# Patient Record
Sex: Male | Born: 1935 | Race: White | Hispanic: No | Marital: Married | Smoking: Former smoker
Health system: Southern US, Community
[De-identification: ages and names within clinical notes are randomized; demographics above are authoritative.]

## PROBLEM LIST (undated history)

## (undated) DIAGNOSIS — N183 Chronic kidney disease, stage 3 unspecified: Secondary | ICD-10-CM

## (undated) DIAGNOSIS — I509 Heart failure, unspecified: Secondary | ICD-10-CM

## (undated) DIAGNOSIS — I219 Acute myocardial infarction, unspecified: Secondary | ICD-10-CM

## (undated) DIAGNOSIS — E785 Hyperlipidemia, unspecified: Secondary | ICD-10-CM

## (undated) DIAGNOSIS — Z9981 Dependence on supplemental oxygen: Secondary | ICD-10-CM

## (undated) DIAGNOSIS — I2510 Atherosclerotic heart disease of native coronary artery without angina pectoris: Secondary | ICD-10-CM

## (undated) DIAGNOSIS — Z86010 Personal history of colonic polyps: Secondary | ICD-10-CM

## (undated) DIAGNOSIS — F419 Anxiety disorder, unspecified: Secondary | ICD-10-CM

## (undated) DIAGNOSIS — J449 Chronic obstructive pulmonary disease, unspecified: Secondary | ICD-10-CM

## (undated) DIAGNOSIS — Z8711 Personal history of peptic ulcer disease: Secondary | ICD-10-CM

## (undated) DIAGNOSIS — N200 Calculus of kidney: Secondary | ICD-10-CM

## (undated) DIAGNOSIS — C329 Malignant neoplasm of larynx, unspecified: Secondary | ICD-10-CM

## (undated) DIAGNOSIS — Z8719 Personal history of other diseases of the digestive system: Secondary | ICD-10-CM

## (undated) DIAGNOSIS — J189 Pneumonia, unspecified organism: Secondary | ICD-10-CM

## (undated) DIAGNOSIS — J984 Other disorders of lung: Secondary | ICD-10-CM

## (undated) DIAGNOSIS — K219 Gastro-esophageal reflux disease without esophagitis: Secondary | ICD-10-CM

## (undated) DIAGNOSIS — K5730 Diverticulosis of large intestine without perforation or abscess without bleeding: Secondary | ICD-10-CM

## (undated) DIAGNOSIS — I10 Essential (primary) hypertension: Secondary | ICD-10-CM

## (undated) DIAGNOSIS — Z800 Family history of malignant neoplasm of digestive organs: Secondary | ICD-10-CM

## (undated) DIAGNOSIS — K900 Celiac disease: Secondary | ICD-10-CM

## (undated) DIAGNOSIS — Z8489 Family history of other specified conditions: Secondary | ICD-10-CM

## (undated) HISTORY — DX: Chronic obstructive pulmonary disease, unspecified: J44.9

## (undated) HISTORY — PX: EPIGLOTOPLASTY W/ MLB: SHX1519

## (undated) HISTORY — PX: VASECTOMY: SHX75

## (undated) HISTORY — DX: Diverticulosis of large intestine without perforation or abscess without bleeding: K57.30

## (undated) HISTORY — DX: Essential (primary) hypertension: I10

## (undated) HISTORY — DX: Family history of malignant neoplasm of digestive organs: Z80.0

## (undated) HISTORY — DX: Gastro-esophageal reflux disease without esophagitis: K21.9

## (undated) HISTORY — DX: Celiac disease: K90.0

## (undated) HISTORY — PX: TONSILLECTOMY AND ADENOIDECTOMY: SUR1326

## (undated) HISTORY — DX: Other disorders of lung: J98.4

## (undated) HISTORY — DX: Personal history of colonic polyps: Z86.010

## (undated) HISTORY — DX: Hyperlipidemia, unspecified: E78.5

## (undated) HISTORY — DX: Atherosclerotic heart disease of native coronary artery without angina pectoris: I25.10

## (undated) HISTORY — PX: EXCISIONAL HEMORRHOIDECTOMY: SHX1541

---
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
## 1998-10-09 ENCOUNTER — Encounter: Payer: Self-pay | Admitting: Emergency Medicine

## 1998-10-09 ENCOUNTER — Ambulatory Visit: Admission: RE | Admit: 1998-10-09 | Discharge: 1998-10-09 | Payer: Self-pay | Admitting: Emergency Medicine

## 1998-10-09 ENCOUNTER — Emergency Department (HOSPITAL_COMMUNITY): Admission: EM | Admit: 1998-10-09 | Discharge: 1998-10-09 | Payer: Self-pay | Admitting: Emergency Medicine

## 1999-06-05 ENCOUNTER — Emergency Department (HOSPITAL_COMMUNITY): Admission: EM | Admit: 1999-06-05 | Discharge: 1999-06-05 | Payer: Self-pay | Admitting: Emergency Medicine

## 1999-09-27 ENCOUNTER — Encounter: Payer: Self-pay | Admitting: Emergency Medicine

## 1999-09-27 ENCOUNTER — Inpatient Hospital Stay (HOSPITAL_COMMUNITY): Admission: EM | Admit: 1999-09-27 | Discharge: 1999-09-29 | Payer: Self-pay | Admitting: Emergency Medicine

## 1999-09-30 ENCOUNTER — Inpatient Hospital Stay (HOSPITAL_COMMUNITY): Admission: EM | Admit: 1999-09-30 | Discharge: 1999-10-01 | Payer: Self-pay | Admitting: Emergency Medicine

## 2001-05-04 ENCOUNTER — Ambulatory Visit (HOSPITAL_COMMUNITY): Admission: RE | Admit: 2001-05-04 | Discharge: 2001-05-04 | Payer: Self-pay | Admitting: Cardiology

## 2001-05-04 HISTORY — PX: CARDIAC CATHETERIZATION: SHX172

## 2004-05-06 ENCOUNTER — Ambulatory Visit (HOSPITAL_COMMUNITY): Admission: RE | Admit: 2004-05-06 | Discharge: 2004-05-06 | Payer: Self-pay | Admitting: Surgery

## 2004-05-06 ENCOUNTER — Encounter (INDEPENDENT_AMBULATORY_CARE_PROVIDER_SITE_OTHER): Payer: Self-pay | Admitting: Specialist

## 2004-07-20 ENCOUNTER — Ambulatory Visit: Payer: Self-pay | Admitting: Pulmonary Disease

## 2004-07-26 ENCOUNTER — Emergency Department (HOSPITAL_COMMUNITY): Admission: EM | Admit: 2004-07-26 | Discharge: 2004-07-26 | Payer: Self-pay | Admitting: Emergency Medicine

## 2004-07-28 ENCOUNTER — Ambulatory Visit: Payer: Self-pay | Admitting: Pulmonary Disease

## 2004-08-04 ENCOUNTER — Ambulatory Visit: Payer: Self-pay | Admitting: Pulmonary Disease

## 2004-08-19 ENCOUNTER — Ambulatory Visit: Payer: Self-pay | Admitting: Pulmonary Disease

## 2004-09-03 ENCOUNTER — Ambulatory Visit: Payer: Self-pay | Admitting: Pulmonary Disease

## 2004-11-12 ENCOUNTER — Ambulatory Visit: Payer: Self-pay | Admitting: Pulmonary Disease

## 2004-12-02 ENCOUNTER — Ambulatory Visit: Payer: Self-pay | Admitting: Internal Medicine

## 2004-12-07 ENCOUNTER — Ambulatory Visit: Payer: Self-pay | Admitting: Internal Medicine

## 2005-02-10 ENCOUNTER — Ambulatory Visit: Payer: Self-pay | Admitting: Pulmonary Disease

## 2005-04-19 ENCOUNTER — Ambulatory Visit: Payer: Self-pay | Admitting: Internal Medicine

## 2005-05-13 ENCOUNTER — Ambulatory Visit: Payer: Self-pay | Admitting: Pulmonary Disease

## 2005-05-17 ENCOUNTER — Emergency Department (HOSPITAL_COMMUNITY): Admission: EM | Admit: 2005-05-17 | Discharge: 2005-05-18 | Payer: Self-pay | Admitting: *Deleted

## 2005-06-03 ENCOUNTER — Ambulatory Visit: Payer: Self-pay | Admitting: Internal Medicine

## 2005-08-13 ENCOUNTER — Ambulatory Visit: Payer: Self-pay | Admitting: Pulmonary Disease

## 2005-11-01 ENCOUNTER — Ambulatory Visit: Payer: Self-pay | Admitting: Internal Medicine

## 2005-11-03 ENCOUNTER — Ambulatory Visit: Payer: Self-pay | Admitting: Pulmonary Disease

## 2005-11-12 ENCOUNTER — Ambulatory Visit: Payer: Self-pay | Admitting: Pulmonary Disease

## 2005-11-23 ENCOUNTER — Ambulatory Visit: Payer: Self-pay | Admitting: Internal Medicine

## 2006-01-06 ENCOUNTER — Ambulatory Visit: Payer: Self-pay | Admitting: Pulmonary Disease

## 2006-04-06 ENCOUNTER — Ambulatory Visit: Payer: Self-pay | Admitting: Pulmonary Disease

## 2006-04-21 ENCOUNTER — Ambulatory Visit: Payer: Self-pay | Admitting: Pulmonary Disease

## 2006-05-25 ENCOUNTER — Emergency Department (HOSPITAL_COMMUNITY): Admission: EM | Admit: 2006-05-25 | Discharge: 2006-05-25 | Payer: Self-pay | Admitting: Emergency Medicine

## 2006-07-07 ENCOUNTER — Ambulatory Visit: Payer: Self-pay | Admitting: Pulmonary Disease

## 2006-07-11 ENCOUNTER — Ambulatory Visit: Payer: Self-pay | Admitting: Pulmonary Disease

## 2006-09-01 ENCOUNTER — Ambulatory Visit: Payer: Self-pay | Admitting: Internal Medicine

## 2006-09-01 LAB — CONVERTED CEMR LAB
Bacteria, U Microscopic: NEGATIVE /hpf
Epithelial cells, urine: NEGATIVE /lpf
Hemoglobin, Urine: NEGATIVE
Mucus, UA: NEGATIVE
Total Protein, Urine: NEGATIVE mg/dL
pH: 6 (ref 5.0–8.0)

## 2006-09-09 ENCOUNTER — Ambulatory Visit: Payer: Self-pay | Admitting: Internal Medicine

## 2006-09-09 LAB — CONVERTED CEMR LAB
AST: 19 units/L (ref 0–37)
Bilirubin, Direct: 0.2 mg/dL (ref 0.0–0.3)
Cholesterol: 152 mg/dL (ref 0–200)
HDL: 40.2 mg/dL (ref >39.0)
Total Bilirubin: 1.2 mg/dL (ref 0.3–1.2)
Total Protein: 6.2 g/dL (ref 6.0–8.3)
Triglycerides: 105 mg/dL (ref 0–149)
VLDL: 21 mg/dL (ref 0–40)

## 2006-10-06 ENCOUNTER — Ambulatory Visit: Payer: Self-pay | Admitting: Pulmonary Disease

## 2006-10-07 ENCOUNTER — Ambulatory Visit: Payer: Self-pay | Admitting: Cardiology

## 2006-11-08 ENCOUNTER — Ambulatory Visit: Payer: Self-pay | Admitting: Pulmonary Disease

## 2006-11-17 ENCOUNTER — Ambulatory Visit: Payer: Self-pay | Admitting: Pulmonary Disease

## 2006-11-24 ENCOUNTER — Ambulatory Visit: Payer: Self-pay | Admitting: Pulmonary Disease

## 2007-01-05 ENCOUNTER — Ambulatory Visit: Payer: Self-pay | Admitting: Internal Medicine

## 2007-01-06 ENCOUNTER — Ambulatory Visit: Payer: Self-pay | Admitting: Pulmonary Disease

## 2007-03-30 ENCOUNTER — Ambulatory Visit: Payer: Self-pay | Admitting: Emergency Medicine

## 2007-04-13 ENCOUNTER — Ambulatory Visit: Payer: Self-pay | Admitting: Gastroenterology

## 2007-04-26 ENCOUNTER — Encounter: Payer: Self-pay | Admitting: Gastroenterology

## 2007-04-26 ENCOUNTER — Ambulatory Visit: Payer: Self-pay | Admitting: Gastroenterology

## 2007-04-26 DIAGNOSIS — Z860100 Personal history of colon polyps, unspecified: Secondary | ICD-10-CM

## 2007-04-26 HISTORY — DX: Personal history of colonic polyps: Z86.010

## 2007-04-26 HISTORY — DX: Personal history of colon polyps, unspecified: Z86.0100

## 2007-05-19 ENCOUNTER — Ambulatory Visit: Payer: Self-pay | Admitting: Pulmonary Disease

## 2007-07-06 DIAGNOSIS — J449 Chronic obstructive pulmonary disease, unspecified: Secondary | ICD-10-CM

## 2007-07-06 DIAGNOSIS — J45909 Unspecified asthma, uncomplicated: Secondary | ICD-10-CM | POA: Insufficient documentation

## 2007-07-06 DIAGNOSIS — K219 Gastro-esophageal reflux disease without esophagitis: Secondary | ICD-10-CM

## 2007-08-11 ENCOUNTER — Ambulatory Visit: Payer: Self-pay | Admitting: Emergency Medicine

## 2007-08-11 DIAGNOSIS — R918 Other nonspecific abnormal finding of lung field: Secondary | ICD-10-CM

## 2007-08-11 DIAGNOSIS — B370 Candidal stomatitis: Secondary | ICD-10-CM

## 2007-08-11 DIAGNOSIS — R059 Cough, unspecified: Secondary | ICD-10-CM | POA: Insufficient documentation

## 2007-08-11 DIAGNOSIS — R05 Cough: Secondary | ICD-10-CM

## 2007-09-05 ENCOUNTER — Encounter: Payer: Self-pay | Admitting: Internal Medicine

## 2007-09-18 ENCOUNTER — Ambulatory Visit: Payer: Self-pay | Admitting: Emergency Medicine

## 2007-09-18 DIAGNOSIS — J019 Acute sinusitis, unspecified: Secondary | ICD-10-CM

## 2007-09-29 ENCOUNTER — Encounter: Payer: Self-pay | Admitting: Emergency Medicine

## 2007-10-05 ENCOUNTER — Ambulatory Visit: Payer: Self-pay | Admitting: Emergency Medicine

## 2007-10-05 DIAGNOSIS — T7841XA Arthus phenomenon, initial encounter: Secondary | ICD-10-CM | POA: Insufficient documentation

## 2007-10-05 LAB — CONVERTED CEMR LAB
BUN: 16 mg/dL (ref 6–23)
Creatinine, Ser: 1.1 mg/dL (ref 0.4–1.5)
GFR calc Af Amer: 85 mL/min
GFR calc non Af Amer: 70 mL/min
Potassium: 4 meq/L (ref 3.5–5.1)
Sodium: 141 meq/L (ref 135–145)

## 2007-10-09 ENCOUNTER — Ambulatory Visit: Payer: Self-pay | Admitting: Cardiovascular Disease

## 2007-10-17 ENCOUNTER — Telehealth (INDEPENDENT_AMBULATORY_CARE_PROVIDER_SITE_OTHER): Payer: Self-pay | Admitting: *Deleted

## 2007-10-30 ENCOUNTER — Ambulatory Visit: Payer: Self-pay | Admitting: Emergency Medicine

## 2007-10-30 DIAGNOSIS — J441 Chronic obstructive pulmonary disease with (acute) exacerbation: Secondary | ICD-10-CM | POA: Insufficient documentation

## 2007-12-11 ENCOUNTER — Ambulatory Visit: Payer: Self-pay | Admitting: Emergency Medicine

## 2008-03-07 ENCOUNTER — Telehealth: Payer: Self-pay | Admitting: Emergency Medicine

## 2008-04-01 ENCOUNTER — Ambulatory Visit: Payer: Self-pay | Admitting: Emergency Medicine

## 2008-04-01 LAB — CONVERTED CEMR LAB
Calcium: 9.1 mg/dL (ref 8.4–10.5)
GFR calc Af Amer: 85 mL/min
GFR calc non Af Amer: 70 mL/min
Potassium: 4.2 meq/L (ref 3.5–5.1)
Sodium: 144 meq/L (ref 135–145)

## 2008-04-05 ENCOUNTER — Ambulatory Visit: Payer: Self-pay | Admitting: Cardiovascular Disease

## 2008-04-10 ENCOUNTER — Ambulatory Visit: Payer: Self-pay | Admitting: Emergency Medicine

## 2008-05-06 ENCOUNTER — Telehealth (INDEPENDENT_AMBULATORY_CARE_PROVIDER_SITE_OTHER): Payer: Self-pay | Admitting: *Deleted

## 2008-05-20 ENCOUNTER — Ambulatory Visit: Payer: Self-pay | Admitting: Internal Medicine

## 2008-05-22 ENCOUNTER — Telehealth: Payer: Self-pay | Admitting: Emergency Medicine

## 2008-05-23 ENCOUNTER — Telehealth: Payer: Self-pay | Admitting: Emergency Medicine

## 2008-06-07 ENCOUNTER — Ambulatory Visit: Payer: Self-pay | Admitting: Emergency Medicine

## 2008-07-30 ENCOUNTER — Ambulatory Visit: Payer: Self-pay | Admitting: Emergency Medicine

## 2008-09-30 ENCOUNTER — Ambulatory Visit: Payer: Self-pay | Admitting: Emergency Medicine

## 2008-09-30 LAB — CONVERTED CEMR LAB
Chloride: 102 meq/L (ref 96–112)
GFR calc Af Amer: 77 mL/min
GFR calc non Af Amer: 63 mL/min
Potassium: 4.4 meq/L (ref 3.5–5.1)

## 2008-10-07 ENCOUNTER — Ambulatory Visit: Payer: Self-pay | Admitting: Cardiology

## 2009-01-15 ENCOUNTER — Ambulatory Visit: Payer: Self-pay | Admitting: Emergency Medicine

## 2009-02-10 ENCOUNTER — Telehealth (INDEPENDENT_AMBULATORY_CARE_PROVIDER_SITE_OTHER): Payer: Self-pay | Admitting: *Deleted

## 2009-04-14 ENCOUNTER — Telehealth: Payer: Self-pay | Admitting: Emergency Medicine

## 2009-04-15 ENCOUNTER — Telehealth (INDEPENDENT_AMBULATORY_CARE_PROVIDER_SITE_OTHER): Payer: Self-pay | Admitting: *Deleted

## 2009-07-07 ENCOUNTER — Ambulatory Visit: Payer: Self-pay | Admitting: Emergency Medicine

## 2009-08-04 ENCOUNTER — Ambulatory Visit: Payer: Self-pay | Admitting: Internal Medicine

## 2009-09-10 ENCOUNTER — Ambulatory Visit: Payer: Self-pay | Admitting: Emergency Medicine

## 2009-09-10 ENCOUNTER — Encounter: Payer: Self-pay | Admitting: Emergency Medicine

## 2009-09-10 DIAGNOSIS — J329 Chronic sinusitis, unspecified: Secondary | ICD-10-CM | POA: Insufficient documentation

## 2009-09-17 ENCOUNTER — Telehealth (INDEPENDENT_AMBULATORY_CARE_PROVIDER_SITE_OTHER): Payer: Self-pay | Admitting: *Deleted

## 2009-09-26 ENCOUNTER — Telehealth (INDEPENDENT_AMBULATORY_CARE_PROVIDER_SITE_OTHER): Payer: Self-pay | Admitting: *Deleted

## 2009-09-30 HISTORY — PX: CARDIAC CATHETERIZATION: SHX172

## 2009-10-10 ENCOUNTER — Ambulatory Visit: Payer: Self-pay | Admitting: Emergency Medicine

## 2009-10-23 ENCOUNTER — Telehealth (INDEPENDENT_AMBULATORY_CARE_PROVIDER_SITE_OTHER): Payer: Self-pay | Admitting: *Deleted

## 2010-01-26 ENCOUNTER — Ambulatory Visit: Payer: Self-pay | Admitting: Emergency Medicine

## 2010-03-13 ENCOUNTER — Ambulatory Visit: Payer: Self-pay | Admitting: Pulmonary Disease

## 2010-04-03 ENCOUNTER — Ambulatory Visit: Payer: Self-pay | Admitting: Emergency Medicine

## 2010-07-22 ENCOUNTER — Ambulatory Visit: Payer: Self-pay | Admitting: Emergency Medicine

## 2010-08-04 ENCOUNTER — Inpatient Hospital Stay (HOSPITAL_COMMUNITY)
Admission: EM | Admit: 2010-08-04 | Discharge: 2010-08-07 | Payer: Self-pay | Source: Home / Self Care | Attending: Internal Medicine | Admitting: Internal Medicine

## 2010-08-07 ENCOUNTER — Encounter (INDEPENDENT_AMBULATORY_CARE_PROVIDER_SITE_OTHER): Payer: Self-pay | Admitting: Internal Medicine

## 2010-08-19 ENCOUNTER — Ambulatory Visit
Admission: RE | Admit: 2010-08-19 | Discharge: 2010-08-19 | Payer: Self-pay | Source: Home / Self Care | Attending: Emergency Medicine | Admitting: Emergency Medicine

## 2010-08-19 ENCOUNTER — Ambulatory Visit: Payer: Self-pay | Admitting: Emergency Medicine

## 2010-09-08 ENCOUNTER — Ambulatory Visit
Admission: RE | Admit: 2010-09-08 | Discharge: 2010-09-08 | Payer: Self-pay | Source: Home / Self Care | Attending: Emergency Medicine | Admitting: Emergency Medicine

## 2010-09-13 ENCOUNTER — Encounter: Payer: Self-pay | Admitting: Emergency Medicine

## 2010-09-24 NOTE — Progress Notes (Signed)
Summary: results  Phone Note Call from Patient Call back at [phone]   Caller: Patient Call For: byrum Summary of Call: xray results Initial call taken by: Gustavus Bryant,  September 17, 2009 8:33 AM  Follow-up for Phone Call        Dr. Lamonte Sakai, please advise on CXR for 09/10/09. thanks.Choccolocco Bing CMA  September 17, 2009 9:00 AM   Pt called back...would like results.  Said no one called him back on 09/17/2009. Follow-up by: Zigmund Gottron,  September 18, 2009 8:21 AM  Additional Follow-up for Phone Call Additional follow up Details #1::        The patient is aware RB is out of the office. The patient does state that he is feeling better and would just like to know what the cxr showed. The patient was instructed to continue medication regimen that RB layed out for him and we will call as soon as we can get cxr results from [facility]. The patient had verbal understanding of this. Additional Follow-up by: Francesca Jewett CMA,  September 18, 2009 9:19 AM    Additional Follow-up for Phone Call Additional follow up Details #2::    pt still have not gotten results from xray  CXR is stable from last time, no changes. Collene Gobble MD  September 25, 2009 5:00 PM   Spoke with pt and advised results/recs per RB.  Pt verbalized understanding. Follow-up by: Tilden Dome,  September 25, 2009 5:17 PM

## 2010-09-24 NOTE — Assessment & Plan Note (Signed)
Summary: COPD, sinusitis, cough   Visit Type:  Follow-up Primary Provider/Referring Provider:  Dr Nino Glow  CC:  Patient is here for sinusitis follow-up. He was seen by Dr. Halford Chessman on 03/13/2010.  The patient says his sinuses are better and he can breathe. No change in his cough. Patient is on Doxy for dermatitis by PCP. Marland Kitchen  History of Present Illness: [name] is a 76 year old gentleman with a history of COPD, remote laryngeal cancer with some dysphagia, allergic rhinitis, and esophageal reflux. He has also been followed for chronic nodular granulomatous disease and bronchiectasis on CT scan chest, stable on CT for 2 + yrs.    ROV 09/10/09 -- Seen a month ago for acute bronchitis, had also been treated in Nov for prod cough, treated w abx. Was taking [place] but gave up on them. Still with colored mucous, green with blood streaks. Uses SABA rarely.   ROV 10/10/09 -- Returns for f/u. Finishing Augmentin today (4 week course), doing nasal saline washes, clarinex, mucinex. Breathing well, using Advair two times a day. Nasal gtt better, cough better.   January 26, 2010--Presents for an acute office visit. Complains of prod cough with green/yellow/brown mucus, wheezing, increased SOB x10days . OTC not helping. Denies chest pain, orthopnea, hemoptysis, fever, n/v/d, edema, headache.   March 13, 2010 -- symptoms for couple weeks, cough with sputum, worse at night, hoarse, gurgle in throat/upper chest, creamy sputum, no hemoptysis, sinuses bad, no fever, occ wheeze, nasonex at bedtime, proair one per week, sinus solution once daily, clarinex once daily.  ROV 04/03/10 -- returns for f/u COPD, allergic rhinitis, GERD, hx laryngeal CA. Recent treatment for acute sinusitis with Augmentin, also added atrovent NS to his other allergy regimen. He feels better, HA improved, mucous is now clear. Breathing has been good. Recently started on doxycycline to treat a dermatitis.   Current Medications (verified): 1)  Advair  Diskus 250-50 Mcg/dose  Misc (Fluticasone-Salmeterol) .... Inhale 1 Puff Two Times A Day 2)  Singulair 10 Mg  Tabs (Montelukast Sodium) .... Take 1 Tablet By Mouth Once A Day 3)  Proair Hfa 108 (90 Base) Mcg/act Aers (Albuterol Sulfate) .... Inhale 2 Puffs Every 4 Hours As Needed 4)  Nasonex 50 Mcg/act  Susp (Mometasone Furoate) .... As Needed 5)  Adult Aspirin Low Strength 81 Mg  Tbdp (Aspirin) .... Take 1 Tablet By Mouth Once A Day 6)  Vitamin C 1000 Mg  Tabs (Ascorbic Acid) .... Take 1 Tablet By Mouth Twice A Day 7)  Prevacid 24hr 15 Mg Cpdr (Lansoprazole) .Marland Kitchen.. 1 By Mouth Two Times A Day 8)  Fish Oil 1000 Mg  Caps (Omega-3 Fatty Acids) .... Take 1 Tablet By Mouth Twice A Day 9)  Diovan 80 Mg Tabs (Valsartan) .Marland Kitchen.. 1 By Mouth Once Daily 10)  Crestor 20 Mg  Tabs (Rosuvastatin Calcium) .... Take 1 Tablet By Mouth Once A Day 11)  Mucinex Dm 30-600 Mg  Tb12 (Dextromethorphan-Guaifenesin) .Marland Kitchen.. 1-2 Tabs Every 12 Hours As Needed 12)  Clarinex 5 Mg Tabs (Desloratadine) .... Once Daily 13)  Hydromet 5-1.5 Mg/9m Syrp (Hydrocodone-Homatropine) ..Marland Kitchen. 1-2 Tsp Every 4-6 Hr As Needed Cough 14)  Atrovent 0.03 % Soln (Ipratropium Bromide) .... 2 Sprays Each Nostril Two Times A Day As Needed 15)  Doxycycline Hyclate 100 Mg Tabs (Doxycycline Hyclate) ..Marland Kitchen. 1 By Mouth Two Times A Day X 10 Days  Allergies (verified): 1)  ! Codeine  Vital Signs:  Patient profile:   76year old male Height:  67 inches (170.18 cm) Weight:      145 pounds (65.91 kg) BMI:     22.79 O2 Sat:      91 % on Room air Temp:     97.5 degrees F (36.39 degrees C) oral Pulse rate:   93 / minute BP sitting:   110 / 76  (left arm) Cuff size:   regular  Vitals Entered By: Francesca Jewett CMA (April 03, 2010 9:56 AM)  O2 Sat at Rest %:  91 O2 Flow:  Room air CC: Patient is here for sinusitis follow-up. He was seen by Dr. Halford Chessman on 03/13/2010.  The patient says his sinuses are better and he can breathe. No change in his cough. Patient is on  Doxy for dermatitis by PCP.  Comments Medications reviewed with the patient. Daytime phone verified. Francesca Jewett CMA  April 03, 2010 9:56 AM   Physical Exam  General:  well developed, well nourished, in no acute distress Head:  normocephalic and atraumatic Nose:  clear nasal discharge, no sinus tenderness Mouth:  mild erythema Neck:  no JVD.   Chest Wall:  no deformities noted Lungs:  Clear, no wheeze Heart:  regular rate and rhythm, S1, S2 without murmurs, rubs, gallops, or clicks Abdomen:  not examined Msk:  no deformity or scoliosis noted with normal posture Extremities:  no clubbing, cyanosis, edema, or deformity noted Neurologic:  nonfocal Cervical Nodes:  no significant adenopathy Psych:  alert and cooperative; normal mood and affect; normal attention span and concentration   Impression & Recommendations:  Problem # 1:  COPD UNSPECIFIED (ICD-496) - Advair two times a day - ProAir as needed   Problem # 2:  ACUTE SINUSITIS, UNSPECIFIED (ICD-461.9)  Improved - continue allergy regimen  Orders: Est. Patient Level IV (72536)  Medications Added to Medication List This Visit: 1)  Doxycycline Hyclate 100 Mg Tabs (Doxycycline hyclate) .Marland Kitchen.. 1 by mouth two times a day x 10 days  Patient Instructions: 1)  Please continue your Advair two times a day and ProAir as needed  2)  Continue your clarinex once daily, nasonex once daily as needed, nasal saline washes once daily  3)  Atrovent nasal spray as needed  4)  Follow up in 3 months or as needed

## 2010-09-24 NOTE — Assessment & Plan Note (Signed)
Summary: Bruce Travis pt/gurgling, choking at night, gets up really bad cough...   Visit Type:  Acute visit Primary Provider/Referring Provider:  Dr Nino Glow  CC:  The patient c/o increased mucus production with cough at night when lying down. Mucus is creamy. Cough and sob during the day. Dr. Agustina Caroli patient..  History of Present Illness: Bruce Travis is a 76 year old gentleman with a history of COPD, remote laryngeal cancer with some dysphagia, allergic rhinitis, and esophageal reflux. He has also been followed for chronic nodular granulomatous disease and bronchiectasis on CT scan chest, stable on CT for 2 + yrs.    August 04, 2009--Presents for an acute office viist. Complains of increased SOB, prod cough with yellow/green/creamy colored mucus, wheezing x1day - Last visit w/ COPD flare , tx w/ Doxycyline x 7 days. He got better  but not has it again. OTC not helping. Worse last night. Denies chest pain,  orthopnea, hemoptysis, fever, n/v/d, edema, headache.   ROV 09/10/09 -- Seen a month ago for acute bronchitis, had also been treated in Nov for prod cough, treated w abx. Was taking [place] but gave up on them. Still with colored mucous, green with blood streaks. Uses SABA rarely.   ROV 10/10/09 -- Returns for f/u. Finishing Augmentin today (4 week course), doing nasal saline washes, clarinex, mucinex. Breathing well, using Advair two times a day. Nasal gtt better, cough better.   January 26, 2010--Presents for an acute office visit. Complains of prod cough with green/yellow/brown mucus, wheezing, increased SOB x10days . OTC not helping. Denies chest pain, orthopnea, hemoptysis, fever, n/v/d, edema, headache.   March 13, 2010 -- symptoms for couple weeks, cough with sputum, worse at night, hoarse, gurgle in throat/upper chest, creamy sputum, no hemoptysis, sinuses bad, no fever, occ wheeze, nasonex at bedtime, proair one per week, sinus solution once daily, clarinex once daily.  Current Medications  (verified): 1)  Adult Aspirin Low Strength 81 Mg  Tbdp (Aspirin) .... Take 1 Tablet By Mouth Once A Day 2)  Advair Diskus 250-50 Mcg/dose  Misc (Fluticasone-Salmeterol) .... Inhale 1 Puff Two Times A Day 3)  Vitamin C 1000 Mg  Tabs (Ascorbic Acid) .... Take 1 Tablet By Mouth Twice A Day 4)  Prevacid 24hr 15 Mg Cpdr (Lansoprazole) .Marland Kitchen.. 1 By Mouth Two Times A Day 5)  Fish Oil 1000 Mg  Caps (Omega-3 Fatty Acids) .... Take 1 Tablet By Mouth Twice A Day 6)  Diovan 80 Mg Tabs (Valsartan) .Marland Kitchen.. 1 By Mouth Once Daily 7)  Singulair 10 Mg  Tabs (Montelukast Sodium) .... Take 1 Tablet By Mouth Once A Day 8)  Crestor 20 Mg  Tabs (Rosuvastatin Calcium) .... Take 1 Tablet By Mouth Once A Day 9)  Nasonex 50 Mcg/act  Susp (Mometasone Furoate) .... As Needed 10)  Proair Hfa 108 (90 Base) Mcg/act Aers (Albuterol Sulfate) .... Inhale 2 Puffs Every 4 Hours As Needed 11)  Mucinex Dm 30-600 Mg  Tb12 (Dextromethorphan-Guaifenesin) .Marland Kitchen.. 1-2 Tabs Every 12 Hours As Needed 12)  Clarinex 5 Mg Tabs (Desloratadine) .... Once Daily 13)  Hydromet 5-1.5 Mg/57m Syrp (Hydrocodone-Homatropine) ..Marland Kitchen. 1-2 Tsp Every 4-6 Hr As Needed Cough  Allergies (verified): 1)  ! Codeine  Past History:  Past Medical History: Reviewed history from 10/30/2007 and no changes required.  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) ACUTE SINUSITIS, UNSPECIFIED (ICD-461.9) COUGH (ICD-786.2) COPD UNSPECIFIED (ICD-496) PULMONARY NODULE (ICD-518.89) CHRONIC RHINITIS (ICD-472.0) GERD (ICD-530.81) ASTHMA (ICD-493.90) CANDIDIASIS, ORAL (ICD-112.0)    Vital Signs:  Patient profile:   75  year old male Height:      67 inches (170.18 cm) Weight:      150 pounds (68.18 kg) BMI:     23.58 O2 Sat:      90 % on Room air Temp:     97.7 degrees F (36.50 degrees C) oral Pulse rate:   84 / minute BP sitting:   120 / 74  (right arm) Cuff size:   regular  Vitals Entered By: Francesca Jewett CMA (March 13, 2010 9:35 AM)  O2 Sat at Rest %:  90 O2 Flow:  Room air CC:  The patient c/o increased mucus production with cough at night when lying down. Mucus is creamy. Cough and sob during the day. Dr. Agustina Caroli patient. Comments Medications reviewed with the patient. Daytime phone verified. Francesca Jewett CMA  March 13, 2010 9:35 AM   Physical Exam  General:  well developed, well nourished, in no acute distress Nose:  clear nasal discharge, mild maxillary sinus tenderness Mouth:  mild erythema Neck:  no JVD.   Lungs:  Clear, no wheeze Heart:  regular rate and rhythm, S1, S2 without murmurs, rubs, gallops, or clicks Extremities:  no clubbing, cyanosis, edema, or deformity noted Cervical Nodes:  no significant adenopathy   Impression & Recommendations:  Problem # 1:  ACUTE SINUSITIS, UNSPECIFIED (ICD-461.9)  He has persistent sinus symptoms.  He had initial improvement with antibiotics, but his symptoms recurred once antibiotics were stopped.  Will give him another course of antibiotics.  He is to continue with is other sinus regimen, and I will add atrovent nasal spray as needed.  Depending on his response he may need imaging studies of his sinuses.  Orders: Est. Patient Level IV (82956)  Medications Added to Medication List This Visit: 1)  Augmentin 875-125 Mg Tabs (Amoxicillin-pot clavulanate) .... One by mouth two times a day 2)  Atrovent 0.03 % Soln (Ipratropium bromide) .... 2 sprays each nostril two times a day as needed  Complete Medication List: 1)  Advair Diskus 250-50 Mcg/dose Misc (Fluticasone-salmeterol) .... Inhale 1 puff two times a day 2)  Singulair 10 Mg Tabs (Montelukast sodium) .... Take 1 tablet by mouth once a day 3)  Proair Hfa 108 (90 Base) Mcg/act Aers (Albuterol sulfate) .... Inhale 2 puffs every 4 hours as needed 4)  Nasonex 50 Mcg/act Susp (Mometasone furoate) .... As needed 5)  Adult Aspirin Low Strength 81 Mg Tbdp (Aspirin) .... Take 1 tablet by mouth once a day 6)  Vitamin C 1000 Mg Tabs (Ascorbic acid) .... Take 1 tablet by  mouth twice a day 7)  Prevacid 24hr 15 Mg Cpdr (Lansoprazole) .Marland Kitchen.. 1 by mouth two times a day 8)  Fish Oil 1000 Mg Caps (Omega-3 fatty acids) .... Take 1 tablet by mouth twice a day 9)  Diovan 80 Mg Tabs (Valsartan) .Marland Kitchen.. 1 by mouth once daily 10)  Crestor 20 Mg Tabs (Rosuvastatin calcium) .... Take 1 tablet by mouth once a day 11)  Mucinex Dm 30-600 Mg Tb12 (Dextromethorphan-guaifenesin) .Marland Kitchen.. 1-2 tabs every 12 hours as needed 12)  Clarinex 5 Mg Tabs (Desloratadine) .... Once daily 13)  Hydromet 5-1.5 Mg/22m Syrp (Hydrocodone-homatropine) ..Marland Kitchen. 1-2 tsp every 4-6 hr as needed cough 14)  Augmentin 875-125 Mg Tabs (Amoxicillin-pot clavulanate) .... One by mouth two times a day 15)  Atrovent 0.03 % Soln (Ipratropium bromide) .... 2 sprays each nostril two times a day as needed  Patient Instructions: 1)  Augmentin one pill two times a day for 7  days.  If sinus symptoms not improved, then refill script for another week of antibiotics. 2)  Atrovent nasal spray two sprays two times a day as needed for runny nose 3)  Follow up with Dr. Lamonte Sakai on August 12 Prescriptions: ATROVENT 0.03 % SOLN (IPRATROPIUM BROMIDE) 2 sprays each nostril two times a day as needed  #1 x 2   Entered and Authorized by:   Chesley Mires MD   Signed by:   Chesley Mires MD on 03/13/2010   Method used:   Electronically to        [facility] [phone]* (retail)       [address], [place]  [address]       Ph: [phone]       Fax: [phone]   RxID:   [id number] AUGMENTIN 875-125 MG TABS (AMOXICILLIN-POT CLAVULANATE) one by mouth two times a day  #14 x 1   Entered and Authorized by:   Chesley Mires MD   Signed by:   Chesley Mires MD on 03/13/2010   Method used:   Electronically to        [facility] [phone]* (retail)       [address]       [place], [place]  [address]       Ph: [phone]       Fax: [phone]   RxID:   [id number]

## 2010-09-24 NOTE — Assessment & Plan Note (Signed)
Summary: Acute NP office visit - bronchitis   Primary Provider/Referring Provider:  Dr Nino Glow  CC:  prod cough with green/yellow/brown mucus, wheezing, and increased SOB x10days - denies f/c/s.  History of Present Illness: Bruce Travis is a 76 year old gentleman with a history of COPD, remote laryngeal cancer with some dysphagia, allergic rhinitis, and esophageal reflux. He has also been followed for chronic nodular granulomatous disease and bronchiectasis on CT scan chest, stable on CT for 2 + yrs.    August 04, 2009--Presents for an acute office viist. Complains of increased SOB, prod cough with yellow/green/creamy colored mucus, wheezing x1day - Last visit w/ COPD flare , tx w/ Doxycyline x 7 days. He got better  but not has it again. OTC not helping. Worse last night. Denies chest pain,  orthopnea, hemoptysis, fever, n/v/d, edema, headache.   ROV 09/10/09 -- Seen a month ago for acute bronchitis, had also been treated in Nov for prod cough, treated w abx. Was taking [place] but gave up on them. Still with colored mucous, green with blood streaks. Uses SABA rarely.   ROV 10/10/09 -- Returns for f/u. Finishing Augmentin today (4 week course), doing nasal saline washes, clarinex, mucinex. Breathing well, using Advair two times a day. Nasal gtt better, cough better.   January 26, 2010--Presents for an acute office visit. Complains of prod cough with green/yellow/brown mucus, wheezing, increased SOB x10days . OTC not helping. Denies chest pain, orthopnea, hemoptysis, fever, n/v/d, edema, headache.   Medications Prior to Update: 1)  Adult Aspirin Low Strength 81 Mg  Tbdp (Aspirin) .... Take 1 Tablet By Mouth Once A Day 2)  Advair Diskus 250-50 Mcg/dose  Misc (Fluticasone-Salmeterol) .... Inhale 1 Puff Two Times A Day 3)  Vitamin C 1000 Mg  Tabs (Ascorbic Acid) .... Take 1 Tablet By Mouth Twice A Day 4)  Prevacid 24hr 15 Mg Cpdr (Lansoprazole) .Marland Kitchen.. 1 By Mouth Two Times A Day 5)  Fish Oil 1000 Mg  Caps  (Omega-3 Fatty Acids) .... Take 1 Tablet By Mouth Twice A Day 6)  Diovan 80 Mg Tabs (Valsartan) .Marland Kitchen.. 1 By Mouth Once Daily 7)  Singulair 10 Mg  Tabs (Montelukast Sodium) .... Take 1 Tablet By Mouth Once A Day 8)  Crestor 20 Mg  Tabs (Rosuvastatin Calcium) .... Take 1 Tablet By Mouth Once A Day 9)  Nasonex 50 Mcg/act  Susp (Mometasone Furoate) .... As Needed 10)  Proair Hfa 108 (90 Base) Mcg/act Aers (Albuterol Sulfate) .... Inhale 2 Puffs Every 4 Hours As Needed 11)  Mucinex Dm 30-600 Mg  Tb12 (Dextromethorphan-Guaifenesin) .Marland Kitchen.. 1-2 Tabs Every 12 Hours As Needed 12)  Clarinex 5 Mg Tabs (Desloratadine) .... Once Daily 13)  Hydromet 5-1.5 Mg/75m Syrp (Hydrocodone-Homatropine) ..Marland Kitchen. 1-2 Tsp Every 4-6 Hr As Needed Cough 14)  Amoxicillin-Pot Clavulanate 875-125 Mg Tabs (Amoxicillin-Pot Clavulanate) ..Marland Kitchen. 1 By Mouth Two Times A Day 15)  Aciphex 20 Mg Tbec (Rabeprazole Sodium) ..Marland Kitchen. 1 By Mouth Once Daily  Current Medications (verified): 1)  Adult Aspirin Low Strength 81 Mg  Tbdp (Aspirin) .... Take 1 Tablet By Mouth Once A Day 2)  Advair Diskus 250-50 Mcg/dose  Misc (Fluticasone-Salmeterol) .... Inhale 1 Puff Two Times A Day 3)  Vitamin C 1000 Mg  Tabs (Ascorbic Acid) .... Take 1 Tablet By Mouth Twice A Day 4)  Prevacid 24hr 15 Mg Cpdr (Lansoprazole) ..Marland Kitchen. 1 By Mouth Two Times A Day 5)  Fish Oil 1000 Mg  Caps (Omega-3 Fatty Acids) .... Take 1 Tablet By  Mouth Twice A Day 6)  Diovan 80 Mg Tabs (Valsartan) .Marland Kitchen.. 1 By Mouth Once Daily 7)  Singulair 10 Mg  Tabs (Montelukast Sodium) .... Take 1 Tablet By Mouth Once A Day 8)  Crestor 20 Mg  Tabs (Rosuvastatin Calcium) .... Take 1 Tablet By Mouth Once A Day 9)  Nasonex 50 Mcg/act  Susp (Mometasone Furoate) .... As Needed 10)  Proair Hfa 108 (90 Base) Mcg/act Aers (Albuterol Sulfate) .... Inhale 2 Puffs Every 4 Hours As Needed 11)  Mucinex Dm 30-600 Mg  Tb12 (Dextromethorphan-Guaifenesin) .Marland Kitchen.. 1-2 Tabs Every 12 Hours As Needed 12)  Clarinex 5 Mg Tabs  (Desloratadine) .... Once Daily 13)  Hydromet 5-1.5 Mg/31m Syrp (Hydrocodone-Homatropine) ..Marland Kitchen. 1-2 Tsp Every 4-6 Hr As Needed Cough 14)  Aciphex 20 Mg Tbec (Rabeprazole Sodium) ..Marland Kitchen. 1 By Mouth Once Daily  Allergies (verified): 1)  ! 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 No Comments Medications reviewed with patient Daytime contact number verified with patient. JParke PoissonCNA/MA  January 26, 2010 3:44 PM    Physical Exam  Additional Exam:  GEN: A/Ox3; pleasant , NAD HEENT:  [place]/AT, , EACs-clear, TMs-wnl, NOSE-clear, THROAT-few scattered white patches, c/w thrush.  NECK:  Supple w/ fair ROM; no JVD; normal carotid impulses w/o bruits; no thyromegaly or nodules palpated; no lymphadenopathy. RESP  Coarse BS w/ few exp wheeizng.  CARD:  RRR, no m/r/g   GI:   Soft & nt; nml bowel sounds; no organomegaly or masses detected. Musco: Warm  bil,  no calf tenderness edema, clubbing, pulses intact Neuro:intact w/ no focal deficits noted.    Impression & Recommendations:  Problem # 1:  BRONCHITIS, OBSTRUCTIVE CHRONIC, ACUTE EXACERBATION (ICD-491.21) Flare REC:  Augmentin 878mtwo times a day for 7 days Prednisone taper over next week.  Mucinex DM two times a day as needed cough/congesiton  Saline nasal rinses as needed  Hold fish oil until cough is gone.  Mycelex troches 5x/days for 7 days for thrush.  Please contact office for sooner follow up if symptoms do not improve or worsen  Orders: Nebulizer Tx ([phone]. Patient Level IV ([phone] Medications Added to Medication List This Visit: 1)  Amoxicillin-pot Clavulanate 875-125 Mg Tabs (Amoxicillin-pot clavulanate) ...Marland Kitchen 1 by mouth two times a day 2)  Mycelex 10 Mg Troc (Clotrimazole) ...Marland Kitchen 1 five times a day for 7 days  for thrush 3)  Prednisone 10 Mg Tabs (Prednisone) .... 4 tabs for 2 days, then 3 tabs for 2 days, 2 tabs for 2 days, then 1 tab for 2 days, then stop 4)  Hydromet 5-1.5 Mg/24m Syrp (Hydrocodone-homatropine) ..Marland Kitchen. 1-2 tsp every 4-6 hr as needed cough  Complete Medication List: 1)  Adult Aspirin Low Strength 81 Mg Tbdp (Aspirin) .... Take 1 tablet by mouth once a day 2)  Advair Diskus 250-50 Mcg/dose Misc (Fluticasone-salmeterol) .... Inhale 1 puff two times a day 3)  Vitamin C 1000 Mg Tabs (Ascorbic acid) .... Take 1 tablet by mouth twice a day 4)  Prevacid 24hr 15 Mg Cpdr (Lansoprazole) ..Marland Kitchen. 1 by mouth two times a day 5)  Fish Oil 1000 Mg Caps (Omega-3 fatty acids) .... Take 1 tablet by mouth twice a day 6)  Diovan 80 Mg Tabs (Valsartan) ..Marland Kitchen. 1 by mouth once daily 7)  Singulair 10 Mg Tabs (Montelukast sodium) .... Take 1 tablet by mouth once a day 8)  Crestor 20 Mg Tabs (Rosuvastatin calcium) .... Take 1 tablet by mouth once a day 9)  Nasonex 50 Mcg/act Susp (Mometasone furoate) .... As needed 10)  Proair Hfa 108 (90 Base) Mcg/act Aers (Albuterol sulfate)  .... Inhale 2 puffs every 4 hours as needed 11)  Mucinex Dm 30-600 Mg Tb12 (Dextromethorphan-guaifenesin) ..Marland Kitchen. 1-2 tabs every 12 hours as needed 12)  Clarinex 5 Mg Tabs (Desloratadine) .... Once daily 13)  Hydromet 5-1.5 Mg/538mSyrp (Hydrocodone-homatropine) ...Marland Kitchen 1-2 tsp every 4-6 hr as needed cough 14)  Aciphex 20 Mg Tbec (Rabeprazole sodium) ...Marland Kitchen 1 by mouth once daily 15)  Amoxicillin-pot Clavulanate 875-125 Mg Tabs (Amoxicillin-pot clavulanate) ...Marland Kitchen 1 by mouth two times a day 16)  Mycelex 10 Mg Troc (Clotrimazole) ...Marland Kitchen 1 five times a day for 7 days for thrush 17)  Prednisone 10 Mg Tabs (Prednisone) .... 4 tabs for 2 days, then 3 tabs for 2 days, 2 tabs for 2 days, then 1 tab for 2 days, then stop 18)  Hydromet 5-1.5 Mg/15m30myrp (Hydrocodone-homatropine) ....Marland Kitchen1-2 tsp every 4-6 hr as needed cough  Patient Instructions: 1)  Augmentin 8715m77mo times a day for 7 days 2)  Prednisone taper over next week.  3)  Mucinex DM two times a day as needed cough/congesiton  4)  Saline nasal rinses as needed  5)  Hold fish oil until cough is gone.  6)  Mycelex troches 5x/days for 7 days for thrush.  7)  Please contact office for sooner follow up if symptoms do not improve or worsen  Prescriptions: HYDROMET 5-1.5 MG/5ML SYRP (HYDROCODONE-HOMATROPINE) 1-2 tsp every 4-6 hr as needed cough  #8 oz x 0   Entered and Authorized by:   TammRexene Edison  Signed by:   Eesa Justiss NP on 01/26/2010   Method used:   Print then Give to Patient   RxID:   [id number] 10 MG TABS (PREDNISONE) 4 tabs for 2 days, then 3 tabs for 2 days, 2 tabs for 2 days, then 1 tab for 2 days, then stop  #20 x 0   Entered and Authorized by:   TammRexene Edison  Signed by:   Roe Koffman NP on 01/26/2010   Method used:   [id number]       [address] [facility]  [place]  [address]       Ph: [phone]       Fax: [phone]   RxID:    [id number] MYCELEX 10 MG TROC (CLOTRIMAZOLE) 1 five times a day for 7 days for thrush  #35 x 0   Entered and Authorized by:   Rexene Edison NP   Signed by:   Lorie Melichar NP on 01/26/2010   Method used:   [facility]        [facility] [id number]       [address]       [place], [place]  [address]       Ph: [phone]       Fax: [phone]   RxID:   [id number] AMOXICILLIN-POT CLAVULANATE 875-125 MG TABS (AMOXICILLIN-POT CLAVULANATE) 1 by mouth two times a day  #14 x 0   Entered and Authorized by:   Rexene Edison NP   Signed by:   Mikel Pyon NP on 01/26/2010   Method used:   [facility]        [facility] [id number]       [address]       [place], [place]  [address]       Ph: [phone]       Fax: [phone]   RxID:   [id number]

## 2010-09-24 NOTE — Assessment & Plan Note (Signed)
Summary: NP follow up - post hosp   Primary Provider/Referring Provider:  Dr Nino Glow  CC:  post hosp follow up - states breathing has improved since discharge.  still prod cough with yellow/green mucus and some DOE.  History of Present Illness: Bruce Travis is a 76 year old gentleman with a history of COPD, remote laryngeal cancer with some dysphagia, allergic rhinitis, and esophageal reflux. He has also been followed for chronic nodular granulomatous disease and bronchiectasis on CT scan chest, stable on CT for 2 + yrs.    ROV 10/10/09 -- Returns for f/u. Finishing Augmentin today (4 week course), doing nasal saline washes, clarinex, mucinex. Breathing well, using Advair two times a day. Nasal gtt better, cough better.   January 26, 2010--Presents for an acute office visit. Complains of prod cough with green/yellow/brown mucus, wheezing, increased SOB x10days . OTC not helping. Denies chest pain, orthopnea, hemoptysis, fever, n/v/d, edema, headache.   March 13, 2010 -- symptoms for couple weeks, cough with sputum, worse at night, hoarse, gurgle in throat/upper chest, creamy sputum, no hemoptysis, sinuses bad, no fever, occ wheeze, nasonex at bedtime, proair one per week, sinus solution once daily, clarinex once daily.  ROV 04/03/10 -- returns for f/u COPD, allergic rhinitis, GERD, hx laryngeal CA. Recent treatment for acute sinusitis with Augmentin, also added atrovent NS to his other allergy regimen. He feels better, HA improved, mucous is now clear. Breathing has been good. Recently started on doxycycline to treat a dermatitis.   ROV 07/22/10 -- COPD, sinus disease, allergies. Old granulomatous disease. Taking clarinex, nasonex, mucinex DM. Quit atrovent nose spray. Taking Advair two times a day, rare ProAir use. No exacerbations since last visit. Doesn't rinse mouth out after the Advair, has thrush.  08/19/10-Presents for post hospital visit. Admitted   08/04/2010- 08/07/2010 for COPD exacerbation  and PNA -bilateral lower lobe PNA. Tx w/ Rocephin and Zithromax. along w/ steroid taper and nebs. Discharged on [place]. Returns today w/ persistent cough with thick green mucus and dyspnea.    Medications Prior to Update: 1)  Advair Diskus 250-50 Mcg/dose  Misc (Fluticasone-Salmeterol) .... Inhale 1 Puff Two Times A Day 2)  Singulair 10 Mg  Tabs (Montelukast Sodium) .... Take 1 Tablet By Mouth Once A Day 3)  Proair Hfa 108 (90 Base) Mcg/act Aers (Albuterol Sulfate) .... Inhale 2 Puffs Every 4 Hours As Needed 4)  Nasonex 50 Mcg/act  Susp (Mometasone Furoate) .... As Needed 5)  Adult Aspirin Low Strength 81 Mg  Tbdp (Aspirin) .... Take 1 Tablet By Mouth Once A Day 6)  Vitamin C 1000 Mg  Tabs (Ascorbic Acid) .... Take 1 Tablet By Mouth Twice A Day 7)  Prevacid 24hr 15 Mg Cpdr (Lansoprazole) .Marland Kitchen.. 1 By Mouth Two Times A Day 8)  Fish Oil 1000 Mg  Caps (Omega-3 Fatty Acids) .... Take 1 Tablet By Mouth Twice A Day 9)  Diovan 80 Mg Tabs (Valsartan) .Marland Kitchen.. 1 By Mouth Once Daily 10)  Crestor 20 Mg  Tabs (Rosuvastatin Calcium) .... Take 1 Tablet By Mouth Once A Day 11)  Mucinex Dm 30-600 Mg  Tb12 (Dextromethorphan-Guaifenesin) .Marland Kitchen.. 1-2 Tabs Every 12 Hours As Needed 12)  Clarinex 5 Mg Tabs (Desloratadine) .... Once Daily 13)  Hydromet 5-1.5 Mg/30m Syrp (Hydrocodone-Homatropine) ..Marland Kitchen. 1-2 Tsp Every 4-6 Hr As Needed Cough 14)  Atrovent 0.03 % Soln (Ipratropium Bromide) .... 2 Sprays Each Nostril Two Times A Day As Needed 15)  Fluconazole 100 Mg Tabs (Fluconazole) ..Marland Kitchen. 1 By Mouth Once Daily  X 3 Days 16)  Mucinex Dm 30-600 Mg Xr12h-Tab (Dextromethorphan-Guaifenesin) .Marland Kitchen.. 1 By Mouth Two Times A Day  Current Medications (verified): 1)  Advair Diskus 250-50 Mcg/dose  Misc (Fluticasone-Salmeterol) .... Inhale 1 Puff Two Times A Day 2)  Singulair 10 Mg  Tabs (Montelukast Sodium) .... Take 1 Tablet By Mouth Once A Day 3)  Proair Hfa 108 (90 Base) Mcg/act Aers (Albuterol Sulfate) .... Inhale 2 Puffs Every 4 Hours As  Needed 4)  Nasonex 50 Mcg/act  Susp (Mometasone Furoate) .... As Needed 5)  Adult Aspirin Low Strength 81 Mg  Tbdp (Aspirin) .... Take 1 Tablet By Mouth Once A Day 6)  Vitamin C 1000 Mg  Tabs (Ascorbic Acid) .... Take 1 Tablet By Mouth Twice A Day 7)  Prevacid 24hr 15 Mg Cpdr (Lansoprazole) .Marland Kitchen.. 1 By Mouth Two Times A Day 8)  Fish Oil 1000 Mg  Caps (Omega-3 Fatty Acids) .... Take 1 Tablet By Mouth Twice A Day 9)  Diovan 80 Mg Tabs (Valsartan) .Marland Kitchen.. 1 By Mouth Once Daily 10)  Crestor 20 Mg  Tabs (Rosuvastatin Calcium) .... Take 1 Tablet By Mouth Once A Day 11)  Clarinex 5 Mg Tabs (Desloratadine) .... Once Daily 12)  Hydromet 5-1.5 Mg/65m Syrp (Hydrocodone-Homatropine) ..Marland Kitchen. 1-2 Tsp Every 4-6 Hr As Needed Cough 13)  Atrovent 0.03 % Soln (Ipratropium Bromide) .... 2 Sprays Each Nostril Two Times A Day As Needed 14)  Fluconazole 100 Mg Tabs (Fluconazole) ..Marland Kitchen. 1 By Mouth Once Daily X 3 Days 15)  Mucinex Dm 30-600 Mg Xr12h-Tab (Dextromethorphan-Guaifenesin) ..Marland Kitchen. 1 By Mouth Two Times A Day 16)  Co-Enzyme Q-10 10 Mg Caps (Coenzyme Q10) .... Take 1 Capsule By Mouth Two Times A Day  Allergies (verified): 1)  ! 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  still prod cough with yellow/green mucus, some DOE Is Patient Diabetic? No Comments Medications reviewed with patient Daytime contact number verified with patient. JParke PoissonCNA/MA  August 19, 2010 11:55 AM    Physical Exam  Additional Exam:  GEN: A/Ox3; pleasant , NAD HEENT:  [place]/AT, , EACs-clear, TMs-wnl, NOSE-clear, THROAT-clear  NECK:  Supple w/ fair ROM; no JVD; normal carotid impulses w/o bruits; no thyromegaly or nodules palpated; no lymphadenopathy. RESP  Coarse BS w/ no wheezing  CARD:  RRR, no m/r/g   GI:   Soft & nt; nml bowel sounds; no organomegaly or masses detected.  Musco: Warm bil,  no calf tenderness edema, clubbing, pulses intact Neuro:intact w/ no focal deficits noted.    Impression & Recommendations:  Problem # 1:  BRONCHITIS, OBSTRUCTIVE CHRONIC, ACUTE EXACERBATION (ICD-491.21) Slow to resolve exacerbation w/ recent hospitalziation w/ biateral PNA Plan:  Avelox 428m once daily for 7days Mucinex DM two times a day as needed cough/congestion  Increase fluids.  I will call with xray results.  Please contact office for sooner follow up if symptoms do not improve or worsen  follow up Dr. BLamonte Sakaiin 3-4 weeks and as needed  Orders: T-2 View CXR (71020TC) Est. Patient Level IV (([id number]  Medications Added to Medication List This Visit: 1)  Co-enzyme Q-10 10 Mg Caps (Coenzyme q10) .... Take 1 capsule by mouth two times a day 2)  Avelox 400 Mg Tabs (Moxifloxacin hcl) ..Marland Kitchen. 1 by mouth once daily  Complete Medication List: 1)  Advair Diskus 250-50 Mcg/dose Misc (Fluticasone-salmeterol) .... Inhale 1 puff two times a day 2)  Singulair 10 Mg Tabs (Montelukast sodium) .... Take 1 tablet by mouth  once a day 3)  Proair Hfa 108 (90 Base) Mcg/act Aers (Albuterol sulfate) .... Inhale 2 puffs every 4 hours as needed 4)  Nasonex 50 Mcg/act Susp (Mometasone furoate) .... As needed 5)  Adult Aspirin Low Strength 81 Mg Tbdp (Aspirin) .... Take 1 tablet by mouth once a day 6)  Vitamin C 1000 Mg Tabs (Ascorbic acid) .... Take 1 tablet by mouth twice a day 7)  Prevacid 24hr 15 Mg Cpdr (Lansoprazole) ..Marland Kitchen. 1 by mouth two times a day 8)  Fish Oil 1000 Mg Caps (Omega-3 fatty acids) .... Take 1 tablet by mouth twice a day 9)  Diovan 80 Mg Tabs (Valsartan) ..Marland Kitchen. 1 by mouth once daily 10)  Crestor 20 Mg Tabs (Rosuvastatin calcium) .... Take 1 tablet by mouth once a day 11)  Clarinex 5 Mg Tabs (Desloratadine) .... Once daily 12)  Hydromet 5-1.5 Mg/535mSyrp (Hydrocodone-homatropine) ...Marland Kitchen 1-2 tsp every 4-6 hr as needed cough 13)  Atrovent 0.03 % Soln (Ipratropium bromide) .... 2 sprays each nostril two times a day as needed 14)  Fluconazole 100 Mg Tabs (Fluconazole) ...Marland Kitchen 1 by mouth once daily x 3 days 15)  Mucinex Dm 30-600 Mg Xr12h-tab (Dextromethorphan-guaifenesin) ...Marland Kitchen 1 by mouth two times a day 16)  Co-enzyme Q-10 10 Mg Caps (Coenzyme q10) .... Take 1 capsule by mouth two times a day 17)  Avelox 400 Mg Tabs (Moxifloxacin hcl) ...Marland Kitchen 1 by mouth once daily  Patient Instructions: 1)  Avelox 40031mnce daily for 7days 2)  Mucinex DM two times a day as needed cough/congestion  3)  Increase fluids.  4)  I will call with xray results.  5)  Please contact office for sooner follow up if symptoms do not improve or worsen  6)  follow up Dr. ByrLamonte Sakai 3-4 weeks and as needed  Prescriptions: AVELOX 400 MG TABS (MOXIFLOXACIN HCL) 1 by mouth once daily  #7 x 0   Entered and Authorized by:   TamRexene Edison   Signed by:   Tammy Parrett NP on 08/19/2010   Method used:   [id number]       [id number]    [place]  [id number]     Ph: [phone]  Fax: [phone]   RxID:   [id number]

## 2010-09-24 NOTE — Miscellaneous (Signed)
Summary: Orders Update  Clinical Lists Changes  Orders: Added new Test order of T-2 View CXR ([id number]) - Signed 

## 2010-09-24 NOTE — Assessment & Plan Note (Signed)
Summary: sinusitis, COPD   Visit Type:  Follow-up Primary Provider/Referring Provider:  Dr Nino Glow  CC:  1 month followup.  Pt states that his breathing has been improving over the past several days.  He states that PND has almost resolved.  No complaints today.  Last dose of Augmentin today.Marland Kitchen  History of Present Illness: Mr. Ranta is a 76 year old gentleman with a history of COPD, remote laryngeal cancer with some dysphagia, allergic rhinitis, and esophageal reflux. He has also been followed for chronic nodular granulomatous disease and bronchiectasis on CT scan chest, stable on CT for 2 + yrs.    August 04, 2009--Presents for an acute office viist. Complains of increased SOB, prod cough with yellow/green/creamy colored mucus, wheezing x1day - Last visit w/ COPD flare , tx w/ Doxycyline x 7 days. He got better  but not has it again. OTC not helping. Worse last night. Denies chest pain,  orthopnea, hemoptysis, fever, n/v/d, edema, headache.   ROV 09/10/09 -- Seen a month ago for acute bronchitis, had also been treated in Nov for prod cough, treated w abx. Was taking [place] but gave up on them. Still with colored mucous, green with blood streaks. Uses SABA rarely.   ROV 10/10/09 -- Returns for f/u. Finishing Augmentin today (4 week course), doing nasal saline washes, clarinex, mucinex. Breathing well, using Advair two times a day. Nasal gtt better, cough better.   Current Medications (verified): 1)  Adult Aspirin Low Strength 81 Mg  Tbdp (Aspirin) .... Take 1 Tablet By Mouth Once A Day 2)  Advair Diskus 250-50 Mcg/dose  Misc (Fluticasone-Salmeterol) .... Inhale 1 Puff Two Times A Day 3)  Vitamin C 1000 Mg  Tabs (Ascorbic Acid) .... Take 1 Tablet By Mouth Twice A Day 4)  Prevacid 24hr 15 Mg Cpdr (Lansoprazole) .Marland Kitchen.. 1 By Mouth Two Times A Day 5)  Fish Oil 1000 Mg  Caps (Omega-3 Fatty Acids) .... Take 1 Tablet By Mouth Twice A Day 6)  Diovan 80 Mg Tabs (Valsartan) .Marland Kitchen.. 1 By Mouth Once Daily 7)   Singulair 10 Mg  Tabs (Montelukast Sodium) .... Take 1 Tablet By Mouth Once A Day 8)  Crestor 20 Mg  Tabs (Rosuvastatin Calcium) .... Take 1 Tablet By Mouth Once A Day 9)  Nasonex 50 Mcg/act  Susp (Mometasone Furoate) .... As Needed 10)  Proair Hfa 108 (90 Base) Mcg/act Aers (Albuterol Sulfate) .... Inhale 2 Puffs Every 4 Hours As Needed 11)  Mucinex Dm 30-600 Mg  Tb12 (Dextromethorphan-Guaifenesin) .Marland Kitchen.. 1-2 Tabs Every 12 Hours As Needed 12)  Clarinex 5 Mg Tabs (Desloratadine) .... Once Daily 13)  Hydromet 5-1.5 Mg/60m Syrp (Hydrocodone-Homatropine) ..Marland Kitchen. 1-2 Tsp Every 4-6 Hr As Needed Cough 14)  Amoxicillin-Pot Clavulanate 875-125 Mg Tabs (Amoxicillin-Pot Clavulanate) ..Marland Kitchen. 1 By Mouth Two Times A Day  Allergies (verified): 1)  ! Codeine  Vital Signs:  Patient profile:   [age]year old male Weight:      154 pounds O2 Sat:      90 % on Room air Temp:     97.7 degrees F oral Pulse rate:   58 / minute BP sitting:   120 / 80  (left arm)  Vitals Entered By: LTilden Dome(October 10, 2009 9:20 AM)  O2 Flow:  Room air  Physical Exam  General:  well developed, well nourished, in no acute distress Head:  normocephalic and atraumatic Nose:  no deformity, discharge, inflammation, or lesions Mouth:  Hoarse voice Neck:  no masses, thyromegaly, or abnormal  cervical nodes, hoarse voice Chest Wall:  no deformities noted Lungs:  Clear, no wheeze Heart:  regular rate and rhythm, S1, S2 without murmurs, rubs, gallops, or clicks Abdomen:  not examined Msk:  no deformity or scoliosis noted with normal posture Extremities:  no clubbing, cyanosis, edema, or deformity noted Neurologic:  nonfocal Psych:  alert and cooperative; normal mood and affect; normal attention span and concentration   Impression & Recommendations:  Problem # 1:  SINUSITIS, CHRONIC (ICD-473.9) Improved. Finish Augmentin  Problem # 2:  COPD UNSPECIFIED (ICD-496) Advair two times a day   Problem # 3:  PULMONARY NODULE  (ICD-518.89) Stable x 2+ yrs by CT  Problem # 4:  CHRONIC RHINITIS (ICD-472.0) nasal saline and allergy regimen reviewed today  Other Orders: Est. Patient Level III (44619)  Patient Instructions: 1)  Continue your nasal saline once daily, clarinex, mucinex, singulair. 2)  Continue Advair two times a day  3)  Finish your Augmentin 4)  Follow up with Dr Lamonte Sakai in 6 months or as needed.

## 2010-09-24 NOTE — Progress Notes (Signed)
Summary: prescript  Phone Note Call from Patient   Caller: Patient Call For: byrum Summary of Call: need refill for nasonex  [facility] [address] Initial call taken by: Gustavus Bryant,  September 26, 2009 8:27 AM  Follow-up for Phone Call        Rx was refilled.  LMOM to let pt know this was done. Follow-up by: Tilden Dome,  September 26, 2009 9:17 AM    Prescriptions: NASONEX 50 MCG/ACT  SUSP (MOMETASONE FUROATE) as needed  #1 x 5   Entered by:   Tilden Dome   Authorized by:   Collene Gobble MD   Signed by:   Tilden Dome on 09/26/2009   Method used:   [facility]        [facility] [place] [phone]* (retail)       [address]       [place], [place]  [address]       Ph: [phone]       Fax: [phone]   RxID:   [id number]

## 2010-09-24 NOTE — Assessment & Plan Note (Signed)
Summary: COPD, chronic rhinitis   Visit Type:  Follow-up Primary Provider/Referring Provider:  Dr Nino Glow  CC:  3 mo COPD and sinusitis follow-up...pt c/o PND and prod cough w/ tan sputum.  History of Present Illness: Bruce Travis is a 76 year old gentleman with a history of COPD, remote laryngeal cancer with some dysphagia, allergic rhinitis, and esophageal reflux. He has also been followed for chronic nodular granulomatous disease and bronchiectasis on CT scan chest, stable on CT for 2 + yrs.    ROV 10/10/09 -- Returns for f/u. Finishing Augmentin today (4 week course), doing nasal saline washes, clarinex, mucinex. Breathing well, using Advair two times a day. Nasal gtt better, cough better.   January 26, 2010--Presents for an acute office visit. Complains of prod cough with green/yellow/brown mucus, wheezing, increased SOB x10days . OTC not helping. Denies chest pain, orthopnea, hemoptysis, fever, n/v/d, edema, headache.   March 13, 2010 -- symptoms for couple weeks, cough with sputum, worse at night, hoarse, gurgle in throat/upper chest, creamy sputum, no hemoptysis, sinuses bad, no fever, occ wheeze, nasonex at bedtime, proair one per week, sinus solution once daily, clarinex once daily.  ROV 04/03/10 -- returns for f/u COPD, allergic rhinitis, GERD, hx laryngeal CA. Recent treatment for acute sinusitis with Augmentin, also added atrovent NS to his other allergy regimen. He feels better, HA improved, mucous is now clear. Breathing has been good. Recently started on doxycycline to treat a dermatitis.   ROV 07/22/10 -- COPD, sinus disease, allergies. Old granulomatous disease. Taking clarinex, nasonex, mucinex DM. Quit atrovent nose spray. Taking Advair two times a day, rare ProAir use. No exacerbations since last visit. Doesn't rinse mouth out after the Advair, has thrush.   Current Medications (verified): 1)  Advair Diskus 250-50 Mcg/dose  Misc (Fluticasone-Salmeterol) .... Inhale 1 Puff Two  Times A Day 2)  Singulair 10 Mg  Tabs (Montelukast Sodium) .... Take 1 Tablet By Mouth Once A Day 3)  Proair Hfa 108 (90 Base) Mcg/act Aers (Albuterol Sulfate) .... Inhale 2 Puffs Every 4 Hours As Needed 4)  Nasonex 50 Mcg/act  Susp (Mometasone Furoate) .... As Needed 5)  Adult Aspirin Low Strength 81 Mg  Tbdp (Aspirin) .... Take 1 Tablet By Mouth Once A Day 6)  Vitamin C 1000 Mg  Tabs (Ascorbic Acid) .... Take 1 Tablet By Mouth Twice A Day 7)  Prevacid 24hr 15 Mg Cpdr (Lansoprazole) .Marland Kitchen.. 1 By Mouth Two Times A Day 8)  Fish Oil 1000 Mg  Caps (Omega-3 Fatty Acids) .... Take 1 Tablet By Mouth Twice A Day 9)  Diovan 80 Mg Tabs (Valsartan) .Marland Kitchen.. 1 By Mouth Once Daily 10)  Crestor 20 Mg  Tabs (Rosuvastatin Calcium) .... Take 1 Tablet By Mouth Once A Day 11)  Mucinex Dm 30-600 Mg  Tb12 (Dextromethorphan-Guaifenesin) .Marland Kitchen.. 1-2 Tabs Every 12 Hours As Needed 12)  Clarinex 5 Mg Tabs (Desloratadine) .... Once Daily 13)  Hydromet 5-1.5 Mg/3m Syrp (Hydrocodone-Homatropine) ..Marland Kitchen. 1-2 Tsp Every 4-6 Hr As Needed Cough 14)  Atrovent 0.03 % Soln (Ipratropium Bromide) .... 2 Sprays Each Nostril Two Times A Day As Needed  Allergies (verified): 1)  ! Codeine  Vital Signs:  Patient profile:   76year old male Height:      67 inches (170.18 cm) Weight:      149.50 pounds (67.95 kg) BMI:     23.50 O2 Sat:      92 % on Room air Temp:     97.8 degrees F (36.56  degrees C) oral Pulse rate:   76 / minute BP sitting:   130 / 74  (left arm) Cuff size:   regular  Vitals Entered By: Francesca Jewett CMA (July 22, 2010 2:59 PM)  O2 Sat at Rest %:  92 O2 Flow:  Room air CC: 3 mo COPD and sinusitis follow-up...pt c/o PND and prod cough w/ tan sputum Comments Medications reviewed with patient Francesca Jewett CMA  July 22, 2010 3:00 PM   Physical Exam  General:  well developed, well nourished, in no acute distress Head:  normocephalic and atraumatic Nose:  clear nasal discharge, no sinus tenderness Mouth:  mild  erythema Neck:  no JVD.   Chest Wall:  no deformities noted Lungs:  Clear, no wheeze Heart:  regular rate and rhythm, S1, S2 without murmurs, rubs, gallops, or clicks Abdomen:  not examined Msk:  no deformity or scoliosis noted with normal posture Extremities:  no clubbing, cyanosis, edema, or deformity noted Neurologic:  nonfocal Cervical Nodes:  no significant adenopathy Psych:  alert and cooperative; normal mood and affect; normal attention span and concentration   Impression & Recommendations:  Problem # 1:  COPD UNSPECIFIED (ICD-496)  Problem # 2:  CANDIDIASIS, ORAL (ICD-112.0)  Orders: Est. Patient Level IV (70177)  Problem # 3:  CHRONIC RHINITIS (ICD-472.0)  Problem # 4:  PULMONARY NODULE (ICD-518.89) have followed 2 yrs for stability but he is worried about the risk for lung CA. we agreed to revisit whether we should repeat a scan in february  Medications Added to Medication List This Visit: 1)  Fluconazole 100 Mg Tabs (Fluconazole) .Marland Kitchen.. 1 by mouth once daily x 3 days 2)  Mucinex Dm 30-600 Mg Xr12h-tab (Dextromethorphan-guaifenesin) .Marland Kitchen.. 1 by mouth two times a day  Patient Instructions: 1)  Continue your Advair two times a day  2)  Continue your nasonex, clarinex, mucinex DM.  3)  We will consider repeating your CT scan of the chest in February 2012 (2 years after your last one). 4)  Fluconazole x 3 days Prescriptions: MUCINEX DM 30-600 MG XR12H-TAB (DEXTROMETHORPHAN-GUAIFENESIN) 1 by mouth two times a day  #60 x 11   Entered and Authorized by:   Collene Gobble MD   Signed by:   Collene Gobble MD on 07/22/2010   Method used:   Print then Give to Patient   RxID:   [id number] FLUCONAZOLE 100 MG TABS (FLUCONAZOLE) 1 by mouth once daily x 3 days  #3 x 0   Entered and Authorized by:   Collene Gobble MD   Signed by:   Collene Gobble MD on 07/22/2010   Method used:   Electronically to        [facility] [phone]* (retail)       [address]        [place], [place]  [address]       Ph: [phone]       Fax: [phone]   RxID:   [id number]    Immunization History:  Influenza Immunization History:    Influenza:  historical (06/02/2010)

## 2010-09-24 NOTE — Assessment & Plan Note (Signed)
Summary: CoPD   Visit Type:  Follow-up Primary Provider/Referring Provider:  Dr Nino Glow  CC:  COPD.Marland KitchenMarland KitchenRhinitis...pt still c/o sinus drainage and cough w/ discolored sputum...no changes in breathing better or worse.  History of Present Illness: Bruce Travis is a 76 year old gentleman with a history of COPD, remote laryngeal cancer with some dysphagia, allergic rhinitis, and esophageal reflux. He has also been followed for chronic nodular granulomatous disease and bronchiectasis on CT scan chest, stable on CT for 2 + yrs.    March 13, 2010 -- symptoms for couple weeks, cough with sputum, worse at night, hoarse, gurgle in throat/upper chest, creamy sputum, no hemoptysis, sinuses bad, no fever, occ wheeze, nasonex at bedtime, proair one per week, sinus solution once daily, clarinex once daily.  ROV 04/03/10 -- returns for f/u COPD, allergic rhinitis, GERD, hx laryngeal CA. Recent treatment for acute sinusitis with Augmentin, also added atrovent NS to his other allergy regimen. He feels better, HA improved, mucous is now clear. Breathing has been good. Recently started on doxycycline to treat a dermatitis.   ROV 07/22/10 -- COPD, sinus disease, allergies. Old granulomatous disease. Taking clarinex, nasonex, mucinex DM. Quit atrovent nose spray. Taking Advair two times a day, rare ProAir use. No exacerbations since last visit. Doesn't rinse mouth out after the Advair, has thrush.   08/19/10-Presents for post hospital visit. Admitted   08/04/2010- 08/07/2010 for COPD exacerbation and PNA -bilateral lower lobe PNA. Tx w/ Rocephin and Zithromax. along w/ steroid taper and nebs. Discharged on [place]. Returns today w/ persistent cough with thick green mucus and dyspnea.    ROV 09/08/10 -- follow up for COPD, chronic rhinitis, allergies, chronic sinusitis, old granulomatous disease. Still has clear nasal drainage, sometimes yellow, green, blood-tinged. Has been rx for possible exacerbation. Using [place]'s, never  tried atrovent nasal spray. He is on Advair, he isn't sure that it helps him.  08/19/10-Presents for post hospital visit. Admitted   08/04/2010- 08/07/2010 for COPD exacerbation and PNA -bilateral lower lobe PNA. Tx w/ Rocephin and Zithromax. along w/ steroid taper and nebs. Discharged on [place]. Returns today w/ persistent cough with thick green mucus and dyspnea.    ROV 09/08/10 -- follow up for COPD, chronic rhinitis, allergies, chronic sinusitis, old granulomatous disease.   Preventive Screening-Counseling & Management  Alcohol-Tobacco     Smoking Status: quit     Packs/Day: 4.0     Year Started: 1950     Year Quit: 1990  Current Medications (verified): 1)  Advair Diskus 250-50 Mcg/dose  Misc (Fluticasone-Salmeterol) .... Inhale 1 Puff Two Times A Day 2)  Singulair 10 Mg  Tabs (Montelukast Sodium) .... Take 1 Tablet By Mouth Once A Day 3)  Proair Hfa 108 (90 Base) Mcg/act Aers (Albuterol Sulfate) .... Inhale 2 Puffs Every 4 Hours As Needed 4)  Nasonex 50 Mcg/act  Susp (Mometasone Furoate) .... As Needed 5)  Adult Aspirin Low Strength 81 Mg  Tbdp (Aspirin) .... Take 1 Tablet By Mouth Once A Day 6)  Vitamin C 1000 Mg  Tabs (Ascorbic Acid) .... Take 1 Tablet By Mouth Twice A Day 7)  Prevacid 24hr 15 Mg Cpdr (Lansoprazole) .Marland Kitchen.. 1 By Mouth Two Times A Day 8)  Fish Oil 1000 Mg  Caps (Omega-3 Fatty Acids) .... Take 1 Tablet By Mouth Twice A Day 9)  Diovan 80 Mg Tabs (Valsartan) .Marland Kitchen.. 1 By Mouth Once Daily 10)  Crestor 20 Mg  Tabs (Rosuvastatin Calcium) .... Take 1 Tablet By Mouth Once A Day 11)  Clarinex 5 Mg Tabs (Desloratadine) .... Once Daily 12)  Hydromet 5-1.5 Mg/66m Syrp (Hydrocodone-Homatropine) ..Marland Kitchen. 1-2 Tsp Every 4-6 Hr As Needed Cough 13)  Mucinex Dm 30-600 Mg Xr12h-Tab (Dextromethorphan-Guaifenesin) ..Marland Kitchen. 1 By Mouth Two Times A Day 14)  Co-Enzyme Q-10 10 Mg Caps (Coenzyme Q10) .... Take 1 Capsule By Mouth Two Times A Day  Allergies (verified): 1)  ! Codeine  Social  History: Packs/Day:  4.0  Vital Signs:  Patient profile:   76year old male Height:      67 inches (170.18 cm) Weight:      150.50 pounds (68.41 kg) BMI:     23.66 O2 Sat:      91 % on Room air Temp:     97.9 degrees F (36.61 degrees C) oral Pulse rate:   69 / minute BP sitting:   122 / 80  (left arm) Cuff size:   regular  Vitals Entered By: Bruce JewettCMA (September 08, 2010 11:24 AM)  O2 Sat at Rest %:  91 O2 Flow:  Room air CC: COPD..Marland KitchenMarland Kitchenhinitis...pt still c/o sinus drainage and cough w/ discolored sputum...no changes in breathing better or worse   Physical Exam  General:  well developed, well nourished, in no acute distress Head:  normocephalic and atraumatic Nose:  clear nasal discharge, no sinus tenderness Mouth:  mild erythema Neck:  no JVD.   Chest Wall:  no deformities noted Lungs:  Clear, no wheeze Heart:  regular rate and rhythm, S1, S2 without murmurs, rubs, gallops, or clicks Abdomen:  not examined Msk:  no deformity or scoliosis noted with normal posture Extremities:  no clubbing, cyanosis, edema, or deformity noted Neurologic:  nonfocal Cervical Nodes:  no significant adenopathy Psych:  alert and cooperative; normal mood and affect; normal attention span and concentration   Medications Added to Medication List This Visit: 1)  Atrovent 0.03 % Soln (Ipratropium bromide) .... 2 sprays each nostril three times a day  Other Orders: Est. Patient Level IV ((41287  Patient Instructions: 1)  Try stopping Advair for the next month. If your breathing worsens off of it, call our office.  2)  Follow up in a month to decide whether we restart the Advair or not 3)  Start atrovent 0.03% nasal spray 2 sprays each side three times a day  4)  Continue singulair once daily  5)  Follow up in 1 month.  Prescriptions: ATROVENT 0.03 % SOLN (IPRATROPIUM BROMIDE) 2 sprays each nostril three times a day  #1 x 3   Entered and Authorized by:   RCollene GobbleMD   Signed by:    RCollene GobbleMD on 09/08/2010   Method used:   Electronically to        [facility](retail)       [address]      [place]  [address]      Ph: [phone]      Fax: [phone]  RxID:   [id number]

## 2010-09-24 NOTE — Progress Notes (Signed)
Summary: rx  Phone Note Call from Patient Call back at Work Phone [phone]   Caller: Patient Call For: byrum Reason for Call: Talk to Nurse Summary of Call: prilosec not helping pt, wakes up at night w/indegestion.  Aciphex was only thing that helped. Initial call taken by: Zigmund Gottron,  October 23, 2009 11:47 AM  Follow-up for Phone Call        Please advise if okay to call in rx for aciphex for pt. Thanks Tilden Dome  October 23, 2009 11:58 AM  Yes - this should serve as documentation that the pt tried and failed omeprazole. His insurance will probably require this. RSB Follow-up by: Collene Gobble MD,  October 23, 2009 12:37 PM  Additional Follow-up for Phone Call Additional follow up Details #1::        rx sent for aciphex Additional Follow-up by: Maryann Conners CMA,  October 23, 2009 2:44 PM    New/Updated Medications: ACIPHEX 20 MG TBEC (RABEPRAZOLE SODIUM) 1 by mouth once daily Prescriptions: ACIPHEX 20 MG TBEC (RABEPRAZOLE SODIUM) 1 by mouth once daily  #30 x 6   Entered by:   Maryann Conners CMA   Authorized by:   Collene Gobble MD   Signed by:   Maryann Conners CMA on 10/23/2009   Method used:   [facility] [id number]       [address]       [place], [place]  [address]       Ph: [phone]       Fax: [phone]   RxID:   [id number]

## 2010-09-24 NOTE — Assessment & Plan Note (Signed)
Summary: COPD, chronic sinusitis   Visit Type:  Follow-up  CC:  Bronchitis follow-up per patient.  The patient c/osinus drainage...cough with greenish bloody mucus...sob and wheezing.Marland Kitchen  History of Present Illness: [name] is a 76 year old gentleman with a history of COPD, remote laryngeal cancer with some dysphagia, allergic rhinitis, and esophageal reflux. He has also been followed for chronic nodular granulomatous disease and bronchiectasis on CT scan chest, stable on CT for 2 + yrs.   01/15/09 -- returns for followup. Coughs daily, clear mucous. Hoarse voice. Reports that he was recently treated with avelox for bronchitis vs PNA. Also recently treated for diverticulitis. Breathing is stable.     July 07, 2009--Presents for 6 mo follow up.  Pt states breathing is doing well overall.  Complains of runny nose and cough with yellow mucus x1 wk. Mucinex not helping.    August 04, 2009--Presents for an acute office viist. Complains of increased SOB, prod cough with yellow/green/creamy colored mucus, wheezing x1day - Last visit w/ COPD flare , tx w/ Doxycyline x 7 days. He got better  but not has it again. OTC not helping. Worse last night. Denies chest pain,  orthopnea, hemoptysis, fever, n/v/d, edema, headache.   ROV 09/10/09 -- Seen a month ago for acute bronchitis, had also been treated in Nov for prod cough, treated w abx. Was taking [place] but gave up on them. Still with colored mucous, green with blood streaks. Uses SABA rarely.   Current Medications (verified): 1)  Adult Aspirin Low Strength 81 Mg  Tbdp (Aspirin) .... Take 1 Tablet By Mouth Once A Day 2)  Advair Diskus 250-50 Mcg/dose  Misc (Fluticasone-Salmeterol) .... Inhale 1 Puff Two Times A Day 3)  Vitamin C 1000 Mg  Tabs (Ascorbic Acid) .... Take 1 Tablet By Mouth Twice A Day 4)  Prevacid 24hr 15 Mg Cpdr (Lansoprazole) .Marland Kitchen.. 1 By Mouth Two Times A Day 5)  Fish Oil 1000 Mg  Caps (Omega-3 Fatty Acids) .... Take 1 Tablet By Mouth Twice  A Day 6)  Diovan 80 Mg Tabs (Valsartan) .Marland Kitchen.. 1 By Mouth Once Daily 7)  Singulair 10 Mg  Tabs (Montelukast Sodium) .... Take 1 Tablet By Mouth Once A Day 8)  Crestor 20 Mg  Tabs (Rosuvastatin Calcium) .... Take 1 Tablet By Mouth Once A Day 9)  Nasonex 50 Mcg/act  Susp (Mometasone Furoate) .... As Needed 10)  Proair Hfa 108 (90 Base) Mcg/act Aers (Albuterol Sulfate) .... Inhale 2 Puffs Every 4 Hours As Needed 11)  Mucinex Dm 30-600 Mg  Tb12 (Dextromethorphan-Guaifenesin) .Marland Kitchen.. 1-2 Tabs Every 12 Hours As Needed 12)  Clarinex 5 Mg Tabs (Desloratadine) .... Once Daily 13)  Hydromet 5-1.5 Mg/63m Syrp (Hydrocodone-Homatropine) ..Marland Kitchen. 1-2 Tsp Every 4-6 Hr As Needed Cough  Allergies (verified): 1)  ! Codeine  Vital Signs:  Patient profile:   76year old male Height:      67 inches (170.18 cm) Weight:      151.50 pounds (68.86 kg) BMI:     23.81 O2 Sat:      88 % on Room air Temp:     98.0 degrees F (36.67 degrees C) oral Pulse rate:   69 / minute BP sitting:   96 / 62  (left arm) Cuff size:   regular  Vitals Entered By: LFrancesca JewettCMA (September 10, 2009 11:00 AM)  O2 Sat at Rest %:  88 O2 Flow:  Room air  O2 Sat Comments O2 sats rechecked and they were at 94%  on room air. Francesca Jewett [facility]  September 10, 2009 11:03 AM CC: Bronchitis follow-up per patient.  The patient c/osinus drainage...cough with greenish bloody mucus...sob and wheezing.   Physical Exam  General:  well developed, well nourished, in no acute distress Head:  normocephalic and atraumatic Nose:  no deformity, discharge, inflammation, or lesions Mouth:  Hoarse voice Neck:  no masses, thyromegaly, or abnormal cervical nodes, hoarse voice Chest Wall:  no deformities noted Lungs:  Clear, no wheeze Heart:  regular rate and rhythm, S1, S2 without murmurs, rubs, gallops, or clicks Abdomen:  not examined Msk:  no deformity or scoliosis noted with normal posture Extremities:  no clubbing, cyanosis, edema, or deformity  noted Neurologic:  nonfocal Psych:  alert and cooperative; normal mood and affect; normal attention span and concentration   Impression & Recommendations:  Problem # 1:  COPD UNSPECIFIED (ICD-496) Advair  Problem # 2:  CHRONIC RHINITIS (ICD-472.0)  Allergy regimen reviewed  Orders: Est. Patient Level IV (63846)  Problem # 3:  SINUSITIS, CHRONIC (ICD-473.9) Augmentin x 4 weeks If no better will refer back to Dr [name] with ENT  Medications Added to Medication List This Visit: 1)  Amoxicillin-pot Clavulanate 875-125 Mg Tabs (Amoxicillin-pot clavulanate) .Marland Kitchen.. 1 by mouth two times a day  Patient Instructions: 1)  CXR today 2)  Continue your clarinex 3)  Restart nasonex 2 sprays each side once daily  4)  Restart nasal saline washes once daily  5)  Continue your Advair two times a day 6)  Start Augmentin 875m by mouth two times a day for 4 weeks  7)  Follow up with Dr BLamonte Sakaiin 1 month Prescriptions: AMOXICILLIN-POT CLAVULANATE 875-125 MG TABS (AMOXICILLIN-POT CLAVULANATE) 1 by mouth two times a day  #60 x 0   Entered and Authorized by:   RCollene GobbleMD   Signed by:   RCollene GobbleMD on 09/10/2009   Method used:   [phone]       [address]      [place]  [address]      Ph: [phone]      Fax: [phone]  [place]   [phone]

## 2010-10-09 ENCOUNTER — Encounter (INDEPENDENT_AMBULATORY_CARE_PROVIDER_SITE_OTHER): Payer: Self-pay | Admitting: *Deleted

## 2010-10-14 ENCOUNTER — Ambulatory Visit: Payer: Self-pay | Admitting: Emergency Medicine

## 2010-10-14 NOTE — Letter (Signed)
Summary: New Patient letter  [facility]  [address] [place], [place] [address]   Phone: [phone]  Fax: [phone]       10/09/2010 MRN: [id number]  Bruce Travis [address], [place]  [address]  Dear Bruce Travis,  Welcome to the Gastroenterology Division at [facility].    You are scheduled to see Dr.  Sharlett Iles on 11-12-10 at 10:45A.M. on the 3rd floor at [facility] [place] [employer].  We ask that you try to arrive at our office 15 minutes prior to your appointment time to allow for check-in.  We would like you to complete the enclosed self-administered evaluation form prior to your visit and bring it with you on the day of your appointment.  We will review it with you.  Also, please bring a complete list of all your medications or, if you prefer, bring the medication bottles and we will list them.  Please bring your insurance card so that we may make a copy of it.  If your insurance requires a referral to see a specialist, please bring your referral form from your primary care physician.  Co-payments are due at the time of your visit and may be paid by cash, check or credit card.     Your office visit will consist of a consult with your physician (includes a physical exam), any laboratory testing he/she may order, scheduling of any necessary diagnostic testing (e.g. x-ray, ultrasound, CT-scan), and scheduling of a procedure (e.g. Endoscopy, Colonoscopy) if required.  Please allow enough time on your schedule to allow for any/all of these possibilities.    If you cannot keep your appointment, please call [phone] to cancel or reschedule prior to your appointment date.  This allows [place] the opportunity to schedule an appointment for another patient in need of care.  If you do not cancel or reschedule by 5 p.m. the business day prior to your appointment date, you will be charged a $50.00 late cancellation/no-show fee.    Thank you for choosing  [facility] for your medical needs.  We appreciate the opportunity to care for you.  Please visit [place] at our website  to learn more about our practice.                     Sincerely,                                                             The Gastroenterology Division

## 2010-10-26 ENCOUNTER — Ambulatory Visit (INDEPENDENT_AMBULATORY_CARE_PROVIDER_SITE_OTHER): Payer: Medicare Other | Admitting: Emergency Medicine

## 2010-10-26 ENCOUNTER — Encounter: Payer: Self-pay | Admitting: Emergency Medicine

## 2010-10-26 DIAGNOSIS — J449 Chronic obstructive pulmonary disease, unspecified: Secondary | ICD-10-CM

## 2010-10-27 ENCOUNTER — Telehealth (INDEPENDENT_AMBULATORY_CARE_PROVIDER_SITE_OTHER): Payer: Self-pay | Admitting: *Deleted

## 2010-11-02 LAB — BASIC METABOLIC PANEL
BUN: 18 mg/dL (ref 6–23)
Chloride: 111 mEq/L (ref 96–112)
Creatinine, Ser: 1.1 mg/dL (ref 0.4–1.5)
GFR calc Af Amer: >60 mL/min (ref >60)
GFR calc non Af Amer: >60 mL/min (ref >60)
Potassium: 3.6 mEq/L (ref 3.5–5.1)

## 2010-11-02 LAB — CBC
HCT: 44.4 % (ref 39.0–52.0)
MCV: 100 fL (ref 78.0–100.0)
Platelets: 167 10*3/uL (ref 150–400)
RBC: 4.44 MIL/uL (ref 4.22–5.81)
RDW: 12.4 % (ref 11.5–15.5)
WBC: 15.3 10*3/uL — ABNORMAL HIGH (ref 4.0–10.5)

## 2010-11-03 LAB — DIFFERENTIAL
Basophils Absolute: 0 10*3/uL (ref 0.0–0.1)
Basophils Relative: 0 % (ref 0–1)
Eosinophils Absolute: 0.1 10*3/uL (ref 0.0–0.7)
Eosinophils Relative: 1 % (ref 0–5)
Monocytes Absolute: 1.2 10*3/uL — ABNORMAL HIGH (ref 0.1–1.0)

## 2010-11-03 LAB — POCT CARDIAC MARKERS: Myoglobin, poc: 102 ng/mL (ref 12–200)

## 2010-11-03 LAB — COMPREHENSIVE METABOLIC PANEL
ALT: 27 U/L (ref 0–53)
AST: 27 U/L (ref 0–37)
Albumin: 3.6 g/dL (ref 3.5–5.2)
Alkaline Phosphatase: 58 U/L (ref 39–117)
CO2: 21 mEq/L (ref 19–32)
Chloride: 107 mEq/L (ref 96–112)
GFR calc Af Amer: >60 mL/min (ref >60)
GFR calc non Af Amer: >60 mL/min (ref >60)
Potassium: 4.1 mEq/L (ref 3.5–5.1)
Total Bilirubin: 1.1 mg/dL (ref 0.3–1.2)

## 2010-11-03 LAB — CULTURE, BLOOD (ROUTINE X 2)
Culture  Setup Time: 201112140610
Culture: NO GROWTH

## 2010-11-03 LAB — CK TOTAL AND CKMB (NOT AT ARMC)
CK, MB: 1.9 ng/mL (ref 0.3–4.0)
CK, MB: 2.3 ng/mL (ref 0.3–4.0)
Relative Index: INVALID (ref 0.0–2.5)
Relative Index: INVALID (ref 0.0–2.5)
Total CK: 63 U/L (ref 7–232)
Total CK: 82 U/L (ref 7–232)

## 2010-11-03 LAB — CBC
Hemoglobin: 18 g/dL — ABNORMAL HIGH (ref 13.0–17.0)
MCH: 34.4 pg — ABNORMAL HIGH (ref 26.0–34.0)
Platelets: 177 10*3/uL (ref 150–400)
RBC: 5.23 MIL/uL (ref 4.22–5.81)
WBC: 14.6 10*3/uL — ABNORMAL HIGH (ref 4.0–10.5)

## 2010-11-03 LAB — CULTURE, RESPIRATORY W GRAM STAIN: Culture: NORMAL

## 2010-11-03 LAB — EXPECTORATED SPUTUM ASSESSMENT W GRAM STAIN, RFLX TO RESP C

## 2010-11-03 LAB — TROPONIN I
Troponin I: 0.01 ng/mL (ref 0.00–0.06)
Troponin I: 0.02 ng/mL (ref 0.00–0.06)

## 2010-11-03 NOTE — Progress Notes (Signed)
Summary: Hydromet Refill<<<done  Phone Note Call from Patient   Caller: Patient Call For: BYRUM Summary of Call: PT WANTS REFILL OF [place]. [place] [facility] [place]. PT CELL # [phone] Initial call taken by: Cooper Render, CNA,  October 27, 2010 11:08 AM  Follow-up for Phone Call        Pt requesting rx of Hydromet.  Rx last given on 08/11/2010 #240 mL x 0.  Last OV 10/26/10.  [place] [facility]  Dr. Lamonte Sakai, is this ok?   Raymondo Band RN  October 27, 2010 3:26 PM  Yes OK to call it in. Collene Gobble MD  October 27, 2010 5:07 PM   Follow-up by: Collene Gobble MD,  October 27, 2010 5:07 PM  Additional Follow-up for Phone Call Additional follow up Details #1::        RX called to pharmacy and pt is aware. Additional Follow-up by: Francesca Jewett CMA,  October 27, 2010 5:14 PM    Prescriptions: HYDROMET 5-1.5 MG/5ML SYRP (HYDROCODONE-HOMATROPINE) 1-2 tsp every 4-6 hr as needed cough  #260m x 0   Entered by:   LFrancesca JewettCMA   Authorized by:   RCollene GobbleMD   Signed by:   LFrancesca JewettCMA on 10/27/2010   Method used:   Telephoned to ...       [facility]       [address]      [place]  [address]      Ph: [phone]      Fax: [phone]  RxID:   [id number]

## 2010-11-03 NOTE — Assessment & Plan Note (Signed)
Summary: COPD, sinusitis, rhinitis   Visit Type:  Follow-up Primary Provider/Referring Provider:  Dr Nino Glow  CC:  COPD.  Patient says his breathing is better during the day...worse at night when lying down and thinks he needs to go back on Advair...trouble sleeping...cough has improved.  ELEVATED BP.Marland Kitchen  History of Present Illness: Bruce Travis is a 76 year old gentleman with a history of COPD, remote laryngeal cancer with some dysphagia, allergic rhinitis, and esophageal reflux. He has also been followed for chronic nodular granulomatous disease and bronchiectasis on CT scan chest, stable on CT for 2 + yrs.    ROV 04/03/10 -- returns for f/u COPD, allergic rhinitis, GERD, hx laryngeal CA. Recent treatment for acute sinusitis with Augmentin, also added atrovent NS to his other allergy regimen. He feels better, HA improved, mucous is now clear. Breathing has been good. Recently started on doxycycline to treat a dermatitis.   ROV 07/22/10 -- COPD, sinus disease, allergies. Old granulomatous disease. Taking clarinex, nasonex, mucinex DM. Quit atrovent nose spray. Taking Advair two times a day, rare ProAir use. No exacerbations since last visit. Doesn't rinse mouth out after the Advair, has thrush.   08/19/10-Presents for post hospital visit. Admitted   08/04/2010- 08/07/2010 for COPD exacerbation and PNA -bilateral lower lobe PNA. Tx w/ Rocephin and Zithromax. along w/ steroid taper and nebs. Discharged on [place]. Returns today w/ persistent cough with thick green mucus and dyspnea.    ROV 09/08/10 -- follow up for COPD, chronic rhinitis, allergies, chronic sinusitis, old granulomatous disease. Still has clear nasal drainage, sometimes yellow, green, blood-tinged. Has been rx for possible exacerbation. Using NSW's, never tried atrovent nasal spray. He is on Advair, he isn't sure that it helps him.  08/19/10-Presents for post hospital visit. Admitted   08/04/2010- 08/07/2010 for COPD exacerbation and PNA  -bilateral lower lobe PNA. Tx w/ Rocephin and Zithromax. along w/ steroid taper and nebs. Discharged on [place]. Returns today w/ persistent cough with thick green mucus and dyspnea.    ROV 09/08/10 -- follow up for COPD, chronic rhinitis, allergies, chronic sinusitis, old granulomatous disease.   ROV 10/26/10 -- COPD, cough/rhinitis, sinusitis. Started atrovent nasal spray three times a day, it helped with nasal drainage. We also stopped Advair to see if it would help cough, throat. He wants to go back better.   Preventive Screening-Counseling & Management  Alcohol-Tobacco     Smoking Status: quit     Packs/Day: 4.0     Year Started: 1950     Year Quit: 1990  Current Medications (verified): 1)  Advair Diskus 250-50 Mcg/dose  Misc (Fluticasone-Salmeterol) .... Inhale 1 Puff Two Times A Day 2)  Singulair 10 Mg  Tabs (Montelukast Sodium) .... Take 1 Tablet By Mouth Once A Day 3)  Proair Hfa 108 (90 Base) Mcg/act Aers (Albuterol Sulfate) .... Inhale 2 Puffs Every 4 Hours As Needed 4)  Nasonex 50 Mcg/act  Susp (Mometasone Furoate) .... As Needed 5)  Adult Aspirin Low Strength 81 Mg  Tbdp (Aspirin) .... Take 1 Tablet By Mouth Once A Day 6)  Vitamin C 1000 Mg  Tabs (Ascorbic Acid) .... Take 1 Tablet By Mouth Twice A Day 7)  Prevacid 24hr 15 Mg Cpdr (Lansoprazole) .Marland Kitchen.. 1 By Mouth Two Times A Day 8)  Fish Oil 1000 Mg  Caps (Omega-3 Fatty Acids) .... Take 1 Tablet By Mouth Twice A Day 9)  Diovan 80 Mg Tabs (Valsartan) .Marland Kitchen.. 1 By Mouth Once Daily 10)  Atorvastatin Calcium 40 Mg Tabs (Atorvastatin  Calcium) .Marland Kitchen.. 1 By Mouth Daily 11)  Clarinex 5 Mg Tabs (Desloratadine) .... Once Daily 12)  Hydromet 5-1.5 Mg/63m Syrp (Hydrocodone-Homatropine) ..Marland Kitchen. 1-2 Tsp Every 4-6 Hr As Needed Cough 13)  Mucinex Dm 30-600 Mg Xr12h-Tab (Dextromethorphan-Guaifenesin) ..Marland Kitchen. 1 By Mouth Two Times A Day 14)  Co-Enzyme Q-10 10 Mg Caps (Coenzyme Q10) .... Take 1 Capsule By Mouth Two Times A Day 15)  Atrovent 0.03 % Soln  (Ipratropium Bromide) .... 2 Sprays Each Nostril Three Times A Day  Allergies (verified): 1)  ! Codeine  Vital Signs:  Patient profile:   76year old male Height:      67 inches (170.18 cm) Weight:      152.25 pounds (69.20 kg) BMI:     23.93 O2 Sat:      93 % on Room air Temp:     97.6 degrees F (36.44 degrees C) oral Pulse rate:   75 / minute BP sitting:   176 / 82  (right arm) Cuff size:   regular  Vitals Entered By: LFrancesca JewettCMA (October 26, 2010 4:35 PM)  O2 Sat at Rest %:  93 O2 Flow:  Room air CC: COPD.  Patient says his breathing is better during the day...worse at night when lying down and thinks he needs to go back on Advair...trouble sleeping...cough has improved.  ELEVATED BP. Comments Medications reviewed with patient LFrancesca JewettCMA  October 26, 2010 4:36 PM   Physical Exam  General:  well developed, well nourished, in no acute distress Head:  normocephalic and atraumatic Nose:  clear nasal discharge, no sinus tenderness Mouth:  mild erythema Neck:  no JVD.   Chest Wall:  no deformities noted Lungs:  Clear, no wheeze Heart:  regular rate and rhythm, S1, S2 without murmurs, rubs, gallops, or clicks Abdomen:  not examined Msk:  no deformity or scoliosis noted with normal posture Extremities:  no clubbing, cyanosis, edema, or deformity noted Neurologic:  nonfocal Cervical Nodes:  no significant adenopathy Psych:  alert and cooperative; normal mood and affect; normal attention span and concentration   Medications Added to Medication List This Visit: 1)  Atorvastatin Calcium 40 Mg Tabs (Atorvastatin calcium) ..Marland Kitchen. 1 by mouth daily  Patient Instructions: 1)  Restart Advair two times a day  2)  Continue to have ProAir available to use as needed  3)  Stop atrovent nasal spray. If your nasal drainage returns then restart at two times a day.  4)  Restart nasonex 2 sprays each nostril once daily  5)  Follow up with Dr BLamonte Sakaiin 3 months or as needed    Prescriptions: ADVAIR DISKUS 250-50 MCG/DOSE  MISC (FLUTICASONE-SALMETEROL) Inhale 1 puff two times a day  #1 x 11   Entered and Authorized by:   RCollene GobbleMD   Signed by:   RCollene GobbleMD on 10/26/2010   Method used:   [facility] to        [facility]       [id number]      [place]  [address]      Ph: [phone]      Fax: [phone]  RxID:   [id number]

## 2010-11-12 ENCOUNTER — Ambulatory Visit (INDEPENDENT_AMBULATORY_CARE_PROVIDER_SITE_OTHER): Payer: Medicare Other | Admitting: Gastroenterology

## 2010-11-12 ENCOUNTER — Encounter: Payer: Self-pay | Admitting: Gastroenterology

## 2010-11-12 ENCOUNTER — Other Ambulatory Visit (INDEPENDENT_AMBULATORY_CARE_PROVIDER_SITE_OTHER): Payer: Medicare Other

## 2010-11-12 DIAGNOSIS — K219 Gastro-esophageal reflux disease without esophagitis: Secondary | ICD-10-CM

## 2010-11-12 DIAGNOSIS — K902 Blind loop syndrome, not elsewhere classified: Secondary | ICD-10-CM

## 2010-11-12 LAB — IBC PANEL
Iron: 102 ug/dL (ref 42–165)
Saturation Ratios: 30.8 % (ref 20.0–50.0)

## 2010-11-12 LAB — FOLATE: Folate: 14.4 ng/mL (ref >5.9)

## 2010-11-12 LAB — VITAMIN B12: Vitamin B-12: 267 pg/mL (ref 211–911)

## 2010-11-12 MED ORDER — LANSOPRAZOLE 15 MG PO CPDR: 15.0000 mg | DELAYED_RELEASE_CAPSULE | Freq: Every day | ORAL | Status: DC

## 2010-11-12 MED ORDER — ALIGN PO CAPS: 1.0000 | ORAL_CAPSULE | Freq: Every day | ORAL | Status: DC

## 2010-11-12 NOTE — Progress Notes (Signed)
History of Present Illness:  This is a  76 year old Caucasian male with severe chronic obstructive lung disease referred by Dr. Eula Flax  For evaluation of abdominal gas, bloating, distention, and low-grade abdominal discomfort. [place]  As COPD and mild right-sided heart failure related to years of cigarette smoking. He is Bentyl twice a day lansoprazole per Dr. Duwayne Heck pulmonary per  A possible association of GERD with pulmonary fibrosis. Apparently the patient was hospitalized in December with pneumonia all vital multiple antibiotics, Since that time he had gas, bloating, and mild constipation  Without melena or hematochezia. He does use a large amount of sorbitol and fructose in his diet with gum and breath mint cannot see where he is at previous endoscopic exam,s. He denies anorexia or weight loss. Last colonoscopy in 2008 was unremarkable except for mild diverticulosis. I cannot see he has had previous endoscopy. In any case, he denies reflux symptoms, dysphagia, ready hepatobiliary complaints. Does have a history of hypertension, hypercholesterolemia, and kidney stones. Apparently he has also had surgery for throat cancer. ROS: The remainder of the 8 point ROS is negative. He does have allergic sinusitis, chronic cough, chronic insomnia. He has shortness of breath with exertion but can walk several blocks without difficulty. No history of ascites, pancreatitis, hepatitis, other gastrointestinal issues. His pulmonary fibrosis history of the multiple inhalers and other medications as listed in his chart. He has mild cor pulmonale. Past Medical History  Diagnosis Date  . Personal history of colonic polyps 04/26/2007    hyperplastic   . Diverticulosis of colon (without mention of hemorrhage)   . Family hx of colon cancer   . COPD (chronic obstructive pulmonary disease)   . Esophageal reflux   . Hypertension   . Hyperlipemia   . CAD (coronary artery disease)   . Throat cancer   . Other diseases of  lung, not elsewhere classified   . Unspecified asthma    Past Surgical History  Procedure Date  . Vasectomy   . Head and neck surgery   . 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 Hemorrhoid surgery     reports that he quit smoking about 21 years ago. His smoking use included Cigarettes. He smoked 2 packs per day. He does not have any smokeless tobacco history on file. He reports that he does not drink alcohol or use illicit drugs. family history includes Colon cancer in his father; Heart disease in his father; Prostate cancer in his father; and Stroke in his mother. Allergies  Allergen Reactions  . Codeine       Physical Exam: General  He is in no acute distress but as appearance of chronic emphysema. Eyes PERRLA, no icterus fundoscopic exam per opthamologist Skin no lesions noted Neck supple, no adenopathy, no thyroid enlargement, no tenderness Chest  Markedly diminished breath sounds bilaterally without wheezes or rhonchi. Heart no significant murmurs, gallops or rubs noted Abdomen no hepatosplenomegaly masses or tenderness, BS normal.  . Extremities no acute joint lesions, edema, phlebitis or evidence of cellulitis. Neurologic patient oriented x 3, cranial nerves intact, no focal neurologic deficits noted. Psychological mental status normal and normal affect.  Assessment and plan: Mr. Batson has severe COPD and mild cor pulomnae  It is a poor candidate for conscious sedation and endoscopic exams. His GI complaints C. 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 I. The limited these substances from his diet placed him on probiotic therapy with  office followup in several weeks' time.  Anemia profile with B12 level celiac serologies ordered. I see no need for followup colonoscopy at this time.

## 2010-11-12 NOTE — Patient Instructions (Signed)
Decrease your Prevacid to once a day. Please go to the basement today for your labs.  You were given a gas handout and a artificial sweeteners handout.  Take the Align for 3 weeks.

## 2010-11-13 ENCOUNTER — Other Ambulatory Visit: Payer: Self-pay | Admitting: Gastroenterology

## 2010-11-13 ENCOUNTER — Telehealth: Payer: Self-pay | Admitting: *Deleted

## 2010-11-13 ENCOUNTER — Encounter: Payer: Self-pay | Admitting: *Deleted

## 2010-11-13 DIAGNOSIS — E538 Deficiency of other specified B group vitamins: Secondary | ICD-10-CM

## 2010-11-13 LAB — GLIA (IGA/G) + TTG IGA: Gliadin IgA: 26.3 U/mL — ABNORMAL HIGH (ref <20)

## 2010-11-13 MED ORDER — CYANOCOBALAMIN 1000 MCG/ML IJ SOLN: 1000.0000 ug | INTRAMUSCULAR | Status: DC

## 2010-11-13 NOTE — Telephone Encounter (Signed)
Error

## 2010-11-13 NOTE — Telephone Encounter (Signed)
Message copied by Bernita Buffy on Fri Nov 13, 2010  2:10 PM ------      Message from: Sharlett Iles, DAVID      Created: Fri Nov 13, 2010  8:27 AM       Start B12 shots

## 2010-11-13 NOTE — Telephone Encounter (Signed)
Message copied by Bernita Buffy on Fri Nov 13, 2010  1:53 PM ------      Message from: Sharlett Iles, DAVID      Created: Fri Nov 13, 2010  8:27 AM       Start B12 shots

## 2010-11-13 NOTE — Telephone Encounter (Signed)
Notified pt and appt made on Monday .

## 2010-11-13 NOTE — Telephone Encounter (Signed)
Message copied by Barb Merino on Fri Nov 13, 2010  2:29 PM ------      Message from: Sharlett Iles, DAVID      Created: Fri Nov 13, 2010  8:27 AM       Start B12 shots

## 2010-11-16 ENCOUNTER — Ambulatory Visit (INDEPENDENT_AMBULATORY_CARE_PROVIDER_SITE_OTHER): Payer: Medicare Other | Admitting: Gastroenterology

## 2010-11-16 DIAGNOSIS — E538 Deficiency of other specified B group vitamins: Secondary | ICD-10-CM

## 2010-11-16 MED ORDER — CYANOCOBALAMIN 1000 MCG/ML IJ SOLN
1000.0000 ug | INTRAMUSCULAR | Status: AC
  Administered 2010-11-16 – 2010-11-23 (×2): 1000 ug via INTRAMUSCULAR

## 2010-11-23 ENCOUNTER — Ambulatory Visit (INDEPENDENT_AMBULATORY_CARE_PROVIDER_SITE_OTHER): Payer: Medicare Other | Admitting: Gastroenterology

## 2010-11-23 DIAGNOSIS — E538 Deficiency of other specified B group vitamins: Secondary | ICD-10-CM

## 2010-12-04 ENCOUNTER — Ambulatory Visit: Payer: Medicare Other | Admitting: Gastroenterology

## 2010-12-15 ENCOUNTER — Encounter: Payer: Self-pay | Admitting: Gastroenterology

## 2010-12-15 ENCOUNTER — Ambulatory Visit (INDEPENDENT_AMBULATORY_CARE_PROVIDER_SITE_OTHER): Payer: Medicare Other | Admitting: Gastroenterology

## 2010-12-15 VITALS — BP 112/60 | HR 60 | Ht 67.0 in | Wt 147.0 lb

## 2010-12-15 DIAGNOSIS — K900 Celiac disease: Secondary | ICD-10-CM

## 2010-12-15 NOTE — Patient Instructions (Signed)
The Dietician will contact you with an appt.  Follow the Celiac diet given to you today. Continue to get your monthly b12 injections at [facility].

## 2010-12-15 NOTE — Progress Notes (Signed)
History of Present Illness: This is a 76 year old white male with multiple GI complaints recently treated for possible bacterial overgrowth syndrome. As part of his workup for celiac antibodies were markedly positive. Past history of what sounds like dermatitis herpetiformis. He also has multiple pulmonary complaints as per my previous notes. His current complaints are abdominal gas, bloating, and bowel irregularity. He denies a history of known iron deficiency or osteoporosis. He is on B12 replacement therapy.    Current Medications, Allergies, Past Medical History, Past Surgical History, Family History and Social History were reviewed in [employer] record.   Assessment and plan: Referral to dietary therapy for celiac--gluten-free diet. We have prescribed nasal B12 gel weekly office followup in 3 months time.   Please copy her primary care physician, referring physician, and pertinent subspecialists. Also Dr. Calton Golds [facility] dermatology and Dr. Jenna Luo No diagnosis found.

## 2011-01-05 ENCOUNTER — Encounter: Payer: Medicare Other | Attending: Gastroenterology | Admitting: *Deleted

## 2011-01-05 DIAGNOSIS — K900 Celiac disease: Secondary | ICD-10-CM | POA: Insufficient documentation

## 2011-01-05 NOTE — Assessment & Plan Note (Signed)
[facility]                             PULMONARY OFFICE NOTE   LEV, CERVONE                           MRN:          [id number]  DATE:01/06/2007                            DOB:          05-27-1935    This is a very pleasant 76 year old gentleman whom I follow here for  chronic obstructive pulmonary disease with asthmatic bronchitic  component.  He also has issues with gastroesophageal reflux, post-nasal  drip, and chronic nasal congestion.  In addition, the patient has  chronic scarring on both upper lobes, left greater than right.  This has  been a chronic issue.  He also has emphysematous changes.  He is  followed carefully in this regard due to prior history of laryngeal  cancer status post epiglottis removal.  His ENT physician was  Leonides Sake. [name], M.D.   CURRENT MEDICATIONS:  As noted on the intake sheet.  These have been  reviewed and are accurate.   PHYSICAL EXAM:  VITAL SIGNS:  As noted.  Oxygen saturation is 92% on  room air.  GENERAL:  This is a well-developed, well-nourished gentleman who is in  no acute distress.  HEENT:  Examination is unremarkable for age.  The patient speaks with a  hoarse wet voice.  This is a chronic finding.  NECK:  Supple.  No adenopathy noted.  No JVD.  LUNGS:  Clear to auscultation bilaterally.  He is actually moving air  very well.  CARDIAC:  Regular rate and rhythm.  No rubs, murmurs, or gallops heard.  EXTREMITIES:  The patient has no cyanosis, clubbing, or edema noted.   IMPRESSION:  1. Chronic obstructive pulmonary disease with asthmatic bronchitic      component.  The patient is actually well-compensated on his current      regimen.  2. History of gastroesophageal reflux, also well compensated.  3. Rhinitis.  Maintain on Singulair.  The patient is intolerant of      antihistamines.   PLAN:  1. Therefore, will be for the patient to continue medications as they      currently are.  2.  Note is made that his last CT was done on May 15, that is      yesterday, and this shows changes with chronic scarring as      previously.  Reveals old granulomatous disease finding.  3. Followup will be in 2 months' time with Dr. Baltazar Apo.  He is to      contact us prior to that time should any problems arise.     [name], MD  Electronically Signed    CLG/MedQ  DD: 01/06/2007  DT: 01/06/2007  Job #: [id number]

## 2011-01-08 NOTE — Op Note (Signed)
NAME:  Bruce Travis, Bruce Travis                              ACCOUNT NO.:  [id number]   MEDICAL RECORD NO.:  [id number]                   PATIENT TYPE:  AMB   LOCATION:  DAY                                  FACILITY:  [facility]   PHYSICIAN:  Isabel Caprice. Hassell Done, M.D.             DATE OF BIRTH:  Jan 16, 1935   DATE OF PROCEDURE:  05/06/2004  DATE OF DISCHARGE:                                 OPERATIVE REPORT   PROCEDURE:  Procedure for prolapsed hemorrhoids (PPH).   SURGEON:  Isabel Caprice. Hassell Done, M.D.   ASSISTANT:  Marland Kitchen T. Hoxworth, M.D.   ANESTHESIA:  General endotracheal.   HISTORY:  [name] is a 76 year old gentleman whose had problems with  painful hemorrhoids and prolapse for some time.  He was seen in the office  on April 14, 2004 with this problem and we discussed procedures for  hemorrhoids. He wanted to undergo hemorrhoidectomy as he had been coping  with his problem for a while.  We discussed PPH procedure to try to cut down  on his hemorrhoidal prolapse. He is aware of the risk of bleeding, infection  and the residual external hemorrhoids that might be present.   Mr. Cross had an adequate rectal prep preop.  He was seen in the holding area  and the procedure was further discussed with him today.  He was then taken  back to room 11 where he was given general anesthesia and rolled in the  prone jackknife position.  The buttock cheeks were taped apart and he was  prepped widely with Betadine inside and out and draped sterilely.  First I  did digital gentle dilatation and then injected the sphincter mechanism with  about 20 mL of a mixture of Marcaine, lidocaine and Wydase.  This was in a  9:1 ratio.  An anal inspection was performed indicating the aforementioned  internal hemorrhoids with minimal external components.  I then used the long  rectal retractor with the obturator in and out as we moved around to place  the pursestring suture.  I began this anteriorly and was exactly 5 cm above  the dentate line with this.  Good overlap occurred between purchases of  mucosa, submucosa.  When this was completed, we then brought in the [facility] kit,  dilator and anal retractor with the pursestring suture coming out.  I put my  finger above the pursestring and it seemed to be a nice uniform pursestring.  The [place] device was then passed and it seemed to pop above the pursestring  and the pursestring was at that point tied down.  The device was then closed  and at some point, the patient had some problems with bucking.  We kept the  device closed at that point for two minutes for hemostasis and fired the  device and then kept it closed for another minute.  As it was closed, it  went  up into the rectal canal and was at 4 cm. The 4 cm mark was right at  the edge of the anal retractor as it should be. After firing, this was  removed and we had a nice uniform ring of tissue at least a centimeter  sleeve that contained mucosa, submucosa and no muscle.  The staple line was  just above the dentate line and the device actually had been placed below  the pursestring which was then cut out and removed.  The staple line was  inspected and it was not bleeding. It was uniform. I massaged Betadine  ointment into the area and then a surgical absorbent pad was placed in the  anorectal canal.  The patient seemed to tolerate the procedure well and was  taken to the recovery room.  He will be given Percocet 7.5/325 as needed for  pain and will be assessed for discharge or overnight observation.                                              Isabel Caprice Hassell Done, M.D.   MBM/MEDQ  D:  05/06/2004  T:  05/06/2004  Job:  [id number]

## 2011-01-08 NOTE — Assessment & Plan Note (Signed)
[facility]                               PULMONARY OFFICE NOTE   Bruce Travis, Bruce Travis                           MRN:          [id number]  DATE:04/06/2006                            DOB:          1934/12/09    This is a very pleasant 76 year old white male who I see her for chronic  obstructive pulmonary disease with asthmatic bronchitic component.  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  He has chronic scarring and nodules on the  right upper lobe.   The patient presents today with no complaints.  He really needs refills on  his medications.  He denies any cough, sputum production, or any other  symptomatology.  His weight has been stable.  Appetite is good.   CURRENT MEDICATIONS:  As noted on the intake sheet.  1. __________ inhalation twice a day.  2. Singulair 10 mg daily.   INCOMPLETE DICTATION.                                   [name], MD   CLG/MedQ  DD:  04/06/2006  DT:  04/06/2006  Job #:  [id number]

## 2011-01-08 NOTE — Assessment & Plan Note (Signed)
[facility]                             PULMONARY OFFICE NOTE   ANIRUDH, BAIZ                           MRN:          [id number]  DATE:11/24/2006                            DOB:          May 16, 1935    HISTORY OF PRESENT ILLNESS:  The patient is a 76 year old white male  patient of Dr. Patsey Berthold who has a known history of COPD with an  asthmatic bronchitic component.  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  The  patient returns today complaining that he, over the last 4 days, he has  had increased cough, congestion, and wheezing.  The patient recently had  a slow to resolve asthmatic bronchitic flare 2 weeks ago and was given  antibiotics, which he states symptoms totally resolved.  However, he  continued to have some post-nasal drip symptoms and nasal congestion.  Last visit:  The patient was seen 1 week ago by Dr. Patsey Berthold and tried  on Xyzal.  The patient reports he was unable to tolerate due to  weakness, fatigue, and depression-like symptoms.  The patient denies any  hemoptysis, orthopnea, PND, or leg swelling.  I did call in for the on-  call physician, who called the patient in a Z-Pak and prednisone  yesterday, which he has already started.   PAST MEDICAL HISTORY:  Reviewed.   CURRENT MEDICATIONS:  Reviewed.   PHYSICAL EXAM:  The patient is an elderly male, in no acute distress.  He is afebrile with stable vital signs.  O2 saturation is 98% on room  air.  HEENT:  Nasal mucosa slightly pale.  Nontender sinuses.  Posterior  oropharynx reveals some scattered white patches.  NECK:  Supple without cervical adenopathy.  No JVD.  LUNGS:  Coarse breath sounds with a few scattered wheezes.  CARDIAC:  Regular rate and rhythm.  ABDOMEN:  Soft and nontender.  EXTREMITIES:  Warm without any edema.   IMPRESSION AND PLAN:  1. Asthmatic bronchitic exacerbation.  The patient is to finish  Z-Pak      as recommended.  Taper off prednisone.  The patient will start      Mucinex DM twice daily and will use Nasonex 2 puffs in the morning      and add in Astelin nasal spray 2 puffs at bedtime.  The patient      will continue on AcipHex twice daily for aggressive reflux      prevention and return back here in 2 weeks or sooner if needed.  2. Oral candidiasis.  The patient is to begin Diflucan 100 mg over the      next week.  The patient is recommended to rinse and gargle well      after Advair use.      Rexene Edison, NP  Electronically Signed      [name], MD  Electronically Signed   TP/MedQ  DD: 11/24/2006  DT: 11/24/2006  Job #: [id number]

## 2011-01-08 NOTE — Assessment & Plan Note (Signed)
[facility]                               PULMONARY OFFICE NOTE   GERVIS, GABA                           MRN:          [id number]  DATE:07/11/2006                            DOB:          1935/04/07    HISTORY OF PRESENT ILLNESS:  The patient is a 76 year old white male,  patient of Dr. Domingo Dimes, who has history of COPD and asthmatic bronchitis  returns related to persistent cough, congestion and wheezing.  The patient  was seen 4 days ago by Dr. Patsey Berthold for an acute tracheal bronchitis.  He  was treated with Levaquin 750 x5 days.  The patient reports that his  purulent sputum has improved; however, he continues to have significant  coughing paroxysms, wheezing.  Patient is leaving on vacation to [place] in  the morning on a flight and is concerned that he  continues to have  significant coughing.  He denies any hemoptysis, chest pain, orthopnea, PND  or leg swelling.   PAST MEDICAL HISTORY:  Reviewed.   CURRENT MEDICATIONS:  Reviewed   PHYSICAL EXAMINATION:  GENERAL:  Patient is a pleasant male in no acute  distress.  VITAL SIGNS:  He is afebrile with stable vital signs.  HEENT:  Is unremarkable.  NECK:  Is supple with no adenopathy.  No JVD.  LUNGS:  His lung sounds reveal coarse breath sounds with a few expiratory  wheezes.  CARDIAC:  Regular rate.  ABDOMEN:  Soft and benign.  EXTREMITIES:  Warm without any edema.   IMPRESSION/PLAN:  Acute chronic obstructive pulmonary disease and asthmatic  bronchitic exacerbation, slow to resolve.  Patient is to finish his  antibiotic as prescribed.  He will begin a prednisone taper over the next  week.  He may use Endal HD as needed for cough #8 ounces with no refills  given.  Patient is aware of sedating effect.  Patient will return with Dr.  Patsey Berthold as scheduled or sooner if needed.     Rexene Edison, NP  Electronically Signed      [name], MD  Electronically Signed   TP/MedQ  DD: 07/11/2006  DT: 07/11/2006  Job #: [id number]   cc:   [name], MD

## 2011-01-08 NOTE — Discharge Summary (Signed)
[place]  Patient:    Bruce Travis, Bruce Travis                           MRN: [id number] Adm. Date:  [id number] Disc. Date: 09/30/99 Attending:  Lily Lovings Dictator:   Sharyl Nimrod, P.A.C. CC:         Bruce Travis. Stanford Breed, M.D. [facility]                           Discharge Summary  DATE OF BIRTH:  1935-03-16  SUMMARY OF HISTORY:  Bruce Travis is a 76 year old white male who was referred for evaluation of an abnormal exercise Cardiolite performed earlier on September 30, 1999.  He did not have prior history of heart disease; however, he as hospitalized at [facility] for new onset left-sided chest discomfort associated with dizziness and left upper extremity numbness.  Enzymes were negative.  Beta blockers were added but subsequently discontinued secondary to bradycardia and AV block.  During the treadmill, he did develop some chest tightness similar to what he had over the weekend.  He exercised a total of 6 minutes utilizing the Bruce protocol, achieving 84% predicted maximum heart rate with a hypertensive response with a baseline blood pressure 176/117 to 213/130.  Imagings were consistent with a profusion defect, thus he was admitted for a cardiac catheterization.  He has a history of remote throat cancer and COPD. Recent chest x-ray does show a 6 mm right lower lobe nodule, likely granuloma.  Question paratracheal adenopathy.  Followup CT was recommended.  He has not smoked in 10 years.  LABORATORY DATA:  H&H is 18.0 and 50.0, normal indices, platelets 237, WBC 6.7.  Sodium 138, potassium 3.4, BUN 17, creatinine 1.1, glucose 121.  EKG:  Normal sinus rhythm, nonspecific ST-T wave changes, left axis deviation, nd also sinus bradycardia.  X-rays not on the chart.  HOSPITAL COURSE:  Bruce Travis was admitted and underwent cardiac catheterization on October 01, 1999 by Dr. Olevia Perches.  This revealed a 30% proximal LAD lesion followed by some  irregularities.  His ejection fraction was 50%.  He was placed in the REVERSAL trial.  His incision was closed with Perclose and post his bed rest he was ambulating without difficulty.  Catheterization site was intact.  The patient insisted upon going home and Dr. Olevia Perches approved.  Thus, the patient was discharged with a diagnosis of noncardiac chest discomfort, history as previously described.  DISPOSITION:  He is discharged home.  DISCHARGE MEDICATIONS:  He was placed in the REVERSAL trial.  He is not supposed to take any cholesterol medications, as these will be started in the office.  He was asked to continue his aspirin 325 q.d. and Flovent as needed.  DISCHARGE INSTRUCTIONS:  He was instructed no lifting over 10 pounds for one week. No lifting, driving, heavy exertion, or sexual activity for two days.  If he had any problems with his catheterization site, he was asked to call immediately. e was also advised no tub baths or soaking.  The research nurse will call him when to schedule the baseline visit to go into the REVERSAL trial.  FOLLOW-UP:  He was asked to call our office and arrange a followup appointment ith Dr. Stanford Breed. DD:  10/01/99 TD:  10/01/99 Job: [id number]

## 2011-01-08 NOTE — Cardiovascular Report (Signed)
[place]  Patient:    Bruce Travis, Bruce Travis Visit Number: [id number] MRN: [id number]          Service Type: Attending:  Vanna Scotland. Olevia Perches, M.D. [facility] Dictated by:   Vanna Scotland. Olevia Perches, M.D. [facility] Proc. Date: 05/04/01   CC:         Clois Dupes, M.D.  Denice Bors Stanford Breed, M.D. [facility]  Cardiopulmonary Laboratory   Cardiac Catheterization  PROCEDURES PERFORMED: Cardiac catheterization and intravascular ultrasound.  CLINICAL HISTORY: The patient is a 76 year old employee at the [employer], who is an avid Scientist, research (physical sciences).  He underwent catheterization a year and a half ago for chest pain and was found to have minimal disease. He was enrolled in the REVERSAL trial and underwent IVUS of the LAD, which had about a 30% proximal lesion. He has been on study drugs since that time, and now returns for a followup REVERSAL catheterization.  DESCRIPTION OF PROCEDURE: The procedure was performed via the right femoral artery using an arterial sheath and 6 French preformed coronary catheters.  A front wall arterial puncture was performed and Omnipaque contrast was used. We used a JL6 for injection of the circumflex artery which came off of a separate ostium. We used a JL4, 7 [place], guiding catheter with side holes for injection in the LAD and for performing the IVUS.  A 3.2 Ultra-Cross was used.  After given intracoronary nitroglycerin, a luge wire was passed down the LAD without difficulty.  A 3.2 Ultra-Cross was passed down the LAD moving the transducer to the midportion of the vessel after two septal perforators but before a moderate sized diagonal branch.  Automatic pullback was then performed back to the left main.  Repeat diagnostic studies were performed through the guiding catheter.  The patient tolerated the procedure well and left the laboratory in satisfactory condition.  RESULTS: There was no left main coronary artery since there was  separate ostia.  Left anterior descending: The left anterior descending artery gave rise to a small diagonal branch, two septal perforators, and a moderate sized diagonal branch.  There was 30% narrowing before the first septal perforator.  There were irregularities in the mid vessel but there was no significant obstruction.  Circumflex artery: The circumflex artery which arose from a separate ostium gave rise to a marginal branch and AV branch, which terminated in the posterolateral branch.  These vessels were free of significant disease.  Right coronary artery: The right coronary is a small to moderate sized vessel that gave rise to a conus branch, right ventricular branch, posterior descending branch, and a posterolateral branch.  This vessel was free of significant disease.  LEFT VENTRICULOGRAPHY: The left ventriculogram was performed in the RAO projection showed fairly good wall motion.  There was marked furrowing with some prolapse of the mitral valve.  There was no significant regurgitation. The estimated ejection fraction was 50%.  The IVUS run showed the vessel size in the mid vessel was about 3.0 and the proximal vessel was about 4.0.  The vessel was entirely clean throughout much of its course with some mild plaque in the proximal area.  CONCLUSIONS: 1. Minimal nonobstructive coronary artery disease with 30% narrowing in the    proximal left anterior descending and no evidence of progression. 2. Intravascular ultrasound as part of the REVERSAL trial with minimal    luminal plaque. Dictated by:   Vanna Scotland Olevia Perches, M.D. [place] Attending:  Vanna Scotland Olevia Perches, M.D. [facility] DD:  05/04/01 TD:  05/04/01 Job: [id number]

## 2011-01-08 NOTE — Assessment & Plan Note (Signed)
[facility]                             PULMONARY OFFICE NOTE   IRAN, ROWE                           MRN:          [id number]  DATE:11/17/2006                            DOB:          [birth year]    This is a very pleasant 76 year old gentleman who follows here for  chronic obstructive pulmonary disease.  He has recently been treated for  exacerbation.  He had CT scan in the earlier part of February because of  suspected new nodule.  However, no evidence of malignancy was seen on  followup CT.  He will need followup in 3 months' time to ensure that the  CT is stable.  The patient has all granulomatous disease and scarring of  both apices.  He does have a history of head and neck cancer and for  this reason, close followup is warranted.   He continues to have some issues with cough, still producing some mucoid  yellowish sputum in the mornings, but otherwise unremarkable and  nonproductive.  He denies any gastroesophageal reflux symptoms.  He is  maintained on AcipHex.  He has noted, however, increased difficulties  with nasal congestion and he is indeed on Singulair for chronic rhinitis  but this has not been helping fully.  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  The patient denies any  other symptomatology.   CURRENT MEDICATIONS:  As noted on the intake sheet.   PHYSICAL EXAMINATION:  VITAL SIGNS:  As noted.  Oxygen saturation is 92%  on room air.  GENERAL:  This is a well-developed, thin gentleman who is in no acute  distress.  HEENT:  Unremarkable.  NECK:  Supple.  No adenopathy noted, no JVD.  LUNGS:  Clear to auscultation bilaterally.  He is moving air very well.  CARDIAC:  Regular rate and rhythm.  No rubs, murmurs or gallops heard.  EXTREMITIES:  The patient has no cyanosis, no clubbing, no edema noted.   IMPRESSION:  1. Chronic obstructive pulmonary disease with recent exacerbation.  2. Cough,  likely secondary to postnasal drip.  I cannot exclude      potential gastroesophageal reflux.  3. History of granulomatous disease with apical scarring as well.   PLAN:  1. Increase the patient's AcipHex to one tablet in the morning and one      tablet in the evening.  He will do this for 2 weeks and then      decrease to once a day.  2. The patient will be given a trial of Xyzal 5 mg at bedtime to see      if this helps his postnasal drip and also his cough.  3. For his granulomatous disease and apical scarring, will continue to      monitor that closely.      The patient will need repeat CT in or around May 2008.  4. Followup within 4-6 weeks' time.  He is to contact us prior to that      time should any new problems arise.  [name], MD  Electronically Signed    CLG/MedQ  DD: 11/17/2006  DT: 11/17/2006  Job #: [id number]

## 2011-01-08 NOTE — Assessment & Plan Note (Signed)
[facility]                             PULMONARY OFFICE NOTE   Bruce Travis, Bruce Travis                           MRN:          [id number]  DATE:11/08/2006                            DOB:          [birth year]    HISTORY OF PRESENT ILLNESS:  The patient is 76 year old, white, male  patient of Dr. Domingo Dimes who has a known history of COPD with an  asthmatic bronchitic component. 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 The  patient presents today for acute office visit complaining of a 3-day  history of nasal congestion, production cough with thick green-yellow  sputum and wheezing. The patient denies any hemoptysis, orthopnea, PND  or leg swelling.   PAST MEDICAL HISTORY:  Reviewed.   CURRENT MEDICATIONS:  Reviewed.   PHYSICAL EXAMINATION:  GENERAL:  The patient is a pleasant male in no  acute distress.  VITAL SIGNS:  He is afebrile with stable vital signs. O2 saturation  is  90% on room air  HEENT:  Unremarkable.  NECK:  Supple without cervical adenopathy.  LUNGS:  Lung sounds reveal coarse breath sounds without any wheezing or  crackles.  CARDIAC:  Regular rate.  ABDOMEN:  Soft and nontender.  EXTREMITIES:  Warm without any edema.   IMPRESSION/PLAN:  Acute chronic obstructive pulmonary disease and  asthmatic/bronchitic flare. The patient is to be on Levaquin 750 mg x5  days. The patient is to hold fish oil until cough resolves. Add Mucinex  DM twice daily. The patient may also use Endal HD #8 ounces 1-2  teaspoons every 4-6 h as needed for cough. The patient is aware of the  sedating effect. The patient will return back with Dr. Patsey Berthold as  scheduled or sooner if needed.      Rexene Edison, NP  Electronically Signed      [name], MD  Electronically Signed   TP/MedQ  DD: 11/08/2006  DT: 11/09/2006  Job #: [id number]

## 2011-01-08 NOTE — Cardiovascular Report (Signed)
[place]  Patient:    Bruce Travis, Bruce Travis                           MRN: [id number] Proc. Date: 10/01/99 Adm. Date:  [id number] Attending:  Lily Lovings CC:         Robyn Haber, M.D.             Bruce Alfonso Patten Olevia Perches, M.D. [facility]             Denice Bors. Stanford Breed, M.D. [facility]             Cardiac Catheterization Laboratory                        Cardiac Catheterization  CLINICAL HISTORY:  Mr. Chancellor is 76 years old and was recently evaluated by Dr. Stanford Breed for chest pain.  He underwent stress only Cardiolite scan which showed an inferior defect and he developed recurrent chest pain and was admitted to the hospital urgently.  He also had some symptoms of dizziness.  He was evaluated with carotid Dopplers which reportedly showed no major obstruction.  DESCRIPTION OF PROCEDURE:  The procedure was performed via the right femoral artery using an arterial sheath and 6 French preformed coronary catheter.  A front wall arterial puncture was performed and Omnipaque contrast was used.  At the completion of the diagnostic study, the patient underwent IVUS measurement as part of the REVERSAL protocol.  He was given 50 units of heparin per kilogram to increase the ACT to greater than 200 seconds.  We used a 7 [place] guiding catheter and a  short floppy wire and passed the wire down the LAD without problems.  We did an  automatic pullback with a 3.2 Ultra-Cross.  We began the passage in the proximal to mid LAD just before the second septal perforator.  There was a moderate amount f plaque just proximal to the first septal perforator encompassing maybe 20-25% of the lumen.  He tolerated the procedure well and left the lab in satisfactory condition.  RESULTS:  The aortic pressure was 138/76 with a mean of 97.  Left ventricular pressure is 138/27.  The left main coronary artery:  The left main coronary artery was absent. There was separate ostia of the  LAD and circumflex arteries.  Left anterior descending:  The LAD gave rise to four septal perforators and two  diagonal branches.  There was 30% narrowing before the first septal perforator nd some mild irregularity in the mid vessel.  There were no other major obstructions.  Circumflex artery:  The circumflex artery gave rise to a large marginal branch nd an AV branch.  These vessels were free of significant disease.  Right coronary artery:  The right coronary is a large vessel that gave rise to  conus branch, right ventricular branch, posterior descending branch and a posterolateral branch.  These vessels were free of significant disease.  LEFT VENTRICULOGRAPHY:  The left ventriculogram was performed in the RAO projection showed mild global hypokinesis with an estimated ejection fraction of 50%.  The IVUS run showed mild plaque in the proximal LAD.  CONCLUSIONS:  Nonobstructive coronary artery disease with 30% narrowing in the proximal left anterior descending, no significant obstruction in the circumflex and right coronary arteries and mild global hypokinesis.  RECOMMENDATIONS:  Reassurance.  Etiology of the dizzy spells is not clear. This may represent TIA of defibrillator  problem.  Dr. Stanford Breed will follow up on this.  ADDENDUM:  We had to use a JL6 in order to selectively inject the circumflex artery for the diagnostic study. DD:  10/01/99 TD:  10/01/99 Job: [id number]

## 2011-01-08 NOTE — Assessment & Plan Note (Signed)
[facility]                               PULMONARY OFFICE NOTE   MAURY, GRONINGER                           MRN:          [id number]  DATE:04/21/2006                            DOB:          Sep 13, 1934    HISTORY OF PRESENT ILLNESS:  The patient is a very pleasant 76 year old  white male patient of Patsey Berthold, who has a known history of COPD and  asthmatic bronchitis, who presents with a 3-day history of increased nasal  congestion, cough with thick yellow mucus.  The patient denies any  hemoptysis, chest pain, orthopnea, PND or leg swelling.  The patient is  maintained on Advair 250/50 mcg twice daily.   PHYSICAL EXAMINATION:  GENERAL:  The patient is a pleasant male in no acute  distress.  VITAL SIGNS:  Temperature is 99.3, blood pressure 122/78, O2 saturation 92%  on room air.  HEENT:  Posterior pharynx with moderate erythema.  NECK:  Supple without adenopathy.  CHEST:  Lung sounds reveal coarse breath sounds bilaterally without any  wheezing.  CARDIAC:  Regular rate and rhythm.  ABDOMEN:  Soft.  EXTREMITIES:  Warm without any edema.   IMPRESSION AND PLAN:  Acute tracheobronchitis.  The patient was given  doxycycline x7 days, Mucinex DM twice a day, Ental HC as needed for cough.  The patient was given a Xopenex nebulizer treatment in the office.  The  patient will return with Dr. Patsey Berthold as scheduled or sooner if needed.                                   Rexene Edison, NP                                [name], MD   TP/MedQ  DD:  04/21/2006  DT:  04/22/2006  Job #:  [id number]

## 2011-01-08 NOTE — Assessment & Plan Note (Signed)
[name]                               PULMONARY OFFICE NOTE   Bruce Travis, Bruce Travis                           [name]  DATE:07/07/2006                            DOB:          12/07/1934    [name], who follows  up on chronic obstructive pulmonary disease with asthmatic/bronchitic  component.  He also has issues with chronic granulomas of the lung.  The  patient presents today as an acute visit with cough, productive of greenish  sputum, for the last two to three days prior to this evaluation.  He denies  any fevers, chills or sweats.  He has had increasing dyspnea.  He denies any  chest pain.   CURRENT MEDICATIONS:  As noted on the intake sheet.  These have been  reviewed and are accurate.   PHYSICAL EXAMINATION:  VITAL SIGNS:  Noted oxygen saturation was 91% on room  air.  IN GENERAL:  This is a well-developed, spry gentleman, who is in no acute  distress.  He is fully ambulatory.  HEENT EXAMINATION:  Unremarkable.  NECK:  The neck is supple.  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  LUNGS:  Clear with occasional rhonchi.  Otherwise unremarkable.  CARDIAC EXAMINATION:  Regular rate and rhythm.  No rubs, murmurs or gallops.  EXTREMITIES:  The patient has no cyanosis, no clubbing, no edema noted.   We did obtain a chest x-ray today, which shows no acute infiltrate.   IMPRESSION:  Acute tracheobronchitis with mild chronic obstructive pulmonary  disease exacerbation.   PLAN:  1. The plan, therefore, will be to provide the patient with Levaquin 750      mg daily times five days.  Samples were provided for the patient.  2. Continue medications as they currently are.  3. Followup will be in two to four weeks' time.  He is to contact us prior      to that time, should any new problems arise.     [name], MD  Electronically Signed    CLG/MedQ  DD: 07/08/2006  DT: 07/08/2006  Job #: [id number]

## 2011-01-08 NOTE — Assessment & Plan Note (Signed)
[facility]                             PULMONARY OFFICE NOTE   Bruce Travis, Bruce Travis                           MRN:          [id number]  DATE:10/06/2006                            DOB:          [birth year]    This is a 76 year old gentleman whom I follow here for chronic  obstructive pulmonary disease with asthmatic bronchitic component.  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  The patient presents today for followup.  He has actually  been doing relatively well since December.  He did have an episode in  November where he had an exacerbation and required Levaquin.  Since  then, he has been feeling markedly better.  His only complaint today is  that he cannot afford Atacand and requires different blood pressure  medication due to his insurance not covering the Atacand.   CURRENT MEDICATIONS:  As noted on the intake sheet.  These have been  reviewed and are accurate.   PHYSICAL EXAM:  VITAL SIGNS:  Noted.  Oxygen saturation is 90% on room  air.  GENERAL:  This is a well-developed, well-nourished gentleman who is in  no acute distress.  HEENT:  Examination is unremarkable.  NECK:  Supple.  No adenopathy noted.  No JVD.  LUNGS:  Clear to auscultation bilaterally.  CARDIAC:  Regular rate and rhythm.  No rubs, murmurs, or gallops heard.  EXTREMITIES:  No cyanosis, clubbing, or edema noted.   IMPRESSION:  1. Chronic obstructive pulmonary disease with asthmatic bronchitic      component.  2. History of pulmonary granulomas and scarring.   PLAN:  1. The patient is to undergo a chest x-ray today.  We reviewed this      and it looked essentially unchanged, but given the fact that there      is a questionable new area of opacity, we will go ahead and obtain      a CT scan.  2. Because his Atacand is not covered by his insurance company, and      because we are trying to avoid ACE inhibitors due to chronic    hoarseness after head and neck surgery, we will place the patient      on Cozaar 50 mg daily.  3. Followup will be in 2 months' time.  He is to contact us prior to      that time should any problems arise.     [name], MD  Electronically Signed    CLG/MedQ  DD: 10/06/2006  DT: 10/06/2006  Job #: [id number]

## 2011-01-08 NOTE — Discharge Summary (Signed)
[place]  Patient:    Bruce Travis                            MRN: [id number] Adm. Date:  [id number] Disc. Date: [id number] Attending:  Alvie Heidelberg Dictator:   Ermalinda Barrios, P.A.C. CC:         [employer]; Attn:  Delia Chimes, P.A.                  Referring Physician Discharge [place]  ADMITTING DIAGNOSIS:  Chest pain.  DISCHARGE DIAGNOSES: 1. Chest pain; myocardial infarction ruled out with negative CK-MBs.  Plan    outpatient stress Cardiolite. 2. Bradycardia with dizziness while on Lopressor -- stopped. 3. Long history of emphysema. 4. History of throat cancer. 5. History of hyperlipidemia. 6. Recent bronchitis.  BRIEF HISTORY:  Please see H&P for details.  This is a 76 year old married white male without prior cardiac history.  Risk factors include heavy tobacco abuse, hyperlipidemia, age and gender.  On the day prior to admission, he had occasional dizzy spells until lunchtime, relieved with rest.  He felt better in the afternoon. On the morning of admission, he woke up with left upper extremity numbness and discomfort and shortly after, developing left-sided chest pressure.  Pain lasted several hours, no immediate relief with nitroglycerin, although pain subsided with aspirin, heparin and nitroglycerin in the emergency room.  HOSPITAL COURSE:  MI was ruled out with negative CK-MBs.  He was placed on a beta blocker and did have A-V block and bradycardia and Lopressor had to be stopped;  bradycardia down in the 30s.  There was no further evidence of significant bradycardia, down to 55 after the Lopressor was stopped.  Carotid Dopplers showed no ICA stenosis; vertebral artery flow antegrade.  Patient was feeling better. CKs and troponins were negative and Dr. Lattie Haw felt he could have probable transient compression of an artery with his left arm numbness and felt he was stable for discharge and follow  up with stress Cardiolite in the office in the morning.  LABORATORY AND X-RAY FINDINGS:  CKs, MBs and troponins negative.  Coagulation times kept approximately two times control while on IV heparin.  Hemoglobin 15, hematocrit 44.  Total cholesterol was 218, triglycerides 210, HDL 34, LDL 142.  Chest x-ray showed 6-mm nodule at the right lung base.  Given its density, this  likely represents a granuloma.  There is adjacent mild pleural thickening.  He lso had tapering of the peripheral pulmonary vasculature, most consistent with emphysema, and likely a bulla on the right upper lobe.  There was prominence of the right peritracheal soft tissues which may be vascular in nature, but which could represent adenopathy.  Given the constellation of findings and lack of old x-rays, CT scan should be considered.  He also had bibasilar subsegmental atelectasis. On discussing this with the patient, he says he has had a chronic nodule and has had chest x-ray and CAT scan approximately one year ago as an outpatient at this facility and he will follow up with Delia Chimes to reschedule a repeat CAT scan to follow this up.  EKG:  Normal sinus rhythm.  Left axis deviation.  No acute change.  DISPOSITION:  Patient is discharged home in stable condition on the following medications:  DISCHARGE MEDICATIONS: 1. Flovent two puffs b.i.d. 2. Coated aspirin once a day.  FOLLOWUP:  Stress Cardiolite is scheduled at  12:30 on September 30, 1999 on the second floor in our office.  He is to be n.p.o. four hours prior to the test and he has a followup appointment with Dr. Carlena Bjornstad on October 12, 1999 at 10:30. He was actually admitted by Dr. Lattie Haw but was seen in the hospital by Dr. Ron Parker and it is easier for him to go to the [facility] office. DD:  09/29/99 TD:  09/29/99 Job: [id number]

## 2011-01-08 NOTE — Assessment & Plan Note (Signed)
[name]                                 ON-CALL NOTE   Bruce Travis, Bruce Travis                           MRN:          [id number]  DATE:11/23/2006                            DOB:          1934-10-17    Mr. Sitter is a patient with a history of remote throat cancer, followed  by Dr. Derrill Kay in our office for chronic obstructive pulmonary  disease and chronic bronchitis. He is maintained on Advair 250/50, 1  inhalation b.i.d., and albuterol on an as needed basis which he rarely  uses. He tells me that he began to have upper respiratory type symptoms  about 2 days ago with a runny nose and copious clear nasal drainage, as  well as some low grade fever. For the last two days, he has begun to  notice some wheezing and dyspnea with exertion. Today, he is short of  breath with almost any activity. He has used his albuterol once  yesterday and once today. He is coughing up gray phlegm with increased  frequency and in an increased amount over the last two days. He denies  any chest pain or hemoptysis.   IMPRESSION:  [name] is having an early exacerbation of his chronic  obstructive pulmonary disease in the setting of an upper respiratory  infection. We discussed initiating therapy now with plans to see him in  the office tomorrow versus evaluating him in the emergency department  tonight. He feels that he is okay to be seen in our office tomorrow, but  understands that if he worsens in any way, that he is to call me, or to  proceed directly to the emergency department for complete evaluation  tonight. I will call in prescriptions for azithromycin and for a  prednisone taper to the [facility] on [address]. In the morning, I will  arrange outpatient follow up for him as soon as possible.   TELEPHONE NUMBER:  His cell phone is [phone].     Collene Gobble, MD  Electronically Signed    RSB/MedQ  DD: 11/23/2006  DT: 11/24/2006  Job #: [id number]   cc:   [name], MD

## 2011-03-11 ENCOUNTER — Telehealth: Payer: Self-pay | Admitting: Emergency Medicine

## 2011-03-11 NOTE — Telephone Encounter (Signed)
Spoke with pt. He states that he has had worsening cough x several wks- now prod with green/yellow sputum. I advised needs appt for eval, and overdue for followup. OV with TP for tomorrow at 10:30 am. I advised seek emergency care sooner if needed and pt verbalized understanding.

## 2011-03-12 ENCOUNTER — Ambulatory Visit (INDEPENDENT_AMBULATORY_CARE_PROVIDER_SITE_OTHER): Payer: Medicare Other | Admitting: Adult Health

## 2011-03-12 ENCOUNTER — Ambulatory Visit (INDEPENDENT_AMBULATORY_CARE_PROVIDER_SITE_OTHER)
Admission: RE | Admit: 2011-03-12 | Discharge: 2011-03-12 | Disposition: A | Discharge status: Home / Self Care | Payer: Medicare Other | Source: Ambulatory Visit | Attending: Adult Health | Admitting: Adult Health

## 2011-03-12 ENCOUNTER — Telehealth: Payer: Self-pay | Admitting: Emergency Medicine

## 2011-03-12 ENCOUNTER — Other Ambulatory Visit (INDEPENDENT_AMBULATORY_CARE_PROVIDER_SITE_OTHER): Payer: Medicare Other

## 2011-03-12 ENCOUNTER — Encounter: Payer: Self-pay | Admitting: Adult Health

## 2011-03-12 VITALS — BP 136/80 | HR 74 | Temp 97.0°F | Ht 67.0 in | Wt 140.4 lb

## 2011-03-12 DIAGNOSIS — J984 Other disorders of lung: Secondary | ICD-10-CM

## 2011-03-12 DIAGNOSIS — J441 Chronic obstructive pulmonary disease with (acute) exacerbation: Secondary | ICD-10-CM

## 2011-03-12 LAB — BASIC METABOLIC PANEL
BUN: 17 mg/dL (ref 6–23)
Calcium: 9.2 mg/dL (ref 8.4–10.5)
GFR: 72.53 mL/min (ref >60.00)
Potassium: 4.3 mEq/L (ref 3.5–5.1)

## 2011-03-12 MED ORDER — DOXYCYCLINE HYCLATE 100 MG PO TABS: 100.0000 mg | ORAL_TABLET | Freq: Two times a day (BID) | ORAL | Status: AC

## 2011-03-12 MED ORDER — CLOTRIMAZOLE 10 MG MT TROC: OROMUCOSAL | Status: AC

## 2011-03-12 MED ORDER — TIOTROPIUM BROMIDE MONOHYDRATE 18 MCG IN CAPS: 18.0000 ug | ORAL_CAPSULE | Freq: Every day | RESPIRATORY_TRACT | Status: DC

## 2011-03-12 MED ORDER — HYDROCODONE-HOMATROPINE 5-1.5 MG/5ML PO SYRP: 5.0000 mL | ORAL_SOLUTION | ORAL | Status: DC | PRN

## 2011-03-12 MED ORDER — IOHEXOL 300 MG/ML  SOLN
80.0000 mL | Freq: Once | INTRAMUSCULAR | Status: AC | PRN
  Administered 2011-03-12: 80 mL via INTRAVENOUS

## 2011-03-12 NOTE — Assessment & Plan Note (Signed)
Exacerbation with associated upper airway irritation -?Advair [place] Slot  Plan:  Doxycycline 156m Twice daily  For 7 days. Take with food, avoid sun while on this  Mucinex DM Twice daily  As needed  Cough Mycelex troche five times daily for 1 week.  Stop Advair  Begin Spiriva Handihaler 1 puff daily , rinse after use.  follow up in 2 weeks. And As needed   We are setting you up for a CT scan to follow your nodules.  Hold fish oil for 1 month then restart -but place in freezer.  Hydromet 1-2 tsp every 4-6 hr As needed  Cough  Please contact office for sooner follow up if symptoms do not improve or worsen or seek emergency care

## 2011-03-12 NOTE — Telephone Encounter (Signed)
Pt seen by TP in office today and requests to change from Dr Lamonte Sakai to either Dr Gwenette Greet or Dr Joya Gaskins.  Dr Lamonte Sakai, please advise if okay for pt to switch.  Thanks.

## 2011-03-12 NOTE — Patient Instructions (Addendum)
Doxycycline 149m Twice daily  For 7 days. Take with food, avoid sun while on this  Mucinex DM Twice daily  As needed  Cough Mycelex troche five times daily for 1 week.  Stop Advair  Begin Spiriva Handihaler 1 puff daily , rinse after use.  follow up in 2 weeks. And As needed   We are setting you up for a CT scan to follow your nodules.  Hold fish oil for 1 month then restart -but place in freezer.  Hydromet 1-2 tsp every 4-6 hr As needed  Cough  Please contact office for sooner follow up if symptoms do not improve or worsen or seek emergency care

## 2011-03-12 NOTE — Progress Notes (Signed)
Subjective:    Patient ID: Bruce Travis, male    DOB: 12-03-1934, 76 y.o.   MRN: [id number]  HPI Bruce Travis is a 76 year old gentleman with a history of COPD, remote laryngeal cancer with some dysphagia, allergic rhinitis, and esophageal reflux. He has also been followed for chronic nodular granulomatous disease and bronchiectasis on CT scan chest, stable on CT for 2 + yrs (2009) .  ROV 04/03/10 -- returns for f/u COPD, allergic rhinitis, GERD, hx laryngeal CA. Recent treatment for acute sinusitis with Augmentin, also added atrovent NS to his other allergy regimen. He feels better, HA improved, mucous is now clear. Breathing has been good. Recently started on doxycycline to treat a dermatitis.   ROV 07/22/10 -- COPD, sinus disease, allergies. Old granulomatous disease. Taking clarinex, nasonex, mucinex DM. Quit atrovent nose spray. Taking Advair two times a day, rare ProAir use. No exacerbations since last visit. Doesn't rinse mouth out after the Advair, has thrush.   08/19/10-Presents for post hospital visit. Admitted 08/04/2010- 08/07/2010 for COPD exacerbation and PNA -bilateral lower lobe PNA. Tx w/ Rocephin and Zithromax. along w/ steroid taper and nebs. Discharged on [place]. Returns today w/ persistent cough with thick green mucus and dyspnea.   ROV 09/08/10 -- follow up for COPD, chronic rhinitis, allergies, chronic sinusitis, old granulomatous disease. Still has clear nasal drainage, sometimes yellow, green, blood-tinged. Has been rx for possible exacerbation. Using [facility]'s, never tried atrovent nasal spray. He is on Advair, he isn't sure that it helps him.   08/19/10-Presents for post hospital visit. Admitted 08/04/2010- 08/07/2010 for COPD exacerbation and PNA -bilateral lower lobe PNA. Tx w/ Rocephin and Zithromax. along w/ steroid taper and nebs. Discharged on [place]. Returns today w/ persistent cough with thick green mucus and dyspnea.   ROV 09/08/10 -- follow up for COPD, chronic rhinitis,  allergies, chronic sinusitis, old granulomatous disease.   ROV 10/26/10 -- COPD, cough/rhinitis, sinusitis. Started atrovent nasal spray three times a day, it helped with nasal drainage. We also stopped Advair to see if it would help cough, throat. He wants to go back better.   03/12/2011 Acute OV  Pt presents for an acute work in visit. Complains of increased cough with clear to yellow mucus.   Says he feels "gurgling" in his chest when he lays down at night. Says cough never goes completely away. Aggravating him all the times. OTC cough products do work.  More wheezing last few days. Last CT chest 2010 showed stable granulomatous dz. Stable nodules, he is due for follow up CT scan to follow nodules.  No weight loss, no hemoptysis.    Review of Systems Constitutional:   No  weight loss, night sweats,  Fevers, chills, fatigue, or  lassitude.  HEENT:   No headaches,  Difficulty swallowing,  Tooth/dental problems, or  Sore throat,                No sneezing, itching, ear ache, nasal congestion, post nasal drip,   CV:  No chest pain,  Orthopnea, PND, swelling in lower extremities, anasarca, dizziness, palpitations, syncope.   GI  No heartburn, indigestion, abdominal pain, nausea, vomiting, diarrhea, change in bowel habits, loss of appetite, bloody stools.   Resp:   No coughing up of blood.    No chest wall deformity  Skin: no rash or lesions.  GU: no dysuria, change in color of urine, no urgency or frequency.  No flank pain, no hematuria   MS:  No joint pain or swelling.  No decreased range of motion.     Psych:  No change in mood or affect. No depression or anxiety.            Objective:   Physical Exam GEN: A/Ox3; pleasant , NAD, thin   HEENT:  [place]/AT,  EACs-clear, TMs-wnl, NOSE-clear, THROAT-erythematous, few white/candidia patches , no postnasal drip or exudate noted.   NECK:  Supple w/ fair ROM; no JVD; normal carotid impulses w/o bruits; no thyromegaly or nodules palpated; no  lymphadenopathy.  RESP  Coarse BS w/ few exp wheezes no accessory muscle use, no dullness to percussion  CARD:  RRR, no m/r/g  , no peripheral edema, pulses intact, no cyanosis or clubbing.  GI:   Soft & nt; nml bowel sounds; no organomegaly or masses detected.  Musco: Warm bil, no deformities or joint swelling noted.   Neuro: alert, no focal deficits noted.    Skin: Warm, no lesions or rashes         Assessment & Plan:

## 2011-03-15 ENCOUNTER — Telehealth: Payer: Self-pay | Admitting: Emergency Medicine

## 2011-03-15 NOTE — Telephone Encounter (Signed)
Spoke with pt and he is just wanting results of his CT Chest done 03/12/11. I advised RB out of the office this wk and TP off today, will forward to TP to address when returns tomorrow. Pt okay with this. Please advise, thanks!

## 2011-03-16 NOTE — Telephone Encounter (Signed)
Discussed with pt

## 2011-03-17 NOTE — Telephone Encounter (Signed)
Per TP, MW okay with assuming pt care.  Needs ov in 2-3 weeks.  LMOM TCB x1 to schedule appt with MW.  Brant[place] Stage available is 8.13.12 which TP says is okay.

## 2011-03-17 NOTE — Telephone Encounter (Signed)
Pt returned call - appt scheduled w/ MW 8.16.12 @ 0930.  Pt okay with this date and time.

## 2011-03-30 ENCOUNTER — Ambulatory Visit: Payer: Medicare Other | Admitting: Adult Health

## 2011-04-02 ENCOUNTER — Encounter: Payer: Self-pay | Admitting: Adult Health

## 2011-04-02 ENCOUNTER — Telehealth: Payer: Self-pay | Admitting: Internal Medicine

## 2011-04-02 ENCOUNTER — Other Ambulatory Visit (HOSPITAL_COMMUNITY): Payer: Self-pay | Admitting: Adult Health

## 2011-04-02 ENCOUNTER — Telehealth: Payer: Self-pay | Admitting: *Deleted

## 2011-04-02 ENCOUNTER — Ambulatory Visit (INDEPENDENT_AMBULATORY_CARE_PROVIDER_SITE_OTHER): Payer: Medicare Other | Admitting: Adult Health

## 2011-04-02 VITALS — BP 126/74 | HR 71 | Temp 97.1°F | Ht 67.0 in | Wt 140.8 lb

## 2011-04-02 DIAGNOSIS — J449 Chronic obstructive pulmonary disease, unspecified: Secondary | ICD-10-CM

## 2011-04-02 DIAGNOSIS — R490 Dysphonia: Secondary | ICD-10-CM

## 2011-04-02 DIAGNOSIS — R05 Cough: Secondary | ICD-10-CM

## 2011-04-02 DIAGNOSIS — J984 Other disorders of lung: Secondary | ICD-10-CM

## 2011-04-02 MED ORDER — PREDNISONE 10 MG PO TABS: ORAL_TABLET | ORAL | Status: AC

## 2011-04-02 MED ORDER — HYDROCODONE-HOMATROPINE 5-1.5 MG/5ML PO SYRP: ORAL_SOLUTION | ORAL | Status: DC

## 2011-04-02 NOTE — Assessment & Plan Note (Signed)
Once cough is improved will need to do spirometry to look at FEV1 Cont off Advair  Cont on Spiriva for now - monitor urinary symptoms closely.  Please contact office for sooner follow up if symptoms do not improve or worsen or seek emergency care

## 2011-04-02 NOTE — Patient Instructions (Addendum)
We are setting you up for a Modified Barium Swallow to evaluate swallowing.  Would like for you to see Dr. [name] for your hoarseness/cough  Prednisone taper over next week.  Add Pepcid 11m At bedtime   Mucinex DM Twice daily  As needed  Cough Begin Spiriva Handihaler 1 puff daily , rinse after use.  Hydromet 1-2 tsp every 4-6 hr As needed  Cough  follow up Dr. WMelvyn Novas In 1-2 weeks and As needed   Please contact office for sooner follow up if symptoms do not improve or worsen or seek emergency care

## 2011-04-02 NOTE — Progress Notes (Signed)
Subjective:    Patient ID: Bruce Travis, male    DOB: [birth year], 76 y.o.   MRN: [id number]  HPI  Bruce Travis is a 76 year old gentleman with a history of COPD, remote laryngeal cancer with some dysphagia, allergic rhinitis, and esophageal reflux. He has also been followed for chronic nodular granulomatous disease and bronchiectasis on CT scan chest, stable on CT for 2 + yrs (2009) .  ROV 04/03/10 -- returns for f/u COPD, allergic rhinitis, GERD, hx laryngeal CA. Recent treatment for acute sinusitis with Augmentin, also added atrovent NS to his other allergy regimen. He feels better, HA improved, mucous is now clear. Breathing has been good. Recently started on doxycycline to treat a dermatitis.   ROV 07/22/10 -- COPD, sinus disease, allergies. Old granulomatous disease. Taking clarinex, nasonex, mucinex DM. Quit atrovent nose spray. Taking Advair two times a day, rare ProAir use. No exacerbations since last visit. Doesn't rinse mouth out after the Advair, has thrush.   08/19/10-Presents for post hospital visit. Admitted 08/04/2010- 08/07/2010 for COPD exacerbation and PNA -bilateral lower lobe PNA. Tx w/ Rocephin and Zithromax. along w/ steroid taper and nebs. Discharged on [place]. Returns today w/ persistent cough with thick green mucus and dyspnea.   ROV 09/08/10 -- follow up for COPD, chronic rhinitis, allergies, chronic sinusitis, old granulomatous disease.   ROV 10/26/10 -- COPD, cough/rhinitis, sinusitis. Started atrovent nasal spray three times a day, it helped with nasal drainage. We also stopped Advair to see if it would help cough, throat. He wants to go back better.   7/20 /2012 Acute OV  Pt presents for an acute work in visit. Complains of increased cough with clear to yellow mucus.   Says he feels "gurgling" in his chest when he lays down at night. Says cough never goes completely away. Aggravating him all the times. OTC cough products do work.  More wheezing last few days. Last CT chest 2010  showed stable granulomatous dz. Stable nodules, he is due for follow up CT scan to follow nodules. No weight loss, no hemoptysis. >>Doxycycline x 7 days , rx spiriva, and advair stopped CT chest   04/02/2011 Follow up  Pt returns for follow up . Last visit with COPD flare tx w/ doxycyline x 7 days . He says he is feeling better but cough has not went away.  Still having chest congestion with beige mucus, hoarseness. We stopped Advair and started Spiriva.  He has noticed some  difficulty urinating and having to strain-says it is mild and not to bothersome. No dysuria or discharge. No back pain.  Most aggravating symptom is persistent cough, esp at night. No fever or chest pain. He does have a hx of throat cancer in past >20 years ago. Followed by ENT Dr. [name] in past. Says he has intermittent "aspiration " with certain foods, He has changed his diet which has helped. He has had recurrent PNA in past .   Last visit. CT chest was done for lung nodule surveillance, which showed Severe centrilobular emphysema with areas of new and persistent  nodularity given the multiplicity of findings, these could represent areas of progressive scarring. One more underlying primary lung neoplasm(s) versus an unusual appearance of pulmonary metastasis cannot be excluded. If the the patient is a potential surgery or treatment candidate, PET should be considered for further evaluation. If this is not performed, short-term follow-up chest CT at 3 months would be recommended. 2. Lower lobe findings which may represent infection or aspiration. He  has an ov in 1 week to discuss with Dr. Melvyn Novas  .      Review of Systems  Constitutional:   No  weight loss, night sweats,  Fevers, chills, fatigue, or  lassitude.  HEENT:   No headaches,   Tooth/dental problems, or  Sore throat,                No sneezing, itching, ear ache, nasal congestion, post nasal drip,   CV:  No chest pain,  Orthopnea, PND, swelling in lower extremities,  anasarca, dizziness, palpitations, syncope.   GI  No , abdominal pain, nausea, vomiting, diarrhea, change in bowel habits, loss of appetite, bloody stools.   Resp:   No coughing up of blood.    No chest wall deformity  Skin: no rash or lesions.  GU: no dysuria, change in color of urine, no urgency or frequency.  No flank pain, no hematuria   MS:  No joint pain or swelling.  No decreased range of motion.     Psych:  No change in mood or affect. No depression or anxiety.            Objective:   Physical Exam  GEN: A/Ox3; pleasant , NAD, thin   HEENT:  [place]/AT,  EACs-clear, TMs-wnl, NOSE-clear, THROAT-erythematous,   no postnasal drip or exudate noted.   NECK:  Supple w/ fair ROM; no JVD; normal carotid impulses w/o bruits; no thyromegaly or nodules palpated; no lymphadenopathy.  RESP  Coarse BS w/ few exp wheezes no accessory muscle use, no dullness to percussion  CARD:  RRR, no m/r/g  , no peripheral edema, pulses intact, no cyanosis or clubbing.  GI:   Soft & nt; nml bowel sounds; no organomegaly or masses detected.  Musco: Warm bil, no deformities or joint swelling noted.   Neuro: alert, no focal deficits noted.    Skin: Warm, no lesions or rashes         Assessment & Plan:

## 2011-04-02 NOTE — Telephone Encounter (Signed)
ERROR

## 2011-04-02 NOTE — Assessment & Plan Note (Signed)
Chronic hoarseness s/p throat cancer in past >20years ago s/p resection  He does follow up with Dr. [name] -will have him see ENT for follow up for recurrent cough  Also set up for MBS w/ ST to evaluate for Dysphagia ? GERD/aspiration due to dysphagia from previous throat surgery.

## 2011-04-02 NOTE — Assessment & Plan Note (Signed)
Chronic granulamatous dz in past with stable CT in 2010.  Recent CT chest last month with increased areas ? Active infection/inflammation  Will repeat cXR today , since finished abx.  Set up for MBS ? Recurrent aspiration  He does have hx of smoking and throat cancer.  May need serial CT in 3 months for surveillance w/ hx of cancer/smoking.  Will discuss at ov with Dr. Melvyn Novas  In 1-2 weeks.

## 2011-04-02 NOTE — Telephone Encounter (Signed)
Our mistake.  rx's telephoned to pharmacist at [facility].

## 2011-04-02 NOTE — Telephone Encounter (Signed)
Called and spoke with pt and he is aware that Janett Billow j is calling in his meds for him .

## 2011-04-02 NOTE — Assessment & Plan Note (Signed)
Recurrent bronchitis with slow to resolve flare and chronic cough  Pt has underlying COPD ? Stage-was not able to find pFTs Will remain off advair for now.  Ritta Slot has resolved with mycelex.  tx w/ short burst of steroids.  May use mucinex dm As needed   GERD prevention with PPI , add pepcid At bedtime

## 2011-04-05 ENCOUNTER — Telehealth: Payer: Self-pay | Admitting: Adult Health

## 2011-04-05 DIAGNOSIS — R490 Dysphonia: Secondary | ICD-10-CM

## 2011-04-05 NOTE — Telephone Encounter (Signed)
Spoke with Ivin Booty who reports that the order for pt's modified barium swallow with speech therapy was placed incorrectly.  Should be [id number].  Unable to delete incorrect order as the note has already been signed.  Will re-enter order thru this phone note with a note that the MBS has already been scheduled for 8.17.12 @ 1000 at [facility] per [place].    Will sign off.

## 2011-04-07 NOTE — Progress Notes (Signed)
Subjective:    Patient ID: Bruce Travis, male    DOB: 18-Jun-1935, 76 y.o.   MRN: [id number]  HPI  68 yowm with a history of COPD, remote laryngeal cancer with some dysphagia, allergic rhinitis, and esophageal reflux. He has also been followed for chronic nodular granulomatous disease and bronchiectasis on CT scan chest, stable on CT for 2 + yrs (2009) .  ROV 04/03/10 -- returns for f/u COPD, allergic rhinitis, GERD, hx laryngeal CA. Recent treatment for acute sinusitis with Augmentin, also added atrovent NS to his other allergy regimen. He feels better, HA improved, mucous is now clear. Breathing has been good. Recently started on doxycycline to treat a dermatitis.   ROV 07/22/10 -- COPD, sinus disease, allergies. Old granulomatous disease. Taking clarinex, nasonex, mucinex DM. Quit atrovent nose spray. Taking Advair two times a day, rare ProAir use. No exacerbations since last visit. Doesn't rinse mouth out after the Advair, has thrush.   08/19/10-Presents for post hospital visit. Admitted 08/04/2010- 08/07/2010 for COPD exacerbation and PNA -bilateral lower lobe PNA. Tx w/ Rocephin and Zithromax. along w/ steroid taper and nebs. Discharged on [place]. Returns today w/ persistent cough with thick green mucus and dyspnea.   ROV 09/08/10 -- follow up for COPD, chronic rhinitis, allergies, chronic sinusitis, old granulomatous disease.   ROV 10/26/10 -- COPD, cough/rhinitis, sinusitis. Started atrovent nasal spray three times a day, it helped with nasal drainage. We also stopped Advair to see if it would help cough, throat. He wants to go back better.   7/20 /2012 Acute OV  Pt presents for an acute work in visit. Complains of increased cough with clear to yellow mucus.   Says he feels "gurgling" in his chest when he lays down at night. Says cough never goes completely away. Aggravating him all the times. OTC cough products do work.  More wheezing last few days. Last CT chest 2010 showed stable granulomatous  dz. Stable nodules, he is due for follow up CT scan to follow nodules. No weight loss, no hemoptysis. >>Doxycycline x 7 days , rx spiriva, and advair stopped CT chest   04/02/2011 Follow up NP Pt returns for follow up . Last visit with COPD flare tx w/ doxycyline x 7 days . He says he is feeling better but cough has not went away.  Still having chest congestion with beige mucus, hoarseness. We stopped Advair and started Spiriva.  He has noticed some  difficulty urinating and having to strain-says it is mild and not to bothersome. No dysuria or discharge. No back pain.  Most aggravating symptom is persistent cough, esp at night. No fever or chest pain. He does have a hx of throat cancer in past >20 years ago. Followed by ENT Dr. [name] in past. Says he has intermittent "aspiration " with certain foods, He has changed his diet which has helped. He has had recurrent PNA in past .   Last visit. CT chest was done for lung nodule surveillance, which showed Severe centrilobular emphysema with areas of new and persistent  nodularity given the multiplicity of findings, these could represent areas of progressive scarring. One more underlying primary lung neoplasm(s) versus an unusual appearance of pulmonary metastasis cannot be excluded. If the the patient is a potential surgery or treatment candidate, PET should be considered for further evaluation. If this is not performed, short-term follow-up chest CT at 3 months would be recommended. 2. Lower lobe findings which may represent infection or aspiration. He has an ov in  1 week to discuss with Dr. Melvyn Novas  .  rec We are setting you up for a Modified Barium Swallow to evaluate swallowing.  Would like for you to see Dr. [name] for your hoarseness/cough  Prednisone taper over next week.  Add Pepcid 37m At bedtime   Mucinex DM Twice daily  As needed  Cough Begin Spiriva Handihaler 1 puff daily , rinse after use.  Hydromet 1-2 tsp every 4-6 hr As needed   Cough  04/08/2011 ov/[place] cc cough much better, sob only with heavy ex, rare need for albuterol. Finishing up prednisone.  No longer on fish oil.  W/u by NLucia Gaskinsand ST in progress.  Pt denies any significant sore throat, dysphagia, itching, sneezing,  nasal congestion or excess/ purulent secretions,  fever, chills, sweats, unintended wt loss, pleuritic or exertional cp, hempoptysis, orthopnea pnd or leg swelling.    Also denies any obvious fluctuation of symptoms with weather or environmental changes or other aggravating or alleviating factors.    Sleeping ok without nocturnal  or early am exac of resp c/o's or need for noct saba.             Objective:   Physical Exam  Hoarse amb wm nad Wt  140 04/08/2011  HEENT mild turbinate edema.  Oropharynx no thrush or excess pnd or cobblestoning.  No JVD or cervical adenopathy. Mild accessory muscle hypertrophy. Trachea midline, nl thryroid. Chest was hyperinflated by percussion with diminished breath sounds and moderate increased exp time without wheeze. Hoover sign positive at mid inspiration. Regular rate and rhythm without murmur gallop or rub or increase P2 or edema.  Abd: no hsm, nl excursion. Ext warm without cyanosis or clubbing.          Assessment & Plan:

## 2011-04-08 ENCOUNTER — Encounter: Payer: Self-pay | Admitting: Internal Medicine

## 2011-04-08 ENCOUNTER — Ambulatory Visit (INDEPENDENT_AMBULATORY_CARE_PROVIDER_SITE_OTHER): Payer: Medicare Other | Admitting: Internal Medicine

## 2011-04-08 DIAGNOSIS — J449 Chronic obstructive pulmonary disease, unspecified: Secondary | ICD-10-CM

## 2011-04-08 DIAGNOSIS — J984 Other disorders of lung: Secondary | ICD-10-CM

## 2011-04-08 DIAGNOSIS — J4489 Other specified chronic obstructive pulmonary disease: Secondary | ICD-10-CM

## 2011-04-08 DIAGNOSIS — R490 Dysphonia: Secondary | ICD-10-CM

## 2011-04-08 MED ORDER — LANSOPRAZOLE 30 MG PO CPDR: DELAYED_RELEASE_CAPSULE | ORAL | Status: DC

## 2011-04-08 NOTE — Assessment & Plan Note (Addendum)
Until we know severity of copd and whether microscopic changes on ct  will evolve these nodules remain a moot issue.   Discussed in detail all the  indications, usual  risks and alternatives  relative to the benefits with patient who agrees to proceed with conservative serial cxr f/u and see if detectable changes are developing.  Placed in tickle file for recall

## 2011-04-08 NOTE — Patient Instructions (Signed)
Try taking prevacid 30 mg x 42mbefore bfast daily  Stop spiriva for now (it's like high octane fuel for breathing - if you can do more activity with spiriva daily  and less need for albuterol rescue spray then restart spiriva- if not, leave it off  Please schedule a follow up office visit in 4 weeks, sooner if needed with cxr and pft's

## 2011-04-08 NOTE — Assessment & Plan Note (Addendum)
Better off advair and has prominent upper airway features making it difficult to know how much copd he actually has  DDX of  difficult airways managment all start with A and  include Adherence, Ace Inhibitors, Acid Reflux, Active Sinus Disease, Alpha 1 Antitripsin deficiency, Anxiety masquerading as Airways dz,  ABPA,  allergy(esp in young), Aspiration (esp in elderly), Adverse effects of DPI,  Active smokers, plus two Bs  = Bronchiectasis and Beta blocker use..and one C= CHF  Adherence is always the initial "prime suspect" and is a multilayered concern that requires a "trust but verify" approach in every patient - starting with knowing how to use medications, especially inhalers, correctly, keeping up with refills and understanding the fundamental difference between maintenance and prns vs those medications only taken for a very short course and then stopped and not refilled. The proper method of use, as well as anticipated side effects, of this metered-dose inhaler are discussed and demonstrated to the patient. Improved to 75% with coaching and ok to just use saba prn  ? Acid reflux > max rx/ diet reviewed   ? Adverse effects of dpi > try off spiriva next  See instructions for specific recommendations which were reviewed directly with the patient who was given a copy with highlighter outlining the key components.

## 2011-04-09 ENCOUNTER — Ambulatory Visit (HOSPITAL_COMMUNITY)
Admission: RE | Admit: 2011-04-09 | Discharge: 2011-04-09 | Disposition: A | Discharge status: Home / Self Care | Payer: Medicare Other | Source: Ambulatory Visit | Attending: Adult Health | Admitting: Adult Health

## 2011-04-09 ENCOUNTER — Ambulatory Visit (HOSPITAL_COMMUNITY): Admission: RE | Admit: 2011-04-09 | Payer: Medicare Other | Source: Ambulatory Visit

## 2011-04-09 DIAGNOSIS — R1310 Dysphagia, unspecified: Secondary | ICD-10-CM | POA: Insufficient documentation

## 2011-04-13 ENCOUNTER — Telehealth: Payer: Self-pay | Admitting: Adult Health

## 2011-04-13 DIAGNOSIS — R490 Dysphonia: Secondary | ICD-10-CM

## 2011-04-13 DIAGNOSIS — R1310 Dysphagia, unspecified: Secondary | ICD-10-CM | POA: Insufficient documentation

## 2011-04-13 MED ORDER — LANSOPRAZOLE 30 MG PO CPDR: DELAYED_RELEASE_CAPSULE | ORAL | Status: DC

## 2011-04-13 NOTE — Telephone Encounter (Signed)
MBS per ST said no obvious aspiration but pt at high risk According to ST note she went over results w/ him and gave him list of  Aspiration precautions w/ whole meds in applesause, sit upright,  No straws, small bites/sips, swalow 2-3 times after each bite.  If he has more questions please let me know   Also recommend OP referral to SLP (speech) for dysphagia for strengthing stratigies-can send referral  And he is to follow up with ENT .   Please contact office for sooner follow up if symptoms do not improve or worsen or seek emergency care

## 2011-04-13 NOTE — Telephone Encounter (Signed)
Talked with pt advised of results as below He will go for referral if insurance covers  Please send referral

## 2011-04-13 NOTE — Telephone Encounter (Signed)
TP had pt go for MBS and pt is calling for results. Pls advise.

## 2011-04-13 NOTE — Telephone Encounter (Signed)
I have told him his results just need to send order for referall to SLP for dysphagia training.

## 2011-04-13 NOTE — Telephone Encounter (Signed)
Order was sent to [facility] for speech therapy.

## 2011-04-21 ENCOUNTER — Other Ambulatory Visit: Payer: Self-pay | Admitting: Emergency Medicine

## 2011-04-29 ENCOUNTER — Ambulatory Visit: Payer: Medicare Other | Attending: Adult Health

## 2011-04-29 DIAGNOSIS — R1313 Dysphagia, pharyngeal phase: Secondary | ICD-10-CM | POA: Insufficient documentation

## 2011-04-29 DIAGNOSIS — IMO0001 Reserved for inherently not codable concepts without codable children: Secondary | ICD-10-CM | POA: Insufficient documentation

## 2011-05-05 ENCOUNTER — Ambulatory Visit: Payer: Medicare Other

## 2011-05-07 ENCOUNTER — Ambulatory Visit: Payer: Medicare Other

## 2011-05-10 ENCOUNTER — Ambulatory Visit: Payer: Medicare Other

## 2011-05-10 ENCOUNTER — Telehealth: Payer: Self-pay | Admitting: *Deleted

## 2011-05-10 NOTE — Telephone Encounter (Signed)
Per last OV note pt was to have a pft with rov but it was not scheduled. I have [id number] to r/s pt appt.[place] Bing, CMA

## 2011-05-10 NOTE — Telephone Encounter (Signed)
Pt called back and Juanita took the call and states will reschedule ov with PFT

## 2011-05-11 ENCOUNTER — Ambulatory Visit: Payer: Medicare Other

## 2011-05-11 ENCOUNTER — Ambulatory Visit: Payer: Medicare Other | Admitting: Internal Medicine

## 2011-05-17 ENCOUNTER — Ambulatory Visit: Payer: Medicare Other

## 2011-05-19 ENCOUNTER — Ambulatory Visit: Payer: Medicare Other

## 2011-05-19 ENCOUNTER — Telehealth: Payer: Self-pay | Admitting: *Deleted

## 2011-05-19 NOTE — Telephone Encounter (Signed)
Message copied by Rosana Berger on Wed May 19, 2011 12:13 PM ------      Message from: Rosana Berger      Created: Mon Apr 12, 2011  5:41 PM       Needs ov with cxr due by 07/07/11

## 2011-05-19 NOTE — Telephone Encounter (Signed)
Pt already sched for rov with cxr and pft's 06/08/11.

## 2011-05-20 ENCOUNTER — Ambulatory Visit: Payer: Medicare Other

## 2011-05-24 ENCOUNTER — Ambulatory Visit: Payer: Medicare Other | Attending: Adult Health

## 2011-05-24 ENCOUNTER — Telehealth: Payer: Self-pay | Admitting: Internal Medicine

## 2011-05-24 DIAGNOSIS — R1313 Dysphagia, pharyngeal phase: Secondary | ICD-10-CM | POA: Insufficient documentation

## 2011-05-24 DIAGNOSIS — IMO0001 Reserved for inherently not codable concepts without codable children: Secondary | ICD-10-CM | POA: Insufficient documentation

## 2011-05-24 MED ORDER — MONTELUKAST SODIUM 10 MG PO TABS: 10.0000 mg | ORAL_TABLET | Freq: Every day | ORAL | Status: DC

## 2011-05-24 NOTE — Telephone Encounter (Signed)
LM on named VM advising new rx was sent and to call back if they had any questions

## 2011-05-24 NOTE — Telephone Encounter (Signed)
That is fine send under my name , with 5 refills  He is under Dr. Melvyn Novas  I believe and was seen recently by him according to [facility]

## 2011-05-24 NOTE — Telephone Encounter (Signed)
TP pls clarify---the pt thinks he has switched doctors from Dr. Lamonte Sakai to Dr. Melvyn Novas and said that you arranged this for him. He is asking that a new rx be sent for montelukast under your name or Dr. Gustavus Bryant name instead of Dr. Lamonte Sakai. Pls advise.

## 2011-05-25 ENCOUNTER — Other Ambulatory Visit: Payer: Self-pay | Admitting: Emergency Medicine

## 2011-05-26 ENCOUNTER — Ambulatory Visit: Payer: Medicare Other

## 2011-05-28 ENCOUNTER — Ambulatory Visit: Payer: Medicare Other

## 2011-06-01 ENCOUNTER — Encounter: Payer: Self-pay | Admitting: Adult Health

## 2011-06-07 ENCOUNTER — Ambulatory Visit: Payer: Medicare Other

## 2011-06-08 ENCOUNTER — Ambulatory Visit (INDEPENDENT_AMBULATORY_CARE_PROVIDER_SITE_OTHER): Payer: Medicare Other | Admitting: Internal Medicine

## 2011-06-08 ENCOUNTER — Ambulatory Visit (INDEPENDENT_AMBULATORY_CARE_PROVIDER_SITE_OTHER)
Admission: RE | Admit: 2011-06-08 | Discharge: 2011-06-08 | Disposition: A | Discharge status: Home / Self Care | Payer: Medicare Other | Source: Ambulatory Visit | Attending: Internal Medicine | Admitting: Internal Medicine

## 2011-06-08 ENCOUNTER — Encounter: Payer: Self-pay | Admitting: Internal Medicine

## 2011-06-08 VITALS — BP 130/72 | HR 78 | Temp 98.3°F | Ht 67.0 in | Wt 141.0 lb

## 2011-06-08 DIAGNOSIS — J449 Chronic obstructive pulmonary disease, unspecified: Secondary | ICD-10-CM

## 2011-06-08 DIAGNOSIS — J984 Other disorders of lung: Secondary | ICD-10-CM

## 2011-06-08 LAB — PULMONARY FUNCTION TEST

## 2011-06-08 MED ORDER — BUDESONIDE-FORMOTEROL FUMARATE 160-4.5 MCG/ACT IN AERO: INHALATION_SPRAY | RESPIRATORY_TRACT | Status: DC

## 2011-06-08 NOTE — Progress Notes (Signed)
Subjective:    Patient ID: Bruce Travis, male    DOB: Aug 04, 1935, 76 y.o.   MRN: [id number]  HPI  59 yowm with a history of COPD, remote laryngeal cancer with some dysphagia, allergic rhinitis, and esophageal reflux. He has also been followed for chronic nodular granulomatous disease and bronchiectasis on CT scan chest, stable on CT for 2 + yrs (2009) .  ROV 04/03/10 -- returns for f/u COPD, allergic rhinitis, GERD, hx laryngeal CA. Recent treatment for acute sinusitis with Augmentin, also added atrovent NS to his other allergy regimen. He feels better, HA improved, mucous is now clear. Breathing has been good. Recently started on doxycycline to treat a dermatitis.   ROV 07/22/10 -- COPD, sinus disease, allergies. Old granulomatous disease. Taking clarinex, nasonex, mucinex DM. Quit atrovent nose spray. Taking Advair two times a day, rare ProAir use. No exacerbations since last visit. Doesn't rinse mouth out after the Advair, has thrush.   08/19/10-Presents for post hospital visit. Admitted 08/04/2010- 08/07/2010 for COPD exacerbation and PNA -bilateral lower lobe PNA. Tx w/ Rocephin and Zithromax. along w/ steroid taper and nebs. Discharged on [place]. Returns today w/ persistent cough with thick green mucus and dyspnea.   ROV 09/08/10 -- follow up for COPD, chronic rhinitis, allergies, chronic sinusitis, old granulomatous disease.   ROV 10/26/10 -- COPD, cough/rhinitis, sinusitis. Started atrovent nasal spray three times a day, it helped with nasal drainage. We also stopped Advair to see if it would help cough, throat. He wants to go back better.   7/20 /2012 Acute OV  Pt presents for an acute work in visit. Complains of increased cough with clear to yellow mucus.   Says he feels "gurgling" in his chest when he lays down at night. Says cough never goes completely away. Aggravating him all the times. OTC cough products do work.  More wheezing last few days. Last CT chest 2010 showed stable granulomatous  dz. Stable nodules, he is due for follow up CT scan to follow nodules. No weight loss, no hemoptysis. >>Doxycycline x 7 days , rx spiriva, and advair stopped CT chest   04/02/2011 Follow up NP Pt returns for follow up . Last visit with COPD flare tx w/ doxycyline x 7 days . He says he is feeling better but cough has not went away.  Still having chest congestion with beige mucus, hoarseness. We stopped Advair and started Spiriva.  He has noticed some  difficulty urinating and having to strain-says it is mild and not to bothersome. No dysuria or discharge. No back pain.  Most aggravating symptom is persistent cough, esp at night. No fever or chest pain. He does have a hx of throat cancer in past >20 years ago. Followed by ENT Dr. [name] in past. Says he has intermittent "aspiration " with certain foods, He has changed his diet which has helped. He has had recurrent PNA in past .   Last visit. CT chest was done for lung nodule surveillance, which showed Severe centrilobular emphysema with areas of new and persistent  nodularity given the multiplicity of findings, these could represent areas of progressive scarring. One more underlying primary lung neoplasm(s) versus an unusual appearance of pulmonary metastasis cannot be excluded. If the the patient is a potential surgery or treatment candidate, PET should be considered for further evaluation. If this is not performed, short-term follow-up chest CT at 3 months would be recommended. 2. Lower lobe findings which may represent infection or aspiration. He has an ov in  1 week to discuss with Dr. Melvyn Novas  .  rec We are setting you up for a Modified Barium Swallow to evaluate swallowing.  Would like for you to see Dr. [name] for your hoarseness/cough  Prednisone taper over next week.  Add Pepcid 44m At bedtime   Mucinex DM Twice daily  As needed  Cough Begin Spiriva Handihaler 1 puff daily , rinse after use.  Hydromet 1-2 tsp every 4-6 hr As needed   Cough  04/08/2011 ov/[name] cc cough much better, sob only with heavy ex, rare need for albuterol. Finishing up prednisone.  No longer on fish oil.  W/u by NLucia Gaskinsand ST in progress. rec Try taking prevacid 30 mg x 313mefore bfast daily  Stop spiriva for now (it's like high octane fuel for breathing - if you can do more activity with spiriva daily  and less need for albuterol rescue spray then restart spiriva- if not, leave it off  Please schedule a follow up office visit in 4 weeks, sooner if needed with cxr and pft's  06/08/2011 f/u ov/[name] cough gone but cc doe worse in cold damp weather, better p albuterol and no change off spiriva / advair. No sign cough/ excess mucus day or night. . Pt denies any significant sore throat, dysphagia, itching, sneezing,  nasal congestion or excess/ purulent secretions,  fever, chills, sweats, unintended wt loss, pleuritic or exertional cp, hempoptysis, orthopnea pnd or leg swelling.    Also denies any obvious fluctuation of symptoms with weather or environmental changes or other aggravating or alleviating factors.    Sleeping ok without nocturnal  or early am exac of resp c/o's or need for noct saba.             Objective:   Physical Exam  Hoarse amb wm nad Wt  140 04/08/2011 > 06/08/2011 141  HEENT mild turbinate edema.  Oropharynx no thrush or excess pnd or cobblestoning.  No JVD or cervical adenopathy. Mild accessory muscle hypertrophy. Trachea midline, nl thryroid. Chest was hyperinflated by percussion with diminished breath sounds and moderate increased exp time without wheeze. Hoover sign positive at mid inspiration. Regular rate and rhythm without murmur gallop or rub or increase P2 or edema.  Abd: no hsm, nl excursion. Ext warm without cyanosis or clubbing.       CXR  06/08/2011 : COPD and calcified granulomata. Stable compared to prior exam.     Assessment & Plan:

## 2011-06-08 NOTE — Progress Notes (Signed)
PFT done today. 

## 2011-06-08 NOTE — Patient Instructions (Signed)
Try symbicort 160 Take 2 puffs first thing in am and then another 2 puffs about 12 hours later - you can still use proaire as needed   We will call you with your cxr report   Please schedule a follow up visit in 3 months but call sooner if needed to see Tammy NP

## 2011-06-09 ENCOUNTER — Ambulatory Visit: Payer: Medicare Other

## 2011-06-09 NOTE — Assessment & Plan Note (Addendum)
Symptoms have improved off advair and spiriva with GOLD I COPD/ mostly emphysematous features and prominent upper airway symptoms which are better but still feels needs freq proaire so try bid symbicort to see if this addresses his symptoms better than the DPI's.  The proper method of use, as well as anticipated side effects, of this metered-dose inhaler are discussed and demonstrated to the patient. Improved to 75% with coaching

## 2011-06-09 NOTE — Assessment & Plan Note (Signed)
No def macroscopic changes on cxr and changes on ct on multicentric so no really opportunity for early intervention here but will need continued f/u (placed in tickle file for 3 months)  .

## 2011-06-09 NOTE — Progress Notes (Signed)
Quick Note:  Spoke with pt and notified of results per Dr. [name]. Pt verbalized understanding and denied any questions.  ______ 

## 2011-06-14 ENCOUNTER — Ambulatory Visit: Payer: Medicare Other

## 2011-06-16 ENCOUNTER — Ambulatory Visit: Payer: Medicare Other

## 2011-06-16 ENCOUNTER — Encounter: Payer: Self-pay | Admitting: Internal Medicine

## 2011-06-16 ENCOUNTER — Telehealth: Payer: Self-pay | Admitting: Adult Health

## 2011-06-16 ENCOUNTER — Other Ambulatory Visit (HOSPITAL_COMMUNITY): Payer: Self-pay | Admitting: Internal Medicine

## 2011-06-16 DIAGNOSIS — R490 Dysphonia: Secondary | ICD-10-CM

## 2011-06-16 NOTE — Telephone Encounter (Signed)
Called number provided above - [email or web address].  I see order was placed for this back in Aug.

## 2011-06-17 NOTE — Telephone Encounter (Signed)
Spoke with Marlowe Kays and new order placed for SLP MBS. I advised her to call back if any other problems.

## 2011-06-17 NOTE — Telephone Encounter (Signed)
Bruce Travis is returning the call from Triage & can be reached at [phone].  Again, stated they needed the order changed so they can use this order.  Satira Anis

## 2011-06-18 ENCOUNTER — Ambulatory Visit: Payer: Medicare Other

## 2011-06-22 ENCOUNTER — Ambulatory Visit (HOSPITAL_COMMUNITY)
Admission: RE | Admit: 2011-06-22 | Discharge: 2011-06-22 | Disposition: A | Discharge status: Home / Self Care | Payer: Medicare Other | Source: Ambulatory Visit | Attending: Internal Medicine | Admitting: Internal Medicine

## 2011-06-22 DIAGNOSIS — R1310 Dysphagia, unspecified: Secondary | ICD-10-CM | POA: Insufficient documentation

## 2011-06-22 DIAGNOSIS — R490 Dysphonia: Secondary | ICD-10-CM

## 2011-06-22 DIAGNOSIS — C801 Malignant (primary) neoplasm, unspecified: Secondary | ICD-10-CM | POA: Insufficient documentation

## 2011-06-23 ENCOUNTER — Other Ambulatory Visit: Payer: Self-pay | Admitting: Internal Medicine

## 2011-06-23 MED ORDER — DM-GUAIFENESIN ER 30-600 MG PO TB12: 1.0000 | ORAL_TABLET | Freq: Two times a day (BID) | ORAL | Status: DC

## 2011-07-02 ENCOUNTER — Telehealth: Payer: Self-pay | Admitting: Internal Medicine

## 2011-07-02 MED ORDER — MOMETASONE FUROATE 50 MCG/ACT NA SUSP: 2.0000 | Freq: Every day | NASAL | Status: DC | PRN

## 2011-07-02 MED ORDER — ALBUTEROL SULFATE HFA 108 (90 BASE) MCG/ACT IN AERS: 2.0000 | INHALATION_SPRAY | RESPIRATORY_TRACT | Status: DC | PRN

## 2011-07-02 NOTE — Telephone Encounter (Signed)
I spoke with pt and he states he needed his proair sent and his nasonex sent to [facility]. Pt states he needs 2 inhalers to be sent for the proair. Pt states he keeps one at home and 1 in his pocket for if needed. Nothing further was needed and rx's have been sent

## 2011-07-08 ENCOUNTER — Telehealth: Payer: Self-pay | Admitting: Internal Medicine

## 2011-07-08 NOTE — Telephone Encounter (Signed)
Called and spoke with pt. Pt wanted to know if MW would be willing to fill out a handicap placard for pt.  Pt states he has been having more difficulty breathing when walking from the parking lot to the stores/buildings.  Pt aware MW gone for the day but will return tomorrow and is ok to wait until tomorrow to have message addressed.

## 2011-07-08 NOTE — Telephone Encounter (Signed)
Spoke with pt and notified of recs per MW. Pt verbalized understanding.

## 2011-07-08 NOTE — Telephone Encounter (Signed)
Chart review does not support use of Handicap parking but I certainly would be happy to see him and troubleshoot the problem and if can't fix it (which is the goal) then certainly could give him a handicap permit at the ov  (he only has the mildest form of copd per pfts)

## 2011-08-10 ENCOUNTER — Other Ambulatory Visit: Payer: Self-pay | Admitting: Allergy

## 2011-08-10 MED ORDER — LANSOPRAZOLE 30 MG PO CPDR: DELAYED_RELEASE_CAPSULE | ORAL | Status: DC

## 2011-08-11 ENCOUNTER — Encounter (HOSPITAL_COMMUNITY): Payer: Self-pay | Admitting: Emergency Medicine

## 2011-08-11 ENCOUNTER — Other Ambulatory Visit: Payer: Self-pay

## 2011-08-11 ENCOUNTER — Emergency Department (HOSPITAL_COMMUNITY): Payer: Medicare Other

## 2011-08-11 ENCOUNTER — Inpatient Hospital Stay (HOSPITAL_COMMUNITY)
Admission: EM | Admit: 2011-08-11 | Discharge: 2011-08-12 | DRG: 194 | Disposition: A | Discharge status: Home / Self Care | Payer: Medicare Other | Source: Ambulatory Visit | Attending: Internal Medicine | Admitting: Internal Medicine

## 2011-08-11 DIAGNOSIS — R0902 Hypoxemia: Secondary | ICD-10-CM | POA: Diagnosis present

## 2011-08-11 DIAGNOSIS — K900 Celiac disease: Secondary | ICD-10-CM | POA: Diagnosis present

## 2011-08-11 DIAGNOSIS — E785 Hyperlipidemia, unspecified: Secondary | ICD-10-CM | POA: Diagnosis present

## 2011-08-11 DIAGNOSIS — I10 Essential (primary) hypertension: Secondary | ICD-10-CM | POA: Diagnosis present

## 2011-08-11 DIAGNOSIS — J449 Chronic obstructive pulmonary disease, unspecified: Secondary | ICD-10-CM

## 2011-08-11 DIAGNOSIS — Z800 Family history of malignant neoplasm of digestive organs: Secondary | ICD-10-CM

## 2011-08-11 DIAGNOSIS — J09X2 Influenza due to identified novel influenza A virus with other respiratory manifestations: Principal | ICD-10-CM | POA: Diagnosis present

## 2011-08-11 DIAGNOSIS — J441 Chronic obstructive pulmonary disease with (acute) exacerbation: Secondary | ICD-10-CM | POA: Diagnosis present

## 2011-08-11 DIAGNOSIS — Z8521 Personal history of malignant neoplasm of larynx: Secondary | ICD-10-CM

## 2011-08-11 DIAGNOSIS — Z87891 Personal history of nicotine dependence: Secondary | ICD-10-CM

## 2011-08-11 DIAGNOSIS — J101 Influenza due to other identified influenza virus with other respiratory manifestations: Secondary | ICD-10-CM | POA: Diagnosis present

## 2011-08-11 DIAGNOSIS — Z79899 Other long term (current) drug therapy: Secondary | ICD-10-CM

## 2011-08-11 DIAGNOSIS — Z823 Family history of stroke: Secondary | ICD-10-CM

## 2011-08-11 DIAGNOSIS — Z8042 Family history of malignant neoplasm of prostate: Secondary | ICD-10-CM

## 2011-08-11 DIAGNOSIS — K5730 Diverticulosis of large intestine without perforation or abscess without bleeding: Secondary | ICD-10-CM | POA: Diagnosis present

## 2011-08-11 DIAGNOSIS — I2510 Atherosclerotic heart disease of native coronary artery without angina pectoris: Secondary | ICD-10-CM | POA: Diagnosis present

## 2011-08-11 DIAGNOSIS — IMO0002 Reserved for concepts with insufficient information to code with codable children: Secondary | ICD-10-CM

## 2011-08-11 DIAGNOSIS — K219 Gastro-esophageal reflux disease without esophagitis: Secondary | ICD-10-CM | POA: Diagnosis present

## 2011-08-11 DIAGNOSIS — Z8249 Family history of ischemic heart disease and other diseases of the circulatory system: Secondary | ICD-10-CM

## 2011-08-11 HISTORY — DX: Calculus of kidney: N20.0

## 2011-08-11 LAB — COMPREHENSIVE METABOLIC PANEL
ALT: 20 U/L (ref 0–53)
Albumin: 3.3 g/dL — ABNORMAL LOW (ref 3.5–5.2)
Alkaline Phosphatase: 68 U/L (ref 39–117)
BUN: 19 mg/dL (ref 6–23)
Chloride: 100 mEq/L (ref 96–112)
GFR calc Af Amer: >90 mL/min (ref >90)
Glucose, Bld: 106 mg/dL — ABNORMAL HIGH (ref 70–99)
Potassium: 3.5 mEq/L (ref 3.5–5.1)
Sodium: 133 mEq/L — ABNORMAL LOW (ref 135–145)
Total Bilirubin: 0.5 mg/dL (ref 0.3–1.2)
Total Protein: 6.1 g/dL (ref 6.0–8.3)

## 2011-08-11 LAB — CBC
HCT: 48.4 % (ref 39.0–52.0)
HCT: 47.1 % (ref 39.0–52.0)
Hemoglobin: 16.7 g/dL (ref 13.0–17.0)
MCH: 33.8 pg (ref 26.0–34.0)
MCHC: 34 g/dL (ref 30.0–36.0)
RDW: 13.5 % (ref 11.5–15.5)
WBC: 8.3 10*3/uL (ref 4.0–10.5)

## 2011-08-11 LAB — POCT I-STAT 3, ART BLOOD GAS (G3+)
Acid-base deficit: 4 mmol/L — ABNORMAL HIGH (ref 0.0–2.0)
O2 Saturation: 82 %
TCO2: 21 mmol/L (ref 0–100)
pO2, Arterial: 47 mmHg — ABNORMAL LOW (ref 80.0–100.0)

## 2011-08-11 LAB — TROPONIN I: Troponin I: <0.3 ng/mL (ref <0.30)

## 2011-08-11 LAB — INFLUENZA PANEL BY PCR (TYPE A & B): Influenza A By PCR: POSITIVE — AB

## 2011-08-11 LAB — CREATININE, SERUM: GFR calc non Af Amer: 79 mL/min — ABNORMAL LOW (ref >90)

## 2011-08-11 MED ORDER — METHYLPREDNISOLONE SODIUM SUCC 125 MG IJ SOLR
INTRAMUSCULAR | Status: AC
  Administered 2011-08-11: 10:00:00
  Filled 2011-08-11: qty 2

## 2011-08-11 MED ORDER — ALBUTEROL SULFATE (5 MG/ML) 0.5% IN NEBU
2.5000 mg | INHALATION_SOLUTION | Freq: Four times a day (QID) | RESPIRATORY_TRACT | Status: DC
  Administered 2011-08-11 – 2011-08-12 (×4): 2.5 mg via RESPIRATORY_TRACT
  Filled 2011-08-11 (×4): qty 0.5

## 2011-08-11 MED ORDER — ROSUVASTATIN CALCIUM 20 MG PO TABS
20.0000 mg | ORAL_TABLET | Freq: Every day | ORAL | Status: DC
  Administered 2011-08-11: 20 mg via ORAL
  Filled 2011-08-11 (×2): qty 1

## 2011-08-11 MED ORDER — PREDNISONE 20 MG PO TABS
60.0000 mg | ORAL_TABLET | ORAL | Status: AC
  Administered 2011-08-11: 60 mg via ORAL
  Filled 2011-08-11: qty 3

## 2011-08-11 MED ORDER — IPRATROPIUM BROMIDE 0.02 % IN SOLN
RESPIRATORY_TRACT | Status: AC
  Administered 2011-08-11: 10:00:00
  Filled 2011-08-11: qty 2.5

## 2011-08-11 MED ORDER — SODIUM CHLORIDE 0.9 % IV SOLN: INTRAVENOUS | Status: AC

## 2011-08-11 MED ORDER — ACETAMINOPHEN 325 MG PO TABS: 650.0000 mg | ORAL_TABLET | Freq: Four times a day (QID) | ORAL | Status: DC | PRN

## 2011-08-11 MED ORDER — FLUTICASONE PROPIONATE 50 MCG/ACT NA SUSP
1.0000 | Freq: Every day | NASAL | Status: DC
  Administered 2011-08-11 – 2011-08-12 (×2): 1 via NASAL
  Filled 2011-08-11: qty 16

## 2011-08-11 MED ORDER — ASPIRIN 81 MG PO CHEW
81.0000 mg | CHEWABLE_TABLET | Freq: Every day | ORAL | Status: DC
  Administered 2011-08-11 – 2011-08-12 (×2): 81 mg via ORAL
  Filled 2011-08-11 (×2): qty 1

## 2011-08-11 MED ORDER — ALBUTEROL SULFATE (5 MG/ML) 0.5% IN NEBU: 2.5000 mg | INHALATION_SOLUTION | RESPIRATORY_TRACT | Status: DC | PRN

## 2011-08-11 MED ORDER — IPRATROPIUM BROMIDE 0.02 % IN SOLN
0.5000 mg | Freq: Four times a day (QID) | RESPIRATORY_TRACT | Status: DC
  Administered 2011-08-11 – 2011-08-12 (×4): 0.5 mg via RESPIRATORY_TRACT
  Filled 2011-08-11 (×4): qty 2.5

## 2011-08-11 MED ORDER — ACETAMINOPHEN 650 MG RE SUPP: 650.0000 mg | Freq: Four times a day (QID) | RECTAL | Status: DC | PRN

## 2011-08-11 MED ORDER — PANTOPRAZOLE SODIUM 40 MG PO TBEC
40.0000 mg | DELAYED_RELEASE_TABLET | Freq: Every day | ORAL | Status: DC
  Administered 2011-08-11: 40 mg via ORAL
  Filled 2011-08-11: qty 1

## 2011-08-11 MED ORDER — PREDNISONE 20 MG PO TABS
40.0000 mg | ORAL_TABLET | Freq: Every day | ORAL | Status: DC
  Administered 2011-08-12: 40 mg via ORAL
  Filled 2011-08-11 (×2): qty 2

## 2011-08-11 MED ORDER — OSELTAMIVIR PHOSPHATE 75 MG PO CAPS
75.0000 mg | ORAL_CAPSULE | ORAL | Status: AC
  Administered 2011-08-11: 75 mg via ORAL
  Filled 2011-08-11: qty 1

## 2011-08-11 MED ORDER — ALBUTEROL SULFATE (5 MG/ML) 0.5% IN NEBU
INHALATION_SOLUTION | RESPIRATORY_TRACT | Status: AC
  Administered 2011-08-11: 10:00:00
  Filled 2011-08-11: qty 1

## 2011-08-11 MED ORDER — ENOXAPARIN SODIUM 40 MG/0.4ML ~~LOC~~ SOLN
40.0000 mg | SUBCUTANEOUS | Status: DC
  Administered 2011-08-11: 40 mg via SUBCUTANEOUS
  Filled 2011-08-11 (×2): qty 0.4

## 2011-08-11 MED ORDER — GUAIFENESIN-DM 100-10 MG/5ML PO SYRP: 5.0000 mL | ORAL_SOLUTION | ORAL | Status: DC | PRN

## 2011-08-11 NOTE — ED Notes (Signed)
Patient given warm blankets.

## 2011-08-11 NOTE — ED Notes (Signed)
RT, Bruce Travis, at pt bedside for ABG at this time.

## 2011-08-11 NOTE — ED Provider Notes (Addendum)
History     CSN: [id number] Arrival date & time: 08/11/2011  9:13 AM   First MD Initiated Contact with Patient 08/11/11 [phone]      Chief Complaint  Patient presents with  . Shortness of Breath    (Consider location/radiation/quality/duration/timing/severity/associated sxs/prior treatment) HPI  Bruce Travis is a 76 year old man past medical history significant for COPD, GERD, esophageal cancer status post removal of his epiglottis with residual chronic aspiration presents with weakness, cough and chills for 4 days.  Bruce Travis states that he began feeling ill on Sunday. He initially had severe chills without rigors and cough. He states he began coughing so hard that he would be completely out of breath. He feels like he has congestion in his chest and is bringing up clear sputum. He saw his primary care doctor on Monday who diagnosed him with pneumonia and started moxifloxacin. No chest x-ray was performed. He states he is taking this for the past 3 days but has not felt any better. This morning he will and was severely dyspneic on exertion. He returned to his primary care doctor who noticed his oxygen sats were in the 80s and he was sent to the ED from his PCPs office. In the ED Bruce Travis states he feels short of breath, lightheaded, is coughing and has some abdominal soreness with cough but not otherwise. Pertinent negatives include chest pain, hemoptysis, headache, vision change, numbness, or focal weakness.  Bruce Travis states that he had throat cancer surgery approximately 20 years ago. He states he has had removal of his epiglottis and has chronic aspiration. He underwent speech therapy to try to improve his swallowing ability, without improvement. He denies large volume aspiration immediately prior to his current presentation.  Past Medical History  Diagnosis Date  . Personal history of colonic polyps 04/26/2007    hyperplastic   . Diverticulosis of colon (without mention of hemorrhage)   .  Family hx of colon cancer   . COPD (chronic obstructive pulmonary disease)   . Esophageal reflux   . Hypertension   . Hyperlipemia   . CAD (coronary artery disease)   . Throat cancer   . Other diseases of lung, not elsewhere classified   . Unspecified asthma   . Throat cancer   . Nephrolithiasis   . Hyperlipidemia     Past Surgical History  Procedure Date  . Vasectomy   . Head and neck surgery   . 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 Hemorrhoid surgery   . Hand surgery     d/t crush injury    Family History  Problem Relation Age of Onset  . Heart disease Father   . Colon cancer Father   . Prostate cancer Father   . Stroke Mother     History  Substance Use Topics  . Smoking status: Former Smoker -- 2.0 packs/day for 35 years    Types: Cigarettes    Quit date: 01/21/1989  . Smokeless tobacco: Former Systems developer  . Alcohol Use: No      Review of Systems  Constitutional: Positive for chills, activity change and fatigue. Negative for fever and diaphoresis.  HENT: Positive for trouble swallowing. Negative for congestion, sore throat and dental problem.   Eyes: Negative for photophobia and visual disturbance.  Respiratory: Positive for cough, shortness of breath and wheezing. Negative for choking and chest tightness.   Cardiovascular: Negative for chest pain and leg swelling.  Gastrointestinal: Negative for nausea, vomiting, abdominal pain, diarrhea,  constipation, blood in stool and abdominal distention.  Genitourinary: Negative for dysuria, hematuria and flank pain.  Musculoskeletal: Negative for back pain and arthralgias.  Skin: Negative for color change and rash.  Neurological: Positive for weakness and light-headedness. Negative for dizziness, numbness and headaches.  Psychiatric/Behavioral: Negative for confusion.    Allergies  Codeine  Home Medications   Current Outpatient Rx  Name Route Sig Dispense Refill  . VITAMIN C 1000 MG PO TABS Oral Take 1,000 mg  by mouth 2 (two) times daily.      . ASPIRIN 81 MG PO TABS Oral Take 81 mg by mouth daily.      . ATORVASTATIN CALCIUM 40 MG PO TABS Oral Take 40 mg by mouth at bedtime.     . BUDESONIDE-FORMOTEROL FUMARATE 160-4.5 MCG/ACT IN AERO  Take 2 puffs first thing in am and then another 2 puffs about 12 hours later.    1 Inhaler 12  . COQ10 100 MG PO CAPS Oral Take 1 capsule by mouth 2 (two) times daily.      Marland Kitchen DIOVAN 80 MG PO TABS Oral Take 80 mg by mouth daily. 1 by mouth daily    . HYDROCODONE-HOMATROPINE 5-1.5 MG/5ML PO SYRP  1-2 tsp every 4-6hrs as needed for cough 240 mL 0  . LANSOPRAZOLE 30 MG PO CPDR Oral Take 15 mg by mouth 2 (two) times daily. Take 30-60 min before first meal of the day     . MOMETASONE FUROATE 50 MCG/ACT NA SUSP Nasal Place 2 sprays into the nose daily as needed. 17 g 2  . MONTELUKAST SODIUM 10 MG PO TABS Oral Take 1 tablet (10 mg total) by mouth daily. 30 tablet 5  . MOXIFLOXACIN HCL 400 MG PO TABS Oral Take 400 mg by mouth daily. For 7 days     . ALBUTEROL SULFATE HFA 108 (90 BASE) MCG/ACT IN AERS Inhalation Inhale 2 puffs into the lungs every 4 (four) hours as needed. 2 Inhaler 2    BP 127/68  Pulse 89  Temp(Src) 99.9 F (37.7 C) (Rectal)  Resp 21  Ht 5' 7"  (1.702 m)  Wt 138 lb (62.596 kg)  BMI 21.61 kg/m2  SpO2 92%  Physical Exam  Nursing note and vitals reviewed. Constitutional: He is oriented to person, place, and time. He appears well-developed. He appears ill. No distress. Face mask in place.  HENT:  Head: Normocephalic and atraumatic.  Mouth/Throat: Mucous membranes are not pale, dry and not cyanotic. He has dentures. Abnormal dentition. Oropharyngeal exudate present. No posterior oropharyngeal edema or posterior oropharyngeal erythema.  Eyes: Conjunctivae and EOM are normal. Pupils are equal, round, and reactive to light.  Cardiovascular: Intact distal pulses and normal pulses.  An irregular rhythm present. Frequent extrasystoles are present. Tachycardia  present.   No murmur heard. Pulses:      Radial pulses are 2+ on the right side, and 2+ on the left side.       Dorsalis pedis pulses are 2+ on the right side, and 2+ on the left side.       Posterior tibial pulses are 2+ on the right side, and 2+ on the left side.  Pulmonary/Chest: No accessory muscle usage. Tachypnea noted. No respiratory distress. He has no decreased breath sounds. He has wheezes in the right upper field. He has no rhonchi. He has no rales.  Abdominal: Soft. Normal appearance and bowel sounds are normal. He exhibits no shifting dullness, no distension and no fluid wave. There is no  tenderness. There is no rigidity, no rebound, no guarding and no CVA tenderness.  Musculoskeletal: He exhibits no edema.  Neurological: He is alert and oriented to person, place, and time. No cranial nerve deficit. Coordination normal.  Skin: Skin is warm and dry. No rash noted. He is not diaphoretic. No erythema.  Psychiatric: He has a normal mood and affect. His behavior is normal. Judgment and thought content normal.    ED Course  Procedures (including critical care time)  Labs Reviewed  CBC - Abnormal; Notable for the following:    MCH 34.4 (*)    Platelets 135 (*)    All other components within normal limits  POCT I-STAT 3, BLOOD GAS (G3+) - Abnormal; Notable for the following:    pCO2 arterial 32.3 (*)    pO2, Arterial 47.0 (*)    Bicarbonate 19.7 (*)    Acid-base deficit 4.0 (*)    All other components within normal limits  COMPREHENSIVE METABOLIC PANEL  TROPONIN I  CULTURE, BLOOD (ROUTINE X 2)  CULTURE, BLOOD (ROUTINE X 2)  INFLUENZA PANEL BY PCR  I-STAT 3, BLOOD GAS (G3+)   Dg Chest 2 View  08/11/2011  *RADIOLOGY REPORT*  Clinical Data: Shortness of breath, cough, congestion and hypoxia.  CHEST - 2 VIEW  Comparison: Plain films of the chest 06/08/2011 and CT chest 03/12/2011.  Findings: Lungs are markedly emphysematous.  Calcified granulomata are again seen.  No new  airspace disease or effusion.  Heart size is normal.  IMPRESSION: Severe emphysema.  Stable compared to prior exam.  Original Report Authenticated By: Arvid Right. Luther Parody, M.D.     No diagnosis found.    Date: 08/11/2011  Rate: 100  Rhythm: sinus tachycardia and premature ventricular contractions (PVC)  QRS Axis: left  Intervals: normal  ST/T Wave abnormalities: normal  Conduction Disutrbances:none  Narrative Interpretation: Sinus tachycardia with frequent PVCs.  Old EKG Reviewed: none available, changes noted and Incomplete right bundle branch block no longer seen. PVCs are new.     MDM  Likely PNA vs COPD exacerbation vs bronchitis. MI or CHF unlikely as cause of DOE given lack of chest pain, sympathetic symptoms, orthopnea, lower extremity edema, crackles on exam or EKG changes.  We'll obtain chest x-ray, ABGs blood cultures, influenza panel, CBC, CMP and troponin.  Will start fluids.  11:50 AM-- Given negative CXR and positive influenza A, this is likely Influenza with COPD exacerbation. We'll give prednisone 60 mg by mouth, moxifloxacin, continue nebulizer treatments and a medicine admit to the Medicine Teaching Service for COPD exacerbation given failure of outpatient management and hypoxia with pO2 of 47.  12:30 PM troponin negative for the decreasing suspicion of cardiac pathology.   Augustin Coupe, MD 08/11/11 1158  Augustin Coupe, MD 08/11/11 2951  Augustin Coupe, MD 08/11/11 [phone]

## 2011-08-11 NOTE — ED Notes (Signed)
Per [facility], pt from [facility].  Dx with pneumonia Monday, now has productive cough.  Shortness of breath today.  Initial sats per MD's office were 80%.  Pt has had total 10 mg albuterol, 5 mcg atrovent, and 125 mg solumedrol.

## 2011-08-11 NOTE — H&P (Signed)
Hospital Admission Note Date: 08/11/2011  Patient name: Bruce Travis Medical record number: [id number] Date of birth: 1935-03-14 Age: 76 y.o. Gender: male PCP: Odette Fraction, MD, MD  Medical Service:  Internal Medicine Teaching Service - [facility]  Attending physician:  Dr. Linus Salmons    1st Contact: Dr. Nicoletta Dress    Pager: [phone] 2nd Contact: Dr. Jerelene Redden   [phone]  After 5 pm or weekends: 1st Contact:  Pager: [phone] 2nd Contact:  Pager: [phone]  Chief Complaint: cough, sob, chills  History of Present Illness: Pt is a 76 y/o M with PMH significant for COPD (on 2L O2 by  at night), laryngeal cancer s/p surgical resection who presents with cough, fever, and sob that began 4 days PTA.  He reports his cough is productive of clear sputum; denies hemoptysis.  He was evaluated by his PCP on Monday (2 days PTA) and was prescribed Avelox; he reports no improvement of his symptoms despite 3 days of therapy.  He has developed worsening SOB and DOE over the past few days and admits to feeling very tired and fatigued.  He denies any chest pain or syncope.  He reports fevers and chills with a home temps of 101-102; tmax of 102 on Sunday evening.  He denies any nausea, vomiting, diarrhea, abdominal pain, headache, sore throat, visual changes, or other associated complaint.  He denies any sick contacts or recent travel.  He has one horse, two dogs, and three cats at home; reports all animals are healthy.  He denies any other animal exposure; no birds at home.  He admits to diagnosis of COPD but states he has never required treatment with steroids and have never been intubated.  He is UTD on annual flu and pneumonia vaccinations.  He has no other concerns or complaints.    Meds: Medications Prior to Admission  Medication Sig Dispense Refill  . Ascorbic Acid (VITAMIN C) 1000 MG tablet Take 1,000 mg by mouth 2 (two) times daily.        Marland Kitchen aspirin 81 MG tablet Take 81 mg by mouth daily.        Marland Kitchen atorvastatin  (LIPITOR) 40 MG tablet Take 40 mg by mouth at bedtime.       . budesonide-formoterol (SYMBICORT) 160-4.5 MCG/ACT inhaler Take 2 puffs first thing in am and then another 2 puffs about 12 hours later.     1 Inhaler  12  . Coenzyme Q10 (COQ10) 100 MG CAPS Take 1 capsule by mouth 2 (two) times daily.        Marland Kitchen DIOVAN 80 MG tablet Take 80 mg by mouth daily. 1 by mouth daily      . HYDROcodone-homatropine (HYCODAN) 5-1.5 MG/5ML syrup 1-2 tsp every 4-6hrs as needed for cough  240 mL  0  . lansoprazole (PREVACID) 30 MG capsule Take 15 mg by mouth 2 (two) times daily. Take 30-60 min before first meal of the day       . mometasone (NASONEX) 50 MCG/ACT nasal spray Place 2 sprays into the nose daily as needed.  17 g  2  . montelukast (SINGULAIR) 10 MG tablet Take 1 tablet (10 mg total) by mouth daily.  30 tablet  5  . moxifloxacin (AVELOX) 400 MG tablet Take 400 mg by mouth daily. For 7 days       . albuterol (PROAIR HFA) 108 (90 BASE) MCG/ACT inhaler Inhale 2 puffs into the lungs every 4 (four) hours as needed.  2 Inhaler  2  Allergies: Codeine: headache Past Medical History  Diagnosis Date  . Personal history of colonic polyps 04/26/2007    hyperplastic   . Diverticulosis of colon (without mention of hemorrhage)   . Family hx of colon cancer   . COPD (chronic obstructive pulmonary disease)   . Esophageal reflux   . Hypertension   . Hyperlipemia   . CAD (coronary artery disease)   . Nephrolithiasis   . Hyperlipidemia   Celiac disease - diagnosed by "blood test" 6-8 months PTA  Past Surgical History  Procedure Date  . Vasectomy   . Head and neck surgery   . 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 Hemorrhoid surgery   . Hand surgery     d/t crush injury   Family hx:  Father: deceased, age 2.  Colon ca, prostate ca Mother: deceased, age 51.  CVA. Sister: deceased, unknown GI cancer PGM: stomach ca  Social hx: pt lives in [place] with his wife.  He currently works; drives a  Geophysicist/field seismologist.  Former smoker; smoked 2-3ppd x 41yr, quit 22 yrs ago.  Denies any EtOH or illicit drug use.  Review of Systems: Pertinent items are noted in HPI.  Physical Exam: Blood pressure 122/58, pulse 95, temperature 99.9 F (37.7 C), temperature source Rectal, resp. rate 21, height 5' 7"  (1.702 m), weight 138 lb (62.596 kg), SpO2 95.00%. VItal signs reviewed and stable. GEN: No apparent distress.  Alert and oriented x 3.  Pleasant, conversant, and cooperative to exam. HEENT: head is autraumatic and normocephalic.  Neck is supple without palpable masses or lymphadenopathy.  No JVD or carotid bruits.  Vision intact.  EOMI.  PERRLA.  Sclerae anicteric.  Conjunctivae without pallor or injection. Mucous membranes are moist.  Oropharynx is without erythema, exudates, or other abnormal lesions.   RESP:  Significantly diminished breath sounds throughout bilateral lung fields; poor air movement.  Slightly prolonged expiratory phase noted.  No ronchi, wheezes, crackles, or rubs. CARDIOVASCULAR: Very distant S1, S2.  Irregular rhythm, normal rate.  No M/G/R. ABDOMEN: soft, non-tender, non-distended.  Bowels sounds present in all quadrants and normoactive.  No palpable masses. EXT: warm and dry.  Peripheral pulses equal, intact, and +2 globally.  No clubbing or cyanosis.  No edema in bilateral lower extremities. SKIN: warm and dry with normal turgor.  No rashes or abnormal lesions observed. NEURO: No focal deficit.   Lab results: Basic Metabolic Panel:  Basename 08/11/11 1000  NA 133*  K 3.5  CL 100  CO2 20  GLUCOSE 106*  BUN 19  CREATININE 0.97  CALCIUM 8.8  MG --  PHOS --  AG: 13  Liver Function Tests:  Basename 08/11/11 1000  AST 20  ALT 20  ALKPHOS 68  BILITOT 0.5  PROT 6.1  ALBUMIN 3.3*   CBC:  Basename 08/11/11 1000  WBC 8.3  NEUTROABS --  HGB 16.7  HCT 48.4  MCV 99.6  PLT 135*   Cardiac Enzymes:  Basename 08/11/11 1000  CKTOTAL --  CKMB --  CKMBINDEX  --  TROPONINI <0.30   Arterial Blood Gas result (obtained on RA) : pH: 7.39/ pCO2: 32/ pO2: 47/HCO3: 19.2    Imaging results:  Dg Chest 2 View  08/11/2011  *RADIOLOGY REPORT*  Clinical Data: Shortness of breath, cough, congestion and hypoxia.  CHEST - 2 VIEW  Comparison: Plain films of the chest 06/08/2011 and CT chest 03/12/2011.  Findings: Lungs are markedly emphysematous.  Calcified granulomata are again seen.  No new airspace disease or effusion.  Heart size is normal.  IMPRESSION: Severe emphysema.  Stable compared to prior exam.  Original Report Authenticated By: Arvid Right. Luther Parody, M.D.    Other results: EKG: sinus tachycardia with PVCs, probable atrial enlargement (right), normal PR interval, prolonged QTc (490) but QT interval appears wnl, normal R wave progression, no ST abnormalities  AnAssessment & Plan by Problem: Acute COPD exacerbation: Mr Conover reports increased cough with sputum production as well as progressive SOB and wheezing consistent with an acute COPD exacerbation likely precipitated by infection with Influenza A.  His shortness of breath and wheezing improved after administration of prednisone and albuterol/atrovent nebs in the ER.  His ABG reveals hypoxia with no significant acid base disturbances (perhaps slight respiratory alkalosis related to hypoxia).  It is unlikely that his SOB is the result of ACS as Tn-I negative and no evidence of ischemia (or other concerning cardiac process) on EKG. - admit to telemetry - prednisone 26m daily, plan to taper - Albuterol nebs Q6h and Q2prn - Atrovent nebs Q6h  - Robitussin cough syrup prn - repeat ABG now and in am   Influenza A: patient's fever, cough, and malaise is likely the result of Influenza A infection; Influenza PCR obtained in the ER was positive.  CXR did not reveal any focal consolidation/infiltrate or evidence of acute cardiopulmonary process.   Atypical PNA is also possible as this may present with no  abnormalities on CXR, however would expect symptoms would have improved with Avelox if his symptoms were the result of atypical or community acquired PNA.   - tamiflu 75 mg po bid x 5 days - droplet isolation - check blood cultures - send sputum for GS and cx - consider initiation of empiric abx for CAP if pt decompensates  Hypoxia: this is the result of problems #1 and #2.  Will manage as outlined above.  HTN: pt normotensive with occasional soft BPs (systolics in the 1225.  Will hold Diovan and continue to monitor.  Celiac disease: pt reports this is controlled with gluten free diet.  Will order gluten free diet.  GERD: protonix  DVT prophylaxis: lovenox   Signed: Maurita Havener 08/11/2011, 4:49 PM

## 2011-08-11 NOTE — ED Notes (Signed)
Gluten free diet ordered for pt.

## 2011-08-11 NOTE — ED Notes (Signed)
Pt oxygen sat dropped to mid 80's while RT in room collecting ABG samples.  Oxygen placed on pt at 4L/ min via [place] after collection of samples.

## 2011-08-11 NOTE — ED Notes (Signed)
Dr. Venora Maples at pt bedside.

## 2011-08-11 NOTE — ED Provider Notes (Signed)
I saw and evaluated the patient, reviewed the resident's note and I agree with the findings and plan.   Date: 08/11/2011  Rate: 100  Rhythm: normal sinus rhythm  QRS Axis: normal  Intervals: normal  ST/T Wave abnormalities: nonspecific ST changes  Conduction Disutrbances:PVCs  Narrative Interpretation: PVCs  Old EKG Reviewed: No significant changes noted  Severe COPD exacerbation.  Steroids given in the emergency department.  Patient received treatments in route as well as in the emergency department.  Given his profound hypoxia of 80% on arrival the patient will be admitted overnight for observation.  No evidence of infiltrate on his chest x-ray.  Positive influenza will be started on Tamiflu  Results for orders placed during the hospital encounter of 08/11/11  CBC      Component Value Range   WBC 8.3  4.0 - 10.5 (K/uL)   RBC 4.86  4.22 - 5.81 (MIL/uL)   Hemoglobin 16.7  13.0 - 17.0 (g/dL)   HCT 48.4  39.0 - 52.0 (%)   MCV 99.6  78.0 - 100.0 (fL)   MCH 34.4 (*) 26.0 - 34.0 (pg)   MCHC 34.5  30.0 - 36.0 (g/dL)   RDW 13.7  11.5 - 15.5 (%)   Platelets 135 (*) 150 - 400 (K/uL)  COMPREHENSIVE METABOLIC PANEL      Component Value Range   Sodium 133 (*) 135 - 145 (mEq/L)   Potassium 3.5  3.5 - 5.1 (mEq/L)   Chloride 100  96 - 112 (mEq/L)   CO2 20  19 - 32 (mEq/L)   Glucose, Bld 106 (*) 70 - 99 (mg/dL)   BUN 19  6 - 23 (mg/dL)   Creatinine, Ser 0.97  0.50 - 1.35 (mg/dL)   Calcium 8.8  8.4 - 10.5 (mg/dL)   Total Protein 6.1  6.0 - 8.3 (g/dL)   Albumin 3.3 (*) 3.5 - 5.2 (g/dL)   AST 20  0 - 37 (U/L)   ALT 20  0 - 53 (U/L)   Alkaline Phosphatase 68  39 - 117 (U/L)   Total Bilirubin 0.5  0.3 - 1.2 (mg/dL)   GFR calc non Af Amer 79 (*) >90 (mL/min)   GFR calc Af Amer >90  >90 (mL/min)  TROPONIN I      Component Value Range   Troponin I <0.30  <0.30 (ng/mL)  INFLUENZA PANEL BY PCR      Component Value Range   Influenza A By PCR POSITIVE (*) NEGATIVE    Influenza B By PCR NEGATIVE   NEGATIVE    H1N1 flu by pcr NOT DETECTED  NOT DETECTED   POCT I-STAT 3, BLOOD GAS (G3+)      Component Value Range   pH, Arterial 7.392  7.350 - 7.450    pCO2 arterial 32.3 (*) 35.0 - 45.0 (mmHg)   pO2, Arterial 47.0 (*) 80.0 - 100.0 (mmHg)   Bicarbonate 19.7 (*) 20.0 - 24.0 (mEq/L)   TCO2 21  0 - 100 (mmol/L)   O2 Saturation 82.0     Acid-base deficit 4.0 (*) 0.0 - 2.0 (mmol/L)   Collection site RADIAL, ALLEN'S TEST ACCEPTABLE     Drawn by Operator     Sample type ARTERIAL     Dg Chest 2 View  08/11/2011  *RADIOLOGY REPORT*  Clinical Data: Shortness of breath, cough, congestion and hypoxia.  CHEST - 2 VIEW  Comparison: Plain films of the chest 06/08/2011 and CT chest 03/12/2011.  Findings: Lungs are markedly emphysematous.  Calcified granulomata are  again seen.  No new airspace disease or effusion.  Heart size is normal.  IMPRESSION: Severe emphysema.  Stable compared to prior exam.  Original Report Authenticated By: Arvid Right. Luther Parody, M.D.   I personally evaluated the chest x-ray      Hoy Morn, MD 08/11/11 1237

## 2011-08-12 DIAGNOSIS — R0902 Hypoxemia: Secondary | ICD-10-CM

## 2011-08-12 DIAGNOSIS — J441 Chronic obstructive pulmonary disease with (acute) exacerbation: Secondary | ICD-10-CM

## 2011-08-12 DIAGNOSIS — J111 Influenza due to unidentified influenza virus with other respiratory manifestations: Secondary | ICD-10-CM

## 2011-08-12 LAB — COMPREHENSIVE METABOLIC PANEL
ALT: 16 U/L (ref 0–53)
Alkaline Phosphatase: 58 U/L (ref 39–117)
BUN: 21 mg/dL (ref 6–23)
CO2: 24 mEq/L (ref 19–32)
Chloride: 103 mEq/L (ref 96–112)
GFR calc Af Amer: 83 mL/min — ABNORMAL LOW (ref >90)
GFR calc non Af Amer: 72 mL/min — ABNORMAL LOW (ref >90)
Glucose, Bld: 133 mg/dL — ABNORMAL HIGH (ref 70–99)
Potassium: 3.9 mEq/L (ref 3.5–5.1)
Sodium: 136 mEq/L (ref 135–145)
Total Bilirubin: 0.3 mg/dL (ref 0.3–1.2)

## 2011-08-12 LAB — BLOOD GAS, ARTERIAL
Acid-base deficit: 2.6 mmol/L — ABNORMAL HIGH (ref 0.0–2.0)
Acid-base deficit: 1.4 mmol/L (ref 0.0–2.0)
Bicarbonate: 21 mEq/L (ref 20.0–24.0)
Drawn by: 10006
O2 Content: 2 L/min
O2 Saturation: 91.7 %
Patient temperature: 98.6
TCO2: 22 mmol/L (ref 0–100)
TCO2: 23.2 mmol/L (ref 0–100)
pCO2 arterial: 32 mmHg — ABNORMAL LOW (ref 35.0–45.0)
pCO2 arterial: 33.3 mmHg — ABNORMAL LOW (ref 35.0–45.0)
pO2, Arterial: 66.8 mmHg — ABNORMAL LOW (ref 80.0–100.0)

## 2011-08-12 MED ORDER — ALBUTEROL SULFATE HFA 108 (90 BASE) MCG/ACT IN AERS
2.0000 | INHALATION_SPRAY | RESPIRATORY_TRACT | Status: DC | PRN
  Filled 2011-08-12: qty 6.7

## 2011-08-12 MED ORDER — PREDNISONE 10 MG PO TABS: ORAL_TABLET | ORAL | Status: DC

## 2011-08-12 MED ORDER — ACETAMINOPHEN 325 MG PO TABS: 650.0000 mg | ORAL_TABLET | Freq: Four times a day (QID) | ORAL | Status: AC | PRN

## 2011-08-12 MED ORDER — OSELTAMIVIR PHOSPHATE 75 MG PO CAPS: 75.0000 mg | ORAL_CAPSULE | Freq: Two times a day (BID) | ORAL | Status: AC

## 2011-08-12 MED ORDER — OSELTAMIVIR PHOSPHATE 75 MG PO CAPS
75.0000 mg | ORAL_CAPSULE | Freq: Two times a day (BID) | ORAL | Status: DC
  Administered 2011-08-12: 75 mg via ORAL
  Filled 2011-08-12 (×2): qty 1

## 2011-08-12 MED ORDER — BUDESONIDE-FORMOTEROL FUMARATE 160-4.5 MCG/ACT IN AERO
2.0000 | INHALATION_SPRAY | Freq: Two times a day (BID) | RESPIRATORY_TRACT | Status: DC
  Administered 2011-08-12: 2 via RESPIRATORY_TRACT
  Filled 2011-08-12: qty 6

## 2011-08-12 MED ORDER — MONTELUKAST SODIUM 10 MG PO TABS
10.0000 mg | ORAL_TABLET | Freq: Every day | ORAL | Status: DC
  Filled 2011-08-12: qty 1

## 2011-08-12 NOTE — H&P (Signed)
Internal Medicine Teaching Service Attending Note Date: 08/12/2011  Patient name: Bruce Travis  Medical record number: [id number]  Date of birth: 10-30-1934   I have seen and evaluated Bruce Travis and discussed their care with the Residency Team.  76 yo male with hx laryngeal CA with resection presented with COPD exacerbation.  Patient on home O2 at night at baseline.  Pt had been on Avelox from PCP but little improvement.  Has had fever to 102.  No sick contacts.  UTD on vaccinations.    Physical Exam: Blood pressure 114/66, pulse 78, temperature 98.2 F (36.8 C), temperature source Oral, resp. rate 18, height 5' 7"  (1.702 m), weight 138 lb (62.596 kg), SpO2 93.00%. BP 114/66  Pulse 78  Temp(Src) 98.2 F (36.8 C) (Oral)  Resp 18  Ht 5' 7"  (1.702 m)  Wt 138 lb (62.596 kg)  BMI 21.61 kg/m2  SpO2 93% General appearance: alert and no distress Lungs: wheezes bilaterally  Lab results: Results for orders placed during the hospital encounter of 08/11/11 (from the past 24 hour(s))  CBC     Status: Abnormal   Collection Time   08/11/11 10:00 AM      Component Value Range   WBC 8.3  4.0 - 10.5 (K/uL)   RBC 4.86  4.22 - 5.81 (MIL/uL)   Hemoglobin 16.7  13.0 - 17.0 (g/dL)   HCT 48.4  39.0 - 52.0 (%)   MCV 99.6  78.0 - 100.0 (fL)   MCH 34.4 (*) 26.0 - 34.0 (pg)   MCHC 34.5  30.0 - 36.0 (g/dL)   RDW 13.7  11.5 - 15.5 (%)   Platelets 135 (*) 150 - 400 (K/uL)  COMPREHENSIVE METABOLIC PANEL     Status: Abnormal   Collection Time   08/11/11 10:00 AM      Component Value Range   Sodium 133 (*) 135 - 145 (mEq/L)   Potassium 3.5  3.5 - 5.1 (mEq/L)   Chloride 100  96 - 112 (mEq/L)   CO2 20  19 - 32 (mEq/L)   Glucose, Bld 106 (*) 70 - 99 (mg/dL)   BUN 19  6 - 23 (mg/dL)   Creatinine, Ser 0.97  0.50 - 1.35 (mg/dL)   Calcium 8.8  8.4 - 10.5 (mg/dL)   Total Protein 6.1  6.0 - 8.3 (g/dL)   Albumin 3.3 (*) 3.5 - 5.2 (g/dL)   AST 20  0 - 37 (U/L)   ALT 20  0 - 53 (U/L)   Alkaline Phosphatase 68   39 - 117 (U/L)   Total Bilirubin 0.5  0.3 - 1.2 (mg/dL)   GFR calc non Af Amer 79 (*) >90 (mL/min)   GFR calc Af Amer >90  >90 (mL/min)  TROPONIN I     Status: Normal   Collection Time   08/11/11 10:00 AM      Component Value Range   Troponin I <0.30  <0.30 (ng/mL)  CULTURE, BLOOD (ROUTINE X 2)     Status: Normal (Preliminary result)   Collection Time   08/11/11 10:15 AM      Component Value Range   Specimen Description BLOOD RIGHT ARM     Special Requests BOTTLES DRAWN AEROBIC AND ANAEROBIC 10CC     Setup Time [id number]     Culture       Value:        BLOOD CULTURE RECEIVED NO GROWTH TO DATE CULTURE WILL BE HELD FOR 5 DAYS BEFORE ISSUING A FINAL NEGATIVE REPORT  Report Status PENDING    CULTURE, BLOOD (ROUTINE X 2)     Status: Normal (Preliminary result)   Collection Time   08/11/11 10:20 AM      Component Value Range   Specimen Description BLOOD LEFT ARM     Special Requests BOTTLES DRAWN AEROBIC AND ANAEROBIC 10CC     Setup Time [id number]     Culture       Value:        BLOOD CULTURE RECEIVED NO GROWTH TO DATE CULTURE WILL BE HELD FOR 5 DAYS BEFORE ISSUING A FINAL NEGATIVE REPORT   Report Status PENDING    INFLUENZA PANEL BY PCR     Status: Abnormal   Collection Time   08/11/11 10:28 AM      Component Value Range   Influenza A By PCR POSITIVE (*) NEGATIVE    Influenza B By PCR NEGATIVE  NEGATIVE    H1N1 flu by pcr NOT DETECTED  NOT DETECTED   POCT I-STAT 3, BLOOD GAS (G3+)     Status: Abnormal   Collection Time   08/11/11 10:46 AM      Component Value Range   pH, Arterial 7.392  7.350 - 7.450    pCO2 arterial 32.3 (*) 35.0 - 45.0 (mmHg)   pO2, Arterial 47.0 (*) 80.0 - 100.0 (mmHg)   Bicarbonate 19.7 (*) 20.0 - 24.0 (mEq/L)   TCO2 21  0 - 100 (mmol/L)   O2 Saturation 82.0     Acid-base deficit 4.0 (*) 0.0 - 2.0 (mmol/L)   Collection site RADIAL, ALLEN'S TEST ACCEPTABLE     Drawn by Operator     Sample type ARTERIAL    CBC     Status: Normal   Collection  Time   08/11/11  4:56 PM      Component Value Range   WBC 5.5  4.0 - 10.5 (K/uL)   RBC 4.73  4.22 - 5.81 (MIL/uL)   Hemoglobin 16.0  13.0 - 17.0 (g/dL)   HCT 47.1  39.0 - 52.0 (%)   MCV 99.6  78.0 - 100.0 (fL)   MCH 33.8  26.0 - 34.0 (pg)   MCHC 34.0  30.0 - 36.0 (g/dL)   RDW 13.5  11.5 - 15.5 (%)   Platelets 155  150 - 400 (K/uL)  CREATININE, SERUM     Status: Abnormal   Collection Time   08/11/11  4:56 PM      Component Value Range   Creatinine, Ser 0.98  0.50 - 1.35 (mg/dL)   GFR calc non Af Amer 79 (*) >90 (mL/min)   GFR calc Af Amer >90  >90 (mL/min)  BLOOD GAS, ARTERIAL     Status: Abnormal   Collection Time   08/11/11  5:30 PM      Component Value Range   O2 Content 4.0     pH, Arterial 7.433  7.350 - 7.450    pCO2 arterial 32.0 (*) 35.0 - 45.0 (mmHg)   pO2, Arterial 66.8 (*) 80.0 - 100.0 (mmHg)   Bicarbonate 21.0  20.0 - 24.0 (mEq/L)   TCO2 22.0  0 - 100 (mmol/L)   Acid-base deficit 2.6 (*) 0.0 - 2.0 (mmol/L)   O2 Saturation 93.9     Patient temperature 98.6     Collection site LEFT RADIAL     Drawn by [id number]     Sample type ARTERIAL DRAW     Allens test (pass/fail) PASS  PASS   BLOOD GAS, ARTERIAL  Status: Abnormal   Collection Time   08/12/11  5:35 AM      Component Value Range   O2 Content 2.0     Delivery systems NASAL CANNULA     pH, Arterial 7.439  7.350 - 7.450    pCO2 arterial 33.3 (*) 35.0 - 45.0 (mmHg)   pO2, Arterial 60.1 (*) 80.0 - 100.0 (mmHg)   Bicarbonate 22.2  20.0 - 24.0 (mEq/L)   TCO2 23.2  0 - 100 (mmol/L)   Acid-base deficit 1.4  0.0 - 2.0 (mmol/L)   O2 Saturation 91.7     Patient temperature 98.6     Collection site RIGHT RADIAL     Drawn by [id number]     Sample type ARTERIAL     Allens test (pass/fail) PASS  PASS   COMPREHENSIVE METABOLIC PANEL     Status: Abnormal   Collection Time   08/12/11  6:05 AM      Component Value Range   Sodium 136  135 - 145 (mEq/L)   Potassium 3.9  3.5 - 5.1 (mEq/L)   Chloride 103  96 - 112 (mEq/L)    CO2 24  19 - 32 (mEq/L)   Glucose, Bld 133 (*) 70 - 99 (mg/dL)   BUN 21  6 - 23 (mg/dL)   Creatinine, Ser 1.00  0.50 - 1.35 (mg/dL)   Calcium 8.9  8.4 - 10.5 (mg/dL)   Total Protein 5.5 (*) 6.0 - 8.3 (g/dL)   Albumin 2.8 (*) 3.5 - 5.2 (g/dL)   AST 14  0 - 37 (U/L)   ALT 16  0 - 53 (U/L)   Alkaline Phosphatase 58  39 - 117 (U/L)   Total Bilirubin 0.3  0.3 - 1.2 (mg/dL)   GFR calc non Af Amer 72 (*) >90 (mL/min)   GFR calc Af Amer 83 (*) >90 (mL/min)    Imaging results:  Dg Chest 2 View  08/11/2011  *RADIOLOGY REPORT*  Clinical Data: Shortness of breath, cough, congestion and hypoxia.  CHEST - 2 VIEW  Comparison: Plain films of the chest 06/08/2011 and CT chest 03/12/2011.  Findings: Lungs are markedly emphysematous.  Calcified granulomata are again seen.  No new airspace disease or effusion.  Heart size is normal.  IMPRESSION: Severe emphysema.  Stable compared to prior exam.  Original Report Authenticated By: Arvid Right. Luther Parody, M.D.    Assessment and Plan: I agree with the formulated Assessment and Plan with the following changes: Flu.  Patient positive for influenza A.  Symptomatic care, Tamiflu, inhalers.  O2 chronically low at baseline, continue prednisone.  Likely needs 24 hours a day of o2 at home.

## 2011-08-12 NOTE — Discharge Summary (Signed)
Internal [facility] Discharge Note  Name: Bruce Travis MRN: [id number] DOB: 09/28/1934 76 y.o.  Date of Admission: 08/11/2011  9:13 AM Date of Discharge: 08/12/2011 Attending Physician: Scharlene Gloss, MD  Discharge Diagnosis: Principal Problem:  *Influenza A Active Problems:  COPD exacerbation  Discharge Medications: Discharge Medication List as of 08/12/2011 10:42 AM    START taking these medications   Details  acetaminophen (TYLENOL) 325 MG tablet Take 2 tablets (650 mg total) by mouth every 6 (six) hours as needed (or Fever >/= 101)., Starting 08/12/2011, Until Sun 08/22/11, Normal    oseltamivir (TAMIFLU) 75 MG capsule Take 1 capsule (75 mg total) by mouth 2 (two) times daily., Starting 08/12/2011, Until Sun 08/22/11, Normal    predniSONE (DELTASONE) 10 MG tablet Take 4 tabs (40 mg) at once for 2 days THEN 2 tabs (20 mg) at once for 2 days THEN 1 tab (10 mg) for 2 days THEN STOP, Normal      CONTINUE these medications which have NOT CHANGED   Details  Ascorbic Acid (VITAMIN C) 1000 MG tablet Take 1,000 mg by mouth 2 (two) times daily.  , Until Discontinued, Historical Med    aspirin 81 MG tablet Take 81 mg by mouth daily.  , Until Discontinued, Historical Med    atorvastatin (LIPITOR) 40 MG tablet Take 40 mg by mouth at bedtime. , Until Discontinued, Historical Med    budesonide-formoterol (SYMBICORT) 160-4.5 MCG/ACT inhaler Take 2 puffs first thing in am and then another 2 puffs about 12 hours later.   , Print    Coenzyme Q10 (COQ10) 100 MG CAPS Take 1 capsule by mouth 2 (two) times daily.  , Until Discontinued, Historical Med    DIOVAN 80 MG tablet Take 80 mg by mouth daily. 1 by mouth daily, Starting 03/08/2011, Until Discontinued, Historical Med    HYDROcodone-homatropine (HYCODAN) 5-1.5 MG/5ML syrup 1-2 tsp every 4-6hrs as needed for cough, Phone In    lansoprazole (PREVACID) 30 MG capsule Take 15 mg by mouth 2 (two) times daily. Take 30-60 min  before first meal of the day , Starting 08/10/2011, Until Wed 08/09/12, Historical Med    mometasone (NASONEX) 50 MCG/ACT nasal spray Place 2 sprays into the nose daily as needed., Starting 07/02/2011, Until Discontinued, Normal    montelukast (SINGULAIR) 10 MG tablet Take 1 tablet (10 mg total) by mouth daily., Starting 05/24/2011, Until Tue 05/23/12, Normal    albuterol (PROAIR HFA) 108 (90 BASE) MCG/ACT inhaler Inhale 2 puffs into the lungs every 4 (four) hours as needed., Starting 07/02/2011, Until Discontinued, Normal      STOP taking these medications     moxifloxacin (AVELOX) 400 MG tablet         Disposition and follow-up:   Bruce Travis was discharged from [facility] in Stable condition.    Follow-up Appointments: Follow-up Information    Follow up with [facility] TOM, MD. (9:15am [place])    Contact information:   [address] [place] [phone]         Discharge Orders    Future Appointments: Provider: Department: Dept Phone: Center:   09/09/2011 2:15 PM Bruce Edison, NP [facility] [phone] None     Future Orders Please Complete By Expires   Diet - low sodium heart healthy      Increase activity slowly      Discharge instructions      Comments:   Pick  up your medicines from [facility] in [place] and take them as prescribed. Follow up with Dr. Dennard Schaumann.      Consultations:     Procedures Performed:  Dg Chest 2 View  08/11/2011  *RADIOLOGY REPORT*  Clinical Data: Shortness of breath, cough, congestion and hypoxia.  CHEST - 2 VIEW  Comparison: Plain films of the chest 06/08/2011 and CT chest 03/12/2011.  Findings: Lungs are markedly emphysematous.  Calcified granulomata are again seen.  No new airspace disease or effusion.  Heart size is normal.  IMPRESSION: Severe emphysema.  Stable compared to prior exam.  Original Report Authenticated By: Arvid Right. Luther Parody, M.D.      Admission HPI: Pt is a 76 y/o M with PMH significant for COPD (on 2L O2 by [place] at night), laryngeal cancer s/p surgical resection who presents with cough, fever, and sob that began 4 days PTA. He reports his cough is productive of clear sputum; denies hemoptysis. He was evaluated by his PCP on Monday (2 days PTA) and was prescribed Avelox; he reports no improvement of his symptoms despite 3 days of therapy. He has developed worsening SOB and DOE over the past few days and admits to feeling very tired and fatigued. He denies any chest pain or syncope. He reports fevers and chills with a home temps of 101-102; tmax of 102 on Sunday evening. He denies any nausea, vomiting, diarrhea, abdominal pain, headache, sore throat, visual changes, or other associated complaint. He denies any sick contacts or recent travel. He has one horse, two dogs, and three cats at home; reports all animals are healthy. He denies any other animal exposure; no birds at home. He admits to diagnosis of COPD but states he has never required treatment with steroids and have never been intubated. He is UTD on annual flu and pneumonia vaccinations. He has no other concerns or complaints.   Physical Exam:  Blood pressure 122/58, pulse 95, temperature 99.9 F (37.7 C), temperature source Rectal, resp. rate 21, height 5' 7"  (1.702 m), weight 138 lb (62.596 kg), SpO2 95.00%.  VItal signs reviewed and stable.  GEN: No apparent distress. Alert and oriented x 3. Pleasant, conversant, and cooperative to exam.  HEENT: head is autraumatic and normocephalic. Neck is supple without palpable masses or lymphadenopathy. No JVD or carotid bruits. Vision intact. EOMI. PERRLA. Sclerae anicteric. Conjunctivae without pallor or injection. Mucous membranes are moist. Oropharynx is without erythema, exudates, or other abnormal lesions.  RESP: Significantly diminished breath sounds throughout bilateral lung fields; poor air movement. Slightly prolonged  expiratory phase noted. No ronchi, wheezes, crackles, or rubs.  CARDIOVASCULAR: Very distant S1, S2. Irregular rhythm, normal rate. No M/G/R.  ABDOMEN: soft, non-tender, non-distended. Bowels sounds present in all quadrants and normoactive. No palpable masses.  EXT: warm and dry. Peripheral pulses equal, intact, and +2 globally. No clubbing or cyanosis. No edema in bilateral lower extremities.  SKIN: warm and dry with normal turgor. No rashes or abnormal lesions observed.  NEURO: No focal deficit.  Hospital Course by problem list:  1. Influenza A: Patient's presented with fever, cough, and malaise and found to be positive for Influenza A on  PCR obtained in the ED. CXR did not reveal any focal consolidation/infiltrate or evidence of acute cardiopulmonary process. Patient was started on Avelox as outpatient for presumed pneumonia.  This was discontinued after influenza testing results.  He was placed on droplet isolation and started on Tamiflu for 5 day course of therapy.  He will follow-up with his  PCP Dr. Dennard Schaumann on 09/17/2010.   2. Acute COPD exacerbation: likely secondary to influenza A infection.  ABG demonstrated hypoxia with slight respiratory alkalosis which may be chronic for Mr. Cozby. He is on home oxygen at night and was saturating oxygen at 94% on room air on day of discharge. Patient improved in shortness of breath after treatment with nebulized Albuterol and Atrovent.  He was started on oral steroids and discharged with Prednisone taper and consent to use his oxygen throughout the day, if needed.  Of note, Mr. Bojarski had EKG which showed left axis deviation and incomplete fascicular block.  He was monitored on telemetry which revealed frequent PVCs and non-sustained ventricular tachycardia but remained asymptomatic.  He had negative troponin and no evidence of ischemia. Upon follow-up with PCP Dr. Dennard Schaumann, further cardiac evaluation should be a consideration.  3. Disposition:  Mr. Benkert was  anxious to be discharged in order to return to the comfort of his home and company of his pet cat.  He was determined to be in stable and improved condition with advisement to contact physician if his symptoms worsened.Marland Kitchen   Discharge Vitals:  BP 114/66  Pulse 78  Temp(Src) 98.2 F (36.8 C) (Oral)  Resp 18  Ht 5' 7"  (1.702 m)  Wt 138 lb (62.596 kg)  BMI 21.61 kg/m2  SpO2 93%  Discharge Labs:  Results for orders placed during the hospital encounter of 08/11/11 (from the past 24 hour(s))  CBC     Status: Normal   Collection Time   08/11/11  4:56 PM      Component Value Range   WBC 5.5  4.0 - 10.5 (K/uL)   RBC 4.73  4.22 - 5.81 (MIL/uL)   Hemoglobin 16.0  13.0 - 17.0 (g/dL)   HCT 47.1  39.0 - 52.0 (%)   MCV 99.6  78.0 - 100.0 (fL)   MCH 33.8  26.0 - 34.0 (pg)   MCHC 34.0  30.0 - 36.0 (g/dL)   RDW 13.5  11.5 - 15.5 (%)   Platelets 155  150 - 400 (K/uL)  CREATININE, SERUM     Status: Abnormal   Collection Time   08/11/11  4:56 PM      Component Value Range   Creatinine, Ser 0.98  0.50 - 1.35 (mg/dL)   GFR calc non Af Amer 79 (*) >90 (mL/min)   GFR calc Af Amer >90  >90 (mL/min)  BLOOD GAS, ARTERIAL     Status: Abnormal   Collection Time   08/11/11  5:30 PM      Component Value Range   O2 Content 4.0     pH, Arterial 7.433  7.350 - 7.450    pCO2 arterial 32.0 (*) 35.0 - 45.0 (mmHg)   pO2, Arterial 66.8 (*) 80.0 - 100.0 (mmHg)   Bicarbonate 21.0  20.0 - 24.0 (mEq/L)   TCO2 22.0  0 - 100 (mmol/L)   Acid-base deficit 2.6 (*) 0.0 - 2.0 (mmol/L)   O2 Saturation 93.9     Patient temperature 98.6     Collection site LEFT RADIAL     Drawn by [id number]     Sample type ARTERIAL DRAW     Allens test (pass/fail) PASS  PASS   BLOOD GAS, ARTERIAL     Status: Abnormal   Collection Time   08/12/11  5:35 AM      Component Value Range   O2 Content 2.0     Delivery systems NASAL CANNULA     pH,  Arterial 7.439  7.350 - 7.450    pCO2 arterial 33.3 (*) 35.0 - 45.0 (mmHg)   pO2, Arterial  60.1 (*) 80.0 - 100.0 (mmHg)   Bicarbonate 22.2  20.0 - 24.0 (mEq/L)   TCO2 23.2  0 - 100 (mmol/L)   Acid-base deficit 1.4  0.0 - 2.0 (mmol/L)   O2 Saturation 91.7     Patient temperature 98.6     Collection site RIGHT RADIAL     Drawn by [id number]     Sample type ARTERIAL     Allens test (pass/fail) PASS  PASS   COMPREHENSIVE METABOLIC PANEL     Status: Abnormal   Collection Time   08/12/11  6:05 AM      Component Value Range   Sodium 136  135 - 145 (mEq/L)   Potassium 3.9  3.5 - 5.1 (mEq/L)   Chloride 103  96 - 112 (mEq/L)   CO2 24  19 - 32 (mEq/L)   Glucose, Bld 133 (*) 70 - 99 (mg/dL)   BUN 21  6 - 23 (mg/dL)   Creatinine, Ser 1.00  0.50 - 1.35 (mg/dL)   Calcium 8.9  8.4 - 10.5 (mg/dL)   Total Protein 5.5 (*) 6.0 - 8.3 (g/dL)   Albumin 2.8 (*) 3.5 - 5.2 (g/dL)   AST 14  0 - 37 (U/L)   ALT 16  0 - 53 (U/L)   Alkaline Phosphatase 58  39 - 117 (U/L)   Total Bilirubin 0.3  0.3 - 1.2 (mg/dL)   GFR calc non Af Amer 72 (*) >90 (mL/min)   GFR calc Af Amer 83 (*) >90 (mL/min)    Signed: Dorian Heckle 08/12/2011, 1:37 PM

## 2011-08-17 LAB — CULTURE, BLOOD (ROUTINE X 2)
Culture  Setup Time: 201212191404
Culture  Setup Time: 201212191404
Culture: NO GROWTH

## 2011-08-19 ENCOUNTER — Telehealth: Payer: Self-pay | Admitting: *Deleted

## 2011-08-19 NOTE — Telephone Encounter (Signed)
Message copied by Rosana Berger on Thu Aug 19, 2011  5:25 PM ------      Message from: Rosana Berger      Created: Thu Jun 10, 2011  9:01 AM       Pt needs f/u with cxr by 09/09/11

## 2011-08-19 NOTE — Telephone Encounter (Signed)
Pt already scheduled for ov with TP 09/09/11, need to just remind him of this appt and be sure he keeps this b/c he is due to have cxr LMTCB

## 2011-08-20 NOTE — Telephone Encounter (Signed)
Pt aware to keep appt 

## 2011-08-20 NOTE — Telephone Encounter (Signed)
Reminded pt of appt & the need to keep this appointment b/c of cxr.  Pt stated nothing further needed.  Bruce Travis

## 2011-09-06 ENCOUNTER — Other Ambulatory Visit: Payer: Self-pay | Admitting: Internal Medicine

## 2011-09-09 ENCOUNTER — Encounter: Payer: Self-pay | Admitting: Adult Health

## 2011-09-09 ENCOUNTER — Ambulatory Visit (INDEPENDENT_AMBULATORY_CARE_PROVIDER_SITE_OTHER): Payer: Medicare Other | Admitting: Adult Health

## 2011-09-09 DIAGNOSIS — J984 Other disorders of lung: Secondary | ICD-10-CM

## 2011-09-09 DIAGNOSIS — J449 Chronic obstructive pulmonary disease, unspecified: Secondary | ICD-10-CM

## 2011-09-09 NOTE — Patient Instructions (Addendum)
Continue on current regimen.  follow up Dr. Melvyn Novas  In 2 months with chest xray and As needed

## 2011-09-09 NOTE — Assessment & Plan Note (Signed)
Recent flare with Influenza , now resolved  Back to baseline.  Cont on current regimen.

## 2011-09-09 NOTE — Assessment & Plan Note (Signed)
Continue to follow with serial cxr and consider repeat CT chest at 1 year (02/2012)  follow up Dr. Melvyn Novas  In 2 months with cxr

## 2011-09-09 NOTE — Progress Notes (Signed)
Subjective:    Patient ID: Bruce Travis, male    DOB: 1935-03-28, 77 y.o.   MRN: [id number]  HPI 55 yowm with a history of COPD, remote laryngeal cancer with some dysphagia, allergic rhinitis, and esophageal reflux. He has also been followed for chronic nodular granulomatous disease and bronchiectasis on CT scan chest, stable on CT for 2 + yrs (2009) .  ROV 04/03/10 -- returns for f/u COPD, allergic rhinitis, GERD, hx laryngeal CA. Recent treatment for acute sinusitis with Augmentin, also added atrovent NS to his other allergy regimen. He feels better, HA improved, mucous is now clear. Breathing has been good. Recently started on doxycycline to treat a dermatitis.   ROV 07/22/10 -- COPD, sinus disease, allergies. Old granulomatous disease. Taking clarinex, nasonex, mucinex DM. Quit atrovent nose spray. Taking Advair two times a day, rare ProAir use. No exacerbations since last visit. Doesn't rinse mouth out after the Advair, has thrush.   08/19/10-Presents for post hospital visit. Admitted 08/04/2010- 08/07/2010 for COPD exacerbation and PNA -bilateral lower lobe PNA. Tx w/ Rocephin and Zithromax. along w/ steroid taper and nebs. Discharged on [place]. Returns today w/ persistent cough with thick green mucus and dyspnea.   ROV 09/08/10 -- follow up for COPD, chronic rhinitis, allergies, chronic sinusitis, old granulomatous disease.   ROV 10/26/10 -- COPD, cough/rhinitis, sinusitis. Started atrovent nasal spray three times a day, it helped with nasal drainage. We also stopped Advair to see if it would help cough, throat. He wants to go back better.   7/20 /2012 Acute OV  Pt presents for an acute work in visit. Complains of increased cough with clear to yellow mucus.   Says he feels "gurgling" in his chest when he lays down at night. Says cough never goes completely away. Aggravating him all the times. OTC cough products do work.  More wheezing last few days. Last CT chest 2010 showed stable granulomatous  dz. Stable nodules, he is due for follow up CT scan to follow nodules. No weight loss, no hemoptysis. >>Doxycycline x 7 days , rx spiriva, and advair stopped CT chest   04/02/2011 Follow up NP Pt returns for follow up . Last visit with COPD flare tx w/ doxycyline x 7 days . He says he is feeling better but cough has not went away.  Still having chest congestion with beige mucus, hoarseness. We stopped Advair and started Spiriva.  He has noticed some  difficulty urinating and having to strain-says it is mild and not to bothersome. No dysuria or discharge. No back pain.  Most aggravating symptom is persistent cough, esp at night. No fever or chest pain. He does have a hx of throat cancer in past >20 years ago. Followed by ENT Dr. [name] in past. Says he has intermittent "aspiration " with certain foods, He has changed his diet which has helped. He has had recurrent PNA in past .   Last visit. CT chest was done for lung nodule surveillance, which showed Severe centrilobular emphysema with areas of new and persistent  nodularity given the multiplicity of findings, these could represent areas of progressive scarring. One more underlying primary lung neoplasm(s) versus an unusual appearance of pulmonary metastasis cannot be excluded. If the the patient is a potential surgery or treatment candidate, PET should be considered for further evaluation. If this is not performed, short-term follow-up chest CT at 3 months would be recommended. 2. Lower lobe findings which may represent infection or aspiration. He has an ov in 1  week to discuss with Dr. Melvyn Novas  .  rec We are setting you up for a Modified Barium Swallow to evaluate swallowing.  Would like for you to see Dr. [name] for your hoarseness/cough  Prednisone taper over next week.  Add Pepcid 66m At bedtime   Mucinex DM Twice daily  As needed  Cough Begin Spiriva Handihaler 1 puff daily , rinse after use.  Hydromet 1-2 tsp every 4-6 hr As needed   Cough  04/08/2011 ov/[name] cc cough much better, sob only with heavy ex, rare need for albuterol. Finishing up prednisone.  No longer on fish oil.  W/u by NLucia Gaskinsand ST in progress. rec Try taking prevacid 30 mg x 360mefore bfast daily  Stop spiriva for now (it's like high octane fuel for breathing - if you can do more activity with spiriva daily  and less need for albuterol rescue spray then restart spiriva- if not, leave it off  Please schedule a follow up office visit in 4 weeks, sooner if needed with cxr and pft's  06/08/2011 f/u ov/[name] cough gone but cc doe worse in cold damp weather, better p albuterol and no change off spiriva / advair. No sign cough/ excess mucus day or night. >>no changes   09/09/2011 Follow up  Pt returns for follow up .  Was admitted last month for influenza A and AECOPD , tx with tamiflu and steroids and nebs.  Is improving . Cough and dyspnea are back to his baseline.  CXR on 12/17 with chronic changes only.  No hemoptysis or fever   ROS:  Constitutional:   No  weight loss, night sweats,  Fevers, chills,  +fatigue, or  lassitude.  HEENT:   No headaches,  Difficulty swallowing,  Tooth/dental problems, or  Sore throat,                No sneezing, itching, ear ache, nasal congestion, post nasal drip,   CV:  No chest pain,  Orthopnea, PND, swelling in lower extremities, anasarca, dizziness, palpitations, syncope.   GI  No heartburn, indigestion, abdominal pain, nausea, vomiting, diarrhea, change in bowel habits, loss of appetite, bloody stools.   Resp:   No coughing up of blood.     No chest wall deformity  Skin: no rash or lesions.  GU: no dysuria, change in color of urine, no urgency or frequency.  No flank pain, no hematuria   MS:  No joint pain or swelling.  No decreased range of motion.     Psych:  No change in mood or affect. No depression or anxiety.  No memory loss.          Objective:   Physical Exam  Hoarse amb wm nad Wt  140  04/08/2011 > 06/08/2011 141 >136 09/09/2011  HEENT mild turbinate edema.  Oropharynx no thrush or excess pnd or cobblestoning.  No JVD or cervical adenopathy. Mild accessory muscle hypertrophy. Trachea midline, nl thryroid. Chest was hyperinflated by percussion with diminished breath sounds  Regular rate and rhythm without murmur gallop or rub or increase P2 or edema.  Abd: no hsm, nl excursion. Ext warm without cyanosis or clubbing.       CXR  06/08/2011 : COPD and calcified granulomata. Stable compared to prior exam.     Assessment & Plan:

## 2011-11-08 ENCOUNTER — Encounter: Payer: Self-pay | Admitting: Internal Medicine

## 2011-11-08 ENCOUNTER — Ambulatory Visit (INDEPENDENT_AMBULATORY_CARE_PROVIDER_SITE_OTHER): Payer: Medicare Other | Admitting: Internal Medicine

## 2011-11-08 VITALS — BP 136/88 | HR 97 | Temp 97.8°F | Ht 67.0 in | Wt 141.4 lb

## 2011-11-08 DIAGNOSIS — J449 Chronic obstructive pulmonary disease, unspecified: Secondary | ICD-10-CM

## 2011-11-08 DIAGNOSIS — J310 Chronic rhinitis: Secondary | ICD-10-CM

## 2011-11-08 DIAGNOSIS — J984 Other disorders of lung: Secondary | ICD-10-CM

## 2011-11-08 MED ORDER — AMOXICILLIN-POT CLAVULANATE 875-125 MG PO TABS: 1.0000 | ORAL_TABLET | Freq: Two times a day (BID) | ORAL | Status: AC

## 2011-11-08 MED ORDER — PREDNISONE (PAK) 10 MG PO TABS: ORAL_TABLET | ORAL | Status: AC

## 2011-11-08 NOTE — Progress Notes (Signed)
Subjective:    Patient ID: Bruce Travis, male    DOB: Jun 18, 1935    MRN: [id number]  Brief patient profile:   67 yowm with a history of COPD, remote laryngeal cancer with some dysphagia, allergic rhinitis, and esophageal reflux. He has also been followed for chronic nodular granulomatous disease and bronchiectasis on CT scan chest, stable on CT for 2 + yrs (2009) .  ROV 04/03/10 -- returns for f/u COPD, allergic rhinitis, GERD, hx laryngeal CA. Recent treatment for acute sinusitis with Augmentin, also added atrovent NS to his other allergy regimen. He feels better, HA improved, mucous is now clear. Breathing has been good. Recently started on doxycycline to treat a dermatitis.   ROV 07/22/10 -- COPD, sinus disease, allergies. Old granulomatous disease. Taking clarinex, nasonex, mucinex DM. Quit atrovent nose spray. Taking Advair two times a day, rare ProAir use. No exacerbations since last visit. Doesn't rinse mouth out after the Advair, has thrush.   08/19/10-Presents for post hospital visit. Admitted 08/04/2010- 08/07/2010 for COPD exacerbation and PNA -bilateral lower lobe PNA. Tx w/ Rocephin and Zithromax. along w/ steroid taper and nebs. Discharged on [place]. Returns today w/ persistent cough with thick green mucus and dyspnea.   ROV 09/08/10 -- follow up for COPD, chronic rhinitis, allergies, chronic sinusitis, old granulomatous disease.   ROV 10/26/10 -- COPD, cough/rhinitis, sinusitis. Started atrovent nasal spray three times a day, it helped with nasal drainage. We also stopped Advair to see if it would help cough, throat. He wants to go back better.   7/20 /2012 Acute OV  Pt presents for an acute work in visit. Complains of increased cough with clear to yellow mucus.   Says he feels "gurgling" in his chest when he lays down at night. Says cough never goes completely away. Aggravating him all the times. OTC cough products do work.  More wheezing last few days. Last CT chest 2010 showed stable  granulomatous dz. Stable nodules, he is due for follow up CT scan to follow nodules. No weight loss, no hemoptysis. >>Doxycycline x 7 days , rx spiriva, and advair stopped CT chest   04/02/2011 Follow up NP Pt returns for follow up . Last visit with COPD flare tx w/ doxycyline x 7 days . He says he is feeling better but cough has not went away.  Still having chest congestion with beige mucus, hoarseness. We stopped Advair and started Spiriva.  He has noticed some  difficulty urinating and having to strain-says it is mild and not to bothersome. No dysuria or discharge. No back pain.  Most aggravating symptom is persistent cough, esp at night. No fever or chest pain. He does have a hx of throat cancer in past >20 years ago. Followed by ENT Dr. [name] in past. Says he has intermittent "aspiration " with certain foods, He has changed his diet which has helped. He has had recurrent PNA in past .   Last visit. CT chest was done for lung nodule surveillance, which showed Severe centrilobular emphysema with areas of new and persistent  nodularity given the multiplicity of findings, these could represent areas of progressive scarring. One more underlying primary lung neoplasm(s) versus an unusual appearance of pulmonary metastasis cannot be excluded. If the the patient is a potential surgery or treatment candidate, PET should be considered for further evaluation. If this is not performed, short-term follow-up chest CT at 3 months would be recommended. 2. Lower lobe findings which may represent infection or aspiration. He has an  ov in 1 week to discuss with Dr. Melvyn Novas  .  rec We are setting you up for a Modified Barium Swallow to evaluate swallowing.  Would like for you to see Dr. [name] for your hoarseness/cough  Prednisone taper over next week.  Add Pepcid 41m At bedtime   Mucinex DM Twice daily  As needed  Cough Begin Spiriva Handihaler 1 puff daily , rinse after use.  Hydromet 1-2 tsp every 4-6 hr As needed   Cough  04/08/2011 ov/Bruce Travis cc cough much better, sob only with heavy ex, rare need for albuterol. Finishing up prednisone.  No longer on fish oil.  W/u by NLucia Gaskinsand ST in progress. rec Try taking prevacid 30 mg x 361mefore bfast daily  Stop spiriva for now (it's like high octane fuel for breathing - if you can do more activity with spiriva daily  and less need for albuterol rescue spray then restart spiriva- if not, leave it off  Please schedule a follow up office visit in 4 weeks, sooner if needed with cxr and pft's  06/08/2011 f/u ov/Bruce Travis cough gone but cc doe worse in cold damp weather, better p albuterol and no change off spiriva / advair. No sign cough/ excess mucus day or night. >>no changes   09/09/2011 Np post hosp f/u p admitted last month for influenza A and AECOPD , tx with tamiflu and steroids and nebs.  Is improving . Cough and dyspnea are back to his baseline.  CXR on 12/17 with chronic changes only.  rec No change   11/08/2011 Bruce Travis/ f/u ov on maint rx with symbicort and spiriva c/o nasal symptoms worse but  breathing fine, no limits as long as paces himself and no cough.  Main nasal issue is congestion/itching/sneezing worse x 2 weeks with purulent bloody secretions s HA/toothache  But ahs noted  year round nasal symptoms and worse x 2 years which do not correlate with breathing status  Stopped nasonex due to insurance > no worse off it> has not f/u with NeLucia Gaskinset for ent re-eval  No daytime saba need  Sleeping ok without nocturnal  or early am exacerbation  of respiratory  c/o's or need for noct saba. Also denies any obvious fluctuation of symptoms with weather or environmental changes or other aggravating or alleviating factors except as outlined above   ROS  At present neg for  any significant sore throat, dysphagia,   fever, chills, sweats, unintended wt loss, pleuritic or exertional cp, hempoptysis, orthopnea pnd or leg swelling.  Also denies presyncope, palpitations,  heartburn, abdominal pain, nausea, vomiting, diarrhea  or change in bowel or urinary habits, dysuria,hematuria,  rash, arthralgias, visual complaints, headache, numbness weakness or ataxia.                     Objective:   Physical Exam  Hoarse amb wm nad Wt  140 04/08/2011 > 06/08/2011 141 >136 09/09/2011 > 141 11/08/2011  HEENT severe  turbinate edema with Mucopurulent discharge R > L.  Oropharynx no thrush or excess pnd or cobblestoning.  No JVD or cervical adenopathy. Mild accessory muscle hypertrophy. Trachea midline, nl thryroid. Chest was hyperinflated by percussion with diminished breath sounds  Regular rate and rhythm without murmur gallop or rub or increase P2 or edema.  Abd: no hsm, nl excursion. Ext warm without cyanosis or clubbing.       CXR  06/08/2011 : COPD and calcified granulomata. Stable compared to prior exam.     Assessment &  Plan:

## 2011-11-08 NOTE — Patient Instructions (Addendum)
augmentin 875 mg twice daily x 10days  Prednisone 10 mg take  4 each am x 2 days,   2 each am x 2 days,  1 each am x2days and stop  See Dr [name] in 2 weeks to let him direct you further on what to do about your chronic sinus symptoms   If you are satisfied with your treatment plan let your doctor know and he/she can either refill your medications or you can return here when your prescription runs out.     If in any way you are not 100% satisfied,  please tell us.  If 100% better, tell your friends!

## 2011-11-09 NOTE — Assessment & Plan Note (Signed)
-   PFT's 05/1611  FEV1  1.71 (67%) ratio 37 and no better p B2 with DLC0 54%   - HFA 75% p coaching 06/08/11  The proper method of use, as well as anticipated side effects, of a metered-dose inhaler are discussed and demonstrated to the patient. Improved effectiveness after extensive coaching during this visit to a level of approximately  90%   GOLD II and maintaining good fxn without flares despite poorly controlled rhinitis  Therefore pulmonary f/u can be prn

## 2011-11-09 NOTE — Assessment & Plan Note (Signed)
Acute flare with ? Sinusitis > rx x 10 d pred/augmentin then  F/u [employer]

## 2011-11-09 NOTE — Assessment & Plan Note (Signed)
Discussed in detail all the  indications, usual  risks and alternatives  relative to the benefits with patient who agrees to proceed with conservative f/u for CT Chest 03/11/12 placed in tickle file  Based on the multicentricity of these nodules and his resp status he is not a candidate for early intervention

## 2011-12-14 ENCOUNTER — Other Ambulatory Visit: Payer: Self-pay | Admitting: Adult Health

## 2012-01-10 ENCOUNTER — Inpatient Hospital Stay (HOSPITAL_COMMUNITY)
Admission: EM | Admit: 2012-01-10 | Discharge: 2012-01-13 | DRG: 189 | Disposition: A | Discharge status: Home / Self Care | Payer: Medicare Other | Source: Ambulatory Visit | Attending: Internal Medicine | Admitting: Internal Medicine

## 2012-01-10 ENCOUNTER — Emergency Department (HOSPITAL_COMMUNITY): Payer: Medicare Other

## 2012-01-10 ENCOUNTER — Encounter (HOSPITAL_COMMUNITY): Payer: Self-pay | Admitting: *Deleted

## 2012-01-10 DIAGNOSIS — K219 Gastro-esophageal reflux disease without esophagitis: Secondary | ICD-10-CM

## 2012-01-10 DIAGNOSIS — I2510 Atherosclerotic heart disease of native coronary artery without angina pectoris: Secondary | ICD-10-CM | POA: Diagnosis present

## 2012-01-10 DIAGNOSIS — Z66 Do not resuscitate: Secondary | ICD-10-CM | POA: Diagnosis present

## 2012-01-10 DIAGNOSIS — J9600 Acute respiratory failure, unspecified whether with hypoxia or hypercapnia: Principal | ICD-10-CM | POA: Diagnosis present

## 2012-01-10 DIAGNOSIS — J441 Chronic obstructive pulmonary disease with (acute) exacerbation: Secondary | ICD-10-CM | POA: Diagnosis present

## 2012-01-10 DIAGNOSIS — J9601 Acute respiratory failure with hypoxia: Secondary | ICD-10-CM | POA: Diagnosis present

## 2012-01-10 DIAGNOSIS — Z87891 Personal history of nicotine dependence: Secondary | ICD-10-CM

## 2012-01-10 DIAGNOSIS — J438 Other emphysema: Secondary | ICD-10-CM

## 2012-01-10 DIAGNOSIS — J309 Allergic rhinitis, unspecified: Secondary | ICD-10-CM | POA: Diagnosis present

## 2012-01-10 DIAGNOSIS — E785 Hyperlipidemia, unspecified: Secondary | ICD-10-CM | POA: Diagnosis present

## 2012-01-10 DIAGNOSIS — I10 Essential (primary) hypertension: Secondary | ICD-10-CM | POA: Diagnosis present

## 2012-01-10 DIAGNOSIS — J449 Chronic obstructive pulmonary disease, unspecified: Secondary | ICD-10-CM | POA: Diagnosis present

## 2012-01-10 LAB — URINALYSIS, ROUTINE W REFLEX MICROSCOPIC
Glucose, UA: NEGATIVE mg/dL
Ketones, ur: NEGATIVE mg/dL
Protein, ur: NEGATIVE mg/dL

## 2012-01-10 LAB — CBC
HCT: 50.8 % (ref 39.0–52.0)
MCH: 33.7 pg (ref 26.0–34.0)
MCV: 99.4 fL (ref 78.0–100.0)
RDW: 13.3 % (ref 11.5–15.5)
WBC: 6.8 10*3/uL (ref 4.0–10.5)

## 2012-01-10 LAB — BASIC METABOLIC PANEL
CO2: 23 mEq/L (ref 19–32)
Calcium: 9.5 mg/dL (ref 8.4–10.5)
Creatinine, Ser: 1.2 mg/dL (ref 0.50–1.35)
GFR calc non Af Amer: 57 mL/min — ABNORMAL LOW (ref >90)
Glucose, Bld: 118 mg/dL — ABNORMAL HIGH (ref 70–99)

## 2012-01-10 LAB — DIFFERENTIAL
Basophils Absolute: 0 10*3/uL (ref 0.0–0.1)
Eosinophils Absolute: 0 10*3/uL (ref 0.0–0.7)
Eosinophils Relative: 0 % (ref 0–5)
Lymphocytes Relative: 9 % — ABNORMAL LOW (ref 12–46)
Monocytes Absolute: 0.3 10*3/uL (ref 0.1–1.0)

## 2012-01-10 LAB — POCT I-STAT TROPONIN I: Troponin i, poc: 0 ng/mL (ref 0.00–0.08)

## 2012-01-10 LAB — URINE MICROSCOPIC-ADD ON

## 2012-01-10 MED ORDER — ACETAMINOPHEN 650 MG RE SUPP: 650.0000 mg | Freq: Four times a day (QID) | RECTAL | Status: DC | PRN

## 2012-01-10 MED ORDER — ONDANSETRON HCL 4 MG/2ML IJ SOLN: 4.0000 mg | Freq: Four times a day (QID) | INTRAMUSCULAR | Status: DC | PRN

## 2012-01-10 MED ORDER — IPRATROPIUM BROMIDE 0.02 % IN SOLN
0.5000 mg | Freq: Four times a day (QID) | RESPIRATORY_TRACT | Status: DC
  Administered 2012-01-11 – 2012-01-13 (×10): 0.5 mg via RESPIRATORY_TRACT
  Filled 2012-01-10 (×12): qty 2.5

## 2012-01-10 MED ORDER — SODIUM CHLORIDE 0.9 % IV SOLN
INTRAVENOUS | Status: AC
  Administered 2012-01-10: 22:00:00 via INTRAVENOUS

## 2012-01-10 MED ORDER — ASPIRIN 81 MG PO TABS: 81.0000 mg | ORAL_TABLET | Freq: Every day | ORAL | Status: DC

## 2012-01-10 MED ORDER — ALBUTEROL SULFATE (5 MG/ML) 0.5% IN NEBU
INHALATION_SOLUTION | RESPIRATORY_TRACT | Status: AC
  Filled 2012-01-10: qty 2

## 2012-01-10 MED ORDER — MONTELUKAST SODIUM 10 MG PO TABS
10.0000 mg | ORAL_TABLET | Freq: Every day | ORAL | Status: DC
  Administered 2012-01-10 – 2012-01-12 (×3): 10 mg via ORAL
  Filled 2012-01-10 (×4): qty 1

## 2012-01-10 MED ORDER — SODIUM CHLORIDE 0.9 % IV SOLN: 250.0000 mL | INTRAVENOUS | Status: DC | PRN

## 2012-01-10 MED ORDER — BISACODYL 5 MG PO TBEC: 5.0000 mg | DELAYED_RELEASE_TABLET | Freq: Every day | ORAL | Status: DC | PRN

## 2012-01-10 MED ORDER — LEVOFLOXACIN IN D5W 500 MG/100ML IV SOLN
500.0000 mg | INTRAVENOUS | Status: DC
  Administered 2012-01-10 – 2012-01-12 (×3): 500 mg via INTRAVENOUS
  Filled 2012-01-10 (×4): qty 100

## 2012-01-10 MED ORDER — SENNOSIDES-DOCUSATE SODIUM 8.6-50 MG PO TABS: 1.0000 | ORAL_TABLET | Freq: Every evening | ORAL | Status: DC | PRN

## 2012-01-10 MED ORDER — ALBUTEROL SULFATE (5 MG/ML) 0.5% IN NEBU: 2.5000 mg | INHALATION_SOLUTION | RESPIRATORY_TRACT | Status: DC

## 2012-01-10 MED ORDER — ONDANSETRON HCL 4 MG PO TABS: 4.0000 mg | ORAL_TABLET | Freq: Four times a day (QID) | ORAL | Status: DC | PRN

## 2012-01-10 MED ORDER — IRBESARTAN 75 MG PO TABS
75.0000 mg | ORAL_TABLET | Freq: Every day | ORAL | Status: DC
  Administered 2012-01-11 – 2012-01-13 (×3): 75 mg via ORAL
  Filled 2012-01-10 (×3): qty 1

## 2012-01-10 MED ORDER — ZOLPIDEM TARTRATE 5 MG PO TABS: 5.0000 mg | ORAL_TABLET | Freq: Every evening | ORAL | Status: DC | PRN

## 2012-01-10 MED ORDER — ASPIRIN EC 81 MG PO TBEC
81.0000 mg | DELAYED_RELEASE_TABLET | Freq: Every day | ORAL | Status: DC
  Administered 2012-01-11 – 2012-01-13 (×3): 81 mg via ORAL
  Filled 2012-01-10 (×3): qty 1

## 2012-01-10 MED ORDER — SODIUM CHLORIDE 0.9 % IJ SOLN: 3.0000 mL | INTRAMUSCULAR | Status: DC | PRN

## 2012-01-10 MED ORDER — ATORVASTATIN CALCIUM 40 MG PO TABS
40.0000 mg | ORAL_TABLET | Freq: Every day | ORAL | Status: DC
  Administered 2012-01-10 – 2012-01-12 (×3): 40 mg via ORAL
  Filled 2012-01-10 (×4): qty 1

## 2012-01-10 MED ORDER — PANTOPRAZOLE SODIUM 40 MG PO TBEC
40.0000 mg | DELAYED_RELEASE_TABLET | Freq: Every day | ORAL | Status: DC
  Administered 2012-01-11 – 2012-01-13 (×3): 40 mg via ORAL
  Filled 2012-01-10: qty 1

## 2012-01-10 MED ORDER — ENOXAPARIN SODIUM 40 MG/0.4ML ~~LOC~~ SOLN
40.0000 mg | SUBCUTANEOUS | Status: DC
  Administered 2012-01-10 – 2012-01-12 (×3): 40 mg via SUBCUTANEOUS
  Filled 2012-01-10 (×4): qty 0.4

## 2012-01-10 MED ORDER — LEVOFLOXACIN IN D5W 500 MG/100ML IV SOLN
500.0000 mg | INTRAVENOUS | Status: AC
  Filled 2012-01-10: qty 100

## 2012-01-10 MED ORDER — ALBUTEROL SULFATE (5 MG/ML) 0.5% IN NEBU: 2.5000 mg | INHALATION_SOLUTION | RESPIRATORY_TRACT | Status: DC | PRN

## 2012-01-10 MED ORDER — ACETAMINOPHEN 325 MG PO TABS: 650.0000 mg | ORAL_TABLET | Freq: Four times a day (QID) | ORAL | Status: DC | PRN

## 2012-01-10 MED ORDER — METHYLPREDNISOLONE SODIUM SUCC 125 MG IJ SOLR
60.0000 mg | Freq: Four times a day (QID) | INTRAMUSCULAR | Status: DC
  Administered 2012-01-10 – 2012-01-11 (×4): 60 mg via INTRAVENOUS
  Filled 2012-01-10 (×6): qty 0.96

## 2012-01-10 MED ORDER — ALBUTEROL SULFATE (5 MG/ML) 0.5% IN NEBU
2.5000 mg | INHALATION_SOLUTION | Freq: Four times a day (QID) | RESPIRATORY_TRACT | Status: DC
  Administered 2012-01-11 – 2012-01-13 (×10): 2.5 mg via RESPIRATORY_TRACT
  Filled 2012-01-10 (×11): qty 0.5

## 2012-01-10 MED ORDER — SODIUM CHLORIDE 0.9 % IJ SOLN
3.0000 mL | Freq: Two times a day (BID) | INTRAMUSCULAR | Status: DC
  Administered 2012-01-11 – 2012-01-13 (×5): 3 mL via INTRAVENOUS

## 2012-01-10 MED ORDER — FAMOTIDINE 20 MG PO TABS
20.0000 mg | ORAL_TABLET | Freq: Every day | ORAL | Status: DC
  Administered 2012-01-10 – 2012-01-12 (×3): 20 mg via ORAL
  Filled 2012-01-10 (×4): qty 1

## 2012-01-10 MED ORDER — ALBUTEROL (5 MG/ML) CONTINUOUS INHALATION SOLN
10.0000 mg/h | INHALATION_SOLUTION | Freq: Once | RESPIRATORY_TRACT | Status: AC
  Administered 2012-01-10: 10 mg/h via RESPIRATORY_TRACT

## 2012-01-10 MED ORDER — ALUM & MAG HYDROXIDE-SIMETH 200-200-20 MG/5ML PO SUSP: 30.0000 mL | Freq: Four times a day (QID) | ORAL | Status: DC | PRN

## 2012-01-10 NOTE — H&P (Signed)
History and Physical  Bruce Travis KCL:[id number] DOB: Jan 10, 1935 DOA: 01/10/2012  Referring physician: Trisha Mangle, MD PCP: Odette Fraction, MD, MD  Pulmonologist: Christinia Gully, M.D.  Chief Complaint: Shortness of breath  HPI:  77 year old man, former smoker with history of COPD was referred to the emergency department by his primary care physician after followup visit in the office today. Patient developed shortness of breath and cough 8 days ago which has progressively worsened. He saw his primary care physician in the office 3 days ago and was started on prednisone 60 mg daily and breathing treatments. Despite these interventions the patient failed to improve. He reports his last round of antibiotics was approximately 3 weeks ago. In March and April he was treated with 2 rounds of antibiotics for a sinus infection.  In the emergency department patient was noted to be afebrile with a respiratory rate 19-24, pulse 80 and oxygen saturation 88% on room air, improved to 93% on 2 L. Point-of-care troponin and BNP were unremarkable. Chemistry panel and CBC unremarkable. Chest x-ray negative. He was given albuterol nebulizer the emergency department. The patient was given 60 mg steroids in the outpatient setting this morning.  Chart Review:  Pulmonary office visit 11/08/2011: COPD, chronic rhinitis whom pulmonary nodule.  Review of Systems:  Negative for fever, changes to his vision, sore throat, rash, new muscle aches, chest pain, dysuria, bleeding, nausea, vomiting.  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  Past Medical History  Diagnosis Date  . Personal history of colonic polyps 04/26/2007    hyperplastic   . Diverticulosis of colon (without mention of hemorrhage)   . Family hx of colon cancer   . COPD (chronic obstructive pulmonary disease)   . Esophageal reflux   . Hypertension   . Hyperlipemia   . CAD (coronary artery disease)   . Throat cancer   . Other diseases of lung, not  elsewhere classified   . Unspecified asthma   . Throat cancer   . Nephrolithiasis   . Hyperlipidemia   . Allergic rhinitis   . Bronchiectasis    Past Surgical History  Procedure Date  . Vasectomy   . Head and neck surgery   . 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 Hemorrhoid surgery   . Hand surgery     d/t crush injury   Social History:  reports that he quit smoking about 22 years ago. His smoking use included Cigarettes. He has a 70 pack-year smoking history. He has quit using smokeless tobacco. He reports that he does not drink alcohol or use illicit drugs.  Allergies  Allergen Reactions  . Codeine Other (See Comments)    Thought head was going to blow off    Family History  Problem Relation Age of Onset  . Heart disease Father   . Colon cancer Father   . Prostate cancer Father   . Stroke Mother    Prior to Admission medications   Medication Sig Start Date End Date Taking? Authorizing Provider  albuterol (PROVENTIL HFA;VENTOLIN HFA) 108 (90 BASE) MCG/ACT inhaler Inhale 2 puffs into the lungs every 4 (four) hours as needed. For shortness of breath   Yes Historical Provider, MD  aspirin 81 MG tablet Take 81 mg by mouth daily.    Yes Historical Provider, MD  atorvastatin (LIPITOR) 40 MG tablet Take 40 mg by mouth at bedtime.    Yes Historical Provider, MD  budesonide-formoterol (SYMBICORT) 160-4.5 MCG/ACT inhaler Inhale 2 puffs into  the lungs 2 (two) times daily.   Yes Historical Provider, MD  DIOVAN 80 MG tablet Take 80 mg by mouth daily. 1 by mouth daily 03/08/11  Yes Historical Provider, MD  famotidine (PEPCID) 20 MG tablet Take 20 mg by mouth at bedtime.   Yes Historical Provider, MD  HYDROcodone-homatropine (HYCODAN) 5-1.5 MG/5ML syrup Take 5-10 mLs by mouth every 4 (four) hours as needed. For cough   Yes Historical Provider, MD  ipratropium (ATROVENT) 0.02 % nebulizer solution Take 500 mcg by nebulization 4 (four) times daily as needed. For shortness of breat   Yes  Historical Provider, MD  lansoprazole (PREVACID) 15 MG capsule Take 30 mg by mouth daily.   Yes Historical Provider, MD  montelukast (SINGULAIR) 10 MG tablet Take 10 mg by mouth at bedtime.   Yes Historical Provider, MD  tiotropium (SPIRIVA) 18 MCG inhalation capsule Place 18 mcg into inhaler and inhale daily.   Yes Historical Provider, MD   Physical Exam: Filed Vitals:   01/10/12 1440 01/10/12 1454 01/10/12 1647  BP: 128/80  95/67  Pulse:   80  Temp: 98.2 F (36.8 C)  98.2 F (36.8 C)  TempSrc: Oral  Oral  Resp: 24  19  SpO2: 88% 92% 93%    General:  Examined in the emergency department. Calm, mildly uncomfortable. Nontoxic and able to speak in full sentences.  Eyes: Pupils equal and round. Normal lids and irises.  ENT: Grossly normal hearing. Lips and tongue appear unremarkable.  Neck: No lymphadenopathy or masses. No thyromegaly.  Cardiovascular: Regular rate and rhythm. No murmur, rub, gallop. No lower extremity edema.  Respiratory: Decreased breath sounds bilaterally with poor air movement. Wheezes on the right especially posteriorly. No frank rhonchi or rales. Mild-moderate increased work of breathing.  Abdomen: Soft, nontender, nondistended.  Skin: Appears grossly unremarkable.  Musculoskeletal: Able to sit up without difficulty. Appears grossly unremarkable.  Psychiatric: Grossly normal mood and affect. Speech fluent and appropriate.  Labs on Admission:  Basic Metabolic Panel:  Lab 89/38/10 1705  NA 137  K 4.4  CL 102  CO2 23  GLUCOSE 118*  BUN 24*  CREATININE 1.20  CALCIUM 9.5  MG --  PHOS --   CBC:  Lab 01/10/12 1705  WBC 6.8  NEUTROABS 5.8  HGB 17.2*  HCT 50.8  MCV 99.4  PLT 191   Troponin (Point of Care Test)  [facility] 01/10/12 1722  TROPIPOC 0.00   BNP (last 3 results)  Basename 01/10/12 1705  PROBNP 149.1   Radiological Exams on Admission: Dg Chest 2 View  01/10/2012  *RADIOLOGY REPORT*  Clinical Data: Shortness of breath.   CHEST - 2 VIEW  Comparison: 08/11/2011.  Findings: The cardiac silhouette, mediastinal and hilar contours are stable.  The lungs demonstrate chronic scarring changes and calcified granulomas.  Underlying emphysematous changes are again noted.  No definite acute overlying pulmonary process.  IMPRESSION:  1.  Chronic emphysematous changes and pulmonary scarring. 2.  No definite acute overlying pulmonary process.  Original Report Authenticated By: P. Kalman Jewels, M.D.   EKG: Independently reviewed. Sinus rhythm with PVCs. No acute changes.  Principal Problem:  *Acute respiratory failure Active Problems:  COPD exacerbation  COPD UNSPECIFIED  GERD  Allergic rhinitis  Hypertension   Assessment/Plan 1. Acute respiratory failure: Secondary to COPD exacerbation. 2. COPD exacerbation: IV steroids, antibiotics, nebulizer treatments, supplemental oxygen. 3. GERD: PPI, Pepcid. 4. Allergic rhinitis: Continue Singulair. 5. Hypertension: Continue Diovan. 6. History of coronary artery disease: Stable. Continue aspirin, Lipitor.  Code Status: DO NOT RESUSCITATE/DO NOT INTUBATE Family Communication: None at bedside Disposition Plan: Home when improved  Murray Hodgkins, MD  [facility] Pager [phone]  If 8PM-8AM, please contact floor/night-coverage at [email or web address], password [facility] 01/10/2012, 6:12 PM

## 2012-01-10 NOTE — ED Provider Notes (Signed)
History     CSN: [id number]  Arrival date & time 01/10/12  1434   First MD Initiated Contact with Patient 01/10/12 1637      Chief Complaint  Patient presents with  . Shortness of Breath  . Chest Pain    (Consider location/radiation/quality/duration/timing/severity/associated sxs/prior treatment) HPI Comments: Patient has been seen by his primary care physician for COPD. He has failed outpatient treatment. He is failed multiple rounds of steroids and aerosolized treatments at home. He was also on Avelox.  Patient is a 77 y.o. male presenting with shortness of breath. The history is provided by the patient. No language interpreter was used.  Shortness of Breath  The current episode started 3 to 5 days ago. The onset was gradual. The problem occurs continuously. The problem has been gradually worsening. The problem is moderate. The symptoms are relieved by nothing. The symptoms are aggravated by activity. Associated symptoms include cough, shortness of breath and wheezing. Pertinent negatives include no chest pain, no chest pressure, no orthopnea, no fever, no rhinorrhea and no sore throat. The cough's precipitants include activity. The cough is productive. Nothing worsens the cough. He has not inhaled smoke recently. He is currently using steroids. He has had prior hospitalizations. He has had prior ICU admissions. He has had no prior intubations.    Past Medical History  Diagnosis Date  . Personal history of colonic polyps 04/26/2007    hyperplastic   . Diverticulosis of colon (without mention of hemorrhage)   . Family hx of colon cancer   . COPD (chronic obstructive pulmonary disease)   . Esophageal reflux   . Hypertension   . Hyperlipemia   . CAD (coronary artery disease)   . Throat cancer   . Other diseases of lung, not elsewhere classified   . Unspecified asthma   . Throat cancer   . Nephrolithiasis   . Hyperlipidemia   . Allergic rhinitis   . GERD (gastroesophageal reflux  disease)   . Bronchiectasis     Past Surgical History  Procedure Date  . Vasectomy   . Head and neck surgery   . 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 Hemorrhoid surgery   . Hand surgery     d/t crush injury    Family History  Problem Relation Age of Onset  . Heart disease Father   . Colon cancer Father   . Prostate cancer Father   . Stroke Mother     History  Substance Use Topics  . Smoking status: Former Smoker -- 2.0 packs/day for 35 years    Types: Cigarettes    Quit date: 01/21/1989  . Smokeless tobacco: Former Systems developer  . Alcohol Use: No      Review of Systems  Constitutional: Negative for fever, activity change, appetite change and fatigue.  HENT: Positive for congestion. Negative for sore throat, rhinorrhea, neck pain and neck stiffness.   Respiratory: Positive for cough, shortness of breath and wheezing.   Cardiovascular: Negative for chest pain, palpitations and orthopnea.  Gastrointestinal: Negative for nausea, vomiting, abdominal pain and diarrhea.  Genitourinary: Negative for dysuria, urgency, frequency and flank pain.  Musculoskeletal: Negative for myalgias, back pain and arthralgias.  Neurological: Negative for dizziness, weakness, light-headedness, numbness and headaches.  All other systems reviewed and are negative.    Allergies  Codeine  Home Medications   Current Outpatient Rx  Name Route Sig Dispense Refill  . ALBUTEROL SULFATE HFA 108 (90 BASE) MCG/ACT IN AERS Inhalation Inhale  2 puffs into the lungs every 4 (four) hours as needed. For shortness of breath    . ASPIRIN 81 MG PO TABS Oral Take 81 mg by mouth daily.     . ATORVASTATIN CALCIUM 40 MG PO TABS Oral Take 40 mg by mouth at bedtime.     . BUDESONIDE-FORMOTEROL FUMARATE 160-4.5 MCG/ACT IN AERO Inhalation Inhale 2 puffs into the lungs 2 (two) times daily.    Marland Kitchen DIOVAN 80 MG PO TABS Oral Take 80 mg by mouth daily. 1 by mouth daily    . FAMOTIDINE 20 MG PO TABS Oral Take 20 mg by  mouth at bedtime.    Marland Kitchen HYDROCODONE-HOMATROPINE 5-1.5 MG/5ML PO SYRP Oral Take 5-10 mLs by mouth every 4 (four) hours as needed. For cough    . IPRATROPIUM BROMIDE 0.02 % IN SOLN Nebulization Take 500 mcg by nebulization 4 (four) times daily as needed. For shortness of breat    . LANSOPRAZOLE 15 MG PO CPDR Oral Take 30 mg by mouth daily.    Marland Kitchen MONTELUKAST SODIUM 10 MG PO TABS Oral Take 10 mg by mouth at bedtime.    Marland Kitchen TIOTROPIUM BROMIDE MONOHYDRATE 18 MCG IN CAPS Inhalation Place 18 mcg into inhaler and inhale daily.      BP 95/67  Pulse 80  Temp(Src) 98.2 F (36.8 C) (Oral)  Resp 19  SpO2 93%  Physical Exam  Nursing note and vitals reviewed. Constitutional: He is oriented to person, place, and time. He appears well-developed and well-nourished.  HENT:  Head: Normocephalic and atraumatic.  Mouth/Throat: Oropharynx is clear and moist.  Eyes: Conjunctivae and EOM are normal. Pupils are equal, round, and reactive to light.  Neck: Normal range of motion. Neck supple.  Cardiovascular: Normal rate, regular rhythm, normal heart sounds and intact distal pulses.  Exam reveals no gallop and no friction rub.   No murmur heard. Pulmonary/Chest: He is in respiratory distress (tachypneic). He has wheezes. He exhibits no tenderness.       Decreased breath sounds diffusely.  Speaking in partial sentences  Abdominal: Soft. Bowel sounds are normal. There is no tenderness. There is no rebound and no guarding.  Musculoskeletal: Normal range of motion. He exhibits no edema and no tenderness.  Neurological: He is alert and oriented to person, place, and time. No cranial nerve deficit.  Skin: Skin is warm and dry. No rash noted.    ED Course  Procedures (including critical care time)   Date: 01/10/2012  Rate: 86  Rhythm: normal sinus rhythm and premature atrial contractions (PAC)  QRS Axis: left  Intervals: normal  ST/T Wave abnormalities: normal  Conduction Disutrbances:right bundle branch block   Narrative Interpretation:   Old EKG Reviewed: unchanged  Labs Reviewed  CBC - Abnormal; Notable for the following:    Hemoglobin 17.2 (*)    All other components within normal limits  DIFFERENTIAL - Abnormal; Notable for the following:    Neutrophils Relative 86 (*)    Lymphocytes Relative 9 (*)    Lymphs Abs 0.6 (*)    All other components within normal limits  BASIC METABOLIC PANEL - Abnormal; Notable for the following:    Glucose, Bld 118 (*)    BUN 24 (*)    GFR calc non Af Amer 57 (*)    GFR calc Af Amer 66 (*)    All other components within normal limits  URINALYSIS, ROUTINE W REFLEX MICROSCOPIC - Abnormal; Notable for the following:    Hgb urine dipstick TRACE (*)  All other components within normal limits  PRO B NATRIURETIC PEPTIDE  POCT I-STAT TROPONIN I  URINE MICROSCOPIC-ADD ON   Dg Chest 2 View  01/10/2012  *RADIOLOGY REPORT*  Clinical Data: Shortness of breath.  CHEST - 2 VIEW  Comparison: 08/11/2011.  Findings: The cardiac silhouette, mediastinal and hilar contours are stable.  The lungs demonstrate chronic scarring changes and calcified granulomas.  Underlying emphysematous changes are again noted.  No definite acute overlying pulmonary process.  IMPRESSION:  1.  Chronic emphysematous changes and pulmonary scarring. 2.  No definite acute overlying pulmonary process.  Original Report Authenticated By: P. Kalman Jewels, M.D.     1. COPD exacerbation       MDM  COPD exacerbation with increased oxygen requirement. Received 2 breathing treatments prior to arrival. Did not administer steroids as he received 60 mg of prednisone earlier in the day. He was placed on an hour long continuous breathing treatment with continued increased oxygen requirement. He continues to have wheezing. He will be admitted for further evaluation and treatment. Chest x-ray with no infiltrates. Laboratory studies relatively unremarkable        Trisha Mangle, MD 01/10/12 [phone]

## 2012-01-10 NOTE — ED Notes (Signed)
Pt on stretcher, nad noted abc intact, pt denies needs at this time, complains of sob on set this am.

## 2012-01-10 NOTE — ED Notes (Signed)
Rt called from neb treatment.

## 2012-01-10 NOTE — ED Notes (Signed)
To ED for eval of increased SOB and CP since Friday. Pt is under the care of his PMD for same and is taking meds at home without relief.  Pt was placed on O2 ATC last week. Sob with conversation.

## 2012-01-11 DIAGNOSIS — J9600 Acute respiratory failure, unspecified whether with hypoxia or hypercapnia: Secondary | ICD-10-CM

## 2012-01-11 DIAGNOSIS — J438 Other emphysema: Secondary | ICD-10-CM

## 2012-01-11 DIAGNOSIS — J309 Allergic rhinitis, unspecified: Secondary | ICD-10-CM

## 2012-01-11 DIAGNOSIS — K219 Gastro-esophageal reflux disease without esophagitis: Secondary | ICD-10-CM

## 2012-01-11 MED ORDER — METHYLPREDNISOLONE SODIUM SUCC 125 MG IJ SOLR
60.0000 mg | Freq: Two times a day (BID) | INTRAMUSCULAR | Status: DC
  Administered 2012-01-11 – 2012-01-12 (×2): 60 mg via INTRAVENOUS
  Filled 2012-01-11 (×2): qty 0.96

## 2012-01-11 NOTE — Progress Notes (Signed)
[facility] PROGRESS NOTE  Bruce Travis QBV:[id number] DOB: February 23, 1935 DOA: 01/10/2012 PCP: Odette Fraction, MD, MD Pulmonologist: Christinia Gully, M.D.  Assessment/Plan: 1. Acute respiratory failure: Improving. Secondary to COPD exacerbation. 2. COPD exacerbation: Improving. Decrease steroids; continue antibiotics, nebulizer treatments, supplemental oxygen. 3. GERD: Stable. Continue PPI, Pepcid. 4. Allergic rhinitis: Stable. Continue Singulair. 5. Hypertension: Stable. Continue Diovan. 6. History of coronary artery disease: Stable. Continue aspirin, Lipitor.  Code Status: DO NOT RESUSCITATE/DO NOT INTUBATE  Family Communication: None at bedside  Disposition Plan: Home when improved  Murray Hodgkins, MD  [facility] Pager [phone]. If 8PM-8AM, please contact night-coverage at [email or web address], password [facility] 01/11/2012, 9:50 AM  LOS: 1 day   Brief narrative: 77 year old man presented with progressive shortness of breath, cough and was admitted for COPD exacerbation.  Consultants:  None  Procedures:  None  Antibiotics:  Levofloxacin May 21?  HPI/Subjective: Afebrile, vital signs stable. Oxygen requirement stable. Feels much better.  Objective: Filed Vitals:   01/10/12 1839 01/10/12 2200 01/11/12 0600 01/11/12 0728  BP: 143/70 138/76 115/65   Pulse: 87 78 75   Temp: 98 F (36.7 C) 98 F (36.7 C) 97.9 F (36.6 C)   TempSrc: Oral Oral Oral   Resp: 18 18 18    Height:  5' 7"  (1.702 m)    Weight:  61.9 kg (136 lb 7.4 oz)    SpO2: 99% 92% 93% 93%    Intake/Output Summary (Last 24 hours) at 01/11/12 0950 Last data filed at 01/11/12 0800  Gross per 24 hour  Intake 765.83 ml  Output    650 ml  Net 115.83 ml    Exam:   General:  Appears calm and comfortable.  Cardiovascular: Regular rate and rhythm. No murmur, rub, gallop.  Respiratory: Clear to auscultation today. No frank wheezes, rales, rhonchi. Decreased respiratory effort. Fair air  movement.  Data Reviewed: Basic Metabolic Panel:  Lab 17/91/50 1705  NA 137  K 4.4  CL 102  CO2 23  GLUCOSE 118*  BUN 24*  CREATININE 1.20  CALCIUM 9.5  MG --  PHOS --   CBC:  Lab 01/10/12 1705  WBC 6.8  NEUTROABS 5.8  HGB 17.2*  HCT 50.8  MCV 99.4  PLT 191    Basename 01/10/12 1705  PROBNP 149.1   Studies: Dg Chest 2 View  01/10/2012  *RADIOLOGY REPORT*  Clinical Data: Shortness of breath.  CHEST - 2 VIEW  Comparison: 08/11/2011.  Findings: The cardiac silhouette, mediastinal and hilar contours are stable.  The lungs demonstrate chronic scarring changes and calcified granulomas.  Underlying emphysematous changes are again noted.  No definite acute overlying pulmonary process.  IMPRESSION:  1.  Chronic emphysematous changes and pulmonary scarring. 2.  No definite acute overlying pulmonary process.  Original Report Authenticated By: P. Kalman Jewels, M.D.   Scheduled Meds:   . sodium chloride   Intravenous STAT  . ipratropium  0.5 mg Nebulization Q6H   And  . albuterol  2.5 mg Nebulization Q6H  . albuterol  10 mg/hr Nebulization Once  . aspirin EC  81 mg Oral Daily  . atorvastatin  40 mg Oral QHS  . enoxaparin  40 mg Subcutaneous Q24H  . famotidine  20 mg Oral QHS  . irbesartan  75 mg Oral Daily  . levofloxacin (LEVAQUIN) IV  500 mg Intravenous Q24H  . levofloxacin (LEVAQUIN) IV  500 mg Intravenous To Major  . methylPREDNISolone (SOLU-MEDROL) injection  60 mg Intravenous Q6H  . montelukast  10 mg  Oral QHS  . pantoprazole  40 mg Oral Q breakfast  . sodium chloride  3 mL Intravenous Q12H  . DISCONTD: albuterol  2.5 mg Nebulization Q4H  . DISCONTD: aspirin  81 mg Oral Daily   Continuous Infusions:   Principal Problem:  *Acute respiratory failure Active Problems:  COPD exacerbation  COPD UNSPECIFIED  GERD  Allergic rhinitis  Hypertension

## 2012-01-11 NOTE — Care Management Note (Signed)
    Page 1 of 1   01/14/2012     8:36:48 AM   CARE MANAGEMENT NOTE 01/14/2012  Patient:  Bruce Travis, Bruce Travis   Account Number:  [id number]  Date Initiated:  01/11/2012  Documentation initiated by:  [facility]  Subjective/Objective Assessment:   Admitted with COPD exacerbation. Lives with wife, has home O2 with [facility]. Independent with mobility and ADLs.     Action/Plan:   Anticipated DC Date:  01/14/2012   Anticipated DC Plan:  HOME/SELF CARE      DC Planning Services  CM consult      Choice offered to / List presented to:             Status of service:  Completed, signed off Medicare Important Message given?   (If response is "NO", the following Medicare IM given date fields will be blank) Date Medicare IM given:   Date Additional Medicare IM given:    Discharge Disposition:  HOME/SELF CARE  Per UR Regulation:  Reviewed for med. necessity/level of care/duration of stay  If discussed at [place] of Stay Meetings, dates discussed:    Comments:  PCP Jenna Luo  01/11/12 Spoke with patient about d/c needs, he is set up with home O2 through [facility] and has a nebulizer. He ambulated independentlly prior to admission and lives with his wife.Has never needed HHC. No d/c needs identified, will continue to follow. Fuller Plan RN, BSN, CCM

## 2012-01-12 DIAGNOSIS — K219 Gastro-esophageal reflux disease without esophagitis: Secondary | ICD-10-CM

## 2012-01-12 DIAGNOSIS — J309 Allergic rhinitis, unspecified: Secondary | ICD-10-CM

## 2012-01-12 DIAGNOSIS — J438 Other emphysema: Secondary | ICD-10-CM

## 2012-01-12 DIAGNOSIS — J9600 Acute respiratory failure, unspecified whether with hypoxia or hypercapnia: Secondary | ICD-10-CM

## 2012-01-12 MED ORDER — METHYLPREDNISOLONE SODIUM SUCC 40 MG IJ SOLR
40.0000 mg | Freq: Two times a day (BID) | INTRAMUSCULAR | Status: DC
  Administered 2012-01-12: 40 mg via INTRAVENOUS
  Filled 2012-01-12 (×3): qty 1

## 2012-01-12 NOTE — Progress Notes (Signed)
Patient ID: Bruce Travis  male  ESP:[id number]    DOB: 1934/11/02    DOA: 01/10/2012  PCP: Odette Fraction, MD, MD  Subjective: Feels improving, afebrile, no acute events overnight.  Objective: Weight change:   Intake/Output Summary (Last 24 hours) at 01/12/12 1402 Last data filed at 01/12/12 1219  Gross per 24 hour  Intake    723 ml  Output      0 ml  Net    723 ml   Blood pressure 122/74, pulse 61, temperature 97.9 F (36.6 C), temperature source Oral, resp. rate 18, height 5' 7"  (1.702 m), weight 61.9 kg (136 lb 7.4 oz), SpO2 92.00%.  Physical Exam: General: Alert and awake, oriented x3, not in any acute distress. HEENT: anicteric sclera, pupils reactive to light and accommodation, EOMI CVS: S1-S2 clear, no murmur rubs or gallops Chest: Poor inspiration effort, no wheezing, rales or rhonchi Abdomen: soft nontender, nondistended, normal bowel sounds, no organomegaly Extremities: no cyanosis, clubbing or edema noted bilaterally Neuro: Cranial nerves II-XII intact, no focal neurological deficits  Lab Results: Basic Metabolic Panel:  Lab 63/33/54 1705  NA 137  K 4.4  CL 102  CO2 23  GLUCOSE 118*  BUN 24*  CREATININE 1.20  CALCIUM 9.5  MG --  PHOS --  CBC:  Lab 01/10/12 1705  WBC 6.8  NEUTROABS 5.8  HGB 17.2*  HCT 50.8  MCV 99.4  PLT 191     Micro Results: No results found for this or any previous visit (from the past 240 hour(s)).  Studies/Results:  Dg Chest 2 View 01/10/2012   IMPRESSION:  1.  Chronic emphysematous changes and pulmonary scarring. 2.  No definite acute overlying pulmonary process.   Medications: Scheduled Meds:   . ipratropium  0.5 mg Nebulization Q6H   And  . albuterol  2.5 mg Nebulization Q6H  . aspirin EC  81 mg Oral Daily  . atorvastatin  40 mg Oral QHS  . enoxaparin  40 mg Subcutaneous Q24H  . famotidine  20 mg Oral QHS  . irbesartan  75 mg Oral Daily  . levofloxacin (LEVAQUIN) IV  500 mg Intravenous Q24H  .  methylPREDNISolone (SOLU-MEDROL) injection  60 mg Intravenous Q12H  . montelukast  10 mg Oral QHS  . pantoprazole  40 mg Oral Q breakfast  . sodium chloride  3 mL Intravenous Q12H     Assessment/Plan: Principal Problem:  *Acute respiratory failure: Resolved, likely secondary to COPD exacerbation  Active Problems:  COPD EXACERBATION: - Improving, taper IV steroids, will transition to PO prednisone tomorrow - Continue antibiotics, nebulizer treatments, supplemental oxygen   GERD: Continue PPI   Allergic rhinitis: Stable   Hypertension: Stable  DVT Prophylaxis: Lovenox  Code Status: DO NOT RESUSCITATE  Disposition: Hopefully DC tomorrow   LOS: 2 days   Ogechi Kuehnel M.D. [facility] 01/12/2012, 2:02 PM Pager: [phone]

## 2012-01-13 DIAGNOSIS — K219 Gastro-esophageal reflux disease without esophagitis: Secondary | ICD-10-CM

## 2012-01-13 DIAGNOSIS — J309 Allergic rhinitis, unspecified: Secondary | ICD-10-CM

## 2012-01-13 DIAGNOSIS — J9600 Acute respiratory failure, unspecified whether with hypoxia or hypercapnia: Secondary | ICD-10-CM

## 2012-01-13 DIAGNOSIS — J438 Other emphysema: Secondary | ICD-10-CM

## 2012-01-13 MED ORDER — PREDNISONE 10 MG PO TABS
60.0000 mg | ORAL_TABLET | Freq: Once | ORAL | Status: AC
  Administered 2012-01-13: 60 mg via ORAL
  Filled 2012-01-13: qty 1

## 2012-01-13 MED ORDER — LEVOFLOXACIN 500 MG PO TABS
500.0000 mg | ORAL_TABLET | Freq: Every day | ORAL | Status: DC
  Filled 2012-01-13: qty 1

## 2012-01-13 MED ORDER — IPRATROPIUM-ALBUTEROL 18-103 MCG/ACT IN AERO
2.0000 | INHALATION_SPRAY | Freq: Four times a day (QID) | RESPIRATORY_TRACT | Status: DC
  Administered 2012-01-13: 2 via RESPIRATORY_TRACT
  Filled 2012-01-13: qty 14.7

## 2012-01-13 MED ORDER — PREDNISONE (PAK) 10 MG PO TABS: ORAL_TABLET | ORAL | Status: DC

## 2012-01-13 MED ORDER — LEVOFLOXACIN 500 MG PO TABS: 500.0000 mg | ORAL_TABLET | Freq: Every day | ORAL | Status: AC

## 2012-01-13 MED ORDER — ALBUTEROL SULFATE (5 MG/ML) 0.5% IN NEBU: 2.5000 mg | INHALATION_SOLUTION | Freq: Three times a day (TID) | RESPIRATORY_TRACT | Status: DC

## 2012-01-13 NOTE — Discharge Summary (Signed)
Physician Discharge Summary  Patient ID: Bruce Travis MRN: [id number] DOB/AGE: 01/11/1935 77 y.o.  Admit date: 01/10/2012 Discharge date: 01/13/2012  Primary Care Physician:  Odette Fraction, MD, MD  Discharge Diagnoses:    .COPD exacerbation .GERD . Acute on chronic respiratory failure . Allergic rhinitis . Hypertension  Consults: None  Discharge Medications: Medication List  As of 01/13/2012  9:03 AM   TAKE these medications         albuterol 108 (90 BASE) MCG/ACT inhaler   Commonly known as: PROVENTIL HFA;VENTOLIN HFA   Inhale 2 puffs into the lungs every 4 (four) hours as needed. For shortness of breath      albuterol (5 MG/ML) 0.5% nebulizer solution   Commonly known as: PROVENTIL   Take 0.5 mLs (2.5 mg total) by nebulization 3 (three) times daily.      aspirin 81 MG tablet   Take 81 mg by mouth daily.      atorvastatin 40 MG tablet   Commonly known as: LIPITOR   Take 40 mg by mouth at bedtime.      budesonide-formoterol 160-4.5 MCG/ACT inhaler   Commonly known as: SYMBICORT   Inhale 2 puffs into the lungs 2 (two) times daily.      DIOVAN 80 MG tablet   Generic drug: valsartan   Take 80 mg by mouth daily. 1 by mouth daily      famotidine 20 MG tablet   Commonly known as: PEPCID   Take 20 mg by mouth at bedtime.      HYDROcodone-homatropine 5-1.5 MG/5ML syrup   Commonly known as: HYCODAN   Take 5-10 mLs by mouth every 4 (four) hours as needed. For cough      ipratropium 0.02 % nebulizer solution   Commonly known as: ATROVENT   Take 500 mcg by nebulization 4 (four) times daily as needed. For shortness of breat      lansoprazole 15 MG capsule   Commonly known as: PREVACID   Take 30 mg by mouth daily.      levofloxacin 500 MG tablet   Commonly known as: LEVAQUIN   Take 1 tablet (500 mg total) by mouth daily.      montelukast 10 MG tablet   Commonly known as: SINGULAIR   Take 10 mg by mouth at bedtime.      predniSONE 10 MG tablet   Commonly known  as: STERAPRED UNI-PAK   Prednisone dosing: Take  Prednisone 2m (4 tabs) x 3 days, then taper to 319m(3 tabs) x 3 days, then 2023m2 tabs) x 3days, then 64m53m tab) x 3days, then OFF.    Dispense:  30 tabs, refills: None      tiotropium 18 MCG inhalation capsule   Commonly known as: SPIRIVA   Place 18 mcg into inhaler and inhale daily.             Brief H and P: For complete details please refer to admission H and P, but in brief, 77 y27r old man, former smoker with history of COPD was referred to the emergency department by his primary care physician after followup visit in the office today. Patient developed shortness of breath and cough 8 days ago which has progressively worsened. He saw his primary care physician in the office 3 days ago and was started on prednisone 60 mg daily and breathing treatments. Despite these interventions the patient failed to improve. He reports his last round of antibiotics was approximately 3 weeks ago. In March and April  he was treated with 2 rounds of antibiotics for a sinus infection.  In the emergency department patient was noted to be afebrile with a respiratory rate 19-24, pulse 80 and oxygen saturation 88% on room air, improved to 93% on 2 L. Point-of-care troponin and BNP were unremarkable. Chemistry panel and CBC unremarkable. Chest x-ray negative. He was given albuterol nebulizer the emergency department.    Hospital Course:   *Acute respiratory failure, COPD EXACERBATION: Resolved, likely secondary to COPD exacerbation. Patient was placed on IV steroids which were transitioned to PO prednisone at the time of DC. Patient was placed on IV antibiotics and were transitioned to by mouth Levaquin. Patient was recommended to continue nebulizer treatments and supplemental oxygen at home. He will follow up with his PCP within next 7-10 days for hospital follow. The patient [facility] with [facility]bulizers and is independent  with his ADLs.  GERD: Patient was continued on PPI   Allergic rhinitis: Stable   Hypertension: Remained stable   Day of Discharge BP 108/60  Pulse 66  Temp(Src) 97.3 F (36.3 C) (Oral)  Resp 19  Ht 5' 7"  (1.702 m)  Wt 61.9 kg (136 lb 7.4 oz)  BMI 21.37 kg/m2  SpO2 91%  Physical Exam: General: Alert and awake oriented x3 not in any acute distress. HEENT: anicteric sclera, pupils reactive to light and accommodation CVS: S1-S2 clear no murmur rubs or gallops Chest: clear to auscultation bilaterally, no wheezing rales or rhonchi Abdomen: soft nontender, nondistended, normal bowel sounds, no organomegaly Extremities: no cyanosis, clubbing or edema noted bilaterally Neuro: Cranial nerves II-XII intact, no focal neurological deficits   The results of significant diagnostics from this hospitalization (including imaging, microbiology, ancillary and laboratory) are listed below for reference.    LAB RESULTS: Basic Metabolic Panel:  Lab 32/99/24 1705  NA 137  K 4.4  CL 102  CO2 23  GLUCOSE 118*  BUN 24*  CREATININE 1.20  CALCIUM 9.5  MG --  PHOS --   CBC:  Lab 01/10/12 1705  WBC 6.8  NEUTROABS 5.8  HGB 17.2*  HCT 50.8  MCV 99.4  PLT 191    Significant Diagnostic Studies:  Dg Chest 2 View  01/10/2012  *RADIOLOGY REPORT*  Clinical Data: Shortness of breath.  CHEST - 2 VIEW  Comparison: 08/11/2011.  Findings: The cardiac silhouette, mediastinal and hilar contours are stable.  The lungs demonstrate chronic scarring changes and calcified granulomas.  Underlying emphysematous changes are again noted.  No definite acute overlying pulmonary process.  IMPRESSION:  1.  Chronic emphysematous changes and pulmonary scarring. 2.  No definite acute overlying pulmonary process.  Original Report Authenticated By: P. Kalman Jewels, M.D.     Disposition and Follow-up: Discharge Orders    Future Orders Please Complete By Expires   Diet - low sodium heart healthy      Increase  activity slowly          DISPOSITION: Home  DIET: Heart healthy diet  ACTIVITY: As tolerated   DISCHARGE FOLLOW-UP Follow-up Information    Follow up with [facility], MD. Schedule an appointment as soon as possible for a visit in 10 days. (for hospital follow-up)    Contact information:   [address] [place] [phone]          Time spent on Discharge: 45 minutes  Signed:  Halynn Reitano M.D. [facility] 01/13/2012, 9:03 AM

## 2012-03-02 ENCOUNTER — Telehealth: Payer: Self-pay | Admitting: Internal Medicine

## 2012-03-02 ENCOUNTER — Telehealth: Payer: Self-pay | Admitting: *Deleted

## 2012-03-02 DIAGNOSIS — R918 Other nonspecific abnormal finding of lung field: Secondary | ICD-10-CM

## 2012-03-02 NOTE — Telephone Encounter (Signed)
Pt was calling to clarify why chest ct was being done and verbalized understanding that this is being done to follow-up on his lung nodules. Pt knows that the PCc's will call him with the appt date and time and then he can schedule for follow-up with MW.

## 2012-03-02 NOTE — Telephone Encounter (Signed)
Message copied by Rosana Berger on Thu Mar 02, 2012  4:34 PM ------      Message from: Christinia Gully B      Created: Tue Nov 09, 2011 10:07 AM       Set up non contrasted chest ct then ov      Fu nodules

## 2012-03-02 NOTE — Telephone Encounter (Signed)
Spoke with pt and notified that he is due to have CT Chest and her verbalized understanding. Order was sent to [facility].

## 2012-03-03 ENCOUNTER — Other Ambulatory Visit: Payer: Self-pay | Admitting: Internal Medicine

## 2012-03-07 ENCOUNTER — Ambulatory Visit (INDEPENDENT_AMBULATORY_CARE_PROVIDER_SITE_OTHER): Payer: Medicare Other | Admitting: Internal Medicine

## 2012-03-07 ENCOUNTER — Ambulatory Visit (INDEPENDENT_AMBULATORY_CARE_PROVIDER_SITE_OTHER)
Admission: RE | Admit: 2012-03-07 | Discharge: 2012-03-07 | Disposition: A | Discharge status: Home / Self Care | Payer: Medicare Other | Source: Ambulatory Visit | Attending: Internal Medicine | Admitting: Internal Medicine

## 2012-03-07 ENCOUNTER — Encounter: Payer: Self-pay | Admitting: Internal Medicine

## 2012-03-07 VITALS — BP 128/82 | HR 67 | Temp 97.5°F | Ht 67.0 in | Wt 139.6 lb

## 2012-03-07 DIAGNOSIS — R918 Other nonspecific abnormal finding of lung field: Secondary | ICD-10-CM

## 2012-03-07 DIAGNOSIS — J984 Other disorders of lung: Secondary | ICD-10-CM

## 2012-03-07 DIAGNOSIS — J449 Chronic obstructive pulmonary disease, unspecified: Secondary | ICD-10-CM

## 2012-03-07 DIAGNOSIS — J4489 Other specified chronic obstructive pulmonary disease: Secondary | ICD-10-CM

## 2012-03-07 NOTE — Progress Notes (Signed)
Subjective:    Patient ID: Bruce Travis, male    DOB: 1935/06/05    MRN: [id number]  Brief patient profile:   17 yowm with a history of COPD, remote laryngeal cancer with some dysphagia, allergic rhinitis, and esophageal reflux. He has also been followed for chronic nodular granulomatous disease and bronchiectasis on CT scan chest, stable on CT for 2 + yrs (2009) .  ROV 04/03/10 -- returns for f/u COPD, allergic rhinitis, GERD, hx laryngeal CA. Recent treatment for acute sinusitis with Augmentin, also added atrovent NS to his other allergy regimen. He feels better, HA improved, mucous is now clear. Breathing has been good. Recently started on doxycycline to treat a dermatitis.   ROV 07/22/10 -- COPD, sinus disease, allergies. Old granulomatous disease. Taking clarinex, nasonex, mucinex DM. Quit atrovent nose spray. Taking Advair two times a day, rare ProAir use. No exacerbations since last visit. Doesn't rinse mouth out after the Advair, has thrush.   08/19/10-Presents for post hospital visit. Admitted 08/04/2010- 08/07/2010 for COPD exacerbation and PNA -bilateral lower lobe PNA. Tx w/ Rocephin and Zithromax. along w/ steroid taper and nebs. Discharged on [place]. Returns today w/ persistent cough with thick green mucus and dyspnea.   ROV 09/08/10 -- follow up for COPD, chronic rhinitis, allergies, chronic sinusitis, old granulomatous disease.   ROV 10/26/10 -- COPD, cough/rhinitis, sinusitis. Started atrovent nasal spray three times a day, it helped with nasal drainage. We also stopped Advair to see if it would help cough, throat. He wants to go back better.   7/20 /2012 Acute OV  Pt presents for an acute work in visit. Complains of increased cough with clear to yellow mucus.   Says he feels "gurgling" in his chest when he lays down at night. Says cough never goes completely away. Aggravating him all the times. OTC cough products do work.  More wheezing last few days. Last CT chest 2010 showed stable  granulomatous dz. Stable nodules, he is due for follow up CT scan to follow nodules. No weight loss, no hemoptysis. >>Doxycycline x 7 days , rx spiriva, and advair stopped CT chest   04/02/2011 Follow up NP Pt returns for follow up . Last visit with COPD flare tx w/ doxycyline x 7 days . He says he is feeling better but cough has not went away.  Still having chest congestion with beige mucus, hoarseness. We stopped Advair and started Spiriva.  He has noticed some  difficulty urinating and having to strain-says it is mild and not to bothersome. No dysuria or discharge. No back pain.  Most aggravating symptom is persistent cough, esp at night. No fever or chest pain. He does have a hx of throat cancer in past >20 years ago. Followed by ENT Dr. [name] in past. Says he has intermittent "aspiration " with certain foods, He has changed his diet which has helped. He has had recurrent PNA in past .   Last visit. CT chest was done for lung nodule surveillance, which showed Severe centrilobular emphysema with areas of new and persistent  nodularity given the multiplicity of findings, these could represent areas of progressive scarring. One more underlying primary lung neoplasm(s) versus an unusual appearance of pulmonary metastasis cannot be excluded. If the the patient is a potential surgery or treatment candidate, PET should be considered for further evaluation. If this is not performed, short-term follow-up chest CT at 3 months would be recommended. 2. Lower lobe findings which may represent infection or aspiration. He has an  ov in 1 week to discuss with Dr. Melvyn Novas  .  rec We are setting you up for a Modified Barium Swallow to evaluate swallowing.  Would like for you to see Dr. [name] for your hoarseness/cough  Prednisone taper over next week.  Add Pepcid 48m At bedtime   Mucinex DM Twice daily  As needed  Cough Begin Spiriva Handihaler 1 puff daily , rinse after use.  Hydromet 1-2 tsp every 4-6 hr As needed   Cough  04/08/2011 ov/Bruce Travis cc cough much better, sob only with heavy ex, rare need for albuterol. Finishing up prednisone.  No longer on fish oil.  W/u by NLucia Gaskinsand ST in progress. rec Try taking prevacid 30 mg x 378mefore bfast daily  Stop spiriva for now (it's like high octane fuel for breathing - if you can do more activity with spiriva daily  and less need for albuterol rescue spray then restart spiriva- if not, leave it off  Please schedule a follow up office visit in 4 weeks, sooner if needed with cxr and pft's  06/08/2011 f/u ov/Bruce Travis cough gone but cc doe worse in cold damp weather, better p albuterol and no change off spiriva / advair. No sign cough/ excess mucus day or night. >>no changes   09/09/2011 Np post hosp f/u p admitted last month for influenza A and AECOPD , tx with tamiflu and steroids and nebs.  Is improving . Cough and dyspnea are back to his baseline.  CXR on 12/17 with chronic changes only.  rec No change   11/08/2011 Bruce Travis/ f/u ov on maint rx with symbicort and spiriva c/o nasal symptoms worse but  breathing fine, no limits as long as paces himself and no cough.  Main nasal issue is congestion/itching/sneezing worse x 2 weeks with purulent bloody secretions s HA/toothache  But ahs noted  year round nasal symptoms and worse x 2 years which do not correlate with breathing status Stopped nasonex due to insurance > no worse off it> has not f/u with NeLucia Gaskinset for ent re-eval No daytime saba need rec Augmentin 875 mg twice daily x 10days Prednisone 10 mg take  4 each am x 2 days,   2 each am x 2 days,  1 each am x2days and stop See Dr NeLucia Gaskinsn 2 weeks to let him direct you further on what to do about your chronic sinus symptoms F/u prn  03/07/2012 f/u ov/Bruce Travis cc f/u mpn with doe x heavy yardwork but rarely the proaire and never the neb.  No unusual cough, purulent sputum or sinus/hb symptoms on present rx.    Sleeping ok without nocturnal  or early am exacerbation  of  respiratory  c/o's or need for noct saba. Also denies any obvious fluctuation of symptoms with weather or environmental changes or other aggravating or alleviating factors except as outlined above   ROS  The following are not active complaints unless bolded sore throat, dysphagia, dental problems, itching, sneezing,  nasal congestion or excess/ purulent secretions, ear ache,   fever, chills, sweats, unintended wt loss, pleuritic or exertional cp, hemoptysis,  orthopnea pnd or leg swelling, presyncope, palpitations, heartburn, abdominal pain, anorexia, nausea, vomiting, diarrhea  or change in bowel or urinary habits, change in stools or urine, dysuria,hematuria,  rash, arthralgias, visual complaints, headache, numbness weakness or ataxia or problems with walking or coordination,  change in mood/affect or memory.  Objective:   Physical Exam  Hoarse amb wm nad Wt  140 04/08/2011 > 06/08/2011 141 >136 09/09/2011 > 141 11/08/2011 > 03/07/2012  139 HEENT  Mod turbinate edema.  Edentulous/ Oropharynx no thrush or excess pnd or cobblestoning.  No JVD or cervical adenopathy. Mild accessory muscle hypertrophy. Trachea midline, nl thryroid. Chest was hyperinflated by percussion with diminished breath sounds  Regular rate and rhythm without murmur gallop or rub or increase P2 or edema.  Abd: no hsm, nl excursion - pos Hoover at mid insp. Ext warm without cyanosis or clubbing.       CXR  06/08/2011 : COPD and calcified granulomata. Stable compared to prior exam.  CT  03/07/2012 : 1. The vast majority of the previously noted pulmonary nodules are unchanged in size, number and distribution (and the overall appearance of the lungs could suggest sequelae of a pneumoconiosis or simply sequelae of old granulomatous disease), with exception of the right lower lobe pulmonary nodules as mentioned above. The largest of these new nodules is 7 mm and has spiculated margins and some  surrounding architectural distortion.         Assessment & Plan:

## 2012-03-07 NOTE — Assessment & Plan Note (Signed)
-   PFT's 05/1611  FEV1  1.71 (67%) ratio 37 and no better p B2 with DLC0 54%   - HFA 75% p coaching 06/08/11  GOLD II but clinically severe and asso with low dlco   Adequate control on present rx, reviewed

## 2012-03-07 NOTE — Assessment & Plan Note (Addendum)
>  CT 2010 no changes  >CT chest 03/12/2011 >>Severe centrilobular emphysema with areas of new and persistent  nodularity  A new somewhat spiculated opacity within the left upper lobe  measures 1.6 x 2.5 cm on transverse image 13 of series 6 > CT chest 03/07/2012 1. The vast majority of the previously noted pulmonary nodules are unchanged in size, number and distribution (and the overall appearance of the lungs could suggest sequelae of a pneumoconiosis or simply sequelae of old granulomatous disease), with exception of the right lower lobe pulmonary nodules as mentioned above. The largest of these new nodules is 7 mm and has spiculated margins and some surrounding architectural distortion.  I had an extended discussion with the patient today lasting 15 to 20 minutes of a 25 minute visit on the following issues:   Although there are clearly abnormalities on CT scan, they should probably be considered "microscopic" since not obvious on plain cxr .     In the setting of obvious "macroscopic" health issues,  I am very reluctatnt to embark on an invasive w/u at this point especially given the multiple nodules and assoc with bronchiectasis, which is much more suggestive of low grade MAI than met ca, though this would need tissue dx to sort this out and this would be prohibitively risky given the amount of bullous dz present > so follow with plain cxr q 6 months is adequate in the absence of new sympoms

## 2012-03-07 NOTE — Patient Instructions (Addendum)
No change in your medications - if start needing the nebulizer we need to see you in pulmonary clinic as soon as possible.  Please schedule a follow up visit in 6 months but call sooner if needed with cxr on return

## 2012-03-09 ENCOUNTER — Telehealth: Payer: Self-pay | Admitting: Internal Medicine

## 2012-03-09 NOTE — Telephone Encounter (Signed)
Attempted to call patients home and cell numbers, no answer.  [id number]   Note: According to result note per Dr Melvyn Novas, results were discussed at [place].

## 2012-03-09 NOTE — Telephone Encounter (Signed)
Pt returned call. Call his Cell # Mariann Laster

## 2012-03-10 NOTE — Telephone Encounter (Signed)
Called, spoke with pt who states Dr. Melvyn Novas advised during [place] that as far as he could see there have been no changes to CT Chest but it hadn't been read by the radiologist yet.  Pt would like to know if the radiologist has read scan yet -- if so, would like to know if there are still know changes per their report.  He is aware MW is out of office until Monday.  Dr. Melvyn Novas, pls advise.  Thank you.

## 2012-03-13 NOTE — Telephone Encounter (Signed)
Discussed with pt, MPN and very poor surgical candidate so f/u at 6 months is appropriate

## 2012-03-14 ENCOUNTER — Telehealth: Payer: Self-pay | Admitting: Internal Medicine

## 2012-03-14 NOTE — Telephone Encounter (Signed)
I spoke with Dr. Gena Fray and he is requesting to speak with Dr. Melvyn Novas personally regarding pt's CT results. States pt was confused by the results. The best # to reach Dr. Gena Fray is [phone]. He would like a call when MW finished clinic today. Please advise Dr. Melvyn Novas, thanks

## 2012-04-19 ENCOUNTER — Encounter: Payer: Self-pay | Admitting: Gastroenterology

## 2012-05-02 ENCOUNTER — Other Ambulatory Visit: Payer: Self-pay | Admitting: Internal Medicine

## 2012-05-02 MED ORDER — MUCINEX DM 30-600 MG PO TB12: 1.0000 | ORAL_TABLET | Freq: Two times a day (BID) | ORAL | Status: DC | PRN

## 2012-05-02 NOTE — Telephone Encounter (Signed)
Received refill request from [facility] for mucinex dm BID.  Pt last seen by MW 7.16.13, follow up in 6 months.  Medication removed from med list with comment that "pt has not used in the past 30 days."  Med re-added to med list.  Refill sent, MAR updated.

## 2012-05-08 ENCOUNTER — Encounter: Payer: Self-pay | Admitting: Gastroenterology

## 2012-05-31 ENCOUNTER — Encounter: Payer: Self-pay | Admitting: *Deleted

## 2012-05-31 NOTE — Progress Notes (Signed)
Patient ID: Bruce Travis, male   DOB: 1935/04/04, 77 y.o.   MRN: [id number] Pt in PV today for recall colonoscopy.  Pt has COPD and uses oxygen at night.  Per anesthesia guidelines: pt on 02 cannot have procedure at [facility]. Pt also wants to discuss with Dr. Sharlett Iles whether he needs another colonoscopy because of his age.  Office visit scheduled with Dr. Sharlett Iles and colonoscopy scheduled for 10/23 cancelled.

## 2012-06-02 ENCOUNTER — Other Ambulatory Visit: Payer: Self-pay | Admitting: *Deleted

## 2012-06-02 MED ORDER — MUCINEX DM 30-600 MG PO TB12: 1.0000 | ORAL_TABLET | Freq: Two times a day (BID) | ORAL | Status: DC | PRN

## 2012-06-05 ENCOUNTER — Other Ambulatory Visit: Payer: Self-pay | Admitting: Adult Health

## 2012-06-14 ENCOUNTER — Encounter: Payer: Medicare Other | Admitting: Gastroenterology

## 2012-06-22 ENCOUNTER — Encounter: Payer: Self-pay | Admitting: Gastroenterology

## 2012-06-22 ENCOUNTER — Ambulatory Visit (INDEPENDENT_AMBULATORY_CARE_PROVIDER_SITE_OTHER): Payer: Medicare Other | Admitting: Gastroenterology

## 2012-06-22 ENCOUNTER — Other Ambulatory Visit (INDEPENDENT_AMBULATORY_CARE_PROVIDER_SITE_OTHER): Payer: Medicare Other

## 2012-06-22 VITALS — BP 144/60 | HR 68 | Ht 67.0 in | Wt 139.4 lb

## 2012-06-22 DIAGNOSIS — K219 Gastro-esophageal reflux disease without esophagitis: Secondary | ICD-10-CM

## 2012-06-22 DIAGNOSIS — R7989 Other specified abnormal findings of blood chemistry: Secondary | ICD-10-CM

## 2012-06-22 DIAGNOSIS — R6889 Other general symptoms and signs: Secondary | ICD-10-CM

## 2012-06-22 DIAGNOSIS — R2689 Other abnormalities of gait and mobility: Secondary | ICD-10-CM

## 2012-06-22 DIAGNOSIS — K900 Celiac disease: Secondary | ICD-10-CM

## 2012-06-22 DIAGNOSIS — J441 Chronic obstructive pulmonary disease with (acute) exacerbation: Secondary | ICD-10-CM | POA: Insufficient documentation

## 2012-06-22 DIAGNOSIS — K9041 Non-celiac gluten sensitivity: Secondary | ICD-10-CM

## 2012-06-22 DIAGNOSIS — K589 Irritable bowel syndrome without diarrhea: Secondary | ICD-10-CM

## 2012-06-22 DIAGNOSIS — R799 Abnormal finding of blood chemistry, unspecified: Secondary | ICD-10-CM

## 2012-06-22 DIAGNOSIS — R29818 Other symptoms and signs involving the nervous system: Secondary | ICD-10-CM

## 2012-06-22 DIAGNOSIS — R52 Pain, unspecified: Secondary | ICD-10-CM

## 2012-06-22 LAB — BASIC METABOLIC PANEL
CO2: 28 mEq/L (ref 19–32)
Chloride: 110 mEq/L (ref 96–112)
Sodium: 143 mEq/L (ref 135–145)

## 2012-06-22 LAB — HEPATIC FUNCTION PANEL
ALT: 28 U/L (ref 0–53)
Alkaline Phosphatase: 63 U/L (ref 39–117)
Bilirubin, Direct: 0.2 mg/dL (ref 0.0–0.3)
Total Bilirubin: 1 mg/dL (ref 0.3–1.2)
Total Protein: 6.3 g/dL (ref 6.0–8.3)

## 2012-06-22 LAB — CBC WITH DIFFERENTIAL/PLATELET
Basophils Absolute: 0 10*3/uL (ref 0.0–0.1)
Basophils Relative: 0.5 % (ref 0.0–3.0)
Eosinophils Absolute: 0.2 10*3/uL (ref 0.0–0.7)
Hemoglobin: 17.4 g/dL — ABNORMAL HIGH (ref 13.0–17.0)
Lymphocytes Relative: 24.7 % (ref 12.0–46.0)
MCHC: 33.2 g/dL (ref 30.0–36.0)
MCV: 99.7 fl (ref 78.0–100.0)
Monocytes Absolute: 0.5 10*3/uL (ref 0.1–1.0)
Neutro Abs: 3.4 10*3/uL (ref 1.4–7.7)
RBC: 5.24 Mil/uL (ref 4.22–5.81)
RDW: 12.8 % (ref 11.5–14.6)

## 2012-06-22 LAB — FOLATE: Folate: 7.6 ng/mL (ref >5.9)

## 2012-06-22 LAB — IBC PANEL: Transferrin: 208.1 mg/dL — ABNORMAL LOW (ref 212.0–360.0)

## 2012-06-22 LAB — VITAMIN B12: Vitamin B-12: 301 pg/mL (ref 211–911)

## 2012-06-22 MED ORDER — LANSOPRAZOLE 15 MG PO CPDR: DELAYED_RELEASE_CAPSULE | ORAL | Status: DC

## 2012-06-22 NOTE — Patient Instructions (Addendum)
Your physician has requested that you go to the basement for lab work before leaving today.  We have sent the following medications to your pharmacy for you to pick up at your convenience: Prevacid.

## 2012-06-22 NOTE — Progress Notes (Signed)
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 He has home oxygen dependency and shortness of breath with exertion. He's had previous extensive GI evaluations which have confirmed acid reflux, and he is doing well on daily PPI therapy. He also had bowel irregularity, gas and bloating, this has improved tremendously on gluten-free diet. Currently is having regular bowel movements without melena or hematochezia. Review of his labs from year ago appear unremarkable with normal hemoglobin. He denies any hepatobiliary complaints, dyspepsia, dysphagia, nausea vomiting, anorexia or weight loss. Family history is noncontributory terms of colon cancer.  Current Medications, Allergies, Past Medical History, Past Surgical History, Family History and Social History were reviewed in [employer] record.  Pertinent Review of Systems Negative   Physical Exam: Healthy patient without shortness of breath at rest. Blood pressure 144 was 60, pulse 60 and regular and weight 139 the BMI of 21.83. I cannot appreciate stigmata of chronic liver disease thyromegaly or lymphadenopathy. Chest shows diminished breath sounds in both lung fields with scattered rhonchi. She appear to be irregular rhythm without murmurs gallops rubs. Abdominal exam shows no organomegaly, masses or tenderness. Bowel sounds are normal. Peripheral tremor showed no edema, phlebitis, or swollen joints. Mental status is normal    Assessment and Plan: This patient is doing extremely well from a GI standpoint on gluten-free diet with daily PPI therapy for acid reflux. He is up-to-date on his endoscopy and colonoscopy exams, and in any case is an extremely poor candidate for conscious sedation with his severe COPD which is managed by Dr.[name] in our pulmonary division. I have renewed this patient's medications, reviewed a reflux regime with him, and we'll have him send in IFOB stool  cards for occult blood.   she's been advised to continue his other medications as listed. I did order CBC, metabolic profile, and anemia profile, and celiac serologies for review.  Encounter Diagnoses  Name Primary?  . Esophageal reflux Yes  . IBS (irritable bowel syndrome)

## 2012-06-23 LAB — CELIAC PANEL 10
Endomysial Screen: NEGATIVE
Gliadin IgG: 7.4 U/mL (ref <20)
IgA: 215 mg/dL (ref 68–379)
Tissue Transglutaminase Ab, IgA: 4.8 U/mL (ref <20)

## 2012-06-26 ENCOUNTER — Telehealth: Payer: Self-pay | Admitting: Gastroenterology

## 2012-06-26 NOTE — Telephone Encounter (Signed)
Pt called again about lab results and he just wanted clarification about what I told him. We discussed the results again and he stated understanding; mailed him copies of the results.

## 2012-06-27 ENCOUNTER — Other Ambulatory Visit (INDEPENDENT_AMBULATORY_CARE_PROVIDER_SITE_OTHER): Payer: Medicare Other

## 2012-06-27 DIAGNOSIS — K219 Gastro-esophageal reflux disease without esophagitis: Secondary | ICD-10-CM

## 2012-06-27 DIAGNOSIS — K589 Irritable bowel syndrome without diarrhea: Secondary | ICD-10-CM

## 2012-06-27 DIAGNOSIS — R6889 Other general symptoms and signs: Secondary | ICD-10-CM

## 2012-06-27 DIAGNOSIS — K9041 Non-celiac gluten sensitivity: Secondary | ICD-10-CM

## 2012-06-27 DIAGNOSIS — R29818 Other symptoms and signs involving the nervous system: Secondary | ICD-10-CM

## 2012-06-27 DIAGNOSIS — R7989 Other specified abnormal findings of blood chemistry: Secondary | ICD-10-CM

## 2012-06-27 DIAGNOSIS — R52 Pain, unspecified: Secondary | ICD-10-CM

## 2012-06-27 DIAGNOSIS — K900 Celiac disease: Secondary | ICD-10-CM

## 2012-06-27 DIAGNOSIS — R799 Abnormal finding of blood chemistry, unspecified: Secondary | ICD-10-CM

## 2012-06-27 DIAGNOSIS — R2689 Other abnormalities of gait and mobility: Secondary | ICD-10-CM

## 2012-06-30 ENCOUNTER — Telehealth: Payer: Self-pay | Admitting: Gastroenterology

## 2012-06-30 NOTE — Telephone Encounter (Signed)
Informed pt his IFOB results were negative for blood, so if he is having no GI issues, he is good for 1 year. Pt stated understanding.

## 2012-08-09 ENCOUNTER — Other Ambulatory Visit: Payer: Self-pay | Admitting: Dermatology

## 2012-08-22 ENCOUNTER — Telehealth: Payer: Self-pay | Admitting: Internal Medicine

## 2012-08-22 MED ORDER — AMOXICILLIN-POT CLAVULANATE 875-125 MG PO TABS: 1.0000 | ORAL_TABLET | Freq: Two times a day (BID) | ORAL | Status: DC

## 2012-08-22 NOTE — Telephone Encounter (Signed)
Per SN---call in augmentin 875 MG  #14  1 po bid., mucinex 600 mg  2 po bid and increase fluids.  thanks

## 2012-08-22 NOTE — Telephone Encounter (Signed)
Spoke with pt He states feels like has developed bronchitis He has prod cough with large amounts of thick, clear sputum, increased SOB and chest feels heavy No appts open today Will forward to doc of the day for recs SN, please advise, thanks! Allergies  Allergen Reactions  . Codeine Other (See Comments)    Thought head was going to blow off

## 2012-08-22 NOTE — Telephone Encounter (Signed)
Spoke with pt and notified of recs per SN He verbalized understanding and states nothing further needed Rx was sent to pharm

## 2012-09-07 ENCOUNTER — Encounter: Payer: Self-pay | Admitting: Internal Medicine

## 2012-09-07 ENCOUNTER — Ambulatory Visit (INDEPENDENT_AMBULATORY_CARE_PROVIDER_SITE_OTHER)
Admission: RE | Admit: 2012-09-07 | Discharge: 2012-09-07 | Disposition: A | Discharge status: Home / Self Care | Payer: Medicare Other | Source: Ambulatory Visit | Attending: Internal Medicine | Admitting: Internal Medicine

## 2012-09-07 ENCOUNTER — Ambulatory Visit (INDEPENDENT_AMBULATORY_CARE_PROVIDER_SITE_OTHER): Payer: Medicare Other | Admitting: Internal Medicine

## 2012-09-07 VITALS — BP 138/96 | HR 94 | Temp 97.9°F | Ht 67.0 in | Wt 141.2 lb

## 2012-09-07 DIAGNOSIS — J984 Other disorders of lung: Secondary | ICD-10-CM

## 2012-09-07 DIAGNOSIS — J449 Chronic obstructive pulmonary disease, unspecified: Secondary | ICD-10-CM

## 2012-09-07 NOTE — Assessment & Plan Note (Addendum)
-   PFT's 06/08/11  FEV1  1.71 (67%) ratio 37 and no better p B2 with DLC0 54%   - HFA 75% p coaching 06/08/11  GOLD II - good control of symptoms on  Present rx.    Each maintenance medication was reviewed in detail including most importantly the difference between maintenance and as needed and under what circumstances the prns are to be used.  Please see instructions for details which were reviewed in writing and the patient given a copy.

## 2012-09-07 NOTE — Progress Notes (Signed)
Subjective:    Patient ID: Bruce Travis, male    DOB: 1935-02-18    MRN: [id number]  Brief patient profile:   37 yowm   quit smoking 1990 with a history of COPD, remote laryngeal cancer with some dysphagia, allergic rhinitis, and esophageal reflux  HPI 7/20 /2012 Acute OV  Pt presents for an acute work in visit. Complains of increased cough with clear to yellow mucus.   Says he feels "gurgling" in his chest when he lays down at night. Says cough never goes completely away. Aggravating him all the times. OTC cough products do work.  More wheezing last few days. Last CT chest 2010 showed stable granulomatous dz. Stable nodules, he is due for follow up CT scan to follow nodules. No weight loss, no hemoptysis. >>Doxycycline x 7 days , rx spiriva, and advair stopped CT chest   04/02/2011 Follow up NP Pt returns for follow up . Last visit with COPD flare tx w/ doxycyline x 7 days . He says he is feeling better but cough has not went away.  Still having chest congestion with beige mucus, hoarseness. We stopped Advair and started Spiriva.  He has noticed some  difficulty urinating and having to strain-says it is mild and not to bothersome. No dysuria or discharge. No back pain.  Most aggravating symptom is persistent cough, esp at night. No fever or chest pain. He does have a hx of throat cancer in past >20 years ago. Followed by ENT Dr. [name] in past. Says he has intermittent "aspiration " with certain foods, He has changed his diet which has helped. He has had recurrent PNA in past .   Last visit. CT chest was done for lung nodule surveillance, which showed Severe centrilobular emphysema with areas of new and persistent  nodularity given the multiplicity of findings, these could represent areas of progressive scarring. One more underlying primary lung neoplasm(s) versus an unusual appearance of pulmonary metastasis cannot be excluded. If the the patient is a potential surgery or treatment candidate, PET  should be considered for further evaluation. If this is not performed, short-term follow-up chest CT at 3 months would be recommended. 2. Lower lobe findings which may represent infection or aspiration. He has an ov in 1 week to discuss with Dr. Melvyn Novas  .  rec We are setting you up for a Modified Barium Swallow to evaluate swallowing.  Would like for you to see Dr. [name] for your hoarseness/cough  Prednisone taper over next week.  Add Pepcid 74m At bedtime   Mucinex DM Twice daily  As needed  Cough Begin Spiriva Handihaler 1 puff daily , rinse after use.  Hydromet 1-2 tsp every 4-6 hr As needed  Cough  04/08/2011 ov/Bruce Travis cc cough much better, sob only with heavy ex, rare need for albuterol. Finishing up prednisone.  No longer on fish oil.  W/u by NLucia Gaskinsand ST in progress. rec Try taking prevacid 30 mg x 344mefore bfast daily Stop spiriva for now (it's like high octane fuel for breathing - if you can do more activity with spiriva daily  and less need for albuterol rescue spray then restart spiriva- if not, leave it off Please schedule a follow up office visit in 4 weeks, sooner if needed with cxr and pft's  06/08/2011 f/u ov/Bruce Travis cough gone but cc doe worse in cold damp weather, better p albuterol and no change off spiriva / advair. No sign cough/ excess mucus day or night. >>no changes  09/09/2011 Np post hosp f/u p admitted last month for influenza A and AECOPD , tx with tamiflu and steroids and nebs.  Is improving . Cough and dyspnea are back to his baseline.  CXR on 12/17 with chronic changes only.  rec No change   11/08/2011 Bruce Travis/ f/u ov on maint rx with symbicort and spiriva c/o nasal symptoms worse but  breathing fine, no limits as long as paces himself and no cough.  Main nasal issue is congestion/itching/sneezing worse x 2 weeks with purulent bloody secretions s HA/toothache  But ahs noted  year round nasal symptoms and worse x 2 years which do not correlate with breathing  status Stopped nasonex due to insurance > no worse off it> has not f/u with [name] yet for ent re-eval No daytime saba need rec Augmentin 875 mg twice daily x 10days Prednisone 10 mg take  4 each am x 2 days,   2 each am x 2 days,  1 each am x2days and stop See Dr [name] in 2 weeks to let him direct you further on what to do about your chronic sinus symptoms F/u prn  03/07/2012 f/u ov/Bruce Travis cc f/u mpn with doe x heavy yardwork but rarely the proaire and never the neb.  rec No change in your medications - if start needing the nebulizer we need to see you in pulmonary clinic as soon as possible  09/07/2012 f/u ov/Bruce Travis cc doe once a week saba, got choked NYEve but coughed it up and all better without abx. Not needing any saba   No obvious daytime variabilty or assoc chronic cough or cp or chest tightness, subjective wheeze overt sinus or hb symptoms. No unusual exp hx     Sleeping ok without nocturnal  or early am exacerbation  of respiratory  c/o's or need for noct saba. Also denies any obvious fluctuation of symptoms with weather or environmental changes or other aggravating or alleviating factors except as outlined above   ROS  The following are not active complaints unless bolded sore throat, dysphagia, dental problems, itching, sneezing,  nasal congestion or excess/ purulent secretions, ear ache,   fever, chills, sweats, unintended wt loss, pleuritic or exertional cp, hemoptysis,  orthopnea pnd or leg swelling, presyncope, palpitations, heartburn, abdominal pain, anorexia, nausea, vomiting, diarrhea  or change in bowel or urinary habits, change in stools or urine, dysuria,hematuria,  rash, arthralgias, visual complaints, headache, numbness weakness or ataxia or problems with walking or coordination,  change in mood/affect or memory.                         Objective:   Physical Exam  Hoarse amb wm nad Wt  140 04/08/2011 > 06/08/2011 141 >136 09/09/2011 > 141 11/08/2011 > 03/07/2012   139 > 09/07/2012  141  HEENT  Mod turbinate edema.  Edentulous/ Oropharynx no thrush or excess pnd or cobblestoning.  No JVD or cervical adenopathy. Mild accessory muscle hypertrophy. Trachea midline, nl thryroid. Chest was hyperinflated by percussion with diminished breath sounds  Regular rate and rhythm without murmur gallop or rub or increase P2 or edema.  Abd: no hsm, nl excursion - pos Hoover at mid insp. Ext warm without cyanosis or clubbing.       CXR  09/07/2012 :  1. Advanced changes of COPD redemonstrated, as above. 2. Calcified granuloma in the right lower lobe and chronic bilateral apical pleuroparenchymal scarring redemonstrated. 3. Atherosclerosis.  Assessment & Plan:

## 2012-09-07 NOTE — Progress Notes (Signed)
Quick Note:  Spoke with pt and notified of results per Dr. [name]. Pt verbalized understanding and denied any questions.  ______ 

## 2012-09-07 NOTE — Assessment & Plan Note (Signed)
No evidence of new macrocopic changes  Discussed in detail all the  indications, usual  risks and alternatives  relative to the benefits with patient who agrees to proceed with conservative f/u q 6 months with cxr's rather than ct's looking for any evidence of serial changes.

## 2012-09-07 NOTE — Patient Instructions (Addendum)
Please remember to go to the   x-ray department downstairs for your tests - we will call you with the results when they are available.  Please schedule a follow up visit in 6 months but call sooner if needed

## 2012-11-10 ENCOUNTER — Other Ambulatory Visit: Payer: Self-pay | Admitting: Gastroenterology

## 2012-12-12 ENCOUNTER — Ambulatory Visit (INDEPENDENT_AMBULATORY_CARE_PROVIDER_SITE_OTHER): Payer: Medicare Other | Admitting: Family Medicine

## 2012-12-12 ENCOUNTER — Encounter: Payer: Self-pay | Admitting: Family Medicine

## 2012-12-12 VITALS — BP 126/70 | HR 68 | Temp 98.1°F | Resp 20 | Ht 66.5 in | Wt 141.0 lb

## 2012-12-12 DIAGNOSIS — J441 Chronic obstructive pulmonary disease with (acute) exacerbation: Secondary | ICD-10-CM

## 2012-12-12 MED ORDER — LEVOFLOXACIN 500 MG PO TABS: 500.0000 mg | ORAL_TABLET | Freq: Every day | ORAL | Status: DC

## 2012-12-12 MED ORDER — HYDROCODONE-HOMATROPINE 5-1.5 MG/5ML PO SYRP: 5.0000 mL | ORAL_SOLUTION | ORAL | Status: DC | PRN

## 2012-12-12 MED ORDER — PREDNISONE 20 MG PO TABS: ORAL_TABLET | ORAL | Status: DC

## 2012-12-12 NOTE — Progress Notes (Signed)
Subjective:    Patient ID: Bruce Travis, male    DOB: 03-06-1935, 78 y.o.   MRN: [id number]  HPI  Patient reports 5 days of worsening cough, increasing shortness of breath, cough productive of green sputum, fever to 101. He also reports pleurisy.  He denies rhinorrhea, sore throat, or allergy symptoms. He denies any sinus pain or pressure. He denies any nausea vomiting or diarrhea.  He has a history of severe COPD, pneumonias requiring hospitalization, and rapid deterioration with COPD exacerbations.  He is using albuterol 6 times a day. He is also using his Symbicort and Spiriva. Past Medical History  Diagnosis Date  . Personal history of colonic polyps 04/26/2007    hyperplastic   . Diverticulosis of colon (without mention of hemorrhage)   . Family hx of colon cancer   . COPD (chronic obstructive pulmonary disease)   . Esophageal reflux   . Hypertension   . Hyperlipemia   . CAD (coronary artery disease)   . Throat cancer   . Other diseases of lung, not elsewhere classified   . Unspecified asthma   . Nephrolithiasis   . Allergic rhinitis   . Bronchiectasis    Current Outpatient Prescriptions on File Prior to Visit  Medication Sig Dispense Refill  . albuterol (PROVENTIL HFA;VENTOLIN HFA) 108 (90 BASE) MCG/ACT inhaler Inhale 2 puffs into the lungs every 4 (four) hours as needed. For shortness of breath      . AMBULATORY NON FORMULARY MEDICATION O2 2 Lpm at night      . aspirin 81 MG tablet Take 81 mg by mouth daily.       Marland Kitchen atorvastatin (LIPITOR) 40 MG tablet Take 40 mg by mouth at bedtime.       . budesonide-formoterol (SYMBICORT) 160-4.5 MCG/ACT inhaler Inhale 2 puffs into the lungs 2 (two) times daily.      Marland Kitchen Dextromethorphan-Guaifenesin (MUCINEX DM) 30-600 MG TB12 Take 1 tablet by mouth 2 (two) times daily as needed.  60 each  5  . DIOVAN 80 MG tablet Take 80 mg by mouth daily. 1 by mouth daily      . famotidine (PEPCID) 20 MG tablet Take 20 mg by mouth at bedtime.      .  lansoprazole (PREVACID) 30 MG capsule TAKE ONE CAPSULE BY MOUTH DAILY BEFORE BREAKFAST.  30 capsule  2  . SINGULAIR 10 MG tablet TAKE 1 TABLET BY MOUTH DAILY.  30 tablet  1  . tiotropium (SPIRIVA) 18 MCG inhalation capsule Place 18 mcg into inhaler and inhale daily.      . lansoprazole (PREVACID 24HR) 15 MG capsule TAKE 2 CAPSULES DAILY BEFORE BREAKFAST  60 capsule  5   No current facility-administered medications on file prior to visit.   History   Social History  . Marital Status: Married    Spouse Name: N/A    Number of Children: 3  . Years of Education: N/A   Occupational History  . retired     Psychologist, sport and exercise and Administrator   Social History Main Topics  . Smoking status: Former Smoker -- 2.00 packs/day for 35 years    Types: Cigarettes    Quit date: 01/21/1989  . Smokeless tobacco: Former Systems developer    Types: Chew    Quit date: 08/23/2001  . Alcohol Use: No  . Drug Use: No  . Sexually Active: Not on file   Other Topics Concern  . Not on file   Social History Narrative  . No narrative on file  Review of Systems    review of systems is otherwise negative Objective:   Physical Exam  Constitutional: He appears well-developed and well-nourished.  HENT:  Head: Normocephalic.  Mouth/Throat: Oropharynx is clear and moist.  Eyes: Conjunctivae are normal. Pupils are equal, round, and reactive to light.  Neck: Normal range of motion. Neck supple. No thyromegaly present.  Cardiovascular: Normal rate and normal heart sounds.   Pulmonary/Chest: Effort normal. No accessory muscle usage. No respiratory distress. He has decreased breath sounds in the right upper field, the right middle field, the right lower field, the left upper field, the left middle field and the left lower field. He has wheezes.  Abdominal: Soft. Bowel sounds are normal.          Assessment & Plan:  1. COPD exacerbation Prednisone 20 mg tablets, he is to take 3 tablets on days one through day 3, 2 tablets on  days 4 through day 6, and 1 tablet on day 7 through day 9.  Prescribed Levaquin 500 mg by mouth daily for 7 days. He is to use albuterol 2 puffs inhaled every 6 hours as needed. He is to continue his maintenance medicines. He is to followup in 48 hours if no better sooner if worse.

## 2012-12-19 ENCOUNTER — Encounter: Payer: Self-pay | Admitting: Family Medicine

## 2012-12-19 ENCOUNTER — Telehealth: Payer: Self-pay | Admitting: Family Medicine

## 2012-12-19 ENCOUNTER — Ambulatory Visit (INDEPENDENT_AMBULATORY_CARE_PROVIDER_SITE_OTHER): Payer: Medicare Other | Admitting: Family Medicine

## 2012-12-19 VITALS — BP 100/62 | HR 48 | Temp 98.0°F | Resp 22 | Wt 139.0 lb

## 2012-12-19 DIAGNOSIS — J441 Chronic obstructive pulmonary disease with (acute) exacerbation: Secondary | ICD-10-CM

## 2012-12-19 MED ORDER — PREDNISONE 20 MG PO TABS: ORAL_TABLET | ORAL | Status: DC

## 2012-12-19 MED ORDER — ALBUTEROL SULFATE HFA 108 (90 BASE) MCG/ACT IN AERS: 2.0000 | INHALATION_SPRAY | RESPIRATORY_TRACT | Status: DC | PRN

## 2012-12-19 MED ORDER — FLUTICASONE PROPIONATE 50 MCG/ACT NA SUSP: 2.0000 | Freq: Every day | NASAL | Status: DC

## 2012-12-19 NOTE — Progress Notes (Signed)
Subjective:    Patient ID: Bruce Travis, male    DOB: [birth year], 78 y.o.   MRN: [id number]  HPI  1 week ago Patient reports 5 days of worsening cough, increasing shortness of breath, cough productive of green sputum, fever to 101. He also reports pleurisy.  He denies rhinorrhea, sore throat, or allergy symptoms. He denies any sinus pain or pressure. He denies any nausea vomiting or diarrhea.  He has a history of severe COPD, pneumonias requiring hospitalization, and rapid deterioration with COPD exacerbations.  He is using albuterol 6 times a day. He is also using his Symbicort and Spiriva. Therefore, I diagnosed him with: 1. COPD exacerbation Prednisone 20 mg tablets, he is to take 3 tablets on days one through day 3, 2 tablets on days 4 through day 6, and 1 tablet on day 7 through day 9.  Prescribed Levaquin 500 mg by mouth daily for 7 days. He is to use albuterol 2 puffs inhaled every 6 hours as needed. He is to continue his maintenance medicines. He is to followup in 48 hours if no better sooner if worse.  12/19/12 Patient's fever has stopped. He has no rhinorrhea. He continues to have cough productive of white sputum. He continued to have shortness of breath. He continues to have wheezing. He is feeling some better. He is not back to his baseline. Past Medical History  Diagnosis Date  . Personal history of colonic polyps 04/26/2007    hyperplastic   . Diverticulosis of colon (without mention of hemorrhage)   . Family hx of colon cancer   . COPD (chronic obstructive pulmonary disease)   . Esophageal reflux   . Hypertension   . Hyperlipemia   . CAD (coronary artery disease)   . Throat cancer   . Other diseases of lung, not elsewhere classified   . Unspecified asthma   . Nephrolithiasis   . Allergic rhinitis   . Bronchiectasis    Current Outpatient Prescriptions on File Prior to Visit  Medication Sig Dispense Refill  . AMBULATORY NON FORMULARY MEDICATION O2 2 Lpm at night      .  aspirin 81 MG tablet Take 81 mg by mouth daily.       Marland Kitchen atorvastatin (LIPITOR) 40 MG tablet Take 40 mg by mouth at bedtime.       . budesonide-formoterol (SYMBICORT) 160-4.5 MCG/ACT inhaler Inhale 2 puffs into the lungs 2 (two) times daily.      Marland Kitchen Dextromethorphan-Guaifenesin (MUCINEX DM) 30-600 MG TB12 Take 1 tablet by mouth 2 (two) times daily as needed.  60 each  5  . DIOVAN 80 MG tablet Take 80 mg by mouth daily. 1 by mouth daily      . famotidine (PEPCID) 20 MG tablet Take 20 mg by mouth at bedtime.      Marland Kitchen HYDROcodone-homatropine (HYCODAN) 5-1.5 MG/5ML syrup Take 5-10 mLs by mouth every 4 (four) hours as needed. For cough  120 mL  0  . lansoprazole (PREVACID 24HR) 15 MG capsule TAKE 2 CAPSULES DAILY BEFORE BREAKFAST  60 capsule  5  . lansoprazole (PREVACID) 30 MG capsule TAKE ONE CAPSULE BY MOUTH DAILY BEFORE BREAKFAST.  30 capsule  2  . SINGULAIR 10 MG tablet TAKE 1 TABLET BY MOUTH DAILY.  30 tablet  1  . tiotropium (SPIRIVA) 18 MCG inhalation capsule Place 18 mcg into inhaler and inhale daily.       No current facility-administered medications on file prior to visit.   History  Social History  . Marital Status: Married    Spouse Name: N/A    Number of Children: 3  . Years of Education: N/A   Occupational History  . retired     Psychologist, sport and exercise and Administrator   Social History Main Topics  . Smoking status: Former Smoker -- 2.00 packs/day for 35 years    Types: Cigarettes    Quit date: 01/21/1989  . Smokeless tobacco: Former Systems developer    Types: Chew    Quit date: 08/23/2001  . Alcohol Use: No  . Drug Use: No  . Sexually Active: Not on file   Other Topics Concern  . Not on file   Social History Narrative  . No narrative on file     Review of Systems     review of systems is otherwise negative Objective:   Physical Exam  Constitutional: He appears well-developed and well-nourished.  HENT:  Head: Normocephalic.  Mouth/Throat: Oropharynx is clear and moist.  Eyes:  Conjunctivae are normal. Pupils are equal, round, and reactive to light.  Neck: Normal range of motion. Neck supple. No thyromegaly present.  Cardiovascular: Normal rate and normal heart sounds.   Pulmonary/Chest: Effort normal. No accessory muscle usage. No respiratory distress. He has decreased breath sounds in the right upper field, the right middle field, the right lower field, the left upper field, the left middle field and the left lower field. He has wheezes.  Abdominal: Soft. Bowel sounds are normal.          Assessment & Plan:  1. COPD exacerbation He continues to wheeze and have shortness of breath. I feel we need to lengthen his steroid taper as I do not feel he is ready to discontinue steroids at this point. Therefore I have represcribed Prednisone 20 mg tablets.  He is to take 3 tablets on days one through day 3, 2 tablets on days 4 through day 6, and 1 tablet on day 7 through day 9. He is to use albuterol 2 puffs inhaled every 6 hours as needed. He is to continue his maintenance medicines.   I also recommended he continue Mucinex 1200 mg by mouth twice a day as a mucolytic.

## 2012-12-19 NOTE — Telephone Encounter (Signed)
Still sick on last day of prednisone has finished antibiotic.  A lot of coughing, thick greenish secretions.  Short of breath but states no more then usual.  Diff sleeping w/o sitting up. Pt given appt today to come see provider.

## 2013-02-02 ENCOUNTER — Other Ambulatory Visit: Payer: Self-pay | Admitting: Family Medicine

## 2013-02-13 ENCOUNTER — Other Ambulatory Visit: Payer: Self-pay | Admitting: *Deleted

## 2013-02-13 MED ORDER — LANSOPRAZOLE 30 MG PO CPDR: 30.0000 mg | DELAYED_RELEASE_CAPSULE | Freq: Every day | ORAL | Status: DC

## 2013-03-06 ENCOUNTER — Encounter: Payer: Self-pay | Admitting: Internal Medicine

## 2013-03-06 ENCOUNTER — Ambulatory Visit (INDEPENDENT_AMBULATORY_CARE_PROVIDER_SITE_OTHER): Payer: Medicare Other | Admitting: Internal Medicine

## 2013-03-06 ENCOUNTER — Ambulatory Visit (INDEPENDENT_AMBULATORY_CARE_PROVIDER_SITE_OTHER)
Admission: RE | Admit: 2013-03-06 | Discharge: 2013-03-06 | Disposition: A | Discharge status: Home / Self Care | Payer: Medicare Other | Source: Ambulatory Visit | Attending: Internal Medicine | Admitting: Internal Medicine

## 2013-03-06 VITALS — BP 124/80 | HR 74 | Temp 97.0°F | Ht 67.0 in | Wt 135.8 lb

## 2013-03-06 DIAGNOSIS — J984 Other disorders of lung: Secondary | ICD-10-CM

## 2013-03-06 DIAGNOSIS — G4734 Idiopathic sleep related nonobstructive alveolar hypoventilation: Secondary | ICD-10-CM | POA: Insufficient documentation

## 2013-03-06 DIAGNOSIS — J4489 Other specified chronic obstructive pulmonary disease: Secondary | ICD-10-CM

## 2013-03-06 DIAGNOSIS — R0902 Hypoxemia: Secondary | ICD-10-CM

## 2013-03-06 DIAGNOSIS — J449 Chronic obstructive pulmonary disease, unspecified: Secondary | ICD-10-CM

## 2013-03-06 NOTE — Assessment & Plan Note (Signed)
rx per Dr Dennard Schaumann 2lpm at hs

## 2013-03-06 NOTE — Assessment & Plan Note (Signed)
>  CT 2010 no changes  >CT chest 03/12/2011 >>Severe centrilobular emphysema with areas of new and persistent  nodularity  A new somewhat spiculated opacity within the left upper lobe  measures 1.6 x 2.5 cm on transverse image 13 of series 6 > CT chest 03/07/2012 1. The vast majority of the previously noted pulmonary nodules are unchanged in size, number and distribution (and the overall appearance of the lungs could suggest sequelae of a pneumoconiosis or simply sequelae of old granulomatous disease), with exception of the right lower lobe pulmonary nodules as mentioned above. The largest of these new nodules is 7 mm and has spiculated margins and some surrounding architectural distortion.  - Repeat ct rec 03/06/2013 >>>  The only lesion of concern is the 7 mm one > Discussed in detail all the  indications, usual  risks and alternatives  relative to the benefits with patient who agrees to proceed with repeat CT this year.

## 2013-03-06 NOTE — Assessment & Plan Note (Signed)
-   PFT's 06/08/11  FEV1  1.71 (67%) ratio 37 and no better p B2 with DLC0 54%       The proper method of use, as well as anticipated side effects, of a metered-dose inhaler are discussed and demonstrated to the patient. Improved effectiveness after extensive coaching during this visit to a level of approximately  90% so continue symbicort 160 and spiriva and just use saba prn   He is actually doing quite well on present rx and pulmonary f/u can be prn

## 2013-03-06 NOTE — Patient Instructions (Addendum)
Only use your albuterol (proaire) as a rescue medication to be used if you can't catch your breath by resting or doing a relaxed purse lip breathing pattern. The less you use it, the better it will work when you need it.   Ok to use proaire up to 2  Puffs every 4 hours but if you notice a significant increase in the need we need to see you back right away  Please see patient coordinator before you leave today  to schedule CT chest to compare to previous studies for your lung nodules   If you are satisfied with your treatment plan let your doctor know and he/she can either refill your medications or you can return here when your prescription runs out.     If in any way you are not 100% satisfied,  please tell us.  If 100% better, tell your friends!

## 2013-03-06 NOTE — Progress Notes (Signed)
Subjective:    Patient ID: Bruce Travis, male    DOB: [birth year]    MRN: [id number]  Brief patient profile:   30 yowm   quit smoking 1990 with GOLD II  COPD,  MPNs,  remote laryngeal cancer with some dysphagia, allergic rhinitis, and esophageal reflux    HPI 11/08/2011 Nisa Decaire/ f/u ov on maint rx with symbicort and spiriva c/o nasal symptoms worse but  breathing fine, no limits as long as paces himself and no cough.  Main nasal issue is congestion/itching/sneezing worse x 2 weeks with purulent bloody secretions s HA/toothache  But ahs noted  year round nasal symptoms and worse x 2 years which do not correlate with breathing status Stopped nasonex due to insurance > no worse off it> has not f/u with [name] yet for ent re-eval No daytime saba need rec Augmentin 875 mg twice daily x 10days Prednisone 10 mg take  4 each am x 2 days,   2 each am x 2 days,  1 each am x2days and stop See Dr [name] in 2 weeks to let him direct you further on what to do about your chronic sinus symptoms       09/07/2012 f/u ov/Perrie Ragin cc doe once a week saba, got choked NYEve but coughed it up and all better without abx. Not needing any saba  rec No change   03/06/2013 f/u ov/Lashawn Bromwell re GOLD II COPD/ chronic hoarse p surg ([facility]) Chief Complaint  Patient presents with  . Follow-up    Pt states breathing is the same since last visit, no better or worse. Denies any new co's today.    main issue is yardwork in heat, bettter ex tol p uses saba, hfa good techniqe, never uses neb   No obvious daytime variabilty or assoc chronic cough or cp or chest tightness, subjective wheeze overt sinus or hb symptoms. No unusual exp hx     Sleeping ok  On 02 without nocturnal  or early am exacerbation  of respiratory  c/o's or need for noct saba. Also denies any obvious fluctuation of symptoms with weather or environmental changes or other aggravating or alleviating factors except as outlined above   Current Medications, Allergies, Past  Medical History, Past Surgical History, Family History, and Social History were reviewed in [employer] record.  ROS  The following are not active complaints unless bolded sore throat, dysphagia, dental problems, itching, sneezing,  nasal congestion or excess/ purulent secretions, ear ache,   fever, chills, sweats, unintended wt loss, pleuritic or exertional cp, hemoptysis,  orthopnea pnd or leg swelling, presyncope, palpitations, heartburn, abdominal pain, anorexia, nausea, vomiting, diarrhea  or change in bowel or urinary habits, change in stools or urine, dysuria,hematuria,  rash, arthralgias, visual complaints, headache, numbness weakness or ataxia or problems with walking or coordination,  change in mood/affect or memory.    .           Objective:   Physical Exam  Hoarse amb wm nad Wt  140 04/08/2011 > 06/08/2011 141 >136 09/09/2011 > 141 11/08/2011 > 03/07/2012  139 > 09/07/2012  141 > 136 03/06/2013  HEENT  Mod turbinate edema.  Edentulous/ Oropharynx no thrush or excess pnd or cobblestoning.  No JVD or cervical adenopathy. Mild accessory muscle hypertrophy. Trachea midline, nl thryroid. Chest was hyperinflated by percussion with diminished breath sounds  Regular rate and rhythm without murmur gallop or rub or increase P2 or edema.  Abd: no hsm, nl excursion - pos [employer]  at mid insp. Ext warm without cyanosis or clubbing.       CXR  09/07/2012 :  1. Advanced changes of COPD redemonstrated, as above. 2. Calcified granuloma in the right lower lobe and chronic bilateral apical pleuroparenchymal scarring redemonstrated. 3. Atherosclerosis.         Assessment & Plan:

## 2013-03-07 ENCOUNTER — Telehealth: Payer: Self-pay | Admitting: Internal Medicine

## 2013-03-07 NOTE — Telephone Encounter (Signed)
Notes Recorded by Tanda Rockers, MD on 03/06/2013 at 8:56 PM Call pt: No change nodules x 1 year, no further f/u needed. ------  Notes Recorded by Tanda Rockers, MD on 03/06/2013 at 5:13 PM Call patient : Study is unremarkable, no change in recs ---  I spoke with patient about results and he verbalized understanding and had no questions

## 2013-03-21 ENCOUNTER — Encounter: Payer: Self-pay | Admitting: Family Medicine

## 2013-03-21 ENCOUNTER — Ambulatory Visit (INDEPENDENT_AMBULATORY_CARE_PROVIDER_SITE_OTHER): Payer: Medicare Other | Admitting: Family Medicine

## 2013-03-21 VITALS — BP 140/84 | HR 62 | Temp 97.9°F | Resp 20 | Wt 135.0 lb

## 2013-03-21 DIAGNOSIS — IMO0002 Reserved for concepts with insufficient information to code with codable children: Secondary | ICD-10-CM

## 2013-03-21 DIAGNOSIS — T148XXA Other injury of unspecified body region, initial encounter: Secondary | ICD-10-CM

## 2013-03-21 NOTE — Patient Instructions (Addendum)
Come back Monday to have it checked    Skin Tear Care A skin tear is when the top layer of skin peels off. To repair the skin, your doctor may use:   Tape.  Skin adhesive strips. HOME CARE  Change bandages (dressings) once a day or as told by your doctor.  Gently clean the area with salt (saline) solution or with a mild soap and water.  Do not rub the injured skin dry. Let the area air dry.  Put petroleum jelly or antibiotic cream on the tear. Do not allow a scab to form.  If the bandage sticks, moisten it with warm soapy water and remove it.  Protect the injured skin until it has healed.  Only take medicine as told by your doctor.  Take showers or baths using warm soapy water. Apply a new bandage after the shower or bath.  Keep all doctor visits as told. GET HELP RIGHT AWAY IF:   You have redness, puffiness (swelling), or more pain in the tear.  You have ayellowish-white fluid (pus) coming from the tear.  You have chills.  You have a red streak that goes away from the tear.  You have a bad smell coming from the tear or bandage.  You have a fever or lasting symptoms for more than 2 3 days.  You have a fever and your symptoms suddenly get worse. MAKE SURE YOU:   Understand these instructions.  Will watch this condition.  Will get help right away if you are not doing well or get worse. Document Released: 05/18/2008 Document Revised: 05/03/2012 Document Reviewed: 02/21/2012 [facility] Patient Information 2014 [place].

## 2013-03-21 NOTE — Progress Notes (Signed)
  Subjective:    Patient ID: Bruce Travis, male    DOB: Nov 08, 1934, 78 y.o.   MRN: [id number]  HPI  Pt walked in, this AM caught his arm on his suspenders which resulted in tearing of his skin. Came in to have it looked at. Denies pain, minimal bleeding.  TDAP UTD 2013. Denies fall, he was working on Conservation officer, nature at the time.   Review of Systems - per above  GEN- denies fatigue, fever, weight loss,weakness, recent illness MSK- denies joint pain, muscle aches, injury       Objective:   Physical Exam  GEN-NAD,alert and oriented x3 SKIN-Skin tear to Right forearm- 2.5 iches long, no pus, minimal bleeding , no erythema surrounding Pulse- radial pulse 2+       Assessment & Plan:

## 2013-03-21 NOTE — Assessment & Plan Note (Signed)
tegaderm applied after irrigation with saline Will recheck on Monday  Pt given signs of infection

## 2013-03-26 ENCOUNTER — Ambulatory Visit (INDEPENDENT_AMBULATORY_CARE_PROVIDER_SITE_OTHER): Payer: Medicare Other | Admitting: Family Medicine

## 2013-03-26 ENCOUNTER — Encounter: Payer: Self-pay | Admitting: Family Medicine

## 2013-03-26 VITALS — BP 130/72 | HR 60 | Resp 20 | Wt 135.0 lb

## 2013-03-26 DIAGNOSIS — IMO0002 Reserved for concepts with insufficient information to code with codable children: Secondary | ICD-10-CM

## 2013-03-26 DIAGNOSIS — T148XXA Other injury of unspecified body region, initial encounter: Secondary | ICD-10-CM

## 2013-03-26 NOTE — Progress Notes (Signed)
  Subjective:    Patient ID: Bruce Travis, male    DOB: 27-Oct-1934, 78 y.o.   MRN: [id number]  HPI Patient here to followup laceration to arm. Diagnosis skin tear last week for her was applied. He had some mild oozing beneath the bandage but no foul odor or pus noted. He's here for recheck.   Review of Systems  - per above  GEN- no fever, no fatigue  Skin- +skin tear,     Objective:   Physical Exam  GEN-NAD,alert and oriented x3 SKIN-Skin tear to Right forearm- 2 iches long, no pus, no bleeding, no cellulitic changes, healing well along tear      Assessment & Plan:

## 2013-03-26 NOTE — Assessment & Plan Note (Addendum)
Skin tears healing very well. At this point he has no bleeding in the tear looks good with a  small linear opening. This was closed at the bedside. Triple antibiotic ointment applied to keep it moist and a bandage was applied. He can remove this after 24 hours ,shower clean and reapply bandage until the skin is completely healed over. Given red flags to return as needed

## 2013-03-26 NOTE — Patient Instructions (Addendum)
Take bandage off in 24 hours, clean and reapply the bandage  Come in if you notice pus or oozing F/U as needed

## 2013-04-03 ENCOUNTER — Telehealth: Payer: Self-pay | Admitting: Family Medicine

## 2013-04-03 NOTE — Telephone Encounter (Signed)
Pt left a message during lunch saying that when he went to go get his Diovan refilled they told him they were going to give him the generic (Valsartan). He wants to speak with someone about this before he picks it up.

## 2013-04-03 NOTE — Telephone Encounter (Signed)
Called patient. He was upset that insurance company changing his medication.  Told patient all the companies want you  to use the generic meds when they become available.  Is OK to get generic and use.  If he feels he is having a problem with it to let us know

## 2013-04-12 ENCOUNTER — Other Ambulatory Visit: Payer: Self-pay | Admitting: Family Medicine

## 2013-05-10 ENCOUNTER — Other Ambulatory Visit: Payer: Self-pay | Admitting: Family Medicine

## 2013-05-11 ENCOUNTER — Ambulatory Visit (INDEPENDENT_AMBULATORY_CARE_PROVIDER_SITE_OTHER): Payer: Medicare Other | Admitting: *Deleted

## 2013-05-11 VITALS — Temp 97.8°F

## 2013-05-11 DIAGNOSIS — Z23 Encounter for immunization: Secondary | ICD-10-CM

## 2013-05-24 ENCOUNTER — Telehealth: Payer: Self-pay | Admitting: Family Medicine

## 2013-05-24 NOTE — Telephone Encounter (Signed)
He needs a written Rx for oxygen to take with him out of town-Going to visit son Call [phone]

## 2013-05-25 NOTE — Telephone Encounter (Signed)
Rx left up front for pt

## 2013-06-05 ENCOUNTER — Telehealth: Payer: Self-pay | Admitting: Gastroenterology

## 2013-06-05 NOTE — Telephone Encounter (Signed)
Labs and Hemoccults not needed

## 2013-06-05 NOTE — Telephone Encounter (Signed)
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 He has home oxygen dependency and shortness of breath with exertion. He's had previous extensive GI evaluations which have confirmed acid reflux, and he is doing well on daily PPI therapy. He also had bowel irregularity, gas and bloating, this has improved tremendously on gluten-free diet. Currently is having regular bowel movements without melena or hematochezia. Review of his labs from year ago appear unremarkable with normal hemoglobin. He denies any hepatobiliary complaints, dyspepsia, dysphagia, nausea vomiting, anorexia or weight loss. Family history is noncontributory terms of colon cancer.      Pt was given IFOB test and CBC, CMET, Anemia Profile Celiac serologies done. IFOB was negative, labs were normal and celiac studies had greatly improved on Gluten free diet. Pt is a poor candidate for a procedure, Dr Sharlett Iles, would you like those same labs repeated and can we do Hemoccult cards intead of IFOB because I need to mail it to him? Thanks

## 2013-06-06 NOTE — Telephone Encounter (Signed)
Informed pt he does not need labs or need to check his stools for blood this year.. Informed him he must call if he has problems or change in bowel habits; pt stated understanding.

## 2013-07-10 ENCOUNTER — Other Ambulatory Visit: Payer: Self-pay | Admitting: Family Medicine

## 2013-07-10 ENCOUNTER — Encounter: Payer: Self-pay | Admitting: Family Medicine

## 2013-07-10 ENCOUNTER — Ambulatory Visit (INDEPENDENT_AMBULATORY_CARE_PROVIDER_SITE_OTHER): Payer: Medicare Other | Admitting: Family Medicine

## 2013-07-10 VITALS — BP 110/78 | HR 72 | Temp 97.4°F | Resp 20 | Wt 139.0 lb

## 2013-07-10 DIAGNOSIS — T148XXA Other injury of unspecified body region, initial encounter: Secondary | ICD-10-CM

## 2013-07-10 DIAGNOSIS — IMO0002 Reserved for concepts with insufficient information to code with codable children: Secondary | ICD-10-CM

## 2013-07-10 MED ORDER — PREDNISONE 20 MG PO TABS: ORAL_TABLET | ORAL | Status: DC

## 2013-07-10 MED ORDER — MOXIFLOXACIN HCL 400 MG PO TABS: 400.0000 mg | ORAL_TABLET | Freq: Every day | ORAL | Status: DC

## 2013-07-10 NOTE — Progress Notes (Signed)
Subjective:    Patient ID: Bruce Travis, male    DOB: December 02, 1934, 78 y.o.   MRN: [id number]  HPI  10 days ago, the patient suffered a skin tear to the dorsum of his left forearm. He was seen at the emergency room where they removed the avulsed skin. He is concerned because it is not healing. It is also very tender and sore. He has a 1.7 x 4 cm superficial skin tear on the dorsum of his left forearm. He also has a 1 cm x 1 cm skin tear distal to the larger lesion. There is no evidence of saline as her abscess. There is a peripheral rim of healthy granulation tissue that appears to show healing. The base of the wound is not infected and appears healthy. The patient also has a head cold characterized by sinus congestion and postnasal drip. He denies any worsening shortness of breath. He has a history of severe COPD. He also has been discharged by his pulmonologist to our care. He has a history of a calcified granuloma in the left upper lobe is stable. He has a 7 mm nodule in the right lower lobe that has not changed in one year based on CT in July of 2014. Past Medical History  Diagnosis Date  . Personal history of colonic polyps 04/26/2007    hyperplastic   . Diverticulosis of colon (without mention of hemorrhage)   . Family hx of colon cancer   . COPD (chronic obstructive pulmonary disease)   . Esophageal reflux   . Hypertension   . Hyperlipemia   . CAD (coronary artery disease)   . Throat cancer   . Other diseases of lung, not elsewhere classified   . Unspecified asthma(493.90)   . Nephrolithiasis   . Allergic rhinitis   . Bronchiectasis    Current Outpatient Prescriptions on File Prior to Visit  Medication Sig Dispense Refill  . albuterol (PROVENTIL HFA;VENTOLIN HFA) 108 (90 BASE) MCG/ACT inhaler Inhale 2 puffs into the lungs every 4 (four) hours as needed. For shortness of breath  1 Inhaler  3  . AMBULATORY NON FORMULARY MEDICATION O2 2 Lpm at night      . aspirin 81 MG tablet Take 81 mg  by mouth daily.       Marland Kitchen atorvastatin (LIPITOR) 40 MG tablet TAKE ONE TABLET BY MOUTH AT BEDTIME.  30 tablet  5  . famotidine (PEPCID) 20 MG tablet Take 20 mg by mouth at bedtime.      . fluticasone (FLONASE) 50 MCG/ACT nasal spray Place 2 sprays into the nose daily.  16 g  6  . lansoprazole (PREVACID) 30 MG capsule Take 1 capsule (30 mg total) by mouth daily.  30 capsule  6  . SINGULAIR 10 MG tablet TAKE 1 TABLET BY MOUTH DAILY.  30 tablet  1  . SPIRIVA HANDIHALER 18 MCG inhalation capsule INHALE 1 CAPSULE ONCE DAILY AS DIRECTED  30 capsule  PRN  . SYMBICORT 160-4.5 MCG/ACT inhaler INHALE 2 PUFFS FIRST THING IN MORNING AND THEN ANOTHER 2 PUFFS ABOUT 12 HOURS LATER.  10.2 g  PRN  . valsartan (DIOVAN) 80 MG tablet TAKE ONE TABLET BY MOUTH DAILY.  30 tablet  5   No current facility-administered medications on file prior to visit.   Allergies  Allergen Reactions  . Codeine Other (See Comments)    Thought head was going to blow off   History   Social History  . Marital Status: Married  Spouse Name: N/A    Number of Children: 3  . Years of Education: N/A   Occupational History  . retired     Psychologist, sport and exercise and Administrator   Social History Main Topics  . Smoking status: Former Smoker -- 2.00 packs/day for 35 years    Types: Cigarettes    Quit date: 01/21/1989  . Smokeless tobacco: Former Systems developer    Types: Chew    Quit date: 08/23/2001  . Alcohol Use: No  . Drug Use: No  . Sexual Activity: Not on file   Other Topics Concern  . Not on file   Social History Narrative  . No narrative on file     Review of Systems  All other systems reviewed and are negative.       Objective:   Physical Exam  Vitals reviewed. HENT:  Right Ear: External ear normal.  Left Ear: External ear normal.  Nose: Nose normal.  Mouth/Throat: Oropharynx is clear and moist.  Eyes: Conjunctivae are normal. No scleral icterus.  Neck: Neck supple.  Cardiovascular: Normal rate and regular rhythm.    Pulmonary/Chest: Effort normal. No respiratory distress. He has no wheezes. He has no rales.  Abdominal: Soft. Bowel sounds are normal.  Lymphadenopathy:    He has no cervical adenopathy.   1.7 cm x 4 cm skin tear on the dorsum of left forearm. 1 cm x 1 cm skin tear on the dorsum of the left forearm       Assessment & Plan:  1. Skin tear There is no evidence of infection. Wound care was discussed. I covered the wound bed with Silvadene. The wound was then covered with a nonadherent gauze and wrapped with Coban. Again the patient supplies to perform this daily at home. Recheck in one week or sooner if worse.  I believe the patient also has a simple head cold. I did give him a prescription for Avelox as well as prednisone with instructions not to fill unless he develops wheezing or shortness of breath consistent with COPD exacerbation. He gets these quite frequently when he gets a head cold. Regarding the pulmonary nodule in the right lower lobe, the patient and I agreed to repeat a CT scan in the summer of 2015 the lesion is stable, we will not pursue any further screening.

## 2013-08-14 ENCOUNTER — Ambulatory Visit (INDEPENDENT_AMBULATORY_CARE_PROVIDER_SITE_OTHER): Payer: Medicare Other | Admitting: Family Medicine

## 2013-08-14 DIAGNOSIS — Z23 Encounter for immunization: Secondary | ICD-10-CM

## 2013-08-28 ENCOUNTER — Ambulatory Visit (INDEPENDENT_AMBULATORY_CARE_PROVIDER_SITE_OTHER): Payer: Medicare Other | Admitting: Gastroenterology

## 2013-08-28 ENCOUNTER — Other Ambulatory Visit (INDEPENDENT_AMBULATORY_CARE_PROVIDER_SITE_OTHER): Payer: Medicare Other

## 2013-08-28 ENCOUNTER — Encounter: Payer: Self-pay | Admitting: Gastroenterology

## 2013-08-28 VITALS — BP 140/62 | HR 73 | Ht 66.5 in | Wt 137.0 lb

## 2013-08-28 DIAGNOSIS — K900 Celiac disease: Secondary | ICD-10-CM

## 2013-08-28 DIAGNOSIS — Z800 Family history of malignant neoplasm of digestive organs: Secondary | ICD-10-CM

## 2013-08-28 LAB — CBC WITH DIFFERENTIAL/PLATELET
Basophils Absolute: 0 10*3/uL (ref 0.0–0.1)
Basophils Relative: 0.3 % (ref 0.0–3.0)
EOS PCT: 3.7 % (ref 0.0–5.0)
Eosinophils Absolute: 0.3 10*3/uL (ref 0.0–0.7)
HEMATOCRIT: 51.3 % (ref 39.0–52.0)
Hemoglobin: 17.3 g/dL — ABNORMAL HIGH (ref 13.0–17.0)
LYMPHS ABS: 1.9 10*3/uL (ref 0.7–4.0)
LYMPHS PCT: 27.8 % (ref 12.0–46.0)
MCHC: 33.7 g/dL (ref 30.0–36.0)
MCV: 98.7 fl (ref 78.0–100.0)
MONOS PCT: 9.1 % (ref 3.0–12.0)
Monocytes Absolute: 0.6 10*3/uL (ref 0.1–1.0)
Neutro Abs: 4.1 10*3/uL (ref 1.4–7.7)
Neutrophils Relative %: 59.1 % (ref 43.0–77.0)
PLATELETS: 181 10*3/uL (ref 150.0–400.0)
RBC: 5.19 Mil/uL (ref 4.22–5.81)
RDW: 14.2 % (ref 11.5–14.6)
WBC: 6.9 10*3/uL (ref 4.5–10.5)

## 2013-08-28 LAB — IBC PANEL
IRON: 101 ug/dL (ref 42–165)
SATURATION RATIOS: 31.3 % (ref 20.0–50.0)
TRANSFERRIN: 230.2 mg/dL (ref 212.0–360.0)

## 2013-08-28 LAB — FERRITIN: FERRITIN: 119.6 ng/mL (ref 22.0–322.0)

## 2013-08-28 LAB — FOLATE: Folate: 9.8 ng/mL (ref >5.9)

## 2013-08-28 LAB — VITAMIN B12: VITAMIN B 12: 321 pg/mL (ref 211–911)

## 2013-08-28 NOTE — Patient Instructions (Signed)
You will receive a a call from [facility] in regards to your Cologuard test   Your physician has requested that you go to the basement for the following lab work before leaving today: CBC Anemia Panel Celiac Panel

## 2013-08-28 NOTE — Progress Notes (Signed)
This is a 79 year old Caucasian male with acid reflux and apparently celiac disease who is doing extremely well on a gluten free diet and daily lansoprazole 30 mg.  He has severe COPD and is on home oxygen and multiple inhalers.  He is strong family history of both prostate and colon cancer in his father.  He had a negative colonoscopy in 2008.  Is not felt to be a repeat candidate for colonoscopy because of his COPD.  He denies a current GI complaints is having regular bowel movements without melena or hematochezia, abdominal pain, acid reflux symptoms or dysphagia.  He is currently retired.  Recent neurologic examination was unremarkable.  Current Medications, Allergies, Past Medical History, Past Surgical History, Family History and Social History were reviewed in [facility] record.  ROS: All systems were reviewed and are negative unless otherwise stated in the HPI.          Physical Exam: Healthy-appearing patient in no acute distress.  Blood pressure 140/62, pulse 73 and weight 137 with a BMI of 21.78.  I cannot appreciate stigmata of chronic liver disease.  His abdomen shows no organomegaly, masses or tenderness.  Bowel sounds are normal.  Rectal exam is deferred.    Assessment and Plan: I have decided to perform COLOGUARD stool DNA testing as a screening for colon cancer in this patient who is a poor colonoscopy candidate.  Also to assess the status of his celiac disease we will check gluten serology, CBC and anemia profile.  Previous celiac serologies and 2013 were negative, and I cannot see where he has had previous small bowel biopsy.  In any case, he seems to be doing extremely well on his current medications and dietary regime.  If he has problems in the future have asked him to contact Dr. Scarlette Shorts for followup.  Continue his daily PPI for acid reflux as tolerated.

## 2013-08-29 LAB — CELIAC PANEL 10
Endomysial Screen: NEGATIVE
Gliadin IgA: 7.4 U/mL (ref <20)
Gliadin IgG: 3.6 U/mL (ref <20)
IgA: 215 mg/dL (ref 68–379)
TISSUE TRANSGLUT AB: 7.2 U/mL (ref <20)
TISSUE TRANSGLUTAMINASE AB, IGA: 6.2 U/mL (ref <20)

## 2013-09-03 ENCOUNTER — Telehealth: Payer: Self-pay | Admitting: Gastroenterology

## 2013-09-03 NOTE — Telephone Encounter (Signed)
Notes Recorded by Sable Feil, MD on 08/29/2013 at 3:03 PM Tell him his labs are all better and to stay on his gluten restricted diet Notes Recorded by Sable Feil, MD on 08/29/2013 at 8:25 AM All labs are ok        Informed pt of lab results and he requested a copy. Mailed.

## 2013-09-17 ENCOUNTER — Other Ambulatory Visit: Payer: Medicare Other

## 2013-09-17 ENCOUNTER — Telehealth: Payer: Self-pay | Admitting: Gastroenterology

## 2013-09-17 DIAGNOSIS — R7989 Other specified abnormal findings of blood chemistry: Secondary | ICD-10-CM

## 2013-09-17 DIAGNOSIS — Z125 Encounter for screening for malignant neoplasm of prostate: Secondary | ICD-10-CM

## 2013-09-17 DIAGNOSIS — E785 Hyperlipidemia, unspecified: Secondary | ICD-10-CM

## 2013-09-17 DIAGNOSIS — I10 Essential (primary) hypertension: Secondary | ICD-10-CM

## 2013-09-17 LAB — LIPID PANEL
CHOL/HDL RATIO: 3.6 ratio
Cholesterol: 124 mg/dL (ref 0–200)
HDL: 34 mg/dL — ABNORMAL LOW (ref >39)
LDL CALC: 61 mg/dL (ref 0–99)
Triglycerides: 145 mg/dL (ref <150)
VLDL: 29 mg/dL (ref 0–40)

## 2013-09-17 LAB — COMPREHENSIVE METABOLIC PANEL
ALK PHOS: 75 U/L (ref 39–117)
ALT: 28 U/L (ref 0–53)
AST: 19 U/L (ref 0–37)
Albumin: 4.1 g/dL (ref 3.5–5.2)
BILIRUBIN TOTAL: 0.7 mg/dL (ref 0.3–1.2)
BUN: 19 mg/dL (ref 6–23)
CO2: 27 mEq/L (ref 19–32)
CREATININE: 1.23 mg/dL (ref 0.50–1.35)
Calcium: 9.6 mg/dL (ref 8.4–10.5)
Chloride: 107 mEq/L (ref 96–112)
Glucose, Bld: 92 mg/dL (ref 70–99)
Potassium: 4.7 mEq/L (ref 3.5–5.3)
SODIUM: 142 meq/L (ref 135–145)
TOTAL PROTEIN: 6 g/dL (ref 6.0–8.3)

## 2013-09-17 LAB — CBC WITH DIFFERENTIAL/PLATELET
BASOS ABS: 0 10*3/uL (ref 0.0–0.1)
BASOS PCT: 0 % (ref 0–1)
EOS ABS: 0.3 10*3/uL (ref 0.0–0.7)
EOS PCT: 4 % (ref 0–5)
HCT: 50.2 % (ref 39.0–52.0)
Hemoglobin: 17.5 g/dL — ABNORMAL HIGH (ref 13.0–17.0)
Lymphocytes Relative: 31 % (ref 12–46)
Lymphs Abs: 2.2 10*3/uL (ref 0.7–4.0)
MCH: 34.3 pg — AB (ref 26.0–34.0)
MCHC: 34.9 g/dL (ref 30.0–36.0)
MCV: 98.4 fL (ref 78.0–100.0)
Monocytes Absolute: 0.5 10*3/uL (ref 0.1–1.0)
Monocytes Relative: 7 % (ref 3–12)
NEUTROS PCT: 58 % (ref 43–77)
Neutro Abs: 4.1 10*3/uL (ref 1.7–7.7)
PLATELETS: 185 10*3/uL (ref 150–400)
RBC: 5.1 MIL/uL (ref 4.22–5.81)
RDW: 13.7 % (ref 11.5–15.5)
WBC: 7.1 10*3/uL (ref 4.0–10.5)

## 2013-09-17 LAB — PSA, MEDICARE: PSA: 0.84 ng/mL (ref <=4.00)

## 2013-09-17 NOTE — Telephone Encounter (Signed)
Pt wants results of Cologuard test. Explained I will have Hope Pigeon, CMA call him tomorrow; pt stated understanding. Claiborne Billings, can you call pt? Thanks.

## 2013-09-17 NOTE — Telephone Encounter (Signed)
I called [facility] to get lab result  Per Waunita Schooner at [facility] the results were negative   I called patient back and advised that test result was negative  Patient verbalized understanding

## 2013-09-20 ENCOUNTER — Other Ambulatory Visit: Payer: Self-pay | Admitting: *Deleted

## 2013-09-20 MED ORDER — LANSOPRAZOLE 30 MG PO CPDR: 30.0000 mg | DELAYED_RELEASE_CAPSULE | Freq: Every day | ORAL | Status: DC

## 2013-09-21 ENCOUNTER — Encounter: Payer: Self-pay | Admitting: Family Medicine

## 2013-09-21 ENCOUNTER — Ambulatory Visit (INDEPENDENT_AMBULATORY_CARE_PROVIDER_SITE_OTHER): Payer: Medicare Other | Admitting: Family Medicine

## 2013-09-21 VITALS — BP 100/64 | HR 62 | Temp 97.4°F | Resp 20 | Ht 66.5 in | Wt 138.0 lb

## 2013-09-21 DIAGNOSIS — Z0000 Encounter for general adult medical examination without abnormal findings: Secondary | ICD-10-CM

## 2013-09-21 NOTE — Progress Notes (Signed)
Subjective:    Patient ID: Bruce Travis, male    DOB: Jul 25, 1935, 79 y.o.   MRN: [id number]  HPI. Patient is a very pleasant 79 year old white male who comes in today for complete physical exam. He has a significant past medical history of COPD, throat cancer, and pulmonary nodules. Primary nodules were last checked on CT scan of the chest performed in July of 2014. This showed stable appearance from 1 in 1 year. Our current plan is repeat annual chest x-rays as long as the patient is asymptomatic. He is next due for chest x-ray in July of 2015.  The patient's tetanus shot was in 2010. His last colonoscopy was in 2008.  He is followed by Dr. Sharlett Iles for his celiac disease.  He is doing well since consuming a gluten-free diet.  The patient has had Pneumovax 23. He has had Prevnar 13. He is also has zoster back. He has had his annual flu shot. Past Medical History  Diagnosis Date  . Personal history of colonic polyps 04/26/2007    hyperplastic   . Diverticulosis of colon (without mention of hemorrhage)   . Family hx of colon cancer   . COPD (chronic obstructive pulmonary disease)   . Esophageal reflux   . Hypertension   . Hyperlipemia   . CAD (coronary artery disease)   . Throat cancer   . Other diseases of lung, not elsewhere classified   . Unspecified asthma(493.90)   . Nephrolithiasis   . Allergic rhinitis   . Bronchiectasis   . Celiac disease    Past Surgical History  Procedure Laterality Date  . Vasectomy    . Head and neck surgery    . 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 Hemorrhoid surgery    . Hand surgery      d/t crush injury, right  . Tonsillectomy and adenoidectomy     Current Outpatient Prescriptions on File Prior to Visit  Medication Sig Dispense Refill  . albuterol (PROVENTIL HFA;VENTOLIN HFA) 108 (90 BASE) MCG/ACT inhaler Inhale 2 puffs into the lungs every 4 (four) hours as needed. For shortness of breath  1 Inhaler  3  . AMBULATORY NON FORMULARY MEDICATION  O2 2 Lpm at night      . aspirin 81 MG tablet Take 81 mg by mouth daily.       Marland Kitchen atorvastatin (LIPITOR) 40 MG tablet TAKE ONE TABLET BY MOUTH AT BEDTIME.  30 tablet  5  . famotidine (PEPCID) 20 MG tablet Take 20 mg by mouth at bedtime.      . fluticasone (FLONASE) 50 MCG/ACT nasal spray Place 2 sprays into the nose daily.  16 g  6  . lansoprazole (PREVACID) 30 MG capsule Take 1 capsule (30 mg total) by mouth daily.  30 capsule  6  . montelukast (SINGULAIR) 10 MG tablet TAKE 1 TABLET BY MOUTH DAILY.  30 tablet  PRN  . SPIRIVA HANDIHALER 18 MCG inhalation capsule INHALE 1 CAPSULE ONCE DAILY AS DIRECTED  30 capsule  PRN  . SYMBICORT 160-4.5 MCG/ACT inhaler INHALE 2 PUFFS FIRST THING IN MORNING AND THEN ANOTHER 2 PUFFS ABOUT 12 HOURS LATER.  10.2 g  PRN  . valsartan (DIOVAN) 80 MG tablet TAKE ONE TABLET BY MOUTH DAILY.  30 tablet  5   No current facility-administered medications on file prior to visit.   Allergies  Allergen Reactions  . Codeine Other (See Comments)    Thought head was going to  blow off   History   Social History  . Marital Status: Married    Spouse Name: N/A    Number of Children: 3  . Years of Education: N/A   Occupational History  . retired     Psychologist, sport and exercise and Administrator   Social History Main Topics  . Smoking status: Former Smoker -- 2.00 packs/day for 35 years    Types: Cigarettes    Quit date: 01/21/1989  . Smokeless tobacco: Former Systems developer    Types: Chew    Quit date: 08/23/2001  . Alcohol Use: No  . Drug Use: No  . Sexual Activity: Not on file   Other Topics Concern  . Not on file   Social History Narrative  . No narrative on file   Family History  Problem Relation Age of Onset  . Heart disease Father   . Colon cancer Father   . Prostate cancer Father   . Stroke Mother   . Endometrial cancer Sister       Review of Systems  All other systems reviewed and are negative.       Objective:   Physical Exam  Vitals reviewed. Constitutional: He  is oriented to person, place, and time. He appears well-developed and well-nourished. No distress.  HENT:  Head: Normocephalic and atraumatic.  Right Ear: External ear normal.  Left Ear: External ear normal.  Nose: Nose normal.  Mouth/Throat: Oropharynx is clear and moist. No oropharyngeal exudate.  Eyes: Conjunctivae and EOM are normal. Pupils are equal, round, and reactive to light. Right eye exhibits no discharge. Left eye exhibits no discharge. No scleral icterus.  Neck: Normal range of motion. Neck supple. No JVD present. No tracheal deviation present. No thyromegaly present.  Cardiovascular: Normal rate, regular rhythm, normal heart sounds and intact distal pulses.  Exam reveals no gallop and no friction rub.   No murmur heard. Pulmonary/Chest: Effort normal and breath sounds normal. No stridor. No respiratory distress. He has no wheezes. He has no rales. He exhibits no tenderness.  Abdominal: Soft. Bowel sounds are normal. He exhibits no distension and no mass. There is no tenderness. There is no rebound and no guarding.  Genitourinary: Rectum normal, prostate normal and penis normal.  Musculoskeletal: Normal range of motion. He exhibits no edema and no tenderness.  Lymphadenopathy:    He has no cervical adenopathy.  Neurological: He is alert and oriented to person, place, and time. He has normal reflexes. He displays normal reflexes. No cranial nerve deficit. He exhibits normal muscle tone. Coordination normal.  Skin: Skin is warm. No rash noted. He is not diaphoretic. No erythema. No pallor.  Psychiatric: He has a normal mood and affect. His behavior is normal. Judgment and thought content normal.          Assessment & Plan:  1. Routine general medical examination at a health care facility Patient's most recent lab work is listed below: Lab on 09/17/2013  Component Date Value Range Status  . WBC 09/17/2013 7.1  4.0 - 10.5 K/uL Final  . RBC 09/17/2013 5.10  4.22 - 5.81 MIL/uL  Final  . Hemoglobin 09/17/2013 17.5* 13.0 - 17.0 g/dL Final  . HCT 09/17/2013 50.2  39.0 - 52.0 % Final  . MCV 09/17/2013 98.4  78.0 - 100.0 fL Final  . MCH 09/17/2013 34.3* 26.0 - 34.0 pg Final  . MCHC 09/17/2013 34.9  30.0 - 36.0 g/dL Final  . RDW 09/17/2013 13.7  11.5 - 15.5 % Final  . Platelets  09/17/2013 185  150 - 400 K/uL Final  . Neutrophils Relative % 09/17/2013 58  43 - 77 % Final  . Neutro Abs 09/17/2013 4.1  1.7 - 7.7 K/uL Final  . Lymphocytes Relative 09/17/2013 31  12 - 46 % Final  . Lymphs Abs 09/17/2013 2.2  0.7 - 4.0 K/uL Final  . Monocytes Relative 09/17/2013 7  3 - 12 % Final  . Monocytes Absolute 09/17/2013 0.5  0.1 - 1.0 K/uL Final  . Eosinophils Relative 09/17/2013 4  0 - 5 % Final  . Eosinophils Absolute 09/17/2013 0.3  0.0 - 0.7 K/uL Final  . Basophils Relative 09/17/2013 0  0 - 1 % Final  . Basophils Absolute 09/17/2013 0.0  0.0 - 0.1 K/uL Final  . Smear Review 09/17/2013 Criteria for review not met   Final  . Sodium 09/17/2013 142  135 - 145 mEq/L Final  . Potassium 09/17/2013 4.7  3.5 - 5.3 mEq/L Final  . Chloride 09/17/2013 107  96 - 112 mEq/L Final  . CO2 09/17/2013 27  19 - 32 mEq/L Final  . Glucose, Bld 09/17/2013 92  70 - 99 mg/dL Final  . BUN 09/17/2013 19  6 - 23 mg/dL Final  . Creat 09/17/2013 1.23  0.50 - 1.35 mg/dL Final  . Total Bilirubin 09/17/2013 0.7  0.3 - 1.2 mg/dL Final  . Alkaline Phosphatase 09/17/2013 75  39 - 117 U/L Final  . AST 09/17/2013 19  0 - 37 U/L Final  . ALT 09/17/2013 28  0 - 53 U/L Final  . Total Protein 09/17/2013 6.0  6.0 - 8.3 g/dL Final  . Albumin 09/17/2013 4.1  3.5 - 5.2 g/dL Final  . Calcium 09/17/2013 9.6  8.4 - 10.5 mg/dL Final  . Cholesterol 09/17/2013 124  0 - 200 mg/dL Final   Comment: ATP III Classification:                                < 200        mg/dL        Desirable                               200 - 239     mg/dL        Borderline High                               >= 240        mg/dL        High                               . Triglycerides 09/17/2013 145  <150 mg/dL Final  . HDL 09/17/2013 34* >39 mg/dL Final  . Total CHOL/HDL Ratio 09/17/2013 3.6   Final  . VLDL 09/17/2013 29  0 - 40 mg/dL Final  . LDL Cholesterol 09/17/2013 61  0 - 99 mg/dL Final   Comment:                            Total Cholesterol/HDL Ratio:CHD Risk  Coronary Heart Disease Risk Table                                                                 Men       Women                                   1/2 Average Risk              3.4        3.3                                       Average Risk              5.0        4.4                                    2X Average Risk              9.6        7.1                                    3X Average Risk             23.4       11.0                          Use the calculated Patient Ratio above and the CHD Risk table                           to determine the patient's CHD Risk.                          ATP III Classification (LDL):                                < 100        mg/dL         Optimal                               100 - 129     mg/dL         Near or Above Optimal                               130 - 159     mg/dL         Borderline High                               160 - 189     mg/dL           High                                > 190        mg/dL         Very High                             . PSA 09/17/2013 0.84  <=4.00 ng/mL Final   Comment: Test Methodology: ECLIA PSA (Electrochemiluminescence Immunoassay)                                                     For PSA values from 2.5-4.0, particularly in younger men <60 years                          old, the AUA and NCCN suggest testing for % Free PSA (3515) and                          evaluation of the rate of increase in PSA (PSA velocity).  Appointment on 08/28/2013  Component Date Value Range Status  . IgA 08/28/2013 215  68 - 379 mg/dL Final  .  Endomysial Screen 08/28/2013 NEGATIVE  NEGATIVE Final  . Tissue Transglutaminase Ab, IgA 08/28/2013 6.2  <20 U/mL Final   Comment:    Reference Range:                                  <20     Negative                                  20-25   Equivocal                                  >25     Positive                                                     Between 2-3% of Celiac patients have selective IgA deficiency. If the                          tTG IgA result is negative but Celiac disease is still suspected,                          total IgA should be measured to identify possible selective IgA                          deficiency and to rule out a false negative. In cases of IgA                          deficiency, measurement of tTG IgG  should be considered.                             . Gliadin IgG 08/28/2013 3.6  <20 U/mL Final   Comment:    Reference Range:                                  <20     Negative                                  20-25   Equivocal                                  >25     Positive                             . Gliadin IgA 08/28/2013 7.4  <20 U/mL Final   Comment:    Reference Range:                                  <20     Negative                                  20-25   Equivocal                                  >25     Positive                             . Tissue Transglut Ab 08/28/2013 7.2  <20 U/mL Final   Comment:    Reference Range:                                  <20     Negative                                  20-25   Equivocal                                  >25     Positive                             . WBC 08/28/2013 6.9  4.5 - 10.5 K/uL Final  . RBC 08/28/2013 5.19  4.22 - 5.81 Mil/uL Final  . Hemoglobin 08/28/2013 17.3* 13.0 - 17.0 g/dL Final  . HCT 08/28/2013 51.3  39.0 - 52.0 % Final  . MCV 08/28/2013 98.7  78.0 - 100.0 fl Final  . MCHC 08/28/2013 33.7  30.0 - 36.0 g/dL Final  . RDW 08/28/2013 14.2  11.5 - 14.6 % Final  .  Platelets 08/28/2013 181.0  150.0 - 400.0 K/uL Final  .  Neutrophils Relative % 08/28/2013 59.1  43.0 - 77.0 % Final  . Lymphocytes Relative 08/28/2013 27.8  12.0 - 46.0 % Final  . Monocytes Relative 08/28/2013 9.1  3.0 - 12.0 % Final  . Eosinophils Relative 08/28/2013 3.7  0.0 - 5.0 % Final  . Basophils Relative 08/28/2013 0.3  0.0 - 3.0 % Final  . Neutro Abs 08/28/2013 4.1  1.4 - 7.7 K/uL Final  . Lymphs Abs 08/28/2013 1.9  0.7 - 4.0 K/uL Final  . Monocytes Absolute 08/28/2013 0.6  0.1 - 1.0 K/uL Final  . Eosinophils Absolute 08/28/2013 0.3  0.0 - 0.7 K/uL Final  . Basophils Absolute 08/28/2013 0.0  0.0 - 0.1 K/uL Final  . Vitamin B-12 08/28/2013 321  211 - 911 pg/mL Final  . Ferritin 08/28/2013 119.6  22.0 - 322.0 ng/mL Final  . Iron 08/28/2013 101  42 - 165 ug/dL Final  . Transferrin 08/28/2013 230.2  212.0 - 360.0 mg/dL Final  . Saturation Ratios 08/28/2013 31.3  20.0 - 50.0 % Final  . Folate 08/28/2013 9.8  >5.9 ng/mL Final   Her labs are significant for an LDL of 29 and HDL of 34. I question whether he needs a statin. His goal LDL is less than 100. I passed the patient to decrease his Lipitor to 20 mg a day. We'll recheck a fasting lipid panel and chest x-ray in July. If his LDL is still less than 100 discontinued Lipitor.  Otherwise his cancer screening is up to date. Immunizations are up-to-date. Follow up in July or as needed.

## 2013-10-22 ENCOUNTER — Encounter: Payer: Self-pay | Admitting: Family Medicine

## 2013-10-22 ENCOUNTER — Ambulatory Visit (INDEPENDENT_AMBULATORY_CARE_PROVIDER_SITE_OTHER): Payer: Medicare Other | Admitting: Family Medicine

## 2013-10-22 VITALS — BP 134/60 | HR 60 | Temp 97.5°F | Resp 22 | Ht 66.0 in | Wt 141.0 lb

## 2013-10-22 DIAGNOSIS — J441 Chronic obstructive pulmonary disease with (acute) exacerbation: Secondary | ICD-10-CM

## 2013-10-22 MED ORDER — ALBUTEROL SULFATE (2.5 MG/3ML) 0.083% IN NEBU: 2.5000 mg | INHALATION_SOLUTION | Freq: Four times a day (QID) | RESPIRATORY_TRACT | Status: DC | PRN

## 2013-10-22 MED ORDER — HYDROCODONE-HOMATROPINE 5-1.5 MG/5ML PO SYRP: 5.0000 mL | ORAL_SOLUTION | Freq: Four times a day (QID) | ORAL | Status: DC | PRN

## 2013-10-22 MED ORDER — METHYLPREDNISOLONE ACETATE 40 MG/ML IJ SUSP
40.0000 mg | Freq: Once | INTRAMUSCULAR | Status: AC
  Administered 2013-10-22: 40 mg via INTRAMUSCULAR

## 2013-10-22 MED ORDER — DOXYCYCLINE HYCLATE 100 MG PO TABS: 100.0000 mg | ORAL_TABLET | Freq: Two times a day (BID) | ORAL | Status: DC

## 2013-10-22 MED ORDER — PREDNISONE 10 MG PO TABS: ORAL_TABLET | ORAL | Status: DC

## 2013-10-22 NOTE — Progress Notes (Signed)
Patient ID: Bruce Travis, male   DOB: 09-05-1934, 79 y.o.   MRN: [id number]     Subjective:    Patient ID: Bruce Travis, male    DOB: 1935/03/31, 79 y.o.   MRN: [id number]  Patient presents for illness  patient here with cough with production and wheezing for the past 4 days. He has a history of COPD is currently on home oxygen. He states that his oxygen dropped to the mid 80s therefore he's been wearing it during the day although he does not have it with him right now. His cough is productive of yellow sputum and he's been using some Mucinex over-the-counter. He does have not be wrong inhaler but does not have any more nebulizer solution. He denies any chest pain.    Review Of Systems:  GEN- denies fatigue, fever, weight loss,weakness, recent illness HEENT- denies eye drainage, change in vision, nasal discharge, CVS- denies chest pain, palpitations RESP- + SOB, +cough, +wheeze Neuro- denies headache, dizziness, syncope, seizure activity       Objective:    BP 134/60  Pulse 60  Temp(Src) 97.5 F (36.4 C)  Resp 22  Ht 5' 6"  (1.676 m)  Wt 141 lb (63.957 kg)  BMI 22.77 kg/m2  SpO2 86% GEN- NAD, alert and oriented x3 HEENT- PERRL, EOMI, non injected sclera, pink conjunctiva, MMM, oropharynx clear Neck- Supple, No LAD, no retractions CVS- RRR, no murmur RESP- Scattered wheeze, fair air movement, mild rhonchi, normal WOB at rest, oxygen sat RA 86% EXT- No edema Pulses- Radial 2+        Assessment & Plan:      Problem List Items Addressed This Visit   COPD exacerbation - Primary     COPD exacerbation. He is quick to deteriorate based on his history. I've given him I am Solu-Medrol in the office he was started prednisone taper tomorrow he will also start doxycycline as well as Mucinex and use his nebulizer as needed. Return for any worsening symptoms or go to emergency room    Relevant Medications      HYDROcodone-homatropine (HYCODAN) 5-1.5 MG/5ML syrup      predniSONE  (DELTASONE) tablet      albuterol (PROVENTIL,VENTOLIN) nebulizer solution 2.5 mg/3 mL      methylPREDNISolone acetate (DEPO-MEDROL) injection 40 mg (Completed)      Note: This dictation was prepared with Dragon dictation along with smaller phrase technology. Any transcriptional errors that result from this process are unintentional.

## 2013-10-22 NOTE — Assessment & Plan Note (Signed)
COPD exacerbation. He is quick to deteriorate based on his history. I've given him I am Solu-Medrol in the office he was started prednisone taper tomorrow he will also start doxycycline as well as Mucinex and use his nebulizer as needed. Return for any worsening symptoms or go to emergency room

## 2013-10-22 NOTE — Patient Instructions (Signed)
Take prednisone starting tomorrow Wear your oxygen during the day Start antibotics Cough medicine given ALbuterol nebulizer as needed F/u as needed

## 2013-10-30 ENCOUNTER — Encounter: Payer: Self-pay | Admitting: Family Medicine

## 2013-10-30 ENCOUNTER — Ambulatory Visit (INDEPENDENT_AMBULATORY_CARE_PROVIDER_SITE_OTHER): Payer: Medicare Other | Admitting: Family Medicine

## 2013-10-30 ENCOUNTER — Ambulatory Visit
Admission: RE | Admit: 2013-10-30 | Discharge: 2013-10-30 | Disposition: A | Discharge status: Home / Self Care | Payer: 59 | Source: Ambulatory Visit | Attending: Family Medicine | Admitting: Family Medicine

## 2013-10-30 VITALS — BP 142/80 | HR 70 | Temp 97.9°F | Resp 22 | Ht 66.0 in | Wt 136.0 lb

## 2013-10-30 DIAGNOSIS — R05 Cough: Secondary | ICD-10-CM

## 2013-10-30 DIAGNOSIS — J441 Chronic obstructive pulmonary disease with (acute) exacerbation: Secondary | ICD-10-CM

## 2013-10-30 DIAGNOSIS — R059 Cough, unspecified: Secondary | ICD-10-CM

## 2013-10-30 MED ORDER — GUAIFENESIN ER 600 MG PO TB12: 600.0000 mg | ORAL_TABLET | Freq: Two times a day (BID) | ORAL | Status: DC

## 2013-10-30 MED ORDER — HYDROCODONE-ACETAMINOPHEN 5-325 MG PO TABS
1.0000 | ORAL_TABLET | Freq: Four times a day (QID) | ORAL | Status: DC | PRN
Start: 2013-10-30 — End: 2014-07-08

## 2013-10-30 NOTE — Progress Notes (Signed)
Patient ID: Bruce Travis, male   DOB: 03/03/1935, 79 y.o.   MRN: [id number]   Subjective:    Patient ID: Bruce Travis, male    DOB: Mar 15, 1935, 79 y.o.   MRN: [id number]  Patient presents for illness  patient here to followup COPD exacerbation. He was treated about a week ago for a COPD. He states that his wheezing has improved and his oxygen levels have been better but he still has cough with a lot of thick sputum. He's currently taking his antibiotic and also completed prednisone this morning. He has been using his nebulizer as prescribed. He denies any chest pain or fever    Review Of Systems:  GEN- denies fatigue, fever, weight loss,weakness, recent illness HEENT- denies eye drainage, change in vision, nasal discharge, CVS- denies chest pain, palpitations RESP- denies SOB, +cough,+ wheeze Neuro- denies headache, dizziness, syncope, seizure activity       Objective:    BP 142/80  Pulse 70  Temp(Src) 97.9 F (36.6 C)  Resp 22  Ht 5' 6"  (1.676 m)  Wt 136 lb (61.689 kg)  BMI 21.96 kg/m2  SpO2 93% GEN- NAD, alert and oriented x3 HEENT- PERRL, EOMI, non injected sclera, pink conjunctiva, MMM, oropharynx clear, no maxillary sinus tenderness Neck- Supple, No LAD, no retractions CVS- RRR, no murmur RESP- no wheeze, fairly good air movement, + rhonchi right base, normal WOB at rest, oxygen sat RA 93% EXT- No edema Pulses- Radial 2+         Assessment & Plan:      Problem List Items Addressed This Visit   None    Visit Diagnoses   Cough    -  Primary    Relevant Orders       DG Chest 2 View (Completed)       Note: This dictation was prepared with Dragon dictation along with smaller phrase technology. Any transcriptional errors that result from this process are unintentional.

## 2013-10-30 NOTE — Assessment & Plan Note (Signed)
He has improved some and his oxygen levels looked better today. I will have him add Mucinex he is getting a lot of soreness in his chest from coughing therefore will switch her to a hydrocodone tablet to help with cough. We'll also get a chest x-ray to rule out pneumonia  No chest x-ray showed COPD changes and congestion no pneumonia

## 2013-10-30 NOTE — Patient Instructions (Addendum)
Start mucinex twice a day Take new pain medication Get the xray done I will call with results

## 2013-11-08 ENCOUNTER — Other Ambulatory Visit: Payer: Self-pay | Admitting: Family Medicine

## 2013-11-08 NOTE — Telephone Encounter (Signed)
Refill appropriate and filled per protocol. 

## 2013-11-13 ENCOUNTER — Other Ambulatory Visit: Payer: Self-pay | Admitting: Family Medicine

## 2013-11-13 DIAGNOSIS — I10 Essential (primary) hypertension: Secondary | ICD-10-CM

## 2013-11-13 NOTE — Telephone Encounter (Signed)
Medication refilled per protocol. 

## 2013-11-16 ENCOUNTER — Encounter: Payer: Self-pay | Admitting: Family Medicine

## 2013-11-16 ENCOUNTER — Ambulatory Visit (INDEPENDENT_AMBULATORY_CARE_PROVIDER_SITE_OTHER): Payer: Medicare Other | Admitting: Family Medicine

## 2013-11-16 VITALS — BP 104/62 | HR 60 | Temp 96.9°F | Resp 24 | Ht 68.5 in | Wt 138.0 lb

## 2013-11-16 DIAGNOSIS — K2990 Gastroduodenitis, unspecified, without bleeding: Secondary | ICD-10-CM

## 2013-11-16 DIAGNOSIS — K2970 Gastritis, unspecified, without bleeding: Secondary | ICD-10-CM

## 2013-11-16 MED ORDER — SUCRALFATE 1 G PO TABS: 1.0000 g | ORAL_TABLET | Freq: Three times a day (TID) | ORAL | Status: DC

## 2013-11-16 NOTE — Progress Notes (Signed)
Subjective:    Patient ID: Bruce Travis, male    DOB: 1934/09/26, 79 y.o.   MRN: [id number]  HPI Patient is currently on Pepcid and Prevacid for refractory GERD. Over the last 3 weeks ever since he took an antibiotic he is had persistent unrelenting burning sensation in his epigastric area. It is worse with food. It is worse when he is supine. He denies any melena or hematochezia. He denies any fevers or weight loss. He denies any chest pain or shortness of breath he on his baseline with his severe COPD. He denies any diarrhea nausea or vomiting or fevers.  I have a low suspicion for C. Difficile. Past Medical History  Diagnosis Date  . Personal history of colonic polyps 04/26/2007    hyperplastic   . Diverticulosis of colon (without mention of hemorrhage)   . Family hx of colon cancer   . COPD (chronic obstructive pulmonary disease)   . Esophageal reflux   . Hypertension   . Hyperlipemia   . CAD (coronary artery disease)   . Throat cancer   . Other diseases of lung, not elsewhere classified   . Unspecified asthma(493.90)   . Nephrolithiasis   . Allergic rhinitis   . Bronchiectasis   . Celiac disease    Current Outpatient Prescriptions on File Prior to Visit  Medication Sig Dispense Refill  . albuterol (PROVENTIL HFA;VENTOLIN HFA) 108 (90 BASE) MCG/ACT inhaler Inhale 2 puffs into the lungs every 4 (four) hours as needed. For shortness of breath  1 Inhaler  3  . albuterol (PROVENTIL) (2.5 MG/3ML) 0.083% nebulizer solution Take 3 mLs (2.5 mg total) by nebulization every 6 (six) hours as needed for wheezing or shortness of breath.  150 mL  1  . AMBULATORY NON FORMULARY MEDICATION O2 2 Lpm at night      . aspirin 81 MG tablet Take 81 mg by mouth daily.       Marland Kitchen atorvastatin (LIPITOR) 40 MG tablet TAKE ONE TABLET BY MOUTH AT BEDTIME.  30 tablet  6  . famotidine (PEPCID) 20 MG tablet Take 20 mg by mouth at bedtime.      . fluticasone (FLONASE) 50 MCG/ACT nasal spray Place 2 sprays into the  nose daily.  16 g  6  . guaiFENesin (MUCINEX) 600 MG 12 hr tablet Take 1 tablet (600 mg total) by mouth 2 (two) times daily.  60 tablet  6  . HYDROcodone-acetaminophen (NORCO) 5-325 MG per tablet Take 1 tablet by mouth every 6 (six) hours as needed for moderate pain.  30 tablet  0  . lansoprazole (PREVACID) 30 MG capsule Take 1 capsule (30 mg total) by mouth daily.  30 capsule  6  . montelukast (SINGULAIR) 10 MG tablet TAKE 1 TABLET BY MOUTH DAILY.  30 tablet  PRN  . SPIRIVA HANDIHALER 18 MCG inhalation capsule INHALE 1 CAPSULE ONCE DAILY AS DIRECTED  30 capsule  PRN  . SYMBICORT 160-4.5 MCG/ACT inhaler INHALE 2 PUFFS FIRST THING IN MORNING AND THEN ANOTHER 2 PUFFS ABOUT 12 HOURS LATER.  10.2 g  PRN  . valsartan (DIOVAN) 80 MG tablet TAKE ONE TABLET BY MOUTH DAILY.  30 tablet  5   No current facility-administered medications on file prior to visit.   Allergies  Allergen Reactions  . Codeine Other (See Comments)    Thought head was going to blow off   History   Social History  . Marital Status: Married    Spouse Name: N/A  Number of Children: 3  . Years of Education: N/A   Occupational History  . retired     Psychologist, sport and exercise and Administrator   Social History Main Topics  . Smoking status: Former Smoker -- 2.00 packs/day for 35 years    Types: Cigarettes    Quit date: 01/21/1989  . Smokeless tobacco: Former Systems developer    Types: Chew    Quit date: 08/23/2001  . Alcohol Use: No  . Drug Use: No  . Sexual Activity: Not on file   Other Topics Concern  . Not on file   Social History Narrative  . No narrative on file      Review of Systems  All other systems reviewed and are negative.       Objective:   Physical Exam  Vitals reviewed. Constitutional: He appears well-developed and well-nourished. No distress.  Neck: Neck supple.  Cardiovascular: Normal rate, regular rhythm and normal heart sounds.  Exam reveals no gallop and no friction rub.   No murmur heard. Pulmonary/Chest:  Effort normal and breath sounds normal. No respiratory distress. He has no wheezes. He has no rales. He exhibits no tenderness.  Abdominal: Soft. Bowel sounds are normal. He exhibits no distension and no mass. There is no tenderness. There is no rebound and no guarding.  Lymphadenopathy:    He has no cervical adenopathy.  Skin: He is not diaphoretic.          Assessment & Plan:  1. Gastritis I am concerned that patient may be developing gastritis or possibly a small ulcer. I will have him discontinue Prevacid and replace it with dexilant  60 mg by mouth daily.  Also add sucralfate 1 g by mouth 4 times a day with meals recheck in 3 weeks or sooner if symptoms are worsening. If symptoms are worsening I recommend an EGD. - sucralfate (CARAFATE) 1 G tablet; Take 1 tablet (1 g total) by mouth 4 (four) times daily -  with meals and at bedtime.  Dispense: 120 tablet; Refill: 1

## 2013-12-07 ENCOUNTER — Encounter: Payer: Self-pay | Admitting: Family Medicine

## 2013-12-07 ENCOUNTER — Ambulatory Visit (INDEPENDENT_AMBULATORY_CARE_PROVIDER_SITE_OTHER): Payer: Medicare Other | Admitting: Family Medicine

## 2013-12-07 VITALS — BP 118/64 | HR 80 | Temp 97.4°F | Resp 20 | Ht 66.5 in | Wt 138.0 lb

## 2013-12-07 DIAGNOSIS — K2970 Gastritis, unspecified, without bleeding: Secondary | ICD-10-CM

## 2013-12-07 DIAGNOSIS — K2990 Gastroduodenitis, unspecified, without bleeding: Secondary | ICD-10-CM

## 2013-12-07 MED ORDER — DEXLANSOPRAZOLE 60 MG PO CPDR: 60.0000 mg | DELAYED_RELEASE_CAPSULE | Freq: Every day | ORAL | Status: DC

## 2013-12-07 NOTE — Progress Notes (Signed)
Subjective:    Patient ID: Bruce Travis, male    DOB: [birth year], 79 y.o.   MRN: [id number]  HPI 11/16/13 Patient is currently on Pepcid and Prevacid for refractory GERD. Over the last 3 weeks ever since he took an antibiotic he is had persistent unrelenting burning sensation in his epigastric area. It is worse with food. It is worse when he is supine. He denies any melena or hematochezia. He denies any fevers or weight loss. He denies any chest pain or shortness of breath he on his baseline with his severe COPD. He denies any diarrhea nausea or vomiting or fevers.  I have a low suspicion for C. Difficile.  At that time, my plan was: 1. Gastritis I am concerned that patient may be developing gastritis or possibly a small ulcer. I will have him discontinue Prevacid and replace it with dexilant  60 mg by mouth daily.  Also add sucralfate 1 g by mouth 4 times a day with meals recheck in 3 weeks or sooner if symptoms are worsening. If symptoms are worsening I recommend an EGD. - sucralfate (CARAFATE) 1 G tablet; Take 1 tablet (1 g total) by mouth 4 (four) times daily -  with meals and at bedtime.  Dispense: 120 tablet; Refill: 1 12/07/13 Patient is here today for a recheck .  His abdominal discomfort has finally stopped. He no longer has the burning epigastric pain. He denies any melena or hematochezia. He denies any weight loss or fevers. He denies any nausea or vomiting.   Past Medical History  Diagnosis Date  . Personal history of colonic polyps 04/26/2007    hyperplastic   . Diverticulosis of colon (without mention of hemorrhage)   . Family hx of colon cancer   . COPD (chronic obstructive pulmonary disease)   . Esophageal reflux   . Hypertension   . Hyperlipemia   . CAD (coronary artery disease)   . Throat cancer   . Other diseases of lung, not elsewhere classified   . Unspecified asthma(493.90)   . Nephrolithiasis   . Allergic rhinitis   . Bronchiectasis   . Celiac disease    Current  Outpatient Prescriptions on File Prior to Visit  Medication Sig Dispense Refill  . albuterol (PROVENTIL HFA;VENTOLIN HFA) 108 (90 BASE) MCG/ACT inhaler Inhale 2 puffs into the lungs every 4 (four) hours as needed. For shortness of breath  1 Inhaler  3  . albuterol (PROVENTIL) (2.5 MG/3ML) 0.083% nebulizer solution Take 3 mLs (2.5 mg total) by nebulization every 6 (six) hours as needed for wheezing or shortness of breath.  150 mL  1  . AMBULATORY NON FORMULARY MEDICATION O2 2 Lpm at night      . aspirin 81 MG tablet Take 81 mg by mouth daily.       Marland Kitchen atorvastatin (LIPITOR) 40 MG tablet TAKE ONE TABLET BY MOUTH AT BEDTIME.  30 tablet  6  . famotidine (PEPCID) 20 MG tablet Take 20 mg by mouth at bedtime.      . fluticasone (FLONASE) 50 MCG/ACT nasal spray Place 2 sprays into the nose daily.  16 g  6  . guaiFENesin (MUCINEX) 600 MG 12 hr tablet Take 1 tablet (600 mg total) by mouth 2 (two) times daily.  60 tablet  6  . HYDROcodone-acetaminophen (NORCO) 5-325 MG per tablet Take 1 tablet by mouth every 6 (six) hours as needed for moderate pain.  30 tablet  0  . montelukast (SINGULAIR) 10 MG tablet TAKE  1 TABLET BY MOUTH DAILY.  30 tablet  PRN  . SPIRIVA HANDIHALER 18 MCG inhalation capsule INHALE 1 CAPSULE ONCE DAILY AS DIRECTED  30 capsule  PRN  . sucralfate (CARAFATE) 1 G tablet Take 1 tablet (1 g total) by mouth 4 (four) times daily -  with meals and at bedtime.  120 tablet  1  . SYMBICORT 160-4.5 MCG/ACT inhaler INHALE 2 PUFFS FIRST THING IN MORNING AND THEN ANOTHER 2 PUFFS ABOUT 12 HOURS LATER.  10.2 g  PRN  . valsartan (DIOVAN) 80 MG tablet TAKE ONE TABLET BY MOUTH DAILY.  30 tablet  5   No current facility-administered medications on file prior to visit.   Allergies  Allergen Reactions  . Codeine Other (See Comments)    Thought head was going to blow off   History   Social History  . Marital Status: Married    Spouse Name: N/A    Number of Children: 3  . Years of Education: N/A    Occupational History  . retired     Psychologist, sport and exercise and Administrator   Social History Main Topics  . Smoking status: Former Smoker -- 2.00 packs/day for 35 years    Types: Cigarettes    Quit date: 01/21/1989  . Smokeless tobacco: Former Systems developer    Types: Chew    Quit date: 08/23/2001  . Alcohol Use: No  . Drug Use: No  . Sexual Activity: Not on file   Other Topics Concern  . Not on file   Social History Narrative  . No narrative on file      Review of Systems  All other systems reviewed and are negative.      Objective:   Physical Exam  Vitals reviewed. Constitutional: He appears well-developed and well-nourished. No distress.  Neck: Neck supple.  Cardiovascular: Normal rate, regular rhythm and normal heart sounds.  Exam reveals no gallop and no friction rub.   No murmur heard. Pulmonary/Chest: Effort normal and breath sounds normal. No respiratory distress. He has no wheezes. He has no rales. He exhibits no tenderness.  Abdominal: Soft. Bowel sounds are normal. He exhibits no distension and no mass. There is no tenderness. There is no rebound and no guarding.  Lymphadenopathy:    He has no cervical adenopathy.  Skin: He is not diaphoretic.          Assessment & Plan:   1. Gastritis Discontinue sucralfate.  Continue dexilant 60 mg poqday.  Patient has tried and failed a Metrazol, AcipHex, Prevacid, Pepcid, and combinations of Prevacid and Pepcid. Hopefully his insurance coming to see the necessity of him continuing dexilant.

## 2013-12-12 ENCOUNTER — Other Ambulatory Visit: Payer: Self-pay | Admitting: Dermatology

## 2014-01-07 ENCOUNTER — Other Ambulatory Visit: Payer: Self-pay | Admitting: Family Medicine

## 2014-01-07 DIAGNOSIS — Z79899 Other long term (current) drug therapy: Secondary | ICD-10-CM

## 2014-01-07 DIAGNOSIS — E785 Hyperlipidemia, unspecified: Secondary | ICD-10-CM

## 2014-01-07 DIAGNOSIS — I10 Essential (primary) hypertension: Secondary | ICD-10-CM

## 2014-01-15 ENCOUNTER — Other Ambulatory Visit: Payer: Medicare Other

## 2014-01-15 DIAGNOSIS — E785 Hyperlipidemia, unspecified: Secondary | ICD-10-CM

## 2014-01-15 DIAGNOSIS — I10 Essential (primary) hypertension: Secondary | ICD-10-CM

## 2014-01-15 DIAGNOSIS — Z79899 Other long term (current) drug therapy: Secondary | ICD-10-CM

## 2014-01-15 LAB — COMPREHENSIVE METABOLIC PANEL
ALBUMIN: 4 g/dL (ref 3.5–5.2)
ALK PHOS: 70 U/L (ref 39–117)
ALT: 22 U/L (ref 0–53)
AST: 18 U/L (ref 0–37)
BILIRUBIN TOTAL: 0.6 mg/dL (ref 0.2–1.2)
BUN: 23 mg/dL (ref 6–23)
CO2: 27 mEq/L (ref 19–32)
Calcium: 9 mg/dL (ref 8.4–10.5)
Chloride: 105 mEq/L (ref 96–112)
Creat: 1.14 mg/dL (ref 0.50–1.35)
GLUCOSE: 81 mg/dL (ref 70–99)
POTASSIUM: 4.5 meq/L (ref 3.5–5.3)
SODIUM: 141 meq/L (ref 135–145)
TOTAL PROTEIN: 5.7 g/dL — AB (ref 6.0–8.3)

## 2014-01-15 LAB — LIPID PANEL
Cholesterol: 135 mg/dL (ref 0–200)
HDL: 33 mg/dL — AB (ref >39)
LDL CALC: 55 mg/dL (ref 0–99)
TRIGLYCERIDES: 237 mg/dL — AB (ref <150)
Total CHOL/HDL Ratio: 4.1 Ratio
VLDL: 47 mg/dL — AB (ref 0–40)

## 2014-01-17 ENCOUNTER — Encounter: Payer: Self-pay | Admitting: *Deleted

## 2014-01-17 ENCOUNTER — Other Ambulatory Visit: Payer: Self-pay | Admitting: *Deleted

## 2014-01-17 MED ORDER — ATORVASTATIN CALCIUM 10 MG PO TABS: 10.0000 mg | ORAL_TABLET | Freq: Every day | ORAL | Status: DC

## 2014-02-04 ENCOUNTER — Other Ambulatory Visit: Payer: Self-pay | Admitting: Family Medicine

## 2014-02-15 ENCOUNTER — Other Ambulatory Visit: Payer: Self-pay | Admitting: Family Medicine

## 2014-02-15 NOTE — Telephone Encounter (Signed)
Medication refilled per protocol. 

## 2014-02-25 ENCOUNTER — Telehealth: Payer: Self-pay | Admitting: *Deleted

## 2014-02-25 NOTE — Telephone Encounter (Signed)
Pt called stating he is flying out to [facility] for 5 days and will not return until Sunday sometime, pt needs a RX for O2 concentrator  2L for sleep only sent to [place], if you cant reach him by home number please call cell.

## 2014-02-27 NOTE — Telephone Encounter (Signed)
[place]

## 2014-02-27 NOTE — Telephone Encounter (Signed)
Bruce Travis and he just need an order for a portable o2 concentrator with 2 L via nasal cannula - order faxed

## 2014-04-29 ENCOUNTER — Other Ambulatory Visit: Payer: Self-pay | Admitting: Family Medicine

## 2014-05-09 ENCOUNTER — Other Ambulatory Visit: Payer: Self-pay | Admitting: Family Medicine

## 2014-05-13 ENCOUNTER — Other Ambulatory Visit: Payer: Self-pay | Admitting: Family Medicine

## 2014-05-13 DIAGNOSIS — Z1211 Encounter for screening for malignant neoplasm of colon: Secondary | ICD-10-CM

## 2014-05-21 ENCOUNTER — Ambulatory Visit (INDEPENDENT_AMBULATORY_CARE_PROVIDER_SITE_OTHER): Payer: Medicare Other | Admitting: Family Medicine

## 2014-05-21 DIAGNOSIS — Z23 Encounter for immunization: Secondary | ICD-10-CM

## 2014-05-22 ENCOUNTER — Other Ambulatory Visit: Payer: Self-pay | Admitting: Family Medicine

## 2014-05-22 ENCOUNTER — Ambulatory Visit
Admission: RE | Admit: 2014-05-22 | Discharge: 2014-05-22 | Disposition: A | Discharge status: Home / Self Care | Payer: No Typology Code available for payment source | Source: Ambulatory Visit | Attending: Family Medicine | Admitting: Family Medicine

## 2014-05-22 DIAGNOSIS — J438 Other emphysema: Secondary | ICD-10-CM

## 2014-06-12 ENCOUNTER — Encounter: Payer: Self-pay | Admitting: Family Medicine

## 2014-07-01 ENCOUNTER — Encounter: Payer: Self-pay | Admitting: Family Medicine

## 2014-07-01 ENCOUNTER — Ambulatory Visit (INDEPENDENT_AMBULATORY_CARE_PROVIDER_SITE_OTHER): Payer: Medicare Other | Admitting: Family Medicine

## 2014-07-01 VITALS — BP 100/64 | HR 84 | Temp 98.0°F | Resp 22 | Ht 66.5 in | Wt 136.0 lb

## 2014-07-01 DIAGNOSIS — J441 Chronic obstructive pulmonary disease with (acute) exacerbation: Secondary | ICD-10-CM

## 2014-07-01 MED ORDER — ALBUTEROL SULFATE HFA 108 (90 BASE) MCG/ACT IN AERS: 2.0000 | INHALATION_SPRAY | RESPIRATORY_TRACT | Status: DC | PRN

## 2014-07-01 MED ORDER — LEVOFLOXACIN 500 MG PO TABS: 500.0000 mg | ORAL_TABLET | Freq: Every day | ORAL | Status: DC

## 2014-07-01 MED ORDER — CEFTRIAXONE SODIUM 1 G IJ SOLR
1.0000 g | Freq: Once | INTRAMUSCULAR | Status: AC
  Administered 2014-07-01: 1 g via INTRAMUSCULAR

## 2014-07-01 MED ORDER — METHYLPREDNISOLONE ACETATE 80 MG/ML IJ SUSP
60.0000 mg | Freq: Once | INTRAMUSCULAR | Status: AC
  Administered 2014-07-01: 60 mg via INTRAMUSCULAR

## 2014-07-01 MED ORDER — PREDNISONE 20 MG PO TABS: 40.0000 mg | ORAL_TABLET | Freq: Every day | ORAL | Status: DC

## 2014-07-01 MED ORDER — ALBUTEROL SULFATE (2.5 MG/3ML) 0.083% IN NEBU
2.5000 mg | INHALATION_SOLUTION | Freq: Four times a day (QID) | RESPIRATORY_TRACT | Status: DC | PRN
Start: 2014-07-01 — End: 2014-08-29

## 2014-07-01 NOTE — Addendum Note (Signed)
Addended by: Shary Decamp B on: 07/01/2014 12:53 PM   Modules accepted: Orders

## 2014-07-01 NOTE — Progress Notes (Signed)
Subjective:    Patient ID: Bruce Travis, male    DOB: 1935-01-06, 79 y.o.   MRN: [id number]  HPI Patient is a 79 year old white male with a history of emphysema. Beginning Saturday he developed a cough productive of thick yellow brown sputum. He reports increasing shortness of breath. His oxygen saturation is 85% off oxygen. He denies any hemoptysis. He denies any chest pain. Past Medical History  Diagnosis Date  . Personal history of colonic polyps 04/26/2007    hyperplastic   . Diverticulosis of colon (without mention of hemorrhage)   . Family hx of colon cancer   . COPD (chronic obstructive pulmonary disease)   . Esophageal reflux   . Hypertension   . Hyperlipemia   . CAD (coronary artery disease)   . Throat cancer   . Other diseases of lung, not elsewhere classified   . Unspecified asthma(493.90)   . Nephrolithiasis   . Allergic rhinitis   . Bronchiectasis   . Celiac disease    Past Surgical History  Procedure Laterality Date  . Vasectomy    . Head and neck surgery    . 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 Hemorrhoid surgery    . Hand surgery      d/t crush injury, right  . Tonsillectomy and adenoidectomy     Current Outpatient Prescriptions on File Prior to Visit  Medication Sig Dispense Refill  . AMBULATORY NON FORMULARY MEDICATION O2 2 Lpm at night    . aspirin 81 MG tablet Take 81 mg by mouth daily.     Marland Kitchen atorvastatin (LIPITOR) 10 MG tablet Take 1 tablet (10 mg total) by mouth daily. 90 tablet 3  . Coenzyme Q10 100 MG capsule Take 200 mg by mouth daily.    Marland Kitchen dexlansoprazole (DEXILANT) 60 MG capsule Take 60 mg by mouth daily.    Marland Kitchen dexlansoprazole (DEXILANT) 60 MG capsule Take 1 capsule (60 mg total) by mouth daily. 30 capsule 5  . famotidine (PEPCID) 20 MG tablet Take 20 mg by mouth at bedtime.    . fluticasone (FLONASE) 50 MCG/ACT nasal spray INSTILL 2 SPRAYS INTO EACH NOSTRIL DAILY. 16 g 11  . guaiFENesin (MUCINEX) 600 MG 12 hr tablet Take 1 tablet (600 mg  total) by mouth 2 (two) times daily. 60 tablet 6  . HYDROcodone-acetaminophen (NORCO) 5-325 MG per tablet Take 1 tablet by mouth every 6 (six) hours as needed for moderate pain. 30 tablet 0  . montelukast (SINGULAIR) 10 MG tablet TAKE 1 TABLET BY MOUTH DAILY. 30 tablet PRN  . SPIRIVA HANDIHALER 18 MCG inhalation capsule INHALE 1 CAPSULE ONCE DAILY AS DIRECTED 30 capsule 11  . sucralfate (CARAFATE) 1 G tablet Take 1 tablet (1 g total) by mouth 4 (four) times daily -  with meals and at bedtime. 120 tablet 1  . SYMBICORT 160-4.5 MCG/ACT inhaler INHALE 2 PUFFS FIRST THING IN MORNING AND THEN ANOTHER 2 PUFFS ABOUT 12 HOURS LATER. 10.2 g 11  . valsartan (DIOVAN) 80 MG tablet TAKE ONE TABLET BY MOUTH DAILY. 90 tablet 3   No current facility-administered medications on file prior to visit.   Allergies  Allergen Reactions  . Codeine Other (See Comments)    Thought head was going to blow off   History   Social History  . Marital Status: Married    Spouse Name: N/A    Number of Children: 3  . Years of Education: N/A   Occupational History  .  retired     Psychologist, sport and exercise and Administrator   Social History Main Topics  . Smoking status: Former Smoker -- 2.00 packs/day for 35 years    Types: Cigarettes    Quit date: 01/21/1989  . Smokeless tobacco: Former Systems developer    Types: Chew    Quit date: 08/23/2001  . Alcohol Use: No  . Drug Use: No  . Sexual Activity: Not on file   Other Topics Concern  . Not on file   Social History Narrative      Review of Systems  All other systems reviewed and are negative.      Objective:   Physical Exam  Constitutional: He appears well-developed and well-nourished.  Cardiovascular: Normal rate, regular rhythm and normal heart sounds.   Pulmonary/Chest: He has decreased breath sounds. He has wheezes.  Vitals reviewed.         Assessment & Plan:  COPD exacerbation - Plan: predniSONE (DELTASONE) 20 MG tablet, levofloxacin (LEVAQUIN) 500 MG tablet,  albuterol (PROVENTIL HFA;VENTOLIN HFA) 108 (90 BASE) MCG/ACT inhaler  Patient is experiencing a COPD exacerbation. I gave him 1 g of Rocephin IM 1 and 60 mg of Depo-Medrol 1 in the office. I then want him to begin prednisone 40 mg a day for 7 days and Levaquin 500 mg a day for 7 days. He can use albuterol nebs every 4 hours as needed for wheezing. Recheck in 24 hours or immediately if worse.

## 2014-07-03 ENCOUNTER — Other Ambulatory Visit: Payer: Self-pay | Admitting: Family Medicine

## 2014-07-05 ENCOUNTER — Encounter (HOSPITAL_COMMUNITY): Payer: Self-pay | Admitting: Family Medicine

## 2014-07-05 ENCOUNTER — Ambulatory Visit (INDEPENDENT_AMBULATORY_CARE_PROVIDER_SITE_OTHER): Payer: Medicare Other | Admitting: Family Medicine

## 2014-07-05 ENCOUNTER — Inpatient Hospital Stay (HOSPITAL_COMMUNITY)
Admission: EM | Admit: 2014-07-05 | Discharge: 2014-07-08 | DRG: 190 | Disposition: A | Discharge status: Home / Self Care | Payer: Medicare Other | Attending: Internal Medicine | Admitting: Internal Medicine

## 2014-07-05 ENCOUNTER — Inpatient Hospital Stay (HOSPITAL_COMMUNITY): Payer: Medicare Other

## 2014-07-05 ENCOUNTER — Encounter: Payer: Self-pay | Admitting: Family Medicine

## 2014-07-05 ENCOUNTER — Emergency Department (HOSPITAL_COMMUNITY): Payer: Medicare Other

## 2014-07-05 VITALS — BP 110/70 | HR 86 | Temp 97.7°F | Resp 22

## 2014-07-05 DIAGNOSIS — Z87891 Personal history of nicotine dependence: Secondary | ICD-10-CM

## 2014-07-05 DIAGNOSIS — I2510 Atherosclerotic heart disease of native coronary artery without angina pectoris: Secondary | ICD-10-CM | POA: Diagnosis present

## 2014-07-05 DIAGNOSIS — Z7951 Long term (current) use of inhaled steroids: Secondary | ICD-10-CM

## 2014-07-05 DIAGNOSIS — Z7952 Long term (current) use of systemic steroids: Secondary | ICD-10-CM | POA: Diagnosis not present

## 2014-07-05 DIAGNOSIS — I10 Essential (primary) hypertension: Secondary | ICD-10-CM | POA: Diagnosis present

## 2014-07-05 DIAGNOSIS — J441 Chronic obstructive pulmonary disease with (acute) exacerbation: Secondary | ICD-10-CM

## 2014-07-05 DIAGNOSIS — R0600 Dyspnea, unspecified: Secondary | ICD-10-CM | POA: Diagnosis present

## 2014-07-05 DIAGNOSIS — E785 Hyperlipidemia, unspecified: Secondary | ICD-10-CM | POA: Diagnosis present

## 2014-07-05 DIAGNOSIS — Z85819 Personal history of malignant neoplasm of unspecified site of lip, oral cavity, and pharynx: Secondary | ICD-10-CM | POA: Diagnosis not present

## 2014-07-05 DIAGNOSIS — J9601 Acute respiratory failure with hypoxia: Secondary | ICD-10-CM | POA: Diagnosis present

## 2014-07-05 DIAGNOSIS — J440 Chronic obstructive pulmonary disease with acute lower respiratory infection: Secondary | ICD-10-CM | POA: Diagnosis present

## 2014-07-05 DIAGNOSIS — Z7982 Long term (current) use of aspirin: Secondary | ICD-10-CM

## 2014-07-05 DIAGNOSIS — Z79899 Other long term (current) drug therapy: Secondary | ICD-10-CM

## 2014-07-05 DIAGNOSIS — K219 Gastro-esophageal reflux disease without esophagitis: Secondary | ICD-10-CM | POA: Diagnosis present

## 2014-07-05 DIAGNOSIS — J45901 Unspecified asthma with (acute) exacerbation: Secondary | ICD-10-CM | POA: Diagnosis present

## 2014-07-05 DIAGNOSIS — R0602 Shortness of breath: Secondary | ICD-10-CM

## 2014-07-05 LAB — CBC WITH DIFFERENTIAL/PLATELET
Basophils Absolute: 0 10*3/uL (ref 0.0–0.1)
Basophils Relative: 0 % (ref 0–1)
Eosinophils Absolute: 0 10*3/uL (ref 0.0–0.7)
Eosinophils Relative: 0 % (ref 0–5)
HCT: 48.9 % (ref 39.0–52.0)
Hemoglobin: 16.7 g/dL (ref 13.0–17.0)
Lymphocytes Relative: 9 % — ABNORMAL LOW (ref 12–46)
Lymphs Abs: 0.7 10*3/uL (ref 0.7–4.0)
MCH: 33.5 pg (ref 26.0–34.0)
MCHC: 34.2 g/dL (ref 30.0–36.0)
MCV: 98.2 fL (ref 78.0–100.0)
Monocytes Absolute: 0.7 10*3/uL (ref 0.1–1.0)
Monocytes Relative: 9 % (ref 3–12)
Neutro Abs: 6.4 10*3/uL (ref 1.7–7.7)
Neutrophils Relative %: 82 % — ABNORMAL HIGH (ref 43–77)
Platelets: 184 10*3/uL (ref 150–400)
RBC: 4.98 MIL/uL (ref 4.22–5.81)
RDW: 13.5 % (ref 11.5–15.5)
WBC: 7.8 10*3/uL (ref 4.0–10.5)

## 2014-07-05 LAB — TROPONIN I
Troponin I: <0.3 ng/mL (ref <0.30)
Troponin I: <0.3 ng/mL (ref <0.30)

## 2014-07-05 LAB — CBC
HCT: 45 % (ref 39.0–52.0)
Hemoglobin: 15.6 g/dL (ref 13.0–17.0)
MCH: 34.1 pg — ABNORMAL HIGH (ref 26.0–34.0)
MCHC: 34.7 g/dL (ref 30.0–36.0)
MCV: 98.5 fL (ref 78.0–100.0)
PLATELETS: 168 10*3/uL (ref 150–400)
RBC: 4.57 MIL/uL (ref 4.22–5.81)
RDW: 13.4 % (ref 11.5–15.5)
WBC: 5.1 10*3/uL (ref 4.0–10.5)

## 2014-07-05 LAB — COMPREHENSIVE METABOLIC PANEL
ALK PHOS: 64 U/L (ref 39–117)
ALT: 31 U/L (ref 0–53)
ANION GAP: 16 — AB (ref 5–15)
AST: 21 U/L (ref 0–37)
Albumin: 3.4 g/dL — ABNORMAL LOW (ref 3.5–5.2)
BILIRUBIN TOTAL: 0.5 mg/dL (ref 0.3–1.2)
BUN: 20 mg/dL (ref 6–23)
CO2: 21 mEq/L (ref 19–32)
Calcium: 9.5 mg/dL (ref 8.4–10.5)
Chloride: 105 mEq/L (ref 96–112)
Creatinine, Ser: 1.02 mg/dL (ref 0.50–1.35)
GFR calc non Af Amer: 69 mL/min — ABNORMAL LOW (ref >90)
GFR, EST AFRICAN AMERICAN: 80 mL/min — AB (ref >90)
Glucose, Bld: 95 mg/dL (ref 70–99)
POTASSIUM: 4.4 meq/L (ref 3.7–5.3)
Sodium: 142 mEq/L (ref 137–147)
TOTAL PROTEIN: 6.4 g/dL (ref 6.0–8.3)

## 2014-07-05 LAB — MAGNESIUM: MAGNESIUM: 2 mg/dL (ref 1.5–2.5)

## 2014-07-05 LAB — I-STAT TROPONIN, ED: TROPONIN I, POC: 0 ng/mL (ref 0.00–0.08)

## 2014-07-05 LAB — HEPATIC FUNCTION PANEL
ALT: 28 U/L (ref 0–53)
AST: 17 U/L (ref 0–37)
Albumin: 3.1 g/dL — ABNORMAL LOW (ref 3.5–5.2)
Alkaline Phosphatase: 56 U/L (ref 39–117)
BILIRUBIN TOTAL: 0.3 mg/dL (ref 0.3–1.2)
Bilirubin, Direct: <0.2 mg/dL (ref 0.0–0.3)
Total Protein: 5.7 g/dL — ABNORMAL LOW (ref 6.0–8.3)

## 2014-07-05 LAB — D-DIMER, QUANTITATIVE: D-Dimer, Quant: 0.99 ug/mL-FEU — ABNORMAL HIGH (ref 0.00–0.48)

## 2014-07-05 LAB — CREATININE, SERUM
CREATININE: 0.99 mg/dL (ref 0.50–1.35)
GFR calc Af Amer: 89 mL/min — ABNORMAL LOW (ref >90)
GFR calc non Af Amer: 77 mL/min — ABNORMAL LOW (ref >90)

## 2014-07-05 LAB — TSH: TSH: 0.371 u[IU]/mL (ref 0.350–4.500)

## 2014-07-05 LAB — I-STAT CG4 LACTIC ACID, ED: Lactic Acid, Venous: 2.47 mmol/L — ABNORMAL HIGH (ref 0.5–2.2)

## 2014-07-05 LAB — PRO B NATRIURETIC PEPTIDE: Pro B Natriuretic peptide (BNP): 210 pg/mL (ref 0–450)

## 2014-07-05 MED ORDER — ACETAMINOPHEN 650 MG RE SUPP: 650.0000 mg | Freq: Four times a day (QID) | RECTAL | Status: DC | PRN

## 2014-07-05 MED ORDER — IOHEXOL 350 MG/ML SOLN
75.0000 mL | Freq: Once | INTRAVENOUS | Status: AC | PRN
  Administered 2014-07-05: 75 mL via INTRAVENOUS

## 2014-07-05 MED ORDER — ASPIRIN 81 MG PO CHEW
81.0000 mg | CHEWABLE_TABLET | Freq: Every day | ORAL | Status: DC
  Administered 2014-07-06 – 2014-07-08 (×3): 81 mg via ORAL
  Filled 2014-07-05 (×6): qty 1

## 2014-07-05 MED ORDER — FAMOTIDINE 20 MG PO TABS
20.0000 mg | ORAL_TABLET | Freq: Every day | ORAL | Status: DC
  Administered 2014-07-05 – 2014-07-07 (×3): 20 mg via ORAL
  Filled 2014-07-05 (×4): qty 1

## 2014-07-05 MED ORDER — ACETAMINOPHEN 325 MG PO TABS: 650.0000 mg | ORAL_TABLET | Freq: Four times a day (QID) | ORAL | Status: DC | PRN

## 2014-07-05 MED ORDER — ATORVASTATIN CALCIUM 10 MG PO TABS
10.0000 mg | ORAL_TABLET | Freq: Every day | ORAL | Status: DC
  Administered 2014-07-05 – 2014-07-07 (×3): 10 mg via ORAL
  Filled 2014-07-05 (×3): qty 1

## 2014-07-05 MED ORDER — DOXYCYCLINE HYCLATE 100 MG PO TABS
100.0000 mg | ORAL_TABLET | Freq: Once | ORAL | Status: AC
  Administered 2014-07-05: 100 mg via ORAL
  Filled 2014-07-05: qty 1

## 2014-07-05 MED ORDER — ONDANSETRON HCL 4 MG PO TABS: 4.0000 mg | ORAL_TABLET | Freq: Four times a day (QID) | ORAL | Status: DC | PRN

## 2014-07-05 MED ORDER — LEVOFLOXACIN IN D5W 750 MG/150ML IV SOLN: 750.0000 mg | Freq: Once | INTRAVENOUS | Status: DC

## 2014-07-05 MED ORDER — SODIUM CHLORIDE 0.9 % IV SOLN
INTRAVENOUS | Status: DC
  Administered 2014-07-05 – 2014-07-06 (×3): via INTRAVENOUS

## 2014-07-05 MED ORDER — MONTELUKAST SODIUM 10 MG PO TABS
5.0000 mg | ORAL_TABLET | Freq: Every day | ORAL | Status: DC
  Administered 2014-07-05 – 2014-07-07 (×3): 5 mg via ORAL
  Filled 2014-07-05 (×3): qty 1

## 2014-07-05 MED ORDER — DEXTROSE 5 % IV SOLN: 1.0000 g | Freq: Once | INTRAVENOUS | Status: DC

## 2014-07-05 MED ORDER — HYDRALAZINE HCL 20 MG/ML IJ SOLN: 5.0000 mg | INTRAMUSCULAR | Status: DC | PRN

## 2014-07-05 MED ORDER — DEXTROSE 5 % IV SOLN
1.0000 g | Freq: Two times a day (BID) | INTRAVENOUS | Status: DC
  Administered 2014-07-05 – 2014-07-08 (×6): 1 g via INTRAVENOUS
  Filled 2014-07-05 (×8): qty 1

## 2014-07-05 MED ORDER — IPRATROPIUM-ALBUTEROL 0.5-2.5 (3) MG/3ML IN SOLN: 3.0000 mL | Freq: Once | RESPIRATORY_TRACT | Status: DC

## 2014-07-05 MED ORDER — LEVALBUTEROL HCL 0.63 MG/3ML IN NEBU
0.6300 mg | INHALATION_SOLUTION | Freq: Four times a day (QID) | RESPIRATORY_TRACT | Status: DC | PRN
  Filled 2014-07-05: qty 3

## 2014-07-05 MED ORDER — ALBUTEROL (5 MG/ML) CONTINUOUS INHALATION SOLN
10.0000 mg/h | INHALATION_SOLUTION | Freq: Once | RESPIRATORY_TRACT | Status: AC
  Administered 2014-07-05: 10 mg/h via RESPIRATORY_TRACT
  Filled 2014-07-05: qty 20

## 2014-07-05 MED ORDER — COENZYME Q10 100 MG PO CAPS: 200.0000 mg | ORAL_CAPSULE | Freq: Every day | ORAL | Status: DC

## 2014-07-05 MED ORDER — FLUTICASONE PROPIONATE 50 MCG/ACT NA SUSP
1.0000 | Freq: Every day | NASAL | Status: DC
  Administered 2014-07-06 – 2014-07-07 (×2): 1 via NASAL
  Filled 2014-07-05: qty 16

## 2014-07-05 MED ORDER — DEXTROSE 5 % IV SOLN
500.0000 mg | INTRAVENOUS | Status: DC
  Administered 2014-07-05: 500 mg via INTRAVENOUS
  Filled 2014-07-05 (×3): qty 500

## 2014-07-05 MED ORDER — SODIUM CHLORIDE 0.9 % IJ SOLN
3.0000 mL | Freq: Two times a day (BID) | INTRAMUSCULAR | Status: DC
  Administered 2014-07-05 – 2014-07-08 (×4): 3 mL via INTRAVENOUS

## 2014-07-05 MED ORDER — SODIUM CHLORIDE 0.9 % IV BOLUS (SEPSIS)
500.0000 mL | Freq: Once | INTRAVENOUS | Status: AC
  Administered 2014-07-05: 500 mL via INTRAVENOUS

## 2014-07-05 MED ORDER — HYDROCODONE-HOMATROPINE 5-1.5 MG/5ML PO SYRP
5.0000 mL | ORAL_SOLUTION | Freq: Four times a day (QID) | ORAL | Status: DC | PRN
  Administered 2014-07-06 – 2014-07-07 (×3): 5 mL via ORAL
  Filled 2014-07-05 (×3): qty 5

## 2014-07-05 MED ORDER — HYDROMORPHONE HCL 1 MG/ML IJ SOLN: 0.5000 mg | INTRAMUSCULAR | Status: DC | PRN

## 2014-07-05 MED ORDER — ONDANSETRON HCL 4 MG/2ML IJ SOLN: 4.0000 mg | Freq: Four times a day (QID) | INTRAMUSCULAR | Status: DC | PRN

## 2014-07-05 MED ORDER — METHYLPREDNISOLONE SODIUM SUCC 125 MG IJ SOLR
40.0000 mg | Freq: Two times a day (BID) | INTRAMUSCULAR | Status: DC
  Administered 2014-07-05 – 2014-07-08 (×6): 40 mg via INTRAVENOUS
  Filled 2014-07-05 (×6): qty 2

## 2014-07-05 MED ORDER — BUDESONIDE-FORMOTEROL FUMARATE 160-4.5 MCG/ACT IN AERO
2.0000 | INHALATION_SPRAY | Freq: Two times a day (BID) | RESPIRATORY_TRACT | Status: DC
  Administered 2014-07-05 – 2014-07-08 (×6): 2 via RESPIRATORY_TRACT
  Filled 2014-07-05: qty 6

## 2014-07-05 MED ORDER — METHYLPREDNISOLONE ACETATE 80 MG/ML IJ SUSP
80.0000 mg | Freq: Once | INTRAMUSCULAR | Status: AC
Start: 2014-07-05 — End: 2014-07-05
  Administered 2014-07-05: 80 mg via INTRAMUSCULAR

## 2014-07-05 MED ORDER — TIOTROPIUM BROMIDE MONOHYDRATE 18 MCG IN CAPS
18.0000 ug | ORAL_CAPSULE | Freq: Every day | RESPIRATORY_TRACT | Status: DC
  Administered 2014-07-06 – 2014-07-08 (×3): 18 ug via RESPIRATORY_TRACT
  Filled 2014-07-05: qty 5

## 2014-07-05 MED ORDER — ENOXAPARIN SODIUM 40 MG/0.4ML ~~LOC~~ SOLN
40.0000 mg | SUBCUTANEOUS | Status: DC
  Administered 2014-07-05 – 2014-07-07 (×3): 40 mg via SUBCUTANEOUS
  Filled 2014-07-05 (×4): qty 0.4

## 2014-07-05 MED ORDER — PANTOPRAZOLE SODIUM 40 MG PO TBEC
40.0000 mg | DELAYED_RELEASE_TABLET | Freq: Every day | ORAL | Status: DC
  Administered 2014-07-05 – 2014-07-08 (×4): 40 mg via ORAL
  Filled 2014-07-05 (×4): qty 1

## 2014-07-05 NOTE — H&P (Addendum)
[facility] History and Physical  Bruce Travis ULA:[id number] DOB: 1935-08-16 DOA: 07/05/2014  Referring physician: Shortness of breath  PCP: Odette Fraction, MD  . Chief Complaint:shortness of breath.  HPI:  79 year old white male with a history of emphysema. Beginning Saturday he developed a cough productive of thick yellow brown sputum and sore thorat on Monday morning . He reports increasing shortness of breath. His oxygen saturation is 85% off oxygen. He denies any hemoptysis. He denies any chest pain. Primary care provider on 11/9. Received 1 g Rocephin IM and started on  Steroids. Currently on prednisone 40 mg of levofloxacin 500 mg. Also using albuterol nebulizer every 4 hours. Saw his primary care provider again on 11/13 with worsening symptoms oxygen saturation 81% with labored breathing.He has markedly diminished breath sounds in all 4 lung quadrants and diffuse expiratory wheezing. Patient was given a DuoNeb immediately in the office at approximately 8:30 and started on 2 L via nasal cannula of oxygen. He has been compliant with his Levaquin and his prednisone. Patient was given 80 mg of Depo-Medrol at approximately 8:40. sent to the ED for further evaluation.  In the ER the patient's chest x-ray suspicious for by basilar interstitial pneumonitis. afebrile   Review of Systems: negative for the following  Constitutional: Denies fever, chills, diaphoresis, appetite change and fatigue.  HEENT: Denies photophobia, eye pain, redness, hearing loss, ear pain, congestion, sore throat, rhinorrhea, sneezing, mouth sores, trouble swallowing, neck pain, neck stiffness and tinnitus.  Respiratory: positive for SOB, DOE, cough, chest tightness, and wheezing.  Cardiovascular: Denies chest pain, palpitations and leg swelling.  Gastrointestinal: Denies nausea, vomiting, abdominal pain, diarrhea, constipation, blood in stool and abdominal distention.  Genitourinary: Denies dysuria, urgency,  frequency, hematuria, flank pain and difficulty urinating.  Musculoskeletal: Denies myalgias, back pain, joint swelling, arthralgias and gait problem.  Skin: Denies pallor, rash and wound.  Neurological: Denies dizziness, seizures, syncope, weakness, light-headedness, numbness and headaches.  Hematological: Denies adenopathy. Easy bruising, personal or family bleeding history  Psychiatric/Behavioral: Denies suicidal ideation, mood changes, confusion, nervousness, sleep disturbance and agitation       Past Medical History  Diagnosis Date  . Personal history of colonic polyps 04/26/2007    hyperplastic   . Diverticulosis of colon (without mention of hemorrhage)   . Family hx of colon cancer   . COPD (chronic obstructive pulmonary disease)   . Esophageal reflux   . Hypertension   . Hyperlipemia   . CAD (coronary artery disease)   . Throat cancer   . Other diseases of lung, not elsewhere classified   . Unspecified asthma(493.90)   . Nephrolithiasis   . Allergic rhinitis   . Bronchiectasis   . Celiac disease      Past Surgical History  Procedure Laterality Date  . Vasectomy    . Head and neck surgery    . 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 Hemorrhoid surgery    . Hand surgery      d/t crush injury, right  . Tonsillectomy and adenoidectomy        Social History:  reports that he quit smoking about 25 years ago. His smoking use included Cigarettes. He has a 70 pack-year smoking history. He quit smokeless tobacco use about 12 years ago. His smokeless tobacco use included Chew. He reports that he does not drink alcohol or use illicit drugs.    Allergies  Allergen Reactions  . Codeine Other (See Comments)    Thought  head was going to blow off  . Gluten Meal Other (See Comments)    Celiac disease    Family History  Problem Relation Age of Onset  . Heart disease Father   . Colon cancer Father   . Prostate cancer Father   . Stroke Mother   . Endometrial cancer  Sister      Prior to Admission medications   Medication Sig Start Date End Date Taking? Authorizing Provider  albuterol (PROVENTIL HFA;VENTOLIN HFA) 108 (90 BASE) MCG/ACT inhaler Inhale 2 puffs into the lungs every 4 (four) hours as needed. For shortness of breath 07/01/14  Yes Susy Frizzle, MD  albuterol (PROVENTIL) (2.5 MG/3ML) 0.083% nebulizer solution Take 3 mLs (2.5 mg total) by nebulization every 6 (six) hours as needed for wheezing or shortness of breath. 07/01/14  Yes Susy Frizzle, MD  AMBULATORY NON FORMULARY MEDICATION O2 2 Lpm at night   Yes Historical Provider, MD  aspirin 81 MG tablet Take 81 mg by mouth daily.    Yes Historical Provider, MD  atorvastatin (LIPITOR) 10 MG tablet Take 1 tablet (10 mg total) by mouth daily. 01/17/14  Yes Susy Frizzle, MD  Coenzyme Q10 100 MG capsule Take 200 mg by mouth daily.   Yes Historical Provider, MD  dexlansoprazole (DEXILANT) 60 MG capsule Take 1 capsule (60 mg total) by mouth daily. 12/07/13  Yes Susy Frizzle, MD  famotidine (PEPCID) 20 MG tablet Take 20 mg by mouth at bedtime.   Yes Historical Provider, MD  fluticasone (FLONASE) 50 MCG/ACT nasal spray INSTILL 2 SPRAYS INTO EACH NOSTRIL DAILY. 05/09/14  Yes Susy Frizzle, MD  HYDROcodone-homatropine [facility]) 5-1.5 MG/5ML syrup Take 5 mLs by mouth every 6 (six) hours as needed for cough.   Yes Historical Provider, MD  levofloxacin (LEVAQUIN) 500 MG tablet Take 1 tablet (500 mg total) by mouth daily. 07/01/14  Yes Susy Frizzle, MD  montelukast (SINGULAIR) 10 MG tablet TAKE 1 TABLET BY MOUTH DAILY. 07/10/13  Yes Susy Frizzle, MD  predniSONE (DELTASONE) 20 MG tablet Take 2 tablets (40 mg total) by mouth daily with breakfast. 07/01/14  Yes Susy Frizzle, MD  SPIRIVA HANDIHALER 18 MCG inhalation capsule INHALE 1 CAPSULE ONCE DAILY AS DIRECTED 02/15/14  Yes Susy Frizzle, MD  SYMBICORT 160-4.5 MCG/ACT inhaler INHALE 2 PUFFS FIRST THING IN MORNING AND THEN ANOTHER 2 PUFFS  ABOUT 12 HOURS LATER. 02/04/14  Yes Susy Frizzle, MD  valsartan (DIOVAN) 80 MG tablet TAKE ONE TABLET BY MOUTH DAILY. 04/30/14  Yes Susy Frizzle, MD  DEXILANT 60 MG capsule TAKE 1 CAPSULE BY MOUTH ONCE DAILY. Patient not taking: Reported on 07/05/2014 07/03/14   Susy Frizzle, MD  guaiFENesin (MUCINEX) 600 MG 12 hr tablet Take 1 tablet (600 mg total) by mouth 2 (two) times daily. Patient not taking: Reported on 07/05/2014 10/30/13   Alycia Rossetti, MD  HYDROcodone-acetaminophen [facility]) 5-325 MG per tablet Take 1 tablet by mouth every 6 (six) hours as needed for moderate pain. Patient not taking: Reported on 07/05/2014 10/30/13   Alycia Rossetti, MD  sucralfate (CARAFATE) 1 G tablet Take 1 tablet (1 g total) by mouth 4 (four) times daily -  with meals and at bedtime. Patient not taking: Reported on 07/05/2014 11/16/13   Susy Frizzle, MD     Physical Exam: Filed Vitals:   07/05/14 1200 07/05/14 1215 07/05/14 1230 07/05/14 1245  BP: 148/63 133/58 134/64 135/61  Pulse: 74 81 85  91  Temp:    97.9 F (36.6 C)  TempSrc:    Oral  Resp: 18 21 21 18   SpO2: 93% 92% 92% 91%     Constitutional: Vital signs reviewed. Patient is a well-developed and well-nourished in no acute distress and cooperative with exam. Alert and oriented x3.  Head: Normocephalic and atraumatic  Ear: TM normal bilaterally  Mouth: no erythema or exudates, MMM  Eyes: PERRL, EOMI, conjunctivae normal, No scleral icterus.  Neck: Supple, Trachea midline normal ROM, No JVD, mass, thyromegaly, or carotid bruit present.  Cardiovascular: RRR, S1 normal, S2 normal, no MRG, pulses symmetric and intact bilaterally  Pulmonary/Chest: He has decreased breath sounds. He has wheezes Abdominal: Soft. Non-tender, non-distended, bowel sounds are normal, no masses, organomegaly, or guarding present.  GU: no CVA tenderness Musculoskeletal: No joint deformities, erythema, or stiffness, ROM full and no nontender Ext: no edema and no  cyanosis, pulses palpable bilaterally (DP and PT)  Hematology: no cervical, inginal, or axillary adenopathy.  Neurological: A&O x3, Strenght is normal and symmetric bilaterally, cranial nerve II-XII are grossly intact, no focal motor deficit, sensory intact to light touch bilaterally.  Skin: Warm, dry and intact. No rash, cyanosis, or clubbing.  Psychiatric: Normal mood and affect. speech and behavior is normal. Judgment and thought content normal. Cognition and memory are normal.       Labs on Admission:    Basic Metabolic Panel:  Recent Labs Lab 07/05/14 1104  NA 142  K 4.4  CL 105  CO2 21  GLUCOSE 95  BUN 20  CREATININE 1.02  CALCIUM 9.5   Liver Function Tests:  Recent Labs Lab 07/05/14 1104  AST 21  ALT 31  ALKPHOS 64  BILITOT 0.5  PROT 6.4  ALBUMIN 3.4*   No results for input(s): LIPASE, AMYLASE in the last 168 hours. No results for input(s): AMMONIA in the last 168 hours. CBC:  Recent Labs Lab 07/05/14 1104  WBC 7.8  NEUTROABS 6.4  HGB 16.7  HCT 48.9  MCV 98.2  PLT 184   Cardiac Enzymes: No results for input(s): CKTOTAL, CKMB, CKMBINDEX, TROPONINI in the last 168 hours.  BNP (last 3 results)  Recent Labs  07/05/14 1104  PROBNP 210.0      CBG: No results for input(s): GLUCAP in the last 168 hours.  Radiological Exams on Admission: Dg Chest Portable 1 View  07/05/2014   CLINICAL DATA:  79 year old with dyspnea  EXAM: PORTABLE CHEST - 1 VIEW  COMPARISON:  05/22/2014  FINDINGS: The cardiac silhouette and mediastinal contours are within normal limits. Atherosclerotic aortic calcifications are present.  Upper lobe predominant emphysematous changes are present with associated mild architectural distortion. A calcified pulmonary nodule projects over the right lower lobe.  Interstitial opacities are noted in the lung bases. There is no pleural effusion. There is no pneumothorax.  There is no acute osseous abnormality.  IMPRESSION: 1. Upper lobe  predominant emphysematous changes. 2. Suspect acute bibasilar interstitial pneumonitis.   Electronically Signed   By: Rosemarie Ax   On: 07/05/2014 11:30    EKG: Independently reviewed.   Assessment/Plan Active Problems:   COPD exacerbation   Acute exacerbation of COPD with asthma  1. Acute hypoxemic respiratory failure: Secondary to COPD exacerbation, requiring 3 L of oxygen in the ED.obtain a d-dimer, if elevated will order a CT angiogram of the chest to rule out pulmonary embolism. Otherwise it will be attributed to his COPD exacerbation 2. COPD exacerbation: start patient on IV steroids as  he has failed outpatient therapy, continue oral levofloxacin, continue nebulizer treatments, supplemental oxygen. 3. GERD: PPI, Pepcid. 4. Allergic rhinitis: Continue Singulair. 5. Hypertension: hold Diovan. And start the patient on when necessary hydralazine 6. History of coronary artery disease: Stable. Continue aspirin, Lipitor.     Code Status:   full Family Communication: bedside Disposition Plan: admit   Time spent: 70 mins   Bruce Travis Pager [phone]  If 7PM-7AM, please contact night-coverage [email or web address] Password [email or web address] 07/05/2014, 1:25 PM

## 2014-07-05 NOTE — Progress Notes (Signed)
ANTIBIOTIC CONSULT NOTE - INITIAL  Pharmacy Consult for Cefepime Indication: COPD exacerbation  Allergies  Allergen Reactions  . Codeine Other (See Comments)    Thought head was going to blow off  . Gluten Meal Other (See Comments)    Celiac disease    Patient Measurements:   Wt: 61.7 kg Ht: 66.6 inches  Vital Signs: Temp: 97.9 F (36.6 C) (11/13 1245) Temp Source: Oral (11/13 1245) BP: 142/69 mmHg (11/13 1331) Pulse Rate: 97 (11/13 1331) Intake/Output from previous day:   Intake/Output from this shift: Total I/O In: 500 [I.V.:500] Out: 500 [Urine:500]  Labs:  Recent Labs  07/05/14 1104  WBC 7.8  HGB 16.7  PLT 184  CREATININE 1.02   Estimated Creatinine Clearance: 52.9 mL/min (by C-G formula based on Cr of 1.02). No results for input(s): VANCOTROUGH, VANCOPEAK, VANCORANDOM, GENTTROUGH, GENTPEAK, GENTRANDOM, TOBRATROUGH, TOBRAPEAK, TOBRARND, AMIKACINPEAK, AMIKACINTROU, AMIKACIN in the last 72 hours.   Microbiology: No results found for this or any previous visit (from the past 720 hour(s)).  Medical History: Past Medical History  Diagnosis Date  . Personal history of colonic polyps 04/26/2007    hyperplastic   . Diverticulosis of colon (without mention of hemorrhage)   . Family hx of colon cancer   . COPD (chronic obstructive pulmonary disease)   . Esophageal reflux   . Hypertension   . Hyperlipemia   . CAD (coronary artery disease)   . Throat cancer   . Other diseases of lung, not elsewhere classified   . Unspecified asthma(493.90)   . Nephrolithiasis   . Allergic rhinitis   . Bronchiectasis   . Celiac disease     Assessment: 19 YOM who presented to the [facility] on 11/13 with increasing SOB and yellow sputum after being seen by PCP on 11/9 and starting outpatient Levaquin + prednisone. Pharmacy has been consulted to start Cefepime along with Azithromycin per MD for COPD exacerbation coverage.   Goal of Therapy:  Proper antibiotics for  infection/cultures adjusted for renal/hepatic function   Plan:  1. Cefepime 1g IV every 12 hours 2. Will continue to follow renal function, culture results, LOT, and antibiotic de-escalation plans   Alycia Rossetti, PharmD, BCPS Clinical Pharmacist Pager: [phone] 07/05/2014 2:19 PM

## 2014-07-05 NOTE — ED Notes (Signed)
MD at bedside. 

## 2014-07-05 NOTE — Progress Notes (Signed)
Pt has Celiac disease. Order placed for Gluten Free Diet Alethea Terhaar V, RN

## 2014-07-05 NOTE — Progress Notes (Signed)
Subjective:    Patient ID: Bruce Travis, male    DOB: 06/19/1935, 79 y.o.   MRN: [id number]  Shortness of Breath  07/01/14 Patient is a 79 year old white male with a history of emphysema. Beginning Saturday he developed a cough productive of thick yellow brown sputum. He reports increasing shortness of breath. His oxygen saturation is 85% off oxygen. He denies any hemoptysis. He denies any chest pain.  At that time, my plan was: Patient is experiencing a COPD exacerbation. I gave him 1 g of Rocephin IM 1 and 60 mg of Depo-Medrol 1 in the office. I then want him to begin prednisone 40 mg a day for 7 days and Levaquin 500 mg a day for 7 days. He can use albuterol nebs every 4 hours as needed for wheezing. Recheck in 24 hours or immediately if worse.  07/05/14 Patient is worse. His oxygen saturation is 81%. He is labored breathing. He took an albuterol neb this morning at 7:00. He has markedly diminished breath sounds in all 4 lung quadrants and diffuse expiratory wheezing. Patient was given a DuoNeb immediately in the office at approximately 8:30 and started on 2 L via nasal cannula of oxygen. He has been compliant with his Levaquin and his prednisone.  Patient was given 80 mg of Depo-Medrol at approximately 8:40.   Past Medical History  Diagnosis Date  . Personal history of colonic polyps 04/26/2007    hyperplastic   . Diverticulosis of colon (without mention of hemorrhage)   . Family hx of colon cancer   . COPD (chronic obstructive pulmonary disease)   . Esophageal reflux   . Hypertension   . Hyperlipemia   . CAD (coronary artery disease)   . Throat cancer   . Other diseases of lung, not elsewhere classified   . Unspecified asthma(493.90)   . Nephrolithiasis   . Allergic rhinitis   . Bronchiectasis   . Celiac disease    Past Surgical History  Procedure Laterality Date  . Vasectomy    . Head and neck surgery    . 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 Hemorrhoid surgery    .  Hand surgery      d/t crush injury, right  . Tonsillectomy and adenoidectomy     Current Outpatient Prescriptions on File Prior to Visit  Medication Sig Dispense Refill  . albuterol (PROVENTIL HFA;VENTOLIN HFA) 108 (90 BASE) MCG/ACT inhaler Inhale 2 puffs into the lungs every 4 (four) hours as needed. For shortness of breath 1 Inhaler 3  . albuterol (PROVENTIL) (2.5 MG/3ML) 0.083% nebulizer solution Take 3 mLs (2.5 mg total) by nebulization every 6 (six) hours as needed for wheezing or shortness of breath. 150 mL 1  . AMBULATORY NON FORMULARY MEDICATION O2 2 Lpm at night    . aspirin 81 MG tablet Take 81 mg by mouth daily.     Marland Kitchen atorvastatin (LIPITOR) 10 MG tablet Take 1 tablet (10 mg total) by mouth daily. 90 tablet 3  . Coenzyme Q10 100 MG capsule Take 200 mg by mouth daily.    Marland Kitchen DEXILANT 60 MG capsule TAKE 1 CAPSULE BY MOUTH ONCE DAILY. 30 capsule 11  . dexlansoprazole (DEXILANT) 60 MG capsule Take 60 mg by mouth daily.    Marland Kitchen dexlansoprazole (DEXILANT) 60 MG capsule Take 1 capsule (60 mg total) by mouth daily. 30 capsule 5  . famotidine (PEPCID) 20 MG tablet Take 20 mg by mouth at bedtime.    Marland Kitchen  fluticasone (FLONASE) 50 MCG/ACT nasal spray INSTILL 2 SPRAYS INTO EACH NOSTRIL DAILY. 16 g 11  . guaiFENesin (MUCINEX) 600 MG 12 hr tablet Take 1 tablet (600 mg total) by mouth 2 (two) times daily. 60 tablet 6  . HYDROcodone-acetaminophen (NORCO) 5-325 MG per tablet Take 1 tablet by mouth every 6 (six) hours as needed for moderate pain. 30 tablet 0  . levofloxacin (LEVAQUIN) 500 MG tablet Take 1 tablet (500 mg total) by mouth daily. 7 tablet 0  . montelukast (SINGULAIR) 10 MG tablet TAKE 1 TABLET BY MOUTH DAILY. 30 tablet PRN  . predniSONE (DELTASONE) 20 MG tablet Take 2 tablets (40 mg total) by mouth daily with breakfast. 14 tablet 0  . SPIRIVA HANDIHALER 18 MCG inhalation capsule INHALE 1 CAPSULE ONCE DAILY AS DIRECTED 30 capsule 11  . sucralfate (CARAFATE) 1 G tablet Take 1 tablet (1 g total) by  mouth 4 (four) times daily -  with meals and at bedtime. 120 tablet 1  . SYMBICORT 160-4.5 MCG/ACT inhaler INHALE 2 PUFFS FIRST THING IN MORNING AND THEN ANOTHER 2 PUFFS ABOUT 12 HOURS LATER. 10.2 g 11  . valsartan (DIOVAN) 80 MG tablet TAKE ONE TABLET BY MOUTH DAILY. 90 tablet 3   No current facility-administered medications on file prior to visit.   Allergies  Allergen Reactions  . Codeine Other (See Comments)    Thought head was going to blow off   History   Social History  . Marital Status: Married    Spouse Name: N/A    Number of Children: 3  . Years of Education: N/A   Occupational History  . retired     Psychologist, sport and exercise and Administrator   Social History Main Topics  . Smoking status: Former Smoker -- 2.00 packs/day for 35 years    Types: Cigarettes    Quit date: 01/21/1989  . Smokeless tobacco: Former Systems developer    Types: Chew    Quit date: 08/23/2001  . Alcohol Use: No  . Drug Use: No  . Sexual Activity: Not on file   Other Topics Concern  . Not on file   Social History Narrative      Review of Systems  Respiratory: Positive for shortness of breath.   All other systems reviewed and are negative.      Objective:   Physical Exam  Constitutional: He appears well-developed and well-nourished.  Cardiovascular: Normal rate, regular rhythm and normal heart sounds.   Pulmonary/Chest: He has decreased breath sounds. He has wheezes.  Vitals reviewed.         Assessment & Plan:  COPD exacerbation  Patient was given a DuoNeb at 8:30. He was given 80 mg of Depo-Medrol at 8:40. He was started on oxygen 2 L via nasal cannula immediately upon entering the office.  On 2 L via nasal cannula his oxygen level came up to 93%. However the patient still has labored breathing and is having a difficult time talking.   Patient was reassessed at 9:15. His breathing is better but his oxygen saturation is still 92% on 2 L via nasal cannula. He is still somewhat labored in breathing. At  the present time I feel he needs high-dose steroids and frequent nebulizer treatments. Unfortunately the patient lives at home align with no one there to help care for him. He is having occasional PVCs auscultated on exam after the steroids and nebulizers. I am concerned about patient being home alone on such high-dose steroids and nebulizer treatment without someone there to  monitor him.  He has failed outpatient treatment with a fluoroquinolone and prednisone. I believe the patient would benefit from inpatient nebulizer therapies and IV steroids. Patient would also benefit from a chest x-ray to exclude pneumonia although I do not appreciate any crackles or rales on exam today. I recommended that the patient go to the ER.  He will have his daughter drive him to the emergency room for further evaluation. The emergency room was notified.

## 2014-07-05 NOTE — Addendum Note (Signed)
Addended by: Shary Decamp B on: 07/05/2014 09:30 AM   Modules accepted: Orders

## 2014-07-05 NOTE — ED Notes (Signed)
Per pt sts increased SOB. sts recently started on steroids and not getting better. sts COPD.

## 2014-07-05 NOTE — ED Notes (Signed)
Patient placed on the albuterol treatment. Waiting to give antibiotic at this moment. Remains on the cardiac monitor. Family at the bedside.

## 2014-07-05 NOTE — ED Notes (Signed)
Bruce Travis, Res MD aware of abnormal lab test results

## 2014-07-05 NOTE — ED Provider Notes (Signed)
CSN: [id number]     Arrival date & time 07/05/14  1028 History   First MD Initiated Contact with Patient 07/05/14 1043     Chief Complaint  Patient presents with  . Shortness of Breath    HPI Comments: 79 y.o. Male with a history of COPD presents with increased sputum production, shortness of breath, and new oxygen requirement. He has been on Levaquin and Prednisone at home for the last 4 days. Saw his PCP today for follow up, was doing worse, was sent to the ED for further evaluation and management. Received IM DepoMedrol at his PCP's office this morning.   Patient is a 79 y.o. male presenting with shortness of breath. The history is provided by the patient.  Shortness of Breath Severity:  Severe Onset quality:  Gradual Duration:  7 days Timing:  Constant Progression:  Unchanged Chronicity:  New Context: URI   Relieved by:  Oxygen and rest Worsened by:  Exertion Ineffective treatments:  Oxygen and rest (prednisone, Levaquin) Associated symptoms: cough and sputum production (increased)   Associated symptoms: no abdominal pain, no chest pain, no fever and no vomiting   Cough:    Cough characteristics:  Productive   Sputum characteristics:  Yellow   Severity:  Severe   Onset quality:  Gradual   Timing:  Constant   Progression:  Worsening   Chronicity:  New Risk factors: hx of cancer   Risk factors: no hx of PE/DVT and no recent surgery     Past Medical History  Diagnosis Date  . Personal history of colonic polyps 04/26/2007    hyperplastic   . Diverticulosis of colon (without mention of hemorrhage)   . Family hx of colon cancer   . COPD (chronic obstructive pulmonary disease)   . Esophageal reflux   . Hypertension   . Hyperlipemia   . CAD (coronary artery disease)   . Throat cancer   . Other diseases of lung, not elsewhere classified   . Unspecified asthma(493.90)   . Nephrolithiasis   . Allergic rhinitis   . Bronchiectasis   . Celiac disease    Past Surgical  History  Procedure Laterality Date  . Vasectomy    . Head and neck surgery    . 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 Hemorrhoid surgery    . Hand surgery      d/t crush injury, right  . Tonsillectomy and adenoidectomy     Family History  Problem Relation Age of Onset  . Heart disease Father   . Colon cancer Father   . Prostate cancer Father   . Stroke Mother   . Endometrial cancer Sister    History  Substance Use Topics  . Smoking status: Former Smoker -- 2.00 packs/day for 35 years    Types: Cigarettes    Quit date: 01/21/1989  . Smokeless tobacco: Former Systems developer    Types: Chew    Quit date: 08/23/2001  . Alcohol Use: No    Review of Systems  Constitutional: Negative for fever.  Respiratory: Positive for cough, sputum production (increased) and shortness of breath.   Cardiovascular: Negative for chest pain.  Gastrointestinal: Negative for vomiting and abdominal pain.  All other systems reviewed and are negative.   Allergies  Codeine  Home Medications   Prior to Admission medications   Medication Sig Start Date End Date Taking? Authorizing Provider  albuterol (PROVENTIL HFA;VENTOLIN HFA) 108 (90 BASE) MCG/ACT inhaler Inhale 2 puffs into the lungs  every 4 (four) hours as needed. For shortness of breath 07/01/14   Susy Frizzle, MD  albuterol (PROVENTIL) (2.5 MG/3ML) 0.083% nebulizer solution Take 3 mLs (2.5 mg total) by nebulization every 6 (six) hours as needed for wheezing or shortness of breath. 07/01/14   Susy Frizzle, MD  AMBULATORY NON FORMULARY MEDICATION O2 2 Lpm at night    Historical Provider, MD  aspirin 81 MG tablet Take 81 mg by mouth daily.     Historical Provider, MD  atorvastatin (LIPITOR) 10 MG tablet Take 1 tablet (10 mg total) by mouth daily. 01/17/14   Susy Frizzle, MD  Coenzyme Q10 100 MG capsule Take 200 mg by mouth daily.    Historical Provider, MD  DEXILANT 60 MG capsule TAKE 1 CAPSULE BY MOUTH ONCE DAILY. 07/03/14   Susy Frizzle, MD  dexlansoprazole (DEXILANT) 60 MG capsule Take 60 mg by mouth daily.    Historical Provider, MD  dexlansoprazole (DEXILANT) 60 MG capsule Take 1 capsule (60 mg total) by mouth daily. 12/07/13   Susy Frizzle, MD  famotidine (PEPCID) 20 MG tablet Take 20 mg by mouth at bedtime.    Historical Provider, MD  fluticasone (FLONASE) 50 MCG/ACT nasal spray INSTILL 2 SPRAYS INTO EACH NOSTRIL DAILY. 05/09/14   Susy Frizzle, MD  guaiFENesin (MUCINEX) 600 MG 12 hr tablet Take 1 tablet (600 mg total) by mouth 2 (two) times daily. 10/30/13   Alycia Rossetti, MD  HYDROcodone-acetaminophen (NORCO) 5-325 MG per tablet Take 1 tablet by mouth every 6 (six) hours as needed for moderate pain. 10/30/13   Alycia Rossetti, MD  levofloxacin (LEVAQUIN) 500 MG tablet Take 1 tablet (500 mg total) by mouth daily. 07/01/14   Susy Frizzle, MD  montelukast (SINGULAIR) 10 MG tablet TAKE 1 TABLET BY MOUTH DAILY. 07/10/13   Susy Frizzle, MD  predniSONE (DELTASONE) 20 MG tablet Take 2 tablets (40 mg total) by mouth daily with breakfast. 07/01/14   Susy Frizzle, MD  SPIRIVA HANDIHALER 18 MCG inhalation capsule INHALE 1 CAPSULE ONCE DAILY AS DIRECTED 02/15/14   Susy Frizzle, MD  sucralfate (CARAFATE) 1 G tablet Take 1 tablet (1 g total) by mouth 4 (four) times daily -  with meals and at bedtime. 11/16/13   Susy Frizzle, MD  SYMBICORT 160-4.5 MCG/ACT inhaler INHALE 2 PUFFS FIRST THING IN MORNING AND THEN ANOTHER 2 PUFFS ABOUT 12 HOURS LATER. 02/04/14   Susy Frizzle, MD  valsartan (DIOVAN) 80 MG tablet TAKE ONE TABLET BY MOUTH DAILY. 04/30/14   Susy Frizzle, MD   BP 151/84 mmHg  Temp(Src) 97.9 F (36.6 C)  Resp 20  SpO2 92%   Physical Exam  Constitutional: He is oriented to person, place, and time. He appears well-developed and well-nourished. No distress.  HENT:  Head: Normocephalic and atraumatic.  Right Ear: External ear normal.  Left Ear: External ear normal.  Mouth/Throat: Oropharynx is  clear and moist.  Eyes: EOM are normal. Pupils are equal, round, and reactive to light.  Neck: Normal range of motion.  Cardiovascular: Normal rate and regular rhythm.   Pulses:      Radial pulses are 2+ on the right side, and 2+ on the left side.       Dorsalis pedis pulses are 2+ on the right side, and 2+ on the left side.  No peripheral edema  Pulmonary/Chest: Effort normal. No respiratory distress. He has decreased breath sounds (in all fields). He  has no wheezes. He has no rales.  Abdominal: Soft. He exhibits no distension. There is no tenderness. There is no rebound and no guarding.  Neurological: He is alert and oriented to person, place, and time.  Skin: Skin is warm and dry. No rash noted. He is not diaphoretic.  Psychiatric: He has a normal mood and affect.  Vitals reviewed.   ED Course  Procedures   Labs Review  Results for orders placed or performed during the hospital encounter of 07/05/14  CBC with Differential  Result Value Ref Range   WBC 7.8 4.0 - 10.5 K/uL   RBC 4.98 4.22 - 5.81 MIL/uL   Hemoglobin 16.7 13.0 - 17.0 g/dL   HCT 48.9 39.0 - 52.0 %   MCV 98.2 78.0 - 100.0 fL   MCH 33.5 26.0 - 34.0 pg   MCHC 34.2 30.0 - 36.0 g/dL   RDW 13.5 11.5 - 15.5 %   Platelets 184 150 - 400 K/uL   Neutrophils Relative % 82 (H) 43 - 77 %   Neutro Abs 6.4 1.7 - 7.7 K/uL   Lymphocytes Relative 9 (L) 12 - 46 %   Lymphs Abs 0.7 0.7 - 4.0 K/uL   Monocytes Relative 9 3 - 12 %   Monocytes Absolute 0.7 0.1 - 1.0 K/uL   Eosinophils Relative 0 0 - 5 %   Eosinophils Absolute 0.0 0.0 - 0.7 K/uL   Basophils Relative 0 0 - 1 %   Basophils Absolute 0.0 0.0 - 0.1 K/uL  Comprehensive metabolic panel  Result Value Ref Range   Sodium 142 137 - 147 mEq/L   Potassium 4.4 3.7 - 5.3 mEq/L   Chloride 105 96 - 112 mEq/L   CO2 21 19 - 32 mEq/L   Glucose, Bld 95 70 - 99 mg/dL   BUN 20 6 - 23 mg/dL   Creatinine, Ser 1.02 0.50 - 1.35 mg/dL   Calcium 9.5 8.4 - 10.5 mg/dL   Total Protein 6.4  6.0 - 8.3 g/dL   Albumin 3.4 (L) 3.5 - 5.2 g/dL   AST 21 0 - 37 U/L   ALT 31 0 - 53 U/L   Alkaline Phosphatase 64 39 - 117 U/L   Total Bilirubin 0.5 0.3 - 1.2 mg/dL   GFR calc non Af Amer 69 (L) >90 mL/min   GFR calc Af Amer 80 (L) >90 mL/min   Anion gap 16 (H) 5 - 15  Pro b natriuretic peptide  Result Value Ref Range   Pro B Natriuretic peptide (BNP) 210.0 0 - 450 pg/mL  I-Stat CG4 Lactic Acid, ED  Result Value Ref Range   Lactic Acid, Venous 2.47 (H) 0.5 - 2.2 mmol/L  I-stat troponin, ED  Result Value Ref Range   Troponin i, poc 0.00 0.00 - 0.08 ng/mL   Comment 3             Imaging Review Dg Chest Portable 1 View  07/05/2014   CLINICAL DATA:  79 year old with dyspnea  EXAM: PORTABLE CHEST - 1 VIEW  COMPARISON:  05/22/2014  FINDINGS: The cardiac silhouette and mediastinal contours are within normal limits. Atherosclerotic aortic calcifications are present.  Upper lobe predominant emphysematous changes are present with associated mild architectural distortion. A calcified pulmonary nodule projects over the right lower lobe.  Interstitial opacities are noted in the lung bases. There is no pleural effusion. There is no pneumothorax.  There is no acute osseous abnormality.  IMPRESSION: 1. Upper lobe predominant emphysematous changes. 2. Suspect  acute bibasilar interstitial pneumonitis.   Electronically Signed   By: Rosemarie Ax   On: 07/05/2014 11:30     EKG Interpretation   Date/Time:  Friday July 05 2014 10:32:52 EST Ventricular Rate:  80 PR Interval:  130 QRS Duration: 100 QT Interval:  368 QTC Calculation: 424 R Axis:   -42 Text Interpretation:  Sinus rhythm with frequent Premature ventricular  complexes and Premature atrial complexes Left axis deviation Abnormal ECG  Similar to prior Confirmed by Mingo Amber  MD, [place] ([id number]) on 07/05/2014  10:44:48 AM      MDM   Final diagnoses:  Dyspnea  COPD exacerbation   79 y.o. Male with a history of COPD presents with  increased sputum production, shortness of breath, and new oxygen requirement. He has been on Levaquin and Prednisone at home for the last 4 days. Saw his PCP today for follow up, was doing worse, was sent to the ED for further evaluation and management. Received IM DepoMedrol at his PCP's office this morning.   Concern for COPD exacerbation, underlying pneumonia. Doubt ACS, PE given no chest pain or tachycardia and very typical presentation of COPD exacerbation with increased dyspnea and sputum production, however if no improvement with treatment, will consider CTA of the chest. Will treat with continuous nebulizer. Low threshold for beginning BiPAP. Will reassess.   CXR showed emphysematous changes and bibasilar pneumonitis. Gave him doxycycline. Already received steroids.    12:25 On re-evaluation, the patient is almost finished with his continuous neb. Has increased air movement with mild end expiratory wheezes now. Moving much more air. He is feeling much better and appears comfortable. Advised him that he will be admitted for continued treatment. No need for BiPAP at this time. Given his marked improvement, do not feel this is related to a PE.  Filed Vitals:   07/05/14 1200 07/05/14 1215 07/05/14 1230 07/05/14 1245  BP: 148/63 133/58 134/64 135/61  Pulse: 74 81 85 91  Temp:    97.9 F (36.6 C)  TempSrc:    Oral  Resp: 18 21 21 18   SpO2: 93% 92% 92% 91%   Discussed his case with the hospitalist. He will be admitted to the hospitalist. Temporary orders placed for a telemetry bed.   This case managed in conjunction with my attending, Dr. Mingo Amber.   Berenice Primas, MD 07/05/14 [place], MD 07/05/14 [phone]

## 2014-07-06 DIAGNOSIS — J9601 Acute respiratory failure with hypoxia: Secondary | ICD-10-CM | POA: Diagnosis present

## 2014-07-06 DIAGNOSIS — M79609 Pain in unspecified limb: Secondary | ICD-10-CM

## 2014-07-06 DIAGNOSIS — I10 Essential (primary) hypertension: Secondary | ICD-10-CM

## 2014-07-06 LAB — COMPREHENSIVE METABOLIC PANEL
ALT: 26 U/L (ref 0–53)
ANION GAP: 14 (ref 5–15)
AST: 19 U/L (ref 0–37)
Albumin: 3 g/dL — ABNORMAL LOW (ref 3.5–5.2)
Alkaline Phosphatase: 50 U/L (ref 39–117)
BUN: 16 mg/dL (ref 6–23)
CALCIUM: 8.8 mg/dL (ref 8.4–10.5)
CO2: 19 mEq/L (ref 19–32)
Chloride: 106 mEq/L (ref 96–112)
Creatinine, Ser: 0.96 mg/dL (ref 0.50–1.35)
GFR, EST NON AFRICAN AMERICAN: 78 mL/min — AB (ref >90)
GLUCOSE: 128 mg/dL — AB (ref 70–99)
Potassium: 4.5 mEq/L (ref 3.7–5.3)
Sodium: 139 mEq/L (ref 137–147)
TOTAL PROTEIN: 5.5 g/dL — AB (ref 6.0–8.3)
Total Bilirubin: 0.4 mg/dL (ref 0.3–1.2)

## 2014-07-06 LAB — CBC
HCT: 46.5 % (ref 39.0–52.0)
HEMOGLOBIN: 15.3 g/dL (ref 13.0–17.0)
MCH: 33.3 pg (ref 26.0–34.0)
MCHC: 32.9 g/dL (ref 30.0–36.0)
MCV: 101.3 fL — ABNORMAL HIGH (ref 78.0–100.0)
PLATELETS: 167 10*3/uL (ref 150–400)
RBC: 4.59 MIL/uL (ref 4.22–5.81)
RDW: 13.5 % (ref 11.5–15.5)
WBC: 5.2 10*3/uL (ref 4.0–10.5)

## 2014-07-06 LAB — TROPONIN I: Troponin I: <0.3 ng/mL (ref <0.30)

## 2014-07-06 MED ORDER — VANCOMYCIN HCL 500 MG IV SOLR
500.0000 mg | Freq: Two times a day (BID) | INTRAVENOUS | Status: DC
  Administered 2014-07-06 – 2014-07-08 (×4): 500 mg via INTRAVENOUS
  Filled 2014-07-06 (×5): qty 500

## 2014-07-06 NOTE — Progress Notes (Signed)
ANTIBIOTIC CONSULT NOTE - INITIAL  Pharmacy Consult for Vancomycin Indication: COPD exacerbation, possible HCAP  Allergies  Allergen Reactions  . Codeine Other (See Comments)    Thought head was going to blow off  . Gluten Meal Other (See Comments)    Celiac disease    Patient Measurements: Weight: 132 lb 3.2 oz (59.966 kg)   Vital Signs: Temp: 97.6 F (36.4 C) (11/14 1317) Temp Source: Oral (11/14 1317) BP: 128/78 mmHg (11/14 1317) Pulse Rate: 80 (11/14 1317) Intake/Output from previous day: 11/13 0701 - 11/14 0700 In: 1848.8 [P.O.:240; I.V.:1608.8] Out: 1850 [Urine:1850] Intake/Output from this shift: Total I/O In: 483 [P.O.:480; I.V.:3] Out: 300 [Urine:300]  Labs:  Recent Labs  07/05/14 1104 07/05/14 1440 07/06/14 0120  WBC 7.8 5.1 5.2  HGB 16.7 15.6 15.3  PLT 184 168 167  CREATININE 1.02 0.99 0.96   Estimated Creatinine Clearance: 54.7 mL/min (by C-G formula based on Cr of 0.96). No results for input(s): VANCOTROUGH, VANCOPEAK, VANCORANDOM, GENTTROUGH, GENTPEAK, GENTRANDOM, TOBRATROUGH, TOBRAPEAK, TOBRARND, AMIKACINPEAK, AMIKACINTROU, AMIKACIN in the last 72 hours.   Microbiology: No results found for this or any previous visit (from the past 720 hour(s)).  Medical History: Past Medical History  Diagnosis Date  . Personal history of colonic polyps 04/26/2007    hyperplastic   . Diverticulosis of colon (without mention of hemorrhage)   . Family hx of colon cancer   . COPD (chronic obstructive pulmonary disease)   . Esophageal reflux   . Hypertension   . Hyperlipemia   . CAD (coronary artery disease)   . Throat cancer   . Other diseases of lung, not elsewhere classified   . Unspecified asthma(493.90)   . Nephrolithiasis   . Allergic rhinitis   . Bronchiectasis   . Celiac disease     Medications:  Scheduled:  . aspirin  81 mg Oral Daily  . atorvastatin  10 mg Oral q1800  . azithromycin  500 mg Intravenous Q24H  . budesonide-formoterol  2 puff  Inhalation BID  . ceFEPime (MAXIPIME) IV  1 g Intravenous Q12H  . enoxaparin (LOVENOX) injection  40 mg Subcutaneous Q24H  . famotidine  20 mg Oral QHS  . fluticasone  1 spray Each Nare Daily  . methylPREDNISolone (SOLU-MEDROL) injection  40 mg Intravenous Q12H  . montelukast  5 mg Oral QHS  . pantoprazole  40 mg Oral Daily  . sodium chloride  3 mL Intravenous Q12H  . tiotropium  18 mcg Inhalation Daily   Assessment: 48 yoM who presents to the ED with increased SOB and yellow sputum after being treated with levaquin and steroids as an outpatient for 5 days. He was initially started on Cefepime and Azithromycin, but Dr. Hartford Poli wished to escalate therapy today and discontinued azithromycin and started vancomycin. WBC have trended down and patient is afebrile.  He is on 3L of O2 of which he was not on at baseline. SCr is stable at 0.96 with urine output. RSV panel is pending.   Goal of Therapy:  Vancomycin trough level 15-20 mcg/ml  Plan:  1. Start Vancomycin 500 mg q12h 2. Discontinue Azithromycin 3. Continue Cefepime 1g IV every 12 hours 4. Will continue to follow renal function, culture results, LOT, and antibiotic de-escalation plans and check VT when at [place], PharmD Clinical Pharmacist - Resident Pager: [phone] 11/14/20153:18 PM

## 2014-07-06 NOTE — Progress Notes (Signed)
[facility] PROGRESS NOTE   RAHKIM RABALAIS NWG:[id number] DOB: Jan 21, 1935 DOA: 07/05/2014 PCP: Jenna Luo TOM, MD  HPI/Subjective: Very pleasant, still has shortness of breath, cough and sputum production No audible wheezing  Assessment/Plan: Principal Problem:   Acute respiratory failure with hypoxemia Active Problems:   GERD   COPD exacerbation   Hypertension    Acute hypoxic respiratory failure Patient not usually on oxygen at home, he uses 2 L at night.  Now requires 3 L of oxygen to keep oxygen saturation above 90%. CT angiography was done and showed no evidence of PE.  Acute COPD exacerbation Patient treated as outpatient with levofloxacin without improvement. Check influenza PCR. Started on cefepime and vancomycin, IV steroids. Other supportive management including bronchodilators, mucolytics, antitussives and oxygen as needed.  GERD Stable, continue home regimen of PPI and Pepcid.  Hypertension Diovan held, patient started on as needed hydralazine.  Code Status: full code Family Communication: Plan discussed with the patient. Disposition Plan: Remains inpatient   Consultants:  none  Procedures:  none  Antibiotics:  Cefepime and vancomycin.   Objective: Filed Vitals:   07/06/14 0547  BP: 116/66  Pulse: 58  Temp:   Resp:     Intake/Output Summary (Last 24 hours) at 07/06/14 1312 Last data filed at 07/06/14 1245  Gross per 24 hour  Intake 1831.75 ml  Output   1650 ml  Net 181.75 ml   Filed Weights   07/06/14 0447  Weight: 59.966 kg (132 lb 3.2 oz)    Exam: General: Alert and awake, oriented x3, not in any acute distress. HEENT: anicteric sclera, pupils reactive to light and accommodation, EOMI CVS: S1-S2 clear, no murmur rubs or gallops Chest: clear to auscultation bilaterally, no wheezing, rales or rhonchi Abdomen: soft nontender, nondistended, normal bowel sounds, no organomegaly Extremities: no cyanosis, clubbing or edema  noted bilaterally Neuro: Cranial nerves II-XII intact, no focal neurological deficits  Data Reviewed: Basic Metabolic Panel:  Recent Labs Lab 07/05/14 1104 07/05/14 1440 07/06/14 0120  NA 142  --  139  K 4.4  --  4.5  CL 105  --  106  CO2 21  --  19  GLUCOSE 95  --  128*  BUN 20  --  16  CREATININE 1.02 0.99 0.96  CALCIUM 9.5  --  8.8  MG  --  2.0  --    Liver Function Tests:  Recent Labs Lab 07/05/14 1104 07/05/14 1440 07/06/14 0120  AST 21 17 19   ALT 31 28 26   ALKPHOS 64 56 50  BILITOT 0.5 0.3 0.4  PROT 6.4 5.7* 5.5*  ALBUMIN 3.4* 3.1* 3.0*   No results for input(s): LIPASE, AMYLASE in the last 168 hours. No results for input(s): AMMONIA in the last 168 hours. CBC:  Recent Labs Lab 07/05/14 1104 07/05/14 1440 07/06/14 0120  WBC 7.8 5.1 5.2  NEUTROABS 6.4  --   --   HGB 16.7 15.6 15.3  HCT 48.9 45.0 46.5  MCV 98.2 98.5 101.3*  PLT 184 168 167   Cardiac Enzymes:  Recent Labs Lab 07/05/14 1440 07/05/14 2016 07/06/14 0120  TROPONINI <0.30 <0.30 <0.30   BNP (last 3 results)  Recent Labs  07/05/14 1104  PROBNP 210.0   CBG: No results for input(s): GLUCAP in the last 168 hours.  Micro No results found for this or any previous visit (from the past 240 hour(s)).   Studies: Ct Angio Chest Pe W/cm &/or Wo Cm  07/05/2014   CLINICAL DATA:  Shortness of breath, no chest pain, COPD  EXAM: CT ANGIOGRAPHY CHEST WITH CONTRAST  TECHNIQUE: Multidetector CT imaging of the chest was performed using the standard protocol during bolus administration of intravenous contrast. Multiplanar CT image reconstructions and MIPs were obtained to evaluate the vascular anatomy.  CONTRAST:  54m OMNIPAQUE IOHEXOL 350 MG/ML SOLN  COMPARISON:  03/06/2013, 03/12/2011  FINDINGS: There is adequate opacification of the pulmonary arteries. There is no pulmonary embolus, but evaluation of the lung bases is limited secondary to respiratory motion. There is mild dilatation of the  central pulmonary arteries as can be seen with pulmonary arterial hypertension. The heart size is normal. There is no pericardial effusion. There is mild thoracic aortic atherosclerosis.  Bilateral chronic lung disease with emphysema and bilateral upper lobe scarring with associated calcifications. There is a stable 9 mm pulmonary nodule in superior segment of the left lower lobe. There is a calcified pulmonary nodule in the right lower lobe. There is a stable area of nodular scarring along the medial aspect of the right major fissure in the superior segment of the left lower lobe. There is bibasilar atelectasis versus developing infiltrates.  There is no axillary, hilar, or mediastinal adenopathy.  There is no lytic or blastic osseous lesion.  There is a stable right upper pole renal cyst. The visualized portions of the upper abdomen are otherwise unremarkable.  Review of the MIP images confirms the above findings.  IMPRESSION: 1. There is no pulmonary embolus, but evaluation of the lung bases is limited secondary to respiratory motion. 2. Bibasilar airspace disease, left greater than right which may reflect atelectasis versus developing pneumonia. 3. Chronic bilateral upper lobe scarring and bilateral emphysema. Stable 9 mm pulmonary nodule in the superior segment of the left lower lobe.   Electronically Signed   By: HKathreen Devoid  On: 07/05/2014 21:28   Dg Chest Portable 1 View  07/05/2014   CLINICAL DATA:  79year old with dyspnea  EXAM: PORTABLE CHEST - 1 VIEW  COMPARISON:  05/22/2014  FINDINGS: The cardiac silhouette and mediastinal contours are within normal limits. Atherosclerotic aortic calcifications are present.  Upper lobe predominant emphysematous changes are present with associated mild architectural distortion. A calcified pulmonary nodule projects over the right lower lobe.  Interstitial opacities are noted in the lung bases. There is no pleural effusion. There is no pneumothorax.  There is no  acute osseous abnormality.  IMPRESSION: 1. Upper lobe predominant emphysematous changes. 2. Suspect acute bibasilar interstitial pneumonitis.   Electronically Signed   By: KRosemarie Ax  On: 07/05/2014 11:30    Scheduled Meds: . aspirin  81 mg Oral Daily  . atorvastatin  10 mg Oral q1800  . azithromycin  500 mg Intravenous Q24H  . budesonide-formoterol  2 puff Inhalation BID  . ceFEPime (MAXIPIME) IV  1 g Intravenous Q12H  . enoxaparin (LOVENOX) injection  40 mg Subcutaneous Q24H  . famotidine  20 mg Oral QHS  . fluticasone  1 spray Each Nare Daily  . methylPREDNISolone (SOLU-MEDROL) injection  40 mg Intravenous Q12H  . montelukast  5 mg Oral QHS  . pantoprazole  40 mg Oral Daily  . sodium chloride  3 mL Intravenous Q12H  . tiotropium  18 mcg Inhalation Daily   Continuous Infusions: . sodium chloride 75 mL/hr at 07/06/14 0425       Time spent: 35 minutes    [facility]  [facility] Pager [phone] 7PM-7AM, please contact night-coverage at [email or web address], password [facility]11/14/2015, 1:12  PM  LOS: 1 day

## 2014-07-06 NOTE — Plan of Care (Signed)
Problem: Phase I Progression Outcomes Goal: Dyspnea controlled at rest Outcome: Completed/Met Date Met:  07/06/14 Goal: Pain controlled with appropriate interventions Outcome: Completed/Met Date Met:  07/06/14 Goal: OOB as tolerated unless otherwise ordered Outcome: Completed/Met Date Met:  07/06/14 Goal: First antibiotic given within 6hrs of admit Outcome: Completed/Met Date Met:  07/06/14 Goal: Confirm chest x-ray completed Outcome: Completed/Met Date Met:  07/06/14 Goal: Consider Infectious Disease Consult Outcome: Not Applicable Date Met:  76/14/70 Goal: Consider pulmonary consult Outcome: Completed/Met Date Met:  07/06/14 Goal: Code status addressed with pt/family Outcome: Completed/Met Date Met:  07/06/14 Goal: Initial discharge plan identified Outcome: Completed/Met Date Met:  07/06/14 Goal: Voiding-avoid urinary catheter unless indicated Outcome: Completed/Met Date Met:  07/06/14 Goal: Hemodynamically stable Outcome: Completed/Met Date Met:  07/06/14 Goal: Other Phase I Outcomes/Goals Outcome: Not Applicable Date Met:  92/95/74  Problem: Phase II Progression Outcomes Goal: Encourage coughing & deep breathing Outcome: Completed/Met Date Met:  07/06/14 Goal: Wean O2 if indicated Outcome: Progressing Goal: Pain controlled Outcome: Completed/Met Date Met:  07/06/14 Goal: Review culture results with MD if needed Outcome: Not Applicable Date Met:  73/40/37 Goal: Progress activity as tolerated unless otherwise ordered Outcome: Completed/Met Date Met:  07/06/14 Goal: Tolerating diet Outcome: Completed/Met Date Met:  07/06/14 Goal: If HF, initiate HF Core Reminder Form Outcome: Not Applicable Date Met:  09/64/38 Goal: Other Phase II Outcomes/Goals Outcome: Not Applicable Date Met:  38/18/40  Problem: Phase III Progression Outcomes Goal: Pain controlled on oral analgesia Outcome: Completed/Met Date Met:  07/06/14 Goal: Tolerating diet Outcome: Completed/Met Date Met:   07/06/14

## 2014-07-06 NOTE — Plan of Care (Signed)
Problem: Phase I Progression Outcomes Goal: O2 sats > or equal 90% or at baseline Outcome: Completed/Met Date Met:  07/06/14 Goal: Dyspnea controlled at rest Outcome: Completed/Met Date Met:  07/06/14 Goal: Hemodynamically stable Outcome: Completed/Met Date Met:  07/06/14 Goal: Pain controlled Outcome: Completed/Met Date Met:  07/06/14 Goal: Tolerating diet Outcome: Completed/Met Date Met:  07/06/14  Problem: Phase II Progression Outcomes Goal: O2 sats > equal to 90% on RA or at baseline Outcome: Completed/Met Date Met:  07/06/14 Goal: Pain controlled on oral analgesia Outcome: Completed/Met Date Met:  07/06/14

## 2014-07-06 NOTE — Progress Notes (Signed)
VASCULAR LAB PRELIMINARY  PRELIMINARY  PRELIMINARY  PRELIMINARY  Bilateral lower extremity completed.    Preliminary report:  There is no DVT or SVT noted in the bilateral lower extremities.   Jiya Kissinger, RVT 07/06/2014, 2:21 PM

## 2014-07-07 NOTE — Progress Notes (Signed)
[facility] PROGRESS NOTE   Bruce Travis IRC:[id number] DOB: [birth year] DOA: 07/05/2014 PCP: Jenna Luo TOM, MD  HPI/Subjective: Very pleasant, SOB, cough and sputum production better than yesterday. Wheezing improved but continue IV steroids for today.  Assessment/Plan: Principal Problem:   Acute respiratory failure with hypoxemia Active Problems:   GERD   COPD exacerbation   Hypertension    Acute hypoxic respiratory failure Patient not usually on oxygen at home, he uses 2 L at night.  Now requires 3 L of oxygen to keep oxygen saturation above 90%. CT angiography was done and showed no evidence of PE. Less oxygen needs, on 2.5 L keep sats about 93%. Check BMP in a.m.  Acute COPD exacerbation Patient treated as outpatient with levofloxacin without improvement. Respiratory virus panel is pending. Started on cefepime and vancomycin, IV steroids. Other supportive management including bronchodilators, mucolytics, antitussives and oxygen as needed.  GERD Stable, continue home regimen of PPI and Pepcid.  Hypertension Diovan held, patient started on as needed hydralazine.  Code Status: full code Family Communication: Plan discussed with the patient. Disposition Plan: Remains inpatient   Consultants:  none  Procedures:  none  Antibiotics:  Cefepime and vancomycin.   Objective: Filed Vitals:   07/07/14 0617  BP: 126/74  Pulse: 65  Temp: 97.7 F (36.5 C)  Resp: 20    Intake/Output Summary (Last 24 hours) at 07/07/14 1015 Last data filed at 07/07/14 0600  Gross per 24 hour  Intake   2163 ml  Output    500 ml  Net   1663 ml   Filed Weights   07/06/14 0447  Weight: 59.966 kg (132 lb 3.2 oz)    Exam: General: Alert and awake, oriented x3, not in any acute distress. HEENT: anicteric sclera, pupils reactive to light and accommodation, EOMI CVS: S1-S2 clear, no murmur rubs or gallops Chest: clear to auscultation bilaterally, no wheezing, rales  or rhonchi Abdomen: soft nontender, nondistended, normal bowel sounds, no organomegaly Extremities: no cyanosis, clubbing or edema noted bilaterally Neuro: Cranial nerves II-XII intact, no focal neurological deficits  Data Reviewed: Basic Metabolic Panel:  Recent Labs Lab 07/05/14 1104 07/05/14 1440 07/06/14 0120  NA 142  --  139  K 4.4  --  4.5  CL 105  --  106  CO2 21  --  19  GLUCOSE 95  --  128*  BUN 20  --  16  CREATININE 1.02 0.99 0.96  CALCIUM 9.5  --  8.8  MG  --  2.0  --    Liver Function Tests:  Recent Labs Lab 07/05/14 1104 07/05/14 1440 07/06/14 0120  AST 21 17 19   ALT 31 28 26   ALKPHOS 64 56 50  BILITOT 0.5 0.3 0.4  PROT 6.4 5.7* 5.5*  ALBUMIN 3.4* 3.1* 3.0*   No results for input(s): LIPASE, AMYLASE in the last 168 hours. No results for input(s): AMMONIA in the last 168 hours. CBC:  Recent Labs Lab 07/05/14 1104 07/05/14 1440 07/06/14 0120  WBC 7.8 5.1 5.2  NEUTROABS 6.4  --   --   HGB 16.7 15.6 15.3  HCT 48.9 45.0 46.5  MCV 98.2 98.5 101.3*  PLT 184 168 167   Cardiac Enzymes:  Recent Labs Lab 07/05/14 1440 07/05/14 2016 07/06/14 0120  TROPONINI <0.30 <0.30 <0.30   BNP (last 3 results)  Recent Labs  07/05/14 1104  PROBNP 210.0   CBG: No results for input(s): GLUCAP in the last 168 hours.  Micro No results found for  this or any previous visit (from the past 240 hour(s)).   Studies: Ct Angio Chest Pe W/cm &/or Wo Cm  07/05/2014   CLINICAL DATA:  Shortness of breath, no chest pain, COPD  EXAM: CT ANGIOGRAPHY CHEST WITH CONTRAST  TECHNIQUE: Multidetector CT imaging of the chest was performed using the standard protocol during bolus administration of intravenous contrast. Multiplanar CT image reconstructions and MIPs were obtained to evaluate the vascular anatomy.  CONTRAST:  55m OMNIPAQUE IOHEXOL 350 MG/ML SOLN  COMPARISON:  03/06/2013, 03/12/2011  FINDINGS: There is adequate opacification of the pulmonary arteries. There is no  pulmonary embolus, but evaluation of the lung bases is limited secondary to respiratory motion. There is mild dilatation of the central pulmonary arteries as can be seen with pulmonary arterial hypertension. The heart size is normal. There is no pericardial effusion. There is mild thoracic aortic atherosclerosis.  Bilateral chronic lung disease with emphysema and bilateral upper lobe scarring with associated calcifications. There is a stable 9 mm pulmonary nodule in superior segment of the left lower lobe. There is a calcified pulmonary nodule in the right lower lobe. There is a stable area of nodular scarring along the medial aspect of the right major fissure in the superior segment of the left lower lobe. There is bibasilar atelectasis versus developing infiltrates.  There is no axillary, hilar, or mediastinal adenopathy.  There is no lytic or blastic osseous lesion.  There is a stable right upper pole renal cyst. The visualized portions of the upper abdomen are otherwise unremarkable.  Review of the MIP images confirms the above findings.  IMPRESSION: 1. There is no pulmonary embolus, but evaluation of the lung bases is limited secondary to respiratory motion. 2. Bibasilar airspace disease, left greater than right which may reflect atelectasis versus developing pneumonia. 3. Chronic bilateral upper lobe scarring and bilateral emphysema. Stable 9 mm pulmonary nodule in the superior segment of the left lower lobe.   Electronically Signed   By: HKathreen Devoid  On: 07/05/2014 21:28   Dg Chest Portable 1 View  07/05/2014   CLINICAL DATA:  79year old with dyspnea  EXAM: PORTABLE CHEST - 1 VIEW  COMPARISON:  05/22/2014  FINDINGS: The cardiac silhouette and mediastinal contours are within normal limits. Atherosclerotic aortic calcifications are present.  Upper lobe predominant emphysematous changes are present with associated mild architectural distortion. A calcified pulmonary nodule projects over the right lower  lobe.  Interstitial opacities are noted in the lung bases. There is no pleural effusion. There is no pneumothorax.  There is no acute osseous abnormality.  IMPRESSION: 1. Upper lobe predominant emphysematous changes. 2. Suspect acute bibasilar interstitial pneumonitis.   Electronically Signed   By: KRosemarie Ax  On: 07/05/2014 11:30    Scheduled Meds: . aspirin  81 mg Oral Daily  . atorvastatin  10 mg Oral q1800  . budesonide-formoterol  2 puff Inhalation BID  . ceFEPime (MAXIPIME) IV  1 g Intravenous Q12H  . enoxaparin (LOVENOX) injection  40 mg Subcutaneous Q24H  . famotidine  20 mg Oral QHS  . fluticasone  1 spray Each Nare Daily  . methylPREDNISolone (SOLU-MEDROL) injection  40 mg Intravenous Q12H  . montelukast  5 mg Oral QHS  . pantoprazole  40 mg Oral Daily  . sodium chloride  3 mL Intravenous Q12H  . tiotropium  18 mcg Inhalation Daily  . vancomycin  500 mg Intravenous Q12H   Continuous Infusions: . sodium chloride 75 mL/hr at 07/06/14 10388  Time spent: 35 minutes    [facility] A  [facility] Pager [phone] If 7PM-7AM, please contact night-coverage at [email or web address], password [facility] 07/07/2014, 10:15 AM  LOS: 2 days

## 2014-07-08 DIAGNOSIS — R0602 Shortness of breath: Secondary | ICD-10-CM

## 2014-07-08 DIAGNOSIS — R0600 Dyspnea, unspecified: Secondary | ICD-10-CM

## 2014-07-08 LAB — RESPIRATORY VIRUS PANEL
ADENOVIRUS: NOT DETECTED
INFLUENZA A: NOT DETECTED
Influenza A H1: NOT DETECTED
Influenza A H3: NOT DETECTED
Influenza B: NOT DETECTED
Metapneumovirus: NOT DETECTED
PARAINFLUENZA 2 A: NOT DETECTED
Parainfluenza 1: NOT DETECTED
Parainfluenza 3: NOT DETECTED
RESPIRATORY SYNCYTIAL VIRUS B: NOT DETECTED
Respiratory Syncytial Virus A: NOT DETECTED
Rhinovirus: DETECTED — AB

## 2014-07-08 MED ORDER — CEFUROXIME AXETIL 500 MG PO TABS: 500.0000 mg | ORAL_TABLET | Freq: Two times a day (BID) | ORAL | Status: DC

## 2014-07-08 MED ORDER — AZITHROMYCIN 500 MG PO TABS: 500.0000 mg | ORAL_TABLET | Freq: Every day | ORAL | Status: DC

## 2014-07-08 MED ORDER — PREDNISONE (PAK) 10 MG PO TABS: ORAL_TABLET | ORAL | Status: DC

## 2014-07-08 MED ORDER — GUAIFENESIN ER 600 MG PO TB12: 1200.0000 mg | ORAL_TABLET | Freq: Two times a day (BID) | ORAL | Status: DC

## 2014-07-08 NOTE — Plan of Care (Signed)
Problem: Phase II Progression Outcomes Goal: Activity at appropriate level-compared to baseline (UP IN CHAIR FOR HEMODIALYSIS)  Outcome: Completed/Met Date Met:  07/08/14

## 2014-07-08 NOTE — Discharge Summary (Signed)
Physician Discharge Summary  Bruce Travis SJG:[id number] DOB: 1935-01-12 DOA: 07/05/2014  PCP: Odette Fraction, MD  Admit date: 07/05/2014 Discharge date: 07/08/2014  Time spent: 40 minutes  Recommendations for Outpatient Follow-up:  1. Follow-up with primary care physician within one week.  Discharge Diagnoses:  Principal Problem:   Acute respiratory failure with hypoxemia Active Problems:   GERD   COPD exacerbation   Hypertension   Discharge Condition: stable  Diet recommendation: heart healthy diet  Filed Weights   07/06/14 0447 07/08/14 0511  Weight: 59.966 kg (132 lb 3.2 oz) 60.056 kg (132 lb 6.4 oz)    History of present illness:  A very pleasant 79 year old white male with a history of emphysema. Beginning Saturday he developed a cough productive of thick yellow brown sputum and sore thorat on Monday morning . He reports increasing shortness of breath. His oxygen saturation is 85% off oxygen. He denies any hemoptysis. He denies any chest pain. Primary care provider on 11/9. Received 1 g Rocephin IM and started on Steroids. Currently on prednisone 40 mg of levofloxacin 500 mg. Also using albuterol nebulizer every 4 hours. Saw his primary care provider again on 11/13 with worsening symptoms oxygen saturation 81% with labored breathing.He has markedly diminished breath sounds in all 4 lung quadrants and diffuse expiratory wheezing. Patient was given a DuoNeb immediately in the office at approximately 8:30 and started on 2 L via nasal cannula of oxygen. He has been compliant with his Levaquin and his prednisone. Patient was given 80 mg of Depo-Medrol at approximately 8:40. sent to the ED for further evaluation.  In the ER the patient's chest x-ray suspicious for by basilar interstitial pneumonitis. afebrile  Hospital Course:    Acute hypoxic respiratory failure Patient not usually on oxygen at home, he uses 2 L at night. Oxygen saturation was 81% on admission. Required  3 L of oxygen to keep oxygen saturation above 90%. CT angiography was done and showed no evidence of PE or pneumonia. Less oxygen needs, on 2.5 L keep sats about 93%. Check BMP in a.m. Prior to discharge patient walk in the hallways without desaturation episodes.  Acute COPD exacerbation Patient treated as outpatient with levofloxacin without improvement. Respiratory virus panel is pending. Started on cefepime and vancomycin, IV steroids. Other supportive management including bronchodilators, mucolytics, antitussives and oxygen as needed. Patient discharged on Ceftin and azithromycin plus steroid taper, instructed to continue his Mucinex.  GERD Stable, continue home regimen of PPI and Pepcid.  Hypertension Diovan held, patient started on as needed hydralazine.  Procedures:  None  Consultations:  None  Discharge Exam: Filed Vitals:   07/08/14 0511  BP: 122/81  Pulse: 62  Temp: 98.2 F (36.8 C)  Resp: 18   General: Alert and awake, oriented x3, not in any acute distress. HEENT: anicteric sclera, pupils reactive to light and accommodation, EOMI CVS: S1-S2 clear, no murmur rubs or gallops Chest: clear to auscultation bilaterally, no wheezing, rales or rhonchi Abdomen: soft nontender, nondistended, normal bowel sounds, no organomegaly Extremities: no cyanosis, clubbing or edema noted bilaterally Neuro: Cranial nerves II-XII intact, no focal neurological deficits  Discharge Instructions You were cared for by a hospitalist during your hospital stay. If you have any questions about your discharge medications or the care you received while you were in the hospital after you are discharged, you can call the unit and asked to speak with the hospitalist on call if the hospitalist that took care of you is not available. Once you are  discharged, your primary care physician will handle any further medical issues. Please note that NO REFILLS for any discharge medications will be authorized  once you are discharged, as it is imperative that you return to your primary care physician (or establish a relationship with a primary care physician if you do not have one) for your aftercare needs so that they can reassess your need for medications and monitor your lab values.  Discharge Instructions    Diet - low sodium heart healthy    Complete by:  As directed      Increase activity slowly    Complete by:  As directed           Current Discharge Medication List    START taking these medications   Details  azithromycin (ZITHROMAX) 500 MG tablet Take 1 tablet (500 mg total) by mouth daily. Qty: 5 tablet, Refills: 0    cefUROXime (CEFTIN) 500 MG tablet Take 1 tablet (500 mg total) by mouth 2 (two) times daily with a meal. Qty: 10 tablet, Refills: 0    predniSONE (STERAPRED UNI-PAK) 10 MG tablet Take 6-5-4-3-2-1 PO daily till gone. Qty: 21 tablet, Refills: 0      CONTINUE these medications which have CHANGED   Details  guaiFENesin (MUCINEX) 600 MG 12 hr tablet Take 2 tablets (1,200 mg total) by mouth 2 (two) times daily. Qty: 30 tablet, Refills: 0      CONTINUE these medications which have NOT CHANGED   Details  albuterol (PROVENTIL HFA;VENTOLIN HFA) 108 (90 BASE) MCG/ACT inhaler Inhale 2 puffs into the lungs every 4 (four) hours as needed. For shortness of breath Qty: 1 Inhaler, Refills: 3   Associated Diagnoses: COPD exacerbation    albuterol (PROVENTIL) (2.5 MG/3ML) 0.083% nebulizer solution Take 3 mLs (2.5 mg total) by nebulization every 6 (six) hours as needed for wheezing or shortness of breath. Qty: 150 mL, Refills: 1    AMBULATORY NON FORMULARY MEDICATION O2 2 Lpm at night    aspirin 81 MG tablet Take 81 mg by mouth daily.     atorvastatin (LIPITOR) 10 MG tablet Take 1 tablet (10 mg total) by mouth daily. Qty: 90 tablet, Refills: 3    Coenzyme Q10 100 MG capsule Take 200 mg by mouth daily.    dexlansoprazole (DEXILANT) 60 MG capsule Take 1 capsule (60 mg  total) by mouth daily. Qty: 30 capsule, Refills: 5    famotidine (PEPCID) 20 MG tablet Take 20 mg by mouth at bedtime.    fluticasone (FLONASE) 50 MCG/ACT nasal spray INSTILL 2 SPRAYS INTO EACH NOSTRIL DAILY. Qty: 16 g, Refills: 11    HYDROcodone-homatropine (HYCODAN) 5-1.5 MG/5ML syrup Take 5 mLs by mouth every 6 (six) hours as needed for cough.    montelukast (SINGULAIR) 10 MG tablet TAKE 1 TABLET BY MOUTH DAILY. Qty: 30 tablet, Refills: PRN    SPIRIVA HANDIHALER 18 MCG inhalation capsule INHALE 1 CAPSULE ONCE DAILY AS DIRECTED Qty: 30 capsule, Refills: 11    SYMBICORT 160-4.5 MCG/ACT inhaler INHALE 2 PUFFS FIRST THING IN MORNING AND THEN ANOTHER 2 PUFFS ABOUT 12 HOURS LATER. Qty: 10.2 g, Refills: 11    valsartan (DIOVAN) 80 MG tablet TAKE ONE TABLET BY MOUTH DAILY. Qty: 90 tablet, Refills: 3      STOP taking these medications     levofloxacin (LEVAQUIN) 500 MG tablet      predniSONE (DELTASONE) 20 MG tablet      HYDROcodone-acetaminophen (NORCO) 5-325 MG per tablet  sucralfate (CARAFATE) 1 G tablet        Allergies  Allergen Reactions  . Codeine Other (See Comments)    Thought head was going to blow off  . Gluten Meal Other (See Comments)    Celiac disease   Follow-up Information    Follow up with [facility] TOM, MD In 1 week.   Specialty:  Family Medicine   Contact information:   [address] [place] [address] [phone]        The results of significant diagnostics from this hospitalization (including imaging, microbiology, ancillary and laboratory) are listed below for reference.    Significant Diagnostic Studies: Ct Angio Chest Pe W/cm &/or Wo Cm  07/05/2014   CLINICAL DATA:  Shortness of breath, no chest pain, COPD  EXAM: CT ANGIOGRAPHY CHEST WITH CONTRAST  TECHNIQUE: Multidetector CT imaging of the chest was performed using the standard protocol during bolus administration of intravenous contrast. Multiplanar CT image  reconstructions and MIPs were obtained to evaluate the vascular anatomy.  CONTRAST:  46m OMNIPAQUE IOHEXOL 350 MG/ML SOLN  COMPARISON:  03/06/2013, 03/12/2011  FINDINGS: There is adequate opacification of the pulmonary arteries. There is no pulmonary embolus, but evaluation of the lung bases is limited secondary to respiratory motion. There is mild dilatation of the central pulmonary arteries as can be seen with pulmonary arterial hypertension. The heart size is normal. There is no pericardial effusion. There is mild thoracic aortic atherosclerosis.  Bilateral chronic lung disease with emphysema and bilateral upper lobe scarring with associated calcifications. There is a stable 9 mm pulmonary nodule in superior segment of the left lower lobe. There is a calcified pulmonary nodule in the right lower lobe. There is a stable area of nodular scarring along the medial aspect of the right major fissure in the superior segment of the left lower lobe. There is bibasilar atelectasis versus developing infiltrates.  There is no axillary, hilar, or mediastinal adenopathy.  There is no lytic or blastic osseous lesion.  There is a stable right upper pole renal cyst. The visualized portions of the upper abdomen are otherwise unremarkable.  Review of the MIP images confirms the above findings.  IMPRESSION: 1. There is no pulmonary embolus, but evaluation of the lung bases is limited secondary to respiratory motion. 2. Bibasilar airspace disease, left greater than right which may reflect atelectasis versus developing pneumonia. 3. Chronic bilateral upper lobe scarring and bilateral emphysema. Stable 9 mm pulmonary nodule in the superior segment of the left lower lobe.   Electronically Signed   By: HKathreen Devoid  On: 07/05/2014 21:28   Dg Chest Portable 1 View  07/05/2014   CLINICAL DATA:  79year old with dyspnea  EXAM: PORTABLE CHEST - 1 VIEW  COMPARISON:  05/22/2014  FINDINGS: The cardiac silhouette and mediastinal contours  are within normal limits. Atherosclerotic aortic calcifications are present.  Upper lobe predominant emphysematous changes are present with associated mild architectural distortion. A calcified pulmonary nodule projects over the right lower lobe.  Interstitial opacities are noted in the lung bases. There is no pleural effusion. There is no pneumothorax.  There is no acute osseous abnormality.  IMPRESSION: 1. Upper lobe predominant emphysematous changes. 2. Suspect acute bibasilar interstitial pneumonitis.   Electronically Signed   By: KRosemarie Ax  On: 07/05/2014 11:30    Microbiology: No results found for this or any previous visit (from the past 240 hour(s)).   Labs: Basic Metabolic Panel:  Recent Labs Lab 07/05/14 1104  07/05/14 1440 07/06/14 0120  NA 142  --  139  K 4.4  --  4.5  CL 105  --  106  CO2 21  --  19  GLUCOSE 95  --  128*  BUN 20  --  16  CREATININE 1.02 0.99 0.96  CALCIUM 9.5  --  8.8  MG  --  2.0  --    Liver Function Tests:  Recent Labs Lab 07/05/14 1104 07/05/14 1440 07/06/14 0120  AST 21 17 19   ALT 31 28 26   ALKPHOS 64 56 50  BILITOT 0.5 0.3 0.4  PROT 6.4 5.7* 5.5*  ALBUMIN 3.4* 3.1* 3.0*   No results for input(s): LIPASE, AMYLASE in the last 168 hours. No results for input(s): AMMONIA in the last 168 hours. CBC:  Recent Labs Lab 07/05/14 1104 07/05/14 1440 07/06/14 0120  WBC 7.8 5.1 5.2  NEUTROABS 6.4  --   --   HGB 16.7 15.6 15.3  HCT 48.9 45.0 46.5  MCV 98.2 98.5 101.3*  PLT 184 168 167   Cardiac Enzymes:  Recent Labs Lab 07/05/14 1440 07/05/14 2016 07/06/14 0120  TROPONINI <0.30 <0.30 <0.30   BNP: BNP (last 3 results)  Recent Labs  07/05/14 1104  PROBNP 210.0   CBG: No results for input(s): GLUCAP in the last 168 hours.     Signed:  Tywana Robotham A  [facility] 07/08/2014, 11:41 AM

## 2014-07-08 NOTE — Care Management Note (Signed)
    Page 1 of 1   07/08/2014     1:36:41 PM CARE MANAGEMENT NOTE 07/08/2014  Patient:  Bruce Travis, Bruce Travis   Account Number:  [id number]  Date Initiated:  07/08/2014  Documentation initiated by:  GRAVES-BIGELOW,Teesha Ohm  Subjective/Objective Assessment:   Pt admitted for COPD exacerbation. Pt has home 02 from home with wife.     Action/Plan:   Pt d/c today, no needs.   Anticipated DC Date:  07/08/2014   Anticipated DC Plan:  [place]  CM consult      Choice offered to / List presented to:             Status of service:  Completed, signed off Medicare Important Message given?  YES (If response is "NO", the following Medicare IM given date fields will be blank) Date Medicare IM given:  07/08/2014 Medicare IM given by:  GRAVES-BIGELOW,Mackayla Mullins Date Additional Medicare IM given:   Additional Medicare IM given by:    Discharge Disposition:  HOME/SELF CARE  Per UR Regulation:  Reviewed for med. necessity/level of care/duration of stay  If discussed at [place] of Stay Meetings, dates discussed:    Comments:

## 2014-07-15 ENCOUNTER — Ambulatory Visit (INDEPENDENT_AMBULATORY_CARE_PROVIDER_SITE_OTHER): Payer: Medicare Other | Admitting: Family Medicine

## 2014-07-15 ENCOUNTER — Other Ambulatory Visit: Payer: Self-pay | Admitting: Family Medicine

## 2014-07-15 ENCOUNTER — Encounter: Payer: Self-pay | Admitting: Family Medicine

## 2014-07-15 VITALS — BP 130/70 | HR 72 | Temp 97.4°F | Resp 22 | Ht 66.5 in | Wt 134.0 lb

## 2014-07-15 DIAGNOSIS — Z09 Encounter for follow-up examination after completed treatment for conditions other than malignant neoplasm: Secondary | ICD-10-CM

## 2014-07-15 MED ORDER — PREDNISONE 20 MG PO TABS: 40.0000 mg | ORAL_TABLET | Freq: Every day | ORAL | Status: DC

## 2014-07-15 NOTE — Progress Notes (Signed)
Subjective:    Patient ID: Bruce Travis, male    DOB: 11/25/1934, 79 y.o.   MRN: [id number]  HPI  Please see my previous office visit. Patient was admitted to the hospital for 4 days. He was treated with IV steroids, vancomycin, and cefepime. CT angiogram revealed no pulmonary embolism but it did show bibasilar atelectasis versus developing pneumonia. He was discharged home on ceftin, Zithromax, and prednisone. He completed a prednisone taper yesterday. He is doing much better now. He has only required his albuterol one time in the last 24 hours. He still has a cough productive of thick mucus which is brown and foul tasting. Overall that he feels much better. His CT angiogram also revealed a 9 mm left pulmonary nodule which is chronic. I reviewed his hospital discharge summary as well as his CT angiogram and his lab work. Past Medical History  Diagnosis Date  . Personal history of colonic polyps 04/26/2007    hyperplastic   . Diverticulosis of colon (without mention of hemorrhage)   . Family hx of colon cancer   . COPD (chronic obstructive pulmonary disease)   . Esophageal reflux   . Hypertension   . Hyperlipemia   . CAD (coronary artery disease)   . Throat cancer   . Other diseases of lung, not elsewhere classified   . Unspecified asthma(493.90)   . Nephrolithiasis   . Allergic rhinitis   . Bronchiectasis   . Celiac disease    Past Surgical History  Procedure Laterality Date  . Vasectomy    . Head and neck surgery    . 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 Hemorrhoid surgery    . Hand surgery      d/t crush injury, right  . Tonsillectomy and adenoidectomy     Current Outpatient Prescriptions on File Prior to Visit  Medication Sig Dispense Refill  . albuterol (PROVENTIL HFA;VENTOLIN HFA) 108 (90 BASE) MCG/ACT inhaler Inhale 2 puffs into the lungs every 4 (four) hours as needed. For shortness of breath 1 Inhaler 3  . albuterol (PROVENTIL) (2.5 MG/3ML) 0.083% nebulizer  solution Take 3 mLs (2.5 mg total) by nebulization every 6 (six) hours as needed for wheezing or shortness of breath. 150 mL 1  . AMBULATORY NON FORMULARY MEDICATION O2 2 Lpm at night    . aspirin 81 MG tablet Take 81 mg by mouth daily.     Marland Kitchen atorvastatin (LIPITOR) 10 MG tablet Take 1 tablet (10 mg total) by mouth daily. 90 tablet 3  . Coenzyme Q10 100 MG capsule Take 200 mg by mouth daily.    Marland Kitchen dexlansoprazole (DEXILANT) 60 MG capsule Take 1 capsule (60 mg total) by mouth daily. 30 capsule 5  . famotidine (PEPCID) 20 MG tablet Take 20 mg by mouth at bedtime.    . fluticasone (FLONASE) 50 MCG/ACT nasal spray INSTILL 2 SPRAYS INTO EACH NOSTRIL DAILY. 16 g 11  . guaiFENesin (MUCINEX) 600 MG 12 hr tablet Take 2 tablets (1,200 mg total) by mouth 2 (two) times daily. 30 tablet 0  . HYDROcodone-homatropine (HYCODAN) 5-1.5 MG/5ML syrup Take 5 mLs by mouth every 6 (six) hours as needed for cough.    . montelukast (SINGULAIR) 10 MG tablet TAKE 1 TABLET BY MOUTH DAILY. 30 tablet PRN  . SPIRIVA HANDIHALER 18 MCG inhalation capsule INHALE 1 CAPSULE ONCE DAILY AS DIRECTED 30 capsule 11  . SYMBICORT 160-4.5 MCG/ACT inhaler INHALE 2 PUFFS FIRST THING IN MORNING AND THEN  ANOTHER 2 PUFFS ABOUT 12 HOURS LATER. 10.2 g 11  . valsartan (DIOVAN) 80 MG tablet TAKE ONE TABLET BY MOUTH DAILY. 90 tablet 3   No current facility-administered medications on file prior to visit.   Allergies  Allergen Reactions  . Codeine Other (See Comments)    Thought head was going to blow off  . Gluten Meal Other (See Comments)    Celiac disease   History   Social History  . Marital Status: Married    Spouse Name: N/A    Number of Children: 3  . Years of Education: N/A   Occupational History  . retired     Psychologist, sport and exercise and Administrator   Social History Main Topics  . Smoking status: Former Smoker -- 2.00 packs/day for 35 years    Types: Cigarettes    Quit date: 01/21/1989  . Smokeless tobacco: Former Systems developer    Types: Chew     Quit date: 08/23/2001  . Alcohol Use: No  . Drug Use: No  . Sexual Activity: Not on file   Other Topics Concern  . Not on file   Social History Narrative     Review of Systems  All other systems reviewed and are negative.      Objective:   Physical Exam  Constitutional: He appears well-developed and well-nourished. No distress.  HENT:  Right Ear: External ear normal.  Left Ear: External ear normal.  Nose: Nose normal.  Mouth/Throat: Oropharynx is clear and moist.  Neck: Neck supple. No JVD present.  Cardiovascular: Normal rate, regular rhythm and normal heart sounds.   Pulmonary/Chest: Effort normal. No respiratory distress. He has decreased breath sounds. He has no wheezes. He has no rhonchi. He has no rales.  Skin: He is not diaphoretic.  Vitals reviewed.         Assessment & Plan:  Hospital discharge follow-up  Patient COPD exacerbation seems to be improving/resolving. I do not feel that he needs further antibiotics. At the present time he is also steroids and requiring very little albuterol. I would have a low threshold to resume prednisone and institute a more prolonged taper given his past history. I gave the patient prescription for prednisone 40 mg by mouth daily but instructed him not to fill this unless his symptoms worsen. He is to call me immediately if his symptoms begin to worsen. I gave the patient my home telephone number. If his symptoms continue to improve he does not need to start taking the prednisone.

## 2014-07-22 ENCOUNTER — Ambulatory Visit: Payer: Medicare Other | Admitting: Family Medicine

## 2014-07-23 ENCOUNTER — Inpatient Hospital Stay: Payer: Medicare Other | Admitting: Family Medicine

## 2014-07-26 ENCOUNTER — Emergency Department (HOSPITAL_COMMUNITY): Payer: Medicare Other

## 2014-07-26 ENCOUNTER — Emergency Department (HOSPITAL_COMMUNITY)
Admission: EM | Admit: 2014-07-26 | Discharge: 2014-07-26 | Disposition: A | Discharge status: Home / Self Care | Payer: Medicare Other | Attending: Emergency Medicine | Admitting: Emergency Medicine

## 2014-07-26 ENCOUNTER — Encounter (HOSPITAL_COMMUNITY): Payer: Self-pay | Admitting: *Deleted

## 2014-07-26 DIAGNOSIS — Z87891 Personal history of nicotine dependence: Secondary | ICD-10-CM | POA: Diagnosis not present

## 2014-07-26 DIAGNOSIS — I2510 Atherosclerotic heart disease of native coronary artery without angina pectoris: Secondary | ICD-10-CM | POA: Insufficient documentation

## 2014-07-26 DIAGNOSIS — Z7952 Long term (current) use of systemic steroids: Secondary | ICD-10-CM | POA: Insufficient documentation

## 2014-07-26 DIAGNOSIS — Y9289 Other specified places as the place of occurrence of the external cause: Secondary | ICD-10-CM | POA: Diagnosis not present

## 2014-07-26 DIAGNOSIS — J45909 Unspecified asthma, uncomplicated: Secondary | ICD-10-CM | POA: Diagnosis not present

## 2014-07-26 DIAGNOSIS — S20211A Contusion of right front wall of thorax, initial encounter: Secondary | ICD-10-CM | POA: Diagnosis not present

## 2014-07-26 DIAGNOSIS — Z8639 Personal history of other endocrine, nutritional and metabolic disease: Secondary | ICD-10-CM | POA: Insufficient documentation

## 2014-07-26 DIAGNOSIS — Y9389 Activity, other specified: Secondary | ICD-10-CM | POA: Insufficient documentation

## 2014-07-26 DIAGNOSIS — Z86010 Personal history of colonic polyps: Secondary | ICD-10-CM | POA: Insufficient documentation

## 2014-07-26 DIAGNOSIS — J449 Chronic obstructive pulmonary disease, unspecified: Secondary | ICD-10-CM | POA: Insufficient documentation

## 2014-07-26 DIAGNOSIS — Z7982 Long term (current) use of aspirin: Secondary | ICD-10-CM | POA: Insufficient documentation

## 2014-07-26 DIAGNOSIS — Z87442 Personal history of urinary calculi: Secondary | ICD-10-CM | POA: Diagnosis not present

## 2014-07-26 DIAGNOSIS — Z8719 Personal history of other diseases of the digestive system: Secondary | ICD-10-CM | POA: Insufficient documentation

## 2014-07-26 DIAGNOSIS — Z7951 Long term (current) use of inhaled steroids: Secondary | ICD-10-CM | POA: Insufficient documentation

## 2014-07-26 DIAGNOSIS — Y998 Other external cause status: Secondary | ICD-10-CM | POA: Insufficient documentation

## 2014-07-26 DIAGNOSIS — E785 Hyperlipidemia, unspecified: Secondary | ICD-10-CM | POA: Insufficient documentation

## 2014-07-26 DIAGNOSIS — Z87828 Personal history of other (healed) physical injury and trauma: Secondary | ICD-10-CM | POA: Insufficient documentation

## 2014-07-26 DIAGNOSIS — S20219A Contusion of unspecified front wall of thorax, initial encounter: Secondary | ICD-10-CM

## 2014-07-26 DIAGNOSIS — Z79899 Other long term (current) drug therapy: Secondary | ICD-10-CM | POA: Diagnosis not present

## 2014-07-26 DIAGNOSIS — W19XXXA Unspecified fall, initial encounter: Secondary | ICD-10-CM

## 2014-07-26 DIAGNOSIS — W01198A Fall on same level from slipping, tripping and stumbling with subsequent striking against other object, initial encounter: Secondary | ICD-10-CM | POA: Insufficient documentation

## 2014-07-26 DIAGNOSIS — Z8589 Personal history of malignant neoplasm of other organs and systems: Secondary | ICD-10-CM | POA: Diagnosis not present

## 2014-07-26 DIAGNOSIS — S29001A Unspecified injury of muscle and tendon of front wall of thorax, initial encounter: Secondary | ICD-10-CM | POA: Diagnosis present

## 2014-07-26 MED ORDER — OXYCODONE-ACETAMINOPHEN 5-325 MG PO TABS: 1.0000 | ORAL_TABLET | ORAL | Status: DC | PRN

## 2014-07-26 MED ORDER — NAPROXEN 500 MG PO TABS: 500.0000 mg | ORAL_TABLET | Freq: Two times a day (BID) | ORAL | Status: DC

## 2014-07-26 MED ORDER — OXYCODONE-ACETAMINOPHEN 5-325 MG PO TABS
1.0000 | ORAL_TABLET | Freq: Once | ORAL | Status: AC
  Administered 2014-07-26: 1 via ORAL

## 2014-07-26 MED ORDER — OXYCODONE-ACETAMINOPHEN 5-325 MG PO TABS
2.0000 | ORAL_TABLET | Freq: Once | ORAL | Status: AC
  Administered 2014-07-26: 2 via ORAL

## 2014-07-26 NOTE — ED Notes (Signed)
Pt reports having a fall this am and landed on his chest and upper abd, now having right rib pain, increases when he moves his right arm. Denies loc. Took advil pta with no relief.

## 2014-07-26 NOTE — Discharge Instructions (Signed)
Maximum of one tablet of oxycodone / acetaminophen at night before bed.  Please call your doctor for a followup appointment within 24-48 hours. When you talk to your doctor please let them know that you were seen in the emergency department and have them acquire all of your records so that they can discuss the findings with you and formulate a treatment plan to fully care for your new and ongoing problems.  Xray normal - no signs of new fractures.

## 2014-07-26 NOTE — ED Provider Notes (Signed)
CSN: [id number]     Arrival date & time 07/26/14  1309 History   First MD Initiated Contact with Patient 07/26/14 1738     Chief Complaint  Patient presents with  . Fall  . Pain     (Consider location/radiation/quality/duration/timing/severity/associated sxs/prior Treatment) HPI Comments: The patient is a 79 year old male, he has a history of COPD, coronary disease, prior rib injury and throat cancer. He states that he was taking out some trash this morning when he slipped on a frosty piece of trash and slipped to the ground striking his sternum on the ground. He had acute onset of pain which has been persistent throughout the day, worse with palpation, worse with deep breathing, not associated with coughing or shortness of breath at baseline. He denies any head injury, neck pain, arms or leg pain and has had no bruising or bleeding.  Patient is a 79 y.o. male presenting with fall. The history is provided by the patient.  Fall    Past Medical History  Diagnosis Date  . Personal history of colonic polyps 04/26/2007    hyperplastic   . Diverticulosis of colon (without mention of hemorrhage)   . Family hx of colon cancer   . COPD (chronic obstructive pulmonary disease)   . Esophageal reflux   . Hypertension   . Hyperlipemia   . CAD (coronary artery disease)   . Throat cancer   . Other diseases of lung, not elsewhere classified   . Unspecified asthma(493.90)   . Nephrolithiasis   . Allergic rhinitis   . Bronchiectasis   . Celiac disease    Past Surgical History  Procedure Laterality Date  . Vasectomy    . Head and neck surgery    . 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 Hemorrhoid surgery    . Hand surgery      d/t crush injury, right  . Tonsillectomy and adenoidectomy     Family History  Problem Relation Age of Onset  . Heart disease Father   . Colon cancer Father   . Prostate cancer Father   . Stroke Mother   . Endometrial cancer Sister    History  Substance  Use Topics  . Smoking status: Former Smoker -- 2.00 packs/day for 35 years    Types: Cigarettes    Quit date: 01/21/1989  . Smokeless tobacco: Former Systems developer    Types: Chew    Quit date: 08/23/2001  . Alcohol Use: No    Review of Systems  All other systems reviewed and are negative.     Allergies  Codeine and Gluten meal  Home Medications   Prior to Admission medications   Medication Sig Start Date End Date Taking? Authorizing Provider  albuterol (PROVENTIL HFA;VENTOLIN HFA) 108 (90 BASE) MCG/ACT inhaler Inhale 2 puffs into the lungs every 4 (four) hours as needed. For shortness of breath 07/01/14   Susy Frizzle, MD  albuterol (PROVENTIL) (2.5 MG/3ML) 0.083% nebulizer solution Take 3 mLs (2.5 mg total) by nebulization every 6 (six) hours as needed for wheezing or shortness of breath. 07/01/14   Susy Frizzle, MD  AMBULATORY NON FORMULARY MEDICATION O2 2 Lpm at night    Historical Provider, MD  aspirin 81 MG tablet Take 81 mg by mouth daily.     Historical Provider, MD  atorvastatin (LIPITOR) 10 MG tablet Take 1 tablet (10 mg total) by mouth daily. 01/17/14   Susy Frizzle, MD  Coenzyme Q10 100 MG capsule  Take 200 mg by mouth daily.    Historical Provider, MD  dexlansoprazole (DEXILANT) 60 MG capsule Take 1 capsule (60 mg total) by mouth daily. 12/07/13   Susy Frizzle, MD  famotidine (PEPCID) 20 MG tablet Take 20 mg by mouth at bedtime.    Historical Provider, MD  fluticasone (FLONASE) 50 MCG/ACT nasal spray INSTILL 2 SPRAYS INTO EACH NOSTRIL DAILY. 05/09/14   Susy Frizzle, MD  guaiFENesin (MUCINEX) 600 MG 12 hr tablet Take 2 tablets (1,200 mg total) by mouth 2 (two) times daily. 07/08/14   Verlee Monte, MD  HYDROcodone-homatropine (HYCODAN) 5-1.5 MG/5ML syrup Take 5 mLs by mouth every 6 (six) hours as needed for cough.    Historical Provider, MD  montelukast (SINGULAIR) 10 MG tablet TAKE 1 TABLET BY MOUTH DAILY. 07/15/14   Susy Frizzle, MD  naproxen (NAPROSYN) 500 MG  tablet Take 1 tablet (500 mg total) by mouth 2 (two) times daily with a meal. 07/26/14   Johnna Acosta, MD  oxyCODONE-acetaminophen (PERCOCET) 5-325 MG per tablet Take 1 tablet by mouth every 4 (four) hours as needed. 07/26/14   Johnna Acosta, MD  predniSONE (DELTASONE) 20 MG tablet Take 2 tablets (40 mg total) by mouth daily with breakfast. 07/15/14   Susy Frizzle, MD  SPIRIVA HANDIHALER 18 MCG inhalation capsule INHALE 1 CAPSULE ONCE DAILY AS DIRECTED 02/15/14   Susy Frizzle, MD  SYMBICORT 160-4.5 MCG/ACT inhaler INHALE 2 PUFFS FIRST THING IN MORNING AND THEN ANOTHER 2 PUFFS ABOUT 12 HOURS LATER. 02/04/14   Susy Frizzle, MD  valsartan (DIOVAN) 80 MG tablet TAKE ONE TABLET BY MOUTH DAILY. 04/30/14   Susy Frizzle, MD   BP 157/86 mmHg  Pulse 73  Temp(Src) 97.5 F (36.4 C) (Oral)  Resp 20  SpO2 94% Physical Exam  Constitutional: He appears well-developed and well-nourished. No distress.  HENT:  Head: Normocephalic and atraumatic.  Mouth/Throat: Oropharynx is clear and moist. No oropharyngeal exudate.  Eyes: Conjunctivae and EOM are normal. Pupils are equal, round, and reactive to light. Right eye exhibits no discharge. Left eye exhibits no discharge. No scleral icterus.  Neck: Normal range of motion. Neck supple. No JVD present. No thyromegaly present.  Cardiovascular: Normal rate, regular rhythm, normal heart sounds and intact distal pulses.  Exam reveals no gallop and no friction rub.   No murmur heard. Pulmonary/Chest: Effort normal. No respiratory distress. He has wheezes ( Mild and expiratory wheezing). He has no rales. He exhibits tenderness ( Reproducible tenderness over the chest wall in the lower sternal area and the right anterior ribs. No crepitance, subcutaneous emphysema or bruising seen. Chest appears symmetrical, normal chest rise).  Abdominal: Soft. Bowel sounds are normal. He exhibits no distension and no mass. There is no tenderness.  Musculoskeletal: Normal range  of motion. He exhibits no edema or tenderness.  Lymphadenopathy:    He has no cervical adenopathy.  Neurological: He is alert. Coordination normal.  Skin: Skin is warm and dry. No rash noted. No erythema.  Psychiatric: He has a normal mood and affect. His behavior is normal.  Nursing note and vitals reviewed.   ED Course  Procedures (including critical care time) Labs Review Labs Reviewed - No data to display  Imaging Review Dg Ribs Unilateral W/chest Right  07/26/2014   CLINICAL DATA:  [place] this morning. Slipped on frost. Landed with full weight on the chest. Worsening chest pain.  EXAM: RIGHT RIBS AND CHEST - 3+ VIEW  COMPARISON:  07/05/2014  FINDINGS: Heart size is normal. Mediastinal shadows are unremarkable. There is chronic pulmonary scarring with an old calcified granuloma in the right lung, all unchanged. No pneumothorax or hemothorax.  Right rib details show old healed fractures of the anterior seventh and eighth ribs, but no visible acute fracture.  IMPRESSION: Chronic pulmonary scarring and emphysema. Old fractures of the right seventh and eighth ribs. No acute fracture.   Electronically Signed   By: Nelson Chimes M.D.   On: 07/26/2014 15:57      MDM   Final diagnoses:  Contusion of ribs, unspecified laterality, initial encounter    The patient has a contusion to his chest wall. There is no signs of fractures that are new, there are old fractures, he states that he was kicked by a horse in the past but this is totally healed and has no tenderness at the site of the old fractures. He has tried ibuprofen this morning prior to arrival without improvement, he will need a stronger pain medication and close follow-up. I have counseled the patient regarding his use of strong pain medication at night as he is on oxygen and this gives increased risk for hypoxia, he expresses his understanding and will follow-up.    Meds given in ED:  Medications  oxyCODONE-acetaminophen  (PERCOCET/ROXICET) 5-325 MG per tablet 1 tablet (1 tablet Oral Given During Downtime 07/26/14 1335)  oxyCODONE-acetaminophen (PERCOCET/ROXICET) 5-325 MG per tablet 2 tablet (2 tablets Oral Given 07/26/14 1750)    New Prescriptions   NAPROXEN (NAPROSYN) 500 MG TABLET    Take 1 tablet (500 mg total) by mouth 2 (two) times daily with a meal.   OXYCODONE-ACETAMINOPHEN (PERCOCET) 5-325 MG PER TABLET    Take 1 tablet by mouth every 4 (four) hours as needed.        Johnna Acosta, MD 07/26/14 [phone]

## 2014-07-29 ENCOUNTER — Ambulatory Visit (INDEPENDENT_AMBULATORY_CARE_PROVIDER_SITE_OTHER): Payer: Medicare Other | Admitting: Family Medicine

## 2014-07-29 ENCOUNTER — Encounter: Payer: Self-pay | Admitting: Family Medicine

## 2014-07-29 VITALS — BP 126/76 | HR 72 | Temp 97.5°F | Resp 16 | Ht 66.5 in | Wt 136.0 lb

## 2014-07-29 DIAGNOSIS — S20211S Contusion of right front wall of thorax, sequela: Secondary | ICD-10-CM

## 2014-07-29 NOTE — Progress Notes (Signed)
Subjective:    Patient ID: Bruce Travis, male    DOB: 1935/08/01, 79 y.o.   MRN: [id number]  HPI Patient is seen in the hospital after falling last week. He suffered a contusion to his right chest wall just to the right side of his sternum. He is still having tenderness to palpation over the lower rib margin adjacent to the sternum. He denies any hemoptysis. He denies any fever. He denies any shortness of breath. He does have some mild pain with deep inspiration. He also has intense pain when he coughs. Otherwise he is doing well. I reviewed his x-rays from the hospital. X-ray showed no evidence of a rib fracture or pneumothorax. Past Medical History  Diagnosis Date  . Personal history of colonic polyps 04/26/2007    hyperplastic   . Diverticulosis of colon (without mention of hemorrhage)   . Family hx of colon cancer   . COPD (chronic obstructive pulmonary disease)   . Esophageal reflux   . Hypertension   . Hyperlipemia   . CAD (coronary artery disease)   . Throat cancer   . Other diseases of lung, not elsewhere classified   . Unspecified asthma(493.90)   . Nephrolithiasis   . Allergic rhinitis   . Bronchiectasis   . Celiac disease    Past Surgical History  Procedure Laterality Date  . Vasectomy    . Head and neck surgery    . 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 Hemorrhoid surgery    . Hand surgery      d/t crush injury, right  . Tonsillectomy and adenoidectomy     Current Outpatient Prescriptions on File Prior to Visit  Medication Sig Dispense Refill  . albuterol (PROVENTIL HFA;VENTOLIN HFA) 108 (90 BASE) MCG/ACT inhaler Inhale 2 puffs into the lungs every 4 (four) hours as needed. For shortness of breath 1 Inhaler 3  . albuterol (PROVENTIL) (2.5 MG/3ML) 0.083% nebulizer solution Take 3 mLs (2.5 mg total) by nebulization every 6 (six) hours as needed for wheezing or shortness of breath. 150 mL 1  . AMBULATORY NON FORMULARY MEDICATION O2 2 Lpm at night    . aspirin 81  MG tablet Take 81 mg by mouth daily.     Marland Kitchen atorvastatin (LIPITOR) 10 MG tablet Take 1 tablet (10 mg total) by mouth daily. 90 tablet 3  . Coenzyme Q10 100 MG capsule Take 200 mg by mouth daily.    Marland Kitchen dexlansoprazole (DEXILANT) 60 MG capsule Take 1 capsule (60 mg total) by mouth daily. 30 capsule 5  . famotidine (PEPCID) 20 MG tablet Take 20 mg by mouth at bedtime.    . fluticasone (FLONASE) 50 MCG/ACT nasal spray INSTILL 2 SPRAYS INTO EACH NOSTRIL DAILY. 16 g 11  . guaiFENesin (MUCINEX) 600 MG 12 hr tablet Take 2 tablets (1,200 mg total) by mouth 2 (two) times daily. 30 tablet 0  . HYDROcodone-homatropine (HYCODAN) 5-1.5 MG/5ML syrup Take 5 mLs by mouth every 6 (six) hours as needed for cough.    . montelukast (SINGULAIR) 10 MG tablet TAKE 1 TABLET BY MOUTH DAILY. 30 tablet 11  . naproxen (NAPROSYN) 500 MG tablet Take 1 tablet (500 mg total) by mouth 2 (two) times daily with a meal. 30 tablet 0  . oxyCODONE-acetaminophen (PERCOCET) 5-325 MG per tablet Take 1 tablet by mouth every 4 (four) hours as needed. 20 tablet 0  . predniSONE (DELTASONE) 20 MG tablet Take 2 tablets (40 mg total) by mouth  daily with breakfast. 14 tablet 0  . SPIRIVA HANDIHALER 18 MCG inhalation capsule INHALE 1 CAPSULE ONCE DAILY AS DIRECTED 30 capsule 11  . SYMBICORT 160-4.5 MCG/ACT inhaler INHALE 2 PUFFS FIRST THING IN MORNING AND THEN ANOTHER 2 PUFFS ABOUT 12 HOURS LATER. 10.2 g 11  . valsartan (DIOVAN) 80 MG tablet TAKE ONE TABLET BY MOUTH DAILY. 90 tablet 3   No current facility-administered medications on file prior to visit.   Allergies  Allergen Reactions  . Codeine Other (See Comments)    Thought head was going to blow off  . Gluten Meal Other (See Comments)    Celiac disease   History   Social History  . Marital Status: Married    Spouse Name: N/A    Number of Children: 3  . Years of Education: N/A   Occupational History  . retired     Psychologist, sport and exercise and Administrator   Social History Main Topics  . Smoking  status: Former Smoker -- 2.00 packs/day for 35 years    Types: Cigarettes    Quit date: 01/21/1989  . Smokeless tobacco: Former Systems developer    Types: Chew    Quit date: 08/23/2001  . Alcohol Use: No  . Drug Use: No  . Sexual Activity: Not on file   Other Topics Concern  . Not on file   Social History Narrative      Review of Systems  All other systems reviewed and are negative.      Objective:   Physical Exam  Constitutional: He appears well-developed and well-nourished. No distress.  Neck: Neck supple. No JVD present.  Cardiovascular: Normal rate, regular rhythm, normal heart sounds and intact distal pulses.  Exam reveals no gallop and no friction rub.   No murmur heard. Pulmonary/Chest: Effort normal. He has decreased breath sounds. He has no wheezes. He has no rhonchi. He has no rales. He exhibits tenderness.  Abdominal: Soft. Bowel sounds are normal.  Musculoskeletal: He exhibits no edema.  Lymphadenopathy:    He has no cervical adenopathy.  Skin: He is not diaphoretic.  Vitals reviewed.         Assessment & Plan:  Rib contusion, right, sequela  Patient has suffered a contusion to his right ribs. There may even be a hairline fracture that is not visible on x-ray. However he is doing very well. His pain is well controlled with Naprosyn. He is using narcotics sparingly. We discussed the need for good pulmonary toilet given his underlying COPD. I asked the patient that every hour he takes several deep breaths and try to cough hard one or 2 times to help clear the congestion out of his lungs. I recommended that he splint his chest against a pillow when he does this. As long as he is able to do this without severe pain he does not need to take the pain medication. But I asked the patient to do what is ever necessary to control his pain so that he can perform good pulmonary toilet to help prevent pneumonia.  Follow-up as necessary

## 2014-08-29 ENCOUNTER — Encounter: Payer: Self-pay | Admitting: Family Medicine

## 2014-08-29 ENCOUNTER — Ambulatory Visit (INDEPENDENT_AMBULATORY_CARE_PROVIDER_SITE_OTHER): Payer: 59 | Admitting: Family Medicine

## 2014-08-29 VITALS — BP 100/64 | HR 80 | Temp 97.7°F | Resp 22 | Ht 66.5 in | Wt 135.0 lb

## 2014-08-29 DIAGNOSIS — J441 Chronic obstructive pulmonary disease with (acute) exacerbation: Secondary | ICD-10-CM

## 2014-08-29 MED ORDER — HYDROCODONE-HOMATROPINE 5-1.5 MG/5ML PO SYRP: 5.0000 mL | ORAL_SOLUTION | Freq: Three times a day (TID) | ORAL | Status: DC | PRN

## 2014-08-29 MED ORDER — LEVOFLOXACIN 500 MG PO TABS: 500.0000 mg | ORAL_TABLET | Freq: Every day | ORAL | Status: DC

## 2014-08-29 MED ORDER — PREDNISONE 20 MG PO TABS: ORAL_TABLET | ORAL | Status: DC

## 2014-08-29 MED ORDER — ALBUTEROL SULFATE (2.5 MG/3ML) 0.083% IN NEBU: 2.5000 mg | INHALATION_SOLUTION | Freq: Four times a day (QID) | RESPIRATORY_TRACT | Status: DC | PRN

## 2014-08-29 NOTE — Progress Notes (Signed)
Subjective:    Patient ID: Bruce Travis, male    DOB: 1935/02/10, 80 y.o.   MRN: [id number]  HPI Patient has severe emphysema. Earlier this week he developed a cough productive of green sputum and worsening shortness of breath. The cough has steadily worsened and his breathing is steadily worsened. He is here today with a pulse oximetry of 91% on room air. He denies any chest pain. He denies any pleurisy. He denies any fever. Over the sputum production of his cough has changed and is now more copious and purulent. Past Medical History  Diagnosis Date  . Personal history of colonic polyps 04/26/2007    hyperplastic   . Diverticulosis of colon (without mention of hemorrhage)   . Family hx of colon cancer   . COPD (chronic obstructive pulmonary disease)   . Esophageal reflux   . Hypertension   . Hyperlipemia   . CAD (coronary artery disease)   . Throat cancer   . Other diseases of lung, not elsewhere classified   . Unspecified asthma(493.90)   . Nephrolithiasis   . Allergic rhinitis   . Bronchiectasis   . Celiac disease    Past Surgical History  Procedure Laterality Date  . Vasectomy    . Head and neck surgery    . 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 Hemorrhoid surgery    . Hand surgery      d/t crush injury, right  . Tonsillectomy and adenoidectomy     Current Outpatient Prescriptions on File Prior to Visit  Medication Sig Dispense Refill  . albuterol (PROVENTIL HFA;VENTOLIN HFA) 108 (90 BASE) MCG/ACT inhaler Inhale 2 puffs into the lungs every 4 (four) hours as needed. For shortness of breath 1 Inhaler 3  . AMBULATORY NON FORMULARY MEDICATION O2 2 Lpm at night    . aspirin 81 MG tablet Take 81 mg by mouth daily.     Marland Kitchen atorvastatin (LIPITOR) 10 MG tablet Take 1 tablet (10 mg total) by mouth daily. 90 tablet 3  . Coenzyme Q10 100 MG capsule Take 200 mg by mouth daily.    Marland Kitchen dexlansoprazole (DEXILANT) 60 MG capsule Take 1 capsule (60 mg total) by mouth daily. 30 capsule 5    . famotidine (PEPCID) 20 MG tablet Take 20 mg by mouth at bedtime.    . fluticasone (FLONASE) 50 MCG/ACT nasal spray INSTILL 2 SPRAYS INTO EACH NOSTRIL DAILY. 16 g 11  . guaiFENesin (MUCINEX) 600 MG 12 hr tablet Take 2 tablets (1,200 mg total) by mouth 2 (two) times daily. 30 tablet 0  . HYDROcodone-homatropine (HYCODAN) 5-1.5 MG/5ML syrup Take 5 mLs by mouth every 6 (six) hours as needed for cough.    . montelukast (SINGULAIR) 10 MG tablet TAKE 1 TABLET BY MOUTH DAILY. 30 tablet 11  . naproxen (NAPROSYN) 500 MG tablet Take 1 tablet (500 mg total) by mouth 2 (two) times daily with a meal. 30 tablet 0  . oxyCODONE-acetaminophen (PERCOCET) 5-325 MG per tablet Take 1 tablet by mouth every 4 (four) hours as needed. 20 tablet 0  . SPIRIVA HANDIHALER 18 MCG inhalation capsule INHALE 1 CAPSULE ONCE DAILY AS DIRECTED 30 capsule 11  . SYMBICORT 160-4.5 MCG/ACT inhaler INHALE 2 PUFFS FIRST THING IN MORNING AND THEN ANOTHER 2 PUFFS ABOUT 12 HOURS LATER. 10.2 g 11  . valsartan (DIOVAN) 80 MG tablet TAKE ONE TABLET BY MOUTH DAILY. 90 tablet 3   No current facility-administered medications on file prior to  visit.   Allergies  Allergen Reactions  . Codeine Other (See Comments)    Thought head was going to blow off  . Gluten Meal Other (See Comments)    Celiac disease   History   Social History  . Marital Status: Married    Spouse Name: N/A    Number of Children: 3  . Years of Education: N/A   Occupational History  . retired     Psychologist, sport and exercise and Administrator   Social History Main Topics  . Smoking status: Former Smoker -- 2.00 packs/day for 35 years    Types: Cigarettes    Quit date: 01/21/1989  . Smokeless tobacco: Former Systems developer    Types: Chew    Quit date: 08/23/2001  . Alcohol Use: No  . Drug Use: No  . Sexual Activity: Not on file   Other Topics Concern  . Not on file   Social History Narrative      Review of Systems  All other systems reviewed and are negative.      Objective:    Physical Exam  Cardiovascular: Normal rate, regular rhythm and normal heart sounds.   Pulmonary/Chest: No respiratory distress. He has decreased breath sounds. He has wheezes. He has no rhonchi. He has no rales.          Assessment & Plan:  COPD exacerbation - Plan: predniSONE (DELTASONE) 20 MG tablet, levofloxacin (LEVAQUIN) 500 MG tablet, albuterol (PROVENTIL) (2.5 MG/3ML) 0.083% nebulizer solution, HYDROcodone-homatropine (HYCODAN) 5-1.5 MG/5ML syrup  Start the patient on prednisone 40 mg by mouth daily. I will recheck the patient in 7 days and then try to wean him down gradually. He may need to stay on a low-dose of steroids given the frequency of his exacerbations. I also want him to use albuterol nebulizers every 6 hours. I will also start the patient on Levaquin 500 mg by mouth daily for the next 7 days

## 2014-09-05 ENCOUNTER — Ambulatory Visit (INDEPENDENT_AMBULATORY_CARE_PROVIDER_SITE_OTHER): Payer: 59 | Admitting: Family Medicine

## 2014-09-05 ENCOUNTER — Encounter: Payer: Self-pay | Admitting: Family Medicine

## 2014-09-05 VITALS — BP 100/62 | HR 78 | Temp 98.2°F | Resp 20 | Ht 66.5 in | Wt 138.0 lb

## 2014-09-05 DIAGNOSIS — J441 Chronic obstructive pulmonary disease with (acute) exacerbation: Secondary | ICD-10-CM

## 2014-09-05 MED ORDER — PREDNISONE 20 MG PO TABS: ORAL_TABLET | ORAL | Status: DC

## 2014-09-05 NOTE — Progress Notes (Signed)
Subjective:    Patient ID: Bruce Travis, male    DOB: May 01, 1935, 80 y.o.   MRN: [id number]  HPI Patient was seen last week for a COPD exacerbation. He was started on 7 days of Levaquin and 7 days of prednisone 40 mg a day. He was also instructed to use albuterol every 6 hours. His breathing is possibly 50% better. His productive cough is dramatically improved. He denies any hemoptysis. He denies any fevers or chills. He denies any chest pain or pleurisy. He denies any palpitations or irregular heartbeats. Past Medical History  Diagnosis Date  . Personal history of colonic polyps 04/26/2007    hyperplastic   . Diverticulosis of colon (without mention of hemorrhage)   . Family hx of colon cancer   . COPD (chronic obstructive pulmonary disease)   . Esophageal reflux   . Hypertension   . Hyperlipemia   . CAD (coronary artery disease)   . Throat cancer   . Other diseases of lung, not elsewhere classified   . Unspecified asthma(493.90)   . Nephrolithiasis   . Allergic rhinitis   . Bronchiectasis   . Celiac disease    Past Surgical History  Procedure Laterality Date  . Vasectomy    . Head and neck surgery    . 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 Hemorrhoid surgery    . Hand surgery      d/t crush injury, right  . Tonsillectomy and adenoidectomy     Current Outpatient Prescriptions on File Prior to Visit  Medication Sig Dispense Refill  . albuterol (PROVENTIL HFA;VENTOLIN HFA) 108 (90 BASE) MCG/ACT inhaler Inhale 2 puffs into the lungs every 4 (four) hours as needed. For shortness of breath 1 Inhaler 3  . albuterol (PROVENTIL) (2.5 MG/3ML) 0.083% nebulizer solution Take 3 mLs (2.5 mg total) by nebulization every 6 (six) hours as needed for wheezing or shortness of breath. 150 mL 1  . AMBULATORY NON FORMULARY MEDICATION O2 2 Lpm at night    . aspirin 81 MG tablet Take 81 mg by mouth daily.     Marland Kitchen atorvastatin (LIPITOR) 10 MG tablet Take 1 tablet (10 mg total) by mouth daily. 90  tablet 3  . Coenzyme Q10 100 MG capsule Take 200 mg by mouth daily.    Marland Kitchen dexlansoprazole (DEXILANT) 60 MG capsule Take 1 capsule (60 mg total) by mouth daily. 30 capsule 5  . famotidine (PEPCID) 20 MG tablet Take 20 mg by mouth at bedtime.    . fluticasone (FLONASE) 50 MCG/ACT nasal spray INSTILL 2 SPRAYS INTO EACH NOSTRIL DAILY. 16 g 11  . guaiFENesin (MUCINEX) 600 MG 12 hr tablet Take 2 tablets (1,200 mg total) by mouth 2 (two) times daily. 30 tablet 0  . HYDROcodone-homatropine (HYCODAN) 5-1.5 MG/5ML syrup Take 5 mLs by mouth every 6 (six) hours as needed for cough.    Marland Kitchen HYDROcodone-homatropine (HYCODAN) 5-1.5 MG/5ML syrup Take 5 mLs by mouth every 8 (eight) hours as needed for cough. 120 mL 0  . levofloxacin (LEVAQUIN) 500 MG tablet Take 1 tablet (500 mg total) by mouth daily. 7 tablet 0  . montelukast (SINGULAIR) 10 MG tablet TAKE 1 TABLET BY MOUTH DAILY. 30 tablet 11  . naproxen (NAPROSYN) 500 MG tablet Take 1 tablet (500 mg total) by mouth 2 (two) times daily with a meal. 30 tablet 0  . oxyCODONE-acetaminophen (PERCOCET) 5-325 MG per tablet Take 1 tablet by mouth every 4 (four) hours as needed.  20 tablet 0  . predniSONE (DELTASONE) 20 MG tablet 40 mg poqday 14 tablet 0  . SPIRIVA HANDIHALER 18 MCG inhalation capsule INHALE 1 CAPSULE ONCE DAILY AS DIRECTED 30 capsule 11  . SYMBICORT 160-4.5 MCG/ACT inhaler INHALE 2 PUFFS FIRST THING IN MORNING AND THEN ANOTHER 2 PUFFS ABOUT 12 HOURS LATER. 10.2 g 11  . valsartan (DIOVAN) 80 MG tablet TAKE ONE TABLET BY MOUTH DAILY. 90 tablet 3   No current facility-administered medications on file prior to visit.   Allergies  Allergen Reactions  . Codeine Other (See Comments)    Thought head was going to blow off  . Gluten Meal Other (See Comments)    Celiac disease   History   Social History  . Marital Status: Married    Spouse Name: N/A    Number of Children: 3  . Years of Education: N/A   Occupational History  . retired     Psychologist, sport and exercise and  Administrator   Social History Main Topics  . Smoking status: Former Smoker -- 2.00 packs/day for 35 years    Types: Cigarettes    Quit date: 01/21/1989  . Smokeless tobacco: Former Systems developer    Types: Chew    Quit date: 08/23/2001  . Alcohol Use: No  . Drug Use: No  . Sexual Activity: Not on file   Other Topics Concern  . Not on file   Social History Narrative      Review of Systems  All other systems reviewed and are negative.      Objective:   Physical Exam  Cardiovascular: Normal rate, regular rhythm and normal heart sounds.   Pulmonary/Chest: Effort normal. He has decreased breath sounds. He has no wheezes. He has no rhonchi.  Vitals reviewed.         Assessment & Plan:  COPD exacerbation - Plan: predniSONE (DELTASONE) 20 MG tablet  In the past, when I have abruptly stop prednisone, the patient has had a rebound of his exacerbation. Therefore I will wean the patient off prednisone gradually over the next 7 days. Finished antibiotics as prescribed. Decrease albuterol use to only as needed. If the patient has another exacerbation of his COPD and rapid succession I would leave the patient on low-dose prednisone long-term such as 5 mg a day.

## 2014-09-10 ENCOUNTER — Other Ambulatory Visit: Payer: Self-pay | Admitting: Family Medicine

## 2014-09-10 ENCOUNTER — Telehealth: Payer: Self-pay | Admitting: Family Medicine

## 2014-09-10 DIAGNOSIS — J441 Chronic obstructive pulmonary disease with (acute) exacerbation: Secondary | ICD-10-CM

## 2014-09-10 DIAGNOSIS — Z79899 Other long term (current) drug therapy: Secondary | ICD-10-CM

## 2014-09-10 DIAGNOSIS — I10 Essential (primary) hypertension: Secondary | ICD-10-CM

## 2014-09-10 DIAGNOSIS — Z0000 Encounter for general adult medical examination without abnormal findings: Secondary | ICD-10-CM

## 2014-09-10 DIAGNOSIS — Z125 Encounter for screening for malignant neoplasm of prostate: Secondary | ICD-10-CM

## 2014-09-10 DIAGNOSIS — K219 Gastro-esophageal reflux disease without esophagitis: Secondary | ICD-10-CM

## 2014-09-10 DIAGNOSIS — J45901 Unspecified asthma with (acute) exacerbation: Secondary | ICD-10-CM

## 2014-09-10 NOTE — Telephone Encounter (Signed)
[phone]  Pt is wanting to speak to you about when he should schedule his CPE since he has been sick

## 2014-09-10 NOTE — Telephone Encounter (Signed)
Pt called and CPE scheduled for 09/26/14

## 2014-09-20 ENCOUNTER — Other Ambulatory Visit: Payer: 59

## 2014-09-20 DIAGNOSIS — J45901 Unspecified asthma with (acute) exacerbation: Secondary | ICD-10-CM

## 2014-09-20 DIAGNOSIS — I10 Essential (primary) hypertension: Secondary | ICD-10-CM

## 2014-09-20 DIAGNOSIS — Z125 Encounter for screening for malignant neoplasm of prostate: Secondary | ICD-10-CM

## 2014-09-20 DIAGNOSIS — J441 Chronic obstructive pulmonary disease with (acute) exacerbation: Secondary | ICD-10-CM

## 2014-09-20 DIAGNOSIS — Z79899 Other long term (current) drug therapy: Secondary | ICD-10-CM

## 2014-09-20 DIAGNOSIS — K219 Gastro-esophageal reflux disease without esophagitis: Secondary | ICD-10-CM

## 2014-09-20 DIAGNOSIS — Z0000 Encounter for general adult medical examination without abnormal findings: Secondary | ICD-10-CM

## 2014-09-20 LAB — TSH: TSH: 2.514 u[IU]/mL (ref 0.350–4.500)

## 2014-09-20 LAB — COMPLETE METABOLIC PANEL WITH GFR
ALBUMIN: 3.6 g/dL (ref 3.5–5.2)
ALT: 30 U/L (ref 0–53)
AST: 16 U/L (ref 0–37)
Alkaline Phosphatase: 58 U/L (ref 39–117)
BUN: 19 mg/dL (ref 6–23)
CALCIUM: 9 mg/dL (ref 8.4–10.5)
CO2: 23 meq/L (ref 19–32)
CREATININE: 1.02 mg/dL (ref 0.50–1.35)
Chloride: 108 mEq/L (ref 96–112)
GFR, Est African American: 82 mL/min
GFR, Est Non African American: 71 mL/min
GLUCOSE: 75 mg/dL (ref 70–99)
POTASSIUM: 4.2 meq/L (ref 3.5–5.3)
Sodium: 143 mEq/L (ref 135–145)
Total Bilirubin: 1 mg/dL (ref 0.2–1.2)
Total Protein: 5.3 g/dL — ABNORMAL LOW (ref 6.0–8.3)

## 2014-09-20 LAB — CBC WITH DIFFERENTIAL/PLATELET
BASOS PCT: 0 % (ref 0–1)
Basophils Absolute: 0 10*3/uL (ref 0.0–0.1)
EOS ABS: 0.2 10*3/uL (ref 0.0–0.7)
Eosinophils Relative: 4 % (ref 0–5)
HCT: 52.1 % — ABNORMAL HIGH (ref 39.0–52.0)
Hemoglobin: 17.4 g/dL — ABNORMAL HIGH (ref 13.0–17.0)
Lymphocytes Relative: 26 % (ref 12–46)
Lymphs Abs: 1.5 10*3/uL (ref 0.7–4.0)
MCH: 33.6 pg (ref 26.0–34.0)
MCHC: 33.4 g/dL (ref 30.0–36.0)
MCV: 100.6 fL — ABNORMAL HIGH (ref 78.0–100.0)
MONO ABS: 0.6 10*3/uL (ref 0.1–1.0)
MONOS PCT: 10 % (ref 3–12)
MPV: 11.3 fL (ref 8.6–12.4)
NEUTROS PCT: 60 % (ref 43–77)
Neutro Abs: 3.4 10*3/uL (ref 1.7–7.7)
Platelets: 132 10*3/uL — ABNORMAL LOW (ref 150–400)
RBC: 5.18 MIL/uL (ref 4.22–5.81)
RDW: 14.4 % (ref 11.5–15.5)
WBC: 5.6 10*3/uL (ref 4.0–10.5)

## 2014-09-20 LAB — LIPID PANEL
Cholesterol: 158 mg/dL (ref 0–200)
HDL: 36 mg/dL — ABNORMAL LOW (ref >39)
LDL CALC: 88 mg/dL (ref 0–99)
TRIGLYCERIDES: 169 mg/dL — AB (ref <150)
Total CHOL/HDL Ratio: 4.4 Ratio
VLDL: 34 mg/dL (ref 0–40)

## 2014-09-21 LAB — PSA, MEDICARE: PSA: 0.97 ng/mL (ref <=4.00)

## 2014-09-26 ENCOUNTER — Ambulatory Visit (INDEPENDENT_AMBULATORY_CARE_PROVIDER_SITE_OTHER): Payer: 59 | Admitting: Family Medicine

## 2014-09-26 ENCOUNTER — Encounter: Payer: Self-pay | Admitting: Family Medicine

## 2014-09-26 VITALS — BP 140/60 | HR 84 | Temp 97.4°F | Resp 24 | Ht 66.5 in | Wt 136.0 lb

## 2014-09-26 DIAGNOSIS — Z0000 Encounter for general adult medical examination without abnormal findings: Secondary | ICD-10-CM

## 2014-09-26 MED ORDER — AZELASTINE HCL 0.1 % NA SOLN: 2.0000 | Freq: Two times a day (BID) | NASAL | Status: DC

## 2014-09-26 NOTE — Progress Notes (Signed)
Subjective:    Patient ID: Bruce Travis, male    DOB: 05/20/1935, 80 y.o.   MRN: [id number]  HPI Patient is here today for complete physical exam. Overall he is doing well however he reports severe dyspnea on exertion. Today on room air resting the patient is 93%. With ambulation greater than 100 feet his oxygen saturation drops to 84%. On 2 L via nasal cannula his oxygen saturation rises to 93% with ambulation. He is interested in portable oxygen. He denies any chest pain, angina, nausea, left arm pain. He has a history of severe COPD. Overall his immunizations are up-to-date. His PSA is normal. Due to his age and his severe pulmonary problems I do not think it is prudent for another screening colonoscopy. His most recent lab work as listed below. His cholesterol is outstanding and he is interested in discontinuing Lipitor: Appointment on 09/20/2014  Component Date Value Ref Range Status  . PSA 09/20/2014 0.97  <=4.00 ng/mL Final   Comment: Test Methodology: ECLIA PSA (Electrochemiluminescence Immunoassay)   For PSA values from 2.5-4.0, particularly in younger men <27 years old, the AUA and NCCN suggest testing for % Free PSA (3515) and evaluation of the rate of increase in PSA (PSA velocity).   . Sodium 09/20/2014 143  135 - 145 mEq/L Final  . Potassium 09/20/2014 4.2  3.5 - 5.3 mEq/L Final  . Chloride 09/20/2014 108  96 - 112 mEq/L Final  . CO2 09/20/2014 23  19 - 32 mEq/L Final  . Glucose, Bld 09/20/2014 75  70 - 99 mg/dL Final  . BUN 09/20/2014 19  6 - 23 mg/dL Final  . Creat 09/20/2014 1.02  0.50 - 1.35 mg/dL Final  . Total Bilirubin 09/20/2014 1.0  0.2 - 1.2 mg/dL Final  . Alkaline Phosphatase 09/20/2014 58  39 - 117 U/L Final  . AST 09/20/2014 16  0 - 37 U/L Final  . ALT 09/20/2014 30  0 - 53 U/L Final  . Total Protein 09/20/2014 5.3* 6.0 - 8.3 g/dL Final  . Albumin 09/20/2014 3.6  3.5 - 5.2 g/dL Final  . Calcium 09/20/2014 9.0  8.4 - 10.5 mg/dL Final  . GFR, Est African American  09/20/2014 82   Final  . GFR, Est Non African American 09/20/2014 71   Final   Comment:   The estimated GFR is a calculation valid for adults (>=62 years old) that uses the CKD-EPI algorithm to adjust for age and sex. It is   not to be used for children, pregnant women, hospitalized patients,    patients on dialysis, or with rapidly changing kidney function. According to the NKDEP, eGFR >89 is normal, 60-89 shows mild impairment, 30-59 shows moderate impairment, 15-29 shows severe impairment and <15 is ESRD.     . TSH 09/20/2014 2.514  0.350 - 4.500 uIU/mL Final  . Cholesterol 09/20/2014 158  0 - 200 mg/dL Final   Comment: ATP III Classification:       < 200        mg/dL        Desirable      200 - 239     mg/dL        Borderline High      >= 240        mg/dL        High     . Triglycerides 09/20/2014 169* <150 mg/dL Final  . HDL 09/20/2014 36* >39 mg/dL Final  . Total CHOL/HDL Ratio 09/20/2014 4.4  Final  . VLDL 09/20/2014 34  0 - 40 mg/dL Final  . LDL Cholesterol 09/20/2014 88  0 - 99 mg/dL Final   Comment:   Total Cholesterol/HDL Ratio:CHD Risk                        Coronary Heart Disease Risk Table                                        Men       Women          1/2 Average Risk              3.4        3.3              Average Risk              5.0        4.4           2X Average Risk              9.6        7.1           3X Average Risk             23.4       11.0 Use the calculated Patient Ratio above and the CHD Risk table  to determine the patient's CHD Risk. ATP III Classification (LDL):       < 100        mg/dL         Optimal      100 - 129     mg/dL         Near or Above Optimal      130 - 159     mg/dL         Borderline High      160 - 189     mg/dL         High       > 190        mg/dL         Very High     . WBC 09/20/2014 5.6  4.0 - 10.5 K/uL Final  . RBC 09/20/2014 5.18  4.22 - 5.81 MIL/uL Final  . Hemoglobin 09/20/2014 17.4* 13.0 - 17.0 g/dL Final  .  HCT 09/20/2014 52.1* 39.0 - 52.0 % Final  . MCV 09/20/2014 100.6* 78.0 - 100.0 fL Final  . MCH 09/20/2014 33.6  26.0 - 34.0 pg Final  . MCHC 09/20/2014 33.4  30.0 - 36.0 g/dL Final  . RDW 09/20/2014 14.4  11.5 - 15.5 % Final  . Platelets 09/20/2014 132* 150 - 400 K/uL Final  . MPV 09/20/2014 11.3  8.6 - 12.4 fL Final   ** Please note change in reference range(s). **  . Neutrophils Relative % 09/20/2014 60  43 - 77 % Final  . Neutro Abs 09/20/2014 3.4  1.7 - 7.7 K/uL Final  . Lymphocytes Relative 09/20/2014 26  12 - 46 % Final  . Lymphs Abs 09/20/2014 1.5  0.7 - 4.0 K/uL Final  . Monocytes Relative 09/20/2014 10  3 - 12 % Final  . Monocytes Absolute 09/20/2014 0.6  0.1 - 1.0 K/uL Final  . Eosinophils Relative 09/20/2014 4  0 - 5 % Final  . Eosinophils Absolute 09/20/2014  0.2  0.0 - 0.7 K/uL Final  . Basophils Relative 09/20/2014 0  0 - 1 % Final  . Basophils Absolute 09/20/2014 0.0  0.0 - 0.1 K/uL Final  . Smear Review 09/20/2014 Criteria for review not met   Final    Past Medical History  Diagnosis Date  . Personal history of colonic polyps 04/26/2007    hyperplastic   . Diverticulosis of colon (without mention of hemorrhage)   . Family hx of colon cancer   . COPD (chronic obstructive pulmonary disease)   . Esophageal reflux   . Hypertension   . Hyperlipemia   . CAD (coronary artery disease)   . Throat cancer   . Other diseases of lung, not elsewhere classified   . Unspecified asthma(493.90)   . Nephrolithiasis   . Allergic rhinitis   . Bronchiectasis   . Celiac disease    Past Surgical History  Procedure Laterality Date  . Vasectomy    . Head and neck surgery    . 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 Hemorrhoid surgery    . Hand surgery      d/t crush injury, right  . Tonsillectomy and adenoidectomy     Current Outpatient Prescriptions on File Prior to Visit  Medication Sig Dispense Refill  . albuterol (PROVENTIL HFA;VENTOLIN HFA) 108 (90 BASE) MCG/ACT  inhaler Inhale 2 puffs into the lungs every 4 (four) hours as needed. For shortness of breath 1 Inhaler 3  . albuterol (PROVENTIL) (2.5 MG/3ML) 0.083% nebulizer solution Take 3 mLs (2.5 mg total) by nebulization every 6 (six) hours as needed for wheezing or shortness of breath. 150 mL 1  . AMBULATORY NON FORMULARY MEDICATION O2 2 Lpm at night    . aspirin 81 MG tablet Take 81 mg by mouth daily.     Marland Kitchen atorvastatin (LIPITOR) 10 MG tablet Take 1 tablet (10 mg total) by mouth daily. 90 tablet 3  . Coenzyme Q10 100 MG capsule Take 200 mg by mouth daily.    Marland Kitchen dexlansoprazole (DEXILANT) 60 MG capsule Take 1 capsule (60 mg total) by mouth daily. 30 capsule 5  . famotidine (PEPCID) 20 MG tablet Take 20 mg by mouth at bedtime.    . fluticasone (FLONASE) 50 MCG/ACT nasal spray INSTILL 2 SPRAYS INTO EACH NOSTRIL DAILY. 16 g 11  . guaiFENesin (MUCINEX) 600 MG 12 hr tablet Take 2 tablets (1,200 mg total) by mouth 2 (two) times daily. 30 tablet 0  . HYDROcodone-homatropine (HYCODAN) 5-1.5 MG/5ML syrup Take 5 mLs by mouth every 6 (six) hours as needed for cough.    . montelukast (SINGULAIR) 10 MG tablet TAKE 1 TABLET BY MOUTH DAILY. 30 tablet 11  . naproxen (NAPROSYN) 500 MG tablet Take 1 tablet (500 mg total) by mouth 2 (two) times daily with a meal. 30 tablet 0  . oxyCODONE-acetaminophen (PERCOCET) 5-325 MG per tablet Take 1 tablet by mouth every 4 (four) hours as needed. 20 tablet 0  . SPIRIVA HANDIHALER 18 MCG inhalation capsule INHALE 1 CAPSULE ONCE DAILY AS DIRECTED 30 capsule 11  . SYMBICORT 160-4.5 MCG/ACT inhaler INHALE 2 PUFFS FIRST THING IN MORNING AND THEN ANOTHER 2 PUFFS ABOUT 12 HOURS LATER. 10.2 g 11  . valsartan (DIOVAN) 80 MG tablet TAKE ONE TABLET BY MOUTH DAILY. 90 tablet 3   No current facility-administered medications on file prior to visit.   Allergies  Allergen Reactions  . Codeine Other (See Comments)    Thought head was  going to blow off  . Gluten Meal Other (See Comments)     Celiac disease   History   Social History  . Marital Status: Married    Spouse Name: N/A    Number of Children: 3  . Years of Education: N/A   Occupational History  . retired     Psychologist, sport and exercise and Administrator   Social History Main Topics  . Smoking status: Former Smoker -- 2.00 packs/day for 35 years    Types: Cigarettes    Quit date: 01/21/1989  . Smokeless tobacco: Former Systems developer    Types: Chew    Quit date: 08/23/2001  . Alcohol Use: No  . Drug Use: No  . Sexual Activity: Not on file   Other Topics Concern  . Not on file   Social History Narrative   Family History  Problem Relation Age of Onset  . Heart disease Father   . Colon cancer Father   . Prostate cancer Father   . Stroke Mother   . Endometrial cancer Sister       Review of Systems  All other systems reviewed and are negative.      Objective:   Physical Exam  Constitutional: He is oriented to person, place, and time. He appears well-developed and well-nourished. No distress.  HENT:  Head: Normocephalic and atraumatic.  Right Ear: External ear normal.  Left Ear: External ear normal.  Nose: Nose normal.  Mouth/Throat: Oropharynx is clear and moist. No oropharyngeal exudate.  Eyes: Conjunctivae and EOM are normal. Pupils are equal, round, and reactive to light. Right eye exhibits no discharge. Left eye exhibits no discharge. No scleral icterus.  Neck: Normal range of motion. Neck supple. No JVD present. No tracheal deviation present. No thyromegaly present.  Cardiovascular: Normal rate, regular rhythm, normal heart sounds and intact distal pulses.  Exam reveals no gallop and no friction rub.   No murmur heard. Pulmonary/Chest: Effort normal. No stridor. He has decreased breath sounds. He has no wheezes. He has no rhonchi. He has no rales.  Abdominal: Soft. Bowel sounds are normal. He exhibits no distension and no mass. There is no tenderness. There is no rebound and no guarding.  Musculoskeletal: Normal  range of motion. He exhibits no edema or tenderness.  Lymphadenopathy:    He has no cervical adenopathy.  Neurological: He is alert and oriented to person, place, and time. He has normal reflexes. He displays normal reflexes. No cranial nerve deficit. He exhibits normal muscle tone. Coordination normal.  Skin: Skin is warm. No rash noted. He is not diaphoretic. No erythema. No pallor.  Psychiatric: He has a normal mood and affect. His behavior is normal. Judgment and thought content normal.  Vitals reviewed.         Assessment & Plan:  Routine general medical examination at a health care facility - Plan: azelastine (ASTELIN) 0.1 % nasal spray  Patient's physical exam is normal. I will give the patient Astelin for postnasal drip. I will also try to have the patient scheduled for portable oxygen. His immunizations are up-to-date. His cancer screening is up-to-date. His lab work is excellent. I believe the patient can discontinue Lipitor. I have recommended 2 L of oxygen via nasal cannula with ambulation.

## 2014-09-27 ENCOUNTER — Telehealth: Payer: Self-pay | Admitting: Family Medicine

## 2014-09-27 NOTE — Telephone Encounter (Signed)
Patient is calling to speak with you regarding your conversation this morning  [phone]

## 2014-09-27 NOTE — Telephone Encounter (Signed)
Pt upset that [facility] can not provide him with what he needs.  Has name of smaller equipment he wants for his portable oxygen.  Is going to call me back with name of it.

## 2014-09-30 NOTE — Telephone Encounter (Signed)
Pt has contacted [facility].  They have the smaller portable oxygen equipment that [facility] will not provide him.  Tells me they will be faxing [place] papers to get him transferred to them.

## 2014-10-10 ENCOUNTER — Encounter: Payer: Self-pay | Admitting: Family Medicine

## 2014-10-10 ENCOUNTER — Ambulatory Visit (INDEPENDENT_AMBULATORY_CARE_PROVIDER_SITE_OTHER): Payer: 59 | Admitting: Family Medicine

## 2014-10-10 VITALS — BP 128/74 | HR 62 | Temp 97.5°F | Resp 22 | Ht 66.5 in | Wt 136.0 lb

## 2014-10-10 DIAGNOSIS — J329 Chronic sinusitis, unspecified: Secondary | ICD-10-CM

## 2014-10-10 MED ORDER — AMOXICILLIN-POT CLAVULANATE 875-125 MG PO TABS: 1.0000 | ORAL_TABLET | Freq: Two times a day (BID) | ORAL | Status: DC

## 2014-10-10 NOTE — Telephone Encounter (Signed)
Pt switching to [facility].  Forms are here and being handled by Dr Samella Parr nurse.

## 2014-10-10 NOTE — Progress Notes (Signed)
Subjective:    Patient ID: Bruce Travis, male    DOB: 14-Nov-1934, 80 y.o.   MRN: [id number]  HPI Patient has had left maxillary and left frontal sinus congestion for the last 24 hours. He also reports postnasal drip and brown and green nasal drainage. He denies any sinus pain. He denies any fevers or chills. He denies any headache. He does have a cough productive of clear and yellow sputum that he attributed to postnasal drip. This is not his usual COPD mucus. This is mainly coming from his upper airways and in his throat. Past Medical History  Diagnosis Date  . Personal history of colonic polyps 04/26/2007    hyperplastic   . Diverticulosis of colon (without mention of hemorrhage)   . Family hx of colon cancer   . COPD (chronic obstructive pulmonary disease)   . Esophageal reflux   . Hypertension   . Hyperlipemia   . CAD (coronary artery disease)   . Throat cancer   . Other diseases of lung, not elsewhere classified   . Unspecified asthma(493.90)   . Nephrolithiasis   . Allergic rhinitis   . Bronchiectasis   . Celiac disease    Past Surgical History  Procedure Laterality Date  . Vasectomy    . Head and neck surgery    . 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 Hemorrhoid surgery    . Hand surgery      d/t crush injury, right  . Tonsillectomy and adenoidectomy     Current Outpatient Prescriptions on File Prior to Visit  Medication Sig Dispense Refill  . albuterol (PROVENTIL HFA;VENTOLIN HFA) 108 (90 BASE) MCG/ACT inhaler Inhale 2 puffs into the lungs every 4 (four) hours as needed. For shortness of breath 1 Inhaler 3  . albuterol (PROVENTIL) (2.5 MG/3ML) 0.083% nebulizer solution Take 3 mLs (2.5 mg total) by nebulization every 6 (six) hours as needed for wheezing or shortness of breath. 150 mL 1  . AMBULATORY NON FORMULARY MEDICATION O2 2 Lpm continuous    . aspirin 81 MG tablet Take 81 mg by mouth daily.     Marland Kitchen atorvastatin (LIPITOR) 10 MG tablet Take 1 tablet (10 mg total)  by mouth daily. 90 tablet 3  . azelastine (ASTELIN) 0.1 % nasal spray Place 2 sprays into both nostrils 2 (two) times daily. Use in each nostril as directed 30 mL 12  . Coenzyme Q10 100 MG capsule Take 200 mg by mouth daily.    Marland Kitchen dexlansoprazole (DEXILANT) 60 MG capsule Take 1 capsule (60 mg total) by mouth daily. 30 capsule 5  . fluticasone (FLONASE) 50 MCG/ACT nasal spray INSTILL 2 SPRAYS INTO EACH NOSTRIL DAILY. 16 g 11  . HYDROcodone-homatropine (HYCODAN) 5-1.5 MG/5ML syrup Take 5 mLs by mouth every 6 (six) hours as needed for cough.    . montelukast (SINGULAIR) 10 MG tablet TAKE 1 TABLET BY MOUTH DAILY. 30 tablet 11  . SPIRIVA HANDIHALER 18 MCG inhalation capsule INHALE 1 CAPSULE ONCE DAILY AS DIRECTED 30 capsule 11  . SYMBICORT 160-4.5 MCG/ACT inhaler INHALE 2 PUFFS FIRST THING IN MORNING AND THEN ANOTHER 2 PUFFS ABOUT 12 HOURS LATER. 10.2 g 11  . valsartan (DIOVAN) 80 MG tablet TAKE ONE TABLET BY MOUTH DAILY. 90 tablet 3  . guaiFENesin (MUCINEX) 600 MG 12 hr tablet Take 2 tablets (1,200 mg total) by mouth 2 (two) times daily. (Patient not taking: Reported on 10/10/2014) 30 tablet 0   No current facility-administered medications  on file prior to visit.   Allergies  Allergen Reactions  . Codeine Other (See Comments)    Thought head was going to blow off  . Gluten Meal Other (See Comments)    Celiac disease   History   Social History  . Marital Status: Married    Spouse Name: N/A  . Number of Children: 3  . Years of Education: N/A   Occupational History  . retired     Psychologist, sport and exercise and Administrator   Social History Main Topics  . Smoking status: Former Smoker -- 2.00 packs/day for 35 years    Types: Cigarettes    Quit date: 01/21/1989  . Smokeless tobacco: Former Systems developer    Types: Chew    Quit date: 08/23/2001  . Alcohol Use: No  . Drug Use: No  . Sexual Activity: Not on file   Other Topics Concern  . Not on file   Social History Narrative      Review of Systems  All  other systems reviewed and are negative.      Objective:   Physical Exam  Constitutional: He appears well-developed and well-nourished.  HENT:  Right Ear: External ear normal.  Left Ear: External ear normal.  Nose: Mucosal edema and rhinorrhea present.  Mouth/Throat: Oropharynx is clear and moist. No oropharyngeal exudate.  Neck: Neck supple.  Cardiovascular: Normal rate, regular rhythm and normal heart sounds.   Pulmonary/Chest: He has decreased breath sounds. He has no wheezes. He has no rhonchi. He has no rales.  Lymphadenopathy:    He has no cervical adenopathy.  Vitals reviewed.         Assessment & Plan:  Rhinosinusitis  I believe the patient has viral rhinosinusitis. I recommended tincture of time. I anticipate spontaneous resolution over the next 7 days. I recommended nasal saline 4 times a day. Patient can continue to use Mucinex to help thin the drainage. I would like him to stay away from [facility] given the medicine he takes for his COPD. I would like him to stay away from proceeding to avoid excessive dryness in his nasal passages. I prefer to allow this to drain. I did give the patient a prescription for Augmentin with explicit instructions on when to feel. If he develops fever, severe headache, severe sinus pain, I will treat the patient immediately given his propensity to decompensate quickly.

## 2014-10-16 ENCOUNTER — Other Ambulatory Visit: Payer: Self-pay | Admitting: Dermatology

## 2014-10-28 ENCOUNTER — Ambulatory Visit (INDEPENDENT_AMBULATORY_CARE_PROVIDER_SITE_OTHER): Payer: 59 | Admitting: Family Medicine

## 2014-10-28 DIAGNOSIS — Z23 Encounter for immunization: Secondary | ICD-10-CM | POA: Diagnosis not present

## 2014-11-25 ENCOUNTER — Ambulatory Visit (INDEPENDENT_AMBULATORY_CARE_PROVIDER_SITE_OTHER): Payer: Medicare Other | Admitting: Family Medicine

## 2014-11-25 ENCOUNTER — Encounter: Payer: Self-pay | Admitting: Family Medicine

## 2014-11-25 VITALS — BP 126/64 | HR 82 | Temp 98.2°F | Resp 20 | Ht 66.5 in | Wt 136.0 lb

## 2014-11-25 DIAGNOSIS — H6123 Impacted cerumen, bilateral: Secondary | ICD-10-CM

## 2014-11-25 NOTE — Progress Notes (Signed)
Subjective:    Patient ID: Bruce Travis, male    DOB: [birth year], 80 y.o.   MRN: [id number]  HPI Patient is a 80 year old white male who presents today with over a week of hearing loss due to "both the ears being stopped up". On examination he has bilateral cerumen impactions.  He denies any fever or otalgia. He is tried over-the-counter eardrops without success Past Medical History  Diagnosis Date  . Personal history of colonic polyps 04/26/2007    hyperplastic   . Diverticulosis of colon (without mention of hemorrhage)   . Family hx of colon cancer   . COPD (chronic obstructive pulmonary disease)   . Esophageal reflux   . Hypertension   . Hyperlipemia   . CAD (coronary artery disease)   . Throat cancer   . Other diseases of lung, not elsewhere classified   . Unspecified asthma(493.90)   . Nephrolithiasis   . Allergic rhinitis   . Bronchiectasis   . Celiac disease    Past Surgical History  Procedure Laterality Date  . Vasectomy    . Head and neck surgery    . 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 Hemorrhoid surgery    . Hand surgery      d/t crush injury, right  . Tonsillectomy and adenoidectomy     Current Outpatient Prescriptions on File Prior to Visit  Medication Sig Dispense Refill  . albuterol (PROVENTIL HFA;VENTOLIN HFA) 108 (90 BASE) MCG/ACT inhaler Inhale 2 puffs into the lungs every 4 (four) hours as needed. For shortness of breath 1 Inhaler 3  . albuterol (PROVENTIL) (2.5 MG/3ML) 0.083% nebulizer solution Take 3 mLs (2.5 mg total) by nebulization every 6 (six) hours as needed for wheezing or shortness of breath. 150 mL 1  . AMBULATORY NON FORMULARY MEDICATION O2 2 Lpm continuous    . amoxicillin-clavulanate (AUGMENTIN) 875-125 MG per tablet Take 1 tablet by mouth 2 (two) times daily. 20 tablet 0  . aspirin 81 MG tablet Take 81 mg by mouth daily.     Marland Kitchen atorvastatin (LIPITOR) 10 MG tablet Take 1 tablet (10 mg total) by mouth daily. 90 tablet 3  . azelastine  (ASTELIN) 0.1 % nasal spray Place 2 sprays into both nostrils 2 (two) times daily. Use in each nostril as directed 30 mL 12  . Coenzyme Q10 100 MG capsule Take 200 mg by mouth daily.    Marland Kitchen dexlansoprazole (DEXILANT) 60 MG capsule Take 1 capsule (60 mg total) by mouth daily. 30 capsule 5  . fluticasone (FLONASE) 50 MCG/ACT nasal spray INSTILL 2 SPRAYS INTO EACH NOSTRIL DAILY. 16 g 11  . guaiFENesin (MUCINEX) 600 MG 12 hr tablet Take 2 tablets (1,200 mg total) by mouth 2 (two) times daily. 30 tablet 0  . HYDROcodone-homatropine (HYCODAN) 5-1.5 MG/5ML syrup Take 5 mLs by mouth every 6 (six) hours as needed for cough.    . montelukast (SINGULAIR) 10 MG tablet TAKE 1 TABLET BY MOUTH DAILY. 30 tablet 11  . SPIRIVA HANDIHALER 18 MCG inhalation capsule INHALE 1 CAPSULE ONCE DAILY AS DIRECTED 30 capsule 11  . SYMBICORT 160-4.5 MCG/ACT inhaler INHALE 2 PUFFS FIRST THING IN MORNING AND THEN ANOTHER 2 PUFFS ABOUT 12 HOURS LATER. 10.2 g 11  . valsartan (DIOVAN) 80 MG tablet TAKE ONE TABLET BY MOUTH DAILY. 90 tablet 3   No current facility-administered medications on file prior to visit.   Allergies  Allergen Reactions  . Codeine Other (See Comments)  Thought head was going to blow off  . Gluten Meal Other (See Comments)    Celiac disease   History   Social History  . Marital Status: Married    Spouse Name: N/A  . Number of Children: 3  . Years of Education: N/A   Occupational History  . retired     Psychologist, sport and exercise and Administrator   Social History Main Topics  . Smoking status: Former Smoker -- 2.00 packs/day for 35 years    Types: Cigarettes    Quit date: 01/21/1989  . Smokeless tobacco: Former Systems developer    Types: Chew    Quit date: 08/23/2001  . Alcohol Use: No  . Drug Use: No  . Sexual Activity: Not on file   Other Topics Concern  . Not on file   Social History Narrative      Review of Systems  All other systems reviewed and are negative.      Objective:   Physical Exam    Cardiovascular: Normal rate, regular rhythm and normal heart sounds.   Pulmonary/Chest: Effort normal. He has decreased breath sounds. He has no wheezes.  Vitals reviewed.  bilateral cerumen impaction        Assessment & Plan:  Cerumen impaction, bilateral  Cerumen impaction was removed from each ear without difficulty using irrigation/UL lavage. The patient tolerated the procedure well without complication.

## 2014-12-30 ENCOUNTER — Telehealth: Payer: Self-pay | Admitting: Family Medicine

## 2014-12-30 NOTE — Telephone Encounter (Signed)
Patient is saying that he needs an oxygenator in order to fly please call him with questions at [phone] he says it needs to go to [facility]

## 2015-01-01 NOTE — Telephone Encounter (Signed)
Order faxed to [facility] with confirmation

## 2015-01-06 ENCOUNTER — Ambulatory Visit: Payer: Medicare Other | Admitting: Family Medicine

## 2015-01-10 ENCOUNTER — Telehealth: Payer: Self-pay | Admitting: Family Medicine

## 2015-01-10 MED ORDER — PANTOPRAZOLE SODIUM 40 MG PO TBEC: 40.0000 mg | DELAYED_RELEASE_TABLET | Freq: Every day | ORAL | Status: DC

## 2015-01-10 NOTE — Telephone Encounter (Signed)
Medication called/sent to requested pharmacy and pt aware 

## 2015-01-10 NOTE — Telephone Encounter (Signed)
Try protonix 40 poqday

## 2015-01-10 NOTE — Telephone Encounter (Signed)
Pt called and states that his Dexilant has gone up to $2.00 a pill and he can not afford that - he was on omeprazole in the past but with no help to his symptoms - and years ago took aciphex - can you change him to something else?

## 2015-01-29 ENCOUNTER — Telehealth: Payer: Self-pay | Admitting: Family Medicine

## 2015-01-29 NOTE — Telephone Encounter (Signed)
[place], patient left message on vm requesting speak with you - didn't state reason [phone]

## 2015-01-29 NOTE — Telephone Encounter (Signed)
Pt called and wanted to tell us that his plane ride went without a hitch and he really appreciated the letter for his O2 to get on plane.

## 2015-02-12 ENCOUNTER — Other Ambulatory Visit: Payer: Self-pay | Admitting: Family Medicine

## 2015-02-20 ENCOUNTER — Other Ambulatory Visit: Payer: Self-pay | Admitting: Family Medicine

## 2015-02-20 NOTE — Telephone Encounter (Signed)
Refill appropriate and filled per protocol. 

## 2015-02-27 ENCOUNTER — Other Ambulatory Visit: Payer: Self-pay | Admitting: Family Medicine

## 2015-03-07 ENCOUNTER — Other Ambulatory Visit: Payer: Self-pay | Admitting: Family Medicine

## 2015-03-07 NOTE — Telephone Encounter (Signed)
Medication refilled per protocol. 

## 2015-04-16 ENCOUNTER — Encounter: Payer: Self-pay | Admitting: Family Medicine

## 2015-04-16 ENCOUNTER — Ambulatory Visit (INDEPENDENT_AMBULATORY_CARE_PROVIDER_SITE_OTHER): Payer: Medicare Other | Admitting: Family Medicine

## 2015-04-16 VITALS — BP 138/72 | HR 72 | Temp 97.9°F | Resp 22 | Ht 66.5 in | Wt 132.0 lb

## 2015-04-16 DIAGNOSIS — G4700 Insomnia, unspecified: Secondary | ICD-10-CM

## 2015-04-16 DIAGNOSIS — J0100 Acute maxillary sinusitis, unspecified: Secondary | ICD-10-CM

## 2015-04-16 MED ORDER — GUAIFENESIN ER 600 MG PO TB12: 600.0000 mg | ORAL_TABLET | Freq: Two times a day (BID) | ORAL | Status: DC

## 2015-04-16 MED ORDER — AMOXICILLIN-POT CLAVULANATE 875-125 MG PO TABS: 1.0000 | ORAL_TABLET | Freq: Two times a day (BID) | ORAL | Status: DC

## 2015-04-16 MED ORDER — TRAZODONE HCL 50 MG PO TABS: 25.0000 mg | ORAL_TABLET | Freq: Every evening | ORAL | Status: DC | PRN

## 2015-04-16 NOTE — Patient Instructions (Signed)
Try the trazodone for sleep- start with 1/2 tablet Use mucinex, flonase, saline and antibiotics  F/U 4 weeks with Dr. Dennard Schaumann

## 2015-04-16 NOTE — Assessment & Plan Note (Signed)
Trial of trazodone start with 75m at bedtime,discussed medication with pt

## 2015-04-16 NOTE — Progress Notes (Signed)
Patient ID: Bruce Travis, male   DOB: [birth year], 80 y.o.   MRN: [id number]   Subjective:    Patient ID: Bruce Travis, male    DOB: May 31, 1935, 80 y.o.   MRN: [id number]  Patient presents for insomnia and Illness  patient here with a head cold for the past 10 days. He has some sinus pressure and drainage she has not had any fever his breathing appears to be at baseline but he has had some mild cough. This is how his typical COPD exacerbation start. He has been using a little Flonase recently.  His other concern is insomnia he's had problems sleeping for the past month or so he states he has taken Tylenol along with Benadryl this helped for about a week now he wakes up in the middle the morning in the makes him very tired through the day. He does not take any naps during the daytime. He denies drinking any caffeine prior to bedtime. He has not had any other medication changes recently.    Review Of Systems:  GEN- denies fatigue, fever, weight loss,weakness, recent illness HEENT- denies eye drainage, change in vision,+nasal discharge, CVS- denies chest pain, palpitations RESP- + SOB, +cough, wheeze ABD- denies N/V, change in stools, abd pain GU- denies dysuria, hematuria, dribbling, incontinence MSK- denies joint pain, muscle aches, injury Neuro- denies headache, dizziness, syncope, seizure activity       Objective:    BP 138/72 mmHg  Pulse 72  Temp(Src) 97.9 F (36.6 C) (Oral)  Resp 22  Ht 5' 6.5" (1.689 m)  Wt 132 lb (59.875 kg)  BMI 20.99 kg/m2  SpO2 90% GEN- NAD, alert and oriented x3 HEENT- PERRL, EOMI, non injected sclera, pink conjunctiva, MMM, oropharynx clear, +rhinorrhea, inflammed turbinates, mild maxillary sinus tenderness, wearing oxygen Neck- Supple, no LAD CVS- RRR, no murmur RESP-no wheeze, decreased BS at bases, normal WOB 2 L oxygen, speaking in full sentences EXT- No edema Pulses- Radial 2+        Assessment & Plan:      Problem List Items Addressed This  Visit    Insomnia - Primary    Other Visit Diagnoses    Acute maxillary sinusitis, recurrence not specified        Based on history and durationof symptoms, add augmentin, flonase, mucinex    Relevant Medications    guaiFENesin (MUCINEX) 600 MG 12 hr tablet    amoxicillin-clavulanate (AUGMENTIN) 875-125 MG per tablet       Note: This dictation was prepared with Dragon dictation along with smaller phrase technology. Any transcriptional errors that result from this process are unintentional.

## 2015-05-01 ENCOUNTER — Other Ambulatory Visit: Payer: Self-pay | Admitting: Family Medicine

## 2015-05-01 MED ORDER — FLUTICASONE PROPIONATE 50 MCG/ACT NA SUSP: NASAL | Status: DC

## 2015-05-12 ENCOUNTER — Ambulatory Visit (INDEPENDENT_AMBULATORY_CARE_PROVIDER_SITE_OTHER): Payer: Medicare Other | Admitting: *Deleted

## 2015-05-12 DIAGNOSIS — Z23 Encounter for immunization: Secondary | ICD-10-CM | POA: Diagnosis not present

## 2015-05-12 NOTE — Progress Notes (Signed)
Patient ID: Bruce Travis, male   DOB: May 02, 1935, 80 y.o.   MRN: [id number]  Patient seen in office for Influenza Vaccination.   Tolerated IM administration well.   Immunization history updated.

## 2015-05-15 ENCOUNTER — Ambulatory Visit: Payer: Medicare Other | Admitting: Family Medicine

## 2015-06-16 ENCOUNTER — Other Ambulatory Visit: Payer: Self-pay | Admitting: Family Medicine

## 2015-06-16 NOTE — Telephone Encounter (Signed)
Medication refilled per protocol. 

## 2015-06-24 ENCOUNTER — Ambulatory Visit (INDEPENDENT_AMBULATORY_CARE_PROVIDER_SITE_OTHER): Payer: Medicare Other | Admitting: Family Medicine

## 2015-06-24 ENCOUNTER — Encounter: Payer: Self-pay | Admitting: Family Medicine

## 2015-06-24 VITALS — BP 150/80 | HR 70 | Temp 98.4°F | Resp 20 | Ht 66.5 in | Wt 132.0 lb

## 2015-06-24 DIAGNOSIS — J690 Pneumonitis due to inhalation of food and vomit: Secondary | ICD-10-CM | POA: Diagnosis not present

## 2015-06-24 MED ORDER — PREDNISONE 20 MG PO TABS: ORAL_TABLET | ORAL | Status: DC

## 2015-06-24 MED ORDER — AMOXICILLIN-POT CLAVULANATE 875-125 MG PO TABS: 1.0000 | ORAL_TABLET | Freq: Two times a day (BID) | ORAL | Status: DC

## 2015-06-24 MED ORDER — CEFTRIAXONE SODIUM 1 G IJ SOLR
1.0000 g | Freq: Once | INTRAMUSCULAR | Status: AC
  Administered 2015-06-24: 1 g via INTRAMUSCULAR

## 2015-06-24 MED ORDER — METHYLPREDNISOLONE ACETATE 80 MG/ML IJ SUSP
80.0000 mg | Freq: Once | INTRAMUSCULAR | Status: AC
  Administered 2015-06-24: 80 mg via INTRAMUSCULAR

## 2015-06-24 NOTE — Progress Notes (Signed)
Subjective:    Patient ID: Bruce Travis, male    DOB: [birth year], 80 y.o.   MRN: [id number]  HPI Patient states that last night during supper, he aspirated. Ever since he has had increasing shortness of breath. This morning it was worse. He is audibly wheezing. He has diminished breath sounds bilaterally. He reports discomfort in the left side of his chest posteriorly and anteriorly. He reports increasing cough which is nonproductive. He denies any fevers or chills. He denies pleurisy or hemoptysis. Past Medical History  Diagnosis Date  . Personal history of colonic polyps 04/26/2007    hyperplastic   . Diverticulosis of colon (without mention of hemorrhage)   . Family hx of colon cancer   . COPD (chronic obstructive pulmonary disease) ([place])   . Esophageal reflux   . Hypertension   . Hyperlipemia   . CAD (coronary artery disease)   . Throat cancer ([place])   . Other diseases of lung, not elsewhere classified   . Unspecified asthma(493.90)   . Nephrolithiasis   . Allergic rhinitis   . Bronchiectasis   . Celiac disease    Past Surgical History  Procedure Laterality Date  . Vasectomy    . Head and neck surgery    . 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 Hemorrhoid surgery    . Hand surgery      d/t crush injury, right  . Tonsillectomy and adenoidectomy     Current Outpatient Prescriptions on File Prior to Visit  Medication Sig Dispense Refill  . albuterol (PROVENTIL) (2.5 MG/3ML) 0.083% nebulizer solution Take 3 mLs (2.5 mg total) by nebulization every 6 (six) hours as needed for wheezing or shortness of breath. 150 mL 1  . AMBULATORY NON FORMULARY MEDICATION O2 2 Lpm continuous    . aspirin 81 MG tablet Take 81 mg by mouth daily.     Marland Kitchen azelastine (ASTELIN) 0.1 % nasal spray Place 2 sprays into both nostrils 2 (two) times daily. Use in each nostril as directed 30 mL 12  . Coenzyme Q10 100 MG capsule Take 200 mg by mouth daily.    . fluticasone (FLONASE) 50 MCG/ACT nasal spray  INSTILL 2 SPRAYS INTO EACH NOSTRIL DAILY. 16 g 11  . guaiFENesin (MUCINEX) 600 MG 12 hr tablet Take 1 tablet (600 mg total) by mouth 2 (two) times daily. 60 tablet 6  . montelukast (SINGULAIR) 10 MG tablet TAKE 1 TABLET BY MOUTH DAILY. 90 tablet 3  . pantoprazole (PROTONIX) 40 MG tablet Take 1 tablet (40 mg total) by mouth daily. 30 tablet 11  . PROAIR HFA 108 (90 BASE) MCG/ACT inhaler INHALE 2 PUFFS EVERY 4 HOURS AS NEEDED FOR SHORTNESS OF BREATH. 8.5 g 5  . SPIRIVA HANDIHALER 18 MCG inhalation capsule INHALE 1 CAPSULE ONCE DAILY AS DIRECTED 30 capsule 11  . SYMBICORT 160-4.5 MCG/ACT inhaler INHALE 2 PUFFS FIRST THING IN MORNING AND THEN ANOTHER 2 PUFFS ABOUT 12 HOURS LATER. 10.2 g 5  . traZODone (DESYREL) 50 MG tablet Take 0.5-1 tablets (25-50 mg total) by mouth at bedtime as needed for sleep. 30 tablet 3  . valsartan (DIOVAN) 80 MG tablet TAKE ONE TABLET BY MOUTH DAILY. 90 tablet 3  . HYDROcodone-homatropine (HYCODAN) 5-1.5 MG/5ML syrup Take 5 mLs by mouth every 6 (six) hours as needed for cough.     No current facility-administered medications on file prior to visit.   Allergies  Allergen Reactions  . Codeine Other (See Comments)  Thought head was going to blow off  . Gluten Meal Other (See Comments)    Celiac disease   Social History   Social History  . Marital Status: Married    Spouse Name: N/A  . Number of Children: 3  . Years of Education: N/A   Occupational History  . retired     Psychologist, sport and exercise and Administrator   Social History Main Topics  . Smoking status: Former Smoker -- 2.00 packs/day for 35 years    Types: Cigarettes    Quit date: 01/21/1989  . Smokeless tobacco: Former Systems developer    Types: Chew    Quit date: 08/23/2001  . Alcohol Use: No  . Drug Use: No  . Sexual Activity: Not on file   Other Topics Concern  . Not on file   Social History Narrative      Review of Systems  All other systems reviewed and are negative.      Objective:   Physical Exam    Constitutional: He appears well-developed and well-nourished.  Cardiovascular: Normal rate and normal heart sounds.   Pulmonary/Chest: He has decreased breath sounds. He has wheezes. He has rhonchi in the right lower field and the left lower field.  Vitals reviewed.         Assessment & Plan:  Aspiration pneumonia of left lower lobe due to gastric secretions (HCC) - Plan: amoxicillin-clavulanate (AUGMENTIN) 875-125 MG tablet, predniSONE (DELTASONE) 20 MG tablet  I'm concerned the patient may have aspirated and developed aspiration pneumonia. Begin Augmentin 875 mg by mouth twice a day to cover gram positives, gram negatives, and anaerobes. Begin a prednisone taper pack as this is triggered a COPD exacerbation. He received 1 g of Rocephin IM 1 now and also is received 80 mg of Depo-Medrol IM 1 now. Begin the prednisone taper pack tomorrow. Start Augmentin tonight. Use albuterol 2 puffs inhaled every 4-6 hours as needed. Recheck on Thursday or immediately if worse

## 2015-06-24 NOTE — Addendum Note (Signed)
Addended by: Shary Decamp B on: 06/24/2015 04:13 PM   Modules accepted: Orders

## 2015-06-26 ENCOUNTER — Ambulatory Visit: Payer: Medicare Other | Admitting: Family Medicine

## 2015-06-26 ENCOUNTER — Ambulatory Visit (INDEPENDENT_AMBULATORY_CARE_PROVIDER_SITE_OTHER): Payer: Medicare Other | Admitting: Family Medicine

## 2015-06-26 ENCOUNTER — Encounter: Payer: Self-pay | Admitting: Family Medicine

## 2015-06-26 VITALS — BP 132/78 | HR 70 | Temp 98.0°F | Resp 20 | Ht 66.5 in | Wt 133.0 lb

## 2015-06-26 DIAGNOSIS — J441 Chronic obstructive pulmonary disease with (acute) exacerbation: Secondary | ICD-10-CM | POA: Diagnosis not present

## 2015-06-26 MED ORDER — PREDNISONE 10 MG PO TABS: 10.0000 mg | ORAL_TABLET | Freq: Every day | ORAL | Status: DC

## 2015-06-26 NOTE — Progress Notes (Signed)
Subjective:    Patient ID: Bruce Travis, male    DOB: 07-Jul-1935, 80 y.o.   MRN: [id number]  HPI 06/24/15 Patient states that last night during supper, he aspirated. Ever since he has had increasing shortness of breath. This morning it was worse. He is audibly wheezing. He has diminished breath sounds bilaterally. He reports discomfort in the left side of his chest posteriorly and anteriorly. He reports increasing cough which is nonproductive. He denies any fevers or chills. He denies pleurisy or hemoptysis.  At that time, my plan was: I'm concerned the patient may have aspirated and developed aspiration pneumonia. Begin Augmentin 875 mg by mouth twice a day to cover gram positives, gram negatives, and anaerobes. Begin a prednisone taper pack as this is triggered a COPD exacerbation. He received 1 g of Rocephin IM 1 now and also is received 80 mg of Depo-Medrol IM 1 now. Begin the prednisone taper pack tomorrow. Start Augmentin tonight. Use albuterol 2 puffs inhaled every 4-6 hours as needed. Recheck on Thursday or immediately if worse. 06/26/15 Patient is doing much better. Air movement on exam today has improved dramatically. His hypoxia has improved dramatically. He feels much better. He is afebrile. He continues to have a cough productive of mucus but this is improving. Past Medical History  Diagnosis Date  . Personal history of colonic polyps 04/26/2007    hyperplastic   . Diverticulosis of colon (without mention of hemorrhage)   . Family hx of colon cancer   . COPD (chronic obstructive pulmonary disease) ([place])   . Esophageal reflux   . Hypertension   . Hyperlipemia   . CAD (coronary artery disease)   . Throat cancer ([place])   . Other diseases of lung, not elsewhere classified   . Unspecified asthma(493.90)   . Nephrolithiasis   . Allergic rhinitis   . Bronchiectasis   . Celiac disease    Past Surgical History  Procedure Laterality Date  . Vasectomy    . Head and neck surgery    .  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 Hemorrhoid surgery    . Hand surgery      d/t crush injury, right  . Tonsillectomy and adenoidectomy     Current Outpatient Prescriptions on File Prior to Visit  Medication Sig Dispense Refill  . albuterol (PROVENTIL) (2.5 MG/3ML) 0.083% nebulizer solution Take 3 mLs (2.5 mg total) by nebulization every 6 (six) hours as needed for wheezing or shortness of breath. 150 mL 1  . AMBULATORY NON FORMULARY MEDICATION O2 2 Lpm continuous    . amoxicillin-clavulanate (AUGMENTIN) 875-125 MG tablet Take 1 tablet by mouth 2 (two) times daily. 20 tablet 0  . aspirin 81 MG tablet Take 81 mg by mouth daily.     Marland Kitchen azelastine (ASTELIN) 0.1 % nasal spray Place 2 sprays into both nostrils 2 (two) times daily. Use in each nostril as directed 30 mL 12  . Coenzyme Q10 100 MG capsule Take 200 mg by mouth daily.    . fluticasone (FLONASE) 50 MCG/ACT nasal spray INSTILL 2 SPRAYS INTO EACH NOSTRIL DAILY. 16 g 11  . guaiFENesin (MUCINEX) 600 MG 12 hr tablet Take 1 tablet (600 mg total) by mouth 2 (two) times daily. 60 tablet 6  . HYDROcodone-homatropine (HYCODAN) 5-1.5 MG/5ML syrup Take 5 mLs by mouth every 6 (six) hours as needed for cough.    . montelukast (SINGULAIR) 10 MG tablet TAKE 1 TABLET BY MOUTH DAILY. [place]  tablet 3  . pantoprazole (PROTONIX) 40 MG tablet Take 1 tablet (40 mg total) by mouth daily. 30 tablet 11  . predniSONE (DELTASONE) 20 MG tablet 3 tabs poqday 1-2, 2 tabs poqday 3-4, 1 tab poqday 5-6 12 tablet 0  . PROAIR HFA 108 (90 BASE) MCG/ACT inhaler INHALE 2 PUFFS EVERY 4 HOURS AS NEEDED FOR SHORTNESS OF BREATH. 8.5 g 5  . SPIRIVA HANDIHALER 18 MCG inhalation capsule INHALE 1 CAPSULE ONCE DAILY AS DIRECTED 30 capsule 11  . SYMBICORT 160-4.5 MCG/ACT inhaler INHALE 2 PUFFS FIRST THING IN MORNING AND THEN ANOTHER 2 PUFFS ABOUT 12 HOURS LATER. 10.2 g 5  . traZODone (DESYREL) 50 MG tablet Take 0.5-1 tablets (25-50 mg total) by mouth at bedtime as needed for  sleep. 30 tablet 3  . valsartan (DIOVAN) 80 MG tablet TAKE ONE TABLET BY MOUTH DAILY. 90 tablet 3   No current facility-administered medications on file prior to visit.   Allergies  Allergen Reactions  . Codeine Other (See Comments)    Thought head was going to blow off  . Gluten Meal Other (See Comments)    Celiac disease   Social History   Social History  . Marital Status: Married    Spouse Name: N/A  . Number of Children: 3  . Years of Education: N/A   Occupational History  . retired     Psychologist, sport and exercise and Administrator   Social History Main Topics  . Smoking status: Former Smoker -- 2.00 packs/day for 35 years    Types: Cigarettes    Quit date: 01/21/1989  . Smokeless tobacco: Former Systems developer    Types: Chew    Quit date: 08/23/2001  . Alcohol Use: No  . Drug Use: No  . Sexual Activity: Not on file   Other Topics Concern  . Not on file   Social History Narrative      Review of Systems  All other systems reviewed and are negative.      Objective:   Physical Exam  Constitutional: He appears well-developed and well-nourished.  Cardiovascular: Normal rate and normal heart sounds.   Pulmonary/Chest: No accessory muscle usage. No tachypnea. No respiratory distress. He has wheezes. He has no rhonchi.  Vitals reviewed.         Assessment & Plan:  COPD exacerbation ([place]) - Plan: predniSONE (DELTASONE) 10 MG tablet  Patient is doing much better. Finish off Augmentin to treat aspiration pneumonia. Complete prednisone taper pack. In the past we have abruptly discontinued steroids, the patient has deteriorated quickly and therefore I will keep him on 10 mg of prednisone for 7 days additional. He can then stop the prednisone altogether. Follow up immediately if things worsen.

## 2015-06-27 ENCOUNTER — Other Ambulatory Visit: Payer: Self-pay | Admitting: Family Medicine

## 2015-06-27 ENCOUNTER — Telehealth: Payer: Self-pay | Admitting: Family Medicine

## 2015-06-27 DIAGNOSIS — R911 Solitary pulmonary nodule: Secondary | ICD-10-CM

## 2015-06-27 NOTE — Telephone Encounter (Signed)
Patient does not want to talk to you only, not a nurse he would like to talk to you regarding an xray  [phone]

## 2015-06-30 ENCOUNTER — Ambulatory Visit
Admission: RE | Admit: 2015-06-30 | Discharge: 2015-06-30 | Disposition: A | Discharge status: Home / Self Care | Payer: Medicare Other | Source: Ambulatory Visit | Attending: Family Medicine | Admitting: Family Medicine

## 2015-06-30 DIAGNOSIS — R911 Solitary pulmonary nodule: Secondary | ICD-10-CM

## 2015-07-30 ENCOUNTER — Other Ambulatory Visit: Payer: Self-pay | Admitting: Family Medicine

## 2015-07-30 NOTE — Telephone Encounter (Signed)
?   OK to Refill  

## 2015-07-31 ENCOUNTER — Telehealth: Payer: Self-pay | Admitting: Family Medicine

## 2015-07-31 MED ORDER — HYDROCODONE-HOMATROPINE 5-1.5 MG/5ML PO SYRP
5.0000 mL | ORAL_SOLUTION | Freq: Four times a day (QID) | ORAL | Status: DC | PRN
Start: 2015-07-31 — End: 2015-11-20

## 2015-07-31 NOTE — Telephone Encounter (Signed)
ok 

## 2015-07-31 NOTE — Telephone Encounter (Signed)
Ok He is on hydromet

## 2015-07-31 NOTE — Telephone Encounter (Signed)
Patient needs a refill on his cough medication if possible  [phone]

## 2015-07-31 NOTE — Telephone Encounter (Signed)
?   OK to Refill  

## 2015-07-31 NOTE — Telephone Encounter (Signed)
RX printed, left up front and patient aware to pick up  

## 2015-08-14 ENCOUNTER — Other Ambulatory Visit: Payer: Self-pay | Admitting: Family Medicine

## 2015-10-31 ENCOUNTER — Ambulatory Visit: Payer: Medicare Other | Admitting: Family Medicine

## 2015-11-20 ENCOUNTER — Encounter: Payer: Self-pay | Admitting: Family Medicine

## 2015-11-20 ENCOUNTER — Ambulatory Visit (INDEPENDENT_AMBULATORY_CARE_PROVIDER_SITE_OTHER): Payer: Medicare Other | Admitting: Family Medicine

## 2015-11-20 VITALS — BP 150/90 | HR 51 | Temp 98.6°F | Resp 18 | Ht 66.5 in | Wt 138.0 lb

## 2015-11-20 DIAGNOSIS — J441 Chronic obstructive pulmonary disease with (acute) exacerbation: Secondary | ICD-10-CM

## 2015-11-20 DIAGNOSIS — I499 Cardiac arrhythmia, unspecified: Secondary | ICD-10-CM

## 2015-11-20 MED ORDER — PREDNISONE 20 MG PO TABS: 60.0000 mg | ORAL_TABLET | Freq: Every day | ORAL | Status: DC

## 2015-11-20 MED ORDER — IPRATROPIUM-ALBUTEROL 0.5-2.5 (3) MG/3ML IN SOLN: 3.0000 mL | Freq: Four times a day (QID) | RESPIRATORY_TRACT | Status: DC | PRN

## 2015-11-20 MED ORDER — LEVOFLOXACIN 500 MG PO TABS: 500.0000 mg | ORAL_TABLET | Freq: Every day | ORAL | Status: DC

## 2015-11-20 MED ORDER — METHYLPREDNISOLONE ACETATE 80 MG/ML IJ SUSP
80.0000 mg | Freq: Once | INTRAMUSCULAR | Status: AC
  Administered 2015-11-20: 80 mg via INTRAMUSCULAR

## 2015-11-20 NOTE — Progress Notes (Signed)
Subjective:    Patient ID: Bruce Travis, male    DOB: [birth year], 81 y.o.   MRN: [id number]  HPI Patient developed his typical symptoms of shortness of breath approximately one week ago. He has experienced increased coughing, increased chest congestion, cough productive of yellow sputum, increased wheezing. He is developed severe dyspnea on exertion with easy fatigability. Pulse oximetry is 91% on 2 L at rest. However on examination he has markedly diminished breath sounds with expiratory wheezing. He also has a very irregular and slow heart rate. This is new for this patient. Past Medical History  Diagnosis Date  . Personal history of colonic polyps 04/26/2007    hyperplastic   . Diverticulosis of colon (without mention of hemorrhage)   . Family hx of colon cancer   . COPD (chronic obstructive pulmonary disease) ([place])   . Esophageal reflux   . Hypertension   . Hyperlipemia   . CAD (coronary artery disease)   . Throat cancer ([place])   . Other diseases of lung, not elsewhere classified   . Unspecified asthma(493.90)   . Nephrolithiasis   . Allergic rhinitis   . Bronchiectasis   . Celiac disease    Past Surgical History  Procedure Laterality Date  . Vasectomy    . Head and neck surgery    . 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 Hemorrhoid surgery    . Hand surgery      d/t crush injury, right  . Tonsillectomy and adenoidectomy     Current Outpatient Prescriptions on File Prior to Visit  Medication Sig Dispense Refill  . AMBULATORY NON FORMULARY MEDICATION O2 2 Lpm continuous    . amoxicillin-clavulanate (AUGMENTIN) 875-125 MG tablet Take 1 tablet by mouth 2 (two) times daily. 20 tablet 0  . aspirin 81 MG tablet Take 81 mg by mouth daily.     Marland Kitchen guaiFENesin (MUCINEX) 600 MG 12 hr tablet Take by mouth 2 (two) times daily.    Marland Kitchen HYDROcodone-homatropine (HYCODAN) 5-1.5 MG/5ML syrup Take 5 mLs by mouth every 6 (six) hours as needed for cough. 120 mL 0  . montelukast (SINGULAIR) 10  MG tablet TAKE 1 TABLET BY MOUTH DAILY. 90 tablet 3  . pantoprazole (PROTONIX) 40 MG tablet Take 1 tablet (40 mg total) by mouth daily. 30 tablet 11  . predniSONE (DELTASONE) 10 MG tablet Take 1 tablet (10 mg total) by mouth daily with breakfast. 7 tablet 0  . predniSONE (DELTASONE) 20 MG tablet 3 tabs poqday 1-2, 2 tabs poqday 3-4, 1 tab poqday 5-6 12 tablet 0  . PROAIR HFA 108 (90 BASE) MCG/ACT inhaler INHALE 2 PUFFS EVERY 4 HOURS AS NEEDED FOR SHORTNESS OF BREATH. 8.5 g 5  . SPIRIVA HANDIHALER 18 MCG inhalation capsule INHALE 1 CAPSULE ONCE DAILY AS DIRECTED 30 capsule 11  . SYMBICORT 160-4.5 MCG/ACT inhaler INHALE 2 PUFFS FIRST THING IN MORNING AND THEN ANOTHER 2 PUFFS ABOUT 12 HOURS LATER. 10.2 g 11  . valsartan (DIOVAN) 80 MG tablet TAKE ONE TABLET BY MOUTH DAILY. 90 tablet 3   No current facility-administered medications on file prior to visit.   Allergies  Allergen Reactions  . Codeine Other (See Comments)    Thought head was going to blow off  . Gluten Meal Other (See Comments)    Celiac disease   Social History   Social History  . Marital Status: Married    Spouse Name: N/A  . Number of Children: 3  .  Years of Education: N/A   Occupational History  . retired     Psychologist, sport and exercise and Administrator   Social History Main Topics  . Smoking status: Former Smoker -- 2.00 packs/day for 35 years    Types: Cigarettes    Quit date: 01/21/1989  . Smokeless tobacco: Former Systems developer    Types: Chew    Quit date: 08/23/2001  . Alcohol Use: No  . Drug Use: No  . Sexual Activity: Not on file   Other Topics Concern  . Not on file   Social History Narrative      Review of Systems  All other systems reviewed and are negative.      Objective:   Physical Exam  Constitutional: He appears well-developed and well-nourished.  Cardiovascular: Normal rate and normal heart sounds.   Pulmonary/Chest: No accessory muscle usage. No tachypnea. No respiratory distress. He has wheezes. He has no  rhonchi.  Vitals reviewed.  He has markedly diminished breath sounds bilaterally and also expiratory wheezes bilaterally. He has bradycardia within a regularly irregular heartbeat.  EKG shows sinus rhythm with frequent PVCs. He was monitored on a rhythm strip for several seconds and at times he is having bigeminy. However this is not a new phenomenon for this patient. What is a new finding or Q waves in leads 2,3 and aVF. This is new compared to an EKG from 2015 and suggests inferior infarction       Assessment & Plan:  COPD exacerbation ([place]) - Plan: levofloxacin (LEVAQUIN) 500 MG tablet, predniSONE (DELTASONE) 20 MG tablet, ipratropium-albuterol (DUONEB) 0.5-2.5 (3) MG/3ML SOLN  Irregular heart rate - Plan: EKG 12-Lead  Thankfully the patient is not in atrial fibrillation. His irregular heartbeat is due to PVCs and occasional runs of bigeminy. This is been chronic and is even present on his EKG from 2015. I will treat the patient is a COPD exacerbation with prednisone, Levaquin, and duo nebs. He is to use a DuoNeb every 6 hours. He was given 80 mg of Depo-Medrol IM 1 today. He will begin 60 mg of prednisone daily starting tomorrow. I will recheck the patient on Monday. He is to go to the hospital immediately if symptoms worsen. Once his breathing is better, I will recommend an outpatient cardiology evaluation to determine if he has suffered structural heart damage at some point in the last 2 years. However I believe this is a coincidental finding on today's exam

## 2015-11-20 NOTE — Addendum Note (Signed)
Addended by: Shary Decamp B on: 11/20/2015 01:45 PM   Modules accepted: Orders

## 2015-11-24 ENCOUNTER — Encounter: Payer: Self-pay | Admitting: Family Medicine

## 2015-11-24 ENCOUNTER — Telehealth: Payer: Self-pay | Admitting: *Deleted

## 2015-11-24 ENCOUNTER — Ambulatory Visit (INDEPENDENT_AMBULATORY_CARE_PROVIDER_SITE_OTHER): Payer: Medicare Other | Admitting: Family Medicine

## 2015-11-24 VITALS — BP 150/80 | HR 62 | Temp 98.7°F | Resp 24

## 2015-11-24 DIAGNOSIS — R9431 Abnormal electrocardiogram [ECG] [EKG]: Secondary | ICD-10-CM | POA: Diagnosis not present

## 2015-11-24 DIAGNOSIS — J441 Chronic obstructive pulmonary disease with (acute) exacerbation: Secondary | ICD-10-CM | POA: Diagnosis not present

## 2015-11-24 MED ORDER — PREDNISONE 20 MG PO TABS: 40.0000 mg | ORAL_TABLET | Freq: Every day | ORAL | Status: DC

## 2015-11-24 NOTE — Progress Notes (Signed)
Subjective:    Patient ID: Bruce Travis, male    DOB: [birth year], 81 y.o.   MRN: [id number]  HPI 11/20/15 Patient developed his typical symptoms of shortness of breath approximately one week ago. He has experienced increased coughing, increased chest congestion, cough productive of yellow sputum, increased wheezing. He is developed severe dyspnea on exertion with easy fatigability. Pulse oximetry is 91% on 2 L at rest. However on examination he has markedly diminished breath sounds with expiratory wheezing. He also has a very irregular and slow heart rate. This is new for this patient.  At that time, my plan was: Thankfully the patient is not in atrial fibrillation. His irregular heartbeat is due to PVCs and occasional runs of bigeminy. This is been chronic and is even present on his EKG from 2015. I will treat the patient is a COPD exacerbation with prednisone, Levaquin, and duo nebs. He is to use a DuoNeb every 6 hours. He was given 80 mg of Depo-Medrol IM 1 today. He will begin 60 mg of prednisone daily starting tomorrow. I will recheck the patient on Monday. He is to go to the hospital immediately if symptoms worsen. Once his breathing is better, I will recommend an outpatient cardiology evaluation to determine if he has suffered structural heart damage at some point in the last 2 years. However I believe this is a coincidental finding on today's exam  11/24/15 Overall breathing is better. Pulse oximetry is 95-96% on 2 L. This is much improved from 91% last week. He is still using duo nebs every 6 hours but he feels much better. Past Medical History  Diagnosis Date  . Personal history of colonic polyps 04/26/2007    hyperplastic   . Diverticulosis of colon (without mention of hemorrhage)   . Family hx of colon cancer   . COPD (chronic obstructive pulmonary disease) ([place])   . Esophageal reflux   . Hypertension   . Hyperlipemia   . CAD (coronary artery disease)   . Throat cancer ([place])   . Other  diseases of lung, not elsewhere classified   . Unspecified asthma(493.90)   . Nephrolithiasis   . Allergic rhinitis   . Bronchiectasis   . Celiac disease    Past Surgical History  Procedure Laterality Date  . Vasectomy    . Head and neck surgery    . 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 Hemorrhoid surgery    . Hand surgery      d/t crush injury, right  . Tonsillectomy and adenoidectomy     Current Outpatient Prescriptions on File Prior to Visit  Medication Sig Dispense Refill  . AMBULATORY NON FORMULARY MEDICATION O2 2 Lpm continuous    . aspirin 81 MG tablet Take 81 mg by mouth daily.     Marland Kitchen azelastine (ASTELIN) 0.1 % nasal spray Place 2 sprays into both nostrils 2 (two) times daily. Use in each nostril as directed    . ipratropium-albuterol (DUONEB) 0.5-2.5 (3) MG/3ML SOLN Take 3 mLs by nebulization every 6 (six) hours as needed. 360 mL 3  . levofloxacin (LEVAQUIN) 500 MG tablet Take 1 tablet (500 mg total) by mouth daily. 7 tablet 0  . montelukast (SINGULAIR) 10 MG tablet TAKE 1 TABLET BY MOUTH DAILY. 90 tablet 3  . pantoprazole (PROTONIX) 40 MG tablet Take 1 tablet (40 mg total) by mouth daily. 30 tablet 11  . predniSONE (DELTASONE) 20 MG tablet Take 3 tablets (60 mg total)  by mouth daily with breakfast. 21 tablet 0  . PROAIR HFA 108 (90 BASE) MCG/ACT inhaler INHALE 2 PUFFS EVERY 4 HOURS AS NEEDED FOR SHORTNESS OF BREATH. 8.5 g 5  . SPIRIVA HANDIHALER 18 MCG inhalation capsule INHALE 1 CAPSULE ONCE DAILY AS DIRECTED 30 capsule 11  . SYMBICORT 160-4.5 MCG/ACT inhaler INHALE 2 PUFFS FIRST THING IN MORNING AND THEN ANOTHER 2 PUFFS ABOUT 12 HOURS LATER. 10.2 g 11  . valsartan (DIOVAN) 80 MG tablet TAKE ONE TABLET BY MOUTH DAILY. 90 tablet 3   No current facility-administered medications on file prior to visit.   Allergies  Allergen Reactions  . Codeine Other (See Comments)    Thought head was going to blow off  . Gluten Meal Other (See Comments)    Celiac disease    Social History   Social History  . Marital Status: Married    Spouse Name: N/A  . Number of Children: 3  . Years of Education: N/A   Occupational History  . retired     Psychologist, sport and exercise and Administrator   Social History Main Topics  . Smoking status: Former Smoker -- 2.00 packs/day for 35 years    Types: Cigarettes    Quit date: 01/21/1989  . Smokeless tobacco: Former Systems developer    Types: Chew    Quit date: 08/23/2001  . Alcohol Use: No  . Drug Use: No  . Sexual Activity: Not on file   Other Topics Concern  . Not on file   Social History Narrative      Review of Systems  All other systems reviewed and are negative.      Objective:   Physical Exam  Constitutional: He appears well-developed and well-nourished.  Cardiovascular: Normal rate and normal heart sounds.   Pulmonary/Chest: No accessory muscle usage. No tachypnea. No respiratory distress. He has wheezes. He has no rhonchi.  Vitals reviewed.  He has markedly diminished breath sounds bilaterally.  Wheezes have improved. He has bradycardia within a regularly irregular heartbeat.         Assessment & Plan:  COPD exacerbation ([place]) - Plan: predniSONE (DELTASONE) 20 MG tablet  Nonspecific abnormal electrocardiogram (ECG) ([place]) - Plan: Ambulatory referral to Cardiology  Subjectively and objectively the patient is improving. In the past I had made a mistake in weaning him to quickly off his prednisone. Therefore we will wean the patient will gradually. Begin prednisone 40 mg by mouth daily for the next week and then decrease to 20 mg a day for the next week and then stop the medication. Start to decrease DuoNeb nebs to every 8 hours as needed. I will also consult cardiology given the possible inferior infarction seen on his EKG which I believe is a coincidental finding

## 2015-11-24 NOTE — Telephone Encounter (Signed)
Pt called stating is needing Nebulizer hose and mask because of leaking RX sent to [place] would like to have delivered. Please advise!  [employer] back number [phone]

## 2015-11-26 NOTE — Telephone Encounter (Signed)
Orders faxed to [place] [facility] per sabrina

## 2015-11-27 ENCOUNTER — Ambulatory Visit: Payer: Medicare Other | Admitting: *Deleted

## 2015-11-27 ENCOUNTER — Telehealth: Payer: Self-pay | Admitting: *Deleted

## 2015-11-27 VITALS — BP 122/80 | HR 93

## 2015-11-27 DIAGNOSIS — I499 Cardiac arrhythmia, unspecified: Secondary | ICD-10-CM

## 2015-11-27 NOTE — Telephone Encounter (Signed)
Tell him ok and to have fun at beach.

## 2015-11-27 NOTE — Telephone Encounter (Signed)
Pt came in today for check BP which was 122 80 left arm,pulse 62, O2 93 %, pt states he is wanting to go to beach on Monday- Friday,states he is feeling pretty good but wanted to ask you if you thought was ok.. See cardilogist on April 21 at [facility] and vascular.  Would like for Dr. Dennard Schaumann or Lovey Newcomer to give call.

## 2015-11-28 NOTE — Telephone Encounter (Signed)
Pt aware.

## 2015-12-09 ENCOUNTER — Encounter: Payer: Self-pay | Admitting: Family Medicine

## 2015-12-09 ENCOUNTER — Ambulatory Visit (INDEPENDENT_AMBULATORY_CARE_PROVIDER_SITE_OTHER): Payer: Medicare Other | Admitting: Family Medicine

## 2015-12-09 VITALS — BP 130/72 | HR 56 | Temp 97.9°F | Resp 20 | Ht 66.5 in | Wt 138.0 lb

## 2015-12-09 DIAGNOSIS — S41111A Laceration without foreign body of right upper arm, initial encounter: Secondary | ICD-10-CM | POA: Diagnosis not present

## 2015-12-09 NOTE — Progress Notes (Signed)
Subjective:    Patient ID: Bruce Travis, male    DOB: [birth year], 81 y.o.   MRN: [id number]  HPI The patient has a skin tear to his posterior right bicep. It happened approximately 2 weeks ago. It will not heal. It bleeds frequently. It is very superficial. It is jagged in shape and appearance. It is approximately 6 cm x 2 cm. There is no evidence of infection. Past Medical History  Diagnosis Date  . Personal history of colonic polyps 04/26/2007    hyperplastic   . Diverticulosis of colon (without mention of hemorrhage)   . Family hx of colon cancer   . COPD (chronic obstructive pulmonary disease) ([place])   . Esophageal reflux   . Hypertension   . Hyperlipemia   . CAD (coronary artery disease)   . Throat cancer ([place])   . Other diseases of lung, not elsewhere classified   . Unspecified asthma(493.90)   . Nephrolithiasis   . Allergic rhinitis   . Bronchiectasis   . Celiac disease    Past Surgical History  Procedure Laterality Date  . Vasectomy    . Head and neck surgery    . 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 Hemorrhoid surgery    . Hand surgery      d/t crush injury, right  . Tonsillectomy and adenoidectomy     Current Outpatient Prescriptions on File Prior to Visit  Medication Sig Dispense Refill  . AMBULATORY NON FORMULARY MEDICATION O2 2 Lpm continuous    . aspirin 81 MG tablet Take 81 mg by mouth daily.     Marland Kitchen azelastine (ASTELIN) 0.1 % nasal spray Place 2 sprays into both nostrils 2 (two) times daily. Use in each nostril as directed    . ipratropium-albuterol (DUONEB) 0.5-2.5 (3) MG/3ML SOLN Take 3 mLs by nebulization every 6 (six) hours as needed. 360 mL 3  . montelukast (SINGULAIR) 10 MG tablet TAKE 1 TABLET BY MOUTH DAILY. 90 tablet 3  . pantoprazole (PROTONIX) 40 MG tablet Take 1 tablet (40 mg total) by mouth daily. 30 tablet 11  . predniSONE (DELTASONE) 20 MG tablet Take 2 tablets (40 mg total) by mouth daily with breakfast. 40 tablet 0  . PROAIR HFA 108 (90  BASE) MCG/ACT inhaler INHALE 2 PUFFS EVERY 4 HOURS AS NEEDED FOR SHORTNESS OF BREATH. 8.5 g 5  . SPIRIVA HANDIHALER 18 MCG inhalation capsule INHALE 1 CAPSULE ONCE DAILY AS DIRECTED 30 capsule 11  . SYMBICORT 160-4.5 MCG/ACT inhaler INHALE 2 PUFFS FIRST THING IN MORNING AND THEN ANOTHER 2 PUFFS ABOUT 12 HOURS LATER. 10.2 g 11  . valsartan (DIOVAN) 80 MG tablet TAKE ONE TABLET BY MOUTH DAILY. 90 tablet 3   No current facility-administered medications on file prior to visit.   Allergies  Allergen Reactions  . Codeine Other (See Comments)    Thought head was going to blow off  . Gluten Meal Other (See Comments)    Celiac disease   Social History   Social History  . Marital Status: Married    Spouse Name: N/A  . Number of Children: 3  . Years of Education: N/A   Occupational History  . retired     Psychologist, sport and exercise and Administrator   Social History Main Topics  . Smoking status: Former Smoker -- 2.00 packs/day for 35 years    Types: Cigarettes    Quit date: 01/21/1989  . Smokeless tobacco: Former Systems developer    Types: Loss adjuster, chartered  Quit date: 08/23/2001  . Alcohol Use: No  . Drug Use: No  . Sexual Activity: Not on file   Other Topics Concern  . Not on file   Social History Narrative      Review of Systems  All other systems reviewed and are negative.      Objective:   Physical Exam  Cardiovascular: Normal rate, regular rhythm and normal heart sounds.   Pulmonary/Chest: Effort normal. He has wheezes.  Vitals reviewed.         Assessment & Plan:  Skin tear of upper arm without complication, right, initial encounter  Applied Tegaderm to the skin tear on his posterior right bicep. Change Tegaderm every 3 days. Wrapped bicep with Coban and to reinforce the Tegaderm. Recheck on Friday

## 2015-12-12 ENCOUNTER — Encounter: Payer: Self-pay | Admitting: Internal Medicine

## 2015-12-12 ENCOUNTER — Ambulatory Visit (INDEPENDENT_AMBULATORY_CARE_PROVIDER_SITE_OTHER): Payer: Medicare Other | Admitting: Internal Medicine

## 2015-12-12 ENCOUNTER — Ambulatory Visit: Payer: Medicare Other | Admitting: Family Medicine

## 2015-12-12 VITALS — BP 156/86 | HR 79 | Ht 66.0 in | Wt 133.0 lb

## 2015-12-12 DIAGNOSIS — I2510 Atherosclerotic heart disease of native coronary artery without angina pectoris: Secondary | ICD-10-CM

## 2015-12-12 DIAGNOSIS — R0602 Shortness of breath: Secondary | ICD-10-CM | POA: Diagnosis not present

## 2015-12-12 DIAGNOSIS — I491 Atrial premature depolarization: Secondary | ICD-10-CM | POA: Diagnosis not present

## 2015-12-12 DIAGNOSIS — I10 Essential (primary) hypertension: Secondary | ICD-10-CM

## 2015-12-12 DIAGNOSIS — R079 Chest pain, unspecified: Secondary | ICD-10-CM

## 2015-12-12 DIAGNOSIS — I493 Ventricular premature depolarization: Secondary | ICD-10-CM | POA: Diagnosis not present

## 2015-12-12 DIAGNOSIS — J449 Chronic obstructive pulmonary disease, unspecified: Secondary | ICD-10-CM

## 2015-12-12 NOTE — Patient Instructions (Signed)
Your physician has requested that you have an echocardiogram @ [address] - [address]. Echocardiography is a painless test that uses sound waves to create images of your heart. It provides your doctor with information about the size and shape of your heart and how well your heart's chambers and valves are working. This procedure takes approximately one hour. There are no restrictions for this procedure.  Your physician has requested that you have a lexiscan myoview. For further information please visit [email or web address]. Please follow instruction sheet, as given.  Your physician recommends that you schedule a follow-up appointment after your testing.

## 2015-12-12 NOTE — Progress Notes (Signed)
OFFICE NOTE  Chief Complaint:  PAC's and PVC's, abnormal ekg, DOE  Primary Care Physician: Odette Fraction, MD  HPI:  Bruce Travis is a 81 y.o. male his past medical history significant for hypertension, dyslipidemia (he was on an 69 month study with Lipitor in the 2000's), coronary artery disease which was not significant by report of cardiac catheterization in the early 2000's with Dr. Eustace Quail. He also has significant COPD which is oxygen dependent. Recently he was noted to have EKG changes including PACs and PVCs which were demonstrated on his EKG today. The rhythm appears to be sinus. He is not aware of these as far as being symptomatic. He does report shortness of breath which she says is been fairly stable and is improved on oxygen. He has infrequent chest pains, which are not always associated with exertion, but has numerous cardiac risk factors including a significant smoking history in the past although quit more than 20 years ago.  PMHx:  Past Medical History  Diagnosis Date  . Personal history of colonic polyps 04/26/2007    hyperplastic   . Diverticulosis of colon (without mention of hemorrhage)   . Family hx of colon cancer   . COPD (chronic obstructive pulmonary disease) ([place])   . Esophageal reflux   . Hypertension   . Hyperlipemia   . CAD (coronary artery disease)   . Throat cancer ([place])   . Other diseases of lung, not elsewhere classified   . Unspecified asthma(493.90)   . Nephrolithiasis   . Allergic rhinitis   . Bronchiectasis   . Celiac disease     Past Surgical History  Procedure Laterality Date  . Vasectomy    . Head and neck surgery    . 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 Hemorrhoid surgery    . Hand surgery      d/t crush injury, right  . Tonsillectomy and adenoidectomy      FAMHx:  Family History  Problem Relation Age of Onset  . Heart disease Father   . Colon cancer Father   . Prostate cancer Father   . Stroke Mother   .  Endometrial cancer Sister     SOCHx:   reports that he quit smoking about 26 years ago. His smoking use included Cigarettes. He has a 70 pack-year smoking history. He quit smokeless tobacco use about 14 years ago. His smokeless tobacco use included Chew. He reports that he does not drink alcohol or use illicit drugs.  ALLERGIES:  Allergies  Allergen Reactions  . Codeine Other (See Comments)    Thought head was going to blow off  . Gluten Meal Other (See Comments)    Celiac disease    ROS: Pertinent items noted in HPI and remainder of comprehensive ROS otherwise negative.  HOME MEDS: Current Outpatient Prescriptions  Medication Sig Dispense Refill  . AMBULATORY NON FORMULARY MEDICATION O2 2 Lpm continuous    . aspirin 81 MG tablet Take 81 mg by mouth daily.     Marland Kitchen azelastine (ASTELIN) 0.1 % nasal spray Place 2 sprays into both nostrils 2 (two) times daily. Use in each nostril as directed    . montelukast (SINGULAIR) 10 MG tablet TAKE 1 TABLET BY MOUTH DAILY. 90 tablet 3  . pantoprazole (PROTONIX) 40 MG tablet Take 1 tablet (40 mg total) by mouth daily. 30 tablet 11  . PROAIR HFA 108 (90 BASE) MCG/ACT inhaler INHALE 2 PUFFS EVERY 4 HOURS AS  NEEDED FOR SHORTNESS OF BREATH. 8.5 g 5  . SPIRIVA HANDIHALER 18 MCG inhalation capsule INHALE 1 CAPSULE ONCE DAILY AS DIRECTED 30 capsule 11  . SYMBICORT 160-4.5 MCG/ACT inhaler INHALE 2 PUFFS FIRST THING IN MORNING AND THEN ANOTHER 2 PUFFS ABOUT 12 HOURS LATER. 10.2 g 11  . valsartan (DIOVAN) 80 MG tablet TAKE ONE TABLET BY MOUTH DAILY. 90 tablet 3   No current facility-administered medications for this visit.    LABS/IMAGING: No results found for this or any previous visit (from the past 48 hour(s)). No results found.  WEIGHTS: Wt Readings from Last 3 Encounters:  12/12/15 133 lb (60.328 kg)  12/09/15 138 lb (62.596 kg)  11/20/15 138 lb (62.596 kg)    VITALS: BP 156/86 mmHg  Pulse 79  Ht 5' 6"  (1.676 m)  Wt 133 lb (60.328 kg)   BMI 21.48 kg/m2  EXAM: General appearance: alert and no distress Neck: no carotid bruit, no JVD and thyroid not enlarged, symmetric, no tenderness/mass/nodules Lungs: diminished breath sounds bilaterally and rhonchi bilaterally Heart: regularly irregular rhythm Abdomen: soft, non-tender; bowel sounds normal; no masses,  no organomegaly Extremities: edema trace Pulses: 2+ and symmetric Skin: Skin color, texture, turgor normal. No rashes or lesions Neurologic: Grossly normal Psych: Pleasant  EKG: Sinus rhythm at 79 with PVCs and PACs  ASSESSMENT: 1. Abnormal EKG with new PACs and PVCs 2. Dyspnea on exertion 3. Oxygen dependent COPD 4. Hypertension 5. History of dyslipidemia 6. History of mild coronary disease by cath in the early 2000's  PLAN: 1.   Mr. Axtman has numerous cardiovascular risk factors. He was noted to have mild coronary disease by cath nearly 2000's. We will try to obtain that report is is not in the Epic medical record. He was treated for dyslipidemia in the past on Lipitor but says he recently came off of cholesterol medicine within the past several years due to dietary changes for celiac disease. I would recommend nuclear stress testing as well as an echocardiogram to look for structural or ischemic changes of the heart that could be a cause of his new arrhythmias. He may need the addition of beta blocker to help control some of his extrasystoles. Most likely he would need a selective beta blocker that hopefully would not worsen his COPD.  Plan follow-up in a few weeks to review the results of the studies. Thanks again for the kind referral.  Pixie Casino, MD, [facility] Attending Cardiologist [place] 12/12/2015, 1:15 PM

## 2015-12-16 ENCOUNTER — Encounter: Payer: Self-pay | Admitting: *Deleted

## 2015-12-19 ENCOUNTER — Telehealth (HOSPITAL_COMMUNITY): Payer: Self-pay

## 2015-12-19 NOTE — Telephone Encounter (Signed)
Encounter complete. 

## 2015-12-22 ENCOUNTER — Telehealth: Payer: Self-pay | Admitting: Internal Medicine

## 2015-12-22 NOTE — Telephone Encounter (Signed)
Procedure and pre visit instructions clarified w/ patient, who verbalized understanding.

## 2015-12-22 NOTE — Telephone Encounter (Signed)
Pt calling re stress test on Wednesday-was told to wear comfortable clothing and walking shoes-thought he was having a chemical stress test-pls advise

## 2015-12-24 ENCOUNTER — Ambulatory Visit (HOSPITAL_COMMUNITY)
Admission: RE | Admit: 2015-12-24 | Discharge: 2015-12-24 | Disposition: A | Discharge status: Home / Self Care | Payer: Medicare Other | Source: Ambulatory Visit | Attending: Cardiovascular Disease | Admitting: Cardiovascular Disease

## 2015-12-24 DIAGNOSIS — R5383 Other fatigue: Secondary | ICD-10-CM | POA: Insufficient documentation

## 2015-12-24 DIAGNOSIS — I491 Atrial premature depolarization: Secondary | ICD-10-CM | POA: Insufficient documentation

## 2015-12-24 DIAGNOSIS — R0602 Shortness of breath: Secondary | ICD-10-CM | POA: Insufficient documentation

## 2015-12-24 DIAGNOSIS — I493 Ventricular premature depolarization: Secondary | ICD-10-CM | POA: Diagnosis not present

## 2015-12-24 DIAGNOSIS — Z87891 Personal history of nicotine dependence: Secondary | ICD-10-CM | POA: Diagnosis not present

## 2015-12-24 DIAGNOSIS — R0609 Other forms of dyspnea: Secondary | ICD-10-CM | POA: Diagnosis not present

## 2015-12-24 DIAGNOSIS — R079 Chest pain, unspecified: Secondary | ICD-10-CM | POA: Diagnosis not present

## 2015-12-24 DIAGNOSIS — I10 Essential (primary) hypertension: Secondary | ICD-10-CM | POA: Insufficient documentation

## 2015-12-24 DIAGNOSIS — I2510 Atherosclerotic heart disease of native coronary artery without angina pectoris: Secondary | ICD-10-CM | POA: Diagnosis not present

## 2015-12-24 DIAGNOSIS — Z8249 Family history of ischemic heart disease and other diseases of the circulatory system: Secondary | ICD-10-CM | POA: Diagnosis not present

## 2015-12-24 DIAGNOSIS — R9439 Abnormal result of other cardiovascular function study: Secondary | ICD-10-CM | POA: Insufficient documentation

## 2015-12-24 DIAGNOSIS — R42 Dizziness and giddiness: Secondary | ICD-10-CM | POA: Diagnosis not present

## 2015-12-24 LAB — MYOCARDIAL PERFUSION IMAGING
CSEPPHR: 93 {beats}/min
Rest HR: 73 {beats}/min
SDS: 0
SRS: 12
SSS: 12
TID: 0.97

## 2015-12-24 MED ORDER — TECHNETIUM TC 99M SESTAMIBI GENERIC - CARDIOLITE
11.0000 | Freq: Once | INTRAVENOUS | Status: AC | PRN
  Administered 2015-12-24: 11 via INTRAVENOUS

## 2015-12-24 MED ORDER — TECHNETIUM TC 99M SESTAMIBI GENERIC - CARDIOLITE
31.6000 | Freq: Once | INTRAVENOUS | Status: AC | PRN
  Administered 2015-12-24: 31.6 via INTRAVENOUS

## 2015-12-24 MED ORDER — REGADENOSON 0.4 MG/5ML IV SOLN
0.4000 mg | Freq: Once | INTRAVENOUS | Status: AC
  Administered 2015-12-24: 0.4 mg via INTRAVENOUS

## 2015-12-29 ENCOUNTER — Ambulatory Visit (HOSPITAL_COMMUNITY): Payer: Medicare Other | Attending: Internal Medicine

## 2015-12-29 DIAGNOSIS — E785 Hyperlipidemia, unspecified: Secondary | ICD-10-CM | POA: Insufficient documentation

## 2015-12-29 DIAGNOSIS — R079 Chest pain, unspecified: Secondary | ICD-10-CM | POA: Diagnosis not present

## 2015-12-29 DIAGNOSIS — R0600 Dyspnea, unspecified: Secondary | ICD-10-CM | POA: Diagnosis present

## 2015-12-29 DIAGNOSIS — I2510 Atherosclerotic heart disease of native coronary artery without angina pectoris: Secondary | ICD-10-CM | POA: Diagnosis not present

## 2015-12-29 DIAGNOSIS — I491 Atrial premature depolarization: Secondary | ICD-10-CM | POA: Insufficient documentation

## 2015-12-29 DIAGNOSIS — I119 Hypertensive heart disease without heart failure: Secondary | ICD-10-CM | POA: Diagnosis not present

## 2015-12-29 DIAGNOSIS — Z87891 Personal history of nicotine dependence: Secondary | ICD-10-CM | POA: Insufficient documentation

## 2015-12-29 DIAGNOSIS — I493 Ventricular premature depolarization: Secondary | ICD-10-CM | POA: Diagnosis not present

## 2015-12-29 DIAGNOSIS — R29898 Other symptoms and signs involving the musculoskeletal system: Secondary | ICD-10-CM | POA: Diagnosis not present

## 2015-12-29 DIAGNOSIS — R0602 Shortness of breath: Secondary | ICD-10-CM | POA: Diagnosis not present

## 2015-12-30 ENCOUNTER — Other Ambulatory Visit: Payer: Self-pay | Admitting: Internal Medicine

## 2015-12-30 DIAGNOSIS — Z79899 Other long term (current) drug therapy: Secondary | ICD-10-CM

## 2015-12-30 DIAGNOSIS — R0602 Shortness of breath: Secondary | ICD-10-CM

## 2015-12-30 MED ORDER — FUROSEMIDE 40 MG PO TABS: 40.0000 mg | ORAL_TABLET | Freq: Every day | ORAL | Status: DC

## 2015-12-30 MED ORDER — METOPROLOL SUCCINATE ER 25 MG PO TB24: 12.5000 mg | ORAL_TABLET | Freq: Every day | ORAL | Status: DC

## 2016-01-06 ENCOUNTER — Encounter: Payer: Self-pay | Admitting: Internal Medicine

## 2016-01-06 ENCOUNTER — Ambulatory Visit (INDEPENDENT_AMBULATORY_CARE_PROVIDER_SITE_OTHER): Payer: Medicare Other | Admitting: Internal Medicine

## 2016-01-06 VITALS — BP 144/68 | HR 62 | Ht 67.0 in | Wt 132.6 lb

## 2016-01-06 DIAGNOSIS — R5383 Other fatigue: Secondary | ICD-10-CM

## 2016-01-06 DIAGNOSIS — I519 Heart disease, unspecified: Secondary | ICD-10-CM

## 2016-01-06 DIAGNOSIS — I255 Ischemic cardiomyopathy: Secondary | ICD-10-CM | POA: Insufficient documentation

## 2016-01-06 DIAGNOSIS — I252 Old myocardial infarction: Secondary | ICD-10-CM

## 2016-01-06 DIAGNOSIS — Z01818 Encounter for other preprocedural examination: Secondary | ICD-10-CM

## 2016-01-06 DIAGNOSIS — D689 Coagulation defect, unspecified: Secondary | ICD-10-CM

## 2016-01-06 DIAGNOSIS — R0602 Shortness of breath: Secondary | ICD-10-CM

## 2016-01-06 DIAGNOSIS — I5021 Acute systolic (congestive) heart failure: Secondary | ICD-10-CM

## 2016-01-06 DIAGNOSIS — I493 Ventricular premature depolarization: Secondary | ICD-10-CM

## 2016-01-06 LAB — BASIC METABOLIC PANEL
BUN: 20 mg/dL (ref 7–25)
CALCIUM: 9.5 mg/dL (ref 8.6–10.3)
CO2: 28 mmol/L (ref 20–31)
CREATININE: 1.17 mg/dL (ref 0.70–1.18)
Chloride: 104 mmol/L (ref 98–110)
GLUCOSE: 73 mg/dL (ref 65–99)
Potassium: 4 mmol/L (ref 3.5–5.3)
SODIUM: 142 mmol/L (ref 135–146)

## 2016-01-06 LAB — BRAIN NATRIURETIC PEPTIDE: BRAIN NATRIURETIC PEPTIDE: 26.5 pg/mL (ref <100)

## 2016-01-06 NOTE — Progress Notes (Signed)
OFFICE NOTE  Chief Complaint:  Follow-up studies  Primary Care Physician: Odette Fraction, MD  HPI:  Bruce Travis is a 81 y.o. male his past medical history significant for hypertension, dyslipidemia (he was on an 54 month study with Lipitor in the 2000's), coronary artery disease which was not significant by report of cardiac catheterization in the early 2000's with Dr. Eustace Quail. He also has significant COPD which is oxygen dependent. Recently he was noted to have EKG changes including PACs and PVCs which were demonstrated on his EKG today. The rhythm appears to be sinus. He is not aware of these as far as being symptomatic. He does report shortness of breath which she says is been fairly stable and is improved on oxygen. He has infrequent chest pains, which are not always associated with exertion, but has numerous cardiac risk factors including a significant smoking history in the past although quit more than 20 years ago.  01/06/2016  Bruce Travis returns today for follow-up. He underwent an echocardiogram and nuclear stress test. The nuclear stress test was not gated due to frequent PACs and PVCs. This did demonstrate a fixed inferior defect concerning for scar. He also had an echocardiogram which shows a cardiomyopathy which is likely ischemic. EF is 20-25% with inferior akinesis and global hypokinesis. There is moderate RV dysfunction as well. I discussed the findings with Bruce Travis today as well as his wife and his son who is a physician with the [employer] in [place]. This was done by phone conference. My suggestion was that Bruce Travis undergo left and right heart catheterization to determine the etiology of his depressed LV function and inferior infarct. He may have multivessel coronary disease. It would also be beneficial to determine if he has low cardiac output. Blood work indicated a fairly low BNP and he's had minimal improvement with the addition of Lasix. He does have  significant COPD on oxygen. We discussed the risks, benefits and alternatives to cardiac catheterization late today in the office and he is agreeable to the procedure.  PMHx:  Past Medical History  Diagnosis Date  . Personal history of colonic polyps 04/26/2007    hyperplastic   . Diverticulosis of colon (without mention of hemorrhage)   . Family hx of colon cancer   . COPD (chronic obstructive pulmonary disease) ([place])   . Esophageal reflux   . Hypertension   . Hyperlipemia   . CAD (coronary artery disease)   . Throat cancer ([place])   . Other diseases of lung, not elsewhere classified   . Unspecified asthma(493.90)   . Nephrolithiasis   . Allergic rhinitis   . Bronchiectasis   . Celiac disease     Past Surgical History  Procedure Laterality Date  . Vasectomy    . Head and neck surgery    . 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 Hemorrhoid surgery    . Hand surgery      d/t crush injury, right  . Tonsillectomy and adenoidectomy      FAMHx:  Family History  Problem Relation Age of Onset  . Heart disease Father   . Colon cancer Father   . Prostate cancer Father   . Stroke Mother   . Endometrial cancer Sister   . Stroke Maternal Grandmother   . Cancer Paternal Grandmother     SOCHx:   reports that he quit smoking about 26 years ago. His smoking use included Cigarettes. He  has a 70 pack-year smoking history. He quit smokeless tobacco use about 14 years ago. His smokeless tobacco use included Chew. He reports that he does not drink alcohol or use illicit drugs.  ALLERGIES:  Allergies  Allergen Reactions  . Codeine Other (See Comments)    Thought head was going to blow off  . Gluten Meal Other (See Comments)    Celiac disease    ROS: Pertinent items noted in HPI and remainder of comprehensive ROS otherwise negative.  HOME MEDS: Current Outpatient Prescriptions  Medication Sig Dispense Refill  . AMBULATORY NON FORMULARY MEDICATION O2 2 Lpm continuous    .  aspirin 81 MG tablet Take 81 mg by mouth daily.     Marland Kitchen azelastine (ASTELIN) 0.1 % nasal spray Place 2 sprays into both nostrils 2 (two) times daily. Use in each nostril as directed    . furosemide (LASIX) 40 MG tablet Take 1 tablet (40 mg total) by mouth daily. 30 tablet 3  . metoprolol succinate (TOPROL XL) 25 MG 24 hr tablet Take 0.5 tablets (12.5 mg total) by mouth daily. 15 tablet 3  . montelukast (SINGULAIR) 10 MG tablet TAKE 1 TABLET BY MOUTH DAILY. 90 tablet 3  . pantoprazole (PROTONIX) 40 MG tablet Take 1 tablet (40 mg total) by mouth daily. 30 tablet 11  . PROAIR HFA 108 (90 BASE) MCG/ACT inhaler INHALE 2 PUFFS EVERY 4 HOURS AS NEEDED FOR SHORTNESS OF BREATH. 8.5 g 5  . SPIRIVA HANDIHALER 18 MCG inhalation capsule INHALE 1 CAPSULE ONCE DAILY AS DIRECTED 30 capsule 11  . SYMBICORT 160-4.5 MCG/ACT inhaler INHALE 2 PUFFS FIRST THING IN MORNING AND THEN ANOTHER 2 PUFFS ABOUT 12 HOURS LATER. 10.2 g 11  . valsartan (DIOVAN) 80 MG tablet TAKE ONE TABLET BY MOUTH DAILY. 90 tablet 3   No current facility-administered medications for this visit.    LABS/IMAGING: Results for orders placed or performed in visit on 12/30/15 (from the past 48 hour(s))  Basic metabolic panel     Status: None   Collection Time: 01/05/16 10:12 AM  Result Value Ref Range   Sodium 142 135 - 146 mmol/L   Potassium 4.0 3.5 - 5.3 mmol/L   Chloride 104 98 - 110 mmol/L   CO2 28 20 - 31 mmol/L   Glucose, Bld 73 65 - 99 mg/dL   BUN 20 7 - 25 mg/dL   Creat 1.17 0.70 - 1.18 mg/dL   Calcium 9.5 8.6 - 10.3 mg/dL  B Nat Peptide     Status: None   Collection Time: 01/05/16 10:14 AM  Result Value Ref Range   Brain Natriuretic Peptide 26.5 <100 pg/mL    Comment:   BNP levels increase with age in the general population with the highest values seen in individuals greater than 7 years of age. Reference: Joellyn Rued Cardiol 2002; 95:284-13.      No results found.  WEIGHTS: Wt Readings from Last 3 Encounters:  01/06/16 132  lb 9.6 oz (60.147 kg)  12/24/15 133 lb (60.328 kg)  12/12/15 133 lb (60.328 kg)    VITALS: BP 144/68 mmHg  Pulse 62  Ht 5' 7"  (1.702 m)  Wt 132 lb 9.6 oz (60.147 kg)  BMI 20.76 kg/m2  EXAM: Deferred  EKG: Deferred  ASSESSMENT: 1. Abnormal EKG with new PACs and PVCs 2. Dyspnea on exertion - severe global hypokinesis and akinesis inferiorly with EF 20-25% 3. Oxygen dependent COPD 4. Hypertension 5. History of dyslipidemia 6. History of mild coronary disease  by cath in the early 2000's - low risk Myoview (non-gated) with fixed inferior scar  PLAN: 1.   Bruce Travis has a significant new cardiomyopathy with inferior akinesis and global hypokinesis and EF of 20-25%. I'm recommending right and left heart catheterization. I discussed the risks and benefits as well as alternatives to this procedure today with him, his wife and son who is a physician via teleconference. They are all in agreement with proceeding. We will plan to schedule heart catheterization within the next couple of weeks. He should continue current medications including his current dose of Lasix. BNP was less than 50. Renal function appears stable. He is on aspirin, metoprolol and valsartan. He may benefit from the addition of spironolactone and/or change in valsartan over to [place], once we've had a chance to evaluate his coronary arteries.  Time spent directly with the patient and family: 45 minutes  Pixie Casino, MD, [facility] Attending Cardiologist [place] 01/06/2016, 5:56 PM

## 2016-01-06 NOTE — Patient Instructions (Signed)
Your physician has requested that you have a cardiac catheterization @ [facility]. Cardiac catheterization is used to diagnose and/or treat various heart conditions. Doctors may recommend this procedure for a number of different reasons. The most common reason is to evaluate chest pain. Chest pain can be a symptom of coronary artery disease (CAD), and cardiac catheterization can show whether plaque is narrowing or blocking your heart's arteries. This procedure is also used to evaluate the valves, as well as measure the blood flow and oxygen levels in different parts of your heart. For further information please visit [email or web address]. Please follow instruction sheet, as given.  Following your catheterization, you will not be allowed to drive for 3 days.  No lifting, pushing, or pulling greater that 10 pounds is allowed for 1 week.  You will be required to have the following tests prior to the procedure:  1. Blood work - the blood work can be done no more than 14 days prior to the procedure.  It can be done at any [facility] lab. There is a lab downstairs on the first floor of this building in [address] and one at [address].  2. Chest X-ray - this can be done at [facility] in the [place] @ [address]

## 2016-01-07 ENCOUNTER — Telehealth: Payer: Self-pay | Admitting: Internal Medicine

## 2016-01-07 ENCOUNTER — Encounter: Payer: Self-pay | Admitting: *Deleted

## 2016-01-07 NOTE — Telephone Encounter (Signed)
Returned call to patient. He is doing better than he was this AM. He voiced understanding of need to go to ED for evaluation if chest pain persists, recurs, worsens. He had previously planned a drive to [place] with wife to see his granddaughter graduate this weekend, but has decided against that due to his health.

## 2016-01-07 NOTE — Telephone Encounter (Signed)
Pt called in stating he had chest pressure that started at 5 AM this morning that the pain radiated down his left arm to his elbow. Pt stated that his SOB remained at baseline and he was more "uncomfortable than in pain". Pt stated that most times he has this pressure with exertion not at rest. Pt stated that the "discomfort" lasted until about 9:30am and has gone away at this time. Pt stated that after he got up and moving around is when the pressure went away. Pt stated he has increased his home oxygen to 3 liters (okay per PCP) and will decrease to 2 liters once he feels little better. Pt stated he just wanted Dr. Debara Pickett to know. Pt told that if pain/pressure returns and does not go away after resting awhile to go to the ED to be evaluated and that I will forward message to Dr. Debara Pickett to review and we will call him back if he has additional suggestions. Pt verbalized understanding, no additional questions at this time.

## 2016-01-07 NOTE — Telephone Encounter (Signed)
I agree .Bruce Travis His labs look good. If he has any more recurrent chest pain, should go to the ER and will likely get a heart cath sooner than later.  Dr. Lemmie Evens

## 2016-01-07 NOTE — Telephone Encounter (Signed)
Pt is calling in wanting to speak with [place] about last night. He says that he had more chest pressure and some pain in his left arm. Please f/u with pt.Bruce Travis

## 2016-01-15 ENCOUNTER — Other Ambulatory Visit: Payer: Self-pay | Admitting: Family Medicine

## 2016-01-15 NOTE — Telephone Encounter (Signed)
Refill appropriate and filled per protocol. 

## 2016-01-28 ENCOUNTER — Ambulatory Visit
Admission: RE | Admit: 2016-01-28 | Discharge: 2016-01-28 | Disposition: A | Discharge status: Home / Self Care | Payer: Medicare Other | Source: Ambulatory Visit | Attending: Internal Medicine | Admitting: Internal Medicine

## 2016-01-28 DIAGNOSIS — Z01818 Encounter for other preprocedural examination: Secondary | ICD-10-CM

## 2016-01-28 LAB — CBC
HEMATOCRIT: 49 % (ref 38.5–50.0)
Hemoglobin: 16.8 g/dL (ref 13.2–17.1)
MCH: 33.7 pg — AB (ref 27.0–33.0)
MCHC: 34.3 g/dL (ref 32.0–36.0)
MCV: 98.2 fL (ref 80.0–100.0)
MPV: 10.6 fL (ref 7.5–12.5)
PLATELETS: 235 10*3/uL (ref 140–400)
RBC: 4.99 MIL/uL (ref 4.20–5.80)
RDW: 14 % (ref 11.0–15.0)
WBC: 10.7 10*3/uL (ref 3.8–10.8)

## 2016-01-29 LAB — TSH: TSH: 2.16 mIU/L (ref 0.40–4.50)

## 2016-01-29 LAB — BASIC METABOLIC PANEL
BUN: 22 mg/dL (ref 7–25)
CALCIUM: 9.3 mg/dL (ref 8.6–10.3)
CHLORIDE: 105 mmol/L (ref 98–110)
CO2: 27 mmol/L (ref 20–31)
CREATININE: 1.31 mg/dL — AB (ref 0.70–1.18)
Glucose, Bld: 69 mg/dL (ref 65–99)
Potassium: 4.5 mmol/L (ref 3.5–5.3)
Sodium: 141 mmol/L (ref 135–146)

## 2016-01-29 LAB — APTT: aPTT: 28 seconds (ref 24–37)

## 2016-01-29 LAB — PROTIME-INR
INR: 1.05 (ref <1.50)
PROTHROMBIN TIME: 13.8 s (ref 11.6–15.2)

## 2016-02-03 ENCOUNTER — Inpatient Hospital Stay (HOSPITAL_COMMUNITY)
Admission: AD | Admit: 2016-02-03 | Discharge: 2016-02-06 | DRG: 286 | Disposition: A | Discharge status: Home / Self Care | Payer: Medicare Other | Source: Ambulatory Visit | Attending: Internal Medicine | Admitting: Internal Medicine

## 2016-02-03 ENCOUNTER — Encounter (HOSPITAL_COMMUNITY)
Admission: AD | Disposition: A | Discharge status: Home / Self Care | Payer: Self-pay | Source: Ambulatory Visit | Attending: Internal Medicine

## 2016-02-03 ENCOUNTER — Telehealth: Payer: Self-pay | Admitting: Internal Medicine

## 2016-02-03 ENCOUNTER — Encounter (HOSPITAL_COMMUNITY): Payer: Self-pay | Admitting: General Practice

## 2016-02-03 DIAGNOSIS — Z9981 Dependence on supplemental oxygen: Secondary | ICD-10-CM | POA: Diagnosis not present

## 2016-02-03 DIAGNOSIS — I110 Hypertensive heart disease with heart failure: Principal | ICD-10-CM | POA: Diagnosis present

## 2016-02-03 DIAGNOSIS — I5023 Acute on chronic systolic (congestive) heart failure: Secondary | ICD-10-CM | POA: Diagnosis present

## 2016-02-03 DIAGNOSIS — R570 Cardiogenic shock: Secondary | ICD-10-CM | POA: Diagnosis present

## 2016-02-03 DIAGNOSIS — I10 Essential (primary) hypertension: Secondary | ICD-10-CM | POA: Diagnosis present

## 2016-02-03 DIAGNOSIS — R0602 Shortness of breath: Secondary | ICD-10-CM

## 2016-02-03 DIAGNOSIS — I509 Heart failure, unspecified: Secondary | ICD-10-CM

## 2016-02-03 DIAGNOSIS — I252 Old myocardial infarction: Secondary | ICD-10-CM

## 2016-02-03 DIAGNOSIS — Z85819 Personal history of malignant neoplasm of unspecified site of lip, oral cavity, and pharynx: Secondary | ICD-10-CM | POA: Diagnosis not present

## 2016-02-03 DIAGNOSIS — I519 Heart disease, unspecified: Secondary | ICD-10-CM

## 2016-02-03 DIAGNOSIS — Z7982 Long term (current) use of aspirin: Secondary | ICD-10-CM

## 2016-02-03 DIAGNOSIS — K900 Celiac disease: Secondary | ICD-10-CM | POA: Diagnosis present

## 2016-02-03 DIAGNOSIS — I429 Cardiomyopathy, unspecified: Secondary | ICD-10-CM | POA: Diagnosis not present

## 2016-02-03 DIAGNOSIS — I493 Ventricular premature depolarization: Secondary | ICD-10-CM | POA: Diagnosis not present

## 2016-02-03 DIAGNOSIS — Z87891 Personal history of nicotine dependence: Secondary | ICD-10-CM

## 2016-02-03 DIAGNOSIS — K219 Gastro-esophageal reflux disease without esophagitis: Secondary | ICD-10-CM | POA: Diagnosis present

## 2016-02-03 DIAGNOSIS — E785 Hyperlipidemia, unspecified: Secondary | ICD-10-CM | POA: Diagnosis present

## 2016-02-03 DIAGNOSIS — I2510 Atherosclerotic heart disease of native coronary artery without angina pectoris: Secondary | ICD-10-CM | POA: Diagnosis present

## 2016-02-03 DIAGNOSIS — I5021 Acute systolic (congestive) heart failure: Secondary | ICD-10-CM | POA: Diagnosis present

## 2016-02-03 DIAGNOSIS — J449 Chronic obstructive pulmonary disease, unspecified: Secondary | ICD-10-CM | POA: Diagnosis present

## 2016-02-03 DIAGNOSIS — I255 Ischemic cardiomyopathy: Secondary | ICD-10-CM | POA: Diagnosis present

## 2016-02-03 HISTORY — PX: CARDIAC CATHETERIZATION: SHX172

## 2016-02-03 HISTORY — DX: Chronic kidney disease, stage 3 (moderate): N18.3

## 2016-02-03 HISTORY — DX: Family history of other specified conditions: Z84.89

## 2016-02-03 HISTORY — DX: Pneumonia, unspecified organism: J18.9

## 2016-02-03 HISTORY — DX: Dependence on supplemental oxygen: Z99.81

## 2016-02-03 HISTORY — PX: ULTRASOUND GUIDANCE FOR VASCULAR ACCESS: SHX6516

## 2016-02-03 HISTORY — DX: Malignant neoplasm of larynx, unspecified: C32.9

## 2016-02-03 HISTORY — DX: Heart failure, unspecified: I50.9

## 2016-02-03 HISTORY — DX: Personal history of other diseases of the digestive system: Z87.19

## 2016-02-03 HISTORY — DX: Personal history of peptic ulcer disease: Z87.11

## 2016-02-03 HISTORY — DX: Acute myocardial infarction, unspecified: I21.9

## 2016-02-03 HISTORY — DX: Chronic kidney disease, stage 3 unspecified: N18.30

## 2016-02-03 LAB — CBC
HEMATOCRIT: 46.8 % (ref 39.0–52.0)
HEMOGLOBIN: 15.6 g/dL (ref 13.0–17.0)
MCH: 32.5 pg (ref 26.0–34.0)
MCHC: 33.3 g/dL (ref 30.0–36.0)
MCV: 97.5 fL (ref 78.0–100.0)
Platelets: 174 10*3/uL (ref 150–400)
RBC: 4.8 MIL/uL (ref 4.22–5.81)
RDW: 13.7 % (ref 11.5–15.5)
WBC: 7.7 10*3/uL (ref 4.0–10.5)

## 2016-02-03 LAB — POCT I-STAT 3, ART BLOOD GAS (G3+)
Acid-base deficit: 3 mmol/L — ABNORMAL HIGH (ref 0.0–2.0)
Bicarbonate: 22.1 mEq/L (ref 20.0–24.0)
O2 Saturation: 91 %
PCO2 ART: 37.8 mmHg (ref 35.0–45.0)
PH ART: 7.375 (ref 7.350–7.450)
TCO2: 23 mmol/L (ref 0–100)
pO2, Arterial: 62 mmHg — ABNORMAL LOW (ref 80.0–100.0)

## 2016-02-03 LAB — CREATININE, SERUM
Creatinine, Ser: 1.09 mg/dL (ref 0.61–1.24)
GFR calc Af Amer: >60 mL/min (ref >60)
GFR calc non Af Amer: >60 mL/min (ref >60)

## 2016-02-03 LAB — POCT I-STAT 3, VENOUS BLOOD GAS (G3P V)
ACID-BASE DEFICIT: 1 mmol/L (ref 0.0–2.0)
Bicarbonate: 24.3 mEq/L — ABNORMAL HIGH (ref 20.0–24.0)
O2 SAT: 52 %
PH VEN: 7.382 — AB (ref 7.250–7.300)
TCO2: 26 mmol/L (ref 0–100)
pCO2, Ven: 40.9 mmHg — ABNORMAL LOW (ref 45.0–50.0)
pO2, Ven: 28 mmHg — ABNORMAL LOW (ref 31.0–45.0)

## 2016-02-03 SURGERY — ULTRASOUND GUIDANCE, FOR VASCULAR ACCESS

## 2016-02-03 MED ORDER — HEPARIN (PORCINE) IN NACL 2-0.9 UNIT/ML-% IJ SOLN
INTRAMUSCULAR | Status: DC | PRN
  Administered 2016-02-03: 1000 mL

## 2016-02-03 MED ORDER — ASPIRIN 81 MG PO CHEW
CHEWABLE_TABLET | ORAL | Status: AC
  Filled 2016-02-03: qty 1

## 2016-02-03 MED ORDER — SODIUM CHLORIDE 0.9 % IV SOLN: 250.0000 mL | INTRAVENOUS | Status: DC | PRN

## 2016-02-03 MED ORDER — FENTANYL CITRATE (PF) 100 MCG/2ML IJ SOLN
INTRAMUSCULAR | Status: AC
  Filled 2016-02-03: qty 2

## 2016-02-03 MED ORDER — FUROSEMIDE 40 MG PO TABS
40.0000 mg | ORAL_TABLET | Freq: Every day | ORAL | Status: DC
  Filled 2016-02-03: qty 1

## 2016-02-03 MED ORDER — SODIUM CHLORIDE 0.9% FLUSH: 3.0000 mL | INTRAVENOUS | Status: DC | PRN

## 2016-02-03 MED ORDER — SODIUM CHLORIDE 0.9% FLUSH: 10.0000 mL | INTRAVENOUS | Status: DC | PRN

## 2016-02-03 MED ORDER — IRBESARTAN 75 MG PO TABS
75.0000 mg | ORAL_TABLET | Freq: Every day | ORAL | Status: DC
  Filled 2016-02-03: qty 1

## 2016-02-03 MED ORDER — ADULT MULTIVITAMIN W/MINERALS CH
1.0000 | ORAL_TABLET | Freq: Every day | ORAL | Status: DC
  Administered 2016-02-04 – 2016-02-05 (×2): 1 via ORAL
  Filled 2016-02-03 (×2): qty 1

## 2016-02-03 MED ORDER — SODIUM CHLORIDE 0.9 % IV SOLN
INTRAVENOUS | Status: DC
  Administered 2016-02-03: 09:00:00 via INTRAVENOUS

## 2016-02-03 MED ORDER — MIDAZOLAM HCL 2 MG/2ML IJ SOLN
INTRAMUSCULAR | Status: AC
  Filled 2016-02-03: qty 2

## 2016-02-03 MED ORDER — ONDANSETRON HCL 4 MG/2ML IJ SOLN: 4.0000 mg | Freq: Four times a day (QID) | INTRAMUSCULAR | Status: DC | PRN

## 2016-02-03 MED ORDER — HEPARIN SODIUM (PORCINE) 5000 UNIT/ML IJ SOLN
5000.0000 [IU] | Freq: Three times a day (TID) | INTRAMUSCULAR | Status: DC
Start: 2016-02-03 — End: 2016-02-06

## 2016-02-03 MED ORDER — MILRINONE LACTATE IN DEXTROSE 20-5 MG/100ML-% IV SOLN
0.2500 ug/kg/min | INTRAVENOUS | Status: DC
  Administered 2016-02-03: 0.25 ug/kg/min via INTRAVENOUS
  Filled 2016-02-03 (×2): qty 100

## 2016-02-03 MED ORDER — HEPARIN SODIUM (PORCINE) 1000 UNIT/ML IJ SOLN
INTRAMUSCULAR | Status: AC
  Filled 2016-02-03: qty 1

## 2016-02-03 MED ORDER — ACETAMINOPHEN 325 MG PO TABS
650.0000 mg | ORAL_TABLET | ORAL | Status: DC | PRN
  Administered 2016-02-03 – 2016-02-04 (×2): 650 mg via ORAL
  Filled 2016-02-03 (×2): qty 2

## 2016-02-03 MED ORDER — SODIUM CHLORIDE 0.9% FLUSH
3.0000 mL | Freq: Two times a day (BID) | INTRAVENOUS | Status: DC
  Administered 2016-02-04 – 2016-02-05 (×2): 3 mL via INTRAVENOUS

## 2016-02-03 MED ORDER — IPRATROPIUM-ALBUTEROL 0.5-2.5 (3) MG/3ML IN SOLN: 3.0000 mL | RESPIRATORY_TRACT | Status: DC | PRN

## 2016-02-03 MED ORDER — ASPIRIN 81 MG PO CHEW
81.0000 mg | CHEWABLE_TABLET | ORAL | Status: AC
  Administered 2016-02-03: 81 mg via ORAL

## 2016-02-03 MED ORDER — MONTELUKAST SODIUM 10 MG PO TABS
10.0000 mg | ORAL_TABLET | Freq: Every day | ORAL | Status: DC
  Administered 2016-02-03 – 2016-02-05 (×3): 10 mg via ORAL
  Filled 2016-02-03 (×3): qty 1

## 2016-02-03 MED ORDER — MIDAZOLAM HCL 2 MG/2ML IJ SOLN
INTRAMUSCULAR | Status: DC | PRN
  Administered 2016-02-03: 1 mg via INTRAVENOUS

## 2016-02-03 MED ORDER — IOPAMIDOL (ISOVUE-370) INJECTION 76%
INTRAVENOUS | Status: DC | PRN
  Administered 2016-02-03: 53 mL via INTRA_ARTERIAL

## 2016-02-03 MED ORDER — AMIODARONE LOAD VIA INFUSION
150.0000 mg | Freq: Once | INTRAVENOUS | Status: AC
  Administered 2016-02-03: 150 mg via INTRAVENOUS
  Filled 2016-02-03: qty 83.34

## 2016-02-03 MED ORDER — PANTOPRAZOLE SODIUM 40 MG PO TBEC
40.0000 mg | DELAYED_RELEASE_TABLET | Freq: Every day | ORAL | Status: DC
  Administered 2016-02-03 – 2016-02-05 (×3): 40 mg via ORAL
  Filled 2016-02-03 (×3): qty 1

## 2016-02-03 MED ORDER — TIOTROPIUM BROMIDE MONOHYDRATE 18 MCG IN CAPS
18.0000 ug | ORAL_CAPSULE | Freq: Every day | RESPIRATORY_TRACT | Status: DC
  Administered 2016-02-04 – 2016-02-06 (×3): 18 ug via RESPIRATORY_TRACT
  Filled 2016-02-03: qty 5

## 2016-02-03 MED ORDER — VERAPAMIL HCL 2.5 MG/ML IV SOLN
INTRAVENOUS | Status: AC
  Filled 2016-02-03: qty 2

## 2016-02-03 MED ORDER — LIDOCAINE HCL (PF) 1 % IJ SOLN
INTRAMUSCULAR | Status: AC
  Filled 2016-02-03: qty 30

## 2016-02-03 MED ORDER — SODIUM CHLORIDE 0.9% FLUSH
3.0000 mL | Freq: Two times a day (BID) | INTRAVENOUS | Status: DC
  Administered 2016-02-04 – 2016-02-05 (×4): 3 mL via INTRAVENOUS

## 2016-02-03 MED ORDER — ASPIRIN EC 81 MG PO TBEC
81.0000 mg | DELAYED_RELEASE_TABLET | Freq: Every day | ORAL | Status: DC
  Administered 2016-02-04 – 2016-02-05 (×2): 81 mg via ORAL
  Filled 2016-02-03 (×3): qty 1

## 2016-02-03 MED ORDER — HEPARIN (PORCINE) IN NACL 2-0.9 UNIT/ML-% IJ SOLN
INTRAMUSCULAR | Status: AC
  Filled 2016-02-03: qty 1000

## 2016-02-03 MED ORDER — IOPAMIDOL (ISOVUE-370) INJECTION 76%
INTRAVENOUS | Status: AC
  Filled 2016-02-03: qty 100

## 2016-02-03 MED ORDER — LIDOCAINE HCL (PF) 1 % IJ SOLN
INTRAMUSCULAR | Status: DC | PRN
Start: 2016-02-03 — End: 2016-02-03
  Administered 2016-02-03: 2 mL via INTRADERMAL
  Administered 2016-02-03: 13 mL via INTRADERMAL
  Administered 2016-02-03: 2 mL via INTRADERMAL

## 2016-02-03 MED ORDER — AZELASTINE HCL 0.1 % NA SOLN: 2.0000 | Freq: Two times a day (BID) | NASAL | Status: DC | PRN

## 2016-02-03 MED ORDER — SODIUM CHLORIDE 0.9% FLUSH
3.0000 mL | Freq: Two times a day (BID) | INTRAVENOUS | Status: DC
  Administered 2016-02-05: 3 mL via INTRAVENOUS

## 2016-02-03 MED ORDER — FENTANYL CITRATE (PF) 100 MCG/2ML IJ SOLN
INTRAMUSCULAR | Status: DC | PRN
  Administered 2016-02-03 (×2): 25 ug via INTRAVENOUS

## 2016-02-03 MED ORDER — MOMETASONE FURO-FORMOTEROL FUM 200-5 MCG/ACT IN AERO
2.0000 | INHALATION_SPRAY | Freq: Two times a day (BID) | RESPIRATORY_TRACT | Status: DC
  Administered 2016-02-04 – 2016-02-06 (×5): 2 via RESPIRATORY_TRACT
  Filled 2016-02-03: qty 8.8

## 2016-02-03 MED ORDER — AMIODARONE HCL IN DEXTROSE 360-4.14 MG/200ML-% IV SOLN
60.0000 mg/h | INTRAVENOUS | Status: AC
  Administered 2016-02-03: 60 mg/h via INTRAVENOUS
  Filled 2016-02-03: qty 200

## 2016-02-03 MED ORDER — AMIODARONE HCL IN DEXTROSE 360-4.14 MG/200ML-% IV SOLN
30.0000 mg/h | INTRAVENOUS | Status: DC
  Administered 2016-02-04 – 2016-02-06 (×4): 30 mg/h via INTRAVENOUS
  Filled 2016-02-03 (×7): qty 200

## 2016-02-03 SURGICAL SUPPLY — 14 items
CATH BALLN WEDGE 5F 110CM (CATHETERS) ×2 IMPLANT
CATH INFINITI 5FR MULTPACK ANG (CATHETERS) ×2 IMPLANT
COVER PRB 48X5XTLSCP FOLD TPE (BAG) IMPLANT
COVER PROBE 5X48 (BAG) ×4
GLIDESHEATH SLEND SS 6F .021 (SHEATH) ×2 IMPLANT
GUIDEWIRE .025 260CM (WIRE) ×2 IMPLANT
KIT HEART LEFT (KITS) ×4 IMPLANT
PACK CARDIAC CATHETERIZATION (CUSTOM PROCEDURE TRAY) ×4 IMPLANT
SHEATH FAST CATH BRACH 5F 5CM (SHEATH) ×2 IMPLANT
SHEATH PINNACLE 5F 10CM (SHEATH) ×2 IMPLANT
SYR MEDRAD MARK V 150ML (SYRINGE) ×4 IMPLANT
TRANSDUCER W/STOPCOCK (MISCELLANEOUS) ×4 IMPLANT
TUBING CIL FLEX 10 FLL-RA (TUBING) ×4 IMPLANT
WIRE EMERALD 3MM-J .035X150CM (WIRE) ×2 IMPLANT

## 2016-02-03 NOTE — Progress Notes (Signed)
Brachial venous sheath removed and 10 minutes of manual pressure held. Dressing applied and patient education given. Pt verbalized understanding. VSS.   Will continue to monitor.   Earlie Lou

## 2016-02-03 NOTE — Progress Notes (Signed)
Report called to RN for 3-w-20

## 2016-02-03 NOTE — Progress Notes (Signed)
Transferred to 3-w-20 via bed with O2

## 2016-02-03 NOTE — Progress Notes (Signed)
Pharmacy Consult for Milrinone (Primacor) Initiation  Indication:   Acute Decompensated Heart Failure with volume overload and low cardiac output  Allergies  Allergen Reactions  . Adhesive [Tape] Other (See Comments)    Band-aids - tears skin off  . Codeine Other (See Comments)    Thought head was going to blow off  . Gluten Meal Other (See Comments)    Celiac disease    Temp:  [97.5 F (36.4 C)-97.8 F (36.6 C)] 97.8 F (36.6 C) (06/13 1400) Pulse Rate:  [0-97] 68 (06/13 1600) Cardiac Rhythm:  [-]  Resp:  [0-34] 15 (06/13 1600) BP: (105-156)/(58-95) 152/85 mmHg (06/13 1600) SpO2:  [0 %-97 %] 95 % (06/13 1600) Weight:  [130 lb (58.968 kg)] 130 lb (58.968 kg) (06/13 0845)  LABS    Component Value Date/Time   NA 141 01/28/2016 1435   K 4.5 01/28/2016 1435   CL 105 01/28/2016 1435   CO2 27 01/28/2016 1435   GLUCOSE 69 01/28/2016 1435   BUN 22 01/28/2016 1435   CREATININE 1.31* 01/28/2016 1435   CREATININE 0.96 07/06/2014 0120   CALCIUM 9.3 01/28/2016 1435   GFRNONAA 71 09/20/2014 0803   GFRNONAA 78* 07/06/2014 0120   GFRAA 82 09/20/2014 0803   GFRAA >90 07/06/2014 0120   Last magnesium:  Lab Results  Component Value Date   MG 2.0 07/05/2014   Estimated Creatinine Clearance: 38.2 mL/min (by C-G formula based on Cr of 1.31). CREATININE: 1.31 mg/dL ABNORMAL (01/28/16 1435) Estimated creatinine clearance - 38.2 mL/min estimated creatinine clearance is 38.2 mL/min (by C-G formula based on Cr of 1.31).  No intake or output data in the 24 hours ending 02/03/16 1612  Filed Weights   02/03/16 0845  Weight: 130 lb (58.968 kg)    Assessment:  81 y.o. male admitted 02/03/2016 with acute decompensated congestive heart failure (EF= < 25%) to be initiated on milrinone.  Milrinone can cause arrhythmias.  Monitor patient for ECG changes.  Plan is to initiate milrinone for inotropic support. -s/p R heart cath: CO 2.49,CI 1.49 -K= 4.5 -SCr= 1.31 and CrCl ~ 40  Plan:  1.  Initiate milrinone based on renal function: (Consider starting dose of 0.125 - 0.25 for patients with SBP <176mHg) [employer] [facility] Calculated CrCl Dose  []  > 50 ml/min 0.375 mcg/kg/min  [x]  20-49 ml/min 0.250 mcg/kg/min  []  < 20 ml/min 0.125 mcg/kg/min   2. Nursing to monitor vital signs per milrinone protocol and physician parameters. 3. Pharmacy to follow peripherally, please reconsult if needed or there is further questions. 4.  Please contact MD for further dosing instructions.  Thank you for allowing [facility] be a part of this patient's care.  AHildred Laser Pharm D 02/03/2016 4:15 PM

## 2016-02-03 NOTE — Progress Notes (Signed)
Arterial femoral sheath pulled. Small hematoma developed and was fully resolved with manual pressure. Bruising around site. 20 minutes manual pressure applied. Dressing applied and patient education provided. Pt verbalized understanding. VSS.   Bruce Travis

## 2016-02-03 NOTE — H&P (Signed)
     INTERVAL PROCEDURE H&P  History and Physical Interval Note:  02/03/2016 9:13 AM  Bruce Travis has presented today for their planned procedure. The various methods of treatment have been discussed with the patient and family. After consideration of risks, benefits and other options for treatment, the patient has consented to the procedure.  The patients' outpatient history has been reviewed, patient examined, and no change in status from most recent office note within the past 30 days. I have reviewed the patients' chart and labs and will proceed as planned. Questions were answered to the patient's satisfaction.   Cath Lab Visit (complete for each Cath Lab visit)  Clinical Evaluation Leading to the Procedure:   ACS: No.  Non-ACS:    Anginal Classification: No Symptoms  Anti-ischemic medical therapy: Maximal Therapy (2 or more classes of medications)  Non-Invasive Test Results: No non-invasive testing performed  Prior CABG: No previous CABG  Bruce Casino, MD, [facility] Attending Cardiologist [place] 02/03/2016, 9:13 AM

## 2016-02-03 NOTE — H&P (Signed)
ADMISSION HISTORY & PHYSICAL   Chief Complaint:  Short of breath  Cardiologist: Dr. Debara Pickett  Primary Care Physician: Odette Fraction, MD  HPI:  This is a 81 y.o. male with a past medical history significant for hypertension, dyslipidemia (he was on an 62 month study with Lipitor in the 2000's), coronary artery disease which was not significant by report of cardiac catheterization in 2002 with Dr. Eustace Quail (30% proximal LAD disease). He also has significant COPD which is oxygen dependent on 2L [place]. Recently he was noted to have EKG changes including PACs and PVCs which were demonstrated on his EKG today. The rhythm appears to be sinus. He is not aware of these as far as being symptomatic. He does report shortness of breath which has worsened recently. He underwent an echocardiogram and nuclear stress test. The nuclear stress test was not gated due to frequent PACs and PVCs. This did demonstrate a fixed inferior defect concerning for scar (unchanged from nuclear report in 2002). He also had an echocardiogram which shows a cardiomyopathy. EF is 20-25% with inferior akinesis and global hypokinesis. There is moderate RV dysfunction as well. I discussed the findings with Bruce Travis today as well as his wife and his son who is a physician with the [employer] in [place]. This was done by phone conference. My suggestion was that Bruce Travis undergo left and right heart catheterization to determine the etiology of his depressed LV function and inferior infarct. He did undergo left and right heart catheterization today and the results demonstrated:   Mid LAD lesion, 25% stenosed.  There is severe left ventricular systolic dysfunction.  LVEF <25% with global hypokinesis  CO 2.49 / CI 1.49  Mild non-obstructive CAD. Non-ischemic cardiomyopathy with EF <25%, global hypokinesis. Compensated right and left heart pressures with severely reduced cardiac output and index.   Based on low  cardiac output and compensated left and right heart pressures, I discussed the case with Dr. Haroldine Laws. He recommended admission for inotrope therapy - hold B-blockers and he will evaluate for further advanced heart failure options.  PMHx:  Past Medical History  Diagnosis Date  . Personal history of colonic polyps 04/26/2007    hyperplastic   . Diverticulosis of colon (without mention of hemorrhage)   . Family hx of colon cancer   . COPD (chronic obstructive pulmonary disease) ([place])   . Esophageal reflux   . Hypertension   . Hyperlipemia   . CAD (coronary artery disease)   . Throat cancer ([place])   . Other diseases of lung, not elsewhere classified   . Unspecified asthma(493.90)   . Nephrolithiasis   . Allergic rhinitis   . Bronchiectasis   . Celiac disease     Past Surgical History  Procedure Laterality Date  . Vasectomy    . Head and neck surgery    . 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 Hemorrhoid surgery    . Hand surgery      d/t crush injury, right  . Tonsillectomy and adenoidectomy    . Cardiac catheterization  09/30/2009    non-obstructive CAD w/30% narrowing in prox LAD (Dr. Waunita Schooner)  . Cardiac catheterization  05/04/2001    same at 2011 cath (Dr. Waunita Schooner)    FAMHx:  Family History  Problem Relation Age of Onset  . Heart disease Father   . Colon cancer Father   . Prostate cancer Father   . Stroke Mother   .  Endometrial cancer Sister   . Stroke Maternal Grandmother   . Cancer Paternal Grandmother     SOCHx:   reports that he quit smoking about 27 years ago. His smoking use included Cigarettes. He has a 70 pack-year smoking history. He quit smokeless tobacco use about 14 years ago. His smokeless tobacco use included Chew. He reports that he does not drink alcohol or use illicit drugs.  ALLERGIES:  Allergies  Allergen Reactions  . Adhesive [Tape] Other (See Comments)    Band-aids - tears skin off  . Codeine Other (See Comments)    Thought head  was going to blow off  . Gluten Meal Other (See Comments)    Celiac disease    ROS: Pertinent items noted in HPI and remainder of comprehensive ROS otherwise negative.  HOME MEDS:   Medication List    ASK your doctor about these medications        AMBULATORY NON FORMULARY MEDICATION  O2 2 Lpm continuous     aspirin EC 81 MG tablet  Take 81 mg by mouth daily.     azelastine 0.1 % nasal spray  Commonly known as:  ASTELIN  Place 2 sprays into both nostrils 2 (two) times daily as needed for rhinitis or allergies.     furosemide 40 MG tablet  Commonly known as:  LASIX  Take 1 tablet (40 mg total) by mouth daily.     ipratropium-albuterol 0.5-2.5 (3) MG/3ML Soln  Commonly known as:  DUONEB  Take 3 mLs by nebulization every 4 (four) hours as needed (For shortness of breath or wheezing.).     metoprolol succinate 25 MG 24 hr tablet  Commonly known as:  TOPROL XL  Take 0.5 tablets (12.5 mg total) by mouth daily.     montelukast 10 MG tablet  Commonly known as:  SINGULAIR  TAKE 1 TABLET BY MOUTH DAILY.     multivitamin with minerals Tabs tablet  Take 1 tablet by mouth daily.     pantoprazole 40 MG tablet  Commonly known as:  PROTONIX  TAKE ONE TABLET BY MOUTH DAILY.     PROAIR HFA 108 (90 Base) MCG/ACT inhaler  Generic drug:  albuterol  INHALE 2 PUFFS EVERY 4 HOURS AS NEEDED FOR SHORTNESS OF BREATH.     SPIRIVA HANDIHALER 18 MCG inhalation capsule  Generic drug:  tiotropium  INHALE 1 CAPSULE ONCE DAILY AS DIRECTED     SYMBICORT 160-4.5 MCG/ACT inhaler  Generic drug:  budesonide-formoterol  INHALE 2 PUFFS FIRST THING IN MORNING AND THEN ANOTHER 2 PUFFS ABOUT 12 HOURS LATER.     valsartan 80 MG tablet  Commonly known as:  DIOVAN  TAKE ONE TABLET BY MOUTH DAILY.        LABS/IMAGING: No results found for this or any previous visit (from the past 48 hour(s)). No results found.  VITALS: Filed Vitals:   02/03/16 1400 02/03/16 1416  BP: 130/93 148/80  Pulse: 61  68  Temp: 97.8 F (36.6 C)   Resp:      EXAM: General appearance: alert and no distress Neck: no carotid bruit and no JVD Lungs: clear to auscultation bilaterally Heart: regular rate and rhythm, S1, S2 normal and systolic murmur: early systolic 2/6, blowing at apex Abdomen: soft, non-tender; bowel sounds normal; no masses,  no organomegaly Extremities: extremities normal, atraumatic, no cyanosis or edema Pulses: 2+ and symmetric Skin: Pale, cool, dry Neurologic: Grossly normal Psych: Pleasant  IMPRESSION: 1. Principal Problem: 2.   Acute systolic (congestive)  heart failure ([place]) 3. Active Problems: 4.   Hypertension 5.   PVC's (premature ventricular contractions) 6.   Cardiomyopathy, ischemic 7.   LV dysfunction 8.   Heart failure ([place]) 9.   PLAN: 1. Admit to stepdown. Place PICC line and will start IV milrinone therapy. Hold b-blocker. Dr. Haroldine Laws will assume care with the heart failure service - I appreciate their assistance. Discussed with the patient and family and they are in agreement with this.  Pixie Casino, MD, [facility] Attending Cardiologist Bruce Travis 02/03/2016, 2:48 PM

## 2016-02-03 NOTE — Progress Notes (Signed)
Peripherally Inserted Central Catheter/Midline Placement  The IV Nurse has discussed with the patient and/or persons authorized to consent for the patient, the purpose of this procedure and the potential benefits and risks involved with this procedure.  The benefits include less needle sticks, lab draws from the catheter and patient may be discharged home with the catheter.  Risks include, but not limited to, infection, bleeding, blood clot (thrombus formation), and puncture of an artery; nerve damage and irregular heat beat.  Alternatives to this procedure were also discussed.  PICC/Midline Placement Documentation        Bruce Travis 02/03/2016, 6:29 PM Consent obtained by Claretha Cooper, RN

## 2016-02-03 NOTE — Progress Notes (Signed)
   ADVANCED HF TEAM NOTE  Asked to see patient by Dr. Debara Pickett for severe NICM and cardiogenic shock.  Patient denies h/o known heart problems. Had cath in 2002 with normal coronaries and LV function. Echo 12/11 also normal EF.  Began to feel weak a few months ago and PCP noted frequent PAC & PVCs. Echo with EF 25%. Had + myoview (not gated due to PVCs and PACs). Recently with NYHA IIIB symptoms.   Cath today with essentially normal coronaries. EF 20-25%. Low filling pressures but CO 2.5/1.5. Admitted for inotropic support.  Now on milrinone. Feels some better. 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 Will continue milrinone for now. Start amiodarone. Keep K > 4.0 Mg 2.0.   HF team will assume care.   Myda Detwiler,MD 10:47 PM

## 2016-02-04 ENCOUNTER — Encounter (HOSPITAL_COMMUNITY): Payer: Self-pay | Admitting: Internal Medicine

## 2016-02-04 DIAGNOSIS — I5021 Acute systolic (congestive) heart failure: Secondary | ICD-10-CM

## 2016-02-04 DIAGNOSIS — I493 Ventricular premature depolarization: Secondary | ICD-10-CM

## 2016-02-04 LAB — BASIC METABOLIC PANEL
Anion gap: 9 (ref 5–15)
BUN: 18 mg/dL (ref 6–20)
CO2: 23 mmol/L (ref 22–32)
Calcium: 8.6 mg/dL — ABNORMAL LOW (ref 8.9–10.3)
Chloride: 103 mmol/L (ref 101–111)
Creatinine, Ser: 1.07 mg/dL (ref 0.61–1.24)
GFR calc Af Amer: >60 mL/min (ref >60)
GFR calc non Af Amer: >60 mL/min (ref >60)
Glucose, Bld: 247 mg/dL — ABNORMAL HIGH (ref 65–99)
Potassium: 3.6 mmol/L (ref 3.5–5.1)
Sodium: 135 mmol/L (ref 135–145)

## 2016-02-04 LAB — CARBOXYHEMOGLOBIN
Carboxyhemoglobin: 1.6 % — ABNORMAL HIGH (ref 0.5–1.5)
Methemoglobin: 0.6 % (ref 0.0–1.5)
O2 Saturation: 67 %
Total hemoglobin: 15.4 g/dL (ref 13.5–18.0)

## 2016-02-04 LAB — BRAIN NATRIURETIC PEPTIDE: B Natriuretic Peptide: 39.7 pg/mL (ref 0.0–100.0)

## 2016-02-04 LAB — MAGNESIUM: Magnesium: 1.8 mg/dL (ref 1.7–2.4)

## 2016-02-04 MED ORDER — SACUBITRIL-VALSARTAN 24-26 MG PO TABS
1.0000 | ORAL_TABLET | Freq: Two times a day (BID) | ORAL | Status: DC
  Administered 2016-02-04 – 2016-02-05 (×4): 1 via ORAL
  Filled 2016-02-04 (×5): qty 1

## 2016-02-04 MED ORDER — MILRINONE LACTATE IN DEXTROSE 20-5 MG/100ML-% IV SOLN
0.1250 ug/kg/min | INTRAVENOUS | Status: DC
  Administered 2016-02-04: 0.125 ug/kg/min via INTRAVENOUS

## 2016-02-04 MED ORDER — SPIRONOLACTONE 25 MG PO TABS
12.5000 mg | ORAL_TABLET | Freq: Every day | ORAL | Status: DC
  Administered 2016-02-04 – 2016-02-05 (×2): 12.5 mg via ORAL
  Filled 2016-02-04 (×2): qty 1

## 2016-02-04 MED ORDER — DIGOXIN 125 MCG PO TABS
0.1250 mg | ORAL_TABLET | Freq: Every day | ORAL | Status: DC
  Administered 2016-02-04 – 2016-02-05 (×2): 0.125 mg via ORAL
  Filled 2016-02-04 (×2): qty 1

## 2016-02-04 MED FILL — Verapamil HCl IV Soln 2.5 MG/ML: INTRAVENOUS | Qty: 2 | Status: AC

## 2016-02-04 NOTE — Progress Notes (Signed)
Advanced Heart Failure Rounding Note   Subjective:   Admitted post cath with low output HF and started on milrinone 0.25 mcg.  On amio drip 37m per hour for PVCs.   Denies SOB/orthopnea.   LHC/RHC - normal coronaries. EF 20-25%. Low filling pressures but CO 2.5/1.5   ECHO 12/2015 EF 20-25%  Grade 1DD RV normal.   Objective:   Weight Range:  Vital Signs:   Temp:  [97.4 F (36.3 C)-98.7 F (37.1 C)] 97.4 F (36.3 C) (06/14 0800) Pulse Rate:  [0-134] 65 (06/14 0839) Resp:  [0-34] 16 (06/14 0839) BP: (105-153)/(58-95) 135/83 mmHg (06/14 0800) SpO2:  [0 %-97 %] 93 % (06/14 0839) Weight:  [129 lb 4.8 oz (58.65 kg)] 129 lb 4.8 oz (58.65 kg) (06/14 0453) Last BM Date: 02/03/16  Weight change: Filed Weights   02/03/16 0845 02/04/16 0453  Weight: 130 lb (58.968 kg) 129 lb 4.8 oz (58.65 kg)    Intake/Output:   Intake/Output Summary (Last 24 hours) at 02/04/16 0945 Last data filed at 02/04/16 0600  Gross per 24 hour  Intake 853.61 ml  Output    900 ml  Net -46.39 ml     Physical Exam: CPV 5  General:  Well appearing. No resp difficulty. In bed  HEENT: normal Neck: supple. JVP 5-6 . Carotids 2+ bilat; no bruits. No lymphadenopathy or thryomegaly appreciated. Cor: PMI nondisplaced. Regular rate & rhythm. No rubs, gallops or murmurs. Lungs: clear Abdomen: soft, nontender, nondistended. No hepatosplenomegaly. No bruits or masses. Good bowel sounds. Extremities: no cyanosis, clubbing, rash, edema. RUE PICC  Neuro: alert & orientedx3, cranial nerves grossly intact. moves all 4 extremities w/o difficulty. Affect pleasant  Telemetry: NSR with PVCs 80s   Labs: Basic Metabolic Panel:  Recent Labs Lab 01/28/16 1435 02/03/16 1612 02/04/16 0451  NA 141  --  135  K 4.5  --  3.6  CL 105  --  103  CO2 27  --  23  GLUCOSE 69  --  247*  BUN 22  --  18  CREATININE 1.31* 1.09 1.07  CALCIUM 9.3  --  8.6*  MG  --   --  1.8    Liver Function Tests: No results for  input(s): AST, ALT, ALKPHOS, BILITOT, PROT, ALBUMIN in the last 168 hours. No results for input(s): LIPASE, AMYLASE in the last 168 hours. No results for input(s): AMMONIA in the last 168 hours.  CBC:  Recent Labs Lab 01/28/16 1435 02/03/16 1612  WBC 10.7 7.7  HGB 16.8 15.6  HCT 49.0 46.8  MCV 98.2 97.5  PLT 235 174    Cardiac Enzymes: No results for input(s): CKTOTAL, CKMB, CKMBINDEX, TROPONINI in the last 168 hours.  BNP: BNP (last 3 results)  Recent Labs  01/05/16 1014 02/04/16 0451  BNP 26.5 39.7    ProBNP (last 3 results) No results for input(s): PROBNP in the last 8760 hours.    Other results:  Imaging:  No results found.   Medications:     Scheduled Medications: . aspirin EC  81 mg Oral QHS  . furosemide  40 mg Oral Daily  . heparin  5,000 Units Subcutaneous Q8H  . irbesartan  75 mg Oral Daily  . mometasone-formoterol  2 puff Inhalation BID  . montelukast  10 mg Oral QHS  . multivitamin with minerals  1 tablet Oral Daily  . pantoprazole  40 mg Oral Daily  . sodium chloride flush  3 mL Intravenous Q12H  . sodium chloride  flush  3 mL Intravenous Q12H  . sodium chloride flush  3 mL Intravenous Q12H  . tiotropium  18 mcg Inhalation Daily     Infusions: . amiodarone 30 mg/hr (02/04/16 0500)  . milrinone 0.25 mcg/kg/min (02/03/16 1649)     PRN Medications:  sodium chloride, sodium chloride, sodium chloride, acetaminophen, azelastine, ipratropium-albuterol, ondansetron (ZOFRAN) IV, sodium chloride flush, sodium chloride flush, sodium chloride flush, sodium chloride flush   Assessment:  1. A/C Systolic Heart Failure 2. HTN 3. PVCs 4. COPD on oxygen at home     Plan/Discussion:    Admitted post cath with low output heart failure. Normal cors. Also had low filling pressures.  Prior to admit had had PVCs. ? PVC mediated cardiomyopathy  NYHA IIIb. Started on milrinone 0.25 mcg. Todays CO-OX is 67%.  Cut back 0.125 mcg. Add dig 0.125 mcg +  12.5 mg spiro daily.  No bb for now. Stop lasix. Stop Irbesartan. Add 24-26 mg entresto twice a day.  Renal function stable.   Continue amio drip to suppress PVCs.   Length of Stay: 1  Amy Clegg NP-C  02/04/2016, 9:45 AM  Advanced Heart Failure Team Pager [phone] (M-F; 7a - 4p)  Please contact [facility] for night-coverage after hours (4p -7a ) and weekends on [email or web address]  Patient seen and examined with Darrick Grinder, NP. We discussed all aspects of the encounter. I agree with the assessment and plan as stated above.   Started milrinone and amio last night. PVC count much improved 18-20/hr -> 5-6/hr. Feeling much better. Co-ox much improved. Will try to wean milrinone slowly. Long discussion with patient and family about natural history of PVC-cardiomyopathy.   Bensimhon, Daniel,MD 3:53 PM

## 2016-02-04 NOTE — Care Management Note (Addendum)
Case Management Note  Patient Details  Name: Bruce Travis MRN: [id number] Date of Birth: Jan 06, 1935  Subjective/Objective:  Pt admitted for Acute CHF- Pt initiated on IV Amio gtt/ Milrinone gtt. Referral for [facility] for home completed. Pt may benefit from [facility] RN once stable for d/c, unsure at this point if pt will need Home Milrinone.              Action/Plan:  CM will make pt aware of cost and provide pt with 30 day free card: [facility] @[facility]  RX # [phone]   ENTRESTO 24/26 MG BID (30 )   COVER- YES  CO-PAY- $38.07  TIER- 2 DRUG  PRIOR APPROVAL- NO  PHARMACY : [facility] [place]   Expected Discharge Date:                  Expected Discharge Plan:  [place]  In-House Referral:  NA  Discharge planning Services  CM Consult, Medication Assistance  Post Acute Care Choice:    Choice offered to:     DME Arranged:    DME Agency:     HH Arranged:    HH Agency:     Status of Service:  In process, will continue to follow  Medicare Important Message Given:    Date Medicare IM Given:    Medicare IM give by:    Date Additional Medicare IM Given:    Additional Medicare Important Message give by:     If discussed at [place] of Stay Meetings, dates discussed:    Additional Comments: 1114 02-06-16 Jacqlyn Krauss, RN,BSN [phone] Pt was d/c No HH Services needed. Milrinone was d/c and pt will not need [facility] Services. Pt with family Support. Medications were sent to [facility] did call to see if the medication is now in stock. Per yesterday they would have to order the medications. Pharmacists stated they can get the medication today.   [facility] has medication in stock. Pt usually uses [facility] for medications- they deliver to the patient.  CM did call and the medication is not in stock, however can be ordered. CM will make pt aware of cost and provide pt with an  agency list for possible [place]. CM will continue to monitor.   Bethena Roys, RN 02/04/2016, 4:11 PM

## 2016-02-04 NOTE — Progress Notes (Signed)
Noted that lab glucose was 247 mg/dl. Recommend checking HgbA1C for home blood glucose control.  No history of DM noted. Harvel Ricks RN BSN CDE

## 2016-02-05 LAB — BASIC METABOLIC PANEL
ANION GAP: 8 (ref 5–15)
BUN: 12 mg/dL (ref 6–20)
CHLORIDE: 107 mmol/L (ref 101–111)
CO2: 25 mmol/L (ref 22–32)
Calcium: 9 mg/dL (ref 8.9–10.3)
Creatinine, Ser: 1.06 mg/dL (ref 0.61–1.24)
GFR calc Af Amer: >60 mL/min (ref >60)
GLUCOSE: 104 mg/dL — AB (ref 65–99)
POTASSIUM: 3.9 mmol/L (ref 3.5–5.1)
Sodium: 140 mmol/L (ref 135–145)

## 2016-02-05 LAB — CARBOXYHEMOGLOBIN
Carboxyhemoglobin: 1.6 % — ABNORMAL HIGH (ref 0.5–1.5)
METHEMOGLOBIN: 0.7 % (ref 0.0–1.5)
O2 Saturation: 80.1 %
TOTAL HEMOGLOBIN: 15.3 g/dL (ref 13.5–18.0)

## 2016-02-05 LAB — CBC
HEMATOCRIT: 46 % (ref 39.0–52.0)
HEMOGLOBIN: 15.2 g/dL (ref 13.0–17.0)
MCH: 32.1 pg (ref 26.0–34.0)
MCHC: 33 g/dL (ref 30.0–36.0)
MCV: 97 fL (ref 78.0–100.0)
Platelets: 155 10*3/uL (ref 150–400)
RBC: 4.74 MIL/uL (ref 4.22–5.81)
RDW: 13.8 % (ref 11.5–15.5)
WBC: 6.3 10*3/uL (ref 4.0–10.5)

## 2016-02-05 NOTE — Telephone Encounter (Signed)
Closed encounter °

## 2016-02-05 NOTE — Progress Notes (Signed)
Advanced Heart Failure Rounding Note   Subjective:   Admitted post cath with low output HF and started on milrinone 0.25 mcg.  On amio drip 73m per hour for PVCs.   Coox 80% this am on Milrinone 0.125. No signs of infection. CVP 3-4  Feeling great.  Says he has occasional "chest discomfort", not related to activity.  Has improved.  Denies SOB. No lightheadedness or dizziness, but hasn't really been up at all.   Tele was off (for hospital) for majority of the night, but PVCs seem to remain in 3-6 per hour range. Much improved on amio.   LHC/RHC - normal coronaries. EF 20-25%. Low filling pressures but CO 2.5/1.5  ECHO 12/2015 EF 20-25%  Grade 1DD RV normal.   Objective:   Weight Range:  Vital Signs:   Temp:  [97.4 F (36.3 C)-98 F (36.7 C)] 97.8 F (36.6 C) (06/15 0500) Pulse Rate:  [63-70] 69 (06/15 0500) Resp:  [16-20] 18 (06/15 0500) BP: (123-136)/(56-83) 123/63 mmHg (06/15 0500) SpO2:  [91 %-93 %] 93 % (06/15 0500) Weight:  [127 lb 12.8 oz (57.97 kg)] 127 lb 12.8 oz (57.97 kg) (06/15 0500) Last BM Date: 02/04/16  Weight change: Filed Weights   02/03/16 0845 02/04/16 0453 02/05/16 0500  Weight: 130 lb (58.968 kg) 129 lb 4.8 oz (58.65 kg) 127 lb 12.8 oz (57.97 kg)    Intake/Output:   Intake/Output Summary (Last 24 hours) at 02/05/16 0704 Last data filed at 02/05/16 0451  Gross per 24 hour  Intake    963 ml  Output    725 ml  Net    238 ml     Physical Exam: CPV 3-4 General:  Well appearing. No resp difficulty. In bed  HEENT: normal Neck: supple. JVP flat . Carotids 2+ bilat; no bruits. No thyromegaly or nodule noted.  Cor: PMI nondisplaced. Regular rate & rhythm. No M/G/R noted. Lungs: CTAB, normal effort Abdomen: soft, NT, ND, no HSM. No bruits or masses. +BS  Extremities: no cyanosis, clubbing, rash, edema. RUE PICC  Neuro: alert & orientedx3, cranial nerves grossly intact. moves all 4 extremities w/o difficulty. Affect pleasant  Telemetry: NSR  70s  with PVCs   Labs: Basic Metabolic Panel:  Recent Labs Lab 02/03/16 1612 02/04/16 0451  NA  --  135  K  --  3.6  CL  --  103  CO2  --  23  GLUCOSE  --  247*  BUN  --  18  CREATININE 1.09 1.07  CALCIUM  --  8.6*  MG  --  1.8    Liver Function Tests: No results for input(s): AST, ALT, ALKPHOS, BILITOT, PROT, ALBUMIN in the last 168 hours. No results for input(s): LIPASE, AMYLASE in the last 168 hours. No results for input(s): AMMONIA in the last 168 hours.  CBC:  Recent Labs Lab 02/03/16 1612  WBC 7.7  HGB 15.6  HCT 46.8  MCV 97.5  PLT 174    Cardiac Enzymes: No results for input(s): CKTOTAL, CKMB, CKMBINDEX, TROPONINI in the last 168 hours.  BNP: BNP (last 3 results)  Recent Labs  01/05/16 1014 02/04/16 0451  BNP 26.5 39.7    ProBNP (last 3 results) No results for input(s): PROBNP in the last 8760 hours.    Other results:  Imaging: No results found.   Medications:     Scheduled Medications: . aspirin EC  81 mg Oral QHS  . digoxin  0.125 mg Oral Daily  . heparin  5,000 Units Subcutaneous Q8H  . mometasone-formoterol  2 puff Inhalation BID  . montelukast  10 mg Oral QHS  . multivitamin with minerals  1 tablet Oral Daily  . pantoprazole  40 mg Oral Daily  . sacubitril-valsartan  1 tablet Oral BID  . sodium chloride flush  3 mL Intravenous Q12H  . sodium chloride flush  3 mL Intravenous Q12H  . sodium chloride flush  3 mL Intravenous Q12H  . spironolactone  12.5 mg Oral Daily  . tiotropium  18 mcg Inhalation Daily    Infusions: . amiodarone 30 mg/hr (02/05/16 0347)  . milrinone 0.125 mcg/kg/min (02/04/16 2000)    PRN Medications: sodium chloride, sodium chloride, sodium chloride, acetaminophen, azelastine, ipratropium-albuterol, ondansetron (ZOFRAN) IV, sodium chloride flush, sodium chloride flush, sodium chloride flush, sodium chloride flush   Assessment:  1. A/C Systolic Heart Failure 2. HTN 3. PVCs 4. COPD on oxygen at home    Plan/Discussion:    Admitted post cath with low output heart failure. Normal cors. Also had low filling pressures.  Prior to admit had had PVCs. ? PVC mediated cardiomyopathy  NYHA IIIb. Todays CO-OX is 80% on milrinone 0.125. Can likely try and wean further today.   Continue dig 0.125 mcg + 12.5 mg spiro daily.  No bb for now. Continue to hold lasix with CVP 4-5.   Continue 24-26 mg entresto twice a day.  BMET pending.    Continue amio drip to suppress PVCs.   Ambulate as tolerated. Will consult PT.   Length of Stay: 2  Shirley Friar PA-C  02/05/2016, 7:04 AM  Advanced Heart Failure Team Pager [phone] (M-F; 7a - 4p)  Please contact [facility] for night-coverage after hours (4p -7a ) and weekends on [email or web address]   Patient seen and examined with Oda Kilts, PA-C. We discussed all aspects of the encounter. I agree with the assessment and plan as stated above.   Much improved. Tele reviewed and PVCs down to 1-2/minute. Co-ox better. Stop milrinone. Continue loading IV amio. Possible home in am on amio 200 bid.   Christpher Stogsdill,MD 5:12 PM

## 2016-02-06 ENCOUNTER — Other Ambulatory Visit (HOSPITAL_COMMUNITY): Payer: Self-pay | Admitting: Cardiology

## 2016-02-06 DIAGNOSIS — I255 Ischemic cardiomyopathy: Secondary | ICD-10-CM

## 2016-02-06 LAB — BASIC METABOLIC PANEL
Anion gap: 8 (ref 5–15)
BUN: 14 mg/dL (ref 6–20)
CALCIUM: 9.1 mg/dL (ref 8.9–10.3)
CO2: 24 mmol/L (ref 22–32)
CREATININE: 1.13 mg/dL (ref 0.61–1.24)
Chloride: 109 mmol/L (ref 101–111)
GFR calc Af Amer: >60 mL/min (ref >60)
GFR calc non Af Amer: 60 mL/min — ABNORMAL LOW (ref >60)
GLUCOSE: 94 mg/dL (ref 65–99)
Potassium: 3.8 mmol/L (ref 3.5–5.1)
SODIUM: 141 mmol/L (ref 135–145)

## 2016-02-06 LAB — CARBOXYHEMOGLOBIN
CARBOXYHEMOGLOBIN: 1.5 % (ref 0.5–1.5)
METHEMOGLOBIN: 0.7 % (ref 0.0–1.5)
O2 SAT: 72 %
TOTAL HEMOGLOBIN: 16.3 g/dL (ref 13.5–18.0)

## 2016-02-06 LAB — HEMOGLOBIN A1C
Hgb A1c MFr Bld: 5.6 % (ref 4.8–5.6)
MEAN PLASMA GLUCOSE: 114 mg/dL

## 2016-02-06 MED ORDER — DIGOXIN 125 MCG PO TABS: 0.1250 mg | ORAL_TABLET | Freq: Every day | ORAL | Status: DC

## 2016-02-06 MED ORDER — AMIODARONE HCL 200 MG PO TABS: 200.0000 mg | ORAL_TABLET | Freq: Two times a day (BID) | ORAL | Status: DC

## 2016-02-06 MED ORDER — SPIRONOLACTONE 25 MG PO TABS: 12.5000 mg | ORAL_TABLET | Freq: Every day | ORAL | Status: DC

## 2016-02-06 MED ORDER — SACUBITRIL-VALSARTAN 24-26 MG PO TABS: 1.0000 | ORAL_TABLET | Freq: Two times a day (BID) | ORAL | Status: DC

## 2016-02-06 NOTE — Discharge Instructions (Signed)
Physical Activity With Heart Disease Exercising has many benefits, even when you have heart disease. It can help control cholesterol and high blood pressure and improve sleep. It can make your bones and heart muscle stronger. If you exercise about 20-30 minutes per day, 5 times per week, you will be able to do more and feel healthier. Before starting an exercise program, talk with your health care provider about exercising. Your health care provider may recommend a physical exam before starting an exercise program. Your health care provider can match your fitness level with the right exercise program.  HOW CAN [place]? When exercise is done on a regular basis, there are many benefits. Exercise can:  Lower your blood pressure.  Lower your cholesterol.  Control your weight.  Improve your sleep.  Help control your blood sugars.  Improve your heart and lung function.  Strengthen your bones and reduce bone loss.  Reduce your risk of blood clots (thrombophlebitis).  Lower your risk of certain cancers.  Improve your energy level.  Reduce stress.  Make you feel better about yourself. HOW DO I BEGIN A HEART-HEALTHY EXERCISE PROGRAM?  Talk with your health care provider about how often to exercise and what types of exercise would be good for you. Ask your health care provider if:  You should engage in moderate or vigorous cardiovascular exercise.  There are any exercises you should avoid.  Your medicines should be taken at a certain time.  You should check your pulse or take other precautions during exercise.  Get a calendar. Write down a schedule and plan for your exercise routine.  Consider joining a community exercise program, such as a biking group, yoga class, or swimming pool membership. You can also join a local gym. You can find free workout applications on some phones or devices, or purchase workout DVDs and exercise on your own. Find out what  works for you.  After checking with your health care provider about approved activities, try to incorporate a variety of activities into your plan. For instance, you may do a yoga class Tuesdays and Thursdays, walk or climb stairs Mondays and Wednesdays, and then lift small weights while you are watching your favorite show on Sunday night.  If you have not been exercising, begin with sessions that last 10 to 15 minutes. Gradually work up to sessions that last 20 to 30 minutes. Try to exercise 5 times per week. Follow all of your health care provider's recommendations.  Be patient with yourself. It takes time to build up strength and lung capacity. WHAT ARE SOME TYPES OF EXERCISES I COULD TRY?  Cardiovascular exercise gets your heart and lungs working. Depending on your speed and intensity, a cardiovascular activity can be moderate or can become vigorous. Watch your pace and follow your health care provider's recommendations about the intensity of your workouts. Moderate cardiovascular exercise includes:  Walking.  Slow bicycling.  Water aerobics.  Doubles tennis.  Ballroom dancing.  Light gardening or yard work. Vigorous cardiovascular exercise includes:  Jogging or running.  Jumping rope.  Singles tennis.  Stair climbing.  Swimming laps.  Cross-country skiing.  Hiking uphill.  Heavy gardening, such as digging trenches. Strengthening exercises tense your muscles to build strength. Some examples include:  Doing push-ups and abdominal work.  Lifting small weights.  Using resistance bands.  Doing exercises with a medicine ball.  Doing pull-ups on a pull-up bar. Flexibility exercises lengthen your muscles to keep them flexible and less  tight and improve balance. Some examples include:  Stretching.  Yoga.  Tai chi.  [place] barre. WHAT OTHER THINGS WILL HELP ME BE SUCCESSFUL?  If you do not enjoy an activity, try a new one. Keep doing new things until you find  exercises that you like.  Drink plenty of water before, during, and after exercise.  Eat meals about 2 hours before you exercise. If you need to snack right before, eat fruit such as a banana or apple.  Wear clothing that is comfortable, fits well, and is appropriate for the weather. Also be sure to wear supportive shoes.  Warm up or stretch for 5 minutes before exercise. Slowly decrease your activity or cool down for 5 minutes after exercise. This can help prevent injury. Ask your health care provider for more information.   Always exercise within your abilities and be aware of what your body is able to handle.  If you belong to a gym, ask the staff to help you learn how to use equipment and exercise machines. Ask a physical therapist or trainer about proper form and techniques to prevent injury. ARE THERE ANY PRECAUTIONS I MIGHT NEED TO TAKE?  Depending on your condition, you may need to work closely with your health care provider or specialist to come up with an approved exercise program. Follow your health care provider's instructions for recovery, cardiac rehabilitation, and a long-term exercise plan.  Because of your heart condition, you are more sensitive to weather and environmental changes and need to be cautious. If there are extreme outdoor conditions, such as heat, humidity, or cold, you may need to exercise indoors. If chemicals or other pollutants in the air reach an unsafe level and there is an air pollution advisory, you may need to exercise indoors. Your local news, board of health, or hospital can provide information on air quality. Exercise in an indoor, climate-controlled facility if these conditions exist.  Monitor your pulse if directed by your health care provider.  If you feel tired, or have any heart symptoms, such as shortness of breath, dizziness, irregular heartbeat, or chest pains, stop exercising and call your health care provider or 911.  If you have certain  types of heart disease, you may need to take extra precautions. These may include:  Avoiding heavy lifting.  Avoiding strenuous activities.  Making sure you include a cool-down period to give your body time to adjust.  Understanding how your medicines can affect you during exercise. Certain medicines may cause heat intolerance and changes in blood sugar.  Knowing your regular symptoms. Slow down and rest when you need to. Call your health care provider if they happen more, last longer, or feel stronger.  Keeping nitroglycerin spray and tabletswith you at all times if you have angina. Use them as directed to prevent and treat symptoms. WHEN SHOULD I SEE [place] PROVIDER?   You have chest pain, shortness of breath, or feel very tired.  You have pain in the arm, shoulder, neck, or jaw.  You feel weak, dizzy, or light-headed.   You have an irregular heart rate or your heart rate is greater than 100 beats per minute (bpm) before exercise.   This information is not intended to replace advice given to you by your health care provider. Make sure you discuss any questions you have with your health care provider.   Document Released: 03/06/2014 Document Revised: 12/24/2014 Document Reviewed: 03/06/2014 Elsevier Interactive Patient Education [employer].

## 2016-02-06 NOTE — Care Management Important Message (Signed)
Important Message  Patient Details  Name: Bruce Travis MRN: [id number] Date of Birth: 1934-10-27   Medicare Important Message Given:  Yes    Nathen May 02/06/2016, 12:14 PM

## 2016-02-06 NOTE — Discharge Summary (Signed)
Advanced Heart Failure Discharge Note   Discharge Summary   Patient ID: Bruce Travis MRN: [id number], DOB/AGE: [birth year] 81 y.o. Admit date: 02/03/2016 D/C date:     02/06/2016   Primary Discharge Diagnoses:  1. 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  2. HTN 3. PVCs 4. COPD on oxygen at home   Hospital Course:  Bruce Travis is a 81 y.o. male with PMH of HTN, HLD, COPD, CAD, and Systolic HF with EF 47-42%.  Admitted post cath 5/95/63 with A/C systolic HF.     R/LHC demonstrated LVEF <25% with global hypokinesis and low output HF with CO 2.49/CI 1.49.   He was started on milrinone 0.25 mcg/kg/min for inotropic support.  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   On 02/04/16 Coox 67% so milrinone cut back.  Switched to [employer] and spiro and dig added.  Lasix held with CVP of 5.   Was noted to good PVC suppression on amio gtt.  18-20/hr -> 5-6/hr -> 0-1/hr on 02/06/16.  With adequate suppression was thought to be stable for discharge and transition from amio gtt to po amiodarone at 200 mg BID.   Milrinone stopped evening of 02/05/16 with stable mixed venous sat of 70% on 02/06/16. Will not need home milrinone.   Overall pt was actually positive 1.7 L with low CVP and lasix held.  Weight still shows down 3 lbs from admission.   CVP 3-4 on day of discharge so will continue to hold Lasix for now.   He will be followed up closely in the HF clinic as below, with labs next week due to starting digoxin, spiro, and Entresto.   Physical Exam CVP 3-4 General: Well appearing. In bed. NAD HEENT: normal Neck: supple. JVP flat . Carotids 2+ bilat; no bruits. No thyromegaly or nodule noted.  Cor: PMI nondisplaced. RRR. No M/G/R noted. Lungs: Clear, normal effort Abdomen: soft, non-tender, non-distended, no HSM. No bruits or masses. +BS  Extremities: no cyanosis,  clubbing, rash. No edema. RUE PICC in place ( to be pulled prior to D/C.  Neuro: alert & orientedx3, cranial nerves grossly intact. moves all 4 extremities w/o difficulty. Affect pleasant  Telemetry: Reviewed personally, NSR 70s.  0-1 PVC per hr.   Discharge Weight Range: 127 lbs Discharge Vitals: Blood pressure 138/85, pulse 58, temperature 97.8 F (36.6 C), temperature source Oral, resp. rate 18, height 5' 6"  (1.676 m), weight 127 lb 14.4 oz (58.015 kg), SpO2 96 %.  Labs: Lab Results  Component Value Date   WBC 6.3 02/05/2016   HGB 15.2 02/05/2016   HCT 46.0 02/05/2016   MCV 97.0 02/05/2016   PLT 155 02/05/2016     Recent Labs Lab 02/06/16 0510  NA 141  K 3.8  CL 109  CO2 24  BUN 14  CREATININE 1.13  CALCIUM 9.1  GLUCOSE 94   Lab Results  Component Value Date   CHOL 158 09/20/2014   HDL 36* 09/20/2014   LDLCALC 88 09/20/2014   TRIG 169* 09/20/2014   BNP (last 3 results)  Recent Labs  01/05/16 1014 02/04/16 0451  BNP 26.5 39.7    ProBNP (last 3 results) No results for input(s): PROBNP in the last 8760 hours.   Diagnostic Studies/Procedures   LHC findings 02/03/16 Left main: No left main - separate ostia  Left Anterior Descending  . Vessel is  small. The vessel exhibits minimal luminal irregularities.   . Mid LAD lesion, 25% stenosed. Discrete.    Left Circumflex  . Vessel is small. The vessel exhibits minimal luminal irregularities.    Right Coronary Artery  . Vessel is small. Vessel is angiographically normal.       Right Heart Hemodynamics 02/03/16 Fick Cardiac Output 2.49 L/min  Fick Cardiac Output Index 1.49 (L/min)/BSA  RA A Wave 3 mmHg  RA V Wave 2 mmHg  RA Mean 2 mmHg  RV Systolic Pressure 29 mmHg  RV Diastolic Pressure 1 mmHg  RV EDP 2 mmHg  PA Systolic Pressure 25 mmHg  PA Diastolic Pressure 15 mmHg  PA Mean 19 mmHg  PW A Wave 5 mmHg  PW V Wave 4 mmHg  PW Mean 3 mmHg  AO Systolic Pressure 016 mmHg  AO Diastolic Pressure 59  mmHg  AO Mean 80 mmHg  LV Systolic Pressure 010 mmHg  LV Diastolic Pressure 1 mmHg  LV EDP 6 mmHg  Arterial Occlusion Pressure Extended Systolic Pressure 932 mmHg  Arterial Occlusion Pressure Extended Diastolic Pressure 62 mmHg  Arterial Occlusion Pressure Extended Mean Pressure 84 mmHg  Left Ventricular Apex Extended Systolic Pressure 355 mmHg  Left Ventricular Apex Extended Diastolic Pressure 0 mmHg  Left Ventricular Apex Extended EDP Pressure 11 mmHg  QP/QS 1  TPVR Index 12.73 HRUI  TSVR Index 53.6 HRUI  PVR SVR Ratio 0.21  TPVR/TSVR Ratio 0.24     Discharge Medications     Medication List    STOP taking these medications        furosemide 40 MG tablet  Commonly known as:  LASIX     metoprolol succinate 25 MG 24 hr tablet  Commonly known as:  TOPROL XL     valsartan 80 MG tablet  Commonly known as:  DIOVAN      TAKE these medications        AMBULATORY NON FORMULARY MEDICATION  O2 2 Lpm continuous     amiodarone 200 MG tablet  Commonly known as:  PACERONE  Take 1 tablet (200 mg total) by mouth 2 (two) times daily.     aspirin EC 81 MG tablet  Take 81 mg by mouth daily.     azelastine 0.1 % nasal spray  Commonly known as:  ASTELIN  Place 2 sprays into both nostrils 2 (two) times daily as needed for rhinitis or allergies.     digoxin 0.125 MG tablet  Commonly known as:  LANOXIN  Take 1 tablet (0.125 mg total) by mouth daily.     ipratropium-albuterol 0.5-2.5 (3) MG/3ML Soln  Commonly known as:  DUONEB  Take 3 mLs by nebulization every 4 (four) hours as needed (For shortness of breath or wheezing.).     montelukast 10 MG tablet  Commonly known as:  SINGULAIR  TAKE 1 TABLET BY MOUTH DAILY.     multivitamin with minerals Tabs tablet  Take 1 tablet by mouth daily.     pantoprazole 40 MG tablet  Commonly known as:  PROTONIX  TAKE ONE TABLET BY MOUTH DAILY.     PROAIR HFA 108 (90 Base) MCG/ACT inhaler  Generic drug:  albuterol  INHALE 2 PUFFS EVERY 4  HOURS AS NEEDED FOR SHORTNESS OF BREATH.     sacubitril-valsartan 24-26 MG  Commonly known as:  ENTRESTO  Take 1 tablet by mouth 2 (two) times daily.     SPIRIVA HANDIHALER 18 MCG inhalation capsule  Generic drug:  tiotropium  INHALE  1 CAPSULE ONCE DAILY AS DIRECTED     spironolactone 25 MG tablet  Commonly known as:  ALDACTONE  Take 0.5 tablets (12.5 mg total) by mouth daily.     SYMBICORT 160-4.5 MCG/ACT inhaler  Generic drug:  budesonide-formoterol  INHALE 2 PUFFS FIRST THING IN MORNING AND THEN ANOTHER 2 PUFFS ABOUT 12 HOURS LATER.        Disposition   The patient will be discharged in stable condition to home. Discharge Instructions    Diet - low sodium heart healthy    Complete by:  As directed      Heart Failure patients record your daily weight using the same scale at the same time of day    Complete by:  As directed      Increase activity slowly    Complete by:  As directed           Follow-up Information    Follow up with Glori Bickers, MD On 02/13/2016.   Specialty:  Cardiology   Why:  at 1015 for lab draw.  Needs CMET, TSH, and Dig level   Contact information:   [place] [address] [phone]       Follow up with Glori Bickers, MD On 02/19/2016.   Specialty:  Cardiology   Why:  at 0940 for post hospital follow up. Please bring all of your medications to your appointment.  The code for parking is 0020.   Contact information:   [address] [place] [address] [phone]         Duration of Discharge Encounter: Greater than 35 minutes   Signed, Annamaria Helling 02/06/2016, 8:40 AM   Patient seen and examined with Oda Kilts, PA-C. We discussed all aspects of the encounter. I agree with the assessment and plan as stated above.   Doing well.  PVCs down to 1/min. Co-ox stable off milrinone. Can go home today on amio 200 bid. Entresto and spiro. F/u with me 2 weeks.    Bram Hottel,MD 8:52 AM

## 2016-02-13 ENCOUNTER — Other Ambulatory Visit (HOSPITAL_COMMUNITY)
Admission: RE | Admit: 2016-02-13 | Discharge: 2016-02-13 | Disposition: A | Discharge status: Home / Self Care | Payer: Medicare Other | Source: Ambulatory Visit | Attending: Cardiovascular Disease | Admitting: Cardiovascular Disease

## 2016-02-13 ENCOUNTER — Inpatient Hospital Stay (HOSPITAL_COMMUNITY): Admit: 2016-02-13 | Payer: Medicare Other

## 2016-02-13 DIAGNOSIS — I255 Ischemic cardiomyopathy: Secondary | ICD-10-CM | POA: Insufficient documentation

## 2016-02-13 LAB — COMPREHENSIVE METABOLIC PANEL
ALK PHOS: 59 U/L (ref 38–126)
ALT: 24 U/L (ref 17–63)
AST: 23 U/L (ref 15–41)
Albumin: 3.9 g/dL (ref 3.5–5.0)
Anion gap: 7 (ref 5–15)
BILIRUBIN TOTAL: 1.3 mg/dL — AB (ref 0.3–1.2)
BUN: 16 mg/dL (ref 6–20)
CALCIUM: 9.8 mg/dL (ref 8.9–10.3)
CO2: 23 mmol/L (ref 22–32)
CREATININE: 1.36 mg/dL — AB (ref 0.61–1.24)
Chloride: 109 mmol/L (ref 101–111)
GFR calc non Af Amer: 48 mL/min — ABNORMAL LOW (ref >60)
GFR, EST AFRICAN AMERICAN: 55 mL/min — AB (ref >60)
Glucose, Bld: 92 mg/dL (ref 65–99)
Potassium: 4.8 mmol/L (ref 3.5–5.1)
SODIUM: 139 mmol/L (ref 135–145)
Total Protein: 6.2 g/dL — ABNORMAL LOW (ref 6.5–8.1)

## 2016-02-13 LAB — TSH: TSH: 2.547 u[IU]/mL (ref 0.350–4.500)

## 2016-02-13 LAB — DIGOXIN LEVEL: Digoxin Level: 1.6 ng/mL (ref 0.8–2.0)

## 2016-02-19 ENCOUNTER — Ambulatory Visit (HOSPITAL_COMMUNITY)
Admit: 2016-02-19 | Discharge: 2016-02-19 | Disposition: A | Discharge status: Home / Self Care | Payer: Medicare Other | Attending: Internal Medicine | Admitting: Internal Medicine

## 2016-02-19 ENCOUNTER — Encounter (HOSPITAL_COMMUNITY): Payer: Self-pay | Admitting: Internal Medicine

## 2016-02-19 VITALS — BP 125/68 | HR 65 | Ht 66.0 in | Wt 131.0 lb

## 2016-02-19 DIAGNOSIS — Z8521 Personal history of malignant neoplasm of larynx: Secondary | ICD-10-CM | POA: Insufficient documentation

## 2016-02-19 DIAGNOSIS — I4510 Unspecified right bundle-branch block: Secondary | ICD-10-CM | POA: Insufficient documentation

## 2016-02-19 DIAGNOSIS — Z7982 Long term (current) use of aspirin: Secondary | ICD-10-CM | POA: Insufficient documentation

## 2016-02-19 DIAGNOSIS — Z8719 Personal history of other diseases of the digestive system: Secondary | ICD-10-CM | POA: Insufficient documentation

## 2016-02-19 DIAGNOSIS — Z87891 Personal history of nicotine dependence: Secondary | ICD-10-CM | POA: Diagnosis not present

## 2016-02-19 DIAGNOSIS — K900 Celiac disease: Secondary | ICD-10-CM | POA: Insufficient documentation

## 2016-02-19 DIAGNOSIS — K219 Gastro-esophageal reflux disease without esophagitis: Secondary | ICD-10-CM | POA: Insufficient documentation

## 2016-02-19 DIAGNOSIS — I2510 Atherosclerotic heart disease of native coronary artery without angina pectoris: Secondary | ICD-10-CM | POA: Insufficient documentation

## 2016-02-19 DIAGNOSIS — Z800 Family history of malignant neoplasm of digestive organs: Secondary | ICD-10-CM | POA: Diagnosis not present

## 2016-02-19 DIAGNOSIS — Z720 Tobacco use: Secondary | ICD-10-CM | POA: Diagnosis not present

## 2016-02-19 DIAGNOSIS — Z86010 Personal history of colonic polyps: Secondary | ICD-10-CM | POA: Insufficient documentation

## 2016-02-19 DIAGNOSIS — Z9981 Dependence on supplemental oxygen: Secondary | ICD-10-CM | POA: Diagnosis not present

## 2016-02-19 DIAGNOSIS — I130 Hypertensive heart and chronic kidney disease with heart failure and stage 1 through stage 4 chronic kidney disease, or unspecified chronic kidney disease: Secondary | ICD-10-CM | POA: Insufficient documentation

## 2016-02-19 DIAGNOSIS — J449 Chronic obstructive pulmonary disease, unspecified: Secondary | ICD-10-CM | POA: Insufficient documentation

## 2016-02-19 DIAGNOSIS — J9611 Chronic respiratory failure with hypoxia: Secondary | ICD-10-CM

## 2016-02-19 DIAGNOSIS — I252 Old myocardial infarction: Secondary | ICD-10-CM | POA: Diagnosis not present

## 2016-02-19 DIAGNOSIS — I5021 Acute systolic (congestive) heart failure: Secondary | ICD-10-CM | POA: Diagnosis not present

## 2016-02-19 DIAGNOSIS — E785 Hyperlipidemia, unspecified: Secondary | ICD-10-CM | POA: Diagnosis not present

## 2016-02-19 DIAGNOSIS — J9610 Chronic respiratory failure, unspecified whether with hypoxia or hypercapnia: Secondary | ICD-10-CM | POA: Insufficient documentation

## 2016-02-19 DIAGNOSIS — I493 Ventricular premature depolarization: Secondary | ICD-10-CM

## 2016-02-19 LAB — BASIC METABOLIC PANEL
ANION GAP: 7 (ref 5–15)
BUN: 15 mg/dL (ref 6–20)
CALCIUM: 9.6 mg/dL (ref 8.9–10.3)
CO2: 21 mmol/L — ABNORMAL LOW (ref 22–32)
Chloride: 110 mmol/L (ref 101–111)
Creatinine, Ser: 1.19 mg/dL (ref 0.61–1.24)
GFR calc Af Amer: >60 mL/min (ref >60)
GFR, EST NON AFRICAN AMERICAN: 56 mL/min — AB (ref >60)
Glucose, Bld: 83 mg/dL (ref 65–99)
POTASSIUM: 4.7 mmol/L (ref 3.5–5.1)
SODIUM: 138 mmol/L (ref 135–145)

## 2016-02-19 LAB — DIGOXIN LEVEL: DIGOXIN LVL: 1.1 ng/mL (ref 0.8–2.0)

## 2016-02-19 MED ORDER — AMIODARONE HCL 200 MG PO TABS: 200.0000 mg | ORAL_TABLET | Freq: Every day | ORAL | Status: DC

## 2016-02-19 NOTE — Addendum Note (Signed)
Encounter addended by: Effie Berkshire, RN on: 02/19/2016 11:17 AM<BR>     Documentation filed: [employer], Patient Instructions Section, Orders

## 2016-02-19 NOTE — Patient Instructions (Signed)
DECREASE Amiodarone to 200 mg once daily.  Routine lab work today. Will notify you of abnormal results, otherwise no news is good news!  Will refer you to pulmonary rehab at [facility]. They will call you to set up initial appointment.  Follow up 6 weeks with Dr. Haroldine Laws.  Do the following things EVERYDAY: 1) Weigh yourself in the morning before breakfast. Write it down and keep it in a log. 2) Take your medicines as prescribed 3) Eat low salt foods-Limit salt (sodium) to 2000 mg per day.  4) Stay as active as you can everyday 5) Limit all fluids for the day to less than 2 liters

## 2016-02-19 NOTE — Progress Notes (Signed)
Advanced Heart Failure Medication Review by a Pharmacist  Does the patient  feel that his/her medications are working for him/her?  yes  Has the patient been experiencing any side effects to the medications prescribed?  no  Does the patient measure his/her own blood pressure or blood glucose at home?  no   Does the patient have any problems obtaining medications due to transportation or finances?   no  Understanding of regimen: good Understanding of indications: good Potential of compliance: good Patient understands to avoid NSAIDs. Patient understands to avoid decongestants.  Issues to address at subsequent visits: None   Pharmacist comments:  [name] is a pleasant 81 yo M presenting with his wife and a recent hospital discharge medication list. Patient was recently discharged from hospital and all medications have been reviewed. He reports good compliance with his regimen and did not have any specific medication-related questions or concerns for me at this time.   [name]. [name], PharmD, BCPS, CPP Clinical Pharmacist Pager: [phone] Phone: [phone] 02/19/2016 9:57 AM      Time with patient: 10 minutes Preparation and documentation time: 2 minutes Total time: 12 minutes

## 2016-02-19 NOTE — Progress Notes (Signed)
Patient ID: Bruce Travis, male   DOB: 19-Mar-1935, 81 y.o.   MRN: [id number]    OFFICE NOTE  Chief Complaint:  PAC's and PVC's, abnormal ekg, DOE  Primary Care Physician: Bruce Fraction, MD  HPI:  Bruce Travis is a 81 y.o. male with COPD (O2 dependent). HTN, HL and systolic HF fleto to be related to frequent PVCs.   Admitted in June 2017 with acute HF. Echo with EF 25%. Cath twith essentially normal coronaries. EF 20-25%. Low filling pressures but CO 2.5/1.5. Started on milrinone. 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 Started on amiodarone. PVCs went from ~20/minute to 1-2/minute by time of discharge. Milrinone weaned off.   Returns for post-hospital f/u. Feeling better but not back to baseline. Still very tired with activity. Wife says some of this is chronic. Denies edema, orthopnea or PND. Weight stable 127-129. Tolerating new meds well. Says he walks a lot every day. But needed wheelchair when walking a long way to the Clinic today.    PMHx:  Past Medical History  Diagnosis Date  . Personal history of colonic polyps 04/26/2007    hyperplastic   . Diverticulosis of colon (without mention of hemorrhage)   . Family hx of colon cancer   . COPD (chronic obstructive pulmonary disease) ([place])   . Esophageal reflux   . Hypertension   . Hyperlipemia   . CAD (coronary artery disease)   . Other diseases of lung, not elsewhere classified   . Allergic rhinitis   . Bronchiectasis   . Celiac disease   . Family history of adverse reaction to anesthesia     daughter gets PONV  . Chronic heart failure ([facility])   . Myocardial infarction [facility])     "previous MI/echo in 12/2015"  . On home oxygen therapy     "2L; 24/7" (02/03/2016)  . Pneumonia "several times"  . History of stomach ulcers 1980s  . Nephrolithiasis     "got them now; never had OR/scopes" (02/03/2016)  . Chronic kidney disease (CKD), stage III (moderate)   . 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 Vasectomy    . Cardiac catheterization  02/03/2016  . Epiglotoplasty w/ mlb      removal due to carcinoma  . Excisional hemorrhoidectomy  2000s  . Hand surgery Right 1958    d/t crush injury  . Tonsillectomy and adenoidectomy    . Cardiac catheterization  09/30/2009    non-obstructive CAD w/30% narrowing in prox LAD (Dr. Waunita Schooner)  . Cardiac catheterization  05/04/2001    same at 2011 cath (Dr. Waunita Schooner)  . Ultrasound guidance for vascular access  02/03/2016    Procedure: Ultrasound Guidance For Vascular Access;  Surgeon: Pixie Casino, MD;  Location: [place] [facility];  Service: Cardiovascular;;  . Cardiac catheterization N/A 02/03/2016    Procedure: Right/Left Heart Cath and Coronary Angiography;  Surgeon: Pixie Casino, MD;  Location: [place] [facility];  Service: Cardiovascular;  Laterality: N/A;    FAMHx:  Family History  Problem Relation Age of Onset  . Heart disease Father   . Colon cancer Father   . Prostate cancer Father   . Stroke Mother   . Endometrial cancer Sister   . Stroke Maternal Grandmother   . Cancer Paternal Grandmother     SOCHx:   reports that he quit smoking about 27 years ago. His smoking use  included Cigarettes. He has a 70 pack-year smoking history. He quit smokeless tobacco use about 14 years ago. His smokeless tobacco use included Chew. He reports that he does not drink alcohol or use illicit drugs.  ALLERGIES:  Allergies  Allergen Reactions  . Adhesive [Tape] Other (See Comments)    Band-aids - tears skin off  . Codeine Other (See Comments)    Thought head was going to blow off  . Gluten Meal Other (See Comments)    Celiac disease    ROS: Pertinent items noted in HPI and remainder of comprehensive ROS otherwise negative.  HOME MEDS: Current Outpatient Prescriptions  Medication Sig Dispense Refill  . AMBULATORY NON FORMULARY MEDICATION O2 2 Lpm continuous    . amiodarone (PACERONE) 200  MG tablet Take 1 tablet (200 mg total) by mouth 2 (two) times daily. 60 tablet 6  . aspirin EC 81 MG tablet Take 81 mg by mouth daily.    Marland Kitchen azelastine (ASTELIN) 0.1 % nasal spray Place 2 sprays into both nostrils 2 (two) times daily as needed for rhinitis or allergies.     Marland Kitchen digoxin (LANOXIN) 0.125 MG tablet Take 1 tablet (0.125 mg total) by mouth daily. 32 tablet 6  . montelukast (SINGULAIR) 10 MG tablet TAKE 1 TABLET BY MOUTH DAILY. 90 tablet 3  . Multiple Vitamin (MULTIVITAMIN WITH MINERALS) TABS tablet Take 1 tablet by mouth daily.    . pantoprazole (PROTONIX) 40 MG tablet TAKE ONE TABLET BY MOUTH DAILY. 30 tablet 3  . polyethylene glycol (MIRALAX / GLYCOLAX) packet Take 17 g by mouth daily.    Marland Kitchen PROAIR HFA 108 (90 BASE) MCG/ACT inhaler INHALE 2 PUFFS EVERY 4 HOURS AS NEEDED FOR SHORTNESS OF BREATH. 8.5 g 5  . sacubitril-valsartan (ENTRESTO) 24-26 MG Take 1 tablet by mouth 2 (two) times daily. 60 tablet 6  . SPIRIVA HANDIHALER 18 MCG inhalation capsule INHALE 1 CAPSULE ONCE DAILY AS DIRECTED 30 capsule 11  . spironolactone (ALDACTONE) 25 MG tablet Take 0.5 tablets (12.5 mg total) by mouth daily. 16 tablet 6  . SYMBICORT 160-4.5 MCG/ACT inhaler INHALE 2 PUFFS FIRST THING IN MORNING AND THEN ANOTHER 2 PUFFS ABOUT 12 HOURS LATER. 10.2 g 11   No current facility-administered medications for this encounter.    LABS/IMAGING: No results found for this or any previous visit (from the past 48 hour(s)). No results found.  WEIGHTS: Wt Readings from Last 3 Encounters:  02/19/16 131 lb (59.421 kg)  02/06/16 127 lb 14.4 oz (58.015 kg)  01/06/16 132 lb 9.6 oz (60.147 kg)    VITALS: BP 125/68 mmHg  Pulse 65  Ht 5' 6"  (1.676 m)  Wt 131 lb (59.421 kg)  BMI 21.15 kg/m2  SpO2 92%  EXAM: General:  Elderly NAD. Wearing O2. No resp difficulty HEENT: normal Neck: supple. no JVD. Carotids 2+ bilat; no bruits. No lymphadenopathy or thryomegaly appreciated. Cor: PMI nonpalpable. Distant heart  sounds. Regular  Lungs: clear with decreased BS Abdomen: soft, nontender, nondistended. No hepatosplenomegaly. No bruits or masses. Good bowel sounds. Extremities: no cyanosis, clubbing, rash, edema Neuro: alert & orientedx3, cranial nerves grossly intact. moves all 4 extremities w/o difficulty. Affect pleasant  EKG: Sinus rhythm at 66  No PACs or PVCs    ASSESSMENT: 1. 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 Chronic respiratory failure due to COPD on home O2 4. HTN  Much improved. Still NYHA III but mainly due to lung disease. PVCs now  suppressed. Volume status looks good. Decreased amio to 200 daily. Increase Entresto to 49/51. No b-blocker yet. Check BMET, digoxin level.   RTC in 6 weeks. Will need repeat echo in several months.   Glori Bickers MD 02/19/2016, 10:25 AM  Addendum: Bedside echo today. With EF 55%. Complete LV recovery. Refer to Pulmonary Rehab  Total time spent 45 minutes. Over half that time spent discussing above.    Bensimhon, Daniel,MD 10:58 AM

## 2016-02-25 ENCOUNTER — Telehealth (HOSPITAL_COMMUNITY): Payer: Self-pay | Admitting: *Deleted

## 2016-02-25 NOTE — Telephone Encounter (Signed)
-----   Message from Jolaine Artist, MD sent at 02/24/2016  9:50 PM EDT ----- Stop digoxin

## 2016-02-25 NOTE — Telephone Encounter (Signed)
Notes Recorded by Scarlette Calico, RN on 02/25/2016 at 10:30 AM Pt aware and agreeable

## 2016-02-27 ENCOUNTER — Ambulatory Visit: Payer: Medicare Other | Admitting: Internal Medicine

## 2016-03-05 ENCOUNTER — Telehealth (HOSPITAL_COMMUNITY): Payer: Self-pay | Admitting: *Deleted

## 2016-03-05 NOTE — Telephone Encounter (Signed)
Pt called and asked for [place]. He stated his co pay for Delene Loll is $38.70 he was instructed to call Doroteo Bradford to let her know the copay cost. Pt said for Doroteo Bradford to give him a call Monday.

## 2016-03-09 ENCOUNTER — Telehealth (HOSPITAL_COMMUNITY): Payer: Self-pay | Admitting: Pharmacist

## 2016-03-09 ENCOUNTER — Other Ambulatory Visit: Payer: Medicare Other

## 2016-03-09 DIAGNOSIS — E782 Mixed hyperlipidemia: Secondary | ICD-10-CM

## 2016-03-09 DIAGNOSIS — I10 Essential (primary) hypertension: Secondary | ICD-10-CM

## 2016-03-09 DIAGNOSIS — Z0000 Encounter for general adult medical examination without abnormal findings: Secondary | ICD-10-CM

## 2016-03-09 DIAGNOSIS — Z125 Encounter for screening for malignant neoplasm of prostate: Secondary | ICD-10-CM

## 2016-03-09 DIAGNOSIS — Z79899 Other long term (current) drug therapy: Secondary | ICD-10-CM

## 2016-03-09 LAB — LIPID PANEL
CHOLESTEROL: 208 mg/dL — AB (ref 125–200)
HDL: 33 mg/dL — AB (ref >=40)
LDL Cholesterol: 132 mg/dL — ABNORMAL HIGH (ref <130)
TRIGLYCERIDES: 217 mg/dL — AB (ref <150)
Total CHOL/HDL Ratio: 6.3 Ratio — ABNORMAL HIGH (ref <=5.0)
VLDL: 43 mg/dL — ABNORMAL HIGH (ref <30)

## 2016-03-09 LAB — COMPREHENSIVE METABOLIC PANEL
ALBUMIN: 4.1 g/dL (ref 3.6–5.1)
ALK PHOS: 58 U/L (ref 40–115)
ALT: 26 U/L (ref 9–46)
AST: 22 U/L (ref 10–35)
BILIRUBIN TOTAL: 0.8 mg/dL (ref 0.2–1.2)
BUN: 16 mg/dL (ref 7–25)
CALCIUM: 9.4 mg/dL (ref 8.6–10.3)
CO2: 26 mmol/L (ref 20–31)
Chloride: 104 mmol/L (ref 98–110)
Creat: 1.34 mg/dL — ABNORMAL HIGH (ref 0.70–1.18)
Glucose, Bld: 89 mg/dL (ref 70–99)
POTASSIUM: 4.6 mmol/L (ref 3.5–5.3)
Sodium: 138 mmol/L (ref 135–146)
Total Protein: 5.9 g/dL — ABNORMAL LOW (ref 6.1–8.1)

## 2016-03-09 LAB — CBC WITH DIFFERENTIAL/PLATELET
BASOS ABS: 66 {cells}/uL (ref 0–200)
Basophils Relative: 1 %
EOS ABS: 330 {cells}/uL (ref 15–500)
Eosinophils Relative: 5 %
HEMATOCRIT: 51.5 % — AB (ref 38.5–50.0)
HEMOGLOBIN: 17.4 g/dL — AB (ref 13.0–17.0)
LYMPHS PCT: 23 %
Lymphs Abs: 1518 cells/uL (ref 850–3900)
MCH: 34.2 pg — ABNORMAL HIGH (ref 27.0–33.0)
MCHC: 33.8 g/dL (ref 32.0–36.0)
MCV: 101.2 fL — AB (ref 80.0–100.0)
MPV: 10.7 fL (ref 7.5–12.5)
Monocytes Absolute: 660 cells/uL (ref 200–950)
Monocytes Relative: 10 %
NEUTROS ABS: 4026 {cells}/uL (ref 1500–7800)
NEUTROS PCT: 61 %
Platelets: 161 10*3/uL (ref 140–400)
RBC: 5.09 MIL/uL (ref 4.20–5.80)
RDW: 14.1 % (ref 11.0–15.0)
WBC: 6.6 10*3/uL (ref 3.8–10.8)

## 2016-03-09 NOTE — Telephone Encounter (Signed)
Patient called stating that $38/mo copay for Delene Loll is too costly for him. I have enrolled him in the PAN foundation so that he will have $800 toward his copay costs through 03/08/16. Relayed info to [employer] who verified $0 copay.  Billing ID: [id number] Person Code: [place] Group: [id number] RX BIN: [id number] PCN for Part D: [id number]    [name]. [name], PharmD, BCPS, CPP Clinical Pharmacist Pager: [phone] Phone: [phone] 03/09/2016 9:04 AM

## 2016-03-10 LAB — PSA, MEDICARE: PSA: 1.57 ng/mL (ref <=4.00)

## 2016-03-11 ENCOUNTER — Ambulatory Visit (INDEPENDENT_AMBULATORY_CARE_PROVIDER_SITE_OTHER): Payer: Medicare Other | Admitting: Family Medicine

## 2016-03-11 ENCOUNTER — Encounter: Payer: Self-pay | Admitting: Family Medicine

## 2016-03-11 VITALS — BP 140/80 | HR 84 | Temp 97.5°F | Resp 18 | Ht 66.5 in | Wt 132.0 lb

## 2016-03-11 DIAGNOSIS — Z0000 Encounter for general adult medical examination without abnormal findings: Secondary | ICD-10-CM | POA: Diagnosis not present

## 2016-03-11 NOTE — Progress Notes (Signed)
Subjective:    Patient ID: Bruce Travis, male    DOB: Nov 09, 1934, 81 y.o.   MRN: [id number]  HPI  Since last time I saw this patient, he saw the cardiologist due to his dyspnea on exertion and frequent PVCs. He was found to have an ejection fraction of 20-25%. Cardiac catheterization revealed minimal coronary artery disease that was noncontributory. It appears that it was attributed to his frequent PVCs. He was started on amiodarone. He was also started on spironolactone as well as entresto under the care of his cardiologist.  Recently saw his cardiologist and his injection fraction on a bedside echocardiogram was found to be 55%. The patient feels much better. He continues to raise about the cardiologist. He is very appreciative of all the above and to help him. He is here today for a physical exam. Immunizations are up-to-date as evidenced below: Immunization History  Administered Date(s) Administered  . H1N1 07/30/2008  . Influenza Split 05/24/2011  . Influenza Whole 06/23/2009, 06/02/2010, 05/23/2012  . Influenza,inj,Quad PF,36+ Mos 05/11/2013, 05/21/2014, 05/12/2015  . Pneumococcal Conjugate-13 08/14/2013  . Pneumococcal Polysaccharide-23 10/28/2014  . Td 03/20/2009  . Tdap 04/17/2012   Due to the patient's age and poor health, I have recommended against any type of colonoscopy or prostate cancer screening. Coincidentally a PSA was drawn in his lab work which was normal. The remainder of his preventative care is up-to-date. His most recent lab work as listed below: Lab on 03/09/2016  Component Date Value Ref Range Status  . WBC 03/09/2016 6.6  3.8 - 10.8 K/uL Final  . RBC 03/09/2016 5.09  4.20 - 5.80 MIL/uL Final  . Hemoglobin 03/09/2016 17.4* 13.0 - 17.0 g/dL Final  . HCT 03/09/2016 51.5* 38.5 - 50.0 % Final  . MCV 03/09/2016 101.2* 80.0 - 100.0 fL Final  . MCH 03/09/2016 34.2* 27.0 - 33.0 pg Final  . MCHC 03/09/2016 33.8  32.0 - 36.0 g/dL Final  . RDW 03/09/2016 14.1  11.0 - 15.0 %  Final  . Platelets 03/09/2016 161  140 - 400 K/uL Final  . MPV 03/09/2016 10.7  7.5 - 12.5 fL Final  . Neutro Abs 03/09/2016 4026  1500 - 7800 cells/uL Final  . Lymphs Abs 03/09/2016 1518  850 - 3900 cells/uL Final  . Monocytes Absolute 03/09/2016 660  200 - 950 cells/uL Final  . Eosinophils Absolute 03/09/2016 330  15 - 500 cells/uL Final  . Basophils Absolute 03/09/2016 66  0 - 200 cells/uL Final  . Neutrophils Relative % 03/09/2016 61   Final  . Lymphocytes Relative 03/09/2016 23   Final  . Monocytes Relative 03/09/2016 10   Final  . Eosinophils Relative 03/09/2016 5   Final  . Basophils Relative 03/09/2016 1   Final  . Smear Review 03/09/2016 Criteria for review not met   Final   ** Please note change in unit of measure and reference range(s). **  . Sodium 03/09/2016 138  135 - 146 mmol/L Final  . Potassium 03/09/2016 4.6  3.5 - 5.3 mmol/L Final  . Chloride 03/09/2016 104  98 - 110 mmol/L Final  . CO2 03/09/2016 26  20 - 31 mmol/L Final  . Glucose, Bld 03/09/2016 89  70 - 99 mg/dL Final  . BUN 03/09/2016 16  7 - 25 mg/dL Final  . Creat 03/09/2016 1.34* 0.70 - 1.18 mg/dL Final   Comment:   For patients > or = 79 years of age: The upper reference limit for Creatinine is approximately 13%  higher for people identified as African-American.     . Total Bilirubin 03/09/2016 0.8  0.2 - 1.2 mg/dL Final  . Alkaline Phosphatase 03/09/2016 58  40 - 115 U/L Final  . AST 03/09/2016 22  10 - 35 U/L Final  . ALT 03/09/2016 26  9 - 46 U/L Final  . Total Protein 03/09/2016 5.9* 6.1 - 8.1 g/dL Final  . Albumin 03/09/2016 4.1  3.6 - 5.1 g/dL Final  . Calcium 03/09/2016 9.4  8.6 - 10.3 mg/dL Final  . PSA 03/09/2016 1.57  <=4.00 ng/mL Final   Comment: Test Methodology: ECLIA PSA (Electrochemiluminescence Immunoassay)   For PSA values from 2.5-4.0, particularly in younger men <56 years old, the AUA and NCCN suggest testing for % Free PSA (3515) and evaluation of the rate of increase in PSA (PSA  velocity).   . Cholesterol 03/09/2016 208* 125 - 200 mg/dL Final  . Triglycerides 03/09/2016 217* <150 mg/dL Final  . HDL 03/09/2016 33* >=40 mg/dL Final  . Total CHOL/HDL Ratio 03/09/2016 6.3* <=5.0 Ratio Final  . VLDL 03/09/2016 43* <30 mg/dL Final  . LDL Cholesterol 03/09/2016 132* <130 mg/dL Final   Comment:   Total Cholesterol/HDL Ratio:CHD Risk                        Coronary Heart Disease Risk Table                                        Men       Women          1/2 Average Risk              3.4        3.3              Average Risk              5.0        4.4           2X Average Risk              9.6        7.1           3X Average Risk             23.4       11.0 Use the calculated Patient Ratio above and the CHD Risk table  to determine the patient's CHD Risk.   Hospital Outpatient Visit on 02/19/2016  Component Date Value Ref Range Status  . Sodium 02/19/2016 138  135 - 145 mmol/L Final  . Potassium 02/19/2016 4.7  3.5 - 5.1 mmol/L Final  . Chloride 02/19/2016 110  101 - 111 mmol/L Final  . CO2 02/19/2016 21* 22 - 32 mmol/L Final  . Glucose, Bld 02/19/2016 83  65 - 99 mg/dL Final  . BUN 02/19/2016 15  6 - 20 mg/dL Final  . Creatinine, Ser 02/19/2016 1.19  0.61 - 1.24 mg/dL Final  . Calcium 02/19/2016 9.6  8.9 - 10.3 mg/dL Final  . GFR calc non Af Amer 02/19/2016 56* >60 mL/min Final  . GFR calc Af Amer 02/19/2016 >60  >60 mL/min Final   Comment: (NOTE) The eGFR has been calculated using the CKD EPI equation. This calculation has not been validated in all clinical situations. eGFR's persistently <60 mL/min signify possible Chronic Kidney Disease.   Marland Kitchen  Anion gap 02/19/2016 7  5 - 15 Final  . Digoxin Level 02/19/2016 1.1  0.8 - 2.0 ng/mL Final   Past Medical History  Diagnosis Date  . Personal history of colonic polyps 04/26/2007    hyperplastic   . Diverticulosis of colon (without mention of hemorrhage)   . Family hx of colon cancer   . COPD (chronic obstructive  pulmonary disease) ([place])   . Esophageal reflux   . Hypertension   . Hyperlipemia   . CAD (coronary artery disease)   . Other diseases of lung, not elsewhere classified   . Allergic rhinitis   . Bronchiectasis   . Celiac disease   . Family history of adverse reaction to anesthesia     daughter gets PONV  . Chronic heart failure ([place])   . Myocardial infarction [facility])     "previous MI/echo in 12/2015"  . On home oxygen therapy     "2L; 24/7" (02/03/2016)  . Pneumonia "several times"  . History of stomach ulcers 1980s  . Nephrolithiasis     "got them now; never had OR/scopes" (02/03/2016)  . Chronic kidney disease (CKD), stage III (moderate)   . 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 Vasectomy    . Cardiac catheterization  02/03/2016  . Epiglotoplasty w/ mlb      removal due to carcinoma  . Excisional hemorrhoidectomy  2000s  . Hand surgery Right 1958    d/t crush injury  . Tonsillectomy and adenoidectomy    . Cardiac catheterization  09/30/2009    non-obstructive CAD w/30% narrowing in prox LAD (Dr. Waunita Schooner)  . Cardiac catheterization  05/04/2001    same at 2011 cath (Dr. Waunita Schooner)  . Ultrasound guidance for vascular access  02/03/2016    Procedure: Ultrasound Guidance For Vascular Access;  Surgeon: Pixie Casino, MD;  Location:  CV LAB;  Service: Cardiovascular;;  . Cardiac catheterization N/A 02/03/2016    Procedure: Right/Left Heart Cath and Coronary Angiography;  Surgeon: Pixie Casino, MD;  Location: [place] [facility];  Service: Cardiovascular;  Laterality: N/A;   Current Outpatient Prescriptions on File Prior to Visit  Medication Sig Dispense Refill  . AMBULATORY NON FORMULARY MEDICATION O2 2 Lpm continuous    . amiodarone (PACERONE) 200 MG tablet Take 1 tablet (200 mg total) by mouth daily. 30 tablet 6  . aspirin EC 81 MG tablet Take 81 mg by mouth daily.    Marland Kitchen azelastine  (ASTELIN) 0.1 % nasal spray Place 2 sprays into both nostrils 2 (two) times daily as needed for rhinitis or allergies.     . montelukast (SINGULAIR) 10 MG tablet TAKE 1 TABLET BY MOUTH DAILY. 90 tablet 3  . Multiple Vitamin (MULTIVITAMIN WITH MINERALS) TABS tablet Take 1 tablet by mouth daily.    . pantoprazole (PROTONIX) 40 MG tablet TAKE ONE TABLET BY MOUTH DAILY. 30 tablet 3  . polyethylene glycol (MIRALAX / GLYCOLAX) packet Take 17 g by mouth daily.    Marland Kitchen PROAIR HFA 108 (90 BASE) MCG/ACT inhaler INHALE 2 PUFFS EVERY 4 HOURS AS NEEDED FOR SHORTNESS OF BREATH. 8.5 g 5  . sacubitril-valsartan (ENTRESTO) 24-26 MG Take 1 tablet by mouth 2 (two) times daily. 60 tablet 6  . SPIRIVA HANDIHALER 18 MCG inhalation capsule INHALE 1 CAPSULE ONCE DAILY AS DIRECTED 30 capsule 11  . spironolactone (ALDACTONE) 25 MG tablet Take 0.5 tablets (12.5 mg  total) by mouth daily. 16 tablet 6  . SYMBICORT 160-4.5 MCG/ACT inhaler INHALE 2 PUFFS FIRST THING IN MORNING AND THEN ANOTHER 2 PUFFS ABOUT 12 HOURS LATER. 10.2 g 11   No current facility-administered medications on file prior to visit.   Allergies  Allergen Reactions  . Adhesive [Tape] Other (See Comments)    Band-aids - tears skin off  . Codeine Other (See Comments)    Thought head was going to blow off  . Gluten Meal Other (See Comments)    Celiac disease   Social History   Social History  . Marital Status: Married    Spouse Name: N/A  . Number of Children: 3  . Years of Education: N/A   Occupational History  . retired     Psychologist, sport and exercise and Administrator   Social History Main Topics  . Smoking status: Former Smoker -- 2.00 packs/day for 35 years    Types: Cigarettes    Quit date: 01/21/1989  . Smokeless tobacco: Former Systems developer    Types: Chew    Quit date: 08/23/2001  . Alcohol Use: No  . Drug Use: No  . Sexual Activity: Not on file   Other Topics Concern  . Not on file   Social History Narrative   Family History  Problem Relation Age of Onset   . Heart disease Father   . Colon cancer Father   . Prostate cancer Father   . Stroke Mother   . Endometrial cancer Sister   . Stroke Maternal Grandmother   . Cancer Paternal Grandmother      Review of Systems  All other systems reviewed and are negative.      Objective:   Physical Exam  Constitutional: He is oriented to person, place, and time. He appears well-developed and well-nourished. No distress.  HENT:  Head: Normocephalic and atraumatic.  Right Ear: External ear normal.  Left Ear: External ear normal.  Nose: Nose normal.  Mouth/Throat: Oropharynx is clear and moist. No oropharyngeal exudate.  Eyes: Conjunctivae and EOM are normal. Pupils are equal, round, and reactive to light. Right eye exhibits no discharge. Left eye exhibits no discharge. No scleral icterus.  Neck: Normal range of motion. Neck supple. No JVD present. No tracheal deviation present. No thyromegaly present.  Cardiovascular: Normal rate, regular rhythm, normal heart sounds and intact distal pulses.  Exam reveals no gallop and no friction rub.   No murmur heard. Pulmonary/Chest: Effort normal. No stridor. No respiratory distress. He has decreased breath sounds. He has no wheezes. He has no rales. He exhibits no tenderness.  Abdominal: Soft. Bowel sounds are normal. He exhibits no distension and no mass. There is no tenderness. There is no rebound and no guarding.  Musculoskeletal: Normal range of motion. He exhibits no edema or tenderness.  Lymphadenopathy:    He has no cervical adenopathy.  Neurological: He is alert and oriented to person, place, and time. He has normal reflexes. He displays normal reflexes. No cranial nerve deficit. He exhibits normal muscle tone. Coordination normal.  Skin: Skin is warm. No rash noted. He is not diaphoretic. No erythema. No pallor.  Psychiatric: He has a normal mood and affect. His behavior is normal. Judgment and thought content normal.  Vitals reviewed.           Assessment & Plan:  Routine general medical examination at a health care facility  Patient looks and feels much better. I am very appreciative of our cardiology has done to help him. His immunizations  are up-to-date. His lab work is excellent. His LDL cholesterol is slightly elevated. However given his advanced age and the mild coronary disease found on his catheterization, patient elects not to start statin therapy at the present time. He believes he is taking a lot of medication as he has a taste take another. I recommended against a colonoscopy as well as a digital rectal exam for prostate cancer given his advanced age. I would like to see the patient back every 3 months to monitor his electrolytes on the medications he is taking for heart failure typically his potassium and his renal function. At the present time they're stable

## 2016-03-16 ENCOUNTER — Other Ambulatory Visit: Payer: Self-pay | Admitting: Family Medicine

## 2016-03-22 ENCOUNTER — Encounter (HOSPITAL_COMMUNITY): Payer: Self-pay

## 2016-03-22 ENCOUNTER — Encounter (HOSPITAL_COMMUNITY)
Admission: RE | Admit: 2016-03-22 | Discharge: 2016-03-22 | Disposition: A | Discharge status: Home / Self Care | Payer: Medicare Other | Source: Ambulatory Visit | Attending: Internal Medicine | Admitting: Internal Medicine

## 2016-03-22 VITALS — BP 144/75 | HR 64 | Ht 67.0 in | Wt 128.7 lb

## 2016-03-22 DIAGNOSIS — I5021 Acute systolic (congestive) heart failure: Secondary | ICD-10-CM

## 2016-03-22 NOTE — Progress Notes (Addendum)
Bruce Travis 81 y.o. male Pulmonary Rehab Orientation Note Patient arrived today in Cardiac and Pulmonary Rehab for orientation to Pulmonary Rehab. He was transported from [employer] via wheel chair. He does carry portable oxygen. Per pt, he uses oxygen continuously. Color good, skin warm and dry. Patient is oriented to time and place. Patient's medical history, psychosocial health, and medications reviewed. Psychosocial assessment reveals pt lives with their spouse. Pt is currently retired. 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 Pt hobbies include reading. He dearly loved horse back riding, but gave up this activity years ago.  Pt reports his stress level is low. Areas of stress/anxiety include Health.  Pt does not exhibit signs of depression. PHQ2/9 score 0/0. Pt shows good  coping skills with positive outlook . Will continue to monitor and evaluate psychosocial status and if any concerns present themselves.  Physical assessment reveals heart rate is normal, breath sounds clear to auscultation, no wheezes, rales, or rhonchi. Grip strength equal, strong. Distal pulses 3+ bilateral posterior tibial pulses present. Patient reports he  does take medications as prescribed. Patient states he follows a gluten free diet. The patient reports no specific efforts to gain or lose weight.. Patient's weight will be monitored closely. Demonstration and practice of PLB using pulse oximeter. Patient able to return demonstration satisfactorily. Safety and hand hygiene in the exercise area reviewed with patient. Patient voices understanding of the information reviewed. Department expectations discussed with patient and achievable goals were set. The patient shows enthusiasm about attending the program and we look forward to working with this nice gentleman. The patient is scheduled for a 6 min walk test on Thursday, March 25, 2016 @ 3:345 pm and to begin exercise on Thursday, April 01, 2016 in the 1030 class.    [id number]

## 2016-03-24 ENCOUNTER — Encounter (HOSPITAL_COMMUNITY): Payer: Self-pay | Admitting: *Deleted

## 2016-03-25 ENCOUNTER — Encounter (HOSPITAL_COMMUNITY)
Admission: RE | Admit: 2016-03-25 | Discharge: 2016-03-25 | Disposition: A | Discharge status: Home / Self Care | Payer: Medicare Other | Source: Ambulatory Visit | Attending: Internal Medicine | Admitting: Internal Medicine

## 2016-03-25 DIAGNOSIS — I5021 Acute systolic (congestive) heart failure: Secondary | ICD-10-CM | POA: Diagnosis not present

## 2016-03-25 NOTE — Progress Notes (Signed)
Pulmonary Individual Treatment Plan  Patient Details  Name: Bruce Travis MRN: [id number] Date of Birth: Sep 17, 1934 Referring Provider:   April Manson Pulmonary Rehab Walk Test from 03/25/2016 in [place]  Referring Provider  Dr. Haroldine Laws      Initial Encounter Date:  Flowsheet Row Pulmonary Rehab Walk Test from 03/25/2016 in [place]  Date  03/25/16  Referring Provider  Dr. Haroldine Laws      Visit Diagnosis: Acute systolic heart failure ([place])  Patient's Home Medications on Admission:   Current Outpatient Prescriptions:  .  AMBULATORY NON FORMULARY MEDICATION, O2 2 Lpm continuous, Disp: , Rfl:  .  amiodarone (PACERONE) 200 MG tablet, Take 1 tablet (200 mg total) by mouth daily., Disp: 30 tablet, Rfl: 6 .  aspirin EC 81 MG tablet, Take 81 mg by mouth daily., Disp: , Rfl:  .  azelastine (ASTELIN) 0.1 % nasal spray, Place 2 sprays into both nostrils 2 (two) times daily as needed for rhinitis or allergies. , Disp: , Rfl:  .  montelukast (SINGULAIR) 10 MG tablet, TAKE 1 TABLET BY MOUTH DAILY., Disp: 90 tablet, Rfl: 3 .  Multiple Vitamin (MULTIVITAMIN WITH MINERALS) TABS tablet, Take 1 tablet by mouth daily., Disp: , Rfl:  .  pantoprazole (PROTONIX) 40 MG tablet, TAKE ONE TABLET BY MOUTH DAILY., Disp: 30 tablet, Rfl: 3 .  polyethylene glycol (MIRALAX / GLYCOLAX) packet, Take 17 g by mouth daily., Disp: , Rfl:  .  PROAIR HFA 108 (90 BASE) MCG/ACT inhaler, INHALE 2 PUFFS EVERY 4 HOURS AS NEEDED FOR SHORTNESS OF BREATH., Disp: 8.5 g, Rfl: 5 .  sacubitril-valsartan (ENTRESTO) 24-26 MG, Take 1 tablet by mouth 2 (two) times daily., Disp: 60 tablet, Rfl: 6 .  SPIRIVA HANDIHALER 18 MCG inhalation capsule, INHALE 1 CAPSULE ONCE DAILY AS DIRECTED, Disp: 30 capsule, Rfl: 11 .  spironolactone (ALDACTONE) 25 MG tablet, Take 0.5 tablets (12.5 mg total) by mouth daily., Disp: 16 tablet, Rfl: 6 .  SYMBICORT 160-4.5 MCG/ACT inhaler, INHALE 2 PUFFS  FIRST THING IN MORNING AND THEN ANOTHER 2 PUFFS ABOUT 12 HOURS LATER., Disp: 10.2 g, Rfl: 11  Past Medical History: Past Medical History:  Diagnosis Date  . Allergic rhinitis   . Bronchiectasis   . CAD (coronary artery disease)   . Celiac disease   . Chronic heart failure ([place])   . Chronic kidney disease (CKD), stage III (moderate)   . COPD (chronic obstructive pulmonary disease) ([place])   . Diverticulosis of colon (without mention of hemorrhage)   . Esophageal reflux   . Family history of adverse reaction to anesthesia    daughter gets PONV  . Family hx of colon cancer   . History of stomach ulcers 1980s  . Hyperlipemia   . Hypertension   . 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 Myocardial infarction [facility])    "previous MI/echo in 12/2015"  . Nephrolithiasis    "got them now; never had OR/scopes" (02/03/2016)  . On home oxygen therapy    "2L; 24/7" (02/03/2016)  . Other diseases of lung, not elsewhere classified   . Personal history of colonic polyps 04/26/2007   hyperplastic   . Pneumonia "several times"    Tobacco Use: History  Smoking Status  . Former Smoker  . Packs/day: 2.00  . Years: 35.00  . Types: Cigarettes  . Quit date: 01/21/1989  Smokeless Tobacco  . Former Systems developer  . Types: Chew  .  Quit date: 08/23/2001    Labs: Recent Review Flowsheet Data    Labs for ITP Cardiac and Pulmonary Rehab Latest Ref Rng & Units 02/03/2016 02/04/2016 02/05/2016 02/06/2016 03/09/2016   Cholestrol 125 - 200 mg/dL - - - - 208(H)   LDLCALC <130 mg/dL - - - - 132(H)   HDL >=40 mg/dL - - - - 33(L)   Trlycerides <150 mg/dL - - - - 217(H)   Hemoglobin A1c 4.8 - 5.6 % - - 5.6 - -   PHART 7.350 - 7.450 7.375 - - - -   PCO2ART 35.0 - 45.0 mmHg 37.8 - - - -   HCO3 20.0 - 24.0 mEq/L 22.1 - - - -   TCO2 0 - 100 mmol/L 23 - - - -   ACIDBASEDEF 0.0 - 2.0 mmol/L 3.0(H) - - - -   O2SAT % 91.0 67.0 80.1 72.0 -      Capillary Blood Glucose: No results found for:  GLUCAP   ADL UCSD:     Pulmonary Assessment Scores    Row Name 03/24/16 1519         ADL UCSD   ADL Phase Entry     SOB Score total 43        Pulmonary Function Assessment:     Pulmonary Function Assessment - 03/22/16 1036      Breath   Bilateral Breath Sounds Clear   Shortness of Breath Yes;Limiting activity      Exercise Target Goals: Date: 03/25/16  Exercise Program Goal: Individual exercise prescription set with THRR, safety & activity barriers. Participant demonstrates ability to understand and report RPE using BORG scale, to self-measure pulse accurately, and to acknowledge the importance of the exercise prescription.  Exercise Prescription Goal: Starting with aerobic activity 30 plus minutes a day, 3 days per week for initial exercise prescription. Provide home exercise prescription and guidelines that participant acknowledges understanding prior to discharge.  Activity Barriers & Risk Stratification:     Activity Barriers & Cardiac Risk Stratification - 03/22/16 1034      Activity Barriers & Cardiac Risk Stratification   Activity Barriers None      6 Minute Walk:     6 Minute Walk    Row Name 03/25/16 1633         6 Minute Walk   Phase Initial     Distance 1175 feet     Walk Time 6 minutes     # of Rest Breaks 0     MPH 2.22     METS 2.68     RPE 13     Perceived Dyspnea  3     Symptoms No     Resting HR 65 bpm     Resting BP 162/80     Max Ex. HR 78 bpm     Max Ex. BP 158/82       Interval Oxygen   Interval Oxygen? Yes     Baseline Oxygen Saturation % 65 %     Baseline Liters of Oxygen 2 L     1 Minute Oxygen Saturation % 93 %     1 Minute Liters of Oxygen 2 L     2 Minute Oxygen Saturation % 92 %     2 Minute Liters of Oxygen 2 L     3 Minute Oxygen Saturation % 88 %     3 Minute Liters of Oxygen 2 L     4 Minute Oxygen Saturation % 88 %  4 Minute Liters of Oxygen 2 L     5 Minute Oxygen Saturation % 87 %     5 Minute  Liters of Oxygen 3 L     6 Minute Oxygen Saturation % 86 %     6 Minute Liters of Oxygen 3 L     2 Minute Post Oxygen Saturation % 91 %     2 Minute Post Liters of Oxygen 3 L        Initial Exercise Prescription:     Initial Exercise Prescription - 03/25/16 1600      Date of Initial Exercise RX and Referring Provider   Date 03/25/16   Referring Provider Dr. Haroldine Laws     Oxygen   Oxygen Continuous   Liters 3     Bike   Level 0.5   Minutes 17     NuStep   Level 1   Minutes 17   METs 1.7     Track   Laps 6   Minutes 17     Prescription Details   Frequency (times per week) 2   Duration Progress to 45 minutes of aerobic exercise without signs/symptoms of physical distress     Intensity   THRR 40-80% of Max Heartrate 56-113   Ratings of Perceived Exertion 11-13   Perceived Dyspnea 0-4     Progression   Progression Continue progressive overload as per policy without signs/symptoms or physical distress.     Resistance Training   Training Prescription Yes   Weight orange    Reps 10-12      Perform Capillary Blood Glucose checks as needed.  Exercise Prescription Changes:   Exercise Comments:   Discharge Exercise Prescription (Final Exercise Prescription Changes):    Nutrition:  Target Goals: Understanding of nutrition guidelines, daily intake of sodium <1576m, cholesterol <2026m calories 30% from fat and 7% or less from saturated fats, daily to have 5 or more servings of fruits and vegetables.  Biometrics:     Pre Biometrics - 03/22/16 1039      Pre Biometrics   Grip Strength 33 kg       Nutrition Therapy Plan and Nutrition Goals:   Nutrition Discharge: Rate Your Plate Scores:   Psychosocial: Target Goals: Acknowledge presence or absence of depression, maximize coping skills, provide positive support system. Participant is able to verbalize types and ability to use techniques and skills needed for reducing stress and  depression.  Initial Review & Psychosocial Screening:     Initial Psych Review & Screening - 03/22/16 1042      Initial Review   Current issues with --  none at this point     [place]   Concerns --  none at this point     Barriers   Psychosocial barriers to participate in program There are no identifiable barriers or psychosocial needs.     Screening Interventions   Interventions Encouraged to exercise      Quality of Life Scores:     Quality of Life - 03/24/16 1519      Quality of Life Scores   Health/Function Pre 20.03 %   Socioeconomic Pre 22.31 %   Psych/Spiritual Pre 20.36 %   Family Pre 22.25 %   GLOBAL Pre 20.9 %      PHQ-9: Recent Review Flowsheet Data    [facility] 03/22[facility]2016 04/16/2015 10/22/2013   Decreased Interest 0 0 0   Down, Depressed, Hopeless 0 0  0   PHQ - 2 Score 0 0 0      Psychosocial Evaluation and Intervention:     Psychosocial Evaluation - 03/22/16 1043      Psychosocial Evaluation & Interventions   Interventions Encouraged to exercise with the program and follow exercise prescription   Continued Psychosocial Services Needed No      Psychosocial Re-Evaluation:  Education: Education Goals: Education classes will be provided on a weekly basis, covering required topics. Participant will state understanding/return demonstration of topics presented.  Learning Barriers/Preferences:     Learning Barriers/Preferences - 03/22/16 1035      Learning Barriers/Preferences   Learning Barriers None   Learning Preferences Group Instruction;Skilled Demonstration      Education Topics: Risk Factor Reduction:  -Group instruction that is supported by a PowerPoint presentation. Instructor discusses the definition of a risk factor, different risk factors for pulmonary disease, and how the heart and lungs work together.     Nutrition for Pulmonary Patient:  -Group instruction provided by  PowerPoint slides, verbal discussion, and written materials to support subject matter. The instructor gives an explanation and review of healthy diet recommendations, which includes a discussion on weight management, recommendations for fruit and vegetable consumption, as well as protein, fluid, caffeine, fiber, sodium, sugar, and alcohol. Tips for eating when patients are short of breath are discussed.   Pursed Lip Breathing:  -Group instruction that is supported by demonstration and informational handouts. Instructor discusses the benefits of pursed lip and diaphragmatic breathing and detailed demonstration on how to preform both.     Oxygen Safety:  -Group instruction provided by PowerPoint, verbal discussion, and written material to support subject matter. There is an overview of "What is Oxygen" and "Why do we need it".  Instructor also reviews how to create a safe environment for oxygen use, the importance of using oxygen as prescribed, and the risks of noncompliance. There is a brief discussion on traveling with oxygen and resources the patient may utilize.   Oxygen Equipment:  -Group instruction provided by [facility] Staff utilizing handouts, written materials, and equipment demonstrations.   Signs and Symptoms:  -Group instruction provided by written material and verbal discussion to support subject matter. Warning signs and symptoms of infection, stroke, and heart attack are reviewed and when to call the physician/911 reinforced. Tips for preventing the spread of infection discussed.   Advanced Directives:  -Group instruction provided by verbal instruction and written material to support subject matter. Instructor reviews Advanced Directive laws and proper instruction for filling out document.   Pulmonary Video:  -Group video education that reviews the importance of medication and oxygen compliance, exercise, good nutrition, pulmonary hygiene, and pursed lip and diaphragmatic  breathing for the pulmonary patient.   Exercise for the Pulmonary Patient:  -Group instruction that is supported by a PowerPoint presentation. Instructor discusses benefits of exercise, core components of exercise, frequency, duration, and intensity of an exercise routine, importance of utilizing pulse oximetry during exercise, safety while exercising, and options of places to exercise outside of rehab.     Pulmonary Medications:  -Verbally interactive group education provided by instructor with focus on inhaled medications and proper administration.   Anatomy and Physiology of the Respiratory System and Intimacy:  -Group instruction provided by PowerPoint, verbal discussion, and written material to support subject matter. Instructor reviews respiratory cycle and anatomical components of the respiratory system and their functions. Instructor also reviews differences in obstructive and restrictive respiratory diseases with examples of each. Intimacy, Sex,  and Sexuality differences are reviewed with a discussion on how relationships can change when diagnosed with pulmonary disease. Common sexual concerns are reviewed.   Knowledge Questionnaire Score:     Knowledge Questionnaire Score - 03/24/16 1518      Knowledge Questionnaire Score   Pre Score 9/13      Core Components/Risk Factors/Patient Goals at Admission:     Personal Goals and Risk Factors at Admission - 03/22/16 1039      Core Components/Risk Factors/Patient Goals on Admission   Increase Strength and Stamina Yes   Intervention Provide advice, education, support and counseling about physical activity/exercise needs.;Develop an individualized exercise prescription for aerobic and resistive training based on initial evaluation findings, risk stratification, comorbidities and participant's personal goals.   Expected Outcomes Achievement of increased cardiorespiratory fitness and enhanced flexibility, muscular endurance and strength  shown through measurements of functional capacity and personal statement of participant.   Improve shortness of breath with ADL's Yes   Intervention Provide education, individualized exercise plan and daily activity instruction to help decrease symptoms of SOB with activities of daily living.   Expected Outcomes Short Term: Achieves a reduction of symptoms when performing activities of daily living.      Core Components/Risk Factors/Patient Goals Review:      Goals and Risk Factor Review    Row Name 03/22/16 1041             Core Components/Risk Factors/Patient Goals Review   Personal Goals Review Increase Strength and Stamina;Improve shortness of breath with ADL's       Review Exercise will increase strength, stamina, and improve SOB       Expected Outcomes Exercise will increase strength, stamina, and improve SOB          Core Components/Risk Factors/Patient Goals at Discharge (Final Review):      Goals and Risk Factor Review - 03/22/16 1041      Core Components/Risk Factors/Patient Goals Review   Personal Goals Review Increase Strength and Stamina;Improve shortness of breath with ADL's   Review Exercise will increase strength, stamina, and improve SOB   Expected Outcomes Exercise will increase strength, stamina, and improve SOB      ITP Comments:   Comments:

## 2016-04-01 ENCOUNTER — Encounter (HOSPITAL_COMMUNITY)
Admission: RE | Admit: 2016-04-01 | Discharge: 2016-04-01 | Disposition: A | Discharge status: Home / Self Care | Payer: Medicare Other | Source: Ambulatory Visit | Attending: Internal Medicine | Admitting: Internal Medicine

## 2016-04-01 VITALS — Wt 128.7 lb

## 2016-04-01 DIAGNOSIS — I5021 Acute systolic (congestive) heart failure: Secondary | ICD-10-CM

## 2016-04-01 NOTE — Progress Notes (Signed)
Daily Session Note  Patient Details  Name: Bruce Travis MRN: [id number] Date of Birth: [birth year] Referring Provider:   April Manson Pulmonary Rehab Walk Test from 03/25/2016 in [place]  Referring Provider  Dr. Haroldine Laws      Encounter Date: 04/01/2016  Check In:     Session Check In - 04/01/16 1023      Check-In   Location [facility]   Staff Present Su Hilt, MS, ACSM RCEP, Exercise Physiologist;Joan Leonia Reeves, RN, Roque Cash, RN;Other   Supervising physician immediately available to respond to emergencies [facility] immediately available   Physician(s) Dr. Marthenia Rolling   Medication changes reported     No   Fall or balance concerns reported    No   Warm-up and Cool-down Performed as group-led instruction   Resistance Training Performed Yes   VAD Patient? No     Pain Assessment   Currently in Pain? No/denies   Multiple Pain Sites No      Capillary Blood Glucose: No results found for this or any previous visit (from the past 24 hour(s)).      Exercise Prescription Changes - 04/01/16 1200      Response to Exercise   Blood Pressure (Admit) 146/84   Blood Pressure (Exercise) 150/70   Blood Pressure (Exit) 140/66   Heart Rate (Admit) 66 bpm   Heart Rate (Exercise) 89 bpm   Heart Rate (Exit) 67 bpm   Oxygen Saturation (Admit) 94 %   Oxygen Saturation (Exercise) 89 %   Oxygen Saturation (Exit) 95 %   Rating of Perceived Exertion (Exercise) 13   Perceived Dyspnea (Exercise) 1   Duration Progress to 45 minutes of aerobic exercise without signs/symptoms of physical distress   Intensity THRR unchanged     Progression   Progression Continue progressive overload as per policy without signs/symptoms or physical distress.     Resistance Training   Training Prescription Yes   Weight orange bands   Reps 10-12     Interval Training   Interval Training No     Oxygen   Oxygen Continuous   Liters 3     Bike    Level 0.5   Minutes 17     Track   Laps 4   Minutes 17     Goals Met:  Exercise tolerated well No report of cardiac concerns or symptoms Strength training completed today  Goals Unmet:  Not Applicable  Comments: Service time is from 10:30am to 12:40pm    Dr. Rush Farmer is Medical Director for Pulmonary Rehab at [facility].

## 2016-04-02 ENCOUNTER — Encounter (HOSPITAL_COMMUNITY): Payer: Medicare Other | Admitting: Internal Medicine

## 2016-04-02 ENCOUNTER — Ambulatory Visit (HOSPITAL_COMMUNITY)
Admission: RE | Admit: 2016-04-02 | Discharge: 2016-04-02 | Disposition: A | Discharge status: Home / Self Care | Payer: Medicare Other | Source: Ambulatory Visit | Attending: Internal Medicine | Admitting: Internal Medicine

## 2016-04-02 VITALS — BP 140/76 | HR 68 | Wt 129.0 lb

## 2016-04-02 DIAGNOSIS — J9611 Chronic respiratory failure with hypoxia: Secondary | ICD-10-CM

## 2016-04-02 DIAGNOSIS — I5042 Chronic combined systolic (congestive) and diastolic (congestive) heart failure: Secondary | ICD-10-CM | POA: Diagnosis not present

## 2016-04-02 DIAGNOSIS — I493 Ventricular premature depolarization: Secondary | ICD-10-CM | POA: Diagnosis not present

## 2016-04-02 DIAGNOSIS — I509 Heart failure, unspecified: Secondary | ICD-10-CM | POA: Insufficient documentation

## 2016-04-02 DIAGNOSIS — Z79899 Other long term (current) drug therapy: Secondary | ICD-10-CM | POA: Diagnosis not present

## 2016-04-02 DIAGNOSIS — I10 Essential (primary) hypertension: Secondary | ICD-10-CM

## 2016-04-02 LAB — BASIC METABOLIC PANEL
Anion gap: 8 (ref 5–15)
BUN: 22 mg/dL — ABNORMAL HIGH (ref 6–20)
CALCIUM: 9.6 mg/dL (ref 8.9–10.3)
CO2: 24 mmol/L (ref 22–32)
CREATININE: 1.3 mg/dL — AB (ref 0.61–1.24)
Chloride: 106 mmol/L (ref 101–111)
GFR calc non Af Amer: 51 mL/min — ABNORMAL LOW (ref >60)
GFR, EST AFRICAN AMERICAN: 59 mL/min — AB (ref >60)
Glucose, Bld: 84 mg/dL (ref 65–99)
Potassium: 4.7 mmol/L (ref 3.5–5.1)
Sodium: 138 mmol/L (ref 135–145)

## 2016-04-02 MED ORDER — SACUBITRIL-VALSARTAN 49-51 MG PO TABS: 1.0000 | ORAL_TABLET | Freq: Two times a day (BID) | ORAL | 6 refills | Status: DC

## 2016-04-02 NOTE — Progress Notes (Signed)
Patient ID: Bruce Travis, male   DOB: April 10, 1935, 81 y.o.   MRN: [id number]    OFFICE NOTE  Chief Complaint:  PAC's and PVC's, abnormal ekg, DOE  Primary Care Physician: Odette Fraction, MD  HPI:  Bruce Travis is a 81 y.o. male with COPD (O2 dependent). HTN, HL and systolic HF fleto to be related to frequent PVCs.   Admitted in June 2017 with acute HF. Echo with EF 25%. Cath twith essentially normal coronaries. EF 20-25%. Low filling pressures but CO 2.5/1.5. Started on milrinone. 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 Started on amiodarone. PVCs went from ~20/minute to 1-2/minute by time of discharge. Milrinone weaned off.   He presents today for regular follow up. Feeling good overall. Thinks entresto is a large part of it.   Started pulmonary rehab here at [facility]. Little sore today from that. Weight at home 125-127. Denies lightheadedness or dizziness. Denies edema or PND. Sleeps on 2 pillows chronically for comfort.  Wife and patient both think his energy is getting better. Planted a turnip patch last week. Walked into clinic today without difficulty.   PMHx:  Past Medical History:  Diagnosis Date  . Allergic rhinitis   . Bronchiectasis   . CAD (coronary artery disease)   . Celiac disease   . Chronic heart failure ([place])   . Chronic kidney disease (CKD), stage III (moderate)   . COPD (chronic obstructive pulmonary disease) ([place])   . Diverticulosis of colon (without mention of hemorrhage)   . Esophageal reflux   . Family history of adverse reaction to anesthesia    daughter gets PONV  . Family hx of colon cancer   . History of stomach ulcers 1980s  . Hyperlipemia   . Hypertension   . 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 Myocardial infarction [facility])    "previous MI/echo in 12/2015"  . Nephrolithiasis    "got them now; never had OR/scopes" (02/03/2016)  . On home oxygen therapy    "2L; 24/7" (02/03/2016)  . Other diseases of lung, not elsewhere classified     . Personal history of colonic polyps 04/26/2007   hyperplastic   . Pneumonia "several times"    Past Surgical History:  Procedure Laterality Date  . CARDIAC CATHETERIZATION  02/03/2016  . CARDIAC CATHETERIZATION  09/30/2009   non-obstructive CAD w/30% narrowing in prox LAD (Dr. Waunita Schooner)  . CARDIAC CATHETERIZATION  05/04/2001   same at 2011 cath (Dr. Waunita Schooner)  . CARDIAC CATHETERIZATION N/A 02/03/2016   Procedure: Right/Left Heart Cath and Coronary Angiography;  Surgeon: Pixie Casino, MD;  Location: [place] [facility];  Service: Cardiovascular;  Laterality: N/A;  . EPIGLOTOPLASTY W/ MLB     removal due to carcinoma  . EXCISIONAL HEMORRHOIDECTOMY  2000s  . HAND SURGERY Right 1958   d/t crush injury  . TONSILLECTOMY AND ADENOIDECTOMY    . ULTRASOUND GUIDANCE FOR VASCULAR ACCESS  02/03/2016   Procedure: Ultrasound Guidance For Vascular Access;  Surgeon: Pixie Casino, MD;  Location: [place] [facility];  Service: Cardiovascular;;  . VASECTOMY      FAMHx:  Family History  Problem Relation Age of Onset  . Heart disease Father   . Colon cancer Father   . Prostate cancer Father   . Stroke Mother   . Endometrial cancer Sister   . Stroke Maternal Grandmother   . Cancer Paternal Grandmother     SOCHx:   reports that he quit smoking  about 27 years ago. His smoking use included Cigarettes. He has a 70.00 pack-year smoking history. He quit smokeless tobacco use about 14 years ago. His smokeless tobacco use included Chew. He reports that he does not drink alcohol or use drugs.  ALLERGIES:  Allergies  Allergen Reactions  . Adhesive [Tape] Other (See Comments)    Band-aids - tears skin off  . Codeine Other (See Comments)    Thought head was going to blow off  . Gluten Meal Other (See Comments)    Celiac disease    ROS: Pertinent items noted in HPI and remainder of comprehensive ROS otherwise negative.  HOME MEDS: Current Outpatient Prescriptions  Medication Sig Dispense  Refill  . AMBULATORY NON FORMULARY MEDICATION O2 2 Lpm continuous    . amiodarone (PACERONE) 200 MG tablet Take 1 tablet (200 mg total) by mouth daily. 30 tablet 6  . aspirin EC 81 MG tablet Take 81 mg by mouth daily.    Marland Kitchen azelastine (ASTELIN) 0.1 % nasal spray Place 2 sprays into both nostrils 2 (two) times daily as needed for rhinitis or allergies.     . montelukast (SINGULAIR) 10 MG tablet TAKE 1 TABLET BY MOUTH DAILY. 90 tablet 3  . Multiple Vitamin (MULTIVITAMIN WITH MINERALS) TABS tablet Take 1 tablet by mouth daily.    . pantoprazole (PROTONIX) 40 MG tablet TAKE ONE TABLET BY MOUTH DAILY. 30 tablet 3  . polyethylene glycol (MIRALAX / GLYCOLAX) packet Take 17 g by mouth daily as needed for mild constipation.     Marland Kitchen PROAIR HFA 108 (90 BASE) MCG/ACT inhaler INHALE 2 PUFFS EVERY 4 HOURS AS NEEDED FOR SHORTNESS OF BREATH. 8.5 g 5  . sacubitril-valsartan (ENTRESTO) 24-26 MG Take 1 tablet by mouth 2 (two) times daily. 60 tablet 6  . SPIRIVA HANDIHALER 18 MCG inhalation capsule INHALE 1 CAPSULE ONCE DAILY AS DIRECTED 30 capsule 11  . spironolactone (ALDACTONE) 25 MG tablet Take 0.5 tablets (12.5 mg total) by mouth daily. 16 tablet 6  . SYMBICORT 160-4.5 MCG/ACT inhaler INHALE 2 PUFFS FIRST THING IN MORNING AND THEN ANOTHER 2 PUFFS ABOUT 12 HOURS LATER. 10.2 g 11   No current facility-administered medications for this encounter.     LABS/IMAGING: No results found for this or any previous visit (from the past 48 hour(s)). No results found.  WEIGHTS: Wt Readings from Last 3 Encounters:  04/02/16 129 lb (58.5 kg)  04/01/16 128 lb 12 oz (58.4 kg)  03/22/16 128 lb 12 oz (58.4 kg)    VITALS: BP 140/76 (BP Location: Left Arm, Patient Position: Sitting, Cuff Size: Normal)   Pulse 68   Wt 129 lb (58.5 kg)   SpO2 (!) 87% Comment: ON 2L  BMI 20.20 kg/m   Wt Readings from Last 3 Encounters:  04/02/16 129 lb (58.5 kg)  04/01/16 128 lb 12 oz (58.4 kg)  03/22/16 128 lb 12 oz (58.4 kg)      EXAM: General:  Elderly NAD. Wearing O2. No resp difficulty HEENT: normal Neck: supple. no JVD. Carotids 2+ bilat; no bruits. No thyromegaly or nodule noted.  Cor: PMI nonpalpable. Distant heart sounds. Regular, no obvious ectopic beats. Lungs: Mildly diminished basilar sounds, Clear.  Abdomen: soft, NT, ND, no HSM. No bruits or masses. +BS  Extremities: no cyanosis, clubbing, rash. No peripheral edema Neuro: alert & orientedx3, cranial nerves grossly intact. moves all 4 extremities w/o difficulty. Affect pleasant  EKG: NSR 60 RBBB  No PVCs  ASSESSMENT: 1. 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 PVC related cardiomyopathy 3. Chronic respiratory failure due to COPD on home O2 4. HTN  NYHA II-III now but mainly due to lung disease. PVCs suppressed. Volume status stable on exam.    Continue amiodarone to 200 mg daily for now.   Increase entresto to 49/51 No b-blocker yet. BMET today.   Repeat Echo in next few weeks. Follow up 2 months.   Satira Mccallum Tillery PA-C 04/02/2016, 12:11 PM   Patient seen and examined with Oda Kilts, PA-C. We discussed all aspects of the encounter. I agree with the assessment and plan as stated above.   He is doing very well. LV function now back to normal with medical therapy and suppression foPVCs. Will continue amio. Titrate Entresto as BP up. Repeat echo. Labs today.  Danyetta Gillham,MD 12:03 AM

## 2016-04-02 NOTE — Progress Notes (Signed)
Advanced Heart Failure Medication Review by a Pharmacist  Does the patient  feel that his/her medications are working for him/her?  yes  Has the patient been experiencing any side effects to the medications prescribed?  no  Does the patient measure his/her own blood pressure or blood glucose at home?  no   Does the patient have any problems obtaining medications due to transportation or finances?   no  Understanding of regimen: good Understanding of indications: good Potential of compliance: excellent Patient understands to avoid NSAIDs. Patient understands to avoid decongestants.  Issues to address at subsequent visits: None   Pharmacist comments:  Bruce Travis is a pleasant 81 yo M presenting with his wife and a medication list on his phone. He reports good compliance with his regimen. He did not have any specific medication-related questions or concerns for me at this time.   [name]. [name], PharmD, BCPS, CPP Clinical Pharmacist Pager: [phone] Phone: [phone] 04/02/2016 12:11 PM      Time with patient: 10 minutes Preparation and documentation time: 2 minutes Total time: 12 minutes

## 2016-04-02 NOTE — Patient Instructions (Signed)
INCREASE Entresto to 49/51 mg tablet twice daily (10-12 hrs apart).  Routine lab work today. Will notify you of abnormal results, otherwise no news is good news!  Return in 2 weeks for echo and lab appointment.  Follow up 3 months with Dr. Haroldine Laws.  Do the following things EVERYDAY: 1) Weigh yourself in the morning before breakfast. Write it down and keep it in a log. 2) Take your medicines as prescribed 3) Eat low salt foods-Limit salt (sodium) to 2000 mg per day.  4) Stay as active as you can everyday 5) Limit all fluids for the day to less than 2 liters

## 2016-04-06 ENCOUNTER — Encounter (HOSPITAL_COMMUNITY)
Admission: RE | Admit: 2016-04-06 | Discharge: 2016-04-06 | Disposition: A | Discharge status: Home / Self Care | Payer: Medicare Other | Source: Ambulatory Visit | Attending: Internal Medicine | Admitting: Internal Medicine

## 2016-04-06 VITALS — Wt 129.4 lb

## 2016-04-06 DIAGNOSIS — I5021 Acute systolic (congestive) heart failure: Secondary | ICD-10-CM

## 2016-04-06 NOTE — Progress Notes (Signed)
Daily Session Note  Patient Details  Name: Bruce Travis MRN: [id number] Date of Birth: 05/07/1935 Referring Provider:   April Manson Pulmonary Rehab Walk Test from 03/25/2016 in [place]  Referring Provider  Dr. Haroldine Laws      Encounter Date: 04/06/2016  Check In:     Session Check In - 04/06/16 1055      Check-In   Medication changes reported     Yes   Comments Entresto increased to 49/51 bid      Capillary Blood Glucose: No results found for this or any previous visit (from the past 24 hour(s)).      Exercise Prescription Changes - 04/06/16 1200      Response to Exercise   Blood Pressure (Admit) 122/60   Blood Pressure (Exercise) 142/90   Blood Pressure (Exit) 118/60   Heart Rate (Admit) 65 bpm   Heart Rate (Exercise) 69 bpm   Heart Rate (Exit) 71 bpm   Oxygen Saturation (Admit) 93 %   Oxygen Saturation (Exercise) 88 %   Oxygen Saturation (Exit) 94 %   Rating of Perceived Exertion (Exercise) 13   Perceived Dyspnea (Exercise) 2   Duration Progress to 45 minutes of aerobic exercise without signs/symptoms of physical distress   Intensity THRR unchanged     Progression   Progression Continue progressive overload as per policy without signs/symptoms or physical distress.     Resistance Training   Training Prescription Yes   Weight orange bands   Reps 10-12     Interval Training   Interval Training No     Oxygen   Oxygen Continuous   Liters 3     Bike   Level 2   Minutes 17     NuStep   Level 1   Minutes 17   METs 2.8     Track   Laps 12   Minutes 17     Goals Met:  Exercise tolerated well No report of cardiac concerns or symptoms Strength training completed today  Goals Unmet:  Not Applicable  Comments: Service time is from 10:30am to 12:10pm    Dr. Rush Farmer is Medical Director for Pulmonary Rehab at [facility].

## 2016-04-06 NOTE — Progress Notes (Signed)
Pulmonary Individual Treatment Plan  Patient Details  Name: Bruce Travis MRN: [id number] Date of Birth: Jan 06, 1935 Referring Provider:   April Manson Pulmonary Rehab Walk Test from 03/25/2016 in [place]  Referring Provider  Dr. Haroldine Laws      Initial Encounter Date:  Flowsheet Row Pulmonary Rehab Walk Test from 03/25/2016 in [place]  Date  03/25/16  Referring Provider  Dr. Haroldine Laws      Visit Diagnosis: Acute systolic heart failure ([place])  Patient's Home Medications on Admission:   Current Outpatient Prescriptions:  .  AMBULATORY NON FORMULARY MEDICATION, O2 2 Lpm continuous, Disp: , Rfl:  .  amiodarone (PACERONE) 200 MG tablet, Take 1 tablet (200 mg total) by mouth daily., Disp: 30 tablet, Rfl: 6 .  aspirin EC 81 MG tablet, Take 81 mg by mouth daily., Disp: , Rfl:  .  azelastine (ASTELIN) 0.1 % nasal spray, Place 2 sprays into both nostrils 2 (two) times daily as needed for rhinitis or allergies. , Disp: , Rfl:  .  montelukast (SINGULAIR) 10 MG tablet, TAKE 1 TABLET BY MOUTH DAILY., Disp: 90 tablet, Rfl: 3 .  Multiple Vitamin (MULTIVITAMIN WITH MINERALS) TABS tablet, Take 1 tablet by mouth daily., Disp: , Rfl:  .  pantoprazole (PROTONIX) 40 MG tablet, TAKE ONE TABLET BY MOUTH DAILY., Disp: 30 tablet, Rfl: 3 .  polyethylene glycol (MIRALAX / GLYCOLAX) packet, Take 17 g by mouth daily as needed for mild constipation. , Disp: , Rfl:  .  PROAIR HFA 108 (90 BASE) MCG/ACT inhaler, INHALE 2 PUFFS EVERY 4 HOURS AS NEEDED FOR SHORTNESS OF BREATH., Disp: 8.5 g, Rfl: 5 .  sacubitril-valsartan (ENTRESTO) 49-51 MG, Take 1 tablet by mouth 2 (two) times daily., Disp: 60 tablet, Rfl: 6 .  SPIRIVA HANDIHALER 18 MCG inhalation capsule, INHALE 1 CAPSULE ONCE DAILY AS DIRECTED, Disp: 30 capsule, Rfl: 11 .  spironolactone (ALDACTONE) 25 MG tablet, Take 0.5 tablets (12.5 mg total) by mouth daily., Disp: 16 tablet, Rfl: 6 .  SYMBICORT  160-4.5 MCG/ACT inhaler, INHALE 2 PUFFS FIRST THING IN MORNING AND THEN ANOTHER 2 PUFFS ABOUT 12 HOURS LATER., Disp: 10.2 g, Rfl: 11  Past Medical History: Past Medical History:  Diagnosis Date  . Allergic rhinitis   . Bronchiectasis   . CAD (coronary artery disease)   . Celiac disease   . Chronic heart failure ([place])   . Chronic kidney disease (CKD), stage III (moderate)   . COPD (chronic obstructive pulmonary disease) ([place])   . Diverticulosis of colon (without mention of hemorrhage)   . Esophageal reflux   . Family history of adverse reaction to anesthesia    daughter gets PONV  . Family hx of colon cancer   . History of stomach ulcers 1980s  . Hyperlipemia   . Hypertension   . 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 Myocardial infarction [facility])    "previous MI/echo in 12/2015"  . Nephrolithiasis    "got them now; never had OR/scopes" (02/03/2016)  . On home oxygen therapy    "2L; 24/7" (02/03/2016)  . Other diseases of lung, not elsewhere classified   . Personal history of colonic polyps 04/26/2007   hyperplastic   . Pneumonia "several times"    Tobacco Use: History  Smoking Status  . Former Smoker  . Packs/day: 2.00  . Years: 35.00  . Types: Cigarettes  . Quit date: 01/21/1989  Smokeless Tobacco  . Former Systems developer  .  Types: Chew  . Quit date: 08/23/2001    Labs: Recent Review Flowsheet Data    Labs for ITP Cardiac and Pulmonary Rehab Latest Ref Rng & Units 02/03/2016 02/04/2016 02/05/2016 02/06/2016 03/09/2016   Cholestrol 125 - 200 mg/dL - - - - 208(H)   LDLCALC <130 mg/dL - - - - 132(H)   HDL >=40 mg/dL - - - - 33(L)   Trlycerides <150 mg/dL - - - - 217(H)   Hemoglobin A1c 4.8 - 5.6 % - - 5.6 - -   PHART 7.350 - 7.450 7.375 - - - -   PCO2ART 35.0 - 45.0 mmHg 37.8 - - - -   HCO3 20.0 - 24.0 mEq/L 22.1 - - - -   TCO2 0 - 100 mmol/L 23 - - - -   ACIDBASEDEF 0.0 - 2.0 mmol/L 3.0(H) - - - -   O2SAT % 91.0 67.0 80.1 72.0 -      Capillary Blood  Glucose: No results found for: GLUCAP   ADL UCSD:     Pulmonary Assessment Scores    Row Name 03/24/16 1519         ADL UCSD   ADL Phase Entry     SOB Score total 43        Pulmonary Function Assessment:     Pulmonary Function Assessment - 03/22/16 1036      Breath   Bilateral Breath Sounds Clear   Shortness of Breath Yes;Limiting activity      Exercise Target Goals:    Exercise Program Goal: Individual exercise prescription set with THRR, safety & activity barriers. Participant demonstrates ability to understand and report RPE using BORG scale, to self-measure Travis accurately, and to acknowledge the importance of the exercise prescription.  Exercise Prescription Goal: Starting with aerobic activity 30 plus minutes a day, 3 days per week for initial exercise prescription. Provide home exercise prescription and guidelines that participant acknowledges understanding prior to discharge.  Activity Barriers & Risk Stratification:     Activity Barriers & Cardiac Risk Stratification - 03/22/16 1034      Activity Barriers & Cardiac Risk Stratification   Activity Barriers None      6 Minute Walk:     6 Minute Walk    Row Name 03/25/16 1633         6 Minute Walk   Phase Initial     Distance 1175 feet     Walk Time 6 minutes     # of Rest Breaks 0     MPH 2.22     METS 2.68     RPE 13     Perceived Dyspnea  3     Symptoms No     Resting HR 65 bpm     Resting BP 162/80     Max Ex. HR 78 bpm     Max Ex. BP 158/82       Interval Oxygen   Interval Oxygen? Yes     Baseline Oxygen Saturation % 65 %     Baseline Liters of Oxygen 2 L     1 Minute Oxygen Saturation % 93 %     1 Minute Liters of Oxygen 2 L     2 Minute Oxygen Saturation % 92 %     2 Minute Liters of Oxygen 2 L     3 Minute Oxygen Saturation % 88 %     3 Minute Liters of Oxygen 2 L     4 Minute Oxygen  Saturation % 88 %     4 Minute Liters of Oxygen 2 L     5 Minute Oxygen Saturation % 87  %     5 Minute Liters of Oxygen 3 L     6 Minute Oxygen Saturation % 86 %     6 Minute Liters of Oxygen 3 L     2 Minute Post Oxygen Saturation % 91 %     2 Minute Post Liters of Oxygen 3 L        Initial Exercise Prescription:     Initial Exercise Prescription - 03/25/16 1600      Date of Initial Exercise RX and Referring Provider   Date 03/25/16   Referring Provider Dr. Haroldine Laws     Oxygen   Oxygen Continuous   Liters 3     Bike   Level 0.5   Minutes 17     NuStep   Level 1   Minutes 17   METs 1.7     Track   Laps 6   Minutes 17     Prescription Details   Frequency (times per week) 2   Duration Progress to 45 minutes of aerobic exercise without signs/symptoms of physical distress     Intensity   THRR 40-80% of Max Heartrate 56-113   Ratings of Perceived Exertion 11-13   Perceived Dyspnea 0-4     Progression   Progression Continue progressive overload as per policy without signs/symptoms or physical distress.     Resistance Training   Training Prescription Yes   Weight orange    Reps 10-12      Perform Capillary Blood Glucose checks as needed.  Exercise Prescription Changes:     Exercise Prescription Changes    Row Name 04/01/16 1200             Response to Exercise   Blood Pressure (Admit) 146/84       Blood Pressure (Exercise) 150/70       Blood Pressure (Exit) 140/66       Heart Rate (Admit) 66 bpm       Heart Rate (Exercise) 89 bpm       Heart Rate (Exit) 67 bpm       Oxygen Saturation (Admit) 94 %       Oxygen Saturation (Exercise) 89 %       Oxygen Saturation (Exit) 95 %       Rating of Perceived Exertion (Exercise) 13       Perceived Dyspnea (Exercise) 1       Duration Progress to 45 minutes of aerobic exercise without signs/symptoms of physical distress       Intensity THRR unchanged         Progression   Progression Continue progressive overload as per policy without signs/symptoms or physical distress.         Resistance  Training   Training Prescription Yes       Weight orange bands       Reps 10-12         Interval Training   Interval Training No         Oxygen   Oxygen Continuous       Liters 3         Bike   Level 0.5       Minutes [place] 4       Minutes 17  Exercise Comments:     Exercise Comments    Row Name 04/06/16 [phone]           Exercise Comments Patient has only attended one exercise session. Will cont. to monitor.           Discharge Exercise Prescription (Final Exercise Prescription Changes):     Exercise Prescription Changes - 04/01/16 1200      Response to Exercise   Blood Pressure (Admit) 146/84   Blood Pressure (Exercise) 150/70   Blood Pressure (Exit) 140/66   Heart Rate (Admit) 66 bpm   Heart Rate (Exercise) 89 bpm   Heart Rate (Exit) 67 bpm   Oxygen Saturation (Admit) 94 %   Oxygen Saturation (Exercise) 89 %   Oxygen Saturation (Exit) 95 %   Rating of Perceived Exertion (Exercise) 13   Perceived Dyspnea (Exercise) 1   Duration Progress to 45 minutes of aerobic exercise without signs/symptoms of physical distress   Intensity THRR unchanged     Progression   Progression Continue progressive overload as per policy without signs/symptoms or physical distress.     Resistance Training   Training Prescription Yes   Weight orange bands   Reps 10-12     Interval Training   Interval Training No     Oxygen   Oxygen Continuous   Liters 3     Bike   Level 0.5   Minutes 17     Track   Laps 4   Minutes 17       Nutrition:  Target Goals: Understanding of nutrition guidelines, daily intake of sodium <1535m, cholesterol <2038m calories 30% from fat and 7% or less from saturated fats, daily to have 5 or more servings of fruits and vegetables.  Biometrics:     Pre Biometrics - 03/22/16 1039      Pre Biometrics   Grip Strength 33 kg       Nutrition Therapy Plan and Nutrition Goals:   Nutrition Discharge: Rate  Your Plate Scores:   Psychosocial: Target Goals: Acknowledge presence or absence of depression, maximize coping skills, provide positive support system. Participant is able to verbalize types and ability to use techniques and skills needed for reducing stress and depression.  Initial Review & Psychosocial Screening:     Initial Psych Review & Screening - 03/22/16 1042      Initial Review   Current issues with --  none at this point     [place]   Concerns --  none at this point     Barriers   Psychosocial barriers to participate in program There are no identifiable barriers or psychosocial needs.     Screening Interventions   Interventions Encouraged to exercise      Quality of Life Scores:     Quality of Life - 03/24/16 1519      Quality of Life Scores   Health/Function Pre 20.03 %   Socioeconomic Pre 22.31 %   Psych/Spiritual Pre 20.36 %   Family Pre 22.25 %   GLOBAL Pre 20.9 %      PHQ-9: Recent Review Flowsheet Data    [facility] 03/22/2016 04/16/2015 10/22/2013   Decreased Interest 0 0 0   Down, Depressed, Hopeless 0 0 0   PHQ - 2 Score 0 0 0      Psychosocial Evaluation and Intervention:     Psychosocial Evaluation - 03/22/16 1043      Psychosocial Evaluation & Interventions  Interventions Encouraged to exercise with the program and follow exercise prescription   Continued Psychosocial Services Needed No      Psychosocial Re-Evaluation:     Psychosocial Re-Evaluation    [place] Name 04/05/16 0910             Psychosocial Re-Evaluation   Interventions Encouraged to attend Pulmonary Rehabilitation for the exercise       Comments no psycosocial barriers identified at this time         Education: Education Goals: Education classes will be provided on a weekly basis, covering required topics. Participant will state understanding/return demonstration of topics presented.  Learning  Barriers/Preferences:     Learning Barriers/Preferences - 03/22/16 1035      Learning Barriers/Preferences   Learning Barriers None   Learning Preferences Group Instruction;Skilled Demonstration      Education Topics: Risk Factor Reduction:  -Group instruction that is supported by a PowerPoint presentation. Instructor discusses the definition of a risk factor, different risk factors for pulmonary disease, and how the heart and lungs work together.     Nutrition for Pulmonary Patient:  -Group instruction provided by PowerPoint slides, verbal discussion, and written materials to support subject matter. The instructor gives an explanation and review of healthy diet recommendations, which includes a discussion on weight management, recommendations for fruit and vegetable consumption, as well as protein, fluid, caffeine, fiber, sodium, sugar, and alcohol. Tips for eating when patients are short of breath are discussed. Flowsheet Row PULMONARY REHAB OTHER RESPIRATORY from 04/01/2016 in [place]  Date  04/01/16  Educator  edna  Instruction Review Code  2- meets goals/outcomes      Pursed Lip Breathing:  -Group instruction that is supported by demonstration and informational handouts. Instructor discusses the benefits of pursed lip and diaphragmatic breathing and detailed demonstration on how to preform both.     Oxygen Safety:  -Group instruction provided by PowerPoint, verbal discussion, and written material to support subject matter. There is an overview of "What is Oxygen" and "Why do we need it".  Instructor also reviews how to create a safe environment for oxygen use, the importance of using oxygen as prescribed, and the risks of noncompliance. There is a brief discussion on traveling with oxygen and resources the patient may utilize.   Oxygen Equipment:  -Group instruction provided by [facility] Staff utilizing handouts, written materials, and  equipment demonstrations.   Signs and Symptoms:  -Group instruction provided by written material and verbal discussion to support subject matter. Warning signs and symptoms of infection, stroke, and heart attack are reviewed and when to call the physician/911 reinforced. Tips for preventing the spread of infection discussed.   Advanced Directives:  -Group instruction provided by verbal instruction and written material to support subject matter. Instructor reviews Advanced Directive laws and proper instruction for filling out document.   Pulmonary Video:  -Group video education that reviews the importance of medication and oxygen compliance, exercise, good nutrition, pulmonary hygiene, and pursed lip and diaphragmatic breathing for the pulmonary patient.   Exercise for the Pulmonary Patient:  -Group instruction that is supported by a PowerPoint presentation. Instructor discusses benefits of exercise, core components of exercise, frequency, duration, and intensity of an exercise routine, importance of utilizing Travis oximetry during exercise, safety while exercising, and options of places to exercise outside of rehab.     Pulmonary Medications:  -Verbally interactive group education provided by instructor with focus on inhaled medications and proper administration.  Anatomy and Physiology of the Respiratory System and Intimacy:  -Group instruction provided by PowerPoint, verbal discussion, and written material to support subject matter. Instructor reviews respiratory cycle and anatomical components of the respiratory system and their functions. Instructor also reviews differences in obstructive and restrictive respiratory diseases with examples of each. Intimacy, Sex, and Sexuality differences are reviewed with a discussion on how relationships can change when diagnosed with pulmonary disease. Common sexual concerns are reviewed.   Knowledge Questionnaire Score:     Knowledge  Questionnaire Score - 03/24/16 1518      Knowledge Questionnaire Score   Pre Score 9/13      Core Components/Risk Factors/Patient Goals at Admission:     Personal Goals and Risk Factors at Admission - 03/22/16 1039      Core Components/Risk Factors/Patient Goals on Admission   Increase Strength and Stamina Yes   Intervention Provide advice, education, support and counseling about physical activity/exercise needs.;Develop an individualized exercise prescription for aerobic and resistive training based on initial evaluation findings, risk stratification, comorbidities and participant's personal goals.   Expected Outcomes Achievement of increased cardiorespiratory fitness and enhanced flexibility, muscular endurance and strength shown through measurements of functional capacity and personal statement of participant.   Improve shortness of breath with ADL's Yes   Intervention Provide education, individualized exercise plan and daily activity instruction to help decrease symptoms of SOB with activities of daily living.   Expected Outcomes Short Term: Achieves a reduction of symptoms when performing activities of daily living.      Core Components/Risk Factors/Patient Goals Review:      Goals and Risk Factor Review    Row Name 03/22/16 1041 04/05/16 0910           Core Components/Risk Factors/Patient Goals Review   Personal Goals Review Increase Strength and Stamina;Improve shortness of breath with ADL's Increase Strength and Stamina;Improve shortness of breath with ADL's      Review Exercise will increase strength, stamina, and improve SOB Exercise will increase strength, stamina, and improve SOB      Expected Outcomes Exercise will increase strength, stamina, and improve SOB Exercise will increase strength, stamina, and improve SOB         Core Components/Risk Factors/Patient Goals at Discharge (Final Review):      Goals and Risk Factor Review - 04/05/16 0910      Core  Components/Risk Factors/Patient Goals Review   Personal Goals Review Increase Strength and Stamina;Improve shortness of breath with ADL's   Review Exercise will increase strength, stamina, and improve SOB   Expected Outcomes Exercise will increase strength, stamina, and improve SOB      ITP Comments:   Comments: ITP REVIEW Pt has only attended 1 exercise/education session since admission. Anticipate progression in program.  Recommend continued exercise, life style modification, education, and utilization of breathing techniques to increase stamina and strength and decrease shortness of breath with exertion.

## 2016-04-08 ENCOUNTER — Encounter (HOSPITAL_COMMUNITY): Admission: RE | Admit: 2016-04-08 | Payer: Medicare Other | Source: Ambulatory Visit

## 2016-04-12 ENCOUNTER — Ambulatory Visit (HOSPITAL_BASED_OUTPATIENT_CLINIC_OR_DEPARTMENT_OTHER)
Admission: RE | Admit: 2016-04-12 | Discharge: 2016-04-12 | Disposition: A | Discharge status: Home / Self Care | Payer: Medicare Other | Source: Ambulatory Visit | Attending: Internal Medicine | Admitting: Internal Medicine

## 2016-04-12 ENCOUNTER — Ambulatory Visit (HOSPITAL_COMMUNITY)
Admission: RE | Admit: 2016-04-12 | Discharge: 2016-04-12 | Disposition: A | Discharge status: Home / Self Care | Payer: Medicare Other | Source: Ambulatory Visit | Attending: Internal Medicine | Admitting: Internal Medicine

## 2016-04-12 DIAGNOSIS — I340 Nonrheumatic mitral (valve) insufficiency: Secondary | ICD-10-CM | POA: Insufficient documentation

## 2016-04-12 DIAGNOSIS — I5042 Chronic combined systolic (congestive) and diastolic (congestive) heart failure: Secondary | ICD-10-CM | POA: Diagnosis not present

## 2016-04-12 DIAGNOSIS — J449 Chronic obstructive pulmonary disease, unspecified: Secondary | ICD-10-CM | POA: Diagnosis not present

## 2016-04-12 DIAGNOSIS — I351 Nonrheumatic aortic (valve) insufficiency: Secondary | ICD-10-CM | POA: Diagnosis not present

## 2016-04-12 DIAGNOSIS — I110 Hypertensive heart disease with heart failure: Secondary | ICD-10-CM | POA: Diagnosis not present

## 2016-04-12 DIAGNOSIS — I509 Heart failure, unspecified: Secondary | ICD-10-CM | POA: Diagnosis present

## 2016-04-12 DIAGNOSIS — I252 Old myocardial infarction: Secondary | ICD-10-CM | POA: Insufficient documentation

## 2016-04-12 DIAGNOSIS — I255 Ischemic cardiomyopathy: Secondary | ICD-10-CM | POA: Insufficient documentation

## 2016-04-12 LAB — BASIC METABOLIC PANEL
ANION GAP: 7 (ref 5–15)
BUN: 17 mg/dL (ref 6–20)
CHLORIDE: 108 mmol/L (ref 101–111)
CO2: 27 mmol/L (ref 22–32)
CREATININE: 1.32 mg/dL — AB (ref 0.61–1.24)
Calcium: 9.5 mg/dL (ref 8.9–10.3)
GFR calc non Af Amer: 50 mL/min — ABNORMAL LOW (ref >60)
GFR, EST AFRICAN AMERICAN: 58 mL/min — AB (ref >60)
GLUCOSE: 97 mg/dL (ref 65–99)
Potassium: 4.7 mmol/L (ref 3.5–5.1)
Sodium: 142 mmol/L (ref 135–145)

## 2016-04-12 NOTE — Progress Notes (Signed)
  Echocardiogram 2D Echocardiogram has been performed.  Bobbye Charleston 04/12/2016, 10:58 AM

## 2016-04-13 ENCOUNTER — Encounter (HOSPITAL_COMMUNITY)
Admission: RE | Admit: 2016-04-13 | Discharge: 2016-04-13 | Disposition: A | Discharge status: Home / Self Care | Payer: Medicare Other | Source: Ambulatory Visit | Attending: Internal Medicine | Admitting: Internal Medicine

## 2016-04-13 VITALS — Wt 128.7 lb

## 2016-04-13 DIAGNOSIS — I5021 Acute systolic (congestive) heart failure: Secondary | ICD-10-CM

## 2016-04-13 NOTE — Progress Notes (Signed)
Daily Session Note  Patient Details  Name: Bruce Travis MRN: [id number] Date of Birth: 1935-04-30 Referring Provider:   April Manson Pulmonary Rehab Walk Test from 03/25/2016 in [place]  Referring Provider  Dr. Haroldine Laws      Encounter Date: 04/13/2016  Check In:     Session Check In - 04/13/16 1020      Check-In   Location [facility]   Staff Present Su Hilt, MS, ACSM RCEP, Exercise Physiologist;Portia Rollene Rotunda, Therapist, sports, BSN;Ramon Dredge, RN, MHA;Lisa Ysidro Evert, RN   Supervising physician immediately available to respond to emergencies [facility] immediately available   Physician(s) Dr. Roel Cluck   Medication changes reported     No   Fall or balance concerns reported    No   Warm-up and Cool-down Performed as group-led instruction   Resistance Training Performed Yes   VAD Patient? No     Pain Assessment   Currently in Pain? No/denies   Multiple Pain Sites No      Capillary Blood Glucose: No results found for this or any previous visit (from the past 24 hour(s)).      Exercise Prescription Changes - 04/13/16 1200      Exercise Review   Progression Yes     Response to Exercise   Blood Pressure (Admit) 150/68   Blood Pressure (Exercise) 142/86   Blood Pressure (Exit) 110/70   Heart Rate (Admit) 66 bpm   Heart Rate (Exercise) 83 bpm   Heart Rate (Exit) 74 bpm   Oxygen Saturation (Admit) 94 %   Oxygen Saturation (Exercise) 86 %   Oxygen Saturation (Exit) 93 %   Rating of Perceived Exertion (Exercise) 15   Perceived Dyspnea (Exercise) 2   Duration Progress to 45 minutes of aerobic exercise without signs/symptoms of physical distress   Intensity THRR unchanged     Progression   Progression Continue progressive overload as per policy without signs/symptoms or physical distress.     Resistance Training   Training Prescription Yes   Weight orange bands   Reps 10-12     Interval Training   Interval  Training No     Oxygen   Oxygen Continuous   Liters 3     Bike   Level 3   Minutes 17     NuStep   Level 2   Minutes 17   METs 1.8     Track   Laps 14   Minutes 17     Goals Met:  Exercise tolerated well No report of cardiac concerns or symptoms Strength training completed today  Goals Unmet:  Not Applicable  Comments: Service time is from 10:30am to 12:05pm    Dr. Rush Farmer is Medical Director for Pulmonary Rehab at [facility]

## 2016-04-15 ENCOUNTER — Encounter (HOSPITAL_COMMUNITY)
Admission: RE | Admit: 2016-04-15 | Discharge: 2016-04-15 | Disposition: A | Discharge status: Home / Self Care | Payer: Medicare Other | Source: Ambulatory Visit | Attending: Internal Medicine | Admitting: Internal Medicine

## 2016-04-15 VITALS — Wt 128.3 lb

## 2016-04-15 DIAGNOSIS — I5021 Acute systolic (congestive) heart failure: Secondary | ICD-10-CM

## 2016-04-15 NOTE — Progress Notes (Signed)
Daily Session Note  Patient Details  Name: Bruce Travis MRN: [id number] Date of Birth: [birth year] Referring Provider:   April Manson Pulmonary Rehab Walk Test from 03/25/2016 in [place]  Referring Provider  Dr. Haroldine Laws      Encounter Date: 04/15/2016  Check In:     Session Check In - 04/15/16 1018      Check-In   Location [facility]   Staff Present Su Hilt, MS, ACSM RCEP, Exercise Physiologist;Portia Rollene Rotunda, Therapist, sports, BSN;Ramon Dredge, RN, MHA;Esmirna Ravan Ysidro Evert, RN   Supervising physician immediately available to respond to emergencies [facility] immediately available   Physician(s) Dr. Allyson Sabal   Medication changes reported     No   Fall or balance concerns reported    No   Warm-up and Cool-down Performed as group-led instruction   Resistance Training Performed Yes   VAD Patient? No     Pain Assessment   Currently in Pain? No/denies   Multiple Pain Sites No      Capillary Blood Glucose: No results found for this or any previous visit (from the past 24 hour(s)).      Exercise Prescription Changes - 04/15/16 1200      Exercise Review   Progression Yes     Response to Exercise   Blood Pressure (Admit) 118/60   Blood Pressure (Exercise) 132/74   Blood Pressure (Exit) 142/82   Heart Rate (Admit) 64 bpm   Heart Rate (Exercise) 78 bpm   Heart Rate (Exit) 63 bpm   Oxygen Saturation (Admit) 93 %   Oxygen Saturation (Exercise) 88 %   Oxygen Saturation (Exit) 95 %   Rating of Perceived Exertion (Exercise) 13   Perceived Dyspnea (Exercise) 1   Duration Progress to 45 minutes of aerobic exercise without signs/symptoms of physical distress   Intensity THRR unchanged     Progression   Progression Continue to progress workloads to maintain intensity without signs/symptoms of physical distress.     Resistance Training   Training Prescription Yes   Weight orange bands   Reps 10-12  10 minutes of strength  training     Interval Training   Interval Training No     Oxygen   Oxygen Continuous   Liters 3     NuStep   Level 3   Minutes 17   METs 2.2     Track   Laps 15   Minutes 17     Goals Met:  Exercise tolerated well No report of cardiac concerns or symptoms Strength training completed today  Goals Unmet:  Not Applicable  Comments: Service time is from 1030 to 1235    Dr. Rush Farmer is Medical Director for Pulmonary Rehab at [facility].

## 2016-04-15 NOTE — Progress Notes (Signed)
I have reviewed a Home Exercise Prescription with Bruce Travis . Bruce Travis is not currently exercising at home.  The patient was advised to walk 2-3 days a week for 30 minutes.  Bruce Travis and I discussed how to progress their exercise prescription.  The patient stated that their goals were to be able to ride a horse again and be able to mow the lawn.  The patient stated that they understand the exercise prescription.  We reviewed exercise guidelines, target heart rate during exercise, oxygen use, weather, home pulse oximeter, endpoints for exercise, and goals.  Patient is encouraged to come to me with any questions. I will continue to follow up with the patient to assist them with progression and safety.

## 2016-04-19 ENCOUNTER — Other Ambulatory Visit: Payer: Self-pay | Admitting: Family Medicine

## 2016-04-19 NOTE — Addendum Note (Signed)
Encounter addended by: Jolaine Artist, MD on: 04/19/2016 12:03 AM<BR>    Actions taken: Sign clinical note, LOS modified

## 2016-04-20 ENCOUNTER — Encounter (HOSPITAL_COMMUNITY)
Admission: RE | Admit: 2016-04-20 | Discharge: 2016-04-20 | Disposition: A | Discharge status: Home / Self Care | Payer: Medicare Other | Source: Ambulatory Visit | Attending: Internal Medicine | Admitting: Internal Medicine

## 2016-04-20 VITALS — Wt 127.6 lb

## 2016-04-20 DIAGNOSIS — I5021 Acute systolic (congestive) heart failure: Secondary | ICD-10-CM | POA: Diagnosis not present

## 2016-04-20 NOTE — Progress Notes (Signed)
Daily Session Note  Patient Details  Name: Bruce Travis MRN: [id number] Date of Birth: 25-Sep-1934 Referring Provider:   April Manson Pulmonary Rehab Walk Test from 03/25/2016 in [place]  Referring Provider  Dr. Haroldine Laws      Encounter Date: 04/20/2016  Check In:     Session Check In - 04/20/16 1021      Check-In   Location [facility]   Staff Present Su Hilt, MS, ACSM RCEP, Exercise Physiologist;Laban Orourke Rollene Rotunda, Therapist, sports, BSN;Ramon Dredge, RN, MHA;Lisa Ysidro Evert, RN;Joan Leonia Reeves, RN, BSN   Supervising physician immediately available to respond to emergencies [facility] immediately available   Physician(s) Dr. Marily Memos   Medication changes reported     No   Fall or balance concerns reported    No   Warm-up and Cool-down Performed as group-led instruction   Resistance Training Performed Yes   VAD Patient? No     Pain Assessment   Currently in Pain? No/denies   Multiple Pain Sites No      Capillary Blood Glucose: No results found for this or any previous visit (from the past 24 hour(s)).      Exercise Prescription Changes - 04/20/16 1217      Exercise Review   Progression Yes     Response to Exercise   Blood Pressure (Admit) 132/70   Blood Pressure (Exercise) 98/60   Blood Pressure (Exit) 122/64   Heart Rate (Admit) 65 bpm   Heart Rate (Exercise) 78 bpm   Heart Rate (Exit) 67 bpm   Oxygen Saturation (Admit) 94 %   Oxygen Saturation (Exercise) 87 %   Oxygen Saturation (Exit) 93 %   Rating of Perceived Exertion (Exercise) 15   Perceived Dyspnea (Exercise) 3   Duration Progress to 45 minutes of aerobic exercise without signs/symptoms of physical distress   Intensity THRR unchanged     Progression   Progression Continue to progress workloads to maintain intensity without signs/symptoms of physical distress.     Resistance Training   Training Prescription Yes   Weight orange bands   Reps 10-12  10  minutes of strength training     Interval Training   Interval Training No     Oxygen   Oxygen Continuous   Liters 3     Bike   Level 3   Minutes 17     NuStep   Level 4   Minutes 17   METs 2.4     Track   Laps 15   Minutes 17     Home Exercise Plan   Plans to continue exercise at Home   Frequency Add 3 additional days to program exercise sessions.     Goals Met:  Improved SOB with ADL's Exercise tolerated well Queuing for purse lip breathing No report of cardiac concerns or symptoms Strength training completed today  Goals Unmet:  Not Applicable  Comments: Service time is from 1030 to 1200   Dr. Rush Farmer is Medical Director for Pulmonary Rehab at [facility].

## 2016-04-22 ENCOUNTER — Encounter (HOSPITAL_COMMUNITY)
Admission: RE | Admit: 2016-04-22 | Discharge: 2016-04-22 | Disposition: A | Discharge status: Home / Self Care | Payer: Medicare Other | Source: Ambulatory Visit | Attending: Internal Medicine | Admitting: Internal Medicine

## 2016-04-22 VITALS — Wt 128.7 lb

## 2016-04-22 DIAGNOSIS — I5021 Acute systolic (congestive) heart failure: Secondary | ICD-10-CM

## 2016-04-22 NOTE — Progress Notes (Signed)
Bruce Travis 81 y.o. male Nutrition Note Spoke with pt. Pt is underweight for a pulmonary pt. Pt reports he lost 15 lb when he was dx with Celiac disease and "I haven't been able to get those 15 lb back." Pt educated re: high calorie, high protein diet.  Pt's Rate Your Plate results not reviewed with pt due to pt's need to gain wt. Pt states he avoids most salty food; uses fresh/frozen vegetables. Pt does not add salt to food. Pt expressed understanding of the information reviewed via feedback method.    Lab Results  Component Value Date   HGBA1C 5.6 02/05/2016   Nutrition Diagnosis ? Food-and nutrition-related knowledge deficit related to lack of exposure to information as related to diagnosis of pulmonary disease ? Increased energy expenditure related to increased energy requirements during COPD exacerbation as evidenced by BMI <21 and h/o slow, chronic wt loss. ?  Nutrition Intervention ? Pt's individual nutrition plan and goals reviewed with pt. ? Benefits of adopting healthy eating habits discussed when pt's Rate Your Plate reviewed. ? Pt to attend the Nutrition and Lung Disease class ? Handouts given: High Calorie, High Protein diet; Suggestions for Increasing Calories and Protein; and High Calorie, High Protein recipes ? Continual client-centered nutrition education by RD, as part of interdisciplinary care. Goal(s) 1. Identify food quantities necessary to achieve wt gain of 1 -2# per week to a wt gain goal of 2.7-10.9 kg (6-24 lb) at graduation from pulmonary rehab. Monitor and Evaluate progress toward nutrition goal with team.   Derek Mound, M.Ed, RD, LDN, CDE 04/22/2016 1:59 PM

## 2016-04-22 NOTE — Progress Notes (Signed)
Daily Session Note  Patient Details  Name: Bruce Travis MRN: [id number] Date of Birth: 10-23-1934 Referring Provider:   April Manson Pulmonary Rehab Walk Test from 03/25/2016 in [place]  Referring Provider  Dr. Haroldine Laws      Encounter Date: 04/22/2016  Check In:     Session Check In - 04/22/16 1011      Check-In   Location [facility]   Staff Present Su Hilt, MS, ACSM RCEP, Exercise Physiologist;Annedrea Stackhouse, RN, MHA;Portia Rollene Rotunda, RN, Roque Cash, RN   Supervising physician immediately available to respond to emergencies [facility] immediately available   Physician(s) Dr. Marily Memos   Medication changes reported     No   Fall or balance concerns reported    No   Warm-up and Cool-down Performed as group-led instruction   Resistance Training Performed Yes   VAD Patient? No     Pain Assessment   Currently in Pain? No/denies   Multiple Pain Sites No      Capillary Blood Glucose: No results found for this or any previous visit (from the past 24 hour(s)).      Exercise Prescription Changes - 04/22/16 1200      Response to Exercise   Blood Pressure (Admit) 126/60   Blood Pressure (Exercise) 136/70   Blood Pressure (Exit) 134/74   Heart Rate (Admit) 61 bpm   Heart Rate (Exercise) 79 bpm   Heart Rate (Exit) 66 bpm   Oxygen Saturation (Admit) 93 %   Oxygen Saturation (Exercise) 89 %   Oxygen Saturation (Exit) 95 %   Rating of Perceived Exertion (Exercise) 13   Perceived Dyspnea (Exercise) 0   Duration Progress to 45 minutes of aerobic exercise without signs/symptoms of physical distress   Intensity THRR unchanged     Progression   Progression Continue to progress workloads to maintain intensity without signs/symptoms of physical distress.     Resistance Training   Training Prescription Yes   Weight orange bands   Reps 10-12  10 minutes of strength training     Interval Training   Interval  Training No     Oxygen   Oxygen Continuous   Liters 3     Recumbant Bike   Level 3   Minutes 14     NuStep   Level 4   Minutes 17   METs 2.4     Home Exercise Plan   Plans to continue exercise at Home   Frequency Add 3 additional days to program exercise sessions.     Goals Met:  Exercise tolerated well No report of cardiac concerns or symptoms Strength training completed today  Goals Unmet:  Not Applicable  Comments: Service time is from 10:30am to 12:00pm    Dr. Rush Farmer is Medical Director for Pulmonary Rehab at [facility].

## 2016-04-27 ENCOUNTER — Encounter (HOSPITAL_COMMUNITY)
Admission: RE | Admit: 2016-04-27 | Discharge: 2016-04-27 | Disposition: A | Discharge status: Home / Self Care | Payer: Medicare Other | Source: Ambulatory Visit | Attending: Internal Medicine | Admitting: Internal Medicine

## 2016-04-27 VITALS — Wt 128.1 lb

## 2016-04-27 DIAGNOSIS — I5021 Acute systolic (congestive) heart failure: Secondary | ICD-10-CM | POA: Diagnosis present

## 2016-04-27 NOTE — Progress Notes (Signed)
Daily Session Note  Patient Details  Name: Bruce Travis MRN: [id number] Date of Birth: 1935-07-06 Referring Provider:   April Manson Pulmonary Rehab Walk Test from 03/25/2016 in [place]  Referring Provider  Dr. Haroldine Laws      Encounter Date: 04/27/2016  Check In:     Session Check In - 04/27/16 1023      Check-In   Location [facility]   Staff Present Rosebud Poles, RN, BSN;Lisa Ysidro Evert, Felipe Drone, RN, MHA;Rocko Fesperman Rollene Rotunda, RN, BSN   Supervising physician immediately available to respond to emergencies [facility] immediately available   Physician(s) Dr. Marily Memos   Medication changes reported     No   Fall or balance concerns reported    No   Warm-up and Cool-down Performed as group-led instruction   Resistance Training Performed Yes   VAD Patient? No     Pain Assessment   Currently in Pain? No/denies   Multiple Pain Sites No      Capillary Blood Glucose: No results found for this or any previous visit (from the past 24 hour(s)).      Exercise Prescription Changes - 04/27/16 1246      Response to Exercise   Blood Pressure (Admit) 118/62   Blood Pressure (Exercise) 126/60   Blood Pressure (Exit) 124/70   Heart Rate (Admit) 65 bpm   Heart Rate (Exercise) 78 bpm   Heart Rate (Exit) 64 bpm   Oxygen Saturation (Admit) 94 %   Oxygen Saturation (Exercise) 87 %   Oxygen Saturation (Exit) 96 %   Rating of Perceived Exertion (Exercise) 13   Perceived Dyspnea (Exercise) 1   Duration Progress to 45 minutes of aerobic exercise without signs/symptoms of physical distress   Intensity THRR unchanged     Progression   Progression Continue to progress workloads to maintain intensity without signs/symptoms of physical distress.     Resistance Training   Training Prescription Yes   Weight orange bands   Reps 10-12  10 minutes of strength training     Interval Training   Interval Training No     Oxygen   Oxygen  Continuous   Liters 3     Recumbant Bike   Level 3   Minutes 17     NuStep   Level 4   Minutes 17   METs 2.5     Track   Laps 14   Minutes 17     Goals Met:  [place] with exercise equipment Improved SOB with ADL's Using PLB without cueing & demonstrates good technique Exercise tolerated well No report of cardiac concerns or symptoms Strength training completed today  Goals Unmet:  Not Applicable  Comments: Service time is from 1030 to 1215   Dr. Rush Farmer is Medical Director for Pulmonary Rehab at [facility].

## 2016-04-29 ENCOUNTER — Encounter (HOSPITAL_COMMUNITY)
Admission: RE | Admit: 2016-04-29 | Discharge: 2016-04-29 | Disposition: A | Discharge status: Home / Self Care | Payer: Medicare Other | Source: Ambulatory Visit | Attending: Internal Medicine | Admitting: Internal Medicine

## 2016-04-29 VITALS — Wt 129.4 lb

## 2016-04-29 DIAGNOSIS — I5021 Acute systolic (congestive) heart failure: Secondary | ICD-10-CM | POA: Diagnosis not present

## 2016-04-29 NOTE — Progress Notes (Signed)
Daily Session Note  Patient Details  Name: Bruce Travis MRN: [id number] Date of Birth: [birth year] Referring Provider:   April Manson Pulmonary Rehab Walk Test from 03/25/2016 in [place]  Referring Provider  Dr. Haroldine Laws      Encounter Date: 04/29/2016  Check In:     Session Check In - 04/29/16 1016      Check-In   Location [facility]   Staff Present Su Hilt, MS, ACSM RCEP, Exercise Physiologist;Joan Leonia Reeves, Therapist, sports, BSN;Ramon Dredge, RN, MHA;Portia Rollene Rotunda, RN, BSN   Supervising physician immediately available to respond to emergencies [facility] immediately available   Physician(s) Dr. Allyson Sabal   Medication changes reported     No   Fall or balance concerns reported    No   Warm-up and Cool-down Performed as group-led instruction   Resistance Training Performed No   VAD Patient? No     Pain Assessment   Currently in Pain? No/denies   Multiple Pain Sites No      Capillary Blood Glucose: No results found for this or any previous visit (from the past 24 hour(s)).      Exercise Prescription Changes - 04/29/16 1200      Response to Exercise   Blood Pressure (Admit) 98/50   Blood Pressure (Exercise) 128/70   Blood Pressure (Exit) 100/60   Heart Rate (Admit) 61 bpm   Heart Rate (Exercise) 73 bpm   Heart Rate (Exit) 69 bpm   Oxygen Saturation (Admit) 95 %   Oxygen Saturation (Exercise) 85 %   Oxygen Saturation (Exit) 96 %   Rating of Perceived Exertion (Exercise) 13   Perceived Dyspnea (Exercise) 2   Duration Progress to 45 minutes of aerobic exercise without signs/symptoms of physical distress   Intensity THRR unchanged     Progression   Progression Continue to progress workloads to maintain intensity without signs/symptoms of physical distress.     Resistance Training   Training Prescription Yes   Weight orange bands   Reps 10-12  10 minutes of strength training     Interval Training   Interval  Training No     Oxygen   Oxygen Continuous   Liters 3     Recumbant Bike   Level 3   Minutes 17     NuStep   Level 4   Minutes 17   METs 2.5     Track   Laps 15   Minutes 17     Goals Met:  Exercise tolerated well No report of cardiac concerns or symptoms Strength training completed today  Goals Unmet:  Not Applicable  Comments: Service time is from 10:30am to 12:10pm    Dr. Rush Farmer is Medical Director for Pulmonary Rehab at [facility].

## 2016-05-04 ENCOUNTER — Encounter (HOSPITAL_COMMUNITY)
Admission: RE | Admit: 2016-05-04 | Discharge: 2016-05-04 | Disposition: A | Discharge status: Home / Self Care | Payer: Medicare Other | Source: Ambulatory Visit | Attending: Internal Medicine | Admitting: Internal Medicine

## 2016-05-04 VITALS — Wt 128.7 lb

## 2016-05-04 DIAGNOSIS — I5021 Acute systolic (congestive) heart failure: Secondary | ICD-10-CM | POA: Diagnosis not present

## 2016-05-04 NOTE — Progress Notes (Signed)
Daily Session Note  Patient Details  Name: Bruce Travis MRN: [id number] Date of Birth: 04/19/1935 Referring Provider:   Flowsheet Row Pulmonary Rehab Walk Test from 03/25/2016 in [facility]  Referring Provider  [name]      Encounter Date: 05/04/2016  Check In:     Session Check In - 05/04/16 1000      Check-In   Location [facility]   Staff Present Molly diVincenzo, MS, ACSM RCEP, Exercise Physiologist;Lisa Hughes, RN;Portia Payne, RN, BSN;Annedrea Stackhouse, RN, MHA   Supervising physician immediately available to respond to emergencies [facility] immediately available   Physician(s) Dr. Merrell   Medication changes reported     No   Fall or balance concerns reported    No   Warm-up and Cool-down Performed as group-led instruction   Resistance Training Performed Yes   VAD Patient? No     Pain Assessment   Currently in Pain? No/denies   Multiple Pain Sites No      Capillary Blood Glucose: No results found for this or any previous visit (from the past 24 hour(s)).      Exercise Prescription Changes - 05/04/16 1200      Response to Exercise   Blood Pressure (Admit) 110/58   Blood Pressure (Exercise) 120/60   Blood Pressure (Exit) 118/60   Heart Rate (Admit) 64 bpm   Heart Rate (Exercise) 84 bpm   Heart Rate (Exit) 70 bpm   Oxygen Saturation (Admit) 93 %   Oxygen Saturation (Exercise) 88 %   Oxygen Saturation (Exit) 93 %   Rating of Perceived Exertion (Exercise) 15   Perceived Dyspnea (Exercise) 2   Duration Progress to 45 minutes of aerobic exercise without signs/symptoms of physical distress   Intensity THRR unchanged     Progression   Progression Continue to progress workloads to maintain intensity without signs/symptoms of physical distress.     Resistance Training   Training Prescription Yes   Weight orange bands   Reps 10-12  10 minutes of strength training     Interval Training   Interval  Training No     Oxygen   Oxygen Continuous   Liters 3     Recumbant Bike   Level 3   Minutes 17     NuStep   Level 4   Minutes 17   METs 2.8     Track   Laps 12   Minutes 17     Goals Met:  Exercise tolerated well No report of cardiac concerns or symptoms Strength training completed today  Goals Unmet:  Not Applicable  Comments: Service time is from 1030 to 1210    [name] is Medical Director for Pulmonary Rehab at [facility]. 

## 2016-05-06 ENCOUNTER — Encounter (HOSPITAL_COMMUNITY)
Admission: RE | Admit: 2016-05-06 | Discharge: 2016-05-06 | Disposition: A | Discharge status: Home / Self Care | Payer: Medicare Other | Source: Ambulatory Visit | Attending: Internal Medicine | Admitting: Internal Medicine

## 2016-05-06 DIAGNOSIS — I5021 Acute systolic (congestive) heart failure: Secondary | ICD-10-CM | POA: Diagnosis not present

## 2016-05-06 NOTE — Progress Notes (Signed)
Daily Session Note  Patient Details  Name: Bruce Travis MRN: [id number] Date of Birth: Dec 17, 1934 Referring Provider:   April Manson Pulmonary Rehab Walk Test from 03/25/2016 in [place]  Referring Provider  Dr. Haroldine Laws      Encounter Date: 05/06/2016  Check In:     Session Check In - 05/06/16 1024      Check-In   Location [facility]   Staff Present Rosebud Poles, RN, BSN;Molly diVincenzo, MS, ACSM RCEP, Exercise Physiologist;Lisa Ysidro Evert, Felipe Drone, RN, MHA;Portia Rollene Rotunda, RN, BSN   Supervising physician immediately available to respond to emergencies [facility] immediately available   Physician(s) Dr. Waldron Labs   Medication changes reported     No   Fall or balance concerns reported    No   Warm-up and Cool-down Performed as group-led instruction   Resistance Training Performed Yes   VAD Patient? No     Pain Assessment   Currently in Pain? No/denies   Multiple Pain Sites No      Capillary Blood Glucose: No results found for this or any previous visit (from the past 24 hour(s)).      Exercise Prescription Changes - 05/06/16 1200      Response to Exercise   Blood Pressure (Admit) 100/60   Blood Pressure (Exercise) 126/80   Blood Pressure (Exit) 126/72   Heart Rate (Admit) 61 bpm   Heart Rate (Exercise) 91 bpm   Heart Rate (Exit) 68 bpm   Oxygen Saturation (Admit) 95 %   Oxygen Saturation (Exercise) 91 %   Oxygen Saturation (Exit) 92 %   Rating of Perceived Exertion (Exercise) 13   Perceived Dyspnea (Exercise) 2   Duration Progress to 45 minutes of aerobic exercise without signs/symptoms of physical distress   Intensity THRR unchanged     Progression   Progression Continue to progress workloads to maintain intensity without signs/symptoms of physical distress.     Resistance Training   Training Prescription Yes   Weight orange bands   Reps 10-12  10 minutes of strength training     Interval Training   Interval Training No     Oxygen   Oxygen Continuous     Recumbant Bike   Level 3   Minutes 17     NuStep   Level 4   Minutes 17   METs 2.3     Goals Met:  Exercise tolerated well Strength training completed today  Goals Unmet:  Not Applicable  Comments: Service time is from 1030 to 1205    Dr. Rush Farmer is Medical Director for Pulmonary Rehab at [facility].

## 2016-05-06 NOTE — Progress Notes (Signed)
Pulmonary Individual Treatment Plan  Patient Details  Name: Bruce Travis MRN: [id number] Date of Birth: 05/24/1935 Referring Provider:   April Manson Pulmonary Rehab Walk Test from 03/25/2016 in [place]  Referring Provider  Dr. Haroldine Laws      Initial Encounter Date:  Flowsheet Row Pulmonary Rehab Walk Test from 03/25/2016 in [place]  Date  03/25/16  Referring Provider  Dr. Haroldine Laws      Visit Diagnosis: Acute systolic heart failure ([place])  Patient's Home Medications on Admission:   Current Outpatient Prescriptions:  .  AMBULATORY NON FORMULARY MEDICATION, O2 2 Lpm continuous, Disp: , Rfl:  .  amiodarone (PACERONE) 200 MG tablet, Take 1 tablet (200 mg total) by mouth daily., Disp: 30 tablet, Rfl: 6 .  aspirin EC 81 MG tablet, Take 81 mg by mouth daily., Disp: , Rfl:  .  azelastine (ASTELIN) 0.1 % nasal spray, Place 2 sprays into both nostrils 2 (two) times daily as needed for rhinitis or allergies. , Disp: , Rfl:  .  montelukast (SINGULAIR) 10 MG tablet, TAKE 1 TABLET BY MOUTH DAILY., Disp: 90 tablet, Rfl: 0 .  Multiple Vitamin (MULTIVITAMIN WITH MINERALS) TABS tablet, Take 1 tablet by mouth daily., Disp: , Rfl:  .  pantoprazole (PROTONIX) 40 MG tablet, TAKE ONE TABLET BY MOUTH DAILY., Disp: 30 tablet, Rfl: 0 .  polyethylene glycol (MIRALAX / GLYCOLAX) packet, Take 17 g by mouth daily as needed for mild constipation. , Disp: , Rfl:  .  PROAIR HFA 108 (90 BASE) MCG/ACT inhaler, INHALE 2 PUFFS EVERY 4 HOURS AS NEEDED FOR SHORTNESS OF BREATH., Disp: 8.5 g, Rfl: 5 .  sacubitril-valsartan (ENTRESTO) 49-51 MG, Take 1 tablet by mouth 2 (two) times daily., Disp: 60 tablet, Rfl: 6 .  SPIRIVA HANDIHALER 18 MCG inhalation capsule, INHALE 1 CAPSULE ONCE DAILY AS DIRECTED, Disp: 30 capsule, Rfl: 11 .  spironolactone (ALDACTONE) 25 MG tablet, Take 0.5 tablets (12.5 mg total) by mouth daily., Disp: 16 tablet, Rfl: 6 .  SYMBICORT  160-4.5 MCG/ACT inhaler, INHALE 2 PUFFS FIRST THING IN MORNING AND THEN ANOTHER 2 PUFFS ABOUT 12 HOURS LATER., Disp: 10.2 g, Rfl: 11  Past Medical History: Past Medical History:  Diagnosis Date  . Allergic rhinitis   . Bronchiectasis   . CAD (coronary artery disease)   . Celiac disease   . Chronic heart failure ([place])   . Chronic kidney disease (CKD), stage III (moderate)   . COPD (chronic obstructive pulmonary disease) ([place])   . Diverticulosis of colon (without mention of hemorrhage)   . Esophageal reflux   . Family history of adverse reaction to anesthesia    daughter gets PONV  . Family hx of colon cancer   . History of stomach ulcers 1980s  . Hyperlipemia   . Hypertension   . 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 Myocardial infarction [facility])    "previous MI/echo in 12/2015"  . Nephrolithiasis    "got them now; never had OR/scopes" (02/03/2016)  . On home oxygen therapy    "2L; 24/7" (02/03/2016)  . Other diseases of lung, not elsewhere classified   . Personal history of colonic polyps 04/26/2007   hyperplastic   . Pneumonia "several times"    Tobacco Use: History  Smoking Status  . Former Smoker  . Packs/day: 2.00  . Years: 35.00  . Types: Cigarettes  . Quit date: 01/21/1989  Smokeless Tobacco  . Former Systems developer  .  Types: Chew  . Quit date: 08/23/2001    Labs: Recent Review Flowsheet Data    Labs for ITP Cardiac and Pulmonary Rehab Latest Ref Rng & Units 02/03/2016 02/04/2016 02/05/2016 02/06/2016 03/09/2016   Cholestrol 125 - 200 mg/dL - - - - 208(H)   LDLCALC <130 mg/dL - - - - 132(H)   HDL >=40 mg/dL - - - - 33(L)   Trlycerides <150 mg/dL - - - - 217(H)   Hemoglobin A1c 4.8 - 5.6 % - - 5.6 - -   PHART 7.350 - 7.450 7.375 - - - -   PCO2ART 35.0 - 45.0 mmHg 37.8 - - - -   HCO3 20.0 - 24.0 mEq/L 22.1 - - - -   TCO2 0 - 100 mmol/L 23 - - - -   ACIDBASEDEF 0.0 - 2.0 mmol/L 3.0(H) - - - -   O2SAT % 91.0 67.0 80.1 72.0 -      Capillary Blood  Glucose: No results found for: GLUCAP   ADL UCSD:     Pulmonary Assessment Scores    Row Name 03/24/16 1519         ADL UCSD   ADL Phase Entry     SOB Score total 43        Pulmonary Function Assessment:     Pulmonary Function Assessment - 03/22/16 1036      Breath   Bilateral Breath Sounds Clear   Shortness of Breath Yes;Limiting activity      Exercise Target Goals:    Exercise Program Goal: Individual exercise prescription set with THRR, safety & activity barriers. Participant demonstrates ability to understand and report RPE using BORG scale, to self-measure pulse accurately, and to acknowledge the importance of the exercise prescription.  Exercise Prescription Goal: Starting with aerobic activity 30 plus minutes a day, 3 days per week for initial exercise prescription. Provide home exercise prescription and guidelines that participant acknowledges understanding prior to discharge.  Activity Barriers & Risk Stratification:     Activity Barriers & Cardiac Risk Stratification - 03/22/16 1034      Activity Barriers & Cardiac Risk Stratification   Activity Barriers None      6 Minute Walk:     6 Minute Walk    Row Name 03/25/16 1633         6 Minute Walk   Phase Initial     Distance 1175 feet     Walk Time 6 minutes     # of Rest Breaks 0     MPH 2.22     METS 2.68     RPE 13     Perceived Dyspnea  3     Symptoms No     Resting HR 65 bpm     Resting BP 162/80     Max Ex. HR 78 bpm     Max Ex. BP 158/82       Interval Oxygen   Interval Oxygen? Yes     Baseline Oxygen Saturation % 65 %     Baseline Liters of Oxygen 2 L     1 Minute Oxygen Saturation % 93 %     1 Minute Liters of Oxygen 2 L     2 Minute Oxygen Saturation % 92 %     2 Minute Liters of Oxygen 2 L     3 Minute Oxygen Saturation % 88 %     3 Minute Liters of Oxygen 2 L     4 Minute Oxygen  Saturation % 88 %     4 Minute Liters of Oxygen 2 L     5 Minute Oxygen Saturation % 87  %     5 Minute Liters of Oxygen 3 L     6 Minute Oxygen Saturation % 86 %     6 Minute Liters of Oxygen 3 L     2 Minute Post Oxygen Saturation % 91 %     2 Minute Post Liters of Oxygen 3 L        Initial Exercise Prescription:     Initial Exercise Prescription - 03/25/16 1600      Date of Initial Exercise RX and Referring Provider   Date 03/25/16   Referring Provider Dr. Haroldine Laws     Oxygen   Oxygen Continuous   Liters 3     Bike   Level 0.5   Minutes 17     NuStep   Level 1   Minutes 17   METs 1.7     Track   Laps 6   Minutes 17     Prescription Details   Frequency (times per week) 2   Duration Progress to 45 minutes of aerobic exercise without signs/symptoms of physical distress     Intensity   THRR 40-80% of Max Heartrate 56-113   Ratings of Perceived Exertion 11-13   Perceived Dyspnea 0-4     Progression   Progression Continue progressive overload as per policy without signs/symptoms or physical distress.     Resistance Training   Training Prescription Yes   Weight orange    Reps 10-12      Perform Capillary Blood Glucose checks as needed.  Exercise Prescription Changes:     Exercise Prescription Changes    Row Name 04/01/16 1200 04/06/16 1200 04/13/16 1200 04/15/16 1200 04/20/16 1217     Exercise Review   Progression  -  - Yes Yes Yes     Response to Exercise   Blood Pressure (Admit) 146/84 122/60 150/68 118/60 132/70   Blood Pressure (Exercise) 150/70 142/90 142/86 132/74 98/60   Blood Pressure (Exit) 140/66 118/60 110/70 142/82 122/64   Heart Rate (Admit) 66 bpm 65 bpm 66 bpm 64 bpm 65 bpm   Heart Rate (Exercise) 89 bpm 69 bpm 83 bpm 78 bpm 78 bpm   Heart Rate (Exit) 67 bpm 71 bpm 74 bpm 63 bpm 67 bpm   Oxygen Saturation (Admit) 94 % 93 % 94 % 93 % 94 %   Oxygen Saturation (Exercise) 89 % 88 % 86 % 88 % 87 %   Oxygen Saturation (Exit) 95 % 94 % 93 % 95 % 93 %   Rating of Perceived Exertion (Exercise) 13 13 15 13 15    Perceived  Dyspnea (Exercise) 1 2 2 1 3    Duration Progress to 45 minutes of aerobic exercise without signs/symptoms of physical distress Progress to 45 minutes of aerobic exercise without signs/symptoms of physical distress Progress to 45 minutes of aerobic exercise without signs/symptoms of physical distress Progress to 45 minutes of aerobic exercise without signs/symptoms of physical distress Progress to 45 minutes of aerobic exercise without signs/symptoms of physical distress   Intensity THRR unchanged THRR unchanged THRR unchanged THRR unchanged THRR unchanged     Progression   Progression Continue progressive overload as per policy without signs/symptoms or physical distress. Continue progressive overload as per policy without signs/symptoms or physical distress. Continue progressive overload as per policy without signs/symptoms or physical distress. Continue to progress workloads  to maintain intensity without signs/symptoms of physical distress. Continue to progress workloads to maintain intensity without signs/symptoms of physical distress.     Resistance Training   Training Prescription Yes Yes Yes Yes Yes   Weight orange bands orange bands orange bands orange bands orange bands   Reps 10-12 10-12 10-12 10-12  10 minutes of strength training 10-12  10 minutes of strength training     Interval Training   Interval Training No No No No No     Oxygen   Oxygen Continuous Continuous Continuous Continuous Continuous   Liters 3 3 3 3 3      Bike   Level 0.5 2 3   - 3   Minutes 17 17 17   - 17     NuStep   Level  - 1 2 3 4    Minutes  - 17 17 17 17    METs  - 2.8 1.8 2.2 2.4     Track   Laps 4 12 14 15 15    Minutes 17 17 17 17 17      Home Exercise Plan   Plans to continue exercise at  -  -  - Home Home   Frequency  -  -  - Add 3 additional days to program exercise sessions. Add 3 additional days to program exercise sessions.   [place] Name 04/22/16 1200 04/27/16 1246 04/29/16 1200 05/04/16 1200        Response to Exercise   Blood Pressure (Admit) 126/60 118/62 98/50 110/58    Blood Pressure (Exercise) 136/70 126/60 128/70 120/60    Blood Pressure (Exit) 134/74 124/70 100/60 118/60    Heart Rate (Admit) 61 bpm 65 bpm 61 bpm 64 bpm    Heart Rate (Exercise) 79 bpm 78 bpm 73 bpm 84 bpm    Heart Rate (Exit) 66 bpm 64 bpm 69 bpm 70 bpm    Oxygen Saturation (Admit) 93 % 94 % 95 % 93 %    Oxygen Saturation (Exercise) 89 % 87 % 85 % 88 %    Oxygen Saturation (Exit) 95 % 96 % 96 % 93 %    Rating of Perceived Exertion (Exercise) 13 13 13 15     Perceived Dyspnea (Exercise) 0 1 2 2     Duration Progress to 45 minutes of aerobic exercise without signs/symptoms of physical distress Progress to 45 minutes of aerobic exercise without signs/symptoms of physical distress Progress to 45 minutes of aerobic exercise without signs/symptoms of physical distress Progress to 45 minutes of aerobic exercise without signs/symptoms of physical distress    Intensity THRR unchanged THRR unchanged THRR unchanged THRR unchanged      Progression   Progression Continue to progress workloads to maintain intensity without signs/symptoms of physical distress. Continue to progress workloads to maintain intensity without signs/symptoms of physical distress. Continue to progress workloads to maintain intensity without signs/symptoms of physical distress. Continue to progress workloads to maintain intensity without signs/symptoms of physical distress.      Resistance Training   Training Prescription Yes Yes Yes Yes    Weight orange bands orange bands orange bands orange bands    Reps 10-12  10 minutes of strength training 10-12  10 minutes of strength training 10-12  10 minutes of strength training 10-12  10 minutes of strength training      Interval Training   Interval Training No No No No      Oxygen   Oxygen Continuous Continuous Continuous Continuous    Liters 3 3 3  3  Recumbant Bike   Level 3 3 3 3      Minutes 14 17 17 17       NuStep   Level 4 4 4 4     Minutes 17 17 17 17     METs 2.4 2.5 2.5 2.8      Track   Laps  - 14 15 12     Minutes  - 17 17 17       Home Exercise Plan   Plans to continue exercise at Home  -  -  -    Frequency Add 3 additional days to program exercise sessions.  -  -  -       Exercise Comments:     Exercise Comments    Row Name 04/06/16 0753 04/15/16 1328 05/03/16 1151       Exercise Comments Patient has only attended one exercise session. Will cont. to monitor.  Home exercise completed today Patient is steadily progressing in workload intensities. He is up to 15 laps on the walking track. Will cont. to monitor.         Discharge Exercise Prescription (Final Exercise Prescription Changes):     Exercise Prescription Changes - 05/04/16 1200      Response to Exercise   Blood Pressure (Admit) 110/58   Blood Pressure (Exercise) 120/60   Blood Pressure (Exit) 118/60   Heart Rate (Admit) 64 bpm   Heart Rate (Exercise) 84 bpm   Heart Rate (Exit) 70 bpm   Oxygen Saturation (Admit) 93 %   Oxygen Saturation (Exercise) 88 %   Oxygen Saturation (Exit) 93 %   Rating of Perceived Exertion (Exercise) 15   Perceived Dyspnea (Exercise) 2   Duration Progress to 45 minutes of aerobic exercise without signs/symptoms of physical distress   Intensity THRR unchanged     Progression   Progression Continue to progress workloads to maintain intensity without signs/symptoms of physical distress.     Resistance Training   Training Prescription Yes   Weight orange bands   Reps 10-12  10 minutes of strength training     Interval Training   Interval Training No     Oxygen   Oxygen Continuous   Liters 3     Recumbant Bike   Level 3   Minutes 17     NuStep   Level 4   Minutes 17   METs 2.8     Track   Laps 12   Minutes 17       Nutrition:  Target Goals: Understanding of nutrition guidelines, daily intake of sodium <1549m, cholesterol <2031m  calories 30% from fat and 7% or less from saturated fats, daily to have 5 or more servings of fruits and vegetables.  Biometrics:     Pre Biometrics - 03/22/16 1039      Pre Biometrics   Grip Strength 33 kg       Nutrition Therapy Plan and Nutrition Goals:     Nutrition Therapy & Goals - 04/22/16 1409      Nutrition Therapy   Diet Gluten Free, High Calorie, High Protein, Low Sodium     Personal Nutrition Goals   Personal Goal #1 1-2 lb wt gain per week to a wt gain goal of 6-24 lb     Intervention Plan   Intervention Prescribe, educate and counsel regarding individualized specific dietary modifications aiming towards targeted core components such as weight, hypertension, lipid management, diabetes, heart failure and other comorbidities.;Nutrition handout(s) given to patient.  High Calorie, High Protein  diet; Suggestions for Increasing Calories and Protein; and High Calorie, High Protein recipes   Expected Outcomes Short Term Goal: Understand basic principles of dietary content, such as calories, fat, sodium, cholesterol and nutrients.;Long Term Goal: Adherence to prescribed nutrition plan.      Nutrition Discharge: Rate Your Plate Scores:     Nutrition Assessments - 04/08/16 1515      Rate Your Plate Scores   Pre Score 47      Psychosocial: Target Goals: Acknowledge presence or absence of depression, maximize coping skills, provide positive support system. Participant is able to verbalize types and ability to use techniques and skills needed for reducing stress and depression.  Initial Review & Psychosocial Screening:     Initial Psych Review & Screening - 03/22/16 1042      Initial Review   Current issues with --  none at this point     [place]? Yes   Concerns --  none at this point     Barriers   Psychosocial barriers to participate in program There are no identifiable barriers or psychosocial needs.     Screening  Interventions   Interventions Encouraged to exercise      Quality of Life Scores:     Quality of Life - 03/24/16 1519      Quality of Life Scores   Health/Function Pre 20.03 %   Socioeconomic Pre 22.31 %   Psych/Spiritual Pre 20.36 %   Family Pre 22.25 %   GLOBAL Pre 20.9 %      PHQ-9: Recent Review Flowsheet Data    Depression screen [facility] 2/9 03/22/2016 04/16/2015 10/22/2013   Decreased Interest 0 0 0   Down, Depressed, Hopeless 0 0 0   PHQ - 2 Score 0 0 0      Psychosocial Evaluation and Intervention:     Psychosocial Evaluation - 03/22/16 1043      Psychosocial Evaluation & Interventions   Interventions Encouraged to exercise with the program and follow exercise prescription   Continued Psychosocial Services Needed No      Psychosocial Re-Evaluation:     Psychosocial Re-Evaluation    [place] Name 04/05/16 0910 05/04/16 0828           Psychosocial Re-Evaluation   Interventions Encouraged to attend Pulmonary Rehabilitation for the exercise Encouraged to attend Pulmonary Rehabilitation for the exercise      Comments no psycosocial barriers identified at this time no psycosocial barriers identified in the past 30 days        Education: Education Goals: Education classes will be provided on a weekly basis, covering required topics. Participant will state understanding/return demonstration of topics presented.  Learning Barriers/Preferences:     Learning Barriers/Preferences - 03/22/16 1035      Learning Barriers/Preferences   Learning Barriers None   Learning Preferences Group Instruction;Skilled Demonstration      Education Topics: Risk Factor Reduction:  -Group instruction that is supported by a PowerPoint presentation. Instructor discusses the definition of a risk factor, different risk factors for pulmonary disease, and how the heart and lungs work together.     Nutrition for Pulmonary Patient:  -Group instruction provided by PowerPoint slides, verbal  discussion, and written materials to support subject matter. The instructor gives an explanation and review of healthy diet recommendations, which includes a discussion on weight management, recommendations for fruit and vegetable consumption, as well as protein, fluid, caffeine, fiber, sodium, sugar, and alcohol. Tips for eating when patients are short  of breath are discussed. Flowsheet Row PULMONARY REHAB OTHER RESPIRATORY from 04/29/2016 in [place]  Date  04/01/16  Educator  [place]  Instruction Review Code  2- meets goals/outcomes      Pursed Lip Breathing:  -Group instruction that is supported by demonstration and informational handouts. Instructor discusses the benefits of pursed lip and diaphragmatic breathing and detailed demonstration on how to preform both.   Flowsheet Row PULMONARY REHAB OTHER RESPIRATORY from 04/29/2016 in [place]  Date  04/29/16  Educator  RT  Instruction Review Code  2- meets goals/outcomes      Oxygen Safety:  -Group instruction provided by PowerPoint, verbal discussion, and written material to support subject matter. There is an overview of "What is Oxygen" and "Why do we need it".  Instructor also reviews how to create a safe environment for oxygen use, the importance of using oxygen as prescribed, and the risks of noncompliance. There is a brief discussion on traveling with oxygen and resources the patient may utilize. Flowsheet Row PULMONARY REHAB OTHER RESPIRATORY from 04/29/2016 in [place]  Date  04/15/16  Educator  RN  Instruction Review Code  2- meets goals/outcomes      Oxygen Equipment:  -Group instruction provided by [facility] Staff utilizing handouts, written materials, and equipment demonstrations.   Signs and Symptoms:  -Group instruction provided by written material and verbal discussion to support subject matter. Warning signs and symptoms of  infection, stroke, and heart attack are reviewed and when to call the physician/911 reinforced. Tips for preventing the spread of infection discussed.   Advanced Directives:  -Group instruction provided by verbal instruction and written material to support subject matter. Instructor reviews Advanced Directive laws and proper instruction for filling out document.   Pulmonary Video:  -Group video education that reviews the importance of medication and oxygen compliance, exercise, good nutrition, pulmonary hygiene, and pursed lip and diaphragmatic breathing for the pulmonary patient. Flowsheet Row PULMONARY REHAB OTHER RESPIRATORY from 04/29/2016 in [place]  Date  04/22/16  Instruction Review Code  2- meets goals/outcomes      Exercise for the Pulmonary Patient:  -Group instruction that is supported by a PowerPoint presentation. Instructor discusses benefits of exercise, core components of exercise, frequency, duration, and intensity of an exercise routine, importance of utilizing pulse oximetry during exercise, safety while exercising, and options of places to exercise outside of rehab.     Pulmonary Medications:  -Verbally interactive group education provided by instructor with focus on inhaled medications and proper administration.   Anatomy and Physiology of the Respiratory System and Intimacy:  -Group instruction provided by PowerPoint, verbal discussion, and written material to support subject matter. Instructor reviews respiratory cycle and anatomical components of the respiratory system and their functions. Instructor also reviews differences in obstructive and restrictive respiratory diseases with examples of each. Intimacy, Sex, and Sexuality differences are reviewed with a discussion on how relationships can change when diagnosed with pulmonary disease. Common sexual concerns are reviewed.   Knowledge Questionnaire Score:     Knowledge  Questionnaire Score - 03/24/16 1518      Knowledge Questionnaire Score   Pre Score 9/13      Core Components/Risk Factors/Patient Goals at Admission:     Personal Goals and Risk Factors at Admission - 03/22/16 1039      Core Components/Risk Factors/Patient Goals on Admission   Increase Strength and Stamina Yes  Intervention Provide advice, education, support and counseling about physical activity/exercise needs.;Develop an individualized exercise prescription for aerobic and resistive training based on initial evaluation findings, risk stratification, comorbidities and participant's personal goals.   Expected Outcomes Achievement of increased cardiorespiratory fitness and enhanced flexibility, muscular endurance and strength shown through measurements of functional capacity and personal statement of participant.   Improve shortness of breath with ADL's Yes   Intervention Provide education, individualized exercise plan and daily activity instruction to help decrease symptoms of SOB with activities of daily living.   Expected Outcomes Short Term: Achieves a reduction of symptoms when performing activities of daily living.      Core Components/Risk Factors/Patient Goals Review:      Goals and Risk Factor Review    Row Name 03/22/16 1041 04/05/16 0910 05/04/16 0827         Core Components/Risk Factors/Patient Goals Review   Personal Goals Review Increase Strength and Stamina;Improve shortness of breath with ADL's Increase Strength and Stamina;Improve shortness of breath with ADL's Increase Strength and Stamina;Improve shortness of breath with ADL's     Review Exercise will increase strength, stamina, and improve SOB Exercise will increase strength, stamina, and improve SOB see comments section on ITP     Expected Outcomes Exercise will increase strength, stamina, and improve SOB Exercise will increase strength, stamina, and improve SOB see admission expected outcomes        Core  Components/Risk Factors/Patient Goals at Discharge (Final Review):      Goals and Risk Factor Review - 05/04/16 0827      Core Components/Risk Factors/Patient Goals Review   Personal Goals Review Increase Strength and Stamina;Improve shortness of breath with ADL's   Review see comments section on ITP   Expected Outcomes see admission expected outcomes      ITP Comments:   Comments: ITP REVIEW Pt is making expected progress toward pulmonary rehab goals after completing 9 sessions. Recommend continued exercise, life style modification, education, and utilization of breathing techniques to increase stamina and strength and decrease shortness of breath with exertion.

## 2016-05-11 ENCOUNTER — Encounter (HOSPITAL_COMMUNITY)
Admission: RE | Admit: 2016-05-11 | Discharge: 2016-05-11 | Disposition: A | Discharge status: Home / Self Care | Payer: Medicare Other | Source: Ambulatory Visit | Attending: Internal Medicine | Admitting: Internal Medicine

## 2016-05-11 VITALS — Wt 128.7 lb

## 2016-05-11 DIAGNOSIS — I5021 Acute systolic (congestive) heart failure: Secondary | ICD-10-CM

## 2016-05-11 NOTE — Progress Notes (Signed)
Daily Session Note  Patient Details  Name: Bruce Travis MRN: [id number] Date of Birth: 08/18/1935 Referring Provider:   April Manson [facility] Rehab Walk Test from 03/25/2016 in [place]  Referring Provider  Dr. Haroldine Laws      Encounter Date: 05/11/2016  Check In:     Session Check In - 05/11/16 1014      Check-In   Location [facility]   Staff Present Rosebud Poles, RN, BSN;Molly diVincenzo, MS, ACSM RCEP, Exercise Physiologist;Beckett Maden Ysidro Evert, RN;Portia Rollene Rotunda, RN, BSN   Supervising physician immediately available to respond to emergencies [facility] immediately available   Physician(s) Dr. Waldron Labs   Medication changes reported     No   Fall or balance concerns reported    No   Warm-up and Cool-down Performed as group-led instruction   Resistance Training Performed Yes   VAD Patient? No     Pain Assessment   Currently in Pain? No/denies   Multiple Pain Sites No      Capillary Blood Glucose: No results found for this or any previous visit (from the past 24 hour(s)).      Exercise Prescription Changes - 05/11/16 1200      Response to Exercise   Blood Pressure (Admit) 120/60   Blood Pressure (Exercise) 150/70   Blood Pressure (Exit) 122/62   Heart Rate (Admit) 67 bpm   Heart Rate (Exercise) 78 bpm   Heart Rate (Exit) 65 bpm   Oxygen Saturation (Admit) 92 %   Oxygen Saturation (Exercise) 90 %   Oxygen Saturation (Exit) 95 %   Rating of Perceived Exertion (Exercise) 13   Perceived Dyspnea (Exercise) 2   Duration Progress to 45 minutes of aerobic exercise without signs/symptoms of physical distress   Intensity THRR unchanged     Progression   Progression Continue to progress workloads to maintain intensity without signs/symptoms of physical distress.     Resistance Training   Training Prescription Yes   Weight orange bands   Reps 10-12  10 minutes of strength training     Interval Training   Interval  Training No     Oxygen   Oxygen Continuous   Liters 3     Recumbant Bike   Level 3   Minutes 17     NuStep   Level 4   Minutes 17   METs 2.4     Track   Laps 16   Minutes 17     Goals Met:  Exercise tolerated well No report of cardiac concerns or symptoms Strength training completed today  Goals Unmet:  Not Applicable  Comments: Service time is from 1030 to 1210    Dr. Rush Farmer is Medical Director for Pulmonary Rehab at [facility].

## 2016-05-13 ENCOUNTER — Encounter (HOSPITAL_COMMUNITY)
Admission: RE | Admit: 2016-05-13 | Discharge: 2016-05-13 | Disposition: A | Discharge status: Home / Self Care | Payer: Medicare Other | Source: Ambulatory Visit | Attending: Internal Medicine | Admitting: Internal Medicine

## 2016-05-13 VITALS — Wt 127.4 lb

## 2016-05-13 DIAGNOSIS — I5021 Acute systolic (congestive) heart failure: Secondary | ICD-10-CM

## 2016-05-13 NOTE — Progress Notes (Signed)
Daily Session Note  Patient Details  Name: Bruce Travis MRN: [id number] Date of Birth: 04-01-1935 Referring Provider:   April Manson [facility] Rehab Walk Test from 03/25/2016 in [place]  Referring Provider  Dr. Haroldine Laws      Encounter Date: 05/13/2016  Check In:     Session Check In - 05/13/16 1021      Check-In   Location [facility]   Staff Present Su Hilt, MS, ACSM RCEP, Exercise Physiologist;Joan Leonia Reeves, RN, Luisa Hart, RN, Roque Cash, RN   Supervising physician immediately available to respond to emergencies [facility] immediately available   Physician(s) Dr. Dyann Kief   Medication changes reported     No   Fall or balance concerns reported    No   Warm-up and Cool-down Performed as group-led instruction   Resistance Training Performed Yes   VAD Patient? No     Pain Assessment   Currently in Pain? No/denies   Multiple Pain Sites No      Capillary Blood Glucose: No results found for this or any previous visit (from the past 24 hour(s)).      Exercise Prescription Changes - 05/13/16 1300      Response to Exercise   Blood Pressure (Admit) 126/70   Blood Pressure (Exit) 118/62   Heart Rate (Admit) 65 bpm   Heart Rate (Exercise) 70 bpm   Heart Rate (Exit) 59 bpm   Oxygen Saturation (Admit) 94 %   Oxygen Saturation (Exercise) 93 %   Oxygen Saturation (Exit) 97 %   Rating of Perceived Exertion (Exercise) 13   Perceived Dyspnea (Exercise) 2   Duration Progress to 45 minutes of aerobic exercise without signs/symptoms of physical distress   Intensity THRR unchanged     Progression   Progression Continue to progress workloads to maintain intensity without signs/symptoms of physical distress.     Resistance Training   Training Prescription Yes   Weight orange bands   Reps 10-12  10 minutes of strength training     Interval Training   Interval Training No     Oxygen   Oxygen Continuous    Liters 3     Recumbant Bike   Level 3   Minutes 17     NuStep   Level 4   Minutes 17   METs 2.4     Goals Met:  Exercise tolerated well No report of cardiac concerns or symptoms Strength training completed today  Goals Unmet:  Not Applicable  Comments: Service time is from 1030 to 1225    Dr. Rush Farmer is Medical Director for Pulmonary Rehab at [facility].

## 2016-05-18 ENCOUNTER — Encounter (HOSPITAL_COMMUNITY)
Admission: RE | Admit: 2016-05-18 | Discharge: 2016-05-18 | Disposition: A | Discharge status: Home / Self Care | Payer: Medicare Other | Source: Ambulatory Visit | Attending: Internal Medicine | Admitting: Internal Medicine

## 2016-05-18 VITALS — Wt 125.9 lb

## 2016-05-18 DIAGNOSIS — I5021 Acute systolic (congestive) heart failure: Secondary | ICD-10-CM | POA: Diagnosis not present

## 2016-05-18 NOTE — Progress Notes (Signed)
Daily Session Note  Patient Details  Name: Bruce Travis MRN: [id number] Date of Birth: 1934-10-24 Referring Provider:   April Manson [facility] Rehab Walk Test from 03/25/2016 in [place]  Referring Provider  Dr. Haroldine Laws      Encounter Date: 05/18/2016  Check In:     Session Check In - 05/18/16 1017      Check-In   Location [facility]   Staff Present Su Hilt, MS, ACSM RCEP, Exercise Physiologist;Portia Rollene Rotunda, RN, Maxcine Ham, RN, Roque Cash, RN   Supervising physician immediately available to respond to emergencies [facility] immediately available   Physician(s) Dr. Dyann Kief   Medication changes reported     No   Fall or balance concerns reported    No   Warm-up and Cool-down Performed as group-led instruction   Resistance Training Performed Yes   VAD Patient? No     Pain Assessment   Currently in Pain? No/denies   Multiple Pain Sites No      Capillary Blood Glucose: No results found for this or any previous visit (from the past 24 hour(s)).      Exercise Prescription Changes - 05/18/16 1200      Response to Exercise   Blood Pressure (Admit) 110/56   Blood Pressure (Exercise) 122/66   Blood Pressure (Exit) 106/60   Heart Rate (Admit) 59 bpm   Heart Rate (Exercise) 86 bpm   Heart Rate (Exit) 62 bpm   Oxygen Saturation (Admit) 94 %   Oxygen Saturation (Exercise) 89 %   Oxygen Saturation (Exit) 93 %   Rating of Perceived Exertion (Exercise) 13   Perceived Dyspnea (Exercise) 2   Duration Progress to 45 minutes of aerobic exercise without signs/symptoms of physical distress   Intensity THRR unchanged     Progression   Progression Continue to progress workloads to maintain intensity without signs/symptoms of physical distress.     Resistance Training   Training Prescription Yes   Weight orange bands   Reps 10-12  10 minutes of strength training     Interval Training   Interval Training  No     Oxygen   Oxygen Continuous   Liters 3     Recumbant Bike   Level 3   Minutes 17     NuStep   Level 4   Minutes 17   METs 2.2     Track   Laps 14   Minutes 17     Goals Met:  Exercise tolerated well No report of cardiac concerns or symptoms Strength training completed today  Goals Unmet:  Not Applicable  Comments: Service time is from 1030 to 1210    Dr. Rush Farmer is Medical Director for Pulmonary Rehab at [facility].

## 2016-05-19 ENCOUNTER — Ambulatory Visit (INDEPENDENT_AMBULATORY_CARE_PROVIDER_SITE_OTHER): Payer: Medicare Other | Admitting: Family Medicine

## 2016-05-19 DIAGNOSIS — Z23 Encounter for immunization: Secondary | ICD-10-CM

## 2016-05-20 ENCOUNTER — Encounter (HOSPITAL_COMMUNITY): Payer: Medicare Other

## 2016-05-25 ENCOUNTER — Encounter (HOSPITAL_COMMUNITY)
Admission: RE | Admit: 2016-05-25 | Discharge: 2016-05-25 | Disposition: A | Discharge status: Home / Self Care | Payer: Medicare Other | Source: Ambulatory Visit | Attending: Internal Medicine | Admitting: Internal Medicine

## 2016-05-25 VITALS — Wt 129.9 lb

## 2016-05-25 DIAGNOSIS — I5021 Acute systolic (congestive) heart failure: Secondary | ICD-10-CM | POA: Diagnosis not present

## 2016-05-25 NOTE — Progress Notes (Signed)
Daily Session Note  Patient Details  Name: Bruce Travis MRN: [id number] Date of Birth: August 09, 1935 Referring Provider:   April Manson [facility]monary Rehab Walk Test from 03/25/2016 in [place]  Referring Provider  Dr. Haroldine Laws      Encounter Date: 05/25/2016  Check In:     Session Check In - 05/25/16 1002      Check-In   Location [facility]   Staff Present Su Hilt, MS, ACSM RCEP, Exercise Physiologist;Sabino Denning Leonia Reeves, RN, BSN;Lisa Hughes, RN;Portia Rollene Rotunda, RN, BSN   Supervising physician immediately available to respond to emergencies [facility] immediately available   Physician(s) Dr. Maylene Roes   Medication changes reported     No   Fall or balance concerns reported    No   Warm-up and Cool-down Performed as group-led instruction   Resistance Training Performed Yes   VAD Patient? No     Pain Assessment   Currently in Pain? No/denies   Multiple Pain Sites No      Capillary Blood Glucose: No results found for this or any previous visit (from the past 24 hour(s)).      Exercise Prescription Changes - 05/25/16 1200      Exercise Review   Progression Yes     Response to Exercise   Blood Pressure (Admit) 122/70   Blood Pressure (Exercise) 106/66   Blood Pressure (Exit) 130/80   Heart Rate (Admit) 63 bpm   Heart Rate (Exercise) 82 bpm   Heart Rate (Exit) 64 bpm   Oxygen Saturation (Admit) 93 %   Oxygen Saturation (Exercise) 88 %   Oxygen Saturation (Exit) 94 %   Rating of Perceived Exertion (Exercise) 13   Perceived Dyspnea (Exercise) 2   Duration Progress to 45 minutes of aerobic exercise without signs/symptoms of physical distress   Intensity THRR unchanged     Progression   Progression Continue to progress workloads to maintain intensity without signs/symptoms of physical distress.     Resistance Training   Training Prescription Yes   Weight orange bands   Reps 10-12  10 minutes of strength training     Interval Training   Interval Training No     Recumbant Bike   Level 4   Minutes 17     NuStep   Level 4   Minutes 17   METs 2.2     Track   Laps 16   Minutes 17     Goals Met:  Exercise tolerated well Strength training completed today  Goals Unmet:  Not Applicable  Comments: Service time is from 1030 to 1210    Dr. Rush Farmer is Medical Director for Pulmonary Rehab at [facility].

## 2016-05-27 ENCOUNTER — Encounter (HOSPITAL_COMMUNITY)
Admission: RE | Admit: 2016-05-27 | Discharge: 2016-05-27 | Disposition: A | Discharge status: Home / Self Care | Payer: Medicare Other | Source: Ambulatory Visit | Attending: Internal Medicine | Admitting: Internal Medicine

## 2016-05-27 VITALS — Wt 128.7 lb

## 2016-05-27 DIAGNOSIS — I5021 Acute systolic (congestive) heart failure: Secondary | ICD-10-CM | POA: Diagnosis not present

## 2016-05-27 NOTE — Progress Notes (Signed)
Daily Session Note  Patient Details  Name: Bruce Travis MRN: [id number] Date of Birth: 10/05/1934 Referring Provider:   April Manson Pulmonary Rehab Walk Test from 03/25/2016 in [place]  Referring Provider  Dr. Haroldine Laws      Encounter Date: 05/27/2016  Check In:     Session Check In - 05/27/16 1020      Check-In   Staff Present Su Hilt, MS, ACSM RCEP, Exercise Physiologist;Joan Leonia Reeves, RN, BSN;Nusayba Cadenas, RN;Portia Rollene Rotunda, RN, BSN   Supervising physician immediately available to respond to emergencies [facility] immediately available   Physician(s) Dr. [place] Pink   Medication changes reported     No   Warm-up and Cool-down Performed as group-led instruction   Resistance Training Performed Yes   VAD Patient? No     Pain Assessment   Currently in Pain? No/denies   Multiple Pain Sites No      Capillary Blood Glucose: No results found for this or any previous visit (from the past 24 hour(s)).      Exercise Prescription Changes - 05/27/16 1200      Response to Exercise   Blood Pressure (Admit) 106/54   Blood Pressure (Exercise) 126/64   Blood Pressure (Exit) 120/70   Heart Rate (Admit) 61 bpm   Heart Rate (Exercise) 63 bpm   Heart Rate (Exit) 77 bpm   Oxygen Saturation (Admit) 96 %   Oxygen Saturation (Exercise) 91 %   Oxygen Saturation (Exit) 96 %   Rating of Perceived Exertion (Exercise) 13   Perceived Dyspnea (Exercise) 1   Duration Progress to 45 minutes of aerobic exercise without signs/symptoms of physical distress   Intensity THRR unchanged     Progression   Progression Continue to progress workloads to maintain intensity without signs/symptoms of physical distress.     Resistance Training   Training Prescription Yes   Weight orange bands   Reps 10-12  10 minutes of strength training     Interval Training   Interval Training No     Oxygen   Oxygen Continuous   Liters 3     Recumbant Bike   Level 4    Minutes 17     NuStep   Level 4   Minutes 17   METs 2.4     Goals Met:  Exercise tolerated well No report of cardiac concerns or symptoms Strength training completed today  Goals Unmet:  Not Applicable  Comments: Service time is from 1030 to 1220    Dr. Rush Farmer is Medical Director for Pulmonary Rehab at [facility].

## 2016-06-01 ENCOUNTER — Encounter (HOSPITAL_COMMUNITY)
Admission: RE | Admit: 2016-06-01 | Discharge: 2016-06-01 | Disposition: A | Discharge status: Home / Self Care | Payer: Medicare Other | Source: Ambulatory Visit | Attending: Internal Medicine | Admitting: Internal Medicine

## 2016-06-01 VITALS — Wt 127.9 lb

## 2016-06-01 DIAGNOSIS — I5021 Acute systolic (congestive) heart failure: Secondary | ICD-10-CM

## 2016-06-01 NOTE — Progress Notes (Signed)
Pulmonary Individual Treatment Plan  Patient Details  Name: Bruce Travis MRN: [id number] Date of Birth: 07-27-1935 Referring Provider:   April Manson Pulmonary Rehab Walk Test from 03/25/2016 in [place]  Referring Provider  Dr. Haroldine Laws      Initial Encounter Date:  Flowsheet Row Pulmonary Rehab Walk Test from 03/25/2016 in [place]  Date  03/25/16  Referring Provider  Dr. Haroldine Laws      Visit Diagnosis: Acute systolic heart failure ([place])  Patient's Home Medications on Admission:   Current Outpatient Prescriptions:  .  AMBULATORY NON FORMULARY MEDICATION, O2 2 Lpm continuous, Disp: , Rfl:  .  amiodarone (PACERONE) 200 MG tablet, Take 1 tablet (200 mg total) by mouth daily., Disp: 30 tablet, Rfl: 6 .  aspirin EC 81 MG tablet, Take 81 mg by mouth daily., Disp: , Rfl:  .  azelastine (ASTELIN) 0.1 % nasal spray, Place 2 sprays into both nostrils 2 (two) times daily as needed for rhinitis or allergies. , Disp: , Rfl:  .  montelukast (SINGULAIR) 10 MG tablet, TAKE 1 TABLET BY MOUTH DAILY., Disp: 90 tablet, Rfl: 0 .  Multiple Vitamin (MULTIVITAMIN WITH MINERALS) TABS tablet, Take 1 tablet by mouth daily., Disp: , Rfl:  .  pantoprazole (PROTONIX) 40 MG tablet, TAKE ONE TABLET BY MOUTH DAILY., Disp: 30 tablet, Rfl: 0 .  polyethylene glycol (MIRALAX / GLYCOLAX) packet, Take 17 g by mouth daily as needed for mild constipation. , Disp: , Rfl:  .  PROAIR HFA 108 (90 BASE) MCG/ACT inhaler, INHALE 2 PUFFS EVERY 4 HOURS AS NEEDED FOR SHORTNESS OF BREATH., Disp: 8.5 g, Rfl: 5 .  sacubitril-valsartan (ENTRESTO) 49-51 MG, Take 1 tablet by mouth 2 (two) times daily., Disp: 60 tablet, Rfl: 6 .  SPIRIVA HANDIHALER 18 MCG inhalation capsule, INHALE 1 CAPSULE ONCE DAILY AS DIRECTED, Disp: 30 capsule, Rfl: 11 .  spironolactone (ALDACTONE) 25 MG tablet, Take 0.5 tablets (12.5 mg total) by mouth daily., Disp: 16 tablet, Rfl: 6 .  SYMBICORT  160-4.5 MCG/ACT inhaler, INHALE 2 PUFFS FIRST THING IN MORNING AND THEN ANOTHER 2 PUFFS ABOUT 12 HOURS LATER., Disp: 10.2 g, Rfl: 11  Past Medical History: Past Medical History:  Diagnosis Date  . Allergic rhinitis   . Bronchiectasis   . CAD (coronary artery disease)   . Celiac disease   . Chronic heart failure ([place])   . Chronic kidney disease (CKD), stage III (moderate)   . COPD (chronic obstructive pulmonary disease) ([place])   . Diverticulosis of colon (without mention of hemorrhage)   . Esophageal reflux   . Family history of adverse reaction to anesthesia    daughter gets PONV  . Family hx of colon cancer   . History of stomach ulcers 1980s  . Hyperlipemia   . Hypertension   . 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 Myocardial infarction    "previous MI/echo in 12/2015"  . Nephrolithiasis    "got them now; never had OR/scopes" (02/03/2016)  . On home oxygen therapy    "2L; 24/7" (02/03/2016)  . Other diseases of lung, not elsewhere classified   . Personal history of colonic polyps 04/26/2007   hyperplastic   . Pneumonia "several times"    Tobacco Use: History  Smoking Status  . Former Smoker  . Packs/day: 2.00  . Years: 35.00  . Types: Cigarettes  . Quit date: 01/21/1989  Smokeless Tobacco  . Former Systems developer  .  Types: Chew  . Quit date: 08/23/2001    Labs: Recent Review Flowsheet Data    Labs for ITP Cardiac and Pulmonary Rehab Latest Ref Rng & Units 02/03/2016 02/04/2016 02/05/2016 02/06/2016 03/09/2016   Cholestrol 125 - 200 mg/dL - - - - 208(H)   LDLCALC <130 mg/dL - - - - 132(H)   HDL >=40 mg/dL - - - - 33(L)   Trlycerides <150 mg/dL - - - - 217(H)   Hemoglobin A1c 4.8 - 5.6 % - - 5.6 - -   PHART 7.350 - 7.450 7.375 - - - -   PCO2ART 35.0 - 45.0 mmHg 37.8 - - - -   HCO3 20.0 - 24.0 mEq/L 22.1 - - - -   TCO2 0 - 100 mmol/L 23 - - - -   ACIDBASEDEF 0.0 - 2.0 mmol/L 3.0(H) - - - -   O2SAT % 91.0 67.0 80.1 72.0 -      Capillary Blood  Glucose: No results found for: GLUCAP   ADL UCSD:   Pulmonary Function Assessment:     Pulmonary Function Assessment - 03/22/16 1036      Breath   Bilateral Breath Sounds Clear   Shortness of Breath Yes;Limiting activity      Exercise Target Goals:    Exercise Program Goal: Individual exercise prescription set with THRR, safety & activity barriers. Participant demonstrates ability to understand and report RPE using BORG scale, to self-measure pulse accurately, and to acknowledge the importance of the exercise prescription.  Exercise Prescription Goal: Starting with aerobic activity 30 plus minutes a day, 3 days per week for initial exercise prescription. Provide home exercise prescription and guidelines that participant acknowledges understanding prior to discharge.  Activity Barriers & Risk Stratification:     Activity Barriers & Cardiac Risk Stratification - 03/22/16 1034      Activity Barriers & Cardiac Risk Stratification   Activity Barriers None      6 Minute Walk:     6 Minute Walk    Row Name 03/25/16 1633         6 Minute Walk   Phase Initial     Distance 1175 feet     Walk Time 6 minutes     # of Rest Breaks 0     MPH 2.22     METS 2.68     RPE 13     Perceived Dyspnea  3     Symptoms No     Resting HR 65 bpm     Resting BP 162/80     Max Ex. HR 78 bpm     Max Ex. BP 158/82       Interval Oxygen   Interval Oxygen? Yes     Baseline Oxygen Saturation % 65 %     Baseline Liters of Oxygen 2 L     1 Minute Oxygen Saturation % 93 %     1 Minute Liters of Oxygen 2 L     2 Minute Oxygen Saturation % 92 %     2 Minute Liters of Oxygen 2 L     3 Minute Oxygen Saturation % 88 %     3 Minute Liters of Oxygen 2 L     4 Minute Oxygen Saturation % 88 %     4 Minute Liters of Oxygen 2 L     5 Minute Oxygen Saturation % 87 %     5 Minute Liters of Oxygen 3 L     6  Minute Oxygen Saturation % 86 %     6 Minute Liters of Oxygen 3 L     2 Minute Post  Oxygen Saturation % 91 %     2 Minute Post Liters of Oxygen 3 L        Initial Exercise Prescription:     Initial Exercise Prescription - 03/25/16 1600      Date of Initial Exercise RX and Referring Provider   Date 03/25/16   Referring Provider Dr. Haroldine Laws     Oxygen   Oxygen Continuous   Liters 3     Bike   Level 0.5   Minutes 17     NuStep   Level 1   Minutes 17   METs 1.7     Track   Laps 6   Minutes 17     Prescription Details   Frequency (times per week) 2   Duration Progress to 45 minutes of aerobic exercise without signs/symptoms of physical distress     Intensity   THRR 40-80% of Max Heartrate 56-113   Ratings of Perceived Exertion 11-13   Perceived Dyspnea 0-4     Progression   Progression Continue progressive overload as per policy without signs/symptoms or physical distress.     Resistance Training   Training Prescription Yes   Weight orange    Reps 10-12      Perform Capillary Blood Glucose checks as needed.  Exercise Prescription Changes:     Exercise Prescription Changes    Row Name 04/01/16 1200 04/06/16 1200 04/13/16 1200 04/15/16 1200 04/20/16 1217     Exercise Review   Progression  -  - Yes Yes Yes     Response to Exercise   Blood Pressure (Admit) 146/84 122/60 150/68 118/60 132/70   Blood Pressure (Exercise) 150/70 142/90 142/86 132/74 98/60   Blood Pressure (Exit) 140/66 118/60 110/70 142/82 122/64   Heart Rate (Admit) 66 bpm 65 bpm 66 bpm 64 bpm 65 bpm   Heart Rate (Exercise) 89 bpm 69 bpm 83 bpm 78 bpm 78 bpm   Heart Rate (Exit) 67 bpm 71 bpm 74 bpm 63 bpm 67 bpm   Oxygen Saturation (Admit) 94 % 93 % 94 % 93 % 94 %   Oxygen Saturation (Exercise) 89 % 88 % 86 % 88 % 87 %   Oxygen Saturation (Exit) 95 % 94 % 93 % 95 % 93 %   Rating of Perceived Exertion (Exercise) 13 13 15 13 15    Perceived Dyspnea (Exercise) 1 2 2 1 3    Duration Progress to 45 minutes of aerobic exercise without signs/symptoms of physical distress  Progress to 45 minutes of aerobic exercise without signs/symptoms of physical distress Progress to 45 minutes of aerobic exercise without signs/symptoms of physical distress Progress to 45 minutes of aerobic exercise without signs/symptoms of physical distress Progress to 45 minutes of aerobic exercise without signs/symptoms of physical distress   Intensity THRR unchanged THRR unchanged THRR unchanged THRR unchanged THRR unchanged     Progression   Progression Continue progressive overload as per policy without signs/symptoms or physical distress. Continue progressive overload as per policy without signs/symptoms or physical distress. Continue progressive overload as per policy without signs/symptoms or physical distress. Continue to progress workloads to maintain intensity without signs/symptoms of physical distress. Continue to progress workloads to maintain intensity without signs/symptoms of physical distress.     Resistance Training   Training Prescription Yes Yes Yes Yes Yes   Weight orange bands orange bands  orange bands orange bands orange bands   Reps 10-12 10-12 10-12 10-12  10 minutes of strength training 10-12  10 minutes of strength training     Interval Training   Interval Training No No No No No     Oxygen   Oxygen Continuous Continuous Continuous Continuous Continuous   Liters 3 3 3 3 3      Bike   Level 0.5 2 3   - 3   Minutes 17 17 17   - 17     NuStep   Level  - 1 2 3 4    Minutes  - 17 17 17 17    METs  - 2.8 1.8 2.2 2.4     Track   Laps 4 12 14 15 15    Minutes 17 17 17 17 17      Home Exercise Plan   Plans to continue exercise at  -  -  - Home Home   Frequency  -  -  - Add 3 additional days to program exercise sessions. Add 3 additional days to program exercise sessions.   [place] Name 04/22/16 1200 04/27/16 1246 04/29/16 1200 05/04/16 1200 05/06/16 1200     Response to Exercise   Blood Pressure (Admit) 126/60 118/62 98/50 110/58 100/60   Blood Pressure (Exercise)  136/70 126/60 128/70 120/60 126/80   Blood Pressure (Exit) 134/74 124/70 100/60 118/60 126/72   Heart Rate (Admit) 61 bpm 65 bpm 61 bpm 64 bpm 61 bpm   Heart Rate (Exercise) 79 bpm 78 bpm 73 bpm 84 bpm 91 bpm   Heart Rate (Exit) 66 bpm 64 bpm 69 bpm 70 bpm 68 bpm   Oxygen Saturation (Admit) 93 % 94 % 95 % 93 % 95 %   Oxygen Saturation (Exercise) 89 % 87 % 85 % 88 % 91 %   Oxygen Saturation (Exit) 95 % 96 % 96 % 93 % 92 %   Rating of Perceived Exertion (Exercise) 13 13 13 15 13    Perceived Dyspnea (Exercise) 0 1 2 2 2    Duration Progress to 45 minutes of aerobic exercise without signs/symptoms of physical distress Progress to 45 minutes of aerobic exercise without signs/symptoms of physical distress Progress to 45 minutes of aerobic exercise without signs/symptoms of physical distress Progress to 45 minutes of aerobic exercise without signs/symptoms of physical distress Progress to 45 minutes of aerobic exercise without signs/symptoms of physical distress   Intensity THRR unchanged THRR unchanged THRR unchanged THRR unchanged THRR unchanged     Progression   Progression Continue to progress workloads to maintain intensity without signs/symptoms of physical distress. Continue to progress workloads to maintain intensity without signs/symptoms of physical distress. Continue to progress workloads to maintain intensity without signs/symptoms of physical distress. Continue to progress workloads to maintain intensity without signs/symptoms of physical distress. Continue to progress workloads to maintain intensity without signs/symptoms of physical distress.     Resistance Training   Training Prescription Yes Yes Yes Yes Yes   Weight orange bands orange bands orange bands orange bands orange bands   Reps 10-12  10 minutes of strength training 10-12  10 minutes of strength training 10-12  10 minutes of strength training 10-12  10 minutes of strength training 10-12  10 minutes of strength training      Interval Training   Interval Training No No No No No     Oxygen   Oxygen Continuous Continuous Continuous Continuous Continuous   Liters 3 3 3 3   -  Recumbant Bike   Level 3 3 3 3 3    Minutes 14 17 17 17 17      NuStep   Level 4 4 4 4 4    Minutes 17 17 17 17 17    METs 2.4 2.5 2.5 2.8 2.3     Track   Laps  - 14 15 12   -   Minutes  - 17 17 17   -     Home Exercise Plan   Plans to continue exercise at [place] 3 additional days to program exercise sessions.  -  -  -  -   Row Name 05/11/16 1200 05/13/16 1300 05/18/16 1200 05/25/16 1200 05/27/16 1200     Exercise Review   Progression  -  -  - Yes  -     Response to Exercise   Blood Pressure (Admit) 120/60 126/70 110/56 122/70 106/54   Blood Pressure (Exercise) 150/70  - 122/66 106/66 126/64   Blood Pressure (Exit) 122/62 118/62 106/60 130/80 120/70   Heart Rate (Admit) 67 bpm 65 bpm 59 bpm 63 bpm 61 bpm   Heart Rate (Exercise) 78 bpm 70 bpm 86 bpm 82 bpm 63 bpm   Heart Rate (Exit) 65 bpm 59 bpm 62 bpm 64 bpm 77 bpm   Oxygen Saturation (Admit) 92 % 94 % 94 % 93 % 96 %   Oxygen Saturation (Exercise) 90 % 93 % 89 % 88 % 91 %   Oxygen Saturation (Exit) 95 % 97 % 93 % 94 % 96 %   Rating of Perceived Exertion (Exercise) 13 13 13 13 13    Perceived Dyspnea (Exercise) 2 2 2 2 1    Duration Progress to 45 minutes of aerobic exercise without signs/symptoms of physical distress Progress to 45 minutes of aerobic exercise without signs/symptoms of physical distress Progress to 45 minutes of aerobic exercise without signs/symptoms of physical distress Progress to 45 minutes of aerobic exercise without signs/symptoms of physical distress Progress to 45 minutes of aerobic exercise without signs/symptoms of physical distress   Intensity THRR unchanged THRR unchanged THRR unchanged THRR unchanged THRR unchanged     Progression   Progression Continue to progress workloads to maintain intensity without signs/symptoms of  physical distress. Continue to progress workloads to maintain intensity without signs/symptoms of physical distress. Continue to progress workloads to maintain intensity without signs/symptoms of physical distress. Continue to progress workloads to maintain intensity without signs/symptoms of physical distress. Continue to progress workloads to maintain intensity without signs/symptoms of physical distress.     Resistance Training   Training Prescription Yes Yes Yes Yes Yes   Weight orange bands orange bands orange bands orange bands orange bands   Reps 10-12  10 minutes of strength training 10-12  10 minutes of strength training 10-12  10 minutes of strength training 10-12  10 minutes of strength training 10-12  10 minutes of strength training     Interval Training   Interval Training No No No No No     Oxygen   Oxygen Continuous Continuous Continuous  - Continuous   Liters 3 3 3   - 3     Recumbant Bike   Level 3 3 3 4 4    Minutes 17 17 17 17 17      NuStep   Level 4 4 4 4 4    Minutes 17 17 17 17 17    METs 2.4 2.4 2.2 2.2 2.4  Track   Laps 16  - 14 16  -   Minutes 17  - 17 17  -      Exercise Comments:     Exercise Comments    Row Name 04/06/16 0753 04/15/16 1328 05/03/16 1151 05/31/16 0938     Exercise Comments Patient has only attended one exercise session. Will cont. to monitor.  Home exercise completed today Patient is steadily progressing in workload intensities. He is up to 15 laps on the walking track. Will cont. to monitor.  Patient consistently works hard and is open to workload progression. Will cont. to monitor.        Discharge Exercise Prescription (Final Exercise Prescription Changes):     Exercise Prescription Changes - 05/27/16 1200      Response to Exercise   Blood Pressure (Admit) 106/54   Blood Pressure (Exercise) 126/64   Blood Pressure (Exit) 120/70   Heart Rate (Admit) 61 bpm   Heart Rate (Exercise) 63 bpm   Heart Rate (Exit) 77 bpm    Oxygen Saturation (Admit) 96 %   Oxygen Saturation (Exercise) 91 %   Oxygen Saturation (Exit) 96 %   Rating of Perceived Exertion (Exercise) 13   Perceived Dyspnea (Exercise) 1   Duration Progress to 45 minutes of aerobic exercise without signs/symptoms of physical distress   Intensity THRR unchanged     Progression   Progression Continue to progress workloads to maintain intensity without signs/symptoms of physical distress.     Resistance Training   Training Prescription Yes   Weight orange bands   Reps 10-12  10 minutes of strength training     Interval Training   Interval Training No     Oxygen   Oxygen Continuous   Liters 3     Recumbant Bike   Level 4   Minutes 17     NuStep   Level 4   Minutes 17   METs 2.4       Nutrition:  Target Goals: Understanding of nutrition guidelines, daily intake of sodium <1594m, cholesterol <208m calories 30% from fat and 7% or less from saturated fats, daily to have 5 or more servings of fruits and vegetables.  Biometrics:     Pre Biometrics - 03/22/16 1039      Pre Biometrics   Grip Strength 33 kg       Nutrition Therapy Plan and Nutrition Goals:     Nutrition Therapy & Goals - 04/22/16 1409      Nutrition Therapy   Diet Gluten Free, High Calorie, High Protein, Low Sodium     Personal Nutrition Goals   Personal Goal #1 1-2 lb wt gain per week to a wt gain goal of 6-24 lb     Intervention Plan   Intervention Prescribe, educate and counsel regarding individualized specific dietary modifications aiming towards targeted core components such as weight, hypertension, lipid management, diabetes, heart failure and other comorbidities.;Nutrition handout(s) given to patient.  High Calorie, High Protein diet; Suggestions for Increasing Calories and Protein; and High Calorie, High Protein recipes   Expected Outcomes Short Term Goal: Understand basic principles of dietary content, such as calories, fat, sodium, cholesterol  and nutrients.;Long Term Goal: Adherence to prescribed nutrition plan.      Nutrition Discharge: Rate Your Plate Scores:     Nutrition Assessments - 04/08/16 1515      Rate Your Plate Scores   Pre Score 47      Psychosocial: Target Goals: Acknowledge presence or absence of  depression, maximize coping skills, provide positive support system. Participant is able to verbalize types and ability to use techniques and skills needed for reducing stress and depression.  Initial Review & Psychosocial Screening:     Initial Psych Review & Screening - 03/22/16 1042      Initial Review   Current issues with --  none at this point     [place]? Yes   Concerns --  none at this point     Barriers   Psychosocial barriers to participate in program There are no identifiable barriers or psychosocial needs.     Screening Interventions   Interventions Encouraged to exercise      Quality of Life Scores:   PHQ-9: Recent Review Flowsheet Data    Depression screen [facility] 2/9 03/22/2016 04/16/2015 10/22/2013   Decreased Interest 0 0 0   Down, Depressed, Hopeless 0 0 0   PHQ - 2 Score 0 0 0      Psychosocial Evaluation and Intervention:     Psychosocial Evaluation - 03/22/16 1043      Psychosocial Evaluation & Interventions   Interventions Encouraged to exercise with the program and follow exercise prescription   Continued Psychosocial Services Needed No      Psychosocial Re-Evaluation:     Psychosocial Re-Evaluation    Row Name 04/05/16 0910 05/04/16 0828 06/01/16 0801         Psychosocial Re-Evaluation   Interventions Encouraged to attend Pulmonary Rehabilitation for the exercise Encouraged to attend Pulmonary Rehabilitation for the exercise Encouraged to attend Pulmonary Rehabilitation for the exercise     Comments no psycosocial barriers identified at this time no psycosocial barriers identified in the past 30 days no psycosocial barriers  identified in the past 30 days       Education: Education Goals: Education classes will be provided on a weekly basis, covering required topics. Participant will state understanding/return demonstration of topics presented.  Learning Barriers/Preferences:     Learning Barriers/Preferences - 03/22/16 1035      Learning Barriers/Preferences   Learning Barriers None   Learning Preferences Group Instruction;Skilled Demonstration      Education Topics: Risk Factor Reduction:  -Group instruction that is supported by a PowerPoint presentation. Instructor discusses the definition of a risk factor, different risk factors for pulmonary disease, and how the heart and lungs work together.     Nutrition for Pulmonary Patient:  -Group instruction provided by PowerPoint slides, verbal discussion, and written materials to support subject matter. The instructor gives an explanation and review of healthy diet recommendations, which includes a discussion on weight management, recommendations for fruit and vegetable consumption, as well as protein, fluid, caffeine, fiber, sodium, sugar, and alcohol. Tips for eating when patients are short of breath are discussed. Flowsheet Row PULMONARY REHAB OTHER RESPIRATORY from 05/27/2016 in [place]  Date  04/01/16  Educator  [place]na  Instruction Review Code  2- meets goals/outcomes      Pursed Lip Breathing:  -Group instruction that is supported by demonstration and informational handouts. Instructor discusses the benefits of pursed lip and diaphragmatic breathing and detailed demonstration on how to preform both.   Flowsheet Row PULMONARY REHAB OTHER RESPIRATORY from 05/27/2016 in [place]  Date  04/29/16  Educator  RT  Instruction Review Code  2- meets goals/outcomes      Oxygen Safety:  -Group instruction provided by PowerPoint, verbal discussion, and written material to support subject  matter. There is an overview of "What is Oxygen" and "Why do we need it".  Instructor also reviews how to create a safe environment for oxygen use, the importance of using oxygen as prescribed, and the risks of noncompliance. There is a brief discussion on traveling with oxygen and resources the patient may utilize. Flowsheet Row PULMONARY REHAB OTHER RESPIRATORY from 05/27/2016 in [place]  Date  04/15/16  Educator  RN  Instruction Review Code  2- meets goals/outcomes      Oxygen Equipment:  -Group instruction provided by [facility] Staff utilizing handouts, written materials, and equipment demonstrations.   Signs and Symptoms:  -Group instruction provided by written material and verbal discussion to support subject matter. Warning signs and symptoms of infection, stroke, and heart attack are reviewed and when to call the physician/911 reinforced. Tips for preventing the spread of infection discussed.   Advanced Directives:  -Group instruction provided by verbal instruction and written material to support subject matter. Instructor reviews Advanced Directive laws and proper instruction for filling out document.   Pulmonary Video:  -Group video education that reviews the importance of medication and oxygen compliance, exercise, good nutrition, pulmonary hygiene, and pursed lip and diaphragmatic breathing for the pulmonary patient. Flowsheet Row PULMONARY REHAB OTHER RESPIRATORY from 05/27/2016 in [place]  Date  04/22/16  Instruction Review Code  2- meets goals/outcomes      Exercise for the Pulmonary Patient:  -Group instruction that is supported by a PowerPoint presentation. Instructor discusses benefits of exercise, core components of exercise, frequency, duration, and intensity of an exercise routine, importance of utilizing pulse oximetry during exercise, safety while exercising, and options of places to exercise  outside of rehab.     Pulmonary Medications:  -Verbally interactive group education provided by instructor with focus on inhaled medications and proper administration.   Anatomy and Physiology of the Respiratory System and Intimacy:  -Group instruction provided by PowerPoint, verbal discussion, and written material to support subject matter. Instructor reviews respiratory cycle and anatomical components of the respiratory system and their functions. Instructor also reviews differences in obstructive and restrictive respiratory diseases with examples of each. Intimacy, Sex, and Sexuality differences are reviewed with a discussion on how relationships can change when diagnosed with pulmonary disease. Common sexual concerns are reviewed.   Knowledge Questionnaire Score:   Core Components/Risk Factors/Patient Goals at Admission:     Personal Goals and Risk Factors at Admission - 03/22/16 1039      Core Components/Risk Factors/Patient Goals on Admission   Increase Strength and Stamina Yes   Intervention Provide advice, education, support and counseling about physical activity/exercise needs.;Develop an individualized exercise prescription for aerobic and resistive training based on initial evaluation findings, risk stratification, comorbidities and participant's personal goals.   Expected Outcomes Achievement of increased cardiorespiratory fitness and enhanced flexibility, muscular endurance and strength shown through measurements of functional capacity and personal statement of participant.   Improve shortness of breath with ADL's Yes   Intervention Provide education, individualized exercise plan and daily activity instruction to help decrease symptoms of SOB with activities of daily living.   Expected Outcomes Short Term: Achieves a reduction of symptoms when performing activities of daily living.      Core Components/Risk Factors/Patient Goals Review:      Goals and Risk Factor Review     Row Name 03/22/16 1041 04/05/16 0910 05/04/16 0827 06/01/16 0801       Core Components/Risk Factors/Patient Goals  Review   Personal Goals Review Increase Strength and Stamina;Improve shortness of breath with ADL's Increase Strength and Stamina;Improve shortness of breath with ADL's Increase Strength and Stamina;Improve shortness of breath with ADL's Increase Strength and Stamina;Improve shortness of breath with ADL's    Review Exercise will increase strength, stamina, and improve SOB Exercise will increase strength, stamina, and improve SOB see comments section on ITP see comments section on ITP    Expected Outcomes Exercise will increase strength, stamina, and improve SOB Exercise will increase strength, stamina, and improve SOB see admission expected outcomes see admission expected outcomes       Core Components/Risk Factors/Patient Goals at Discharge (Final Review):      Goals and Risk Factor Review - 06/01/16 0801      Core Components/Risk Factors/Patient Goals Review   Personal Goals Review Increase Strength and Stamina;Improve shortness of breath with ADL's   Review see comments section on ITP   Expected Outcomes see admission expected outcomes      ITP Comments:   Comments: ITP REVIEW Pt is making expected progress toward pulmonary rehab goals after completing 15 sessions. He has traveled to the [place] since admission and he is extremely excited about the amount of walking he can now do. He stated that his family was amazed with his stamina and how much his shortness of breath has improved. Recommend continued exercise, life style modification, education, and utilization of breathing techniques to increase stamina and strength and decrease shortness of breath with exertion.

## 2016-06-01 NOTE — Progress Notes (Signed)
Daily Session Note  Patient Details  Name: Bruce Travis MRN: [id number] Date of Birth: 1935/07/21 Referring Provider:   April Manson Pulmonary Rehab Walk Test from 03/25/2016 in [place]  Referring Provider  Dr. Haroldine Laws      Encounter Date: 06/01/2016  Check In:     Session Check In - 06/01/16 1006      Check-In   Location [facility]   Staff Present Rosebud Poles, RN, BSN;Molly diVincenzo, MS, ACSM RCEP, Exercise Physiologist;Lisa Ysidro Evert, RN;Dulce Martian Rollene Rotunda, RN, BSN   Supervising physician immediately available to respond to emergencies [facility] immediately available   Physician(s) Dr. Waldron Labs   Medication changes reported     No   Fall or balance concerns reported    No   Warm-up and Cool-down Performed as group-led instruction   Resistance Training Performed Yes   VAD Patient? No     Pain Assessment   Currently in Pain? No/denies   Multiple Pain Sites No      Capillary Blood Glucose: No results found for this or any previous visit (from the past 24 hour(s)).      Exercise Prescription Changes - 06/01/16 1217      Response to Exercise   Blood Pressure (Admit) 102/64   Blood Pressure (Exercise) 126/60   Blood Pressure (Exit) 108/56   Heart Rate (Admit) 63 bpm   Heart Rate (Exercise) 86 bpm   Heart Rate (Exit) 61 bpm   Oxygen Saturation (Admit) 91 %   Oxygen Saturation (Exercise) 87 %   Oxygen Saturation (Exit) 94 %   Rating of Perceived Exertion (Exercise) 15   Perceived Dyspnea (Exercise) 3   Duration Progress to 45 minutes of aerobic exercise without signs/symptoms of physical distress   Intensity THRR unchanged     Progression   Progression Continue to progress workloads to maintain intensity without signs/symptoms of physical distress.     Resistance Training   Training Prescription Yes   Weight orange bands   Reps 10-12  10 minutes of strength training     Interval Training   Interval  Training No     Oxygen   Oxygen Continuous   Liters 3     Recumbant Bike   Level 4   Minutes 17     NuStep   Level 4   Minutes 17   METs 2.2     Track   Laps 16   Minutes 17     Goals Met:  Independence with exercise equipment Improved SOB with ADL's Using PLB without cueing & demonstrates good technique Exercise tolerated well No report of cardiac concerns or symptoms Strength training completed today  Goals Unmet:  Not Applicable  Comments: Service time is from 1030 to 1200   Dr. Rush Farmer is Medical Director for Pulmonary Rehab at [facility].

## 2016-06-03 ENCOUNTER — Encounter (HOSPITAL_COMMUNITY)
Admission: RE | Admit: 2016-06-03 | Discharge: 2016-06-03 | Disposition: A | Discharge status: Home / Self Care | Payer: Medicare Other | Source: Ambulatory Visit | Attending: Internal Medicine | Admitting: Internal Medicine

## 2016-06-03 DIAGNOSIS — I5021 Acute systolic (congestive) heart failure: Secondary | ICD-10-CM | POA: Diagnosis not present

## 2016-06-03 NOTE — Progress Notes (Signed)
Daily Session Note  Patient Details  Name: Bruce Travis MRN: [id number] Date of Birth: 21-Oct-1934 Referring Provider:   April Manson Pulmonary Rehab Walk Test from 03/25/2016 in [place]  Referring Provider  Dr. Haroldine Laws      Encounter Date: 06/03/2016  Check In:     Session Check In - 06/03/16 1212      Check-In   [facility]   Staff Present Su Hilt, MS, ACSM RCEP, Exercise Physiologist;Lisa Ysidro Evert, RN;Portia Ben [place]on, RN, Maxcine Ham, RN, BSN   Supervising physician immediately available to respond to emergencies [facility] immediately available   Physician(s) Dr. Alfredia Ferguson   Medication changes reported     No   Fall or balance concerns reported    No   Warm-up and Cool-down Performed as group-led instruction   Resistance Training Performed Yes   VAD Patient? No     Pain Assessment   Currently in Pain? No/denies   Multiple Pain Sites No      Capillary Blood Glucose: No results found for this or any previous visit (from the past 24 hour(s)).      Exercise Prescription Changes - 06/03/16 1200      Response to Exercise   Blood Pressure (Admit) 100/60   Blood Pressure (Exercise) 130/66   Blood Pressure (Exit) 118/64   Heart Rate (Admit) 54 bpm   Heart Rate (Exercise) 69 bpm   Heart Rate (Exit) 67 bpm   Oxygen Saturation (Admit) 95 %   Oxygen Saturation (Exercise) 86 %   Oxygen Saturation (Exit) 96 %   Rating of Perceived Exertion (Exercise) 13   Perceived Dyspnea (Exercise) 3   Duration Progress to 45 minutes of aerobic exercise without signs/symptoms of physical distress   Intensity THRR unchanged     Progression   Progression Continue to progress workloads to maintain intensity without signs/symptoms of physical distress.     Resistance Training   Training Prescription Yes   Weight orange bands   Reps 10-12  10 minutes of strength training     Interval Training   Interval Training  No     Oxygen   Oxygen Continuous  desaturating on track increased to 4 L   Liters 4     NuStep   Level 4   Minutes 17   METs 2     Track   Laps 15   Minutes 17     Goals Met:  Exercise tolerated well Strength training completed today  Goals Unmet:  Not Applicable  Comments: Service time is from 1030 to 1210    Dr. Rush Farmer is Medical Director for Pulmonary Rehab at [facility].

## 2016-06-08 ENCOUNTER — Encounter (HOSPITAL_COMMUNITY)
Admission: RE | Admit: 2016-06-08 | Discharge: 2016-06-08 | Disposition: A | Discharge status: Home / Self Care | Payer: Medicare Other | Source: Ambulatory Visit | Attending: Internal Medicine | Admitting: Internal Medicine

## 2016-06-08 VITALS — Wt 129.4 lb

## 2016-06-08 DIAGNOSIS — I5021 Acute systolic (congestive) heart failure: Secondary | ICD-10-CM

## 2016-06-08 NOTE — Progress Notes (Signed)
Daily Session Note  Patient Details  Name: Bruce Travis MRN: [id number] Date of Birth: [birth year] Referring Provider:   April Manson Pulmonary Rehab Walk Test from 03/25/2016 in [place]  Referring Provider  Dr. Haroldine Laws      Encounter Date: 06/08/2016  Check In:     Session Check In - 06/08/16 1011      Check-In   [facility]   Staff Present Rosebud Poles, RN, BSN;Ramon Dredge, RN, MHA;Portia Rollene Rotunda, RN, BSN;Molly diVincenzo, MS, ACSM RCEP, Exercise Physiologist   Physician(s) Dr. Alfredia Ferguson   Medication changes reported     No   Fall or balance concerns reported    No   Warm-up and Cool-down Performed as group-led instruction   Resistance Training Performed Yes   VAD Patient? No     Pain Assessment   Currently in Pain? No/denies   Multiple Pain Sites No      Capillary Blood Glucose: No results found for this or any previous visit (from the past 24 hour(s)).      Exercise Prescription Changes - 06/08/16 1200      Response to Exercise   Blood Pressure (Admit) 110/56   Blood Pressure (Exercise) 120/60   Blood Pressure (Exit) 106/60   Heart Rate (Admit) 66 bpm   Heart Rate (Exercise) 76 bpm   Heart Rate (Exit) 67 bpm   Oxygen Saturation (Admit) 95 %   Oxygen Saturation (Exercise) 88 %   Oxygen Saturation (Exit) 94 %   Rating of Perceived Exertion (Exercise) 13   Perceived Dyspnea (Exercise) 1   Duration Progress to 45 minutes of aerobic exercise without signs/symptoms of physical distress   Intensity THRR unchanged     Progression   Progression Continue to progress workloads to maintain intensity without signs/symptoms of physical distress.     Resistance Training   Training Prescription Yes   Weight orange bands   Reps 10-12  10 minutes of strength training     Interval Training   Interval Training No     Oxygen   Oxygen Continuous   Liters 4     Recumbant Bike   Level 4   Minutes 17      NuStep   Level 4   Minutes 17   METs 2.3     Track   Laps 18   Minutes 17     Goals Met:  Exercise tolerated well Strength training completed today  Goals Unmet:  Not Applicable  Comments: Service time is from 1030 to 1205    Dr. Rush Farmer is Medical Director for Pulmonary Rehab at [facility].

## 2016-06-10 ENCOUNTER — Encounter (HOSPITAL_COMMUNITY)
Admission: RE | Admit: 2016-06-10 | Discharge: 2016-06-10 | Disposition: A | Discharge status: Home / Self Care | Payer: Medicare Other | Source: Ambulatory Visit | Attending: Internal Medicine | Admitting: Internal Medicine

## 2016-06-10 DIAGNOSIS — I5021 Acute systolic (congestive) heart failure: Secondary | ICD-10-CM | POA: Diagnosis not present

## 2016-06-10 NOTE — Progress Notes (Signed)
Daily Session Note  Patient Details  Name: Bruce Travis MRN: [id number] Date of Birth: Mar 02, 1935 Referring Provider:   April Manson Pulmonary Rehab Walk Test from 03/25/2016 in [place]  Referring Provider  Dr. Haroldine Laws      Encounter Date: 06/10/2016  Check In:   Capillary Blood Glucose: No results found for this or any previous visit (from the past 24 hour(s)).      Exercise Prescription Changes - 06/10/16 1200      Response to Exercise   Blood Pressure (Admit) 110/60   Blood Pressure (Exercise) 148/60   Blood Pressure (Exit) 114/62   Heart Rate (Admit) 63 bpm   Heart Rate (Exercise) 74 bpm   Heart Rate (Exit) 58 bpm   Oxygen Saturation (Admit) 94 %   Oxygen Saturation (Exercise) 90 %   Oxygen Saturation (Exit) 97 %   Rating of Perceived Exertion (Exercise) 13   Perceived Dyspnea (Exercise) 2   Duration Progress to 45 minutes of aerobic exercise without signs/symptoms of physical distress   Intensity THRR unchanged     Progression   Progression Continue to progress workloads to maintain intensity without signs/symptoms of physical distress.     Resistance Training   Training Prescription Yes   Weight orange bands   Reps 10-12  10 minutes of strength training     Interval Training   Interval Training No     Oxygen   Oxygen Continuous   Liters 4     Recumbant Bike   Level 4   Minutes 17     Track   Laps 16   Minutes 17     Goals Met:  Exercise tolerated well No report of cardiac concerns or symptoms Strength training completed today  Goals Unmet:  Not Applicable  Comments: Service time is from 10:30am to 12:00pm    Dr. Rush Farmer is Medical Director for Pulmonary Rehab at [facility].

## 2016-06-11 ENCOUNTER — Other Ambulatory Visit: Payer: Self-pay | Admitting: Family Medicine

## 2016-06-15 ENCOUNTER — Encounter (HOSPITAL_COMMUNITY)
Admission: RE | Admit: 2016-06-15 | Discharge: 2016-06-15 | Disposition: A | Discharge status: Home / Self Care | Payer: Medicare Other | Source: Ambulatory Visit | Attending: Internal Medicine | Admitting: Internal Medicine

## 2016-06-15 VITALS — Wt 128.3 lb

## 2016-06-15 DIAGNOSIS — I5021 Acute systolic (congestive) heart failure: Secondary | ICD-10-CM | POA: Diagnosis not present

## 2016-06-15 NOTE — Progress Notes (Signed)
Daily Session Note  Patient Details  Name: Bruce Travis MRN: [id number] Date of Birth: 10/02/1934 Referring Provider:   Flowsheet Row Pulmonary Rehab Walk Test from 03/25/2016 in [name]  Referring Provider  [name]      Encounter Date: 06/15/2016  Check In:     Session Check In - 06/15/16 1013      Check-In   Location [facility]   Staff Present Molly diVincenzo, MS, ACSM RCEP, Exercise Physiologist;Lisa Hughes, RN;Portia Payne, RN, BSN;Joan Behrens, RN, BSN   Supervising physician immediately available to respond to emergencies See telemetry face sheet for immediately available MD   Physician(s) [name]   Medication changes reported     No   Fall or balance concerns reported    No   Warm-up and Cool-down Performed as group-led instruction   Resistance Training Performed Yes   VAD Patient? No     Pain Assessment   Currently in Pain? No/denies   Multiple Pain Sites No      Capillary Blood Glucose: No results found for this or any previous visit (from the past 24 hour(s)).      Exercise Prescription Changes - 06/15/16 1200      Exercise Review   Progression Yes     Response to Exercise   Blood Pressure (Admit) 120/70   Blood Pressure (Exercise) 126/70   Blood Pressure (Exit) 118/68   Heart Rate (Admit) 60 bpm   Heart Rate (Exercise) 81 bpm   Heart Rate (Exit) 70 bpm   Oxygen Saturation (Admit) 94 %   Oxygen Saturation (Exercise) 88 %   Oxygen Saturation (Exit) 93 %   Rating of Perceived Exertion (Exercise) 13   Perceived Dyspnea (Exercise) 2   Duration Progress to 45 minutes of aerobic exercise without signs/symptoms of physical distress   Intensity THRR unchanged     Progression   Progression Continue to progress workloads to maintain intensity without signs/symptoms of physical distress.     Resistance Training   Training Prescription Yes   Weight orange bands   Reps 10-12  10 minutes of strength  training     Interval Training   Interval Training No     Oxygen   Oxygen Continuous   Liters 4     Recumbant Bike   Level 4   Minutes 17     NuStep   Level 5   Minutes 17   METs 2.3     Track   Laps 17   Minutes 17     Goals Met:  Exercise tolerated well No report of cardiac concerns or symptoms Strength training completed today  Goals Unmet:  Not Applicable  Comments: Service time is from 10:30am to 12:00pm    [name] is Medical Director for Pulmonary Rehab at [facility]. 

## 2016-06-17 ENCOUNTER — Encounter (HOSPITAL_COMMUNITY): Payer: Medicare Other

## 2016-06-22 ENCOUNTER — Ambulatory Visit (INDEPENDENT_AMBULATORY_CARE_PROVIDER_SITE_OTHER): Payer: Medicare Other | Admitting: Family Medicine

## 2016-06-22 ENCOUNTER — Encounter: Payer: Self-pay | Admitting: Family Medicine

## 2016-06-22 ENCOUNTER — Encounter (HOSPITAL_COMMUNITY)
Admission: RE | Admit: 2016-06-22 | Discharge: 2016-06-22 | Disposition: A | Discharge status: Home / Self Care | Payer: Medicare Other | Source: Ambulatory Visit | Attending: Internal Medicine | Admitting: Internal Medicine

## 2016-06-22 VITALS — Wt 128.5 lb

## 2016-06-22 VITALS — BP 130/68 | HR 70 | Temp 97.9°F | Resp 16 | Ht 66.5 in | Wt 131.0 lb

## 2016-06-22 DIAGNOSIS — J441 Chronic obstructive pulmonary disease with (acute) exacerbation: Secondary | ICD-10-CM

## 2016-06-22 DIAGNOSIS — I5021 Acute systolic (congestive) heart failure: Secondary | ICD-10-CM | POA: Diagnosis not present

## 2016-06-22 MED ORDER — PREDNISONE 20 MG PO TABS: ORAL_TABLET | ORAL | 0 refills | Status: DC

## 2016-06-22 MED ORDER — ALBUTEROL SULFATE HFA 108 (90 BASE) MCG/ACT IN AERS: INHALATION_SPRAY | RESPIRATORY_TRACT | 5 refills | Status: DC

## 2016-06-22 NOTE — Progress Notes (Signed)
Subjective:    Patient ID: Bruce Travis, male    DOB: [birth year], 81 y.o.   MRN: [id number]  HPI  Patient has a long-standing history of severe oxygen dependent COPD. He was recently diagnosed with congestive heart failure with an ejection fraction of 25% earlier this year. Starting this past week he has developed an upper respiratory infection with cough. He is now reporting increasing dyspnea on exertion, cough productive of clear mucus, and increasing wheezing. This morning he was at pulmonary rehabilitation. While exercising his oxygen saturation dropped into the 80s. They recommended that he see me. On his examination today is 91% on 2 L at rest. He denies any chest pain. He denies any orthopnea. His weight is actually down 7 pounds from the last time I saw him in April. There is no pitting edema. There is no evidence of fluid overload on exam. He is in normal sinus rhythm. However he has markedly diminished breath sounds bilaterally with faint expiratory wheezing and poor air movement. Past Medical History:  Diagnosis Date  . Allergic rhinitis   . Bronchiectasis   . CAD (coronary artery disease)   . Celiac disease   . Chronic heart failure ([employer])   . Chronic kidney disease (CKD), stage III (moderate)   . COPD (chronic obstructive pulmonary disease) ([place])   . Diverticulosis of colon (without mention of hemorrhage)   . Esophageal reflux   . Family history of adverse reaction to anesthesia    daughter gets PONV  . Family hx of colon cancer   . History of stomach ulcers 1980s  . Hyperlipemia   . Hypertension   . 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 Myocardial infarction    "previous MI/echo in 12/2015"  . Nephrolithiasis    "got them now; never had OR/scopes" (02/03/2016)  . On home oxygen therapy    "2L; 24/7" (02/03/2016)  . Other diseases of lung, not elsewhere classified   . Personal history of colonic polyps 04/26/2007   hyperplastic   . Pneumonia  "several times"   Past Surgical History:  Procedure Laterality Date  . CARDIAC CATHETERIZATION  02/03/2016  . CARDIAC CATHETERIZATION  09/30/2009   non-obstructive CAD w/30% narrowing in prox LAD (Dr. Waunita Schooner)  . CARDIAC CATHETERIZATION  05/04/2001   same at 2011 cath (Dr. Waunita Schooner)  . CARDIAC CATHETERIZATION N/A 02/03/2016   Procedure: Right/Left Heart Cath and Coronary Angiography;  Surgeon: Pixie Casino, MD;  Location: [facility];  Service: Cardiovascular;  Laterality: N/A;  . EPIGLOTOPLASTY W/ MLB     removal due to carcinoma  . EXCISIONAL HEMORRHOIDECTOMY  2000s  . HAND SURGERY Right 1958   d/t crush injury  . TONSILLECTOMY AND ADENOIDECTOMY    . ULTRASOUND GUIDANCE FOR VASCULAR ACCESS  02/03/2016   Procedure: Ultrasound Guidance For Vascular Access;  Surgeon: Pixie Casino, MD;  Location: [facility];  Service: Cardiovascular;;  . VASECTOMY     Current Outpatient Prescriptions on File Prior to Visit  Medication Sig Dispense Refill  . AMBULATORY NON FORMULARY MEDICATION O2 2 Lpm continuous    . amiodarone (PACERONE) 200 MG tablet Take 1 tablet (200 mg total) by mouth daily. 30 tablet 6  . aspirin EC 81 MG tablet Take 81 mg by mouth daily.    Marland Kitchen azelastine (ASTELIN) 0.1 % nasal spray Place 2 sprays into both nostrils 2 (two) times daily as needed for rhinitis or allergies.     Marland Kitchen  montelukast (SINGULAIR) 10 MG tablet TAKE 1 TABLET BY MOUTH DAILY. 90 tablet 0  . Multiple Vitamin (MULTIVITAMIN WITH MINERALS) TABS tablet Take 1 tablet by mouth daily.    . pantoprazole (PROTONIX) 40 MG tablet TAKE ONE TABLET BY MOUTH DAILY. 30 tablet 0  . polyethylene glycol (MIRALAX / GLYCOLAX) packet Take 17 g by mouth daily as needed for mild constipation.     . sacubitril-valsartan (ENTRESTO) 49-51 MG Take 1 tablet by mouth 2 (two) times daily. 60 tablet 6  . SPIRIVA HANDIHALER 18 MCG inhalation capsule INHALE 1 CAPSULE ONCE DAILY AS DIRECTED 30 capsule 11  . spironolactone  (ALDACTONE) 25 MG tablet Take 0.5 tablets (12.5 mg total) by mouth daily. 16 tablet 6  . SYMBICORT 160-4.5 MCG/ACT inhaler INHALE 2 PUFFS FIRST THING IN MORNING AND THEN ANOTHER 2 PUFFS ABOUT 12 HOURS LATER. 10.2 g 11   No current facility-administered medications on file prior to visit.    Allergies  Allergen Reactions  . Adhesive [Tape] Other (See Comments)    Band-aids - tears skin off  . Codeine Other (See Comments)    Thought head was going to blow off  . Gluten Meal Other (See Comments)    Celiac disease   Social History   Social History  . Marital status: Married    Spouse name: N/A  . Number of children: 3  . Years of education: N/A   Occupational History  . retired Retired    Psychologist, sport and exercise and Administrator   Social History Main Topics  . Smoking status: Former Smoker    Packs/day: 2.00    Years: 35.00    Types: Cigarettes    Quit date: 01/21/1989  . Smokeless tobacco: Former Systems developer    Types: Chew    Quit date: 08/23/2001  . Alcohol use No  . Drug use: No  . Sexual activity: Not on file   Other Topics Concern  . Not on file   Social History Narrative  . No narrative on file     Review of Systems  All other systems reviewed and are negative.      Objective:   Physical Exam  Neck: No JVD present.  Cardiovascular: Normal rate, regular rhythm and normal heart sounds.   Pulmonary/Chest: Effort normal. No respiratory distress. He has decreased breath sounds. He has wheezes. He has no rhonchi. He has no rales.  Musculoskeletal: He exhibits no edema.  Vitals reviewed.         Assessment & Plan:  COPD exacerbation ([place]) - Plan: predniSONE (DELTASONE) 20 MG tablet, albuterol (PROAIR HFA) 108 (90 Base) MCG/ACT inhaler  His dyspnea is more in keeping with a COPD exacerbation secondary to a recent upper respiratory infection. I see no evidence of congestive heart failure on his exam today or fluid overload. Therefore I'll start the patient on a short course of  prednisone taper pack, albuterol 2 puffs inhaled every 6 hours and Mucinex. I see no role for antibiotics given the fact he is afebrile and his sputum has not demonstrated any color change or evidence of purulence. Recheck Thursday or Friday if no better or sooner if worse. Monitor weights or signs of fluid retention

## 2016-06-22 NOTE — Progress Notes (Signed)
Daily Session Note  Patient Details  Name: Bruce Travis MRN: [id number] Date of Birth: July 29, 1935 Referring Provider:   April Manson Pulmonary Rehab Walk Test from 03/25/2016 in [place]  Referring Provider  Dr. Haroldine Laws      Encounter Date: 06/22/2016  Check In:     Session Check In - 06/22/16 1033      Check-In   Location [facility]   Staff Present Rosebud Poles, RN, BSN;Molly diVincenzo, MS, ACSM RCEP, Exercise Physiologist;[place] Ysidro Evert, Felipe Drone, RN, MHA;Portia Rollene Rotunda, RN, BSN   Supervising physician immediately available to respond to emergencies [facility] immediately available   Physician(s) Dr. Alfredia Ferguson   Medication changes reported     No   Fall or balance concerns reported    No   Warm-up and Cool-down Performed as group-led instruction   Resistance Training Performed Yes   VAD Patient? No     Pain Assessment   Currently in Pain? No/denies   Multiple Pain Sites No      Capillary Blood Glucose: No results found for this or any previous visit (from the past 24 hour(s)).      Exercise Prescription Changes - 06/22/16 1200      Response to Exercise   Blood Pressure (Admit) 127/74   Blood Pressure (Exercise) 140/58   Blood Pressure (Exit) 96/60   Heart Rate (Admit) 66 bpm   Heart Rate (Exercise) 78 bpm   Heart Rate (Exit) 67 bpm   Oxygen Saturation (Admit) 92 %   Oxygen Saturation (Exercise) 83 %  sat increased to 88% with rest   Oxygen Saturation (Exit) 93 %   Rating of Perceived Exertion (Exercise) 13   Perceived Dyspnea (Exercise) 3   Duration Progress to 45 minutes of aerobic exercise without signs/symptoms of physical distress   Intensity THRR unchanged     Progression   Progression Continue to progress workloads to maintain intensity without signs/symptoms of physical distress.     Resistance Training   Training Prescription Yes   Weight orange bands   Reps 10-12  10 minutes of  strength training     Interval Training   Interval Training No     Oxygen   Oxygen Continuous   Liters 4     Recumbant Bike   Level 4   Minutes 17     NuStep   Level 5   Minutes 17   METs 2.4     Track   Laps 15   Minutes 17     Goals Met:  Exercise tolerated well No report of cardiac concerns or symptoms Strength training completed today  Goals Unmet:  Not Applicable  Comments: Service time is from 1030 to 1215     Dr. Rush Farmer is Medical Director for Pulmonary Rehab at [facility].

## 2016-06-24 ENCOUNTER — Encounter (HOSPITAL_COMMUNITY): Payer: Medicare Other

## 2016-06-25 ENCOUNTER — Telehealth (HOSPITAL_COMMUNITY): Payer: Self-pay | Admitting: Cardiology

## 2016-06-25 NOTE — Telephone Encounter (Signed)
patient called with concerns regarding weight increase over the past week. Patient reports he does have some increased SOB however this is normal, he is currently take a round of prednisone for COPD exacerbation.   He has not missed any doses.  Please advise

## 2016-06-29 ENCOUNTER — Encounter (HOSPITAL_COMMUNITY)
Admission: RE | Admit: 2016-06-29 | Discharge: 2016-06-29 | Disposition: A | Discharge status: Home / Self Care | Payer: Medicare Other | Source: Ambulatory Visit | Attending: Internal Medicine | Admitting: Internal Medicine

## 2016-06-29 VITALS — Wt 128.7 lb

## 2016-06-29 DIAGNOSIS — I5021 Acute systolic (congestive) heart failure: Secondary | ICD-10-CM | POA: Insufficient documentation

## 2016-06-29 NOTE — Progress Notes (Signed)
Daily Session Note  Patient Details  Name: Bruce Travis MRN: [id number] Date of Birth: 1934/10/11 Referring Provider:   April Manson Pul[facility] Rehab Walk Test from 03/25/2016 in [place]  Referring Provider  Dr. Haroldine Laws      Encounter Date: 06/29/2016  Check In:     Session Check In - 06/29/16 1026      Check-In   Location [facility]   Staff Present Rosebud Poles, RN, BSN;Parthiv Mucci, MS, ACSM RCEP, Exercise Physiologist;Lisa Ysidro Evert, RN;Portia Rollene Rotunda, RN, BSN   Supervising physician immediately available to respond to emergencies [facility] immediately available   Physician(s) Dr. Lonny Prude   Medication changes reported     No   Warm-up and Cool-down Performed as group-led instruction   Resistance Training Performed Yes   VAD Patient? No     Pain Assessment   Currently in Pain? No/denies   Multiple Pain Sites No      Capillary Blood Glucose: No results found for this or any previous visit (from the past 24 hour(s)).      Exercise Prescription Changes - 06/29/16 1200      Exercise Review   Progression Yes     Response to Exercise   Blood Pressure (Admit) 130/64   Blood Pressure (Exercise) 140/64   Blood Pressure (Exit) 126/64   Heart Rate (Admit) 72 bpm   Heart Rate (Exercise) 81 bpm   Heart Rate (Exit) 73 bpm   Oxygen Saturation (Admit) 92 %   Oxygen Saturation (Exercise) 87 %  sat increased to 88% with rest   Oxygen Saturation (Exit) 97 %   Rating of Perceived Exertion (Exercise) 13   Perceived Dyspnea (Exercise) 2   Duration Progress to 45 minutes of aerobic exercise without signs/symptoms of physical distress   Intensity THRR unchanged     Progression   Progression Continue to progress workloads to maintain intensity without signs/symptoms of physical distress.     Resistance Training   Training Prescription Yes   Weight orange bands   Reps 10-12  10 minutes of strength training     Interval  Training   Interval Training No     Oxygen   Oxygen Continuous   Liters 4     Recumbant Bike   Level 5   Minutes 17     NuStep   Level 5   Minutes 17   METs 2.3     Track   Laps 16   Minutes 17     Goals Met:  Exercise tolerated well No report of cardiac concerns or symptoms Strength training completed today  Goals Unmet:  Not Applicable  Comments: Service time is from 10:30am to 12:05pm    Dr. Rush Farmer is Medical Director for Pulmonary Rehab at [facility].

## 2016-06-29 NOTE — Progress Notes (Signed)
Pulmonary Individual Treatment Plan  Patient Details  Name: Bruce Travis MRN: [id number] Date of Birth: [birth year] Referring Provider:   April Manson Pulmonary Rehab Walk Test from 03/25/2016 in [facility]  Referring Provider  Dr. Haroldine Laws      Initial Encounter Date:  Flowsheet Row Pulmonary Rehab Walk Test from 03/25/2016 in [facility]  Date  03/25/16  Referring Provider  Dr. Haroldine Laws      Visit Diagnosis: Acute systolic heart failure ([place])  Patient's Home Medications on Admission:   Current Outpatient Prescriptions:  .  albuterol (PROAIR HFA) 108 (90 Base) MCG/ACT inhaler, INHALE 2 PUFFS EVERY 4 HOURS AS NEEDED FOR SHORTNESS OF BREATH., Disp: 8.5 g, Rfl: 5 .  AMBULATORY NON FORMULARY MEDICATION, O2 2 Lpm continuous, Disp: , Rfl:  .  amiodarone (PACERONE) 200 MG tablet, Take 1 tablet (200 mg total) by mouth daily., Disp: 30 tablet, Rfl: 6 .  aspirin EC 81 MG tablet, Take 81 mg by mouth daily., Disp: , Rfl:  .  azelastine (ASTELIN) 0.1 % nasal spray, Place 2 sprays into both nostrils 2 (two) times daily as needed for rhinitis or allergies. , Disp: , Rfl:  .  montelukast (SINGULAIR) 10 MG tablet, TAKE 1 TABLET BY MOUTH DAILY., Disp: 90 tablet, Rfl: 0 .  Multiple Vitamin (MULTIVITAMIN WITH MINERALS) TABS tablet, Take 1 tablet by mouth daily., Disp: , Rfl:  .  pantoprazole (PROTONIX) 40 MG tablet, TAKE ONE TABLET BY MOUTH DAILY., Disp: 30 tablet, Rfl: 0 .  polyethylene glycol (MIRALAX / GLYCOLAX) packet, Take 17 g by mouth daily as needed for mild constipation. , Disp: , Rfl:  .  predniSONE (DELTASONE) 20 MG tablet, 3 tabs poqday 1-2, 2 tabs poqday 3-4, 1 tab poqday 5-6, Disp: 12 tablet, Rfl: 0 .  sacubitril-valsartan (ENTRESTO) 49-51 MG, Take 1 tablet by mouth 2 (two) times daily., Disp: 60 tablet, Rfl: 6 .  SPIRIVA HANDIHALER 18 MCG inhalation capsule, INHALE 1 CAPSULE ONCE DAILY AS DIRECTED, Disp: 30 capsule, Rfl: 11 .   spironolactone (ALDACTONE) 25 MG tablet, Take 0.5 tablets (12.5 mg total) by mouth daily., Disp: 16 tablet, Rfl: 6 .  SYMBICORT 160-4.5 MCG/ACT inhaler, INHALE 2 PUFFS FIRST THING IN MORNING AND THEN ANOTHER 2 PUFFS ABOUT 12 HOURS LATER., Disp: 10.2 g, Rfl: 11  Past Medical History: Past Medical History:  Diagnosis Date  . Allergic rhinitis   . Bronchiectasis   . CAD (coronary artery disease)   . Celiac disease   . Chronic heart failure ([place])   . Chronic kidney disease (CKD), stage III (moderate)   . COPD (chronic obstructive pulmonary disease) ([place])   . Diverticulosis of colon (without mention of hemorrhage)   . Esophageal reflux   . Family history of adverse reaction to anesthesia    daughter gets PONV  . Family hx of colon cancer   . History of stomach ulcers 1980s  . Hyperlipemia   . Hypertension   . 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 Myocardial infarction    "previous MI/echo in 12/2015"  . Nephrolithiasis    "got them now; never had OR/scopes" (02/03/2016)  . On home oxygen therapy    "2L; 24/7" (02/03/2016)  . Other diseases of lung, not elsewhere classified   . Personal history of colonic polyps 04/26/2007   hyperplastic   . Pneumonia "several times"    Tobacco Use: History  Smoking Status  . Former Smoker  .  Packs/day: 2.00  . Years: 35.00  . Types: Cigarettes  . Quit date: 01/21/1989  Smokeless Tobacco  . Former Systems developer  . Types: Chew  . Quit date: 08/23/2001    Labs: Recent Review Flowsheet Data    Labs for ITP Cardiac and Pulmonary Rehab Latest Ref Rng & Units 02/03/2016 02/04/2016 02/05/2016 02/06/2016 03/09/2016   Cholestrol 125 - 200 mg/dL - - - - 208(H)   LDLCALC <130 mg/dL - - - - 132(H)   HDL >=40 mg/dL - - - - 33(L)   Trlycerides <150 mg/dL - - - - 217(H)   Hemoglobin A1c 4.8 - 5.6 % - - 5.6 - -   PHART 7.350 - 7.450 7.375 - - - -   PCO2ART 35.0 - 45.0 mmHg 37.8 - - - -   HCO3 20.0 - 24.0 mEq/L 22.1 - - - -   TCO2 0 - 100  mmol/L 23 - - - -   ACIDBASEDEF 0.0 - 2.0 mmol/L 3.0(H) - - - -   O2SAT % 91.0 67.0 80.1 72.0 -      Capillary Blood Glucose: No results found for: GLUCAP   ADL UCSD:   Pulmonary Function Assessment:     Pulmonary Function Assessment - 03/22/16 1036      Breath   Bilateral Breath Sounds Clear   Shortness of Breath Yes;Limiting activity      Exercise Target Goals:    Exercise Program Goal: Individual exercise prescription set with THRR, safety & activity barriers. Participant demonstrates ability to understand and report RPE using BORG scale, to self-measure pulse accurately, and to acknowledge the importance of the exercise prescription.  Exercise Prescription Goal: Starting with aerobic activity 30 plus minutes a day, 3 days per week for initial exercise prescription. Provide home exercise prescription and guidelines that participant acknowledges understanding prior to discharge.  Activity Barriers & Risk Stratification:     Activity Barriers & Cardiac Risk Stratification - 03/22/16 1034      Activity Barriers & Cardiac Risk Stratification   Activity Barriers None      6 Minute Walk:     6 Minute Walk    Row Name 03/25/16 1633         6 Minute Walk   Phase Initial     Distance 1175 feet     Walk Time 6 minutes     # of Rest Breaks 0     MPH 2.22     METS 2.68     RPE 13     Perceived Dyspnea  3     Symptoms No     Resting HR 65 bpm     Resting BP 162/80     Max Ex. HR 78 bpm     Max Ex. BP 158/82       Interval Oxygen   Interval Oxygen? Yes     Baseline Oxygen Saturation % 65 %     Baseline Liters of Oxygen 2 L     1 Minute Oxygen Saturation % 93 %     1 Minute Liters of Oxygen 2 L     2 Minute Oxygen Saturation % 92 %     2 Minute Liters of Oxygen 2 L     3 Minute Oxygen Saturation % 88 %     3 Minute Liters of Oxygen 2 L     4 Minute Oxygen Saturation % 88 %     4 Minute Liters of Oxygen 2 L  5 Minute Oxygen Saturation % 87 %     5  Minute Liters of Oxygen 3 L     6 Minute Oxygen Saturation % 86 %     6 Minute Liters of Oxygen 3 L     2 Minute Post Oxygen Saturation % 91 %     2 Minute Post Liters of Oxygen 3 L        Initial Exercise Prescription:     Initial Exercise Prescription - 03/25/16 1600      Date of Initial Exercise RX and Referring Provider   Date 03/25/16   Referring Provider Dr. Haroldine Laws     Oxygen   Oxygen Continuous   Liters 3     Bike   Level 0.5   Minutes 17     NuStep   Level 1   Minutes 17   METs 1.7     Track   Laps 6   Minutes 17     Prescription Details   Frequency (times per week) 2   Duration Progress to 45 minutes of aerobic exercise without signs/symptoms of physical distress     Intensity   THRR 40-80% of Max Heartrate 56-113   Ratings of Perceived Exertion 11-13   Perceived Dyspnea 0-4     Progression   Progression Continue progressive overload as per policy without signs/symptoms or physical distress.     Resistance Training   Training Prescription Yes   Weight orange    Reps 10-12      Perform Capillary Blood Glucose checks as needed.  Exercise Prescription Changes:     Exercise Prescription Changes    Row Name 04/01/16 1200 04/06/16 1200 04/13/16 1200 04/15/16 1200 04/20/16 1217     Exercise Review   Progression  -  - Yes Yes Yes     Response to Exercise   Blood Pressure (Admit) 146/84 122/60 150/68 118/60 132/70   Blood Pressure (Exercise) 150/70 142/90 142/86 132/74 98/60   Blood Pressure (Exit) 140/66 118/60 110/70 142/82 122/64   Heart Rate (Admit) 66 bpm 65 bpm 66 bpm 64 bpm 65 bpm   Heart Rate (Exercise) 89 bpm 69 bpm 83 bpm 78 bpm 78 bpm   Heart Rate (Exit) 67 bpm 71 bpm 74 bpm 63 bpm 67 bpm   Oxygen Saturation (Admit) 94 % 93 % 94 % 93 % 94 %   Oxygen Saturation (Exercise) 89 % 88 % 86 % 88 % 87 %   Oxygen Saturation (Exit) 95 % 94 % 93 % 95 % 93 %   Rating of Perceived Exertion (Exercise) 13 13 15 13 15    Perceived Dyspnea  (Exercise) 1 2 2 1 3    Duration Progress to 45 minutes of aerobic exercise without signs/symptoms of physical distress Progress to 45 minutes of aerobic exercise without signs/symptoms of physical distress Progress to 45 minutes of aerobic exercise without signs/symptoms of physical distress Progress to 45 minutes of aerobic exercise without signs/symptoms of physical distress Progress to 45 minutes of aerobic exercise without signs/symptoms of physical distress   Intensity THRR unchanged THRR unchanged THRR unchanged THRR unchanged THRR unchanged     Progression   Progression Continue progressive overload as per policy without signs/symptoms or physical distress. Continue progressive overload as per policy without signs/symptoms or physical distress. Continue progressive overload as per policy without signs/symptoms or physical distress. Continue to progress workloads to maintain intensity without signs/symptoms of physical distress. Continue to progress workloads to maintain intensity without signs/symptoms of physical  distress.     Resistance Training   Training Prescription Yes Yes Yes Yes Yes   Weight orange bands orange bands orange bands orange bands orange bands   Reps 10-12 10-12 10-12 10-12  10 minutes of strength training 10-12  10 minutes of strength training     Interval Training   Interval Training No No No No No     Oxygen   Oxygen Continuous Continuous Continuous Continuous Continuous   Liters 3 3 3 3 3      Bike   Level 0.5 2 3   - 3   Minutes 17 17 17   - 17     NuStep   Level  - 1 2 3 4    Minutes  - 17 17 17 17    METs  - 2.8 1.8 2.2 2.4     Track   Laps 4 12 14 15 15    Minutes 17 17 17 17 17      Home Exercise Plan   Plans to continue exercise at  -  -  - Home Home   Frequency  -  -  - Add 3 additional days to program exercise sessions. Add 3 additional days to program exercise sessions.   [place] Name 04/22/16 1200 04/27/16 1246 04/29/16 1200 05/04/16 1200 05/06/16  1200     Response to Exercise   Blood Pressure (Admit) 126/60 118/62 98/50 110/58 100/60   Blood Pressure (Exercise) 136/70 126/60 128/70 120/60 126/80   Blood Pressure (Exit) 134/74 124/70 100/60 118/60 126/72   Heart Rate (Admit) 61 bpm 65 bpm 61 bpm 64 bpm 61 bpm   Heart Rate (Exercise) 79 bpm 78 bpm 73 bpm 84 bpm 91 bpm   Heart Rate (Exit) 66 bpm 64 bpm 69 bpm 70 bpm 68 bpm   Oxygen Saturation (Admit) 93 % 94 % 95 % 93 % 95 %   Oxygen Saturation (Exercise) 89 % 87 % 85 % 88 % 91 %   Oxygen Saturation (Exit) 95 % 96 % 96 % 93 % 92 %   Rating of Perceived Exertion (Exercise) 13 13 13 15 13    Perceived Dyspnea (Exercise) 0 1 2 2 2    Duration Progress to 45 minutes of aerobic exercise without signs/symptoms of physical distress Progress to 45 minutes of aerobic exercise without signs/symptoms of physical distress Progress to 45 minutes of aerobic exercise without signs/symptoms of physical distress Progress to 45 minutes of aerobic exercise without signs/symptoms of physical distress Progress to 45 minutes of aerobic exercise without signs/symptoms of physical distress   Intensity THRR unchanged THRR unchanged THRR unchanged THRR unchanged THRR unchanged     Progression   Progression Continue to progress workloads to maintain intensity without signs/symptoms of physical distress. Continue to progress workloads to maintain intensity without signs/symptoms of physical distress. Continue to progress workloads to maintain intensity without signs/symptoms of physical distress. Continue to progress workloads to maintain intensity without signs/symptoms of physical distress. Continue to progress workloads to maintain intensity without signs/symptoms of physical distress.     Resistance Training   Training Prescription Yes Yes Yes Yes Yes   Weight orange bands orange bands orange bands orange bands orange bands   Reps 10-12  10 minutes of strength training 10-12  10 minutes of strength training  10-12  10 minutes of strength training 10-12  10 minutes of strength training 10-12  10 minutes of strength training     Interval Training   Interval Training No No No No No  Oxygen   Oxygen Continuous Continuous Continuous Continuous Continuous   Liters 3 3 3 3   -     Recumbant Bike   Level 3 3 3 3 3    Minutes 14 17 17 17 17      NuStep   Level 4 4 4 4 4    Minutes 17 17 17 17 17    METs 2.4 2.5 2.5 2.8 2.3     Track   Laps  - 14 15 12   -   Minutes  - 17 17 17   -     Home Exercise Plan   Plans to continue exercise at Home  -  -  -  -   Frequency Add 3 additional days to program exercise sessions.  -  -  -  -   Row Name 05/11/16 1200 05/13/16 1300 05/18/16 1200 05/25/16 1200 05/27/16 1200     Exercise Review   Progression  -  -  - Yes  -     Response to Exercise   Blood Pressure (Admit) 120/60 126/70 110/56 122/70 106/54   Blood Pressure (Exercise) 150/70  - 122/66 106/66 126/64   Blood Pressure (Exit) 122/62 118/62 106/60 130/80 120/70   Heart Rate (Admit) 67 bpm 65 bpm 59 bpm 63 bpm 61 bpm   Heart Rate (Exercise) 78 bpm 70 bpm 86 bpm 82 bpm 63 bpm   Heart Rate (Exit) 65 bpm 59 bpm 62 bpm 64 bpm 77 bpm   Oxygen Saturation (Admit) 92 % 94 % 94 % 93 % 96 %   Oxygen Saturation (Exercise) 90 % 93 % 89 % 88 % 91 %   Oxygen Saturation (Exit) 95 % 97 % 93 % 94 % 96 %   Rating of Perceived Exertion (Exercise) 13 13 13 13 13    Perceived Dyspnea (Exercise) 2 2 2 2 1    Duration Progress to 45 minutes of aerobic exercise without signs/symptoms of physical distress Progress to 45 minutes of aerobic exercise without signs/symptoms of physical distress Progress to 45 minutes of aerobic exercise without signs/symptoms of physical distress Progress to 45 minutes of aerobic exercise without signs/symptoms of physical distress Progress to 45 minutes of aerobic exercise without signs/symptoms of physical distress   Intensity THRR unchanged THRR unchanged THRR unchanged THRR unchanged THRR  unchanged     Progression   Progression Continue to progress workloads to maintain intensity without signs/symptoms of physical distress. Continue to progress workloads to maintain intensity without signs/symptoms of physical distress. Continue to progress workloads to maintain intensity without signs/symptoms of physical distress. Continue to progress workloads to maintain intensity without signs/symptoms of physical distress. Continue to progress workloads to maintain intensity without signs/symptoms of physical distress.     Resistance Training   Training Prescription Yes Yes Yes Yes Yes   Weight orange bands orange bands orange bands orange bands orange bands   Reps 10-12  10 minutes of strength training 10-12  10 minutes of strength training 10-12  10 minutes of strength training 10-12  10 minutes of strength training 10-12  10 minutes of strength training     Interval Training   Interval Training No No No No No     Oxygen   Oxygen Continuous Continuous Continuous  - Continuous   Liters 3 3 3   - 3     Recumbant Bike   Level 3 3 3 4 4    Minutes 17 17 17 17 17      NuStep   Level 4 4 4  4 4   Minutes 17 17 17 17 17    METs 2.4 2.4 2.2 2.2 2.4     Track   Laps 16  - 14 16  -   Minutes 17  - 17 17  -   Row Name 06/01/16 1217 06/03/16 1200 06/08/16 1200 06/10/16 1200 06/15/16 1200     Exercise Review   Progression  -  -  -  - Yes     Response to Exercise   Blood Pressure (Admit) 102/64 100/60 110/56 110/60 120/70   Blood Pressure (Exercise) 126/60 130/66 120/60 148/60 126/70   Blood Pressure (Exit) 108/56 118/64 106/60 114/62 118/68   Heart Rate (Admit) 63 bpm 54 bpm 66 bpm 63 bpm 60 bpm   Heart Rate (Exercise) 86 bpm 69 bpm 76 bpm 74 bpm 81 bpm   Heart Rate (Exit) 61 bpm 67 bpm 67 bpm 58 bpm 70 bpm   Oxygen Saturation (Admit) 91 % 95 % 95 % 94 % 94 %   Oxygen Saturation (Exercise) 87 % 86 % 88 % 90 % 88 %   Oxygen Saturation (Exit) 94 % 96 % 94 % 97 % 93 %   Rating of  Perceived Exertion (Exercise) 15 13 13 13 13    Perceived Dyspnea (Exercise) 3 3 1 2 2    Duration Progress to 45 minutes of aerobic exercise without signs/symptoms of physical distress Progress to 45 minutes of aerobic exercise without signs/symptoms of physical distress Progress to 45 minutes of aerobic exercise without signs/symptoms of physical distress Progress to 45 minutes of aerobic exercise without signs/symptoms of physical distress Progress to 45 minutes of aerobic exercise without signs/symptoms of physical distress   Intensity THRR unchanged THRR unchanged THRR unchanged THRR unchanged THRR unchanged     Progression   Progression Continue to progress workloads to maintain intensity without signs/symptoms of physical distress. Continue to progress workloads to maintain intensity without signs/symptoms of physical distress. Continue to progress workloads to maintain intensity without signs/symptoms of physical distress. Continue to progress workloads to maintain intensity without signs/symptoms of physical distress. Continue to progress workloads to maintain intensity without signs/symptoms of physical distress.     Resistance Training   Training Prescription Yes Yes Yes Yes Yes   Weight orange bands orange bands orange bands orange bands orange bands   Reps 10-12  10 minutes of strength training 10-12  10 minutes of strength training 10-12  10 minutes of strength training 10-12  10 minutes of strength training 10-12  10 minutes of strength training     Interval Training   Interval Training No No No No No     Oxygen   Oxygen Continuous Continuous  desaturating on track increased to 4 L Continuous Continuous Continuous   Liters 3 4 4 4 4      Recumbant Bike   Level 4  - 4 4 4    Minutes 17  - 17 17 17      NuStep   Level 4 4 4   - 5   Minutes 17 17 17   - 17   METs 2.2 2 2.3  - 2.3     Track   Laps 16 15 18 16 17    Minutes 17 17 17 17 17    [place] Name 06/22/16 1200              Response to Exercise   Blood Pressure (Admit) 127/74       Blood Pressure (Exercise) 140/58  Blood Pressure (Exit) 96/60       Heart Rate (Admit) 66 bpm       Heart Rate (Exercise) 78 bpm       Heart Rate (Exit) 67 bpm       Oxygen Saturation (Admit) 92 %       Oxygen Saturation (Exercise) 83 %  sat increased to 88% with rest       Oxygen Saturation (Exit) 93 %       Rating of Perceived Exertion (Exercise) 13       Perceived Dyspnea (Exercise) 3       Duration Progress to 45 minutes of aerobic exercise without signs/symptoms of physical distress       Intensity THRR unchanged         Progression   Progression Continue to progress workloads to maintain intensity without signs/symptoms of physical distress.         Resistance Training   Training Prescription Yes       Weight orange bands       Reps 10-12  10 minutes of strength training         Interval Training   Interval Training No         Oxygen   Oxygen Continuous       Liters 4         Recumbant Bike   Level 4       Minutes 17         NuStep   Level 5       Minutes 17       METs 2.4         Track   Laps 15       Minutes 17          Exercise Comments:     Exercise Comments    Row Name 04/06/16 0753 04/15/16 1328 05/03/16 1151 05/31/16 0938 06/22/16 1226   Exercise Comments Patient has only attended one exercise session. Will cont. to monitor.  Home exercise completed today Patient is steadily progressing in workload intensities. He is up to 15 laps on the walking track. Will cont. to monitor.  Patient consistently works hard and is open to workload progression. Will cont. to monitor.  Patient will be graduating on 07/01/16 from rehab. He is going to look into different options for his post grad exercise between now and the 9th. He was offered our maintenance program. Will follow up.   [place] Name 06/29/16 0720           Exercise Comments Patient is doing well in program. Will be graduating 07/06/16.  Will discuss post grad exercise plans.           Discharge Exercise Prescription (Final Exercise Prescription Changes):     Exercise Prescription Changes - 06/22/16 1200      Response to Exercise   Blood Pressure (Admit) 127/74   Blood Pressure (Exercise) 140/58   Blood Pressure (Exit) 96/60   Heart Rate (Admit) 66 bpm   Heart Rate (Exercise) 78 bpm   Heart Rate (Exit) 67 bpm   Oxygen Saturation (Admit) 92 %   Oxygen Saturation (Exercise) 83 %  sat increased to 88% with rest   Oxygen Saturation (Exit) 93 %   Rating of Perceived Exertion (Exercise) 13   Perceived Dyspnea (Exercise) 3   Duration Progress to 45 minutes of aerobic exercise without signs/symptoms of physical distress   Intensity THRR unchanged     Progression   Progression  Continue to progress workloads to maintain intensity without signs/symptoms of physical distress.     Resistance Training   Training Prescription Yes   Weight orange bands   Reps 10-12  10 minutes of strength training     Interval Training   Interval Training No     Oxygen   Oxygen Continuous   Liters 4     Recumbant Bike   Level 4   Minutes 17     NuStep   Level 5   Minutes 17   METs 2.4     Track   Laps 15   Minutes 17       Nutrition:  Target Goals: Understanding of nutrition guidelines, daily intake of sodium <1527m, cholesterol <2026m calories 30% from fat and 7% or less from saturated fats, daily to have 5 or more servings of fruits and vegetables.  Biometrics:     Pre Biometrics - 03/22/16 1039      Pre Biometrics   Grip Strength 33 kg       Nutrition Therapy Plan and Nutrition Goals:     Nutrition Therapy & Goals - 04/22/16 1409      Nutrition Therapy   Diet Gluten Free, High Calorie, High Protein, Low Sodium     Personal Nutrition Goals   Personal Goal #1 1-2 lb wt gain per week to a wt gain goal of 6-24 lb     Intervention Plan   Intervention Prescribe, educate and counsel regarding  individualized specific dietary modifications aiming towards targeted core components such as weight, hypertension, lipid management, diabetes, heart failure and other comorbidities.;Nutrition handout(s) given to patient.  High Calorie, High Protein diet; Suggestions for Increasing Calories and Protein; and High Calorie, High Protein recipes   Expected Outcomes Short Term Goal: Understand basic principles of dietary content, such as calories, fat, sodium, cholesterol and nutrients.;Long Term Goal: Adherence to prescribed nutrition plan.      Nutrition Discharge: Rate Your Plate Scores:     Nutrition Assessments - 04/08/16 1515      Rate Your Plate Scores   Pre Score 47      Psychosocial: Target Goals: Acknowledge presence or absence of depression, maximize coping skills, provide positive support system. Participant is able to verbalize types and ability to use techniques and skills needed for reducing stress and depression.  Initial Review & Psychosocial Screening:     Initial Psych Review & Screening - 03/22/16 1042      Initial Review   Current issues with --  none at this point     [place]   Concerns --  none at this point     Barriers   Psychosocial barriers to participate in program There are no identifiable barriers or psychosocial needs.     Screening Interventions   Interventions Encouraged to exercise      Quality of Life Scores:   PHQ-9: Recent Review Flowsheet Data    [facility] 04/16/2015 10/22/2013   Decreased Interest 0 0 0   Down, Depressed, Hopeless 0 0 0   PHQ - 2 Score 0 0 0      Psychosocial Evaluation and Intervention:     Psychosocial Evaluation - 03/22/16 1043      Psychosocial Evaluation & Interventions   Interventions Encouraged to exercise with the program and follow exercise prescription   Continued Psychosocial Services Needed No      Psychosocial Re-Evaluation:      Psychosocial  Re-Evaluation    Row Name 04/05/16 0910 05/04/16 0828 06/01/16 0801 06/28/16 1622       Psychosocial Re-Evaluation   Interventions Encouraged to attend Pulmonary Rehabilitation for the exercise Encouraged to attend Pulmonary Rehabilitation for the exercise Encouraged to attend Pulmonary Rehabilitation for the exercise Encouraged to attend Pulmonary Rehabilitation for the exercise    Comments no psycosocial barriers identified at this time no psycosocial barriers identified in the past 30 days no psycosocial barriers identified in the past 30 days no psycosocial barriers identified in the past 30 days      Education: Education Goals: Education classes will be provided on a weekly basis, covering required topics. Participant will state understanding/return demonstration of topics presented.  Learning Barriers/Preferences:     Learning Barriers/Preferences - 03/22/16 1035      Learning Barriers/Preferences   Learning Barriers None   Learning Preferences Group Instruction;Skilled Demonstration      Education Topics: Risk Factor Reduction:  -Group instruction that is supported by a PowerPoint presentation. Instructor discusses the definition of a risk factor, different risk factors for pulmonary disease, and how the heart and lungs work together.     Nutrition for Pulmonary Patient:  -Group instruction provided by PowerPoint slides, verbal discussion, and written materials to support subject matter. The instructor gives an explanation and review of healthy diet recommendations, which includes a discussion on weight management, recommendations for fruit and vegetable consumption, as well as protein, fluid, caffeine, fiber, sodium, sugar, and alcohol. Tips for eating when patients are short of breath are discussed. Flowsheet Row PULMONARY REHAB OTHER RESPIRATORY from 05/27/2016 in [place]  Date  04/01/16  Educator  edna  Instruction Review  Code  2- meets goals/outcomes      Pursed Lip Breathing:  -Group instruction that is supported by demonstration and informational handouts. Instructor discusses the benefits of pursed lip and diaphragmatic breathing and detailed demonstration on how to preform both.   Flowsheet Row PULMONARY REHAB OTHER RESPIRATORY from 06/10/2016 in [place]  Date  06/03/16  Educator  EP  Instruction Review Code  2- meets goals/outcomes      Oxygen Safety:  -Group instruction provided by PowerPoint, verbal discussion, and written material to support subject matter. There is an overview of "What is Oxygen" and "Why do we need it".  Instructor also reviews how to create a safe environment for oxygen use, the importance of using oxygen as prescribed, and the risks of noncompliance. There is a brief discussion on traveling with oxygen and resources the patient may utilize. Flowsheet Row PULMONARY REHAB OTHER RESPIRATORY from 05/27/2016 in [place]  Date  04/15/16  Educator  RN  Instruction Review Code  2- meets goals/outcomes      Oxygen Equipment:  -Group instruction provided by [facility] Staff utilizing handouts, written materials, and equipment demonstrations.   Signs and Symptoms:  -Group instruction provided by written material and verbal discussion to support subject matter. Warning signs and symptoms of infection, stroke, and heart attack are reviewed and when to call the physician/911 reinforced. Tips for preventing the spread of infection discussed.   Advanced Directives:  -Group instruction provided by verbal instruction and written material to support subject matter. Instructor reviews Advanced Directive laws and proper instruction for filling out document.   Pulmonary Video:  -Group video education that reviews the importance of medication and oxygen compliance, exercise, good nutrition, pulmonary hygiene, and pursed lip  and  diaphragmatic breathing for the pulmonary patient. Flowsheet Row PULMONARY REHAB OTHER RESPIRATORY from 05/27/2016 in [place]  Date  04/22/16  Instruction Review Code  2- meets goals/outcomes      Exercise for the Pulmonary Patient:  -Group instruction that is supported by a PowerPoint presentation. Instructor discusses benefits of exercise, core components of exercise, frequency, duration, and intensity of an exercise routine, importance of utilizing pulse oximetry during exercise, safety while exercising, and options of places to exercise outside of rehab.     Pulmonary Medications:  -Verbally interactive group education provided by instructor with focus on inhaled medications and proper administration.   Anatomy and Physiology of the Respiratory System and Intimacy:  -Group instruction provided by PowerPoint, verbal discussion, and written material to support subject matter. Instructor reviews respiratory cycle and anatomical components of the respiratory system and their functions. Instructor also reviews differences in obstructive and restrictive respiratory diseases with examples of each. Intimacy, Sex, and Sexuality differences are reviewed with a discussion on how relationships can change when diagnosed with pulmonary disease. Common sexual concerns are reviewed.   Knowledge Questionnaire Score:   Core Components/Risk Factors/Patient Goals at Admission:     Personal Goals and Risk Factors at Admission - 03/22/16 1039      Core Components/Risk Factors/Patient Goals on Admission   Increase Strength and Stamina Yes   Intervention Provide advice, education, support and counseling about physical activity/exercise needs.;Develop an individualized exercise prescription for aerobic and resistive training based on initial evaluation findings, risk stratification, comorbidities and participant's personal goals.   Expected Outcomes Achievement of  increased cardiorespiratory fitness and enhanced flexibility, muscular endurance and strength shown through measurements of functional capacity and personal statement of participant.   Improve shortness of breath with ADL's Yes   Intervention Provide education, individualized exercise plan and daily activity instruction to help decrease symptoms of SOB with activities of daily living.   Expected Outcomes Short Term: Achieves a reduction of symptoms when performing activities of daily living.      Core Components/Risk Factors/Patient Goals Review:      Goals and Risk Factor Review    Row Name 03/22/16 1041 04/05/16 0910 05/04/16 0827 06/01/16 0801 06/28/16 1622     Core Components/Risk Factors/Patient Goals Review   Personal Goals Review Increase Strength and Stamina;Improve shortness of breath with ADL's Increase Strength and Stamina;Improve shortness of breath with ADL's Increase Strength and Stamina;Improve shortness of breath with ADL's Increase Strength and Stamina;Improve shortness of breath with ADL's Increase Strength and Stamina;Improve shortness of breath with ADL's   Review Exercise will increase strength, stamina, and improve SOB Exercise will increase strength, stamina, and improve SOB see comments section on ITP see comments section on ITP see comments section on ITP   Expected Outcomes Exercise will increase strength, stamina, and improve SOB Exercise will increase strength, stamina, and improve SOB see admission expected outcomes see admission expected outcomes see admission expected outcomes      Core Components/Risk Factors/Patient Goals at Discharge (Final Review):      Goals and Risk Factor Review - 06/28/16 1622      Core Components/Risk Factors/Patient Goals Review   Personal Goals Review Increase Strength and Stamina;Improve shortness of breath with ADL's   Review see comments section on ITP   Expected Outcomes see admission expected outcomes      ITP  Comments:   Comments: ITP REVIEW Pt is making expected progress toward pulmonary rehab goals after completing 21 sessions. Recommend continued  exercise, life style modification, education, and utilization of breathing techniques to increase stamina and strength and decrease shortness of breath with exertion.

## 2016-06-30 ENCOUNTER — Ambulatory Visit (HOSPITAL_COMMUNITY)
Admission: RE | Admit: 2016-06-30 | Discharge: 2016-06-30 | Disposition: A | Discharge status: Home / Self Care | Payer: Medicare Other | Source: Ambulatory Visit | Attending: Internal Medicine | Admitting: Internal Medicine

## 2016-06-30 VITALS — BP 138/73 | HR 67 | Wt 131.1 lb

## 2016-06-30 DIAGNOSIS — Z9981 Dependence on supplemental oxygen: Secondary | ICD-10-CM | POA: Diagnosis not present

## 2016-06-30 DIAGNOSIS — I5042 Chronic combined systolic (congestive) and diastolic (congestive) heart failure: Secondary | ICD-10-CM

## 2016-06-30 DIAGNOSIS — I5022 Chronic systolic (congestive) heart failure: Secondary | ICD-10-CM | POA: Diagnosis not present

## 2016-06-30 DIAGNOSIS — Z823 Family history of stroke: Secondary | ICD-10-CM | POA: Diagnosis not present

## 2016-06-30 DIAGNOSIS — Z8521 Personal history of malignant neoplasm of larynx: Secondary | ICD-10-CM | POA: Insufficient documentation

## 2016-06-30 DIAGNOSIS — Z885 Allergy status to narcotic agent status: Secondary | ICD-10-CM | POA: Insufficient documentation

## 2016-06-30 DIAGNOSIS — J9610 Chronic respiratory failure, unspecified whether with hypoxia or hypercapnia: Secondary | ICD-10-CM | POA: Diagnosis not present

## 2016-06-30 DIAGNOSIS — I255 Ischemic cardiomyopathy: Secondary | ICD-10-CM

## 2016-06-30 DIAGNOSIS — I252 Old myocardial infarction: Secondary | ICD-10-CM | POA: Insufficient documentation

## 2016-06-30 DIAGNOSIS — I10 Essential (primary) hypertension: Secondary | ICD-10-CM | POA: Diagnosis not present

## 2016-06-30 DIAGNOSIS — E785 Hyperlipidemia, unspecified: Secondary | ICD-10-CM | POA: Diagnosis not present

## 2016-06-30 DIAGNOSIS — Z8719 Personal history of other diseases of the digestive system: Secondary | ICD-10-CM | POA: Insufficient documentation

## 2016-06-30 DIAGNOSIS — Z7982 Long term (current) use of aspirin: Secondary | ICD-10-CM | POA: Insufficient documentation

## 2016-06-30 DIAGNOSIS — I2510 Atherosclerotic heart disease of native coronary artery without angina pectoris: Secondary | ICD-10-CM | POA: Insufficient documentation

## 2016-06-30 DIAGNOSIS — N183 Chronic kidney disease, stage 3 (moderate): Secondary | ICD-10-CM | POA: Diagnosis not present

## 2016-06-30 DIAGNOSIS — K900 Celiac disease: Secondary | ICD-10-CM | POA: Diagnosis not present

## 2016-06-30 DIAGNOSIS — Z87891 Personal history of nicotine dependence: Secondary | ICD-10-CM | POA: Diagnosis not present

## 2016-06-30 DIAGNOSIS — K219 Gastro-esophageal reflux disease without esophagitis: Secondary | ICD-10-CM | POA: Diagnosis not present

## 2016-06-30 DIAGNOSIS — J449 Chronic obstructive pulmonary disease, unspecified: Secondary | ICD-10-CM | POA: Diagnosis not present

## 2016-06-30 DIAGNOSIS — Z8249 Family history of ischemic heart disease and other diseases of the circulatory system: Secondary | ICD-10-CM | POA: Diagnosis not present

## 2016-06-30 DIAGNOSIS — Z79899 Other long term (current) drug therapy: Secondary | ICD-10-CM | POA: Diagnosis not present

## 2016-06-30 DIAGNOSIS — I493 Ventricular premature depolarization: Secondary | ICD-10-CM | POA: Diagnosis not present

## 2016-06-30 DIAGNOSIS — J9611 Chronic respiratory failure with hypoxia: Secondary | ICD-10-CM

## 2016-06-30 DIAGNOSIS — I130 Hypertensive heart and chronic kidney disease with heart failure and stage 1 through stage 4 chronic kidney disease, or unspecified chronic kidney disease: Secondary | ICD-10-CM | POA: Insufficient documentation

## 2016-06-30 DIAGNOSIS — Z7951 Long term (current) use of inhaled steroids: Secondary | ICD-10-CM | POA: Insufficient documentation

## 2016-06-30 LAB — COMPREHENSIVE METABOLIC PANEL
ALK PHOS: 63 U/L (ref 38–126)
ALT: 38 U/L (ref 17–63)
ANION GAP: 8 (ref 5–15)
AST: 30 U/L (ref 15–41)
Albumin: 3.8 g/dL (ref 3.5–5.0)
BUN: 26 mg/dL — ABNORMAL HIGH (ref 6–20)
CALCIUM: 9.4 mg/dL (ref 8.9–10.3)
CHLORIDE: 105 mmol/L (ref 101–111)
CO2: 25 mmol/L (ref 22–32)
Creatinine, Ser: 1.43 mg/dL — ABNORMAL HIGH (ref 0.61–1.24)
GFR, EST AFRICAN AMERICAN: 52 mL/min — AB (ref >60)
GFR, EST NON AFRICAN AMERICAN: 45 mL/min — AB (ref >60)
Glucose, Bld: 89 mg/dL (ref 65–99)
Potassium: 4.9 mmol/L (ref 3.5–5.1)
SODIUM: 138 mmol/L (ref 135–145)
Total Bilirubin: 1 mg/dL (ref 0.3–1.2)
Total Protein: 5.8 g/dL — ABNORMAL LOW (ref 6.5–8.1)

## 2016-06-30 LAB — TSH: TSH: 2.929 u[IU]/mL (ref 0.350–4.500)

## 2016-06-30 MED ORDER — AMIODARONE HCL 200 MG PO TABS: 100.0000 mg | ORAL_TABLET | Freq: Every day | ORAL | 6 refills | Status: DC

## 2016-06-30 NOTE — Patient Instructions (Signed)
Decrease Amiodarone to 100 mg (1/2 tab) daily  Labs today  We will contact you in 4 months to schedule your next appointment.

## 2016-06-30 NOTE — Progress Notes (Signed)
Patient ID: Bruce Travis, male   DOB: 1935-07-27, 81 y.o.   MRN: [id number]   ADVANCED HF CLINIC NOTE  Chief Complaint:  PAC's and PVC's, abnormal ekg, DOE  Primary Care Physician: Odette Fraction, MD  HPI:  Bruce Travis is a 81 y.o. male with COPD (O2 dependent). HTN, HL and systolic HF fleto to be related to frequent PVCs.   Admitted in June 2017 with acute HF. Echo with EF 25%. Cath twith essentially normal coronaries. EF 20-25%. Low filling pressures but CO 2.5/1.5. Started on milrinone. 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 Started on amiodarone. PVCs went from ~20/minute to 1-2/minute by time of discharge. Milrinone weaned off.   Echo 04/12/16 LVEF 50-55%, Trivial AI, Mild Bruce, Mild LAE, PA peak pressure 46 mm Hg - much improved from previous.   Bruce Travis presents today for regular follow up. Feeling good overall.  Finished prednisone taper for URI on Saturday.  Taking mucinex. Feels a lot better, but still coughing. Finishes Pulmonary Rehab next Tuesday.  Looking to do maintenance program at very least.  Working with [employer].   Weight at home 125-127 still.  Denies lightheadedness or dizziness. Breathing a little difficult today with the weather, most of the time "gets along well". Raked the yard last week, had to take breaks and take his time.  Last year he couldn't do yard work at all.  Sleeps on 2 pillows chronically for comfort.  Still feels like energy is improving. No palpitations.   PMHx:  Past Medical History:  Diagnosis Date  . Allergic rhinitis   . Bronchiectasis   . CAD (coronary artery disease)   . Celiac disease   . Chronic heart failure ([place])   . Chronic kidney disease (CKD), stage III (moderate)   . COPD (chronic obstructive pulmonary disease) ([place])   . Diverticulosis of colon (without mention of hemorrhage)   . Esophageal reflux   . Family history of adverse reaction to anesthesia    daughter gets PONV  . Family hx of colon cancer   . History of stomach ulcers  1980s  . Hyperlipemia   . Hypertension   . 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 Myocardial infarction    "previous MI/echo in 12/2015"  . Nephrolithiasis    "got them now; never had OR/scopes" (02/03/2016)  . On home oxygen therapy    "2L; 24/7" (02/03/2016)  . Other diseases of lung, not elsewhere classified   . Personal history of colonic polyps 04/26/2007   hyperplastic   . Pneumonia "several times"    Past Surgical History:  Procedure Laterality Date  . CARDIAC CATHETERIZATION  02/03/2016  . CARDIAC CATHETERIZATION  09/30/2009   non-obstructive CAD w/30% narrowing in prox LAD (Dr. Waunita Schooner)  . CARDIAC CATHETERIZATION  05/04/2001   same at 2011 cath (Dr. Waunita Schooner)  . CARDIAC CATHETERIZATION N/A 02/03/2016   Procedure: Right/Left Heart Cath and Coronary Angiography;  Surgeon: Pixie Casino, MD;  Location: [facility];  Service: Cardiovascular;  Laterality: N/A;  . EPIGLOTOPLASTY W/ MLB     removal due to carcinoma  . EXCISIONAL HEMORRHOIDECTOMY  2000s  . HAND SURGERY Right 1958   d/t crush injury  . TONSILLECTOMY AND ADENOIDECTOMY    . ULTRASOUND GUIDANCE FOR VASCULAR ACCESS  02/03/2016   Procedure: Ultrasound Guidance For Vascular Access;  Surgeon: Pixie Casino, MD;  Location: [facility];  Service: Cardiovascular;;  . VASECTOMY  FAMHx:  Family History  Problem Relation Age of Onset  . Heart disease Father   . Colon cancer Father   . Prostate cancer Father   . Stroke Mother   . Endometrial cancer Sister   . Stroke Maternal Grandmother   . Cancer Paternal Grandmother     SOCHx:   reports that he quit smoking about 27 years ago. His smoking use included Cigarettes. He has a 70.00 pack-year smoking history. He quit smokeless tobacco use about 14 years ago. His smokeless tobacco use included Chew. He reports that he does not drink alcohol or use drugs.  ALLERGIES:  Allergies  Allergen Reactions  . Adhesive [Tape]  Other (See Comments)    Band-aids - tears skin off  . Codeine Other (See Comments)    Thought head was going to blow off  . Gluten Meal Other (See Comments)    Celiac disease    ROS: Pertinent items noted in HPI and remainder of comprehensive ROS otherwise negative.  HOME MEDS: Current Outpatient Prescriptions  Medication Sig Dispense Refill  . albuterol (PROAIR HFA) 108 (90 Base) MCG/ACT inhaler INHALE 2 PUFFS EVERY 4 HOURS AS NEEDED FOR SHORTNESS OF BREATH. 8.5 g 5  . AMBULATORY NON FORMULARY MEDICATION O2 2 Lpm continuous    . amiodarone (PACERONE) 200 MG tablet Take 1 tablet (200 mg total) by mouth daily. 30 tablet 6  . aspirin EC 81 MG tablet Take 81 mg by mouth daily.    Bruce Travis azelastine (ASTELIN) 0.1 % nasal spray Place 2 sprays into both nostrils 2 (two) times daily as needed for rhinitis or allergies.     . montelukast (SINGULAIR) 10 MG tablet TAKE 1 TABLET BY MOUTH DAILY. 90 tablet 0  . Multiple Vitamin (MULTIVITAMIN WITH MINERALS) TABS tablet Take 1 tablet by mouth daily.    . pantoprazole (PROTONIX) 40 MG tablet TAKE ONE TABLET BY MOUTH DAILY. 30 tablet 0  . polyethylene glycol (MIRALAX / GLYCOLAX) packet Take 17 g by mouth daily as needed for mild constipation.     . sacubitril-valsartan (ENTRESTO) 49-51 MG Take 1 tablet by mouth 2 (two) times daily. 60 tablet 6  . SPIRIVA HANDIHALER 18 MCG inhalation capsule INHALE 1 CAPSULE ONCE DAILY AS DIRECTED 30 capsule 11  . spironolactone (ALDACTONE) 25 MG tablet Take 0.5 tablets (12.5 mg total) by mouth daily. 16 tablet 6  . SYMBICORT 160-4.5 MCG/ACT inhaler INHALE 2 PUFFS FIRST THING IN MORNING AND THEN ANOTHER 2 PUFFS ABOUT 12 HOURS LATER. 10.2 g 11  . predniSONE (DELTASONE) 20 MG tablet 3 tabs poqday 1-2, 2 tabs poqday 3-4, 1 tab poqday 5-6 (Patient not taking: Reported on 06/30/2016) 12 tablet 0   No current facility-administered medications for this encounter.     LABS/IMAGING: No results found for this or any previous visit  (from the past 48 hour(s)). No results found.  WEIGHTS: Wt Readings from Last 3 Encounters:  06/30/16 131 lb 1.9 oz (59.5 kg)  06/29/16 128 lb 12 oz (58.4 kg)  06/22/16 128 lb 8.5 oz (58.3 kg)    VITALS: BP 138/73   Pulse 67   Wt 131 lb 1.9 oz (59.5 kg)   SpO2 93%   BMI 20.85 kg/m   Wt Readings from Last 3 Encounters:  06/30/16 131 lb 1.9 oz (59.5 kg)  06/29/16 128 lb 12 oz (58.4 kg)  06/22/16 128 lb 8.5 oz (58.3 kg)     EXAM: General: Elderly. Thin. NAD. O2 via [place].  HEENT: Normal Neck: supple.  JVD flat. Carotids 2+ bilat; no bruits. No thyromegaly or nodule noted.  Cor: PMI nonpalpable. Distant. Regular. No obvious ectopy.  Lungs: Diminished throughout. No wheezing.  Abdomen: soft, NT, ND, no HSM. No bruits or masses. +BS  Extremities: no cyanosis, clubbing, rash. No peripheral edema.  Neuro: alert & orientedx3, cranial nerves grossly intact. moves all 4 extremities w/o difficulty. Affect pleasant  EKG: NSR 60 RBBB  No PVCs  ASSESSMENT: 1. 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 PVC related cardiomyopathy 3. Chronic respiratory failure due to COPD on home O2 4. HTN  EF now normal by Echo 04/12/16 LVEF 50-55%, Trivial AI, Mild Bruce, Mild LAE, PA peak pressure 46 mm Hg - much improved from previous.   NYHA II-III now but mainly due to lung disease. PVCs suppressed. Volume status stable on exam.    Decrease amiodarone to 100 mg daily.    Continue Entresto 49/51 and spiro 12.5 mg daily.  Will not uptitrate with age and borderline Hyperkalemia.    Satira Mccallum Tillery PA-C 06/30/2016, 11:42 AM   Patient seen and examined with Oda Kilts, PA-C. We discussed all aspects of the encounter. I agree with the assessment and plan as stated above.   EF back to normal with PVC suppression. No significant HF. Can drop amio to 100 daily. Will continue to follow. With severe COPD may try to wean amio to 60m daily in  future.  Aurel Nguyen,MD 11:43 PM

## 2016-07-01 ENCOUNTER — Encounter (HOSPITAL_COMMUNITY)
Admission: RE | Admit: 2016-07-01 | Discharge: 2016-07-01 | Disposition: A | Discharge status: Home / Self Care | Payer: Medicare Other | Source: Ambulatory Visit | Attending: Internal Medicine | Admitting: Internal Medicine

## 2016-07-01 ENCOUNTER — Other Ambulatory Visit: Payer: Self-pay | Admitting: Family Medicine

## 2016-07-01 VITALS — Wt 128.7 lb

## 2016-07-01 DIAGNOSIS — I5021 Acute systolic (congestive) heart failure: Secondary | ICD-10-CM

## 2016-07-01 NOTE — Progress Notes (Signed)
Daily Session Note  Patient Details  Name: Bruce Travis MRN: [id number] Date of Birth: [birth year] Referring Provider:   April Manson Pulmonary Rehab Walk Test from 03/25/2016 in [place]  Referring Provider  Dr. Haroldine Laws      Encounter Date: 07/01/2016  Check In:     Session Check In - 07/01/16 1034      Check-In   Location [facility]   Staff Present Rosebud Poles, RN, BSN;Aryaan Persichetti, MS, ACSM RCEP, Exercise Physiologist;Lisa Ysidro Evert, Felipe Drone, RN, MHA;Portia Rollene Rotunda, RN, BSN   Supervising physician immediately available to respond to emergencies [facility] immediately available   Physician(s) Dr. Tana Coast   Medication changes reported     No   Fall or balance concerns reported    No   Warm-up and Cool-down Performed as group-led instruction   Resistance Training Performed Yes   VAD Patient? No     Pain Assessment   Currently in Pain? No/denies   Multiple Pain Sites No      Capillary Blood Glucose: No results found for this or any previous visit (from the past 24 hour(s)).      Exercise Prescription Changes - 07/01/16 1300      Response to Exercise   Blood Pressure (Admit) 104/56   Blood Pressure (Exercise) 136/76   Blood Pressure (Exit) 110/64   Heart Rate (Admit) 70 bpm   Heart Rate (Exercise) 78 bpm   Heart Rate (Exit) 63 bpm   Oxygen Saturation (Admit) 93 %   Oxygen Saturation (Exercise) 88 %  sat increased to 88% with rest   Oxygen Saturation (Exit) 95 %   Rating of Perceived Exertion (Exercise) 13   Perceived Dyspnea (Exercise) 2   Duration Progress to 45 minutes of aerobic exercise without signs/symptoms of physical distress   Intensity THRR unchanged     Progression   Progression Continue to progress workloads to maintain intensity without signs/symptoms of physical distress.     Resistance Training   Training Prescription Yes   Weight orange bands   Reps 10-12  10 minutes of  strength training     Interval Training   Interval Training No     Oxygen   Oxygen Continuous   Liters 4     NuStep   Level 5   Minutes 17   METs 2.5     Track   Laps 15   Minutes 17     Goals Met:  Exercise tolerated well No report of cardiac concerns or symptoms Strength training completed today  Goals Unmet:  Not Applicable  Comments: Service time is from 10:30am to 12:30pm    Dr. Rush Farmer is Medical Director for Pulmonary Rehab at [facility].

## 2016-07-02 NOTE — Telephone Encounter (Signed)
Patient seen and evaluated on 06/30/16 by Jonetta Osgood [place]

## 2016-07-06 ENCOUNTER — Encounter (HOSPITAL_COMMUNITY)
Admission: RE | Admit: 2016-07-06 | Discharge: 2016-07-06 | Disposition: A | Discharge status: Home / Self Care | Payer: Medicare Other | Source: Ambulatory Visit | Attending: Internal Medicine | Admitting: Internal Medicine

## 2016-07-06 DIAGNOSIS — I5021 Acute systolic (congestive) heart failure: Secondary | ICD-10-CM

## 2016-07-08 ENCOUNTER — Encounter (HOSPITAL_COMMUNITY): Payer: Medicare Other

## 2016-07-13 ENCOUNTER — Encounter (HOSPITAL_COMMUNITY): Payer: Medicare Other

## 2016-07-20 ENCOUNTER — Ambulatory Visit (INDEPENDENT_AMBULATORY_CARE_PROVIDER_SITE_OTHER): Payer: Medicare Other | Admitting: Family Medicine

## 2016-07-20 ENCOUNTER — Other Ambulatory Visit: Payer: Self-pay | Admitting: Family Medicine

## 2016-07-20 ENCOUNTER — Encounter: Payer: Self-pay | Admitting: Family Medicine

## 2016-07-20 VITALS — BP 134/88 | HR 80 | Temp 97.8°F | Resp 20 | Ht 66.5 in | Wt 133.0 lb

## 2016-07-20 DIAGNOSIS — H833X3 Noise effects on inner ear, bilateral: Secondary | ICD-10-CM

## 2016-07-20 DIAGNOSIS — Z23 Encounter for immunization: Secondary | ICD-10-CM | POA: Diagnosis not present

## 2016-07-20 NOTE — Progress Notes (Signed)
Subjective:    Patient ID: Bruce Travis, male    DOB: [birth year], 81 y.o.   MRN: [id number]  HPI  Patient reports that both the ears feel stopped up. He reports hearing loss in both ears. He denies any otalgia or fevers. However he has been dealing with head congestion recently. He has been taking Afrin with some success. He denies any sinus pain or sinus pressure. Primarily having rhinorrhea and postnasal drip. On examination today there is absolutely no wax seen in his auditory canals. The tympanic membranes are pearly gray. There is no middle ear effusion that I can appreciate on exam. Past Medical History:  Diagnosis Date  . Allergic rhinitis   . Bronchiectasis   . CAD (coronary artery disease)   . Celiac disease   . Chronic heart failure ([place])   . Chronic kidney disease (CKD), stage III (moderate)   . COPD (chronic obstructive pulmonary disease) ([place])   . Diverticulosis of colon (without mention of hemorrhage)   . Esophageal reflux   . Family history of adverse reaction to anesthesia    daughter gets PONV  . Family hx of colon cancer   . History of stomach ulcers 1980s  . Hyperlipemia   . Hypertension   . 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 Myocardial infarction    "previous MI/echo in 12/2015"  . Nephrolithiasis    "got them now; never had OR/scopes" (02/03/2016)  . On home oxygen therapy    "2L; 24/7" (02/03/2016)  . Other diseases of lung, not elsewhere classified   . Personal history of colonic polyps 04/26/2007   hyperplastic   . Pneumonia "several times"   Past Surgical History:  Procedure Laterality Date  . CARDIAC CATHETERIZATION  02/03/2016  . CARDIAC CATHETERIZATION  09/30/2009   non-obstructive CAD w/30% narrowing in prox LAD (Dr. Waunita Schooner)  . CARDIAC CATHETERIZATION  05/04/2001   same at 2011 cath (Dr. Waunita Schooner)  . CARDIAC CATHETERIZATION N/A 02/03/2016   Procedure: Right/Left Heart Cath and Coronary Angiography;  Surgeon: Pixie Casino, MD;  Location: [facility];  Service: Cardiovascular;  Laterality: N/A;  . EPIGLOTOPLASTY W/ MLB     removal due to carcinoma  . EXCISIONAL HEMORRHOIDECTOMY  2000s  . HAND SURGERY Right 1958   d/t crush injury  . TONSILLECTOMY AND ADENOIDECTOMY    . ULTRASOUND GUIDANCE FOR VASCULAR ACCESS  02/03/2016   Procedure: Ultrasound Guidance For Vascular Access;  Surgeon: Pixie Casino, MD;  Location: [facility];  Service: Cardiovascular;;  . VASECTOMY     Current Outpatient Prescriptions on File Prior to Visit  Medication Sig Dispense Refill  . albuterol (PROAIR HFA) 108 (90 Base) MCG/ACT inhaler INHALE 2 PUFFS EVERY 4 HOURS AS NEEDED FOR SHORTNESS OF BREATH. 8.5 g 5  . AMBULATORY NON FORMULARY MEDICATION O2 2 Lpm continuous    . amiodarone (PACERONE) 200 MG tablet Take 0.5 tablets (100 mg total) by mouth daily. 30 tablet 6  . aspirin EC 81 MG tablet Take 81 mg by mouth daily.    Marland Kitchen azelastine (ASTELIN) 0.1 % nasal spray PLACE 2 SPRAYS INTO EACH NOSTRILS TWICE DAILY AS DIRECTED. 30 mL 0  . montelukast (SINGULAIR) 10 MG tablet TAKE 1 TABLET BY MOUTH DAILY. 90 tablet 0  . Multiple Vitamin (MULTIVITAMIN WITH MINERALS) TABS tablet Take 1 tablet by mouth daily.    . pantoprazole (PROTONIX) 40 MG tablet TAKE ONE TABLET BY MOUTH DAILY.  30 tablet 0  . polyethylene glycol (MIRALAX / GLYCOLAX) packet Take 17 g by mouth daily as needed for mild constipation.     . sacubitril-valsartan (ENTRESTO) 49-51 MG Take 1 tablet by mouth 2 (two) times daily. 60 tablet 6  . SPIRIVA HANDIHALER 18 MCG inhalation capsule INHALE 1 CAPSULE ONCE DAILY AS DIRECTED 30 capsule 11  . spironolactone (ALDACTONE) 25 MG tablet Take 0.5 tablets (12.5 mg total) by mouth daily. 16 tablet 6  . SYMBICORT 160-4.5 MCG/ACT inhaler INHALE 2 PUFFS FIRST THING IN MORNING AND THEN ANOTHER 2 PUFFS ABOUT 12 HOURS LATER. 10.2 g 11   No current facility-administered medications on file prior to visit.    Allergies    Allergen Reactions  . Adhesive [Tape] Other (See Comments)    Band-aids - tears skin off  . Codeine Other (See Comments)    Thought head was going to blow off  . Gluten Meal Other (See Comments)    Celiac disease   Social History   Social History  . Marital status: Married    Spouse name: N/A  . Number of children: 3  . Years of education: N/A   Occupational History  . retired Retired    Psychologist, sport and exercise and Administrator   Social History Main Topics  . Smoking status: Former Smoker    Packs/day: 2.00    Years: 35.00    Types: Cigarettes    Quit date: 01/21/1989  . Smokeless tobacco: Former Systems developer    Types: Chew    Quit date: 08/23/2001  . Alcohol use No  . Drug use: No  . Sexual activity: Not on file   Other Topics Concern  . Not on file   Social History Narrative  . No narrative on file     Review of Systems  All other systems reviewed and are negative.      Objective:   Physical Exam  HENT:  Right Ear: Tympanic membrane and ear canal normal. No drainage, swelling or tenderness. No foreign bodies. Tympanic membrane is not injected, not scarred, not perforated, not erythematous, not retracted and not bulging. No middle ear effusion. Decreased hearing is noted.  Left Ear: Tympanic membrane and ear canal normal. No drainage, swelling or tenderness. Tympanic membrane is not injected, not scarred, not perforated, not erythematous, not retracted and not bulging.  No middle ear effusion. Decreased hearing is noted.  Neck: No JVD present.  Cardiovascular: Normal rate, regular rhythm and normal heart sounds.   Pulmonary/Chest: Effort normal. No respiratory distress. He has decreased breath sounds. He has no wheezes. He has no rhonchi. He has no rales.  Musculoskeletal: He exhibits no edema.  Vitals reviewed.         Assessment & Plan:  Patient has bilateral hearing difficulties. This is chronic however he feels that recently it has worsened. I recommended that he have his  hearing aids evaluated. He may also need to see an audiologist as he may have worsening presbycusis. He can also try Flonase 2 sprays each nostril daily as well as Zyrtec given his head congestion and rhinorrhea that he is having recently as this may be causing an element of eustachian tube dysfunction contributing to his symptoms

## 2016-07-22 ENCOUNTER — Encounter (HOSPITAL_COMMUNITY): Payer: Self-pay

## 2016-07-22 DIAGNOSIS — I5021 Acute systolic (congestive) heart failure: Secondary | ICD-10-CM

## 2016-07-22 NOTE — Progress Notes (Signed)
Discharge Summary  Patient Details  Name: Bruce Travis MRN: [id number] Date of Birth: Sep 23, 1934 Referring Provider:   April Manson [facility] Walk Test from 03/25/2016 in [place]  Referring Provider  Dr. Haroldine Laws       Number of Visits: 23  Reason for Discharge:  Patient reached a stable level of exercise. Patient independent in their exercise.  Smoking History:  History  Smoking Status  . Former Smoker  . Packs/day: 2.00  . Years: 35.00  . Types: Cigarettes  . Quit date: 01/21/1989  Smokeless Tobacco  . Former Systems developer  . Types: Chew  . Quit date: 08/23/2001    Diagnosis:  Acute systolic heart failure ([place])  ADL UCSD:     Pulmonary Assessment Scores    Row Name 07/01/16 1649         ADL UCSD   ADL Phase Exit     SOB Score total 71        Initial Exercise Prescription:   Discharge Exercise Prescription (Final Exercise Prescription Changes):     Exercise Prescription Changes - 07/01/16 1300      Response to Exercise   Blood Pressure (Admit) 104/56   Blood Pressure (Exercise) 136/76   Blood Pressure (Exit) 110/64   Heart Rate (Admit) 70 bpm   Heart Rate (Exercise) 78 bpm   Heart Rate (Exit) 63 bpm   Oxygen Saturation (Admit) 93 %   Oxygen Saturation (Exercise) 88 %  sat increased to 88% with rest   Oxygen Saturation (Exit) 95 %   Rating of Perceived Exertion (Exercise) 13   Perceived Dyspnea (Exercise) 2   Duration Progress to 45 minutes of aerobic exercise without signs/symptoms of physical distress   Intensity THRR unchanged     Progression   Progression Continue to progress workloads to maintain intensity without signs/symptoms of physical distress.     Resistance Training   Training Prescription Yes   Weight orange bands   Reps 10-12  10 minutes of strength training     Interval Training   Interval Training No     Oxygen   Oxygen Continuous   Liters 4     NuStep   Level 5   Minutes 17   METs 2.5      Track   Laps 15   Minutes 17      Functional Capacity:     6 Minute Walk    Row Name 07/06/16 1312         6 Minute Walk   Phase Discharge     Distance 1655 feet     Walk Time 6 minutes     # of Rest Breaks 0     MPH 3.13     METS 3.37     RPE 14     Perceived Dyspnea  3     Symptoms No     Resting HR 72 bpm     Resting BP 108/60     Max Ex. HR 99 bpm     Max Ex. BP 174/60     2 Minute Post BP 144/60       Interval HR   Baseline HR 72     1 Minute HR 96     2 Minute HR 96     3 Minute HR 81     4 Minute HR 81     5 Minute HR 90     6 Minute HR 99  2 Minute Post HR 85     Interval Heart Rate? Yes       Interval Oxygen   Interval Oxygen? Yes     Baseline Oxygen Saturation % 93 %     Baseline Liters of Oxygen 4 L     1 Minute Oxygen Saturation % 91 %     1 Minute Liters of Oxygen 4 L     2 Minute Oxygen Saturation % 91 %     2 Minute Liters of Oxygen 4 L     3 Minute Oxygen Saturation % 86 %     3 Minute Liters of Oxygen 4 L     4 Minute Oxygen Saturation % 86 %     4 Minute Liters of Oxygen 6 L     5 Minute Oxygen Saturation % 85 %     5 Minute Liters of Oxygen 6 L     6 Minute Oxygen Saturation % 85 %     6 Minute Liters of Oxygen 6 L     2 Minute Post Oxygen Saturation % 92 %     2 Minute Post Liters of Oxygen 6 L        Psychological, QOL, Others - Outcomes: PHQ 2/9: Depression screen [facility] 2/9 07/06/2016 03/22/2016 04/16/2015 10/22/2013  Decreased Interest 0 0 0 0  Down, Depressed, Hopeless 0 0 0 0  PHQ - 2 Score 0 0 0 0    Quality of Life:     Quality of Life - 07/01/16 1650      Quality of Life Scores   Health/Function Post 18.63 %   Socioeconomic Post 21.79 %   Psych/Spiritual Post 22.5 %   Family Post 22.25 %   GLOBAL Post 20.5 %      Personal Goals: Goals established at orientation with interventions provided to work toward goal.    Personal Goals Discharge:     Goals and Risk Factor Review    Row Name 06/01/16 0801  06/28/16 1622           Core Components/Risk Factors/Patient Goals Review   Personal Goals Review Increase Strength and Stamina;Improve shortness of breath with ADL's Increase Strength and Stamina;Improve shortness of breath with ADL's      Review see comments section on ITP see comments section on ITP      Expected Outcomes see admission expected outcomes see admission expected outcomes         Nutrition & Weight - Outcomes:    Nutrition:   Nutrition Discharge:     Nutrition Assessments - 07/21/16 1041      Rate Your Plate Scores   Pre Score 47   Post Score 33      Education Questionnaire Score:     Knowledge Questionnaire Score - 07/01/16 1649      Knowledge Questionnaire Score   Pre Score 9/13     Patient has been discharged pulmonary rehab. He has met all of his rehab goals.

## 2016-08-03 ENCOUNTER — Encounter: Payer: Self-pay | Admitting: Family Medicine

## 2016-08-03 ENCOUNTER — Ambulatory Visit (INDEPENDENT_AMBULATORY_CARE_PROVIDER_SITE_OTHER): Payer: Medicare Other | Admitting: Family Medicine

## 2016-08-03 VITALS — BP 120/60 | HR 74 | Temp 98.5°F | Resp 18 | Ht 66.5 in | Wt 132.0 lb

## 2016-08-03 DIAGNOSIS — J438 Other emphysema: Secondary | ICD-10-CM | POA: Diagnosis not present

## 2016-08-03 DIAGNOSIS — J069 Acute upper respiratory infection, unspecified: Secondary | ICD-10-CM

## 2016-08-03 NOTE — Progress Notes (Signed)
Subjective:    Patient ID: Bruce Travis, male    DOB: [birth year], 81 y.o.   MRN: [id number]  HPI Has a well documented history of severe emphysema with exacerbations triggered by URI.  The patient's wife had a cold last week.  He has now developed congestion, dry cough, post nasal drip and a sore throat.  No fever, chills, hemoptysis, or sputum production.  Slightly more SOB from baseline.  Exam is unremarkable.   Past Medical History:  Diagnosis Date  . Allergic rhinitis   . Bronchiectasis   . CAD (coronary artery disease)   . Celiac disease   . Chronic heart failure ([place])   . Chronic kidney disease (CKD), stage III (moderate)   . COPD (chronic obstructive pulmonary disease) ([place])   . Diverticulosis of colon (without mention of hemorrhage)   . Esophageal reflux   . Family history of adverse reaction to anesthesia    daughter gets PONV  . Family hx of colon cancer   . History of stomach ulcers 1980s  . Hyperlipemia   . Hypertension   . 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 Myocardial infarction    "previous MI/echo in 12/2015"  . Nephrolithiasis    "got them now; never had OR/scopes" (02/03/2016)  . On home oxygen therapy    "2L; 24/7" (02/03/2016)  . Other diseases of lung, not elsewhere classified   . Personal history of colonic polyps 04/26/2007   hyperplastic   . Pneumonia "several times"   Past Surgical History:  Procedure Laterality Date  . CARDIAC CATHETERIZATION  02/03/2016  . CARDIAC CATHETERIZATION  09/30/2009   non-obstructive CAD w/30% narrowing in prox LAD (Dr. Waunita Schooner)  . CARDIAC CATHETERIZATION  05/04/2001   same at 2011 cath (Dr. Waunita Schooner)  . CARDIAC CATHETERIZATION N/A 02/03/2016   Procedure: Right/Left Heart Cath and Coronary Angiography;  Surgeon: Pixie Casino, MD;  Location: [facility];  Service: Cardiovascular;  Laterality: N/A;  . EPIGLOTOPLASTY W/ MLB     removal due to carcinoma  . EXCISIONAL HEMORRHOIDECTOMY   2000s  . HAND SURGERY Right 1958   d/t crush injury  . TONSILLECTOMY AND ADENOIDECTOMY    . ULTRASOUND GUIDANCE FOR VASCULAR ACCESS  02/03/2016   Procedure: Ultrasound Guidance For Vascular Access;  Surgeon: Pixie Casino, MD;  Location: [facility];  Service: Cardiovascular;;  . VASECTOMY     Current Outpatient Prescriptions on File Prior to Visit  Medication Sig Dispense Refill  . albuterol (PROAIR HFA) 108 (90 Base) MCG/ACT inhaler INHALE 2 PUFFS EVERY 4 HOURS AS NEEDED FOR SHORTNESS OF BREATH. 8.5 g 5  . AMBULATORY NON FORMULARY MEDICATION O2 2 Lpm continuous    . amiodarone (PACERONE) 200 MG tablet Take 0.5 tablets (100 mg total) by mouth daily. 30 tablet 6  . aspirin EC 81 MG tablet Take 81 mg by mouth daily.    Marland Kitchen azelastine (ASTELIN) 0.1 % nasal spray PLACE 2 SPRAYS INTO EACH NOSTRILS TWICE DAILY AS DIRECTED. 30 mL 0  . montelukast (SINGULAIR) 10 MG tablet TAKE 1 TABLET BY MOUTH DAILY. 90 tablet 0  . Multiple Vitamin (MULTIVITAMIN WITH MINERALS) TABS tablet Take 1 tablet by mouth daily.    . pantoprazole (PROTONIX) 40 MG tablet TAKE ONE TABLET BY MOUTH DAILY. 30 tablet 0  . polyethylene glycol (MIRALAX / GLYCOLAX) packet Take 17 g by mouth daily as needed for mild constipation.     . sacubitril-valsartan (  ENTRESTO) 49-51 MG Take 1 tablet by mouth 2 (two) times daily. 60 tablet 6  . SPIRIVA HANDIHALER 18 MCG inhalation capsule INHALE 1 CAPSULE ONCE DAILY AS DIRECTED 30 capsule 11  . spironolactone (ALDACTONE) 25 MG tablet Take 0.5 tablets (12.5 mg total) by mouth daily. 16 tablet 6  . SYMBICORT 160-4.5 MCG/ACT inhaler INHALE 2 PUFFS FIRST THING IN MORNING AND THEN ANOTHER 2 PUFFS ABOUT 12 HOURS LATER. 10.2 g 11   No current facility-administered medications on file prior to visit.    Allergies  Allergen Reactions  . Adhesive [Tape] Other (See Comments)    Band-aids - tears skin off  . Codeine Other (See Comments)    Thought head was going to blow off  . Gluten Meal  Other (See Comments)    Celiac disease   Social History   Social History  . Marital status: Married    Spouse name: N/A  . Number of children: 3  . Years of education: N/A   Occupational History  . retired Retired    Psychologist, sport and exercise and Administrator   Social History Main Topics  . Smoking status: Former Smoker    Packs/day: 2.00    Years: 35.00    Types: Cigarettes    Quit date: 01/21/1989  . Smokeless tobacco: Former Systems developer    Types: Chew    Quit date: 08/23/2001  . Alcohol use No  . Drug use: No  . Sexual activity: Not on file   Other Topics Concern  . Not on file   Social History Narrative  . No narrative on file      Review of Systems  All other systems reviewed and are negative.      Objective:   Physical Exam  HENT:  Right Ear: Tympanic membrane and ear canal normal.  Left Ear: Tympanic membrane and ear canal normal.  Nose: No mucosal edema or rhinorrhea.  Mouth/Throat: Posterior oropharyngeal erythema present.  Cardiovascular: Normal rate, regular rhythm and normal heart sounds.   No murmur heard. Pulmonary/Chest: Effort normal. He has decreased breath sounds. He has no wheezes. He has no rales. He exhibits no tenderness.  Vitals reviewed.         Assessment & Plan:  URI- symtoms consistent with viral URI.  REcommended tincture of time with low threshold to add levaquin if increased sputum production, change in sputum character, worsening SOB.  Also start prednisone 10 mg poqday given his track record for 5 days until URI hopefully subsides.

## 2016-08-06 ENCOUNTER — Ambulatory Visit (INDEPENDENT_AMBULATORY_CARE_PROVIDER_SITE_OTHER): Payer: Medicare Other | Admitting: Family Medicine

## 2016-08-06 ENCOUNTER — Encounter: Payer: Self-pay | Admitting: Family Medicine

## 2016-08-06 ENCOUNTER — Encounter (HOSPITAL_COMMUNITY): Payer: Self-pay

## 2016-08-06 ENCOUNTER — Emergency Department (HOSPITAL_COMMUNITY): Payer: Medicare Other

## 2016-08-06 ENCOUNTER — Inpatient Hospital Stay (HOSPITAL_COMMUNITY)
Admission: EM | Admit: 2016-08-06 | Discharge: 2016-08-09 | DRG: 193 | Disposition: A | Discharge status: Home Health | Payer: Medicare Other | Attending: Internal Medicine | Admitting: Internal Medicine

## 2016-08-06 VITALS — BP 166/68 | HR 100 | Temp 98.4°F | Resp 24 | Ht 66.5 in

## 2016-08-06 DIAGNOSIS — N183 Chronic kidney disease, stage 3 (moderate): Secondary | ICD-10-CM | POA: Diagnosis not present

## 2016-08-06 DIAGNOSIS — J9621 Acute and chronic respiratory failure with hypoxia: Secondary | ICD-10-CM | POA: Diagnosis present

## 2016-08-06 DIAGNOSIS — K5900 Constipation, unspecified: Secondary | ICD-10-CM | POA: Diagnosis present

## 2016-08-06 DIAGNOSIS — J440 Chronic obstructive pulmonary disease with acute lower respiratory infection: Secondary | ICD-10-CM | POA: Diagnosis not present

## 2016-08-06 DIAGNOSIS — Z7982 Long term (current) use of aspirin: Secondary | ICD-10-CM

## 2016-08-06 DIAGNOSIS — R0602 Shortness of breath: Secondary | ICD-10-CM | POA: Diagnosis not present

## 2016-08-06 DIAGNOSIS — J309 Allergic rhinitis, unspecified: Secondary | ICD-10-CM | POA: Diagnosis present

## 2016-08-06 DIAGNOSIS — J181 Lobar pneumonia, unspecified organism: Secondary | ICD-10-CM

## 2016-08-06 DIAGNOSIS — J441 Chronic obstructive pulmonary disease with (acute) exacerbation: Secondary | ICD-10-CM | POA: Diagnosis present

## 2016-08-06 DIAGNOSIS — E785 Hyperlipidemia, unspecified: Secondary | ICD-10-CM | POA: Diagnosis present

## 2016-08-06 DIAGNOSIS — Z8521 Personal history of malignant neoplasm of larynx: Secondary | ICD-10-CM

## 2016-08-06 DIAGNOSIS — J189 Pneumonia, unspecified organism: Secondary | ICD-10-CM | POA: Diagnosis present

## 2016-08-06 DIAGNOSIS — Z8711 Personal history of peptic ulcer disease: Secondary | ICD-10-CM | POA: Diagnosis not present

## 2016-08-06 DIAGNOSIS — I5022 Chronic systolic (congestive) heart failure: Secondary | ICD-10-CM | POA: Diagnosis present

## 2016-08-06 DIAGNOSIS — Z91018 Allergy to other foods: Secondary | ICD-10-CM | POA: Diagnosis not present

## 2016-08-06 DIAGNOSIS — I252 Old myocardial infarction: Secondary | ICD-10-CM

## 2016-08-06 DIAGNOSIS — J069 Acute upper respiratory infection, unspecified: Secondary | ICD-10-CM | POA: Diagnosis not present

## 2016-08-06 DIAGNOSIS — Z9981 Dependence on supplemental oxygen: Secondary | ICD-10-CM | POA: Diagnosis not present

## 2016-08-06 DIAGNOSIS — I10 Essential (primary) hypertension: Secondary | ICD-10-CM | POA: Diagnosis not present

## 2016-08-06 DIAGNOSIS — Z885 Allergy status to narcotic agent status: Secondary | ICD-10-CM

## 2016-08-06 DIAGNOSIS — Z91048 Other nonmedicinal substance allergy status: Secondary | ICD-10-CM

## 2016-08-06 DIAGNOSIS — K219 Gastro-esophageal reflux disease without esophagitis: Secondary | ICD-10-CM | POA: Diagnosis present

## 2016-08-06 DIAGNOSIS — I2510 Atherosclerotic heart disease of native coronary artery without angina pectoris: Secondary | ICD-10-CM | POA: Diagnosis present

## 2016-08-06 DIAGNOSIS — Z87891 Personal history of nicotine dependence: Secondary | ICD-10-CM | POA: Diagnosis not present

## 2016-08-06 DIAGNOSIS — Z7952 Long term (current) use of systemic steroids: Secondary | ICD-10-CM | POA: Diagnosis not present

## 2016-08-06 DIAGNOSIS — Z7951 Long term (current) use of inhaled steroids: Secondary | ICD-10-CM

## 2016-08-06 DIAGNOSIS — Z66 Do not resuscitate: Secondary | ICD-10-CM | POA: Diagnosis present

## 2016-08-06 DIAGNOSIS — J9601 Acute respiratory failure with hypoxia: Secondary | ICD-10-CM | POA: Diagnosis not present

## 2016-08-06 DIAGNOSIS — I130 Hypertensive heart and chronic kidney disease with heart failure and stage 1 through stage 4 chronic kidney disease, or unspecified chronic kidney disease: Secondary | ICD-10-CM | POA: Diagnosis present

## 2016-08-06 LAB — COMPREHENSIVE METABOLIC PANEL
ALT: 38 U/L (ref 17–63)
ANION GAP: 10 (ref 5–15)
AST: 34 U/L (ref 15–41)
Albumin: 3.4 g/dL — ABNORMAL LOW (ref 3.5–5.0)
Alkaline Phosphatase: 57 U/L (ref 38–126)
BUN: 19 mg/dL (ref 6–20)
CHLORIDE: 102 mmol/L (ref 101–111)
CO2: 24 mmol/L (ref 22–32)
Calcium: 9.3 mg/dL (ref 8.9–10.3)
Creatinine, Ser: 1.26 mg/dL — ABNORMAL HIGH (ref 0.61–1.24)
GFR calc non Af Amer: 52 mL/min — ABNORMAL LOW (ref >60)
Glucose, Bld: 101 mg/dL — ABNORMAL HIGH (ref 65–99)
Potassium: 4.5 mmol/L (ref 3.5–5.1)
SODIUM: 136 mmol/L (ref 135–145)
Total Bilirubin: 1 mg/dL (ref 0.3–1.2)
Total Protein: 6.2 g/dL — ABNORMAL LOW (ref 6.5–8.1)

## 2016-08-06 LAB — CBC
HCT: 45.6 % (ref 39.0–52.0)
Hemoglobin: 15.3 g/dL (ref 13.0–17.0)
MCH: 33.8 pg (ref 26.0–34.0)
MCHC: 33.6 g/dL (ref 30.0–36.0)
MCV: 100.7 fL — ABNORMAL HIGH (ref 78.0–100.0)
PLATELETS: 180 10*3/uL (ref 150–400)
RBC: 4.53 MIL/uL (ref 4.22–5.81)
RDW: 14.1 % (ref 11.5–15.5)
WBC: 8.5 10*3/uL (ref 4.0–10.5)

## 2016-08-06 LAB — I-STAT ARTERIAL BLOOD GAS, ED
Bicarbonate: 23.3 mmol/L (ref 20.0–28.0)
O2 SAT: 93 %
PCO2 ART: 33.8 mmHg (ref 32.0–48.0)
PO2 ART: 64 mmHg — AB (ref 83.0–108.0)
Patient temperature: 98.6
TCO2: 24 mmol/L (ref 0–100)
pH, Arterial: 7.447 (ref 7.350–7.450)

## 2016-08-06 LAB — CBC WITH DIFFERENTIAL/PLATELET
BASOS ABS: 0 10*3/uL (ref 0.0–0.1)
Basophils Relative: 1 %
Eosinophils Absolute: 0 10*3/uL (ref 0.0–0.7)
Eosinophils Relative: 0 %
HCT: 47.3 % (ref 39.0–52.0)
HEMOGLOBIN: 15.5 g/dL (ref 13.0–17.0)
LYMPHS ABS: 0.9 10*3/uL (ref 0.7–4.0)
LYMPHS PCT: 12 %
MCH: 33 pg (ref 26.0–34.0)
MCHC: 32.8 g/dL (ref 30.0–36.0)
MCV: 100.9 fL — AB (ref 78.0–100.0)
Monocytes Absolute: 1.6 10*3/uL — ABNORMAL HIGH (ref 0.1–1.0)
Monocytes Relative: 19 %
NEUTROS PCT: 68 %
Neutro Abs: 5.5 10*3/uL (ref 1.7–7.7)
Platelets: 191 10*3/uL (ref 150–400)
RBC: 4.69 MIL/uL (ref 4.22–5.81)
RDW: 14.3 % (ref 11.5–15.5)
WBC: 8.1 10*3/uL (ref 4.0–10.5)

## 2016-08-06 LAB — I-STAT CG4 LACTIC ACID, ED
Lactic Acid, Venous: 1.47 mmol/L (ref 0.5–1.9)
Lactic Acid, Venous: 1.05 mmol/L (ref 0.5–1.9)

## 2016-08-06 LAB — CREATININE, SERUM
CREATININE: 1.07 mg/dL (ref 0.61–1.24)
GFR calc Af Amer: >60 mL/min (ref >60)
GFR calc non Af Amer: >60 mL/min (ref >60)

## 2016-08-06 LAB — BRAIN NATRIURETIC PEPTIDE: B NATRIURETIC PEPTIDE 5: 54.7 pg/mL (ref 0.0–100.0)

## 2016-08-06 MED ORDER — ALBUTEROL SULFATE (2.5 MG/3ML) 0.083% IN NEBU
2.5000 mg | INHALATION_SOLUTION | Freq: Four times a day (QID) | RESPIRATORY_TRACT | Status: DC | PRN
  Administered 2016-08-06 – 2016-08-07 (×2): 2.5 mg via RESPIRATORY_TRACT
  Filled 2016-08-06 (×2): qty 3

## 2016-08-06 MED ORDER — ADULT MULTIVITAMIN W/MINERALS CH
1.0000 | ORAL_TABLET | Freq: Every day | ORAL | Status: DC
  Administered 2016-08-06 – 2016-08-09 (×4): 1 via ORAL
  Filled 2016-08-06 (×4): qty 1

## 2016-08-06 MED ORDER — DEXTROSE 5 % IV SOLN
1.0000 g | INTRAVENOUS | Status: DC
  Administered 2016-08-07 – 2016-08-08 (×2): 1 g via INTRAVENOUS
  Filled 2016-08-06 (×3): qty 10

## 2016-08-06 MED ORDER — DEXTROSE 5 % IV SOLN
500.0000 mg | INTRAVENOUS | Status: DC
  Administered 2016-08-07 – 2016-08-08 (×2): 500 mg via INTRAVENOUS
  Filled 2016-08-06 (×3): qty 500

## 2016-08-06 MED ORDER — METHYLPREDNISOLONE SODIUM SUCC 125 MG IJ SOLR
60.0000 mg | Freq: Four times a day (QID) | INTRAMUSCULAR | Status: DC
  Administered 2016-08-06 – 2016-08-08 (×8): 60 mg via INTRAVENOUS
  Filled 2016-08-06 (×8): qty 2

## 2016-08-06 MED ORDER — AZELASTINE HCL 0.1 % NA SOLN
2.0000 | Freq: Two times a day (BID) | NASAL | Status: DC
  Administered 2016-08-06 – 2016-08-09 (×6): 2 via NASAL
  Filled 2016-08-06: qty 30

## 2016-08-06 MED ORDER — MONTELUKAST SODIUM 10 MG PO TABS
10.0000 mg | ORAL_TABLET | Freq: Every day | ORAL | Status: DC
  Administered 2016-08-06 – 2016-08-08 (×3): 10 mg via ORAL
  Filled 2016-08-06 (×3): qty 1

## 2016-08-06 MED ORDER — CALCIUM CARBONATE-VITAMIN D 500-200 MG-UNIT PO TABS
1.0000 | ORAL_TABLET | Freq: Every day | ORAL | Status: DC
  Administered 2016-08-06 – 2016-08-09 (×4): 1 via ORAL
  Filled 2016-08-06 (×4): qty 1

## 2016-08-06 MED ORDER — DEXTROSE 5 % IV SOLN
500.0000 mg | Freq: Once | INTRAVENOUS | Status: AC
  Administered 2016-08-06: 500 mg via INTRAVENOUS
  Filled 2016-08-06: qty 500

## 2016-08-06 MED ORDER — SODIUM CHLORIDE 0.9 % IV SOLN
INTRAVENOUS | Status: DC
  Administered 2016-08-06 – 2016-08-08 (×3): via INTRAVENOUS

## 2016-08-06 MED ORDER — ENSURE ENLIVE PO LIQD: 237.0000 mL | Freq: Two times a day (BID) | ORAL | Status: DC

## 2016-08-06 MED ORDER — ASPIRIN EC 81 MG PO TBEC
81.0000 mg | DELAYED_RELEASE_TABLET | Freq: Every day | ORAL | Status: DC
  Administered 2016-08-06 – 2016-08-09 (×4): 81 mg via ORAL
  Filled 2016-08-06 (×4): qty 1

## 2016-08-06 MED ORDER — SODIUM CHLORIDE 0.9% FLUSH
3.0000 mL | Freq: Two times a day (BID) | INTRAVENOUS | Status: DC
  Administered 2016-08-06 – 2016-08-09 (×5): 3 mL via INTRAVENOUS

## 2016-08-06 MED ORDER — TIOTROPIUM BROMIDE MONOHYDRATE 18 MCG IN CAPS
18.0000 ug | ORAL_CAPSULE | Freq: Every day | RESPIRATORY_TRACT | Status: DC
  Administered 2016-08-07 – 2016-08-09 (×3): 18 ug via RESPIRATORY_TRACT
  Filled 2016-08-06: qty 5

## 2016-08-06 MED ORDER — MOMETASONE FURO-FORMOTEROL FUM 200-5 MCG/ACT IN AERO
2.0000 | INHALATION_SPRAY | Freq: Two times a day (BID) | RESPIRATORY_TRACT | Status: DC
  Administered 2016-08-07 – 2016-08-09 (×5): 2 via RESPIRATORY_TRACT
  Filled 2016-08-06: qty 8.8

## 2016-08-06 MED ORDER — POLYETHYLENE GLYCOL 3350 17 G PO PACK
17.0000 g | PACK | Freq: Every day | ORAL | Status: DC | PRN
  Administered 2016-08-08: 17 g via ORAL
  Filled 2016-08-06: qty 1

## 2016-08-06 MED ORDER — ENOXAPARIN SODIUM 30 MG/0.3ML ~~LOC~~ SOLN
30.0000 mg | SUBCUTANEOUS | Status: DC
Start: 2016-08-06 — End: 2016-08-08
  Administered 2016-08-06 – 2016-08-07 (×2): 30 mg via SUBCUTANEOUS
  Filled 2016-08-06 (×2): qty 0.3

## 2016-08-06 MED ORDER — SODIUM CHLORIDE 0.9 % IV SOLN: 250.0000 mL | INTRAVENOUS | Status: DC | PRN

## 2016-08-06 MED ORDER — PANTOPRAZOLE SODIUM 40 MG PO TBEC
40.0000 mg | DELAYED_RELEASE_TABLET | Freq: Every day | ORAL | Status: DC
  Administered 2016-08-06 – 2016-08-09 (×4): 40 mg via ORAL
  Filled 2016-08-06 (×4): qty 1

## 2016-08-06 MED ORDER — METHYLPREDNISOLONE ACETATE 80 MG/ML IJ SUSP
80.0000 mg | Freq: Once | INTRAMUSCULAR | Status: AC
  Administered 2016-08-06: 80 mg via INTRAMUSCULAR

## 2016-08-06 MED ORDER — SACUBITRIL-VALSARTAN 49-51 MG PO TABS
1.0000 | ORAL_TABLET | Freq: Two times a day (BID) | ORAL | Status: DC
  Administered 2016-08-06 – 2016-08-09 (×6): 1 via ORAL
  Filled 2016-08-06 (×6): qty 1

## 2016-08-06 MED ORDER — CALCIUM CARBONATE-VITAMIN D 600-400 MG-UNIT PO TABS: 1.0000 | ORAL_TABLET | Freq: Every day | ORAL | Status: DC

## 2016-08-06 MED ORDER — SODIUM CHLORIDE 0.9 % IV BOLUS (SEPSIS)
250.0000 mL | Freq: Once | INTRAVENOUS | Status: AC
  Administered 2016-08-06: 250 mL via INTRAVENOUS

## 2016-08-06 MED ORDER — DM-GUAIFENESIN ER 30-600 MG PO TB12
1.0000 | ORAL_TABLET | Freq: Two times a day (BID) | ORAL | Status: DC
  Administered 2016-08-06 – 2016-08-09 (×6): 1 via ORAL
  Filled 2016-08-06 (×6): qty 1

## 2016-08-06 MED ORDER — AMIODARONE HCL 100 MG PO TABS
100.0000 mg | ORAL_TABLET | Freq: Every day | ORAL | Status: DC
  Administered 2016-08-06 – 2016-08-09 (×4): 100 mg via ORAL
  Filled 2016-08-06 (×4): qty 1

## 2016-08-06 MED ORDER — DEXTROSE 5 % IV SOLN
1.0000 g | Freq: Once | INTRAVENOUS | Status: AC
  Administered 2016-08-06: 1 g via INTRAVENOUS
  Filled 2016-08-06: qty 10

## 2016-08-06 MED ORDER — SPIRONOLACTONE 25 MG PO TABS
12.5000 mg | ORAL_TABLET | Freq: Every day | ORAL | Status: DC
  Administered 2016-08-06 – 2016-08-09 (×4): 12.5 mg via ORAL
  Filled 2016-08-06 (×4): qty 1

## 2016-08-06 MED ORDER — SODIUM CHLORIDE 0.9% FLUSH: 3.0000 mL | INTRAVENOUS | Status: DC | PRN

## 2016-08-06 NOTE — H&P (Signed)
History and Physical    Bruce Travis:[id number] DOB: Feb 26, 1935 DOA: 08/06/2016  Referring MD/NP/PA: Dr. Rogene Houston PCP: Odette Fraction, MD  Outpatient Specialists: Dr. Jeffie Pollock Patient coming from: Home  Chief Complaint: Shortness of breath  HPI: Bruce Travis is a 81 y.o. male with medical history significant of coronary artery disease s/p MI, chronic systolic congestive heart failure, chronic kidney disease stage III, home O2 dependent COPD, hypertension and hyperlipidemia presents to the emergency department for evaluation of shortness of breath. His usual state of health which is functionally independent until 4 days ago when he began experiencing cough productive of greenish to yellow sputum associated with progressively worsening shortness of breath, generalized fatigue, decreased appetite. He saw his primary care provider who diagnosed him with viral syndrome and advised him to take prednisone 10 mg daily. His symptoms required an increase in his supplemental O2 to 3 L continuous, were refractory to rescue inhaler and worsened over the course of the week. He presented to his primary care provider this morning for reevaluation, was found to have O2 sats of 88% on 3 L and was referred to the emergency department.   ED Course: In the emergency department he was found to have a left lower lobe pneumonia and received IV Rocephin and IV azithromycin. He is currently satting in the low 90s on 5 L of O2 via nasal cannula.   Review of Systems:  CONSTITUTIONAL: No fever/chills, weight gain/loss, headache. Positive fatigue, weakness, decreased appetite and generalized malaise. EYES: No blurry or double vision. ENT: No tinnitus, postnasal drip, redness or soreness of the oropharynx. RESPIRATORY: Positive cough, dyspnea, negative wheeze, hemoptysis.  CARDIOVASCULAR: No chest pain, palpitations, syncope, orthopnea,  GASTROINTESTINAL: No nausea, vomiting, constipation, diarrhea, abdominal pain,  hematemesis, melena or hematochezia. GENITOURINARY: No dysuria, frequency, hematuria. ENDOCRINE: No polyuria or nocturia. No heat or cold intolerance. HEMATOLOGY: No anemia, bruising, bleeding. INTEGUMENTARY: No rashes, ulcers, lesions. MUSCULOSKELETAL: No arthritis, gout, dyspnea.  NEUROLOGIC: No numbness, tingling, ataxia, seizure-type activity, weakness. PSYCHIATRIC: No anxiety, depression, insomnia.    Past Medical History:  Diagnosis Date  . Allergic rhinitis   . Bronchiectasis   . CAD (coronary artery disease)   . Celiac disease   . Chronic heart failure ([place])   . Chronic kidney disease (CKD), stage III (moderate)   . COPD (chronic obstructive pulmonary disease) ([place])   . Diverticulosis of colon (without mention of hemorrhage)   . Esophageal reflux   . Family history of adverse reaction to anesthesia    daughter gets PONV  . Family hx of colon cancer   . History of stomach ulcers 1980s  . Hyperlipemia   . Hypertension   . 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 Myocardial infarction    "previous MI/echo in 12/2015"  . Nephrolithiasis    "got them now; never had OR/scopes" (02/03/2016)  . On home oxygen therapy    "2L; 24/7" (02/03/2016)  . Other diseases of lung, not elsewhere classified   . Personal history of colonic polyps 04/26/2007   hyperplastic   . Pneumonia "several times"    Past Surgical History:  Procedure Laterality Date  . CARDIAC CATHETERIZATION  02/03/2016  . CARDIAC CATHETERIZATION  09/30/2009   non-obstructive CAD w/30% narrowing in prox LAD (Dr. Waunita Schooner)  . CARDIAC CATHETERIZATION  05/04/2001   same at 2011 cath (Dr. Waunita Schooner)  . CARDIAC CATHETERIZATION N/A 02/03/2016   Procedure: Right/Left Heart Cath and Coronary Angiography;  Surgeon: Chrissie Noa  Wells Guiles, MD;  Location: [facility];  Service: Cardiovascular;  Laterality: N/A;  . EPIGLOTOPLASTY W/ MLB     removal due to carcinoma  . EXCISIONAL HEMORRHOIDECTOMY   2000s  . HAND SURGERY Right 1958   d/t crush injury  . TONSILLECTOMY AND ADENOIDECTOMY    . ULTRASOUND GUIDANCE FOR VASCULAR ACCESS  02/03/2016   Procedure: Ultrasound Guidance For Vascular Access;  Surgeon: Pixie Casino, MD;  Location: [facility];  Service: Cardiovascular;;  . VASECTOMY       reports that he quit smoking about 27 years ago. His smoking use included Cigarettes. He has a 70.00 pack-year smoking history. He quit smokeless tobacco use about 14 years ago. His smokeless tobacco use included Chew. He reports that he does not drink alcohol or use drugs.  Allergies  Allergen Reactions  . Adhesive [Tape] Other (See Comments)    Band-aids - tears skin off  . Codeine Other (See Comments)    Thought head was going to blow off  . Gluten Meal Other (See Comments)    Celiac disease    Family History  Problem Relation Age of Onset  . Heart disease Father   . Colon cancer Father   . Prostate cancer Father   . Stroke Mother   . Endometrial cancer Sister   . Stroke Maternal Grandmother   . Cancer Paternal Grandmother    Family history reviewed and confirmed with the patient.  Prior to Admission medications   Medication Sig Start Date End Date Taking? Authorizing Provider  albuterol (PROAIR HFA) 108 (90 Base) MCG/ACT inhaler INHALE 2 PUFFS EVERY 4 HOURS AS NEEDED FOR SHORTNESS OF BREATH. 06/22/16  Yes Susy Frizzle, MD  AMBULATORY NON FORMULARY MEDICATION O2 2 Lpm continuous   Yes Historical Provider, MD  amiodarone (PACERONE) 200 MG tablet Take 0.5 tablets (100 mg total) by mouth daily. 06/30/16  Yes Jolaine Artist, MD  aspirin EC 81 MG tablet Take 81 mg by mouth daily.   Yes Historical Provider, MD  azelastine (ASTELIN) 0.1 % nasal spray PLACE 2 SPRAYS INTO EACH NOSTRILS TWICE DAILY AS DIRECTED. 07/01/16  Yes Susy Frizzle, MD  Calcium Carbonate-Vitamin D (CALTRATE 600+D) 600-400 MG-UNIT tablet Take 1 tablet by mouth daily.   Yes Historical Provider, MD    montelukast (SINGULAIR) 10 MG tablet TAKE 1 TABLET BY MOUTH DAILY. 04/19/16  Yes Susy Frizzle, MD  Multiple Vitamin (MULTIVITAMIN WITH MINERALS) TABS tablet Take 1 tablet by mouth daily.   Yes Historical Provider, MD  pantoprazole (PROTONIX) 40 MG tablet TAKE ONE TABLET BY MOUTH DAILY. 07/20/16  Yes Susy Frizzle, MD  polyethylene glycol [facility] / GLYCOLAX) packet Take 17 g by mouth daily as needed for mild constipation.    Yes Historical Provider, MD  predniSONE (DELTASONE) 20 MG tablet Take 10 mg by mouth daily. 06/22/16  Yes Historical Provider, MD  sacubitril-valsartan (ENTRESTO) 49-51 MG Take 1 tablet by mouth 2 (two) times daily. 04/02/16  Yes Jolaine Artist, MD  SPIRIVA HANDIHALER 18 MCG inhalation capsule INHALE 1 CAPSULE ONCE DAILY AS DIRECTED 03/16/16  Yes Susy Frizzle, MD  spironolactone (ALDACTONE) 25 MG tablet Take 0.5 tablets (12.5 mg total) by mouth daily. 02/06/16  Yes Shirley Friar, PA-C  SYMBICORT 160-4.5 MCG/ACT inhaler INHALE 2 PUFFS FIRST THING IN MORNING AND THEN ANOTHER 2 PUFFS ABOUT 12 HOURS LATER. 08/14/15  Yes Susy Frizzle, MD    Physical Exam: Vitals:   08/06/16 1200  08/06/16 1215 08/06/16 1230 08/06/16 1245  BP: 126/72 135/73 108/95 127/77  Pulse: 84 85 87 80  Resp: 21 16 19 24   Temp:      TempSrc:      SpO2: 92% 91% 94% 98%  Weight:      Height:        Constitutional: NAD, calm, comfortable Vitals:   08/06/16 1200 08/06/16 1215 08/06/16 1230 08/06/16 1245  BP: 126/72 135/73 108/95 127/77  Pulse: 84 85 87 80  Resp: 21 16 19 24   Temp:      TempSrc:      SpO2: 92% 91% 94% 98%  Weight:      Height:       PHYSICAL EXAMINATION: VITAL SIGNS: Blood pressure 119/78, pulse 80, temperature 98.1 F (36.7 C), temperature source Oral, resp. rate 20, height 5' 6"  (1.676 m), weight 56.7 kg (125 lb), SpO2 93 %.  GENERAL: 81 y.o.-year-old white male patient, well-developed, well-nourished lying in the bed in no acute distress.  Pleasant  and cooperative.   HEENT: Head atraumatic, normocephalic. Pupils equal, round, reactive to light and accommodation. No scleral icterus. Extraocular muscles intact. Nares are patent. Oropharynx is clear. Mucus membranes moist. NECK: Supple, full range of motion. No JVD, no bruit heard. No thyroid enlargement, no tenderness, no cervical lymphadenopathy. CHEST: Normal breath sounds bilaterally. No wheezing, rales, rhonchi or crackles. No use of accessory muscles of respiration.  No reproducible chest wall tenderness.  CARDIOVASCULAR: S1, S2 normal. No murmurs, rubs, or gallops. Cap refill <2 seconds. ABDOMEN: Soft, nontender, nondistended. No rebound, guarding, rigidity. Normoactive bowel sounds present in all four quadrants. No organomegaly or mass. EXTREMITIES: Full range of motion. No pedal edema, cyanosis, or clubbing. NEUROLOGIC: Cranial nerves II through XII are grossly intact with no focal sensorimotor deficit. Muscle strength 5/5 in all extremities. Sensation intact. Gait not checked. PSYCHIATRIC: The patient is alert and oriented x 3. Normal affect, mood, thought content. SKIN: Warm, dry, and intact without obvious rash, lesion, or ulcer.    Labs on Admission: I have personally reviewed following labs and imaging studies  CBC:  Recent Labs Lab 08/06/16 1006  WBC 8.1  NEUTROABS 5.5  HGB 15.5  HCT 47.3  MCV 100.9*  PLT 007   Basic Metabolic Panel:  Recent Labs Lab 08/06/16 1006  NA 136  K 4.5  CL 102  CO2 24  GLUCOSE 101*  BUN 19  CREATININE 1.26*  CALCIUM 9.3   GFR: Estimated Creatinine Clearance: 38.1 mL/min (by C-G formula based on SCr of 1.26 mg/dL (H)). Liver Function Tests:  Recent Labs Lab 08/06/16 1006  AST 34  ALT 38  ALKPHOS 57  BILITOT 1.0  PROT 6.2*  ALBUMIN 3.4*   No results for input(s): LIPASE, AMYLASE in the last 168 hours. No results for input(s): AMMONIA in the last 168 hours. Coagulation Profile: No results for input(s): INR, PROTIME in  the last 168 hours. Cardiac Enzymes: No results for input(s): CKTOTAL, CKMB, CKMBINDEX, TROPONINI in the last 168 hours. BNP (last 3 results) No results for input(s): PROBNP in the last 8760 hours. HbA1C: No results for input(s): HGBA1C in the last 72 hours. CBG: No results for input(s): GLUCAP in the last 168 hours. Lipid Profile: No results for input(s): CHOL, HDL, LDLCALC, TRIG, CHOLHDL, LDLDIRECT in the last 72 hours. Thyroid Function Tests: No results for input(s): TSH, T4TOTAL, FREET4, T3FREE, THYROIDAB in the last 72 hours. Anemia Panel: No results for input(s): VITAMINB12, FOLATE, FERRITIN, TIBC, IRON, RETICCTPCT in  the last 72 hours. Urine analysis:    Component Value Date/Time   COLORURINE YELLOW 01/10/2012 1730   APPEARANCEUR CLEAR 01/10/2012 1730   LABSPEC 1.016 01/10/2012 1730   PHURINE 5.5 01/10/2012 1730   GLUCOSEU NEGATIVE 01/10/2012 1730   GLUCOSEU NEGATIVE 09/01/2006 1033   HGBUR TRACE (A) 01/10/2012 1730   BILIRUBINUR NEGATIVE 01/10/2012 1730   KETONESUR NEGATIVE 01/10/2012 1730   PROTEINUR NEGATIVE 01/10/2012 1730   UROBILINOGEN 0.2 01/10/2012 1730   NITRITE NEGATIVE 01/10/2012 1730   LEUKOCYTESUR NEGATIVE 01/10/2012 1730   Sepsis Labs: @LABRCNTIP (procalcitonin:4,lacticidven:4) )No results found for this or any previous visit (from the past 240 hour(s)).   Radiological Exams on Admission: Dg Chest 2 View  Result Date: 08/06/2016 CLINICAL DATA:  Worsening shortness of breath over the past 2-3 days. History of COPD. EXAM: CHEST  2 VIEW COMPARISON:  PA and lateral chest 01/28/2016 and 06/30/2015. FINDINGS: Extensive bullous emphysematous change and distortion of the pulmonary architecture are again seen. The chest is hyperexpanded. Right lower lobe calcified granuloma is unchanged. There is new airspace disease in the left lower lobe. No pneumothorax or pleural fluid. Heart size normal. Aortic atherosclerosis noted. IMPRESSION: Left lower lobe airspace disease  most consistent with pneumonia. Recommend followup to clearing. Emphysema. Electronically Signed   By: Inge Rise M.D.   On: 08/06/2016 10:30    Assessment/Plan Active Problems:   Community acquired pneumonia of left lower lobe of lung ([place])   This is a 81 year old male with a past medical history significant for coronary artery disease s/p MI, chronic systolic congestive heart failure, chronic kidney disease stage III, home O2 dependent COPD, hypertension and hyperlipidemia, now being admitted with:  1. Community-acquired pneumonia on the left lower lobe -Admit to inpatient with telemetry monitoring  -IV Rocephin and IV azithromycin -Discontinue prednisone and start IV steroids given history of COPD -Continuous pulse oximetry given history of COPD -Albuterol nebulizers, expectorants and O2 therapy as needed -Follow up sputum cultures  -Consider pulmonary consult given history of COPD if patient is not improving  2. History of chronic systolic congestive heart failure -Currently no evidence of acute exacerbation of congestive heart failure (no edema, no xray evidence of congestion, normal BNP).  -Continue spironolactone and amiodarone -Hep-Lock for now  3. History of home O2 dependent COPD -Continue Spiriva and Symbicort  4. History of chronic kidney disease, stage III -Creatinine is at or better than baseline. Monitor and repeat BMP in a.m.  5. History of hypertension -Continue Entresto  6. History of coronary artery disease -Continue aspirin  7. History of GERD -Continue Protonix   8. History of constipation -Continue MiraLAX as needed   9. History of allergic rhinitis -Continue Astelin and Singulair   Fluids: Hep-Lock Diet/Nutrition: Heart healthy DVT prophylaxis: Lovenox (renally dosed), SCDs and early ambulation  Code Status:  Patient expresses his wishes to be DO NOT RESUSCITATE/DO NOT INTUBATE. He has a living will at home which his wife will bring to the  hospital. Family Communication:-Consider pulmonary consult given history of COPD if patient is not improving Disposition Plan: 2-3 days to home Consults called: None  Admission status:  Inpatient telemetry   [facility] Pager [phone]  If 7PM-7AM, please contact night-coverage [email or web address] Password [email or web address]  08/06/2016, 1:03 PM

## 2016-08-06 NOTE — Addendum Note (Signed)
Addended by: Shary Decamp B on: 08/06/2016 09:27 AM   Modules accepted: Orders

## 2016-08-06 NOTE — ED Notes (Signed)
Pt O2 increased to 5L [place] rt SOB and O2 sat in 80s.

## 2016-08-06 NOTE — ED Notes (Signed)
Pt states he is on a gluten free diet.

## 2016-08-06 NOTE — Progress Notes (Signed)
Family Meeting Note  Advance Directive:yes  Today a meeting took place with the Patient.  The following clinical team members were present during this meeting:MD  The following were discussed:Patient's diagnosis: , Patient's progosis: Unable to determine and Goals for treatment: DNR  Additional follow-up to be provided: Patient's wife to bring Living Will from home.   Time spent during discussion:15 minutes  [employer], DO

## 2016-08-06 NOTE — ED Provider Notes (Addendum)
[facility] DEPT Provider Note   CSN: [id number] Arrival date & time: 08/06/16  0941     History   Chief Complaint Chief Complaint  Patient presents with  . Shortness of Breath    HPI Bruce Travis is a 81 y.o. male.  Patient with onset of an upper respiratory infection on Tuesday saw primary care provider started on prednisone. Patient known to have a history of COPD and CHF. Patient's had a productive cough. Patient normally uses 2 L of oxygen early this week had increase it to 3. Now feeling very short of breath. Patient was at the primary care office today and sent in here for further evaluation oxygen saturations were 88% on 3 L. Patient denies any fevers any chest pain. Just feeling short of breath. Still feels short of breath and currently is on 5 L of nasal cannula oxygen.      Past Medical History:  Diagnosis Date  . Allergic rhinitis   . Bronchiectasis   . CAD (coronary artery disease)   . Celiac disease   . Chronic heart failure ([place])   . Chronic kidney disease (CKD), stage III (moderate)   . COPD (chronic obstructive pulmonary disease) ([place])   . Diverticulosis of colon (without mention of hemorrhage)   . Esophageal reflux   . Family history of adverse reaction to anesthesia    daughter gets PONV  . Family hx of colon cancer   . History of stomach ulcers 1980s  . Hyperlipemia   . Hypertension   . 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 Myocardial infarction    "previous MI/echo in 12/2015"  . Nephrolithiasis    "got them now; never had OR/scopes" (02/03/2016)  . On home oxygen therapy    "2L; 24/7" (02/03/2016)  . Other diseases of lung, not elsewhere classified   . Personal history of colonic polyps 04/26/2007   hyperplastic   . Pneumonia "several times"    Patient Active Problem List   Diagnosis Date Noted  . Heart failure ([place]) 02/03/2016  . Cardiomyopathy, ischemic 01/06/2016  . History of acute inferior wall MI  01/06/2016  . PVC's (premature ventricular contractions) 12/12/2015  . Insomnia 04/16/2015  . Nocturnal hypoxemia 03/06/2013  . COPD mixed type ([place]) 06/22/2012  . Allergic rhinitis 01/10/2012  . Hypertension 01/10/2012  . Dysphagia 04/13/2011  . Hoarseness 04/02/2011  . Celiac disease 12/15/2010  . SINUSITIS, CHRONIC 09/10/2009  . ARTHUS PHENOMENON 10/05/2007  . PULMONARY NODULE 08/11/2007  . Chronic respiratory failure ([place]) 07/06/2007  . GERD 07/06/2007    Past Surgical History:  Procedure Laterality Date  . CARDIAC CATHETERIZATION  02/03/2016  . CARDIAC CATHETERIZATION  09/30/2009   non-obstructive CAD w/30% narrowing in prox LAD (Dr. Waunita Schooner)  . CARDIAC CATHETERIZATION  05/04/2001   same at 2011 cath (Dr. Waunita Schooner)  . CARDIAC CATHETERIZATION N/A 02/03/2016   Procedure: Right/Left Heart Cath and Coronary Angiography;  Surgeon: Pixie Casino, MD;  Location: [facility];  Service: Cardiovascular;  Laterality: N/A;  . EPIGLOTOPLASTY W/ MLB     removal due to carcinoma  . EXCISIONAL HEMORRHOIDECTOMY  2000s  . HAND SURGERY Right 1958   d/t crush injury  . TONSILLECTOMY AND ADENOIDECTOMY    . ULTRASOUND GUIDANCE FOR VASCULAR ACCESS  02/03/2016   Procedure: Ultrasound Guidance For Vascular Access;  Surgeon: Pixie Casino, MD;  Location: [facility];  Service: Cardiovascular;;  . VASECTOMY  Home Medications    Prior to Admission medications   Medication Sig Start Date End Date Taking? Authorizing Provider  albuterol (PROAIR HFA) 108 (90 Base) MCG/ACT inhaler INHALE 2 PUFFS EVERY 4 HOURS AS NEEDED FOR SHORTNESS OF BREATH. 06/22/16  Yes Susy Frizzle, MD  AMBULATORY NON FORMULARY MEDICATION O2 2 Lpm continuous   Yes Historical Provider, MD  amiodarone (PACERONE) 200 MG tablet Take 0.5 tablets (100 mg total) by mouth daily. 06/30/16  Yes Jolaine Artist, MD  aspirin EC 81 MG tablet Take 81 mg by mouth daily.   Yes Historical Provider, MD    azelastine (ASTELIN) 0.1 % nasal spray PLACE 2 SPRAYS INTO EACH NOSTRILS TWICE DAILY AS DIRECTED. 07/01/16  Yes Susy Frizzle, MD  Calcium Carbonate-Vitamin D (CALTRATE 600+D) 600-400 MG-UNIT tablet Take 1 tablet by mouth daily.   Yes Historical Provider, MD  montelukast (SINGULAIR) 10 MG tablet TAKE 1 TABLET BY MOUTH DAILY. 04/19/16  Yes Susy Frizzle, MD  Multiple Vitamin (MULTIVITAMIN WITH MINERALS) TABS tablet Take 1 tablet by mouth daily.   Yes Historical Provider, MD  pantoprazole (PROTONIX) 40 MG tablet TAKE ONE TABLET BY MOUTH DAILY. 07/20/16  Yes Susy Frizzle, MD  polyethylene glycol [facility] / GLYCOLAX) packet Take 17 g by mouth daily as needed for mild constipation.    Yes Historical Provider, MD  predniSONE (DELTASONE) 20 MG tablet Take 10 mg by mouth daily. 06/22/16  Yes Historical Provider, MD  sacubitril-valsartan (ENTRESTO) 49-51 MG Take 1 tablet by mouth 2 (two) times daily. 04/02/16  Yes Jolaine Artist, MD  SPIRIVA HANDIHALER 18 MCG inhalation capsule INHALE 1 CAPSULE ONCE DAILY AS DIRECTED 03/16/16  Yes Susy Frizzle, MD  spironolactone (ALDACTONE) 25 MG tablet Take 0.5 tablets (12.5 mg total) by mouth daily. 02/06/16  Yes Shirley Friar, PA-C  SYMBICORT 160-4.5 MCG/ACT inhaler INHALE 2 PUFFS FIRST THING IN MORNING AND THEN ANOTHER 2 PUFFS ABOUT 12 HOURS LATER. 08/14/15  Yes Susy Frizzle, MD    Family History Family History  Problem Relation Age of Onset  . Heart disease Father   . Colon cancer Father   . Prostate cancer Father   . Stroke Mother   . Endometrial cancer Sister   . Stroke Maternal Grandmother   . Cancer Paternal Grandmother     Social History Social History  Substance Use Topics  . Smoking status: Former Smoker    Packs/day: 2.00    Years: 35.00    Types: Cigarettes    Quit date: 01/21/1989  . Smokeless tobacco: Former Systems developer    Types: Chew    Quit date: 08/23/2001  . Alcohol use No     Allergies   Adhesive [tape]; Codeine;  and Gluten meal   Review of Systems Review of Systems  Constitutional: Negative for fever.  HENT: Positive for congestion.   Eyes: Negative for visual disturbance.  Respiratory: Positive for cough and shortness of breath.   Cardiovascular: Negative for chest pain and leg swelling.  Gastrointestinal: Negative for abdominal pain.  Genitourinary: Negative for dysuria.  Musculoskeletal: Negative for back pain.  Skin: Negative for rash.  Neurological: Negative for headaches.  Hematological: Does not bruise/bleed easily.  Psychiatric/Behavioral: Negative for confusion.     Physical Exam Updated Vital Signs BP 127/77   Pulse 80   Temp 98.1 F (36.7 C) (Oral)   Resp 24   Ht 5' 6"  (1.676 m)   Wt 56.7 kg   SpO2 98%   BMI 20.18  kg/m   Physical Exam  Constitutional: He appears well-developed and well-nourished. No distress.  HENT:  Head: Normocephalic and atraumatic.  Eyes: Conjunctivae and EOM are normal. Pupils are equal, round, and reactive to light.  Neck: Normal range of motion.  Cardiovascular: Normal rate and regular rhythm.   Pulmonary/Chest: Effort normal. He has no rales.  Abdominal: Soft. Bowel sounds are normal. There is no tenderness.  Musculoskeletal: Normal range of motion.  Neurological: He is alert. No cranial nerve deficit or sensory deficit. He exhibits normal muscle tone.  Skin: Skin is warm.  Nursing note and vitals reviewed.    ED Treatments / Results  Labs (all labs ordered are listed, but only abnormal results are displayed) Labs Reviewed  COMPREHENSIVE METABOLIC PANEL - Abnormal; Notable for the following:       Result Value   Glucose, Bld 101 (*)    Creatinine, Ser 1.26 (*)    Total Protein 6.2 (*)    Albumin 3.4 (*)    GFR calc non Af Amer 52 (*)    All other components within normal limits  CBC WITH DIFFERENTIAL/PLATELET - Abnormal; Notable for the following:    MCV 100.9 (*)    Monocytes Absolute 1.6 (*)    All other components within  normal limits  I-STAT ARTERIAL BLOOD GAS, ED - Abnormal; Notable for the following:    pO2, Arterial 64.0 (*)    All other components within normal limits  BRAIN NATRIURETIC PEPTIDE  I-STAT CG4 LACTIC ACID, ED  I-STAT CG4 LACTIC ACID, ED    EKG  EKG Interpretation  Date/Time:  Friday August 06 2016 09:59:15 EST Ventricular Rate:  90 PR Interval:    QRS Duration: 157 QT Interval:  400 QTC Calculation: 490 R Axis:   -87 Text Interpretation:  Sinus tachycardia Paired ventricular premature complexes Right bundle branch block Artifact Confirmed by Rogene Houston  MD, Nicki Reaper ([id number]) on 08/06/2016 10:05:15 AM Also confirmed by Rogene Houston  MD, Kelbie Moro [phone]), editor [place] CT, Leda Gauze [phone])  on 08/06/2016 10:15:03 AM       Radiology Dg Chest 2 View  Result Date: 08/06/2016 CLINICAL DATA:  Worsening shortness of breath over the past 2-3 days. History of COPD. EXAM: CHEST  2 VIEW COMPARISON:  PA and lateral chest 01/28/2016 and 06/30/2015. FINDINGS: Extensive bullous emphysematous change and distortion of the pulmonary architecture are again seen. The chest is hyperexpanded. Right lower lobe calcified granuloma is unchanged. There is new airspace disease in the left lower lobe. No pneumothorax or pleural fluid. Heart size normal. Aortic atherosclerosis noted. IMPRESSION: Left lower lobe airspace disease most consistent with pneumonia. Recommend followup to clearing. Emphysema. Electronically Signed   By: Inge Rise M.D.   On: 08/06/2016 10:30    Procedures Procedures (including critical care time)  Medications Ordered in ED Medications  0.9 %  sodium chloride infusion ( Intravenous New Bag/Given 08/06/16 1249)  azithromycin (ZITHROMAX) 500 mg in dextrose 5 % 250 mL IVPB (500 mg Intravenous New Bag/Given 08/06/16 1249)  sodium chloride 0.9 % bolus 250 mL (0 mLs Intravenous Stopped 08/06/16 1249)  cefTRIAXone (ROCEPHIN) 1 g in dextrose 5 % 50 mL IVPB (0 g Intravenous Stopped 08/06/16  1230)     Initial Impression / Assessment and Plan / ED Course  I have reviewed the triage vital signs and the nursing notes.  Pertinent labs & imaging results that were available during my care of the patient were reviewed by me and considered in my medical decision making (see chart  for details).  Clinical Course     Chest x-ray consistent with left lower lobe pneumonia. Patient has not had recent hospitalizations this would be a community-acquired pneumonia. Patient started on Rocephin and Zithromax. Your blood gas shows a low PO2 despite being on 5 L of oxygen. PH and PCO2 are normal. Chest rate is no evidence of congestive heart failure at this time. Discussed with hospitalist they will admit.  Final Clinical Impressions(s) / ED Diagnoses   Final diagnoses:  Community acquired pneumonia of left lower lobe of lung [facility])    New Prescriptions New Prescriptions   No medications on file     Fredia Sorrow, MD 08/06/16 [place], MD 09/08/16 [place], MD 09/08/16 1227

## 2016-08-06 NOTE — Progress Notes (Signed)
Subjective:    Patient ID: Bruce Travis, male    DOB: 1935/05/23, 81 y.o.   MRN: [id number]  HPI  08/03/16 Has a well documented history of severe emphysema with exacerbations triggered by URI.  The patient's wife had a cold last week.  He has now developed congestion, dry cough, post nasal drip and a sore throat.  No fever, chills, hemoptysis, or sputum production.  Slightly more SOB from baseline.  Exam is unremarkable.  At that time, my plan was: URI- symtoms consistent with viral URI.  REcommended tincture of time with low threshold to add levaquin if increased sputum production, change in sputum character, worsening SOB.  Also start prednisone 10 mg poqday given his track record for 5 days until URI hopefully subsides.    08/06/16 Patient states that his symptoms suddenly worsened yesterday. He's been using albuterol/Pro Air every 3-4 hours with minimal relief. Today on examination, he is demonstrating increased work of breathing with tachypnea. He has diminished breath sounds bilaterally. Pulse oximetry is 83% on 3 L of oxygen. Normally the patient is on 2 L of oxygen. He is not in respiratory distress. He is deathly demonstrating increased work of breathing. On exam, he has expiratory wheezing in markedly diminished breath sounds. He appears to be having a COPD exacerbation. At 8:56 in the morning, he was given 80 mg of Depo-Medrol. Also gave him 2.5 mg of albuterol and 0.5 mg of Atrovent via nebulizer treatment. I will reassess the patient in 10-15 minutes to see if there is any improvement.   Past Medical History:  Diagnosis Date  . Allergic rhinitis   . Bronchiectasis   . CAD (coronary artery disease)   . Celiac disease   . Chronic heart failure ([place])   . Chronic kidney disease (CKD), stage III (moderate)   . COPD (chronic obstructive pulmonary disease) ([place])   . Diverticulosis of colon (without mention of hemorrhage)   . Esophageal reflux   . Family history of adverse reaction to  anesthesia    daughter gets PONV  . Family hx of colon cancer   . History of stomach ulcers 1980s  . Hyperlipemia   . Hypertension   . 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 Myocardial infarction    "previous MI/echo in 12/2015"  . Nephrolithiasis    "got them now; never had OR/scopes" (02/03/2016)  . On home oxygen therapy    "2L; 24/7" (02/03/2016)  . Other diseases of lung, not elsewhere classified   . Personal history of colonic polyps 04/26/2007   hyperplastic   . Pneumonia "several times"   Past Surgical History:  Procedure Laterality Date  . CARDIAC CATHETERIZATION  02/03/2016  . CARDIAC CATHETERIZATION  09/30/2009   non-obstructive CAD w/30% narrowing in prox LAD (Dr. Waunita Schooner)  . CARDIAC CATHETERIZATION  05/04/2001   same at 2011 cath (Dr. Waunita Schooner)  . CARDIAC CATHETERIZATION N/A 02/03/2016   Procedure: Right/Left Heart Cath and Coronary Angiography;  Surgeon: Pixie Casino, MD;  Location: [facility];  Service: Cardiovascular;  Laterality: N/A;  . EPIGLOTOPLASTY W/ MLB     removal due to carcinoma  . EXCISIONAL HEMORRHOIDECTOMY  2000s  . HAND SURGERY Right 1958   d/t crush injury  . TONSILLECTOMY AND ADENOIDECTOMY    . ULTRASOUND GUIDANCE FOR VASCULAR ACCESS  02/03/2016   Procedure: Ultrasound Guidance For Vascular Access;  Surgeon: Pixie Casino, MD;  Location: [facility];  Service: Cardiovascular;;  . VASECTOMY     Current Outpatient Prescriptions on File Prior to Visit  Medication Sig Dispense Refill  . albuterol (PROAIR HFA) 108 (90 Base) MCG/ACT inhaler INHALE 2 PUFFS EVERY 4 HOURS AS NEEDED FOR SHORTNESS OF BREATH. 8.5 g 5  . AMBULATORY NON FORMULARY MEDICATION O2 2 Lpm continuous    . amiodarone (PACERONE) 200 MG tablet Take 0.5 tablets (100 mg total) by mouth daily. 30 tablet 6  . aspirin EC 81 MG tablet Take 81 mg by mouth daily.    Marland Kitchen azelastine (ASTELIN) 0.1 % nasal spray PLACE 2 SPRAYS INTO EACH NOSTRILS TWICE  DAILY AS DIRECTED. 30 mL 0  . montelukast (SINGULAIR) 10 MG tablet TAKE 1 TABLET BY MOUTH DAILY. 90 tablet 0  . Multiple Vitamin (MULTIVITAMIN WITH MINERALS) TABS tablet Take 1 tablet by mouth daily.    . pantoprazole (PROTONIX) 40 MG tablet TAKE ONE TABLET BY MOUTH DAILY. 30 tablet 0  . polyethylene glycol (MIRALAX / GLYCOLAX) packet Take 17 g by mouth daily as needed for mild constipation.     . sacubitril-valsartan (ENTRESTO) 49-51 MG Take 1 tablet by mouth 2 (two) times daily. 60 tablet 6  . SPIRIVA HANDIHALER 18 MCG inhalation capsule INHALE 1 CAPSULE ONCE DAILY AS DIRECTED 30 capsule 11  . spironolactone (ALDACTONE) 25 MG tablet Take 0.5 tablets (12.5 mg total) by mouth daily. 16 tablet 6  . SYMBICORT 160-4.5 MCG/ACT inhaler INHALE 2 PUFFS FIRST THING IN MORNING AND THEN ANOTHER 2 PUFFS ABOUT 12 HOURS LATER. 10.2 g 11   No current facility-administered medications on file prior to visit.    Allergies  Allergen Reactions  . Adhesive [Tape] Other (See Comments)    Band-aids - tears skin off  . Codeine Other (See Comments)    Thought head was going to blow off  . Gluten Meal Other (See Comments)    Celiac disease   Social History   Social History  . Marital status: Married    Spouse name: N/A  . Number of children: 3  . Years of education: N/A   Occupational History  . retired Retired    Psychologist, sport and exercise and Administrator   Social History Main Topics  . Smoking status: Former Smoker    Packs/day: 2.00    Years: 35.00    Types: Cigarettes    Quit date: 01/21/1989  . Smokeless tobacco: Former Systems developer    Types: Chew    Quit date: 08/23/2001  . Alcohol use No  . Drug use: No  . Sexual activity: Not on file   Other Topics Concern  . Not on file   Social History Narrative  . No narrative on file      Review of Systems  All other systems reviewed and are negative.      Objective:   Physical Exam  HENT:  Right Ear: Tympanic membrane and ear canal normal.  Left Ear: Tympanic  membrane and ear canal normal.  Nose: No mucosal edema or rhinorrhea.  Mouth/Throat: Posterior oropharyngeal erythema present.  Cardiovascular: Normal rate, regular rhythm and normal heart sounds.   No murmur heard. Pulmonary/Chest: Accessory muscle usage present. Tachypnea noted. He has decreased breath sounds. He has wheezes. He has no rhonchi. He has no rales. He exhibits no tenderness.  Vitals reviewed.   After his first nebulizer treatment, I believe I can appreciate some left basilar crackles possibly consistent with pneumonia. He continues to report shortness of breath and demonstrate increased work of breathing. I  believe he needs ER evaluation.      Assessment & Plan:  URI, acute  COPD exacerbation ([place])  Acute respiratory failure with hypoxia ([facility])  Patient appears to develop an upper respiratory infection which has led to a COPD exacerbation and possibly left lower lobe pneumonia. He is received 80 mg of Depo-Medrol and 1 DuoNeb in the office. He continues to report shortness of breath and continues to report increased work of breathing. Therefore I believe he needs ER evaluation and hospital admission for management Particullary given his history of cardiovascular disease.  Patient is going to the emergency room by personal vehicle. Emergency room was notified.

## 2016-08-06 NOTE — ED Triage Notes (Signed)
Pt sent from PCP office for evaluation of increased SOB x 2-3 days. Pt. States hx of COPD/CHF, started on prednisone Tuesday for SOB with no improvement. Pt. Wears 2-3L at home. Pt. O2 sats 88% on 3L in triage. Pt. Denies chest pain. AxO x4.

## 2016-08-06 NOTE — ED Notes (Signed)
Pt's O2 saturation fluctuating between 88 and 90 on 3L via [place]; Increased to 5L per Elmyra Ricks, RN

## 2016-08-07 NOTE — Progress Notes (Signed)
Nutrition Brief Note  Patient identified on the Malnutrition Screening Tool (MST) Report  Wt Readings from Last 15 Encounters:  08/07/16 125 lb 3.2 oz (56.8 kg)  08/03/16 132 lb (59.9 kg)  07/20/16 133 lb (60.3 kg)  07/01/16 128 lb 12 oz (58.4 kg)  06/30/16 131 lb 1.9 oz (59.5 kg)  06/29/16 128 lb 12 oz (58.4 kg)  06/22/16 128 lb 8.5 oz (58.3 kg)  06/22/16 131 lb (59.4 kg)  06/15/16 128 lb 4.9 oz (58.2 kg)  06/10/16 128 lb 12 oz (58.4 kg)  06/08/16 129 lb 6.6 oz (58.7 kg)  06/01/16 127 lb 13.9 oz (58 kg)  05/27/16 128 lb 12 oz (58.4 kg)  05/25/16 129 lb 13.6 oz (58.9 kg)  05/18/16 125 lb 14.1 oz (57.1 kg)    Body mass index is 20.21 kg/m. Patient meets criteria for Health wt for ht based on current BMI.   Current diet order is Heart Healthy/Gluten free, patient is consuming approximately 85% of meals at this time.   Pt reports that this past week when he was acutely ill with pna, he had to force himself to eat. He reports now he is feeling better and his poor appetite has resolved.   In regards to his weight loss, he reports this was not recent and was from June when he was hospitalized and is not relevant to this acute illness. He give UBW of 124-127 lbs.   His appetite has returned. He didn't believe Ensure was Gluten free.    Denied having any problems at this time. If nutrition issues arise, please consult RD.   Burtis Junes RD, LDN, CNSC Clinical Nutrition Pager: [phone] 08/07/2016 1:07 PM

## 2016-08-07 NOTE — Progress Notes (Signed)
PROGRESS NOTE                                                                                                                                                                                                             Patient Demographics:    Bruce Travis, is a 81 y.o. male, DOB - 1935/06/01, ZOX:[id number]  Admit date - 08/06/2016   Admitting Physician Harvie Bridge, DO  Outpatient Primary MD for the patient is [facility] TOM, MD  LOS - 1    Chief Complaint  Patient presents with  . Shortness of Breath       Brief Narrative  81 y.o. male with medical history significant of coronary artery disease s/p MI, chronic systolic congestive heart failure, chronic kidney disease stage III, home O2 dependent COPD, hypertension and hyperlipidemia presents to the emergency department for evaluation of shortness of breath and cough, workup significant for left lower lobe pneumonia.   Subjective:    Dayron Odland today has, No headache, No chest pain, No abdominal pain - No Nausea, Report dyspnea is improving, reports cough, minimally productive, but he feels congested.   Assessment  & Plan :    Active Problems:   Community acquired pneumonia of left lower lobe of lung ([place])  Community acquired pneumonia - Patient presents with cough, productive sputum, chest x-ray significant for left lower airspace disease, continue with IV Rocephin and azithromycin, continue with Mucinex, nebs as needed, will start on flutter valve. - Low sputum cultures and blood cultures  History of chronic systolic congestive heart failure - no evidence of acute exacerbation of congestive heart failure -Continue spironolactone and amiodarone - Systolic heart failure in the past thought to be secondary to multiple PVCs, symmetric and improved after started on amiodarone per cardiology note, will continue the amiodarone  History of home O2 dependent COPD -Continue Spiriva and  Symbicort   History of chronic kidney disease, stage III -Creatinine is at or better than baseline. Monitor and repeat BMP in a.m.  History of hypertension -Continue Entresto  History of coronary artery disease -Continue aspirin   History of GERD -Continue Protonix   History of constipation -Continue MiraLAX as needed   History of allergic rhinitis -Continue Astelin and Singulair    Code Status : DNR, confirmed by patient  Family Communication  : none at bedside  Disposition  Plan  : pending PT evaluation  Consults  :  None  DVT Prophylaxis  :  Lovenox - SCDs   Lab Results  Component Value Date   PLT 180 08/06/2016    Antibiotics  :    Anti-infectives    Start     Dose/Rate Route Frequency Ordered Stop   08/07/16 1300  azithromycin (ZITHROMAX) 500 mg in dextrose 5 % 250 mL IVPB     500 mg 250 mL/hr over 60 Minutes Intravenous Every 24 hours 08/06/16 1459 08/14/16 1259   08/07/16 1200  cefTRIAXone (ROCEPHIN) 1 g in dextrose 5 % 50 mL IVPB     1 g 100 mL/hr over 30 Minutes Intravenous Every 24 hours 08/06/16 1459 08/14/16 1159   08/06/16 1115  cefTRIAXone (ROCEPHIN) 1 g in dextrose 5 % 50 mL IVPB     1 g 100 mL/hr over 30 Minutes Intravenous  Once 08/06/16 1109 08/06/16 1230   08/06/16 1115  azithromycin (ZITHROMAX) 500 mg in dextrose 5 % 250 mL IVPB     500 mg 250 mL/hr over 60 Minutes Intravenous  Once 08/06/16 1109 08/06/16 1349        Objective:   Vitals:   08/07/16 0503 08/07/16 0800 08/07/16 0835 08/07/16 1200  BP:  133/71  124/70  Pulse:  74  76  Resp:  20  20  Temp:  98 F (36.7 C)  97.8 F (36.6 C)  TempSrc:  Oral  Oral  SpO2:  93% 90% 92%  Weight: 56.8 kg (125 lb 3.2 oz)     Height:        Wt Readings from Last 3 Encounters:  08/07/16 56.8 kg (125 lb 3.2 oz)  08/03/16 59.9 kg (132 lb)  07/20/16 60.3 kg (133 lb)     Intake/Output Summary (Last 24 hours) at 08/07/16 1224 Last data filed at 08/07/16 0500  Gross per 24 hour    Intake              480 ml  Output             1075 ml  Net             -595 ml     Physical Exam  Awake Alert, Oriented X 3,  [place].AT,PERRAL Supple Neck,No JVD, No cervical lymphadenopathy appriciated.  Symmetrical Chest wall movement, Good air movement bilaterally, no wheezing RRR,No Gallops,Rubs or new Murmurs, No Parasternal Heave +ve B.Sounds, Abd Soft, No tenderness, No rebound - guarding or rigidity. No Cyanosis, Clubbing or edema, No new Rash or bruise      Data Review:    CBC  Recent Labs Lab 08/06/16 1006 08/06/16 1732  WBC 8.1 8.5  HGB 15.5 15.3  HCT 47.3 45.6  PLT 191 180  MCV 100.9* 100.7*  MCH 33.0 33.8  MCHC 32.8 33.6  RDW 14.3 14.1  LYMPHSABS 0.9  --   MONOABS 1.6*  --   EOSABS 0.0  --   BASOSABS 0.0  --     Chemistries   Recent Labs Lab 08/06/16 1006 08/06/16 1732  NA 136  --   K 4.5  --   CL 102  --   CO2 24  --   GLUCOSE 101*  --   BUN 19  --   CREATININE 1.26* 1.07  CALCIUM 9.3  --   AST 34  --   ALT 38  --   ALKPHOS 57  --   BILITOT 1.0  --    ------------------------------------------------------------------------------------------------------------------  No results for input(s): CHOL, HDL, LDLCALC, TRIG, CHOLHDL, LDLDIRECT in the last 72 hours.  Lab Results  Component Value Date   HGBA1C 5.6 02/05/2016   ------------------------------------------------------------------------------------------------------------------ No results for input(s): TSH, T4TOTAL, T3FREE, THYROIDAB in the last 72 hours.  Invalid input(s): FREET3 ------------------------------------------------------------------------------------------------------------------ No results for input(s): VITAMINB12, FOLATE, FERRITIN, TIBC, IRON, RETICCTPCT in the last 72 hours.  Coagulation profile No results for input(s): INR, PROTIME in the last 168 hours.  No results for input(s): DDIMER in the last 72 hours.  Cardiac Enzymes No results for input(s): CKMB,  TROPONINI, MYOGLOBIN in the last 168 hours.  Invalid input(s): CK ------------------------------------------------------------------------------------------------------------------    Component Value Date/Time   BNP 54.7 08/06/2016 1109   BNP 26.5 01/05/2016 1014    Inpatient Medications  Scheduled Meds: . amiodarone  100 mg Oral Daily  . aspirin EC  81 mg Oral Daily  . azelastine  2 spray Each Nare BID  . azithromycin  500 mg Intravenous Q24H  . calcium-vitamin D  1 tablet Oral Q breakfast  . cefTRIAXone (ROCEPHIN)  IV  1 g Intravenous Q24H  . dextromethorphan-guaiFENesin  1 tablet Oral BID  . enoxaparin (LOVENOX) injection  30 mg Subcutaneous Q24H  . feeding supplement (ENSURE ENLIVE)  237 mL Oral BID BM  . methylPREDNISolone (SOLU-MEDROL) injection  60 mg Intravenous Q6H  . mometasone-formoterol  2 puff Inhalation BID  . montelukast  10 mg Oral QHS  . multivitamin with minerals  1 tablet Oral Daily  . pantoprazole  40 mg Oral Daily  . sacubitril-valsartan  1 tablet Oral BID  . sodium chloride flush  3 mL Intravenous Q12H  . spironolactone  12.5 mg Oral Daily  . tiotropium  18 mcg Inhalation Daily   Continuous Infusions: . sodium chloride 50 mL/hr at 08/07/16 0822   PRN Meds:.sodium chloride, albuterol, polyethylene glycol, sodium chloride flush  Micro Results No results found for this or any previous visit (from the past 240 hour(s)).  Radiology Reports Dg Chest 2 View  Result Date: 08/06/2016 CLINICAL DATA:  Worsening shortness of breath over the past 2-3 days. History of COPD. EXAM: CHEST  2 VIEW COMPARISON:  PA and lateral chest 01/28/2016 and 06/30/2015. FINDINGS: Extensive bullous emphysematous change and distortion of the pulmonary architecture are again seen. The chest is hyperexpanded. Right lower lobe calcified granuloma is unchanged. There is new airspace disease in the left lower lobe. No pneumothorax or pleural fluid. Heart size normal. Aortic  atherosclerosis noted. IMPRESSION: Left lower lobe airspace disease most consistent with pneumonia. Recommend followup to clearing. Emphysema. Electronically Signed   By: Inge Rise M.D.   On: 08/06/2016 10:30     ELGERGAWY, DAWOOD M.D on 08/07/2016 at 12:24 PM  Between 7am to 7pm - Pager - [phone]  After 7pm go to [email or web address] - password [facility]  [facility] -  Office  [phone]

## 2016-08-07 NOTE — Evaluation (Signed)
Physical Therapy Evaluation Patient Details Name: Bruce Travis MRN: [id number] DOB: [birth year] Today's Date: 08/07/2016   History of Present Illness  Pt is a 81 y/o male admitted secondary to SOB, found to have CAP. PMH including but not limited to CAD s/p MI, CHF, COPD (O2 dependent), HTN, CKD stage III, and celiac disease.  Clinical Impression  Pt presented supine in bed with HOB elevated, awake and willing to participate in therapy session. Prior to admission, pt reported that he was independent with all functional mobility and ADLs. Pt did report that he is dependent on 2L of O2 at all times. Pt currently very limited with distance ambulated secondary to fatigue. Pt would continue to benefit from skilled physical therapy services at this time while admitted and after d/c to address his below listed limitations in order to improve his overall safety and independence with functional mobility.     Follow Up Recommendations Home health PT;Supervision for mobility/OOB    Equipment Recommendations  None recommended by PT    Recommendations for Other Services       Precautions / Restrictions Precautions Precautions: Fall Precaution Comments: O2 dependent Restrictions Weight Bearing Restrictions: No      Mobility  Bed Mobility Overal bed mobility: Modified Independent             General bed mobility comments: increased time  Transfers Overall transfer level: Needs assistance Equipment used: None Transfers: Sit to/from Stand Sit to Stand: Supervision         General transfer comment: no instability, supervision for safety  Ambulation/Gait Ambulation/Gait assistance: Min guard Ambulation Distance (Feet): 20 Feet Assistive device: None Gait Pattern/deviations: Step-through pattern;Decreased step length - right;Decreased step length - left;Decreased stride length Gait velocity: decreased Gait velocity interpretation: Below normal speed for age/gender General Gait  Details: pt very limited secondary to fatigue with ambulation and feeling SOB. no instability noted  Stairs            Wheelchair Mobility    Modified Rankin (Stroke Patients Only)       Balance Overall balance assessment: Needs assistance Sitting-balance support: Feet supported;No upper extremity supported Sitting balance-Leahy Scale: Good     Standing balance support: During functional activity;No upper extremity supported Standing balance-Leahy Scale: Fair                               Pertinent Vitals/Pain Pain Assessment: No/denies pain    Home Living Family/patient expects to be discharged to:: Private residence Living Arrangements: Spouse/significant other Available Help at Discharge: Family;Available PRN/intermittently Type of Home: House Home Access: Stairs to enter Entrance Stairs-Rails: Right;Left Entrance Stairs-Number of Steps: 2 Home Layout: Two level;Laundry or work area in [employer]: None      Prior Function Level of Independence: Independent         Comments: dependent on home O2     Hand Dominance        Extremity/Trunk Assessment   Upper Extremity Assessment Upper Extremity Assessment: Overall WFL for tasks assessed    Lower Extremity Assessment Lower Extremity Assessment: Overall WFL for tasks assessed    Cervical / Trunk Assessment Cervical / Trunk Assessment: Normal  Communication   Communication: No difficulties  Cognition Arousal/Alertness: Awake/alert Behavior During Therapy: WFL for tasks assessed/performed Overall Cognitive Status: Within Functional Limits for tasks assessed  General Comments      Exercises     Assessment/Plan    PT Assessment Patient needs continued PT services  PT Problem List Decreased activity tolerance;Decreased balance;Decreased mobility;Decreased coordination;Decreased knowledge of use of DME;Decreased safety  awareness;Cardiopulmonary status limiting activity          PT Treatment Interventions DME instruction;Gait training;Stair training;Functional mobility training;Therapeutic activities;Therapeutic exercise;Balance training;Neuromuscular re-education;Patient/family education    PT Goals (Current goals can be found in the Care Plan section)  Acute Rehab PT Goals Patient Stated Goal: return home PT Goal Formulation: With patient/family Time For Goal Achievement: 08/21/16 Potential to Achieve Goals: Good    Frequency Min 3X/week   Barriers to discharge        Co-evaluation               End of Session Equipment Utilized During Treatment: Oxygen Activity Tolerance: Patient limited by fatigue Patient left: in bed;with call bell/phone within reach;with family/visitor present Nurse Communication: Mobility status         Time: 1531-1550 PT Time Calculation (min) (ACUTE ONLY): 19 min   Charges:   PT Evaluation $PT Eval Moderate Complexity: 1 Procedure     PT G CodesClearnce Sorrel Randle Shatzer 08/07/2016, 3:44 PM [name], Latta, DPT [phone]

## 2016-08-08 DIAGNOSIS — J9621 Acute and chronic respiratory failure with hypoxia: Secondary | ICD-10-CM

## 2016-08-08 DIAGNOSIS — I10 Essential (primary) hypertension: Secondary | ICD-10-CM

## 2016-08-08 DIAGNOSIS — I5022 Chronic systolic (congestive) heart failure: Secondary | ICD-10-CM

## 2016-08-08 DIAGNOSIS — J181 Lobar pneumonia, unspecified organism: Secondary | ICD-10-CM

## 2016-08-08 LAB — CBC
HEMATOCRIT: 40.9 % (ref 39.0–52.0)
HEMOGLOBIN: 13.5 g/dL (ref 13.0–17.0)
MCH: 32.8 pg (ref 26.0–34.0)
MCHC: 33 g/dL (ref 30.0–36.0)
MCV: 99.5 fL (ref 78.0–100.0)
Platelets: 183 10*3/uL (ref 150–400)
RBC: 4.11 MIL/uL — AB (ref 4.22–5.81)
RDW: 14 % (ref 11.5–15.5)
WBC: 10.7 10*3/uL — ABNORMAL HIGH (ref 4.0–10.5)

## 2016-08-08 LAB — BASIC METABOLIC PANEL
ANION GAP: 8 (ref 5–15)
BUN: 25 mg/dL — ABNORMAL HIGH (ref 6–20)
CALCIUM: 8.7 mg/dL — AB (ref 8.9–10.3)
CHLORIDE: 105 mmol/L (ref 101–111)
CO2: 24 mmol/L (ref 22–32)
Creatinine, Ser: 1.06 mg/dL (ref 0.61–1.24)
GFR calc non Af Amer: >60 mL/min (ref >60)
Glucose, Bld: 145 mg/dL — ABNORMAL HIGH (ref 65–99)
Potassium: 4.1 mmol/L (ref 3.5–5.1)
Sodium: 137 mmol/L (ref 135–145)

## 2016-08-08 MED ORDER — ENOXAPARIN SODIUM 40 MG/0.4ML ~~LOC~~ SOLN
40.0000 mg | SUBCUTANEOUS | Status: DC
  Administered 2016-08-09: 40 mg via SUBCUTANEOUS
  Filled 2016-08-08: qty 0.4

## 2016-08-08 MED ORDER — SENNA 8.6 MG PO TABS
2.0000 | ORAL_TABLET | Freq: Every day | ORAL | Status: DC
  Administered 2016-08-08 – 2016-08-09 (×2): 17.2 mg via ORAL
  Filled 2016-08-08 (×2): qty 2

## 2016-08-08 MED ORDER — METHYLPREDNISOLONE SODIUM SUCC 40 MG IJ SOLR
40.0000 mg | Freq: Two times a day (BID) | INTRAMUSCULAR | Status: DC
  Administered 2016-08-08 – 2016-08-09 (×2): 40 mg via INTRAVENOUS
  Filled 2016-08-08 (×2): qty 1

## 2016-08-08 MED ORDER — POLYETHYLENE GLYCOL 3350 17 G PO PACK
17.0000 g | PACK | Freq: Two times a day (BID) | ORAL | Status: DC
  Filled 2016-08-08: qty 1

## 2016-08-08 MED ORDER — ALBUTEROL SULFATE (2.5 MG/3ML) 0.083% IN NEBU: 2.5000 mg | INHALATION_SOLUTION | RESPIRATORY_TRACT | Status: DC | PRN

## 2016-08-08 NOTE — Progress Notes (Addendum)
PROGRESS NOTE  Bruce Travis  HQI:[id number] DOB: 02-Dec-1934  DOA: 08/06/2016 PCP: Odette Fraction, MD   Brief Narrative:  81 y.o.malewith medical history significant of coronary artery disease s/p MI, chronic systolic congestive heart failure, chronic kidney disease stage III, home O2 dependent COPD, hypertension and hyperlipidemia presents to the emergency department for evaluation of shortness of breath and cough, workup significant for left lower lobe pneumonia.   Assessment & Plan:   Active Problems:   Community acquired pneumonia of left lower lobe of lung ([place])   1. Community-acquired pneumonia, LLL: Patient had productive cough on admission and chest x-ray shows left lower lobe airspace disease. Blood cultures 2: Negative. Unfortunately sputum culture, urinary pneumococcal and Legionella antigen were never sent. Treated empirically with IV ceftriaxone and azithromycin. Clinically improving. Continue additional 24 hours of IV antibiotics and then transition to oral antibiotics at discharge. Recommend repeat chest x-ray in 3-4 weeks to ensure resolution of pneumonia findings. 2. Acute on chronic hypoxic respiratory failure: On home oxygen 2 L/m at baseline. Currently on 4 L/m. This is secondary to pneumonia. Titrate down oxygen to prior home dose. 3. Chronic systolic CHF: Clinically compensated. Continue Aldactone, Entresto and amiodarone. Systolic heart failure in the past thought to be secondary to multiple PVCs and improved after starting amiodarone. DC IV fluids. 4. Oxygen-dependent COPD: No significant bronchospasm. Continue Spiriva and Symbicort. Rapidly taper steroids. 5. Stage III chronic kidney disease: Creatinine has normalized. 6. Essential hypertension: Controlled. 7. CAD: Stable without chest pain. 8. GERD: Protonix 9. Constipation: Adjust bowel regimen due to ongoing symptoms.   DVT prophylaxis: Lovenox Code Status: DO NOT RESUSCITATE Family Communication: None at  bedside Disposition Plan: DC home possibly in the next 1-2 days with home health PT.   Consultants:   None  Procedures:   None  Antimicrobials:   IV ceftriaxone and azithromycin 08/06/16 >    Subjective: States that he he continues to feel better. Improved cough. Decreased production. Dyspnea improved but not at baseline. No chest pain reported. Constipation.  Objective:  Vitals:   08/07/16 2333 08/08/16 0412 08/08/16 0911 08/08/16 1020  BP: 117/67 103/62    Pulse: 69 72  64  Resp: 18 18    Temp: 97.9 F (36.6 C) 98.4 F (36.9 C)    TempSrc: Oral Oral    SpO2: 90% 93% 91% 91%  Weight:  57.7 kg (127 lb 3.2 oz)    Height:        Intake/Output Summary (Last 24 hours) at 08/08/16 1038 Last data filed at 08/08/16 0749  Gross per 24 hour  Intake          2439.16 ml  Output             2351 ml  Net            88.16 ml   Filed Weights   08/06/16 1433 08/07/16 0503 08/08/16 0412  Weight: 57.2 kg (126 lb 3.2 oz) 56.8 kg (125 lb 3.2 oz) 57.7 kg (127 lb 3.2 oz)    Examination:  General exam: Pleasant elderly male sitting up comfortably in bed without distress. Respiratory system: Slightly harsh breath sounds. Occasional left basal crackles. No wheezing or rhonchi. Respiratory effort normal. Cardiovascular system: S1 & S2 heard, RRR. No JVD, murmurs, rubs, gallops or clicks. No pedal edema. Telemetry: Sinus bradycardia in the 50s-sinus rhythm, BBB morphology. Gastrointestinal system: Abdomen is nondistended, soft and nontender. No organomegaly or masses felt. Normal bowel sounds heard. Central nervous system: Alert  and oriented. No focal neurological deficits. Extremities: Symmetric 5 x 5 power. Skin: No rashes, lesions or ulcers Psychiatry: Judgement and insight appear normal. Mood & affect appropriate.     Data Reviewed: I have personally reviewed following labs and imaging studies  CBC:  Recent Labs Lab 08/06/16 1006 08/06/16 1732 08/08/16 0457  WBC 8.1 8.5  10.7*  NEUTROABS 5.5  --   --   HGB 15.5 15.3 13.5  HCT 47.3 45.6 40.9  MCV 100.9* 100.7* 99.5  PLT 191 180 220   Basic Metabolic Panel:  Recent Labs Lab 08/06/16 1006 08/06/16 1732 08/08/16 0457  NA 136  --  137  K 4.5  --  4.1  CL 102  --  105  CO2 24  --  24  GLUCOSE 101*  --  145*  BUN 19  --  25*  CREATININE 1.26* 1.07 1.06  CALCIUM 9.3  --  8.7*   GFR: Estimated Creatinine Clearance: 46.1 mL/min (by C-G formula based on SCr of 1.06 mg/dL). Liver Function Tests:  Recent Labs Lab 08/06/16 1006  AST 34  ALT 38  ALKPHOS 57  BILITOT 1.0  PROT 6.2*  ALBUMIN 3.4*   No results for input(s): LIPASE, AMYLASE in the last 168 hours. No results for input(s): AMMONIA in the last 168 hours. Coagulation Profile: No results for input(s): INR, PROTIME in the last 168 hours. Cardiac Enzymes: No results for input(s): CKTOTAL, CKMB, CKMBINDEX, TROPONINI in the last 168 hours. BNP (last 3 results) No results for input(s): PROBNP in the last 8760 hours. HbA1C: No results for input(s): HGBA1C in the last 72 hours. CBG: No results for input(s): GLUCAP in the last 168 hours. Lipid Profile: No results for input(s): CHOL, HDL, LDLCALC, TRIG, CHOLHDL, LDLDIRECT in the last 72 hours. Thyroid Function Tests: No results for input(s): TSH, T4TOTAL, FREET4, T3FREE, THYROIDAB in the last 72 hours. Anemia Panel: No results for input(s): VITAMINB12, FOLATE, FERRITIN, TIBC, IRON, RETICCTPCT in the last 72 hours.  Sepsis Labs:  Recent Labs Lab 08/06/16 1015 08/06/16 1241  LATICACIDVEN 1.47 1.05    Recent Results (from the past 240 hour(s))  Culture, blood (routine x 2) Call MD if unable to obtain prior to antibiotics being given     Status: None (Preliminary result)   Collection Time: 08/06/16  5:32 PM  Result Value Ref Range Status   Specimen Description BLOOD LEFT ARM  Final   Special Requests BOTTLES DRAWN AEROBIC AND ANAEROBIC 5CC EA  Final   Culture NO GROWTH < 24 HOURS   Final   Report Status PENDING  Incomplete  Culture, blood (routine x 2) Call MD if unable to obtain prior to antibiotics being given     Status: None (Preliminary result)   Collection Time: 08/06/16  5:32 PM  Result Value Ref Range Status   Specimen Description BLOOD RIGHT ARM  Final   Special Requests BOTTLES DRAWN AEROBIC AND ANAEROBIC 5CC EA  Final   Culture NO GROWTH < 24 HOURS  Final   Report Status PENDING  Incomplete         Radiology Studies: No results found.      Scheduled Meds: . amiodarone  100 mg Oral Daily  . aspirin EC  81 mg Oral Daily  . azelastine  2 spray Each Nare BID  . azithromycin  500 mg Intravenous Q24H  . calcium-vitamin D  1 tablet Oral Q breakfast  . cefTRIAXone (ROCEPHIN)  IV  1 g Intravenous Q24H  . dextromethorphan-guaiFENesin  1 tablet Oral BID  . enoxaparin (LOVENOX) injection  30 mg Subcutaneous Q24H  . feeding supplement (ENSURE ENLIVE)  237 mL Oral BID BM  . methylPREDNISolone (SOLU-MEDROL) injection  60 mg Intravenous Q6H  . mometasone-formoterol  2 puff Inhalation BID  . montelukast  10 mg Oral QHS  . multivitamin with minerals  1 tablet Oral Daily  . pantoprazole  40 mg Oral Daily  . sacubitril-valsartan  1 tablet Oral BID  . sodium chloride flush  3 mL Intravenous Q12H  . spironolactone  12.5 mg Oral Daily  . tiotropium  18 mcg Inhalation Daily   Continuous Infusions: . sodium chloride 50 mL/hr at 08/08/16 0753     LOS: 2 days       [facility], MD [facility] Pager [phone]  If 7PM-7AM, please contact night-coverage [email or web address] Password [email or web address] 08/08/2016, 10:38 AM

## 2016-08-09 DIAGNOSIS — J441 Chronic obstructive pulmonary disease with (acute) exacerbation: Secondary | ICD-10-CM

## 2016-08-09 MED ORDER — AMOXICILLIN 500 MG PO TABS: 1000.0000 mg | ORAL_TABLET | Freq: Three times a day (TID) | ORAL | 0 refills | Status: DC

## 2016-08-09 MED ORDER — DEXTROSE 5 % IV SOLN
500.0000 mg | INTRAVENOUS | Status: DC
  Administered 2016-08-09: 500 mg via INTRAVENOUS
  Filled 2016-08-09: qty 500

## 2016-08-09 MED ORDER — DEXTROSE 5 % IV SOLN
500.0000 mg | INTRAVENOUS | Status: DC
  Filled 2016-08-09: qty 500

## 2016-08-09 MED ORDER — PREDNISONE 10 MG PO TABS: ORAL_TABLET | ORAL | 0 refills | Status: DC

## 2016-08-09 MED ORDER — DEXTROSE 5 % IV SOLN
1.0000 g | INTRAVENOUS | Status: DC
  Administered 2016-08-09: 1 g via INTRAVENOUS
  Filled 2016-08-09: qty 10

## 2016-08-09 NOTE — Care Management Note (Signed)
Case Management Note  Patient Details  Name: Bruce Travis MRN: [id number] Date of Birth: Feb 06, 1935  Subjective/Objective:         Admitted with Pneumonia           Action/Plan: Patient lives at home with spouse; PCP: Odette Fraction, MD ; has private insurance with [facility] with prescription drug coverage; pharmacy of choice is [facility] in [place], they deliver his medication to his home; DME - home Oxygen through [facility]; CM talked to patient about [place], patient refused and stated " I want to go to Outpatient Cardiac Rehab." Referral placed as requested; they will contact patient with a start up date and time for therapy.   Expected Discharge Date:      Possibly 08/09/2016            Expected Discharge Plan:  Opp  Discharge planning Services  CM Consult  Choice offered to:  Patient   HH Arranged:  Patient Refused [facility] Agency:     Status of Service:  In process, will continue to follow  Sherrilyn Rist [phone] 08/09/2016, 10:17 AM

## 2016-08-09 NOTE — Discharge Instructions (Signed)
Community-Acquired Pneumonia, Adult Pneumonia is an infection of the lungs. There are different types of pneumonia. One type can develop while a person is in a hospital. A different type, called community-acquired pneumonia, develops in people who are not, or have not recently been, in the hospital or other health care facility. What are the causes? Pneumonia may be caused by bacteria, viruses, or funguses. Community-acquired pneumonia is often caused by Streptococcus pneumonia bacteria. These bacteria are often passed from one person to another by breathing in droplets from the cough or sneeze of an infected person. What increases the risk? The condition is more likely to develop in:  People who havechronic diseases, such as chronic obstructive pulmonary disease (COPD), asthma, congestive heart failure, cystic fibrosis, diabetes, or kidney disease.  People who haveearly-stage or late-stage HIV.  People who havesickle cell disease.  People who havehad their spleen removed (splenectomy).  People who havepoor Human resources officer.  People who havemedical conditions that increase the risk of breathing in (aspirating) secretions their own mouth and nose.  People who havea weakened immune system (immunocompromised).  People who smoke.  People whotravel to areas where pneumonia-causing germs commonly exist.  People whoare around animal habitats or animals that have pneumonia-causing germs, including birds, bats, rabbits, cats, and farm animals. What are the signs or symptoms? Symptoms of this condition include:  Adry cough.  A wet (productive) cough.  Fever.  Sweating.  Chest pain, especially when breathing deeply or coughing.  Rapid breathing or difficulty breathing.  Shortness of breath.  Shaking chills.  Fatigue.  Muscle aches. How is this diagnosed? Your health care provider will take a medical history and perform a physical exam. You may also have other tests,  including:  Imaging studies of your chest, including X-rays.  Tests to check your blood oxygen level and other blood gases.  Other tests on blood, mucus (sputum), fluid around your lungs (pleural fluid), and urine. If your pneumonia is severe, other tests may be done to identify the specific cause of your illness. How is this treated? The type of treatment that you receive depends on many factors, such as the cause of your pneumonia, the medicines you take, and other medical conditions that you have. For most adults, treatment and recovery from pneumonia may occur at home. In some cases, treatment must happen in a hospital. Treatment may include:  Antibiotic medicines, if the pneumonia was caused by bacteria.  Antiviral medicines, if the pneumonia was caused by a virus.  Medicines that are given by mouth or through an IV tube.  Oxygen.  Respiratory therapy. Although rare, treating severe pneumonia may include:  Mechanical ventilation. This is done if you are not breathing well on your own and you cannot maintain a safe blood oxygen level.  Thoracentesis. This procedureremoves fluid around one lung or both lungs to help you breathe better. Follow these instructions at home:  Take over-the-counter and prescription medicines only as told by your health care provider.  Only takecough medicine if you are losing sleep. Understand that cough medicine can prevent your bodys natural ability to remove mucus from your lungs.  If you were prescribed an antibiotic medicine, take it as told by your health care provider. Do not stop taking the antibiotic even if you start to feel better.  Sleep in a semi-upright position at night. Try sleeping in a reclining chair, or place a few pillows under your head.  Do not use tobacco products, including cigarettes, chewing tobacco, and e-cigarettes. If you  need help quitting, ask your health care provider.  Drink enough water to keep your urine clear  or pale yellow. This will help to thin out mucus secretions in your lungs. How is this prevented? There are ways that you can decrease your risk of developing community-acquired pneumonia. Consider getting a pneumococcal vaccine if:  You are older than 79 years of age.  You are older than 79 years of age and are undergoing cancer treatment, have chronic lung disease, or have other medical conditions that affect your immune system. Ask your health care provider if this applies to you. There are different types and schedules of pneumococcal vaccines. Ask your health care provider which vaccination option is best for you. You may also prevent community-acquired pneumonia if you take these actions:  Get an influenza vaccine every year. Ask your health care provider which type of influenza vaccine is best for you.  Go to the dentist on a regular basis.  Wash your hands often. Use hand sanitizer if soap and water are not available. Contact a health care provider if:  You have a fever.  You are losing sleep because you cannot control your cough with cough medicine. Get help right away if:  You have worsening shortness of breath.  You have increased chest pain.  Your sickness becomes worse, especially if you are an older adult or have a weakened immune system.  You cough up blood. This information is not intended to replace advice given to you by your health care provider. Make sure you discuss any questions you have with your health care provider. Document Released: 08/09/2005 Document Revised: 12/18/2015 Document Reviewed: 12/04/2014 Elsevier Interactive Patient Education  2017 [employer]  Chronic Obstructive Pulmonary Disease Chronic obstructive pulmonary disease (COPD) is a common lung condition in which airflow from the lungs is limited. COPD is a general term that can be used to describe many different lung problems that limit airflow, including both chronic bronchitis and  emphysema. If you have COPD, your lung function will probably never return to normal, but there are measures you can take to improve lung function and make yourself feel better. What are the causes?  Smoking (common).  Exposure to secondhand smoke.  Genetic problems.  Chronic inflammatory lung diseases or recurrent infections. What are the signs or symptoms?  Shortness of breath, especially with physical activity.  Deep, persistent (chronic) cough with a large amount of thick mucus.  Wheezing.  Rapid breaths (tachypnea).  Gray or bluish discoloration (cyanosis) of the skin, especially in your fingers, toes, or lips.  Fatigue.  Weight loss.  Frequent infections or episodes when breathing symptoms become much worse (exacerbations).  Chest tightness. How is this diagnosed? Your health care provider will take a medical history and perform a physical examination to diagnose COPD. Additional tests for COPD may include:  Lung (pulmonary) function tests.  Chest X-ray.  CT scan.  Blood tests. How is this treated? Treatment for COPD may include:  Inhaler and nebulizer medicines. These help manage the symptoms of COPD and make your breathing more comfortable.  Supplemental oxygen. Supplemental oxygen is only helpful if you have a low oxygen level in your blood.  Exercise and physical activity. These are beneficial for nearly all people with COPD.  Lung surgery or transplant.  Nutrition therapy to gain weight, if you are underweight.  Pulmonary rehabilitation. This may involve working with a team of health care providers and specialists, such as respiratory, occupational, and physical therapists. Follow these instructions  at home:  Take all medicines (inhaled or pills) as directed by your health care provider.  Avoid over-the-counter medicines or cough syrups that dry up your airway (such as antihistamines) and slow down the elimination of secretions unless instructed  otherwise by your health care provider.  If you are a smoker, the most important thing that you can do is stop smoking. Continuing to smoke will cause further lung damage and breathing trouble. Ask your health care provider for help with quitting smoking. He or she can direct you to community resources or hospitals that provide support.  Avoid exposure to irritants such as smoke, chemicals, and fumes that aggravate your breathing.  Use oxygen therapy and pulmonary rehabilitation if directed by your health care provider. If you require home oxygen therapy, ask your health care provider whether you should purchase a pulse oximeter to measure your oxygen level at home.  Avoid contact with individuals who have a contagious illness.  Avoid extreme temperature and humidity changes.  Eat healthy foods. Eating smaller, more frequent meals and resting before meals may help you maintain your strength.  Stay active, but balance activity with periods of rest. Exercise and physical activity will help you maintain your ability to do things you want to do.  Preventing infection and hospitalization is very important when you have COPD. Make sure to receive all the vaccines your health care provider recommends, especially the pneumococcal and influenza vaccines. Ask your health care provider whether you need a pneumonia vaccine.  Learn and use relaxation techniques to manage stress.  Learn and use controlled breathing techniques as directed by your health care provider. Controlled breathing techniques include: 1. Pursed lip breathing. Start by breathing in (inhaling) through your nose for 1 second. Then, purse your lips as if you were going to whistle and breathe out (exhale) through the pursed lips for 2 seconds. 2. Diaphragmatic breathing. Start by putting one hand on your abdomen just above your waist. Inhale slowly through your nose. The hand on your abdomen should move out. Then purse your lips and exhale  slowly. You should be able to feel the hand on your abdomen moving in as you exhale.  Learn and use controlled coughing to clear mucus from your lungs. Controlled coughing is a series of short, progressive coughs. The steps of controlled coughing are: 1. Lean your head slightly forward. 2. Breathe in deeply using diaphragmatic breathing. 3. Try to hold your breath for 3 seconds. 4. Keep your mouth slightly open while coughing twice. 5. Spit any mucus out into a tissue. 6. Rest and repeat the steps once or twice as needed. Contact a health care provider if:  You are coughing up more mucus than usual.  There is a change in the color or thickness of your mucus.  Your breathing is more labored than usual.  Your breathing is faster than usual. Get help right away if:  You have shortness of breath while you are resting.  You have shortness of breath that prevents you from:  Being able to talk.  Performing your usual physical activities.  You have chest pain lasting longer than 5 minutes.  Your skin color is more cyanotic than usual.  You measure low oxygen saturations for longer than 5 minutes with a pulse oximeter. This information is not intended to replace advice given to you by your health care provider. Make sure you discuss any questions you have with your health care provider. Document Released: 05/19/2005 Document Revised: 01/15/2016 Document Reviewed:  04/05/2013 Elsevier Interactive Patient Education  2017 [employer].

## 2016-08-09 NOTE — Progress Notes (Signed)
All d/c instructions explained and given to pt and her daughter at the bedside.  Verbalized understanding. D/c off floor at 1450.  Karie Kirks, RN

## 2016-08-09 NOTE — Discharge Summary (Signed)
Physician Discharge Summary  Bruce Travis RCV:[id number] DOB: [birth year]  PCP: Odette Fraction, MD  Admit date: 08/06/2016 Discharge date: 08/09/2016  Recommendations for Outpatient Follow-up:  1. Dr. Jenna Luo, PCP on 08/12/16. Patient has already contacted PCPs office and was advised that he will be seen on this date. Patient can call regarding timing. 2. Recommend repeating chest x-ray in 3-4 weeks to ensure resolution of pneumonia findings. 3. Please follow final blood culture results that were sent from the hospital.   Home Health: PT Equipment/Devices: None    Discharge Condition: Improved and stable  CODE STATUS: DO NOT RESUSCITATE  Diet recommendation: Heart healthy diet  Discharge Diagnoses:  Active Problems:   Community acquired pneumonia of left lower lobe of lung ([place])   Brief/Interim Summary: 81 y.o.malewith medical history significant of coronary artery disease s/p MI, chronic systolic congestive heart failure, chronic kidney disease stage III, home O2 dependent COPD, hypertension and hyperlipidemia presents to the emergency department for evaluation of shortness of breath and cough,workup significant for left lower lobe pneumonia.   Assessment & Plan:   1. Community-acquired pneumonia, LLL: Patient had productive cough on admission and chest x-ray showed left lower lobe airspace disease. Blood cultures 2: Negative to date. Unfortunately sputum culture, urinary pneumococcal and Legionella antigen were never sent. Treated empirically with IV ceftriaxone and azithromycin and has completed 4 days treatment. Clinically improved. Transitioned to oral amoxicillin 1 g 3 times a day to complete total 7 days treatment. Recommend repeat chest x-ray in 3-4 weeks to ensure resolution of pneumonia findings. 2. Acute on chronic hypoxic respiratory failure: On home oxygen 2 L/m at rest and 3 L/m with activity, at baseline. Acute respiratory failure secondary to pneumonia and  mild COPD exacerbation complicating underlying COPD. Symptomatically improved. Patient is aware of titrating oxygen with activity to maintain sats >88 based on the pulse ox he has at home. 3. Chronic systolic CHF: Clinically compensated. Continue Aldactone, Entresto and amiodarone. Systolic heart failure in the past thought to be secondary to multiple PVCs and improved after starting amiodarone.  4. Oxygen-dependent COPD: No significant bronchospasm. Continue Spiriva and Symbicort. Complete course of antibiotics and steroid taper. Patient indicates that he is not on chronic prednisone and was recently placed on pulse steroids by his PCP. He took the last dose prior to admission. 5. Stage III chronic kidney disease: Creatinine has normalized. 6. Essential hypertension: Controlled. 7. CAD: Stable without chest pain. 8. GERD: Protonix 9. Constipation: Adjusted bowel regimen due to ongoing symptoms.   Consultants:   None  Procedures:   None   Discharge Instructions  Discharge Instructions    (HEART FAILURE PATIENTS) Call MD:  Anytime you have any of the following symptoms: 1) 3 pound weight gain in 24 hours or 5 pounds in 1 week 2) shortness of breath, with or without a dry hacking cough 3) swelling in the hands, feet or stomach 4) if you have to sleep on extra pillows at night in order to breathe.    Complete by:  As directed    Call MD for:  difficulty breathing, headache or visual disturbances    Complete by:  As directed    Call MD for:  extreme fatigue    Complete by:  As directed    Call MD for:  persistant dizziness or light-headedness    Complete by:  As directed    Call MD for:  temperature >100.4    Complete by:  As directed    Diet -  low sodium heart healthy    Complete by:  As directed    Increase activity slowly    Complete by:  As directed        Medication List    TAKE these medications   albuterol 108 (90 Base) MCG/ACT inhaler Commonly known as:  PROAIR  HFA INHALE 2 PUFFS EVERY 4 HOURS AS NEEDED FOR SHORTNESS OF BREATH.   AMBULATORY NON FORMULARY MEDICATION O2 2 Lpm continuous   amiodarone 200 MG tablet Commonly known as:  PACERONE Take 0.5 tablets (100 mg total) by mouth daily.   amoxicillin 500 MG tablet Commonly known as:  AMOXIL Take 2 tablets (1,000 mg total) by mouth 3 (three) times daily. Start taking on:  08/10/2016   aspirin EC 81 MG tablet Take 81 mg by mouth daily.   azelastine 0.1 % nasal spray Commonly known as:  ASTELIN PLACE 2 SPRAYS INTO EACH NOSTRILS TWICE DAILY AS DIRECTED.   CALTRATE 600+D 600-400 MG-UNIT tablet Generic drug:  Calcium Carbonate-Vitamin D Take 1 tablet by mouth daily.   montelukast 10 MG tablet Commonly known as:  SINGULAIR TAKE 1 TABLET BY MOUTH DAILY.   multivitamin with minerals Tabs tablet Take 1 tablet by mouth daily.   pantoprazole 40 MG tablet Commonly known as:  PROTONIX TAKE ONE TABLET BY MOUTH DAILY.   polyethylene glycol packet Commonly known as:  MIRALAX / GLYCOLAX Take 17 g by mouth daily as needed for mild constipation.   predniSONE 10 MG tablet Commonly known as:  DELTASONE Take 4 tabs daily for 3 days, then 3 tabs daily for 3 days, then 2 tabs daily for 3 days, then 1 tab daily for 3 days, then stop. What changed:  medication strength  how much to take  how to take this  when to take this  additional instructions   sacubitril-valsartan 49-51 MG Commonly known as:  ENTRESTO Take 1 tablet by mouth 2 (two) times daily.   SPIRIVA HANDIHALER 18 MCG inhalation capsule Generic drug:  tiotropium INHALE 1 CAPSULE ONCE DAILY AS DIRECTED   spironolactone 25 MG tablet Commonly known as:  ALDACTONE Take 0.5 tablets (12.5 mg total) by mouth daily.   SYMBICORT 160-4.5 MCG/ACT inhaler Generic drug:  budesonide-formoterol INHALE 2 PUFFS FIRST THING IN MORNING AND THEN ANOTHER 2 PUFFS ABOUT 12 HOURS LATER.      Follow-up Information    [facility] TOM, MD  Follow up on 08/12/2016.   Specialty:  Family Medicine Why:  Patient has already called PCPs office and is advised that he will be seen on this date. Patient can call office for timing. Contact information: [address] [place] [address] [phone]          Allergies  Allergen Reactions  . Adhesive [Tape] Other (See Comments)    Band-aids - tears skin off  . Codeine Other (See Comments)    Thought head was going to blow off  . Gluten Meal Other (See Comments)    Celiac disease    Procedures/Studies: Dg Chest 2 View  Result Date: 08/06/2016 CLINICAL DATA:  Worsening shortness of breath over the past 2-3 days. History of COPD. EXAM: CHEST  2 VIEW COMPARISON:  PA and lateral chest 01/28/2016 and 06/30/2015. FINDINGS: Extensive bullous emphysematous change and distortion of the pulmonary architecture are again seen. The chest is hyperexpanded. Right lower lobe calcified granuloma is unchanged. There is new airspace disease in the left lower lobe. No pneumothorax or pleural fluid. Heart size normal. Aortic  atherosclerosis noted. IMPRESSION: Left lower lobe airspace disease most consistent with pneumonia. Recommend followup to clearing. Emphysema. Electronically Signed   By: Inge Rise M.D.   On: 08/06/2016 10:30      Subjective: States that he feels much better. Dyspnea significantly improved. Cough improved, mild and mostly dry. No chest pain reported. Anxious to go home (has his 59th wedding anniversary on 12/21). As per RN, no acute issues reported.  Discharge Exam:  Vitals:   08/08/16 2221 08/09/16 0538 08/09/16 1141 08/09/16 1159  BP: 113/67 (!) 99/53  117/60  Pulse: 67 66  65  Resp: 18 18  18   Temp: 97.9 F (36.6 C) 98.2 F (36.8 C)  97.6 F (36.4 C)  TempSrc: Oral Oral  Oral  SpO2: 91% 93% 96% 94%  Weight:  57.4 kg (126 lb 8 oz)    Height:        General exam: Pleasant elderly male sitting up comfortably in bed without distress. Respiratory  system: Much improved breath sounds. Clear to auscultation with solitary rhonchi in the left base. No crackles. No increased work of breathing. Cardiovascular system: S1 & S2 heard, RRR. No JVD, murmurs, rubs, gallops or clicks. No pedal edema. Telemetry: Sinus bradycardia in the 50s-sinus rhythm, BBB morphology. Gastrointestinal system: Abdomen is nondistended, soft and nontender. No organomegaly or masses felt. Normal bowel sounds heard. Central nervous system: Alert and oriented. No focal neurological deficits. Extremities: Symmetric 5 x 5 power. Skin: No rashes, lesions or ulcers Psychiatry: Judgement and insight appear normal. Mood & affect appropriate.     The results of significant diagnostics from this hospitalization (including imaging, microbiology, ancillary and laboratory) are listed below for reference.     Microbiology: Recent Results (from the past 240 hour(s))  Culture, blood (routine x 2) Call MD if unable to obtain prior to antibiotics being given     Status: None (Preliminary result)   Collection Time: 08/06/16  5:32 PM  Result Value Ref Range Status   Specimen Description BLOOD LEFT ARM  Final   Special Requests BOTTLES DRAWN AEROBIC AND ANAEROBIC 5CC EA  Final   Culture NO GROWTH 2 DAYS  Final   Report Status PENDING  Incomplete  Culture, blood (routine x 2) Call MD if unable to obtain prior to antibiotics being given     Status: None (Preliminary result)   Collection Time: 08/06/16  5:32 PM  Result Value Ref Range Status   Specimen Description BLOOD RIGHT ARM  Final   Special Requests BOTTLES DRAWN AEROBIC AND ANAEROBIC 5CC EA  Final   Culture NO GROWTH 2 DAYS  Final   Report Status PENDING  Incomplete     Labs: BNP (last 3 results)  Recent Labs  01/05/16 1014 02/04/16 0451 08/06/16 1109  BNP 26.5 39.7 28.4   Basic Metabolic Panel:  Recent Labs Lab 08/06/16 1006 08/06/16 1732 08/08/16 0457  NA 136  --  137  K 4.5  --  4.1  CL 102  --  105  CO2  24  --  24  GLUCOSE 101*  --  145*  BUN 19  --  25*  CREATININE 1.26* 1.07 1.06  CALCIUM 9.3  --  8.7*   Liver Function Tests:  Recent Labs Lab 08/06/16 1006  AST 34  ALT 38  ALKPHOS 57  BILITOT 1.0  PROT 6.2*  ALBUMIN 3.4*   CBC:  Recent Labs Lab 08/06/16 1006 08/06/16 1732 08/08/16 0457  WBC 8.1 8.5 10.7*  NEUTROABS 5.5  --   --  HGB 15.5 15.3 13.5  HCT 47.3 45.6 40.9  MCV 100.9* 100.7* 99.5  PLT 191 180 183      Time coordinating discharge: Over 30 minutes  SIGNED:  Vernell Leep, MD, FACP, FHM. [facility] Pager [phone]  If 7PM-7AM, please contact night-coverage [email or web address] Password [email or web address] 08/09/2016, 1:55 PM

## 2016-08-09 NOTE — Progress Notes (Signed)
Physical Therapy Treatment Patient Details Name: Bruce Travis MRN: [id number] DOB: August 23, 1935 Today's Date: 08/09/2016    History of Present Illness Pt is a 81 y/o male admitted secondary to SOB, found to have CAP. PMH including but not limited to CAD s/p MI, CHF, COPD (O2 dependent), HTN, CKD stage III, and celiac disease.    PT Comments    Pt presenting with decreased activity tolerance during PT session. SpO2 90% at rest on 3.5L prior to session, incresed O2 to 4L during ambulation and SpO2 decreased to 85% (pulse 77) with recovery to 90% upon sitting and pursed breathing. Second ambulation, O2 at 6L at 91% prior and decreasing to 88% upon return (pulse 75). Mild instability noted during ambulation but pt refusing to use any assistive device. Recommending HHPT services following acute stay. Pt reports that his wife is able to assist him at home as needed.    Follow Up Recommendations  Home health PT;Supervision for mobility/OOB     Equipment Recommendations  None recommended by PT (pt refusing use of assistive device)    Recommendations for Other Services       Precautions / Restrictions Precautions Precautions: Fall Precaution Comments: O2 dependent Restrictions Weight Bearing Restrictions: No    Mobility  Bed Mobility               General bed mobility comments: in chair upon arrival  Transfers Overall transfer level: Needs assistance Equipment used: None Transfers: Sit to/from Stand Sit to Stand: Supervision         General transfer comment: supevision for safety  Ambulation/Gait Ambulation/Gait assistance: Min guard Ambulation Distance (Feet): 100 Feet (X2) Assistive device: None Gait Pattern/deviations: Step-through pattern Gait velocity: decreased   General Gait Details: Pt with mild instability during ambulation but no gross loss of balance. Recommended trial of rw for improved stability but pt refused using any assistive device.    Stairs             Wheelchair Mobility    Modified Rankin (Stroke Patients Only)       Balance Overall balance assessment: Needs assistance Sitting-balance support: No upper extremity supported Sitting balance-Leahy Scale: Good     Standing balance support: During functional activity Standing balance-Leahy Scale: Fair Standing balance comment: mild instability with ambulation                    Cognition Arousal/Alertness: Awake/alert Behavior During Therapy: WFL for tasks assessed/performed Overall Cognitive Status: Within Functional Limits for tasks assessed                      Exercises      General Comments General comments (skin integrity, edema, etc.): SpO2 90% at rest on 3.5L, incresed O2 to 4L during ambulation and SpO2 decreased to 85% with recovery to 90% upon sitting and pursed breathing. Second ambulation, O2 at 6L at 91% prior and decreasing to 88% upon return.       Pertinent Vitals/Pain Pain Assessment: No/denies pain    Home Living                      Prior Function            PT Goals (current goals can now be found in the care plan section) Acute Rehab PT Goals Patient Stated Goal: Get home PT Goal Formulation: With patient/family Time For Goal Achievement: 08/21/16 Potential to Achieve Goals: Good Progress towards PT goals: Progressing  toward goals    Frequency    Min 3X/week      PT Plan Current plan remains appropriate    Co-evaluation             End of Session Equipment Utilized During Treatment: Gait belt;Oxygen Activity Tolerance: Patient limited by fatigue Patient left: in chair;with call bell/phone within reach;with chair alarm set     Time: 2393-5940 PT Time Calculation (min) (ACUTE ONLY): 34 min  Charges:  $Gait Training: 23-37 mins                    G Codes:      Cassell Clement, PT, CSCS Pager [phone] Office [phone]  08/09/2016, 10:57 AM

## 2016-08-11 ENCOUNTER — Other Ambulatory Visit: Payer: Self-pay | Admitting: Family Medicine

## 2016-08-11 LAB — CULTURE, BLOOD (ROUTINE X 2)
CULTURE: NO GROWTH
Culture: NO GROWTH

## 2016-08-12 ENCOUNTER — Encounter: Payer: Self-pay | Admitting: Family Medicine

## 2016-08-12 ENCOUNTER — Ambulatory Visit (INDEPENDENT_AMBULATORY_CARE_PROVIDER_SITE_OTHER): Payer: Medicare Other | Admitting: Family Medicine

## 2016-08-12 VITALS — BP 140/68 | HR 80 | Temp 97.4°F | Resp 22 | Ht 66.5 in | Wt 130.0 lb

## 2016-08-12 DIAGNOSIS — J181 Lobar pneumonia, unspecified organism: Secondary | ICD-10-CM | POA: Diagnosis not present

## 2016-08-12 DIAGNOSIS — Z09 Encounter for follow-up examination after completed treatment for conditions other than malignant neoplasm: Secondary | ICD-10-CM

## 2016-08-12 DIAGNOSIS — J189 Pneumonia, unspecified organism: Secondary | ICD-10-CM

## 2016-08-12 MED ORDER — HYDROCODONE-HOMATROPINE 5-1.5 MG/5ML PO SYRP: 5.0000 mL | ORAL_SOLUTION | Freq: Three times a day (TID) | ORAL | 0 refills | Status: DC | PRN

## 2016-08-12 MED ORDER — AMOXICILLIN 500 MG PO TABS: 1000.0000 mg | ORAL_TABLET | Freq: Three times a day (TID) | ORAL | 0 refills | Status: DC

## 2016-08-12 NOTE — Progress Notes (Signed)
Subjective:    Patient ID: Bruce Travis, male    DOB: 1935-01-22, 81 y.o.   MRN: [id number]  HPI Please see the last office note. She was admitted to hospital with left lower lobe pneumonia. He was treated with ceftriaxone and azithromycin for 4 days. He was then transitioned to amoxicillin 1 g by mouth 3 times a day to complete 7 days of therapy. He feels much better today. On examination today his left basilar crackles have completely resolved. He is no longer wheezing. His air exchange has improved. He feels much better and is happy to be home from the hospital Past Medical History:  Diagnosis Date  . Allergic rhinitis   . Bronchiectasis   . CAD (coronary artery disease)   . Celiac disease   . Chronic heart failure ([place])   . Chronic kidney disease (CKD), stage III (moderate)   . COPD (chronic obstructive pulmonary disease) ([place])   . Diverticulosis of colon (without mention of hemorrhage)   . Esophageal reflux   . Family history of adverse reaction to anesthesia    daughter gets PONV  . Family hx of colon cancer   . History of stomach ulcers 1980s  . Hyperlipemia   . Hypertension   . 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 Myocardial infarction    "previous MI/echo in 12/2015"  . Nephrolithiasis    "got them now; never had OR/scopes" (02/03/2016)  . On home oxygen therapy    "2L usually; 3L last 3-4 days; 24/7" (08/06/2016)  . Other diseases of lung, not elsewhere classified   . Personal history of colonic polyps 04/26/2007   hyperplastic   . Pneumonia "several times"   Past Surgical History:  Procedure Laterality Date  . CARDIAC CATHETERIZATION  02/03/2016  . CARDIAC CATHETERIZATION  09/30/2009   non-obstructive CAD w/30% narrowing in prox LAD (Dr. Waunita Schooner)  . CARDIAC CATHETERIZATION  05/04/2001   same at 2011 cath (Dr. Waunita Schooner)  . CARDIAC CATHETERIZATION N/A 02/03/2016   Procedure: Right/Left Heart Cath and Coronary Angiography;  Surgeon: Pixie Casino, MD;  Location: [facility];  Service: Cardiovascular;  Laterality: N/A;  . EPIGLOTOPLASTY W/ MLB     removal due to carcinoma  . EXCISIONAL HEMORRHOIDECTOMY  2000s  . HAND SURGERY Right 1958   d/t crush injury  . TONSILLECTOMY AND ADENOIDECTOMY    . ULTRASOUND GUIDANCE FOR VASCULAR ACCESS  02/03/2016   Procedure: Ultrasound Guidance For Vascular Access;  Surgeon: Pixie Casino, MD;  Location: [facility];  Service: Cardiovascular;;  . VASECTOMY     Current Outpatient Prescriptions on File Prior to Visit  Medication Sig Dispense Refill  . albuterol (PROAIR HFA) 108 (90 Base) MCG/ACT inhaler INHALE 2 PUFFS EVERY 4 HOURS AS NEEDED FOR SHORTNESS OF BREATH. 8.5 g 5  . AMBULATORY NON FORMULARY MEDICATION O2 2 Lpm continuous    . amiodarone (PACERONE) 200 MG tablet Take 0.5 tablets (100 mg total) by mouth daily. 30 tablet 6  . aspirin EC 81 MG tablet Take 81 mg by mouth daily.    Marland Kitchen azelastine (ASTELIN) 0.1 % nasal spray PLACE 2 SPRAYS INTO EACH NOSTRILS TWICE DAILY AS DIRECTED. 30 mL 0  . Calcium Carbonate-Vitamin D (CALTRATE 600+D) 600-400 MG-UNIT tablet Take 1 tablet by mouth daily.    . montelukast (SINGULAIR) 10 MG tablet TAKE 1 TABLET BY MOUTH DAILY. 90 tablet 0  . Multiple Vitamin (MULTIVITAMIN WITH MINERALS) TABS  tablet Take 1 tablet by mouth daily.    . pantoprazole (PROTONIX) 40 MG tablet TAKE ONE TABLET BY MOUTH DAILY. 30 tablet 0  . polyethylene glycol (MIRALAX / GLYCOLAX) packet Take 17 g by mouth daily as needed for mild constipation.     . sacubitril-valsartan (ENTRESTO) 49-51 MG Take 1 tablet by mouth 2 (two) times daily. 60 tablet 6  . SPIRIVA HANDIHALER 18 MCG inhalation capsule INHALE 1 CAPSULE ONCE DAILY AS DIRECTED 30 capsule 11  . spironolactone (ALDACTONE) 25 MG tablet Take 0.5 tablets (12.5 mg total) by mouth daily. 16 tablet 6  . SYMBICORT 160-4.5 MCG/ACT inhaler INHALE 2 PUFFS FIRST THING IN MORNING AND THEN ANOTHER 2 PUFFS ABOUT 12 HOURS LATER.  10.2 g 11   No current facility-administered medications on file prior to visit.    Allergies  Allergen Reactions  . Adhesive [Tape] Other (See Comments)    Band-aids - tears skin off  . Codeine Other (See Comments)    Thought head was going to blow off  . Gluten Meal Other (See Comments)    Celiac disease   Social History   Social History  . Marital status: Married    Spouse name: N/A  . Number of children: 3  . Years of education: N/A   Occupational History  . retired Retired    Psychologist, sport and exercise and Administrator   Social History Main Topics  . Smoking status: Former Smoker    Packs/day: 2.00    Years: 35.00    Types: Cigarettes    Quit date: 01/21/1989  . Smokeless tobacco: Former Systems developer    Types: Chew    Quit date: 08/23/2001  . Alcohol use No  . Drug use: No  . Sexual activity: Not on file   Other Topics Concern  . Not on file   Social History Narrative  . No narrative on file      Review of Systems  All other systems reviewed and are negative.      Objective:   Physical Exam  Constitutional: He appears well-developed and well-nourished.  Cardiovascular: Normal rate, regular rhythm and normal heart sounds.   Pulmonary/Chest: Effort normal. No respiratory distress. He has no wheezes. He has no rales.  Abdominal: Soft. Bowel sounds are normal.  Musculoskeletal: He exhibits no edema.  Vitals reviewed.         Assessment & Plan:  Hospital discharge follow-up  Community acquired pneumonia of left lower lobe of lung ([place]) - Plan: DG Chest 2 View  Clinically the patient's pneumonia has resolved. I will extend his amoxicillin for a total of 10 days of therapy given his past history of decompensation after only 7 days of antibiotics recheck chest x-ray in 4 weeks to ensure resolution.Marland Kitchen

## 2016-09-03 ENCOUNTER — Other Ambulatory Visit: Payer: Self-pay | Admitting: Family Medicine

## 2016-09-03 NOTE — Telephone Encounter (Signed)
rx filled per protocol  

## 2016-09-10 ENCOUNTER — Other Ambulatory Visit: Payer: Self-pay | Admitting: Family Medicine

## 2016-09-10 DIAGNOSIS — I5023 Acute on chronic systolic (congestive) heart failure: Secondary | ICD-10-CM

## 2016-09-19 ENCOUNTER — Emergency Department (HOSPITAL_COMMUNITY)
Admission: EM | Admit: 2016-09-19 | Discharge: 2016-09-19 | Disposition: A | Discharge status: Home / Self Care | Payer: Medicare Other | Attending: Emergency Medicine | Admitting: Emergency Medicine

## 2016-09-19 ENCOUNTER — Encounter (HOSPITAL_COMMUNITY): Payer: Self-pay | Admitting: Emergency Medicine

## 2016-09-19 DIAGNOSIS — I130 Hypertensive heart and chronic kidney disease with heart failure and stage 1 through stage 4 chronic kidney disease, or unspecified chronic kidney disease: Secondary | ICD-10-CM | POA: Insufficient documentation

## 2016-09-19 DIAGNOSIS — Y999 Unspecified external cause status: Secondary | ICD-10-CM | POA: Diagnosis not present

## 2016-09-19 DIAGNOSIS — Y929 Unspecified place or not applicable: Secondary | ICD-10-CM | POA: Insufficient documentation

## 2016-09-19 DIAGNOSIS — I2510 Atherosclerotic heart disease of native coronary artery without angina pectoris: Secondary | ICD-10-CM | POA: Insufficient documentation

## 2016-09-19 DIAGNOSIS — I509 Heart failure, unspecified: Secondary | ICD-10-CM | POA: Diagnosis not present

## 2016-09-19 DIAGNOSIS — N183 Chronic kidney disease, stage 3 (moderate): Secondary | ICD-10-CM | POA: Insufficient documentation

## 2016-09-19 DIAGNOSIS — J449 Chronic obstructive pulmonary disease, unspecified: Secondary | ICD-10-CM | POA: Diagnosis not present

## 2016-09-19 DIAGNOSIS — Z87891 Personal history of nicotine dependence: Secondary | ICD-10-CM | POA: Insufficient documentation

## 2016-09-19 DIAGNOSIS — Z8521 Personal history of malignant neoplasm of larynx: Secondary | ICD-10-CM | POA: Insufficient documentation

## 2016-09-19 DIAGNOSIS — Z79899 Other long term (current) drug therapy: Secondary | ICD-10-CM | POA: Diagnosis not present

## 2016-09-19 DIAGNOSIS — Y939 Activity, unspecified: Secondary | ICD-10-CM | POA: Diagnosis not present

## 2016-09-19 DIAGNOSIS — S51811A Laceration without foreign body of right forearm, initial encounter: Secondary | ICD-10-CM | POA: Insufficient documentation

## 2016-09-19 DIAGNOSIS — S51819A Laceration without foreign body of unspecified forearm, initial encounter: Secondary | ICD-10-CM

## 2016-09-19 DIAGNOSIS — Z7982 Long term (current) use of aspirin: Secondary | ICD-10-CM | POA: Diagnosis not present

## 2016-09-19 DIAGNOSIS — W208XXA Other cause of strike by thrown, projected or falling object, initial encounter: Secondary | ICD-10-CM | POA: Insufficient documentation

## 2016-09-19 MED ORDER — SILVER SULFADIAZINE 1 % EX CREA
TOPICAL_CREAM | Freq: Once | CUTANEOUS | Status: AC
  Administered 2016-09-19: via TOPICAL
  Filled 2016-09-19: qty 85

## 2016-09-19 NOTE — ED Notes (Signed)
Pt dressing applied with antibiotic cream

## 2016-09-19 NOTE — ED Provider Notes (Signed)
[facility] DEPT Provider Note   CSN: [id number] Arrival date & time: 09/19/16  2145  By signing my name below, I, Georgette Shell, attest that this documentation has been prepared under the direction and in the presence of Orpah Greek, MD. Electronically Signed: Georgette Shell, ED Scribe. 09/19/16. 11:30 PM.  History   Chief Complaint Chief Complaint  Patient presents with  . Extremity Laceration    HPI The history is provided by the patient. No language interpreter was used.   HPI Comments: Bruce Travis is a 82 y.o. male with h/o CHF, who presents to the Emergency Department complaining of a skin tear to the right arm onset just PTA. Pt reports that his portable oxygen bag slipped down his shoulder and rubbed his arm, resulting in a skin tear. Bleeding was controlled PTA. Pt takes 32m ASA daily. He denies any additional injuries. Pt denies focal numbness or any other associated symptoms.   Past Medical History:  Diagnosis Date  . Allergic rhinitis   . Bronchiectasis   . CAD (coronary artery disease)   . Celiac disease   . Chronic heart failure ([place]   . Chronic kidney disease (CKD), stage III (moderate)   . COPD (chronic obstructive pulmonary disease) ([facility]   . Diverticulosis of colon (without mention of hemorrhage)   . Esophageal reflux   . Family history of adverse reaction to anesthesia    daughter gets PONV  . Family hx of colon cancer   . History of stomach ulcers 1980s  . Hyperlipemia   . Hypertension   . Laryngeal cancer ([place]    "between vocal cords and epiglottis"  . Myocardial infarction    "previous MI/echo in 12/2015"  . Nephrolithiasis    "got them now; never had OR/scopes" (02/03/2016)  . On home oxygen therapy    "2L usually; 3L last 3-4 days; 24/7" (08/06/2016)  . Other diseases of lung, not elsewhere classified   . Personal history of colonic polyps 04/26/2007   hyperplastic   . Pneumonia "several times"    Patient Active Problem List   Diagnosis  Date Noted  . Community acquired pneumonia of left lower lobe of lung ([facility] 08/06/2016  . Heart failure ([facility] 02/03/2016  . Cardiomyopathy, ischemic 01/06/2016  . History of acute inferior wall MI 01/06/2016  . PVC's (premature ventricular contractions) 12/12/2015  . Insomnia 04/16/2015  . Nocturnal hypoxemia 03/06/2013  . COPD mixed type ([place] 06/22/2012  . Allergic rhinitis 01/10/2012  . Hypertension 01/10/2012  . Dysphagia 04/13/2011  . Hoarseness 04/02/2011  . Celiac disease 12/15/2010  . SINUSITIS, CHRONIC 09/10/2009  . ARTHUS PHENOMENON 10/05/2007  . PULMONARY NODULE 08/11/2007  . Chronic respiratory failure ([facility] 07/06/2007  . GERD 07/06/2007    Past Surgical History:  Procedure Laterality Date  . CARDIAC CATHETERIZATION  02/03/2016  . CARDIAC CATHETERIZATION  09/30/2009   non-obstructive CAD w/30% narrowing in prox LAD (Dr. BWaunita Schooner  . CARDIAC CATHETERIZATION  05/04/2001   same at 2011 cath (Dr. BWaunita Schooner  . CARDIAC CATHETERIZATION N/A 02/03/2016   Procedure: Right/Left Heart Cath and Coronary Angiography;  Surgeon: KPixie Casino MD;  [facility];  Service: Cardiovascular;  Laterality: N/A;  . EPIGLOTOPLASTY W/ MLB     removal due to carcinoma  . EXCISIONAL HEMORRHOIDECTOMY  2000s  . HAND SURGERY Right 1958   d/t crush injury  . TONSILLECTOMY AND ADENOIDECTOMY    . ULTRASOUND GUIDANCE FOR VASCULAR ACCESS  02/03/2016   Procedure: Ultrasound Guidance For  Vascular Access;  Surgeon: Pixie Casino, MD;  Location: [facility];  Service: Cardiovascular;;  . VASECTOMY         Home Medications    Prior to Admission medications   Medication Sig Start Date End Date Taking? Authorizing Provider  albuterol (PROAIR HFA) 108 (90 Base) MCG/ACT inhaler INHALE 2 PUFFS EVERY 4 HOURS AS NEEDED FOR SHORTNESS OF BREATH. 06/22/16   Susy Frizzle, MD  AMBULATORY NON FORMULARY MEDICATION O2 2 Lpm continuous    Historical Provider, MD  amiodarone  (PACERONE) 200 MG tablet Take 0.5 tablets (100 mg total) by mouth daily. 06/30/16   Jolaine Artist, MD  amoxicillin (AMOXIL) 500 MG tablet Take 2 tablets (1,000 mg total) by mouth 3 (three) times daily. 08/12/16   Susy Frizzle, MD  aspirin EC 81 MG tablet Take 81 mg by mouth daily.    Historical Provider, MD  azelastine (ASTELIN) 0.1 % nasal spray PLACE 2 SPRAYS INTO EACH NOSTRILS TWICE DAILY AS DIRECTED. 07/01/16   Susy Frizzle, MD  Calcium Carbonate-Vitamin D (CALTRATE 600+D) 600-400 MG-UNIT tablet Take 1 tablet by mouth daily.    Historical Provider, MD  HYDROcodone-homatropine (HYCODAN) 5-1.5 MG/5ML syrup Take 5 mLs by mouth every 8 (eight) hours as needed for cough. 08/12/16   Susy Frizzle, MD  montelukast (SINGULAIR) 10 MG tablet TAKE 1 TABLET BY MOUTH DAILY. 04/19/16   Susy Frizzle, MD  Multiple Vitamin (MULTIVITAMIN WITH MINERALS) TABS tablet Take 1 tablet by mouth daily.    Historical Provider, MD  pantoprazole (PROTONIX) 40 MG tablet TAKE ONE TABLET BY MOUTH DAILY. 08/11/16   Susy Frizzle, MD  polyethylene glycol [facility]) packet Take 17 g by mouth daily as needed for mild constipation.     Historical Provider, MD  predniSONE (DELTASONE) 10 MG tablet Taper dose 08/09/16   Historical Provider, MD  sacubitril-valsartan (ENTRESTO) 49-51 MG Take 1 tablet by mouth 2 (two) times daily. 04/02/16   Jolaine Artist, MD  SPIRIVA HANDIHALER 18 MCG inhalation capsule INHALE 1 CAPSULE ONCE DAILY AS DIRECTED 03/16/16   Susy Frizzle, MD  spironolactone (ALDACTONE) 25 MG tablet Take 0.5 tablets (12.5 mg total) by mouth daily. 02/06/16   Shirley Friar, PA-C  SYMBICORT 160-4.5 MCG/ACT inhaler INHALE 2 PUFFS FIRST THING IN MORNING AND THEN ANOTHER 2 PUFFS ABOUT 12 HOURS LATER. 09/03/16   Susy Frizzle, MD    Family History Family History  Problem Relation Age of Onset  . Heart disease Father   . Colon cancer Father   . Prostate cancer Father   . Stroke  Mother   . Endometrial cancer Sister   . Stroke Maternal Grandmother   . Cancer Paternal Grandmother     Social History Social History  Substance Use Topics  . Smoking status: Former Smoker    Packs/day: 2.00    Years: 35.00    Types: Cigarettes    Quit date: 01/21/1989  . Smokeless tobacco: Former Systems developer    Types: Chew    Quit date: 08/23/2001  . Alcohol use No      Allergies   Adhesive [tape]; Codeine; and Gluten meal   Review of Systems Review of Systems  Constitutional: Negative for fever.  Skin: Positive for wound.  Neurological: Negative for numbness.  All other systems reviewed and are negative.    Physical Exam Updated Vital Signs BP 142/86   Pulse 71   Temp 97.7 F (36.5 C) (Oral)  Resp 20   Ht 5' 6"  (1.676 m)   Wt 130 lb (59 kg)   SpO2 96%   BMI 20.98 kg/m   Physical Exam  Constitutional: He is oriented to person, place, and time. He appears well-developed and well-nourished. No distress.  HENT:  Head: Normocephalic and atraumatic.  Right Ear: Hearing normal.  Left Ear: Hearing normal.  Nose: Nose normal.  Mouth/Throat: Oropharynx is clear and moist and mucous membranes are normal.  Eyes: Conjunctivae and EOM are normal. Pupils are equal, round, and reactive to light.  Neck: Normal range of motion. Neck supple.  Cardiovascular: Regular rhythm, S1 normal and S2 normal.  Exam reveals no gallop and no friction rub.   No murmur heard. Pulmonary/Chest: Effort normal and breath sounds normal. No respiratory distress. He exhibits no tenderness.  Abdominal: Soft. Normal appearance and bowel sounds are normal. There is no hepatosplenomegaly. There is no tenderness. There is no rebound, no guarding, no tenderness at McBurney's point and negative Murphy's sign. No hernia.  Musculoskeletal: Normal range of motion.  Neurological: He is alert and oriented to person, place, and time. He has normal strength. No cranial nerve deficit or sensory deficit. Coordination  normal. GCS eye subscore is 4. GCS verbal subscore is 5. GCS motor subscore is 6.  Skin: Skin is warm, dry and intact. No rash noted. No cyanosis.  4 cm skin tear on the right upper lateral forearm. No active bleeding.  Psychiatric: He has a normal mood and affect. His speech is normal and behavior is normal. Thought content normal.  Nursing note and vitals reviewed.    ED Treatments / Results  DIAGNOSTIC STUDIES: Oxygen Saturation is 95% on RA, adequate by my interpretation.    COORDINATION OF CARE: 11:24 PM Discussed treatment plan with pt at bedside which includes wound care and pt agreed to plan.  Labs (all labs ordered are listed, but only abnormal results are displayed) Labs Reviewed - No data to display  EKG  EKG Interpretation None       Radiology No results found.  Procedures Procedures (including critical care time)  Medications Ordered in ED Medications  silver sulfADIAZINE (SILVADENE) 1 % cream (not administered)     Initial Impression / Assessment and Plan / ED Course  I have reviewed the triage vital signs and the nursing notes.  Pertinent labs & imaging results that were available during my care of the patient were reviewed by me and considered in my medical decision making (see chart for details).     Superficial skin tear noted. Wound care provided. Will continue silvadene dressings at home, follow up PCP for recheck. Tetanus is UTD.  Final Clinical Impressions(s) / ED Diagnoses   Final diagnoses:  Skin tear of forearm without complication, initial encounter    New Prescriptions New Prescriptions   No medications on file   I personally performed the services described in this documentation, which was scribed in my presence. The recorded information has been reviewed and is accurate.     Orpah Greek, MD 09/19/16 [phone]

## 2016-09-19 NOTE — ED Triage Notes (Addendum)
Pt presented w/ a skin tear to the right arm, bleeding is controled.  Not on blood thinners but takes an 72m ASA daily.  Pt reports that his portable O2 bag slipped down his shoulder and rubbed his arm resulting in the skin tear.

## 2016-09-20 ENCOUNTER — Encounter (HOSPITAL_COMMUNITY)
Admission: RE | Admit: 2016-09-20 | Discharge: 2016-09-20 | Disposition: A | Discharge status: Home / Self Care | Payer: Medicare Other | Source: Ambulatory Visit | Attending: Internal Medicine | Admitting: Internal Medicine

## 2016-09-20 ENCOUNTER — Encounter (HOSPITAL_COMMUNITY): Payer: Self-pay

## 2016-09-20 VITALS — BP 132/78 | HR 79 | Wt 125.9 lb

## 2016-09-20 DIAGNOSIS — I5023 Acute on chronic systolic (congestive) heart failure: Secondary | ICD-10-CM

## 2016-09-20 DIAGNOSIS — I5021 Acute systolic (congestive) heart failure: Secondary | ICD-10-CM | POA: Insufficient documentation

## 2016-09-20 NOTE — Progress Notes (Signed)
Bruce Travis 82 y.o. male Pulmonary Rehab Orientation Note Patient arrived today in Cardiac and Pulmonary Rehab for orientation to Pulmonary Rehab. He was transported from [employer] via wheel chair. He does carry portable oxygen. Per pt, he uses oxygen continuously. Color good, skin warm and dry. Patient is oriented to time and place. Patient's medical history, psychosocial health, and medications reviewed. Psychosocial assessment reveals pt lives with their spouse. Pt is currently retired. Pt hobbies include playing cards with his friends when he is able to do this. Pt reports his stress level is moderate. Areas of stress/anxiety include his Health.  He was admitted with pneumonia in December 2017 and has not regained his strength since then.    Pt does not exhibit  signs of depression.  PHQ2/9 score 0/0. Pt shows good  coping skills with positive outlook . Will continue to monitor psychosocial status to see if there is any changes that need to be addressed.   Physical assessment reveals heart rate is normal, breath sounds clear to auscultation, no wheezes, rales, or rhonchi. Grip strength equal, strong. Distal pulses 3+ bilateral posterior tibial pulses present without ankle edema. Patient reports he does take medications as prescribed. Patient states he follows a Low Sodium diet. He is trying to maintain his weight and not lose any more weight.. Patient's weight will be monitored closely. Demonstration and practice of PLB using pulse oximeter. Patient able to return demonstration satisfactorily. Safety and hand hygiene in the exercise area reviewed with patient. Patient voices understanding of the information reviewed. Department expectations discussed with patient and achievable goals were set. The patient shows enthusiasm about attending the program and we look forward to working with this nice gentleman. The patient is scheduled for a 6 min walk test on Thursday, September 23, 2016 @ 4 pm and to begin  exercise on Thursday, September 30, 2016 in the 1030 class.  [id number]

## 2016-09-21 ENCOUNTER — Ambulatory Visit (INDEPENDENT_AMBULATORY_CARE_PROVIDER_SITE_OTHER): Payer: Medicare Other | Admitting: Family Medicine

## 2016-09-21 VITALS — BP 146/74 | HR 88 | Temp 98.1°F | Resp 22 | Ht 66.5 in | Wt 131.0 lb

## 2016-09-21 DIAGNOSIS — S51811A Laceration without foreign body of right forearm, initial encounter: Secondary | ICD-10-CM | POA: Diagnosis not present

## 2016-09-21 NOTE — Progress Notes (Signed)
Subjective:    Patient ID: Bruce Travis, male    DOB: Jul 18, 1935, 82 y.o.   MRN: [id number]  HPI Recently, the patient suffered a skin tear when his oxygen tank slipped off his right shoulder and through the dorsum of his right forearm. He has a V-shaped skin tear on the dorsum of his right forearm. It is approximately 5 cm in length and 5 cm in width. There is no evidence of erythema or infection. The underlying wound is healthy and viable. He denies any pain. Past Medical History:  Diagnosis Date  . Allergic rhinitis   . Bronchiectasis   . CAD (coronary artery disease)   . Celiac disease   . Chronic heart failure ([place])   . Chronic kidney disease (CKD), stage III (moderate)   . COPD (chronic obstructive pulmonary disease) ([place])   . Diverticulosis of colon (without mention of hemorrhage)   . Esophageal reflux   . Family history of adverse reaction to anesthesia    daughter gets PONV  . Family hx of colon cancer   . History of stomach ulcers 1980s  . Hyperlipemia   . Hypertension   . 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 Myocardial infarction    "previous MI/echo in 12/2015"  . Nephrolithiasis    "got them now; never had OR/scopes" (02/03/2016)  . On home oxygen therapy    "2L usually; 3L last 3-4 days; 24/7" (08/06/2016)  . Other diseases of lung, not elsewhere classified   . Personal history of colonic polyps 04/26/2007   hyperplastic   . Pneumonia "several times"   Past Surgical History:  Procedure Laterality Date  . CARDIAC CATHETERIZATION  02/03/2016  . CARDIAC CATHETERIZATION  09/30/2009   non-obstructive CAD w/30% narrowing in prox LAD (Dr. Waunita Schooner)  . CARDIAC CATHETERIZATION  05/04/2001   same at 2011 cath (Dr. Waunita Schooner)  . CARDIAC CATHETERIZATION N/A 02/03/2016   Procedure: Right/Left Heart Cath and Coronary Angiography;  Surgeon: Pixie Casino, MD;  Location: [facility];  Service: Cardiovascular;  Laterality: N/A;  .  EPIGLOTOPLASTY W/ MLB     removal due to carcinoma  . EXCISIONAL HEMORRHOIDECTOMY  2000s  . HAND SURGERY Right 1958   d/t crush injury  . TONSILLECTOMY AND ADENOIDECTOMY    . ULTRASOUND GUIDANCE FOR VASCULAR ACCESS  02/03/2016   Procedure: Ultrasound Guidance For Vascular Access;  Surgeon: Pixie Casino, MD;  Location: [facility];  Service: Cardiovascular;;  . VASECTOMY     Current Outpatient Prescriptions on File Prior to Visit  Medication Sig Dispense Refill  . albuterol (PROAIR HFA) 108 (90 Base) MCG/ACT inhaler INHALE 2 PUFFS EVERY 4 HOURS AS NEEDED FOR SHORTNESS OF BREATH. 8.5 g 5  . AMBULATORY NON FORMULARY MEDICATION O2 2 Lpm continuous    . amiodarone (PACERONE) 200 MG tablet Take 0.5 tablets (100 mg total) by mouth daily. 30 tablet 6  . aspirin EC 81 MG tablet Take 81 mg by mouth daily.    Marland Kitchen azelastine (ASTELIN) 0.1 % nasal spray PLACE 2 SPRAYS INTO EACH NOSTRILS TWICE DAILY AS DIRECTED. 30 mL 0  . Calcium Carbonate-Vitamin D (CALTRATE 600+D) 600-400 MG-UNIT tablet Take 1 tablet by mouth daily.    Marland Kitchen HYDROcodone-homatropine (HYCODAN) 5-1.5 MG/5ML syrup Take 5 mLs by mouth every 8 (eight) hours as needed for cough. 120 mL 0  . montelukast (SINGULAIR) 10 MG tablet TAKE 1 TABLET BY MOUTH DAILY. 90 tablet 0  .  Multiple Vitamin (MULTIVITAMIN WITH MINERALS) TABS tablet Take 1 tablet by mouth daily.    . pantoprazole (PROTONIX) 40 MG tablet TAKE ONE TABLET BY MOUTH DAILY. 30 tablet 0  . polyethylene glycol (MIRALAX / GLYCOLAX) packet Take 17 g by mouth daily as needed for mild constipation.     . predniSONE (DELTASONE) 10 MG tablet Taper dose    . sacubitril-valsartan (ENTRESTO) 49-51 MG Take 1 tablet by mouth 2 (two) times daily. 60 tablet 6  . SPIRIVA HANDIHALER 18 MCG inhalation capsule INHALE 1 CAPSULE ONCE DAILY AS DIRECTED 30 capsule 11  . spironolactone (ALDACTONE) 25 MG tablet Take 0.5 tablets (12.5 mg total) by mouth daily. 16 tablet 6  . SYMBICORT 160-4.5 MCG/ACT  inhaler INHALE 2 PUFFS FIRST THING IN MORNING AND THEN ANOTHER 2 PUFFS ABOUT 12 HOURS LATER. 10.2 g 3   No current facility-administered medications on file prior to visit.    Allergies  Allergen Reactions  . Adhesive [Tape] Other (See Comments)    Band-aids - tears skin off  . Codeine Other (See Comments)    Thought head was going to blow off  . Gluten Meal Other (See Comments)    Celiac disease   Social History   Social History  . Marital status: Married    Spouse name: N/A  . Number of children: 3  . Years of education: N/A   Occupational History  . retired Retired    Psychologist, sport and exercise and Administrator   Social History Main Topics  . Smoking status: Former Smoker    Packs/day: 2.00    Years: 35.00    Types: Cigarettes    Quit date: 01/21/1989  . Smokeless tobacco: Former Systems developer    Types: Chew    Quit date: 08/23/2001  . Alcohol use No  . Drug use: No  . Sexual activity: Not on file   Other Topics Concern  . Not on file   Social History Narrative  . No narrative on file      Review of Systems  All other systems reviewed and are negative.      Objective:   Physical Exam  Cardiovascular: Normal rate and regular rhythm.   Pulmonary/Chest: Effort normal and breath sounds normal.  Musculoskeletal:       Arms: Vitals reviewed.  The V drawn on the dorsum of his right forearm is approximately 5 cm wide at the base and 5 cm long from the base to the pinnacle       Assessment & Plan:  Skin tear of right forearm without complication, initial encounter  The wound was covered with a Tegaderm. Recommend changing Tegaderm every 3-4 days until wound is healed. Recheck in one week if not completely healed. I did give the patient next to Tegaderm to take home with him so that he can perform the dressing change at home

## 2016-09-22 ENCOUNTER — Other Ambulatory Visit: Payer: Self-pay | Admitting: Family Medicine

## 2016-09-22 ENCOUNTER — Other Ambulatory Visit (HOSPITAL_COMMUNITY): Payer: Self-pay | Admitting: *Deleted

## 2016-09-22 ENCOUNTER — Encounter (HOSPITAL_COMMUNITY): Payer: Self-pay | Admitting: *Deleted

## 2016-09-22 MED ORDER — SPIRONOLACTONE 25 MG PO TABS: 12.5000 mg | ORAL_TABLET | Freq: Every day | ORAL | 6 refills | Status: DC

## 2016-09-23 ENCOUNTER — Encounter (HOSPITAL_COMMUNITY)
Admission: RE | Admit: 2016-09-23 | Discharge: 2016-09-23 | Disposition: A | Discharge status: Home / Self Care | Payer: Medicare Other | Source: Ambulatory Visit | Attending: Internal Medicine | Admitting: Internal Medicine

## 2016-09-23 DIAGNOSIS — I5021 Acute systolic (congestive) heart failure: Secondary | ICD-10-CM | POA: Diagnosis not present

## 2016-09-23 DIAGNOSIS — I5023 Acute on chronic systolic (congestive) heart failure: Secondary | ICD-10-CM

## 2016-09-23 NOTE — Progress Notes (Signed)
Pulmonary Individual Treatment Plan  Patient Details  Name: Bruce Travis MRN: [id number] Date of Birth: 06/14/1935 Referring Provider:   April Manson Pulmonary Rehab Walk Test from 09/23/2016 in [place]  Referring Provider  Dr. Nelda Marseille      Initial Encounter Date:  Flowsheet Row Pulmonary Rehab Walk Test from 09/23/2016 in [facility]  Date  09/23/16  Referring Provider  Dr. Nelda Marseille      Visit Diagnosis: Acute on chronic systolic heart failure ([place])  Acute systolic heart failure ([place])  Patient's Home Medications on Admission:   Current Outpatient Prescriptions:  .  albuterol (PROAIR HFA) 108 (90 Base) MCG/ACT inhaler, INHALE 2 PUFFS EVERY 4 HOURS AS NEEDED FOR SHORTNESS OF BREATH., Disp: 8.5 g, Rfl: 5 .  AMBULATORY NON FORMULARY MEDICATION, O2 2 Lpm continuous, Disp: , Rfl:  .  amiodarone (PACERONE) 200 MG tablet, Take 0.5 tablets (100 mg total) by mouth daily., Disp: 30 tablet, Rfl: 6 .  aspirin EC 81 MG tablet, Take 81 mg by mouth daily., Disp: , Rfl:  .  azelastine (ASTELIN) 0.1 % nasal spray, PLACE 2 SPRAYS INTO EACH NOSTRILS TWICE DAILY AS DIRECTED., Disp: 30 mL, Rfl: 0 .  Calcium Carbonate-Vitamin D (CALTRATE 600+D) 600-400 MG-UNIT tablet, Take 1 tablet by mouth daily., Disp: , Rfl:  .  HYDROcodone-homatropine (HYCODAN) 5-1.5 MG/5ML syrup, Take 5 mLs by mouth every 8 (eight) hours as needed for cough., Disp: 120 mL, Rfl: 0 .  montelukast (SINGULAIR) 10 MG tablet, TAKE 1 TABLET BY MOUTH DAILY., Disp: 90 tablet, Rfl: 0 .  Multiple Vitamin (MULTIVITAMIN WITH MINERALS) TABS tablet, Take 1 tablet by mouth daily., Disp: , Rfl:  .  pantoprazole (PROTONIX) 40 MG tablet, TAKE ONE TABLET BY MOUTH DAILY., Disp: 30 tablet, Rfl: 0 .  polyethylene glycol (MIRALAX / GLYCOLAX) packet, Take 17 g by mouth daily as needed for mild constipation. , Disp: , Rfl:  .  predniSONE (DELTASONE) 10 MG tablet, Taper dose, Disp: , Rfl:  .   sacubitril-valsartan (ENTRESTO) 49-51 MG, Take 1 tablet by mouth 2 (two) times daily., Disp: 60 tablet, Rfl: 6 .  SPIRIVA HANDIHALER 18 MCG inhalation capsule, INHALE 1 CAPSULE ONCE DAILY AS DIRECTED, Disp: 30 capsule, Rfl: 11 .  spironolactone (ALDACTONE) 25 MG tablet, Take 0.5 tablets (12.5 mg total) by mouth daily., Disp: 16 tablet, Rfl: 6 .  SYMBICORT 160-4.5 MCG/ACT inhaler, INHALE 2 PUFFS FIRST THING IN MORNING AND THEN ANOTHER 2 PUFFS ABOUT 12 HOURS LATER., Disp: 10.2 g, Rfl: 3  Past Medical History: Past Medical History:  Diagnosis Date  . Allergic rhinitis   . Bronchiectasis   . CAD (coronary artery disease)   . Celiac disease   . Chronic heart failure ([place])   . Chronic kidney disease (CKD), stage III (moderate)   . COPD (chronic obstructive pulmonary disease) ([place])   . Diverticulosis of colon (without mention of hemorrhage)   . Esophageal reflux   . Family history of adverse reaction to anesthesia    daughter gets PONV  . Family hx of colon cancer   . History of stomach ulcers 1980s  . Hyperlipemia   . Hypertension   . 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 Myocardial infarction    "previous MI/echo in 12/2015"  . Nephrolithiasis    "got them now; never had OR/scopes" (02/03/2016)  . On home oxygen therapy    "2L usually; 3L last 3-4 days; 24/7" (  08/06/2016)  . Other diseases of lung, not elsewhere classified   . Personal history of colonic polyps 04/26/2007   hyperplastic   . Pneumonia "several times"    Tobacco Use: History  Smoking Status  . Former Smoker  . Packs/day: 2.00  . Years: 35.00  . Types: Cigarettes  . Quit date: 01/21/1989  Smokeless Tobacco  . Former Systems developer  . Types: Chew  . Quit date: 08/23/2001    Labs: Recent Review Flowsheet Data    Labs for ITP Cardiac and Pulmonary Rehab Latest Ref Rng & Units 02/04/2016 02/05/2016 02/06/2016 03/09/2016 08/06/2016   Cholestrol 125 - 200 mg/dL - - - 208(H) -   LDLCALC <130 mg/dL - - -  132(H) -   HDL >=40 mg/dL - - - 33(L) -   Trlycerides <150 mg/dL - - - 217(H) -   Hemoglobin A1c 4.8 - 5.6 % - 5.6 - - -   PHART 7.350 - 7.450 - - - - 7.447   PCO2ART 32.0 - 48.0 mmHg - - - - 33.8   HCO3 20.0 - 28.0 mmol/L - - - - 23.3   TCO2 0 - 100 mmol/L - - - - 24   ACIDBASEDEF 0.0 - 2.0 mmol/L - - - - -   O2SAT % 67.0 80.1 72.0 - 93.0      Capillary Blood Glucose: No results found for: GLUCAP   ADL UCSD:     Pulmonary Assessment Scores    Row Name 07/01/16 1649 09/22/16 1221       ADL UCSD   ADL Phase Exit Entry    SOB Score total 71 45       Pulmonary Function Assessment:     Pulmonary Function Assessment - 09/20/16 1025      Breath   Bilateral Breath Sounds Clear   Shortness of Breath Limiting activity;Panic with Shortness of Breath;Fear of Shortness of Breath;Yes      Exercise Target Goals: Date: 09/23/16  Exercise Program Goal: Individual exercise prescription set with THRR, safety & activity barriers. Participant demonstrates ability to understand and report RPE using BORG scale, to self-measure pulse accurately, and to acknowledge the importance of the exercise prescription.  Exercise Prescription Goal: Starting with aerobic activity 30 plus minutes a day, 3 days per week for initial exercise prescription. Provide home exercise prescription and guidelines that participant acknowledges understanding prior to discharge.  Activity Barriers & Risk Stratification:     Activity Barriers & Cardiac Risk Stratification - 09/20/16 1019      Activity Barriers & Cardiac Risk Stratification   Activity Barriers None      6 Minute Walk:     6 Minute Walk    Row Name 07/06/16 1312 09/23/16 1657       6 Minute Walk   Phase Discharge Initial    Distance 1655 feet 1544 feet    Walk Time 6 minutes 6 minutes    # of Rest Breaks 0 0    MPH 3.13 2.92    METS 3.37 3.22    RPE 14 13    Perceived Dyspnea  3 2    Symptoms No  -    Resting HR 72 bpm 73 bpm     Resting BP 108/60 124/70    Max Ex. HR 99 bpm 99 bpm    Max Ex. BP 174/60 154/68    2 Minute Post BP 144/60  -      Interval HR   Baseline HR 72 73  1 Minute HR 96 65    2 Minute HR 96 78    3 Minute HR 81 83    4 Minute HR 81 88    5 Minute HR 90 93    6 Minute HR 99 99    2 Minute Post HR 85 85    Interval Heart Rate? Yes Yes      Interval Oxygen   Interval Oxygen? Yes Yes    Baseline Oxygen Saturation % 93 % 92 %    Baseline Liters of Oxygen 4 L 4 L    1 Minute Oxygen Saturation % 91 % 92 %    1 Minute Liters of Oxygen 4 L 4 L    2 Minute Oxygen Saturation % 91 % 91 %    2 Minute Liters of Oxygen 4 L 4 L    3 Minute Oxygen Saturation % 86 % 90 %    3 Minute Liters of Oxygen 4 L 4 L    4 Minute Oxygen Saturation % 86 % 88 %    4 Minute Liters of Oxygen 6 L 4 L    5 Minute Oxygen Saturation % 85 % 87 %    5 Minute Liters of Oxygen 6 L 4 L    6 Minute Oxygen Saturation % 85 % 86 %    6 Minute Liters of Oxygen 6 L 6 L    2 Minute Post Oxygen Saturation % 92 % 91 %    2 Minute Post Liters of Oxygen 6 L 6 L       Initial Exercise Prescription:     Initial Exercise Prescription - 09/23/16 1600      Date of Initial Exercise RX and Referring Provider   Date 09/23/16   Referring Provider Dr. Nelda Marseille     Oxygen   Oxygen Continuous   Liters 6     Bike   Level 0.5   Minutes 17     NuStep   Level 4   Minutes 17   METs 2     Track   Laps 10   Minutes 17     Prescription Details   Frequency (times per week) 2   Duration Progress to 45 minutes of aerobic exercise without signs/symptoms of physical distress     Intensity   THRR 40-80% of Max Heartrate 56-113   Ratings of Perceived Exertion 11-13   Perceived Dyspnea 0-4     Progression   Progression Continue progressive overload as per policy without signs/symptoms or physical distress.     Resistance Training   Training Prescription Yes   Weight orange bands   Reps 10-12      Perform Capillary  Blood Glucose checks as needed.  Exercise Prescription Changes:     Exercise Prescription Changes    Row Name 04/01/16 1200 04/06/16 1200 04/13/16 1200 04/15/16 1200 04/20/16 1217     Exercise Review   Progression  -  - Yes Yes Yes     Response to Exercise   Blood Pressure (Admit) 146/84 122/60 150/68 118/60 132/70   Blood Pressure (Exercise) 150/70 142/90 142/86 132/74 98/60   Blood Pressure (Exit) 140/66 118/60 110/70 142/82 122/64   Heart Rate (Admit) 66 bpm 65 bpm 66 bpm 64 bpm 65 bpm   Heart Rate (Exercise) 89 bpm 69 bpm 83 bpm 78 bpm 78 bpm   Heart Rate (Exit) 67 bpm 71 bpm 74 bpm 63 bpm 67 bpm   Oxygen Saturation (Admit) 94 %  93 % 94 % 93 % 94 %   Oxygen Saturation (Exercise) 89 % 88 % 86 % 88 % 87 %   Oxygen Saturation (Exit) 95 % 94 % 93 % 95 % 93 %   Rating of Perceived Exertion (Exercise) 13 13 15 13 15    Perceived Dyspnea (Exercise) 1 2 2 1 3    Duration Progress to 45 minutes of aerobic exercise without signs/symptoms of physical distress Progress to 45 minutes of aerobic exercise without signs/symptoms of physical distress Progress to 45 minutes of aerobic exercise without signs/symptoms of physical distress Progress to 45 minutes of aerobic exercise without signs/symptoms of physical distress Progress to 45 minutes of aerobic exercise without signs/symptoms of physical distress   Intensity THRR unchanged THRR unchanged THRR unchanged THRR unchanged THRR unchanged     Progression   Progression Continue progressive overload as per policy without signs/symptoms or physical distress. Continue progressive overload as per policy without signs/symptoms or physical distress. Continue progressive overload as per policy without signs/symptoms or physical distress. Continue to progress workloads to maintain intensity without signs/symptoms of physical distress. Continue to progress workloads to maintain intensity without signs/symptoms of physical distress.     Resistance Training    Training Prescription Yes Yes Yes Yes Yes   Weight orange bands orange bands orange bands orange bands orange bands   Reps 10-12 10-12 10-12 10-12  10 minutes of strength training 10-12  10 minutes of strength training     Interval Training   Interval Training No No No No No     Oxygen   Oxygen Continuous Continuous Continuous Continuous Continuous   Liters 3 3 3 3 3      Bike   Level 0.5 2 3   - 3   Minutes 17 17 17   - 17     NuStep   Level  - 1 2 3 4    Minutes  - 17 17 17 17    METs  - 2.8 1.8 2.2 2.4     Track   Laps 4 12 14 15 15    Minutes 17 17 17 17 17      Home Exercise Plan   Plans to continue exercise at  -  -  - Home Home   Frequency  -  -  - Add 3 additional days to program exercise sessions. Add 3 additional days to program exercise sessions.   [place] Name 04/22/16 1200 04/27/16 1246 04/29/16 1200 05/04/16 1200 05/06/16 1200     Response to Exercise   Blood Pressure (Admit) 126/60 118/62 98/50 110/58 100/60   Blood Pressure (Exercise) 136/70 126/60 128/70 120/60 126/80   Blood Pressure (Exit) 134/74 124/70 100/60 118/60 126/72   Heart Rate (Admit) 61 bpm 65 bpm 61 bpm 64 bpm 61 bpm   Heart Rate (Exercise) 79 bpm 78 bpm 73 bpm 84 bpm 91 bpm   Heart Rate (Exit) 66 bpm 64 bpm 69 bpm 70 bpm 68 bpm   Oxygen Saturation (Admit) 93 % 94 % 95 % 93 % 95 %   Oxygen Saturation (Exercise) 89 % 87 % 85 % 88 % 91 %   Oxygen Saturation (Exit) 95 % 96 % 96 % 93 % 92 %   Rating of Perceived Exertion (Exercise) 13 13 13 15 13    Perceived Dyspnea (Exercise) 0 1 2 2 2    Duration Progress to 45 minutes of aerobic exercise without signs/symptoms of physical distress Progress to 45 minutes of aerobic exercise without signs/symptoms of physical distress Progress  to 45 minutes of aerobic exercise without signs/symptoms of physical distress Progress to 45 minutes of aerobic exercise without signs/symptoms of physical distress Progress to 45 minutes of aerobic exercise without signs/symptoms of  physical distress   Intensity THRR unchanged THRR unchanged THRR unchanged THRR unchanged THRR unchanged     Progression   Progression Continue to progress workloads to maintain intensity without signs/symptoms of physical distress. Continue to progress workloads to maintain intensity without signs/symptoms of physical distress. Continue to progress workloads to maintain intensity without signs/symptoms of physical distress. Continue to progress workloads to maintain intensity without signs/symptoms of physical distress. Continue to progress workloads to maintain intensity without signs/symptoms of physical distress.     Resistance Training   Training Prescription Yes Yes Yes Yes Yes   Weight orange bands orange bands orange bands orange bands orange bands   Reps 10-12  10 minutes of strength training 10-12  10 minutes of strength training 10-12  10 minutes of strength training 10-12  10 minutes of strength training 10-12  10 minutes of strength training     Interval Training   Interval Training No No No No No     Oxygen   Oxygen Continuous Continuous Continuous Continuous Continuous   Liters 3 3 3 3   -     Recumbant Bike   Level 3 3 3 3 3    Minutes 14 17 17 17 17      NuStep   Level 4 4 4 4 4    Minutes 17 17 17 17 17    METs 2.4 2.5 2.5 2.8 2.3     Track   Laps  - 14 15 12   -   Minutes  - 17 17 17   -     Home Exercise Plan   Plans to continue exercise at [place] 3 additional days to program exercise sessions.  -  -  -  -   Row Name 05/11/16 1200 05/13/16 1300 05/18/16 1200 05/25/16 1200 05/27/16 1200     Exercise Review   Progression  -  -  - Yes  -     Response to Exercise   Blood Pressure (Admit) 120/60 126/70 110/56 122/70 106/54   Blood Pressure (Exercise) 150/70  - 122/66 106/66 126/64   Blood Pressure (Exit) 122/62 118/62 106/60 130/80 120/70   Heart Rate (Admit) 67 bpm 65 bpm 59 bpm 63 bpm 61 bpm   Heart Rate (Exercise) 78 bpm 70 bpm 86  bpm 82 bpm 63 bpm   Heart Rate (Exit) 65 bpm 59 bpm 62 bpm 64 bpm 77 bpm   Oxygen Saturation (Admit) 92 % 94 % 94 % 93 % 96 %   Oxygen Saturation (Exercise) 90 % 93 % 89 % 88 % 91 %   Oxygen Saturation (Exit) 95 % 97 % 93 % 94 % 96 %   Rating of Perceived Exertion (Exercise) 13 13 13 13 13    Perceived Dyspnea (Exercise) 2 2 2 2 1    Duration Progress to 45 minutes of aerobic exercise without signs/symptoms of physical distress Progress to 45 minutes of aerobic exercise without signs/symptoms of physical distress Progress to 45 minutes of aerobic exercise without signs/symptoms of physical distress Progress to 45 minutes of aerobic exercise without signs/symptoms of physical distress Progress to 45 minutes of aerobic exercise without signs/symptoms of physical distress   Intensity THRR unchanged THRR unchanged THRR unchanged THRR unchanged THRR unchanged  Progression   Progression Continue to progress workloads to maintain intensity without signs/symptoms of physical distress. Continue to progress workloads to maintain intensity without signs/symptoms of physical distress. Continue to progress workloads to maintain intensity without signs/symptoms of physical distress. Continue to progress workloads to maintain intensity without signs/symptoms of physical distress. Continue to progress workloads to maintain intensity without signs/symptoms of physical distress.     Resistance Training   Training Prescription Yes Yes Yes Yes Yes   Weight orange bands orange bands orange bands orange bands orange bands   Reps 10-12  10 minutes of strength training 10-12  10 minutes of strength training 10-12  10 minutes of strength training 10-12  10 minutes of strength training 10-12  10 minutes of strength training     Interval Training   Interval Training No No No No No     Oxygen   Oxygen Continuous Continuous Continuous  - Continuous   Liters 3 3 3   - 3     Recumbant Bike   Level 3 3 3 4 4     Minutes 17 17 17 17 17      NuStep   Level 4 4 4 4 4    Minutes 17 17 17 17 17    METs 2.4 2.4 2.2 2.2 2.4     Track   Laps 16  - 14 16  -   Minutes 17  - 17 17  -   Row Name 06/01/16 1217 06/03/16 1200 06/08/16 1200 06/10/16 1200 06/15/16 1200     Exercise Review   Progression  -  -  -  - Yes     Response to Exercise   Blood Pressure (Admit) 102/64 100/60 110/56 110/60 120/70   Blood Pressure (Exercise) 126/60 130/66 120/60 148/60 126/70   Blood Pressure (Exit) 108/56 118/64 106/60 114/62 118/68   Heart Rate (Admit) 63 bpm 54 bpm 66 bpm 63 bpm 60 bpm   Heart Rate (Exercise) 86 bpm 69 bpm 76 bpm 74 bpm 81 bpm   Heart Rate (Exit) 61 bpm 67 bpm 67 bpm 58 bpm 70 bpm   Oxygen Saturation (Admit) 91 % 95 % 95 % 94 % 94 %   Oxygen Saturation (Exercise) 87 % 86 % 88 % 90 % 88 %   Oxygen Saturation (Exit) 94 % 96 % 94 % 97 % 93 %   Rating of Perceived Exertion (Exercise) 15 13 13 13 13    Perceived Dyspnea (Exercise) 3 3 1 2 2    Duration Progress to 45 minutes of aerobic exercise without signs/symptoms of physical distress Progress to 45 minutes of aerobic exercise without signs/symptoms of physical distress Progress to 45 minutes of aerobic exercise without signs/symptoms of physical distress Progress to 45 minutes of aerobic exercise without signs/symptoms of physical distress Progress to 45 minutes of aerobic exercise without signs/symptoms of physical distress   Intensity THRR unchanged THRR unchanged THRR unchanged THRR unchanged THRR unchanged     Progression   Progression Continue to progress workloads to maintain intensity without signs/symptoms of physical distress. Continue to progress workloads to maintain intensity without signs/symptoms of physical distress. Continue to progress workloads to maintain intensity without signs/symptoms of physical distress. Continue to progress workloads to maintain intensity without signs/symptoms of physical distress. Continue to progress workloads to  maintain intensity without signs/symptoms of physical distress.     Resistance Training   Training Prescription Yes Yes Yes Yes Yes   Weight orange bands orange bands orange bands orange bands orange bands  Reps 10-12  10 minutes of strength training 10-12  10 minutes of strength training 10-12  10 minutes of strength training 10-12  10 minutes of strength training 10-12  10 minutes of strength training     Interval Training   Interval Training No No No No No     Oxygen   Oxygen Continuous Continuous  desaturating on track increased to 4 L Continuous Continuous Continuous   Liters 3 4 4 4 4      Recumbant Bike   Level 4  - 4 4 4    Minutes 17  - 17 17 17      NuStep   Level 4 4 4   - 5   Minutes 17 17 17   - 17   METs 2.2 2 2.3  - 2.3     Track   Laps 16 15 18 16 17    Minutes 17 17 17 17 17    Row Name 06/22/16 1200 06/29/16 1200 07/01/16 1300         Exercise Review   Progression  - Yes  -       Response to Exercise   Blood Pressure (Admit) 127/74 130/64 104/56     Blood Pressure (Exercise) 140/58 140/64 136/76     Blood Pressure (Exit) 96/60 126/64 110/64     Heart Rate (Admit) 66 bpm 72 bpm 70 bpm     Heart Rate (Exercise) 78 bpm 81 bpm 78 bpm     Heart Rate (Exit) 67 bpm 73 bpm 63 bpm     Oxygen Saturation (Admit) 92 % 92 % 93 %     Oxygen Saturation (Exercise) 83 %  sat increased to 88% with rest 87 %  sat increased to 88% with rest 88 %  sat increased to 88% with rest     Oxygen Saturation (Exit) 93 % 97 % 95 %     Rating of Perceived Exertion (Exercise) 13 13 13      Perceived Dyspnea (Exercise) 3 2 2      Duration Progress to 45 minutes of aerobic exercise without signs/symptoms of physical distress Progress to 45 minutes of aerobic exercise without signs/symptoms of physical distress Progress to 45 minutes of aerobic exercise without signs/symptoms of physical distress     Intensity THRR unchanged THRR unchanged THRR unchanged       Progression    Progression Continue to progress workloads to maintain intensity without signs/symptoms of physical distress. Continue to progress workloads to maintain intensity without signs/symptoms of physical distress. Continue to progress workloads to maintain intensity without signs/symptoms of physical distress.       Resistance Training   Training Prescription Yes Yes Yes     Weight orange bands orange bands orange bands     Reps 10-12  10 minutes of strength training 10-12  10 minutes of strength training 10-12  10 minutes of strength training       Interval Training   Interval Training No No No       Oxygen   Oxygen Continuous Continuous Continuous     Liters 4 4 4        Recumbant Bike   Level 4 5  -     Minutes 17 17  -       NuStep   Level 5 5 5      Minutes 17 17 17      METs 2.4 2.3 2.5       Track   Laps 15 16 15  Minutes 17 17 17         Exercise Comments:     Exercise Comments    Row Name 04/06/16 0753 04/15/16 1328 05/03/16 1151 05/31/16 0938 06/22/16 1226   Exercise Comments Patient has only attended one exercise session. Will cont. to monitor.  Home exercise completed today Patient is steadily progressing in workload intensities. He is up to 15 laps on the walking track. Will cont. to monitor.  Patient consistently works hard and is open to workload progression. Will cont. to monitor.  Patient will be graduating on 07/01/16 from rehab. He is going to look into different options for his post grad exercise between now and the 9th. He was offered our maintenance program. Will follow up.   [place] Name 06/29/16 0720 07/06/16 1320         Exercise Comments Patient is doing well in program. Will be graduating 07/06/16. Will discuss post grad exercise plans.  Today was Prakash's last day in program. Patient is going to cont. to exercise at the [facility] near his home.         Discharge Exercise Prescription (Final Exercise Prescription Changes):     Exercise Prescription Changes -  07/01/16 1300      Response to Exercise   Blood Pressure (Admit) 104/56   Blood Pressure (Exercise) 136/76   Blood Pressure (Exit) 110/64   Heart Rate (Admit) 70 bpm   Heart Rate (Exercise) 78 bpm   Heart Rate (Exit) 63 bpm   Oxygen Saturation (Admit) 93 %   Oxygen Saturation (Exercise) 88 %  sat increased to 88% with rest   Oxygen Saturation (Exit) 95 %   Rating of Perceived Exertion (Exercise) 13   Perceived Dyspnea (Exercise) 2   Duration Progress to 45 minutes of aerobic exercise without signs/symptoms of physical distress   Intensity THRR unchanged     Progression   Progression Continue to progress workloads to maintain intensity without signs/symptoms of physical distress.     Resistance Training   Training Prescription Yes   Weight orange bands   Reps 10-12  10 minutes of strength training     Interval Training   Interval Training No     Oxygen   Oxygen Continuous   Liters 4     NuStep   Level 5   Minutes 17   METs 2.5     Track   Laps 15   Minutes 17       Nutrition:  Target Goals: Understanding of nutrition guidelines, daily intake of sodium <1563m, cholesterol <2070m calories 30% from fat and 7% or less from saturated fats, daily to have 5 or more servings of fruits and vegetables.  Biometrics:     Pre Biometrics - 09/20/16 1029      Pre Biometrics   Grip Strength 33 kg       Nutrition Therapy Plan and Nutrition Goals:     Nutrition Therapy & Goals - 09/22/16 1647      Nutrition Therapy   Diet Gluten Free, High Calorie, High Protein, Low Sodium     Personal Nutrition Goals   Personal Goal #1 1-2 lb wt gain per week to a wt gain goal of 6-24 lb     Intervention Plan   Intervention Prescribe, educate and counsel regarding individualized specific dietary modifications aiming towards targeted core components such as weight, hypertension, lipid management, diabetes, heart failure and other comorbidities.   Expected Outcomes Short Term  Goal: Understand basic principles of dietary content, such  as calories, fat, sodium, cholesterol and nutrients.;Long Term Goal: Adherence to prescribed nutrition plan.      Nutrition Discharge: Rate Your Plate Scores:     Nutrition Assessments - 09/22/16 1311      Rate Your Plate Scores   Pre Score --  Updated Rate Your Plate returned but is incomplete      Psychosocial: Target Goals: Acknowledge presence or absence of depression, maximize coping skills, provide positive support system. Participant is able to verbalize types and ability to use techniques and skills needed for reducing stress and depression.  Initial Review & Psychosocial Screening:     Initial Psych Review & Screening - 09/20/16 1032      Initial Review   Current issues with --  none identified     Family Dynamics   Good Support System? Yes     Barriers   Psychosocial barriers to participate in program There are no identifiable barriers or psychosocial needs.     Screening Interventions   Interventions Encouraged to exercise      Quality of Life Scores:     Quality of Life - 09/22/16 1221      Quality of Life Scores   Health/Function Pre 19.3 %   Socioeconomic Pre 24.56 %   Psych/Spiritual Pre 22.79 %   Family Pre 19.25 %   GLOBAL Pre 21.25 %      PHQ-9: Recent Review Flowsheet Data    Depression screen [facility] 2/9 09/20/2016 07/06/2016 03/22/2016 04/16/2015 10/22/2013   Decreased Interest 0 0 0 0 0   Down, Depressed, Hopeless 0 0 0 0 0   PHQ - 2 Score 0 0 0 0 0      Psychosocial Evaluation and Intervention:     Psychosocial Evaluation - 09/20/16 1033      Psychosocial Evaluation & Interventions   Continued Psychosocial Services Needed No      Psychosocial Re-Evaluation:     Psychosocial Re-Evaluation    Row Name 04/05/16 0910 05/04/16 0828 06/01/16 0801 06/28/16 1622       Psychosocial Re-Evaluation   Interventions Encouraged to attend Pulmonary Rehabilitation for the exercise  Encouraged to attend Pulmonary Rehabilitation for the exercise Encouraged to attend Pulmonary Rehabilitation for the exercise Encouraged to attend Pulmonary Rehabilitation for the exercise    Comments no psycosocial barriers identified at this time no psycosocial barriers identified in the past 30 days no psycosocial barriers identified in the past 30 days no psycosocial barriers identified in the past 30 days      Education: Education Goals: Education classes will be provided on a weekly basis, covering required topics. Participant will state understanding/return demonstration of topics presented.  Learning Barriers/Preferences:     Learning Barriers/Preferences - 09/20/16 1023      Learning Barriers/Preferences   Learning Barriers None   Learning Preferences Written Material;Video;Verbal Instruction;Individual Instruction;Group Instruction;Pictoral      Education Topics: Risk Factor Reduction:  -Group instruction that is supported by a PowerPoint presentation. Instructor discusses the definition of a risk factor, different risk factors for pulmonary disease, and how the heart and lungs work together.     Nutrition for Pulmonary Patient:  -Group instruction provided by PowerPoint slides, verbal discussion, and written materials to support subject matter. The instructor gives an explanation and review of healthy diet recommendations, which includes a discussion on weight management, recommendations for fruit and vegetable consumption, as well as protein, fluid, caffeine, fiber, sodium, sugar, and alcohol. Tips for eating when patients are short of breath are  discussed. Flowsheet Row PULMONARY REHAB OTHER RESPIRATORY from 07/01/2016 in [place]  Date  07/01/16 [facility] Eating During the [place]  Educator  RD  Instruction Review Code  2- meets goals/outcomes      Pursed Lip Breathing:  -Group instruction that is supported by demonstration and  informational handouts. Instructor discusses the benefits of pursed lip and diaphragmatic breathing and detailed demonstration on how to preform both.   Flowsheet Row PULMONARY REHAB OTHER RESPIRATORY from 07/01/2016 in [place]  Date  04/29/16  Educator  RT  Instruction Review Code  2- meets goals/outcomes      Oxygen Safety:  -Group instruction provided by PowerPoint, verbal discussion, and written material to support subject matter. There is an overview of "What is Oxygen" and "Why do we need it".  Instructor also reviews how to create a safe environment for oxygen use, the importance of using oxygen as prescribed, and the risks of noncompliance. There is a brief discussion on traveling with oxygen and resources the patient may utilize. Flowsheet Row PULMONARY REHAB OTHER RESPIRATORY from 07/01/2016 in [place]  Date  04/15/16  Educator  RN  Instruction Review Code  2- meets goals/outcomes      Oxygen Equipment:  -Group instruction provided by [facility] Staff utilizing handouts, written materials, and equipment demonstrations.   Signs and Symptoms:  -Group instruction provided by written material and verbal discussion to support subject matter. Warning signs and symptoms of infection, stroke, and heart attack are reviewed and when to call the physician/911 reinforced. Tips for preventing the spread of infection discussed.   Advanced Directives:  -Group instruction provided by verbal instruction and written material to support subject matter. Instructor reviews Advanced Directive laws and proper instruction for filling out document.   Pulmonary Video:  -Group video education that reviews the importance of medication and oxygen compliance, exercise, good nutrition, pulmonary hygiene, and pursed lip and diaphragmatic breathing for the pulmonary patient. Flowsheet Row PULMONARY REHAB OTHER RESPIRATORY from 07/01/2016 in  [place]  Date  04/22/16  Instruction Review Code  2- meets goals/outcomes      Exercise for the Pulmonary Patient:  -Group instruction that is supported by a PowerPoint presentation. Instructor discusses benefits of exercise, core components of exercise, frequency, duration, and intensity of an exercise routine, importance of utilizing pulse oximetry during exercise, safety while exercising, and options of places to exercise outside of rehab.     Pulmonary Medications:  -Verbally interactive group education provided by instructor with focus on inhaled medications and proper administration.   Anatomy and Physiology of the Respiratory System and Intimacy:  -Group instruction provided by PowerPoint, verbal discussion, and written material to support subject matter. Instructor reviews respiratory cycle and anatomical components of the respiratory system and their functions. Instructor also reviews differences in obstructive and restrictive respiratory diseases with examples of each. Intimacy, Sex, and Sexuality differences are reviewed with a discussion on how relationships can change when diagnosed with pulmonary disease. Common sexual concerns are reviewed.   Knowledge Questionnaire Score:     Knowledge Questionnaire Score - 09/22/16 1220      Knowledge Questionnaire Score   Pre Score 13/13      Core Components/Risk Factors/Patient Goals at Admission:     Personal Goals and Risk Factors at Admission - 09/20/16 1029      Core Components/Risk Factors/Patient Goals on Admission   Increase Strength and Stamina  Yes   Improve shortness of breath with ADL's Yes      Core Components/Risk Factors/Patient Goals Review:      Goals and Risk Factor Review    Row Name 04/05/16 0910 05/04/16 0827 06/01/16 0801 06/28/16 1622 09/20/16 1032     Core Components/Risk Factors/Patient Goals Review   Personal Goals Review Increase Strength and Stamina;Improve  shortness of breath with ADL's Increase Strength and Stamina;Improve shortness of breath with ADL's Increase Strength and Stamina;Improve shortness of breath with ADL's Increase Strength and Stamina;Improve shortness of breath with ADL's Increase Strength and Stamina;Improve shortness of breath with ADL's   Review Exercise will increase strength, stamina, and improve SOB see comments section on ITP see comments section on ITP see comments section on ITP  -   Expected Outcomes Exercise will increase strength, stamina, and improve SOB see admission expected outcomes see admission expected outcomes see admission expected outcomes  -      Core Components/Risk Factors/Patient Goals at Discharge (Final Review):      Goals and Risk Factor Review - 09/20/16 1032      Core Components/Risk Factors/Patient Goals Review   Personal Goals Review Increase Strength and Stamina;Improve shortness of breath with ADL's      ITP Comments:   Comments:

## 2016-09-30 ENCOUNTER — Encounter (HOSPITAL_COMMUNITY)
Admission: RE | Admit: 2016-09-30 | Discharge: 2016-09-30 | Disposition: A | Discharge status: Home / Self Care | Payer: Medicare Other | Source: Ambulatory Visit | Attending: Internal Medicine | Admitting: Internal Medicine

## 2016-09-30 DIAGNOSIS — I5023 Acute on chronic systolic (congestive) heart failure: Secondary | ICD-10-CM

## 2016-09-30 DIAGNOSIS — I5021 Acute systolic (congestive) heart failure: Secondary | ICD-10-CM | POA: Diagnosis not present

## 2016-09-30 NOTE — Progress Notes (Signed)
Daily Session Note  Patient Details  Name: Bruce Travis MRN: [id number] Date of Birth: [birth year] Referring Provider:   April Manson Pulmonary Rehab Walk Test from 09/23/2016 in [place]  Referring Provider  Dr. Nelda Marseille      Encounter Date: 09/30/2016  Check In:     Session Check In - 09/30/16 1036      Check-In   Location [facility]   Staff Present Rosebud Poles, RN, BSN;Ondrea Dow, MS, ACSM RCEP, Exercise Physiologist;Lisa Ysidro Evert, RN;Portia Rollene Rotunda, RN, BSN   Supervising physician immediately available to respond to emergencies [facility] immediately available   Physician(s) Dr. Algis Liming   Medication changes reported     No   Warm-up and Cool-down Performed as group-led instruction   Resistance Training Performed Yes   VAD Patient? No     Pain Assessment   Currently in Pain? No/denies   Multiple Pain Sites No      Capillary Blood Glucose: No results found for this or any previous visit (from the past 24 hour(s)).      Exercise Prescription Changes - 09/30/16 1200      Response to Exercise   Blood Pressure (Admit) 110/60   Blood Pressure (Exercise) 126/70   Blood Pressure (Exit) 116/76   Heart Rate (Admit) 69 bpm   Heart Rate (Exercise) 86 bpm   Heart Rate (Exit) 69 bpm   Oxygen Saturation (Admit) 96 %   Oxygen Saturation (Exercise) 93 %   Oxygen Saturation (Exit) 96 %   Rating of Perceived Exertion (Exercise) 13   Perceived Dyspnea (Exercise) 1   Duration Progress to 45 minutes of aerobic exercise without signs/symptoms of physical distress   Intensity THRR unchanged     Progression   Progression Continue to progress workloads to maintain intensity without signs/symptoms of physical distress.     Resistance Training   Training Prescription Yes   Weight orange bands   Reps 10-12  10 minutes of strength training     Interval Training   Interval Training No     Oxygen   Oxygen Continuous   Liters 4     Bike   Level 3  scifit   Minutes 17     NuStep   Level 4   Minutes 17   METs 2.3     Goals Met:  Exercise tolerated well No report of cardiac concerns or symptoms Strength training completed today  Goals Unmet:  Not Applicable  Comments: Service time is from 10:30am to 12:40p. Patient attended education today with Dr. Nelda Marseille.    Dr. Rush Farmer is Medical Director for Pulmonary Rehab at [facility].

## 2016-10-05 ENCOUNTER — Encounter (HOSPITAL_COMMUNITY)
Admission: RE | Admit: 2016-10-05 | Discharge: 2016-10-05 | Disposition: A | Discharge status: Home / Self Care | Payer: Medicare Other | Source: Ambulatory Visit | Attending: Internal Medicine | Admitting: Internal Medicine

## 2016-10-05 VITALS — Wt 127.2 lb

## 2016-10-05 DIAGNOSIS — I5021 Acute systolic (congestive) heart failure: Secondary | ICD-10-CM | POA: Diagnosis not present

## 2016-10-05 DIAGNOSIS — I5023 Acute on chronic systolic (congestive) heart failure: Secondary | ICD-10-CM

## 2016-10-05 NOTE — Progress Notes (Signed)
Daily Session Note  Patient Details  Name: Bruce Travis MRN: [id number] Date of Birth: [birth year] Referring Provider:   April Manson Pulmonary Rehab Walk Test from 09/23/2016 in [place]  Referring Provider  Dr. Nelda Marseille      Encounter Date: 10/05/2016  Check In:     Session Check In - 10/05/16 1030      Check-In   Location [facility]   Staff Present Rosebud Poles, RN, BSN;Molly diVincenzo, MS, ACSM RCEP, Exercise Physiologist;Lisa Ysidro Evert, RN;Portia Rollene Rotunda, RN, BSN   Supervising physician immediately available to respond to emergencies [facility] immediately available   Physician(s) Dr. Posey Pronto   Medication changes reported     No   Fall or balance concerns reported    No   Warm-up and Cool-down Performed as group-led instruction   Resistance Training Performed Yes   VAD Patient? No     Pain Assessment   Currently in Pain? No/denies   Multiple Pain Sites No      Capillary Blood Glucose: No results found for this or any previous visit (from the past 24 hour(s)).      Exercise Prescription Changes - 10/05/16 1200      Response to Exercise   Blood Pressure (Admit) 110/50   Blood Pressure (Exercise) 108/60   Blood Pressure (Exit) 104/60   Heart Rate (Admit) 69 bpm   Heart Rate (Exercise) 82 bpm   Heart Rate (Exit) 75 bpm   Oxygen Saturation (Admit) 96 %   Oxygen Saturation (Exercise) 88 %   Oxygen Saturation (Exit) 95 %   Rating of Perceived Exertion (Exercise) 14   Perceived Dyspnea (Exercise) 2   Duration Progress to 45 minutes of aerobic exercise without signs/symptoms of physical distress   Intensity THRR unchanged     Progression   Progression Continue to progress workloads to maintain intensity without signs/symptoms of physical distress.     Resistance Training   Training Prescription Yes   Weight orange bands   Reps 10-12  10 minutes of strength training     Interval Training   Interval Training No      Oxygen   Oxygen Continuous   Liters 3     Bike   Level 3  scifit   Minutes 17     NuStep   Level 4   Minutes 17   METs 2.3     Track   Laps 12   Minutes 17     Goals Met:  [place] with exercise equipment Using PLB without cueing & demonstrates good technique Exercise tolerated well No report of cardiac concerns or symptoms Strength training completed today  Goals Unmet:  Not Applicable  Comments: Service time is from 1030 to 1205    Dr. Rush Farmer is Medical Director for Pulmonary Rehab at [facility]

## 2016-10-07 ENCOUNTER — Encounter (HOSPITAL_COMMUNITY)
Admission: RE | Admit: 2016-10-07 | Discharge: 2016-10-07 | Disposition: A | Discharge status: Home / Self Care | Payer: Medicare Other | Source: Ambulatory Visit | Attending: Internal Medicine | Admitting: Internal Medicine

## 2016-10-07 DIAGNOSIS — I5021 Acute systolic (congestive) heart failure: Secondary | ICD-10-CM

## 2016-10-07 DIAGNOSIS — I5023 Acute on chronic systolic (congestive) heart failure: Secondary | ICD-10-CM

## 2016-10-07 NOTE — Progress Notes (Signed)
Daily Session Note  Patient Details  Name: Bruce Travis MRN: [id number] Date of Birth: 07/22/1935 Referring Provider:   April Manson Pulmonary Rehab Walk Test from 09/23/2016 in [place]  Referring Provider  Dr. Nelda Marseille      Encounter Date: 10/07/2016  Check In:     Session Check In - 10/07/16 1030      Check-In   [facility]   Staff Present Rosebud Poles, RN, BSN;Danaisha Celli, MS, ACSM RCEP, Exercise Physiologist;Lisa Ysidro Evert, RN;Portia Rollene Rotunda, RN, BSN   Supervising physician immediately available to respond to emergencies [facility] immediately available   Physician(s) Dr. Algis Liming   Medication changes reported     No   Warm-up and Cool-down Performed as group-led instruction   Resistance Training Performed Yes   VAD Patient? No     Pain Assessment   Currently in Pain? No/denies   Multiple Pain Sites No      Capillary Blood Glucose: No results found for this or any previous visit (from the past 24 hour(s)).      Exercise Prescription Changes - 10/07/16 1200      Response to Exercise   Blood Pressure (Admit) 120/70   Blood Pressure (Exercise) 140/70   Blood Pressure (Exit) 122/80   Heart Rate (Admit) 70 bpm   Heart Rate (Exercise) 72 bpm   Heart Rate (Exit) 65 bpm   Oxygen Saturation (Admit) 93 %   Oxygen Saturation (Exercise) 86 %   Oxygen Saturation (Exit) 93 %   Rating of Perceived Exertion (Exercise) 13   Perceived Dyspnea (Exercise) 2   Duration Progress to 45 minutes of aerobic exercise without signs/symptoms of physical distress   Intensity THRR unchanged     Progression   Progression Continue to progress workloads to maintain intensity without signs/symptoms of physical distress.     Resistance Training   Training Prescription Yes   Weight orange bands   Reps 10-12  10 minutes of strength training     Interval Training   Interval Training No     Oxygen   Oxygen Continuous   Liters 3     Bike   Level --  scifit   Minutes --     NuStep   Level 4   Minutes 17   METs 2.1     Track   Laps 13   Minutes 17     Goals Met:  Exercise tolerated well No report of cardiac concerns or symptoms Strength training completed today  Goals Unmet:  Not Applicable  Comments: Service time is from 10:30am to 12:00p    Dr. Rush Farmer is Medical Director for Pulmonary Rehab at [facility].

## 2016-10-08 ENCOUNTER — Other Ambulatory Visit (HOSPITAL_COMMUNITY): Payer: Self-pay | Admitting: Internal Medicine

## 2016-10-12 ENCOUNTER — Encounter (HOSPITAL_COMMUNITY)
Admission: RE | Admit: 2016-10-12 | Discharge: 2016-10-12 | Disposition: A | Discharge status: Home / Self Care | Payer: Medicare Other | Source: Ambulatory Visit | Attending: Internal Medicine | Admitting: Internal Medicine

## 2016-10-12 DIAGNOSIS — I5023 Acute on chronic systolic (congestive) heart failure: Secondary | ICD-10-CM

## 2016-10-12 DIAGNOSIS — I5021 Acute systolic (congestive) heart failure: Secondary | ICD-10-CM | POA: Diagnosis not present

## 2016-10-12 NOTE — Progress Notes (Signed)
Daily Session Note  Patient Details  Name: Bruce Travis MRN: [id number] Date of Birth: 1934-11-23 Referring Provider:   April Manson Pulmonary Rehab Walk Test from 09/23/2016 in [place]  Referring Provider  Dr. Nelda Marseille      Encounter Date: 10/12/2016  Check In:     Session Check In - 10/12/16 1029      Check-In   [facility]   Staff Present Rosebud Poles, RN, BSN;Electra Paladino, MS, ACSM RCEP, Exercise Physiologist;Lisa Ysidro Evert, RN;Portia Rollene Rotunda, RN, BSN   Supervising physician immediately available to respond to emergencies [facility] immediately available   Physician(s) Dr. Candiss Norse   Medication changes reported     No   Fall or balance concerns reported    No   Warm-up and Cool-down Performed as group-led instruction   Resistance Training Performed Yes   VAD Patient? No     Pain Assessment   Currently in Pain? No/denies   Multiple Pain Sites No      Capillary Blood Glucose: No results found for this or any previous visit (from the past 24 hour(s)).      Exercise Prescription Changes - 10/12/16 1200      Response to Exercise   Blood Pressure (Admit) 106/60   Blood Pressure (Exercise) 120/62   Blood Pressure (Exit) 106/58   Heart Rate (Admit) 72 bpm   Heart Rate (Exercise) 77 bpm   Heart Rate (Exit) 69 bpm   Oxygen Saturation (Admit) 94 %   Oxygen Saturation (Exercise) 92 %   Oxygen Saturation (Exit) 94 %   Rating of Perceived Exertion (Exercise) 13   Perceived Dyspnea (Exercise) 2   Duration Progress to 45 minutes of aerobic exercise without signs/symptoms of physical distress   Intensity THRR unchanged     Progression   Progression Continue to progress workloads to maintain intensity without signs/symptoms of physical distress.     Resistance Training   Training Prescription Yes   Weight orange bands   Reps 10-12  10 minutes of strength training     Interval Training   Interval Training No      Oxygen   Oxygen Continuous   Liters 3     Bike   Level 1  scifit   Minutes 17     NuStep   Level 4   Minutes 17   METs 2.2     Track   Laps 10   Minutes 17     Goals Met:  Exercise tolerated well No report of cardiac concerns or symptoms Strength training completed today  Goals Unmet:  Not Applicable  Comments: Service time is from 10:30am to 12:05p    Dr. Rush Farmer is Medical Director for Pulmonary Rehab at [facility].

## 2016-10-14 ENCOUNTER — Encounter (HOSPITAL_COMMUNITY)
Admission: RE | Admit: 2016-10-14 | Discharge: 2016-10-14 | Disposition: A | Discharge status: Home / Self Care | Payer: Medicare Other | Source: Ambulatory Visit | Attending: Internal Medicine | Admitting: Internal Medicine

## 2016-10-14 VITALS — Wt 127.6 lb

## 2016-10-14 DIAGNOSIS — I5021 Acute systolic (congestive) heart failure: Secondary | ICD-10-CM | POA: Diagnosis not present

## 2016-10-14 DIAGNOSIS — I5023 Acute on chronic systolic (congestive) heart failure: Secondary | ICD-10-CM

## 2016-10-14 NOTE — Progress Notes (Signed)
Daily Session Note  Patient Details  Name: Bruce Travis MRN: [id number] Date of Birth: Nov 02, 1934 Referring Provider:   April Manson Pulmonary Rehab Walk Test from 09/23/2016 in [place]  Referring Provider  Dr. Nelda Marseille      Encounter Date: 10/14/2016  Check In:     Session Check In - 10/14/16 1102      Check-In   [facility]   Staff Present Su Hilt, MS, ACSM RCEP, Exercise Physiologist;Portia Rollene Rotunda, RN, Roque Cash, RN   Supervising physician immediately available to respond to emergencies [facility] immediately available   Physician(s) Dr. Tana Coast   Medication changes reported     No   Fall or balance concerns reported    No   Warm-up and Cool-down Performed as group-led instruction   Resistance Training Performed Yes   VAD Patient? No     Pain Assessment   Currently in Pain? No/denies   Multiple Pain Sites No      Capillary Blood Glucose: No results found for this or any previous visit (from the past 24 hour(s)).      Exercise Prescription Changes - 10/14/16 1200      Exercise Review   Progression Yes     Response to Exercise   Blood Pressure (Admit) 100/52   Blood Pressure (Exercise) 130/70   Blood Pressure (Exit) 122/76   Heart Rate (Admit) 63 bpm   Heart Rate (Exercise) 70 bpm   Heart Rate (Exit) 61 bpm   Oxygen Saturation (Admit) 93 %   Oxygen Saturation (Exercise) 91 %   Oxygen Saturation (Exit) 97 %   Rating of Perceived Exertion (Exercise) 13   Perceived Dyspnea (Exercise) 2   Duration Progress to 45 minutes of aerobic exercise without signs/symptoms of physical distress   Intensity THRR unchanged     Progression   Progression Continue to progress workloads to maintain intensity without signs/symptoms of physical distress.     Resistance Training   Training Prescription Yes   Weight orange bands   Reps 10-12  10 minutes of strength training     Interval Training   Interval Training No     Oxygen   Oxygen Continuous   Liters 3     Bike   Level 3  scifit   Minutes 17     NuStep   Level 4   Minutes 17   METs 2.3     Track   Laps --   Minutes --     Goals Met:  Exercise tolerated well No report of cardiac concerns or symptoms Strength training completed today  Goals Unmet:  Not Applicable  Comments: Service time is from 10:30AM to 12:15P    Dr. Rush Farmer is Medical Director for Pulmonary Rehab at [facility].

## 2016-10-19 ENCOUNTER — Encounter (HOSPITAL_COMMUNITY)
Admission: RE | Admit: 2016-10-19 | Discharge: 2016-10-19 | Disposition: A | Discharge status: Home / Self Care | Payer: Medicare Other | Source: Ambulatory Visit | Attending: Internal Medicine | Admitting: Internal Medicine

## 2016-10-19 DIAGNOSIS — I5021 Acute systolic (congestive) heart failure: Secondary | ICD-10-CM

## 2016-10-19 DIAGNOSIS — I5023 Acute on chronic systolic (congestive) heart failure: Secondary | ICD-10-CM

## 2016-10-19 NOTE — Progress Notes (Signed)
While exercising at Pulmonary Rehab, [place] consistently uses 4 liters of continuous flow oxygen. This is provided by Pulmonary Rehab [facility]. At home, the patient currently uses a POC for their home oxygen system. While exercising at home, the patient was instructed to use 3 liters pulsed. In Pulmonary Rehab today, [place] walked the track utilizing their own home oxygen system. They used 3 liters pulsed which maintained their oxygen saturation at 87%. The patient's POC highest flow is 3 liters pulsed.

## 2016-10-19 NOTE — Progress Notes (Signed)
I have reviewed a Home Exercise Prescription with Londell Moh . Tobie is currently exercising at home.  The patient was advised to walk 2-3 days a week for 30-45 minutes.  Tyse and I discussed how to progress their exercise prescription.  The patient stated that their goals were to be able to work in the yard again.  The patient stated that they understand the exercise prescription.  We reviewed exercise guidelines, target heart rate during exercise, oxygen use, weather, home pulse oximeter, endpoints for exercise, and goals.  Patient is encouraged to come to me with any questions. I will continue to follow up with the patient to assist them with progression and safety.

## 2016-10-19 NOTE — Progress Notes (Addendum)
Daily Session Note  Patient Details  Name: Bruce Travis MRN: [id number] Date of Birth: September 15, 1934 Referring Provider:   April Manson Pulmonary Rehab Walk Test from 09/23/2016 in [place]  Referring Provider  Dr. Nelda Marseille      Encounter Date: 10/19/2016  Check In:     Session Check In - 10/19/16 1015      Check-In   [facility]   Staff Present Rosebud Poles, RN, BSN;Sender Rueb, MS, ACSM RCEP, Exercise Physiologist;Lisa Ysidro Evert, RN;Portia Rollene Rotunda, RN, BSN   Supervising physician immediately available to respond to emergencies [facility] immediately available   Physician(s) Dr. Posey Pronto   Medication changes reported     No   Fall or balance concerns reported    No   Tobacco Cessation No Change   Warm-up and Cool-down Performed as group-led instruction   Resistance Training Performed Yes   VAD Patient? No     Pain Assessment   Currently in Pain? No/denies   Multiple Pain Sites No      Capillary Blood Glucose: No results found for this or any previous visit (from the past 24 hour(s)).      Exercise Prescription Changes - 10/19/16 1200      Response to Exercise   Blood Pressure (Admit) 104/50   Blood Pressure (Exercise) 132/60   Blood Pressure (Exit) 100/64   Heart Rate (Admit) 70 bpm   Heart Rate (Exercise) 111 bpm   Heart Rate (Exit) 77 bpm   Oxygen Saturation (Admit) 93 %   Oxygen Saturation (Exercise) 87 %   Oxygen Saturation (Exit) 94 %   Rating of Perceived Exertion (Exercise) 13   Perceived Dyspnea (Exercise) 2   Duration Progress to 45 minutes of aerobic exercise without signs/symptoms of physical distress   Intensity THRR unchanged     Progression   Progression Continue to progress workloads to maintain intensity without signs/symptoms of physical distress.     Resistance Training   Training Prescription Yes   Weight orange bands   Reps 10-15   Time 10 Minutes     Interval Training   Interval Training No     Oxygen   Oxygen Continuous   Liters 3     Bike   Level 3  scifit   Minutes 17     NuStep   Level 4   Minutes 17   METs 2.6     Track   Laps 10   Minutes 17     Exercise Review   Progression Yes      History  Smoking Status  . Former Smoker  . Packs/day: 2.00  . Years: 35.00  . Types: Cigarettes  . Quit date: 01/21/1989  Smokeless Tobacco  . Former Systems developer  . Types: Chew  . Quit date: 08/23/2001    Goals Met:  Exercise tolerated well No report of cardiac concerns or symptoms Strength training completed today  Goals Unmet:  Not Applicable  Comments: Service time is from 10:30am to 12:05p Patient was tested on his own portable oxygen concentrator today while walking the track. He was on 3lp and his oxygen saturation dropped down to 87%.    Dr. Rush Farmer is Medical Director for Pulmonary Rehab at [facility].

## 2016-10-21 ENCOUNTER — Encounter (HOSPITAL_COMMUNITY)
Admission: RE | Admit: 2016-10-21 | Discharge: 2016-10-21 | Disposition: A | Discharge status: Home / Self Care | Payer: Medicare Other | Source: Ambulatory Visit | Attending: Internal Medicine | Admitting: Internal Medicine

## 2016-10-21 VITALS — Wt 127.6 lb

## 2016-10-21 DIAGNOSIS — I5021 Acute systolic (congestive) heart failure: Secondary | ICD-10-CM | POA: Diagnosis present

## 2016-10-21 DIAGNOSIS — I5023 Acute on chronic systolic (congestive) heart failure: Secondary | ICD-10-CM

## 2016-10-21 NOTE — Progress Notes (Signed)
Daily Session Note  Patient Details  Name: Bruce Travis MRN: [id number] Date of Birth: 1935/05/12 Referring Provider:   April Manson Pulmonary Rehab Walk Test from 09/23/2016 in [place]  Referring Provider  Dr. Nelda Marseille      Encounter Date: 10/21/2016  Check In:     Session Check In - 10/21/16 1030      Check-In   Location [facility]   Staff Present Rosebud Poles, RN, BSN;Molly diVincenzo, MS, ACSM RCEP, Exercise Physiologist;Dwanna Goshert Ysidro Evert, RN;Portia Rollene Rotunda, RN, BSN   Supervising physician immediately available to respond to emergencies [facility] immediately available   Physician(s) Dr. Sloan Leiter   Medication changes reported     No   Fall or balance concerns reported    No   Tobacco Cessation No Change   Warm-up and Cool-down Performed as group-led instruction   Resistance Training Performed Yes   VAD Patient? No     Pain Assessment   Currently in Pain? No/denies   Multiple Pain Sites No      Capillary Blood Glucose: No results found for this or any previous visit (from the past 24 hour(s)).      Exercise Prescription Changes - 10/21/16 1200      Response to Exercise   Blood Pressure (Admit) 118/64   Blood Pressure (Exercise) 126/76   Blood Pressure (Exit) 144/70   Heart Rate (Admit) 70 bpm   Heart Rate (Exercise) 93 bpm   Heart Rate (Exit) 72 bpm   Oxygen Saturation (Admit) 94 %   Oxygen Saturation (Exercise) 88 %   Oxygen Saturation (Exit) 91 %   Rating of Perceived Exertion (Exercise) 13   Perceived Dyspnea (Exercise) 2   Duration Continue with 45 min of aerobic exercise without signs/symptoms of physical distress.   Intensity THRR unchanged     Progression   Progression Continue to progress workloads to maintain intensity without signs/symptoms of physical distress.     Resistance Training   Training Prescription Yes   Weight orange bands   Reps 10-15   Time 10 Minutes     Interval Training   Interval Training No     Oxygen   Oxygen Continuous   Liters 3     Bike   Level 4   Minutes 17     Track   Laps 11   Minutes 17     Exercise Review   Progression Yes      History  Smoking Status  . Former Smoker  . Packs/day: 2.00  . Years: 35.00  . Types: Cigarettes  . Quit date: 01/21/1989  Smokeless Tobacco  . Former Systems developer  . Types: Chew  . Quit date: 08/23/2001    Goals Met:  Exercise tolerated well No report of cardiac concerns or symptoms Strength training completed today  Goals Unmet:  Not Applicable  Comments: Service time is from 1030 to 1220    Dr. Rush Farmer is Medical Director for Pulmonary Rehab at [facility].

## 2016-10-21 NOTE — Progress Notes (Signed)
Progress Notes Date of Service: 04/22/2016 1:59 PM [name], RD  Nutritional Management  Expand All Collapse All   [] Hide copied text [] Hover for attribution information Bruce Travis 82 y.o. male Nutrition Note Spoke with pt. Pt is underweight for a pulmonary pt. Pt well-known to me from previous admission. Per pt history, pt lost 15 lb when he was dx with Celiac disease and he hasn't been able to gain the wt back. Pt continue to follow a high calorie, high protein diet.  Pt's Rate Your Plate results not reviewed with pt due to pt's need to gain wt. Pt states he avoids most salty food; uses fresh/frozen vegetables. Pt does not add salt to food. Pt expressed understanding of the information reviewed via feedback method.    Nutrition Diagnosis   Food-and nutrition-related knowledge deficit related to lack of exposure to information as related to diagnosis of pulmonary disease  Increased energy expenditure related to increased energy requirements during COPD exacerbation as evidenced by BMI <21 and h/o slow, chronic wt loss.   Nutrition Intervention   Pt's individual nutrition plan and goals reviewed with pt.  Benefits of adopting healthy eating habits discussed when pt's Rate Your Plate reviewed.  Pt to attend the Nutrition and Lung Disease class  Continual client-centered nutrition education by RD, as part of interdisciplinary care. Goal(s) 1. Identify food quantities necessary to prevent further wt loss/ achieve wt gain of 1 -2# per week to a wt gain goal of 2.7-10.9 kg (6-24 lb) at graduation from pulmonary rehab. Monitor and Evaluate progress toward nutrition goal with team.

## 2016-10-22 ENCOUNTER — Other Ambulatory Visit: Payer: Self-pay | Admitting: Family Medicine

## 2016-10-26 ENCOUNTER — Encounter (HOSPITAL_COMMUNITY): Payer: Medicare Other

## 2016-10-28 ENCOUNTER — Encounter (HOSPITAL_COMMUNITY)
Admission: RE | Admit: 2016-10-28 | Discharge: 2016-10-28 | Disposition: A | Discharge status: Home / Self Care | Payer: Medicare Other | Source: Ambulatory Visit | Attending: Internal Medicine | Admitting: Internal Medicine

## 2016-10-28 VITALS — Wt 127.9 lb

## 2016-10-28 DIAGNOSIS — I5021 Acute systolic (congestive) heart failure: Secondary | ICD-10-CM | POA: Diagnosis not present

## 2016-10-28 DIAGNOSIS — I5023 Acute on chronic systolic (congestive) heart failure: Secondary | ICD-10-CM

## 2016-10-28 NOTE — Progress Notes (Signed)
Daily Session Note  Patient Details  Name: Bruce Travis MRN: [id number] Date of Birth: 01-05-1935 Referring Provider:   April Manson Pulmonary Rehab Walk Test from 09/23/2016 in [place]  Referring Provider  Dr. Nelda Marseille      Encounter Date: 10/28/2016  Check In:     Session Check In - 10/28/16 1030      Check-In   Location [facility]   Staff Present Rosebud Poles, RN, Luisa Hart, RN, BSN;Molly diVincenzo, MS, ACSM RCEP, Exercise Physiologist   Supervising physician immediately available to respond to emergencies [facility] immediately available   Physician(s) Dr. Tana Coast   Medication changes reported     No   Fall or balance concerns reported    No   Tobacco Cessation No Change   Warm-up and Cool-down Performed as group-led instruction   Resistance Training Performed Yes   VAD Patient? No     Pain Assessment   Currently in Pain? No/denies   Multiple Pain Sites No      Capillary Blood Glucose: No results found for this or any previous visit (from the past 24 hour(s)).      Exercise Prescription Changes - 10/28/16 1300      Response to Exercise   Blood Pressure (Admit) 108/50   Blood Pressure (Exercise) 128/79   Blood Pressure (Exit) 110/70   Heart Rate (Admit) 68 bpm   Heart Rate (Exercise) 82 bpm   Heart Rate (Exit) 65 bpm   Oxygen Saturation (Admit) 95 %   Oxygen Saturation (Exercise) 93 %   Oxygen Saturation (Exit) 96 %   Rating of Perceived Exertion (Exercise) 13   Perceived Dyspnea (Exercise) 2   Duration Continue with 45 min of aerobic exercise without signs/symptoms of physical distress.   Intensity THRR unchanged     Progression   Progression Continue to progress workloads to maintain intensity without signs/symptoms of physical distress.     Resistance Training   Training Prescription Yes   Weight orange bands   Reps 10-15   Time 10 Minutes     Interval Training   Interval Training No     Oxygen   Oxygen Continuous   Liters 3     Bike   Level 4   Minutes 17     NuStep   Level 4   Minutes 17   METs 2.2      History  Smoking Status  . Former Smoker  . Packs/day: 2.00  . Years: 35.00  . Types: Cigarettes  . Quit date: 01/21/1989  Smokeless Tobacco  . Former Systems developer  . Types: Chew  . Quit date: 08/23/2001    Goals Met:  Proper associated with RPD/PD & O2 Sat [place] with exercise equipment Exercise tolerated well Strength training completed today  Goals Unmet:  Not Applicable  Comments: Service time is from 1030 to 1230    Dr. Rush Farmer is Medical Director for Pulmonary Rehab at [facility].

## 2016-11-02 ENCOUNTER — Encounter (HOSPITAL_COMMUNITY)
Admission: RE | Admit: 2016-11-02 | Discharge: 2016-11-02 | Disposition: A | Discharge status: Home / Self Care | Payer: Medicare Other | Source: Ambulatory Visit | Attending: Internal Medicine | Admitting: Internal Medicine

## 2016-11-02 VITALS — Wt 127.9 lb

## 2016-11-02 DIAGNOSIS — I5021 Acute systolic (congestive) heart failure: Secondary | ICD-10-CM

## 2016-11-02 DIAGNOSIS — I5023 Acute on chronic systolic (congestive) heart failure: Secondary | ICD-10-CM

## 2016-11-02 NOTE — Progress Notes (Signed)
Daily Session Note  Patient Details  Name: Bruce Travis MRN: [id number] Date of Birth: 11/24/1934 Referring Provider:   April Manson Pulmonary Rehab Walk Test from 09/23/2016 in [place]  Referring Provider  Dr. Nelda Marseille      Encounter Date: 11/02/2016  Check In:     Session Check In - 11/02/16 1234      Check-In   Location [facility]   Staff Present Su Hilt, MS, ACSM RCEP, Exercise Physiologist;Portia Rollene Rotunda, RN, Marga Melnick, RN, Deland Pretty, MS, ACSM CEP, Exercise Physiologist;Lisa Ysidro Evert, RN   Supervising physician immediately available to respond to emergencies [facility] immediately available   Physician(s) Dr. Maylene Roes   Medication changes reported     No   Fall or balance concerns reported    No   Tobacco Cessation No Change   Warm-up and Cool-down Performed as group-led instruction   Resistance Training Performed Yes   VAD Patient? No     Pain Assessment   Currently in Pain? No/denies   Multiple Pain Sites No      Capillary Blood Glucose: No results found for this or any previous visit (from the past 24 hour(s)).      Exercise Prescription Changes - 11/02/16 1200      Response to Exercise   Blood Pressure (Admit) 104/46   Blood Pressure (Exercise) 140/72   Blood Pressure (Exit) 118/70   Heart Rate (Admit) 65 bpm   Heart Rate (Exercise) 88 bpm   Heart Rate (Exit) 67 bpm   Oxygen Saturation (Admit) 96 %   Oxygen Saturation (Exercise) 86 %   Oxygen Saturation (Exit) 91 %   Rating of Perceived Exertion (Exercise) 13   Perceived Dyspnea (Exercise) 2   Duration Continue with 45 min of aerobic exercise without signs/symptoms of physical distress.   Intensity THRR unchanged     Progression   Progression Continue to progress workloads to maintain intensity without signs/symptoms of physical distress.     Resistance Training   Training Prescription Yes   Weight orange bands   Reps 10-15    Time 10 Minutes     Interval Training   Interval Training No     Oxygen   Oxygen Continuous   Liters 3     Bike   Level 4   Minutes 17     NuStep   Level 4   Minutes 17   METs 2.7     Track   Laps 12   Minutes 17      History  Smoking Status  . Former Smoker  . Packs/day: 2.00  . Years: 35.00  . Types: Cigarettes  . Quit date: 01/21/1989  Smokeless Tobacco  . Former Systems developer  . Types: Chew  . Quit date: 08/23/2001    Goals Met:  Exercise tolerated well No report of cardiac concerns or symptoms Strength training completed today  Goals Unmet:  Not Applicable  Comments: Service time is from 10:30a to 12:10p    Dr. Rush Farmer is Medical Director for Pulmonary Rehab at [facility].

## 2016-11-02 NOTE — Progress Notes (Signed)
Pulmonary Individual Treatment Plan  Patient Details  Name: JILL RUPPE MRN: [id number] Date of Birth: [birth year] Referring Provider:   April Manson Pulmonary Rehab Walk Test from 09/23/2016 in [place]  Referring Provider  Dr. Nelda Marseille      Initial Encounter Date:  Flowsheet Row Pulmonary Rehab Walk Test from 09/23/2016 in [place]  Date  09/23/16  Referring Provider  Dr. Nelda Marseille      Visit Diagnosis: Acute on chronic systolic heart failure ([place])  Patient's Home Medications on Admission:   Current Outpatient Prescriptions:  .  albuterol (PROAIR HFA) 108 (90 Base) MCG/ACT inhaler, INHALE 2 PUFFS EVERY 4 HOURS AS NEEDED FOR SHORTNESS OF BREATH., Disp: 8.5 g, Rfl: 5 .  AMBULATORY NON FORMULARY MEDICATION, O2 2 Lpm continuous, Disp: , Rfl:  .  amiodarone (PACERONE) 200 MG tablet, Take 0.5 tablets (100 mg total) by mouth daily., Disp: 30 tablet, Rfl: 6 .  aspirin EC 81 MG tablet, Take 81 mg by mouth daily., Disp: , Rfl:  .  azelastine (ASTELIN) 0.1 % nasal spray, PLACE 2 SPRAYS INTO EACH NOSTRILS TWICE DAILY AS DIRECTED., Disp: 30 mL, Rfl: 0 .  Calcium Carbonate-Vitamin D (CALTRATE 600+D) 600-400 MG-UNIT tablet, Take 1 tablet by mouth daily., Disp: , Rfl:  .  ENTRESTO 49-51 MG, TAKE (1) TABLET BY MOUTH TWICE DAILY., Disp: 60 tablet, Rfl: 6 .  HYDROcodone-homatropine (HYCODAN) 5-1.5 MG/5ML syrup, Take 5 mLs by mouth every 8 (eight) hours as needed for cough., Disp: 120 mL, Rfl: 0 .  montelukast (SINGULAIR) 10 MG tablet, TAKE 1 TABLET BY MOUTH DAILY., Disp: 90 tablet, Rfl: 0 .  Multiple Vitamin (MULTIVITAMIN WITH MINERALS) TABS tablet, Take 1 tablet by mouth daily., Disp: , Rfl:  .  pantoprazole (PROTONIX) 40 MG tablet, TAKE ONE TABLET BY MOUTH DAILY., Disp: 30 tablet, Rfl: 0 .  polyethylene glycol (MIRALAX / GLYCOLAX) packet, Take 17 g by mouth daily as needed for mild constipation. , Disp: , Rfl:  .  predniSONE (DELTASONE) 10  MG tablet, Taper dose, Disp: , Rfl:  .  SPIRIVA HANDIHALER 18 MCG inhalation capsule, INHALE 1 CAPSULE ONCE DAILY AS DIRECTED, Disp: 30 capsule, Rfl: 11 .  spironolactone (ALDACTONE) 25 MG tablet, Take 0.5 tablets (12.5 mg total) by mouth daily., Disp: 16 tablet, Rfl: 6 .  SYMBICORT 160-4.5 MCG/ACT inhaler, INHALE 2 PUFFS FIRST THING IN MORNING AND THEN ANOTHER 2 PUFFS ABOUT 12 HOURS LATER., Disp: 10.2 g, Rfl: 3  Past Medical History: Past Medical History:  Diagnosis Date  . Allergic rhinitis   . Bronchiectasis   . CAD (coronary artery disease)   . Celiac disease   . Chronic heart failure ([place])   . Chronic kidney disease (CKD), stage III (moderate)   . COPD (chronic obstructive pulmonary disease) ([place])   . Diverticulosis of colon (without mention of hemorrhage)   . Esophageal reflux   . Family history of adverse reaction to anesthesia    daughter gets PONV  . Family hx of colon cancer   . History of stomach ulcers 1980s  . Hyperlipemia   . Hypertension   . 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 Myocardial infarction    "previous MI/echo in 12/2015"  . Nephrolithiasis    "got them now; never had OR/scopes" (02/03/2016)  . On home oxygen therapy    "2L usually; 3L last 3-4 days; 24/7" (08/06/2016)  . Other diseases of lung, not elsewhere  classified   . Personal history of colonic polyps 04/26/2007   hyperplastic   . Pneumonia "several times"    Tobacco Use: History  Smoking Status  . Former Smoker  . Packs/day: 2.00  . Years: 35.00  . Types: Cigarettes  . Quit date: 01/21/1989  Smokeless Tobacco  . Former Systems developer  . Types: Chew  . Quit date: 08/23/2001    Labs: Recent Review Flowsheet Data    Labs for ITP Cardiac and Pulmonary Rehab Latest Ref Rng & Units 02/04/2016 02/05/2016 02/06/2016 03/09/2016 08/06/2016   Cholestrol 125 - 200 mg/dL - - - 208(H) -   LDLCALC <130 mg/dL - - - 132(H) -   HDL >=40 mg/dL - - - 33(L) -   Trlycerides <150 mg/dL - - -  217(H) -   Hemoglobin A1c 4.8 - 5.6 % - 5.6 - - -   PHART 7.350 - 7.450 - - - - 7.447   PCO2ART 32.0 - 48.0 mmHg - - - - 33.8   HCO3 20.0 - 28.0 mmol/L - - - - 23.3   TCO2 0 - 100 mmol/L - - - - 24   ACIDBASEDEF 0.0 - 2.0 mmol/L - - - - -   O2SAT % 67.0 80.1 72.0 - 93.0      Capillary Blood Glucose: No results found for: GLUCAP   ADL UCSD:     Pulmonary Assessment Scores    Row Name 07/01/16 1649 09/22/16 1221       ADL UCSD   ADL Phase Exit Entry    SOB Score total 71 45       Pulmonary Function Assessment:     Pulmonary Function Assessment - 09/20/16 1025      Breath   Bilateral Breath Sounds Clear   Shortness of Breath Limiting activity;Panic with Shortness of Breath;Fear of Shortness of Breath;Yes      Exercise Target Goals:    Exercise Program Goal: Individual exercise prescription set with THRR, safety & activity barriers. Participant demonstrates ability to understand and report RPE using BORG scale, to self-measure pulse accurately, and to acknowledge the importance of the exercise prescription.  Exercise Prescription Goal: Starting with aerobic activity 30 plus minutes a day, 3 days per week for initial exercise prescription. Provide home exercise prescription and guidelines that participant acknowledges understanding prior to discharge.  Activity Barriers & Risk Stratification:     Activity Barriers & Cardiac Risk Stratification - 09/20/16 1019      Activity Barriers & Cardiac Risk Stratification   Activity Barriers None      6 Minute Walk:     6 Minute Walk    Row Name 07/06/16 1312 09/23/16 1657       6 Minute Walk   Phase Discharge Initial    Distance 1655 feet 1544 feet    Walk Time 6 minutes 6 minutes    # of Rest Breaks 0 0    MPH 3.13 2.92    METS 3.37 3.22    RPE 14 13    Perceived Dyspnea  3 2    Symptoms No  -    Resting HR 72 bpm 73 bpm    Resting BP 108/60 124/70    Max Ex. HR 99 bpm 99 bpm    Max Ex. BP 174/60 154/68     2 Minute Post BP 144/60  -      Interval HR   Baseline HR 72 73    1 Minute HR 96 65  2 Minute HR 96 78    3 Minute HR 81 83    4 Minute HR 81 88    5 Minute HR 90 93    6 Minute HR 99 99    2 Minute Post HR 85 85    Interval Heart Rate? Yes Yes      Interval Oxygen   Interval Oxygen? Yes Yes    Baseline Oxygen Saturation % 93 % 92 %    Baseline Liters of Oxygen 4 L 4 L    1 Minute Oxygen Saturation % 91 % 92 %    1 Minute Liters of Oxygen 4 L 4 L    2 Minute Oxygen Saturation % 91 % 91 %    2 Minute Liters of Oxygen 4 L 4 L    3 Minute Oxygen Saturation % 86 % 90 %    3 Minute Liters of Oxygen 4 L 4 L    4 Minute Oxygen Saturation % 86 % 88 %    4 Minute Liters of Oxygen 6 L 4 L    5 Minute Oxygen Saturation % 85 % 87 %    5 Minute Liters of Oxygen 6 L 4 L    6 Minute Oxygen Saturation % 85 % 86 %    6 Minute Liters of Oxygen 6 L 6 L    2 Minute Post Oxygen Saturation % 92 % 91 %    2 Minute Post Liters of Oxygen 6 L 6 L       Oxygen Initial Assessment:     Oxygen Initial Assessment - 11/01/16 1036      Home Oxygen   Home Oxygen Device Portable Concentrator;Home Concentrator   Sleep Oxygen Prescription Continuous   Home Exercise Oxygen Prescription Continuous   Home at Rest Exercise Oxygen Prescription Continuous   Compliance with Home Oxygen Use Yes     Intervention   Short Term Goals To learn and exhibit compliance with exercise, home and travel O2 prescription;To learn and understand importance of monitoring SPO2 with pulse oximeter and demonstrate accurate use of the pulse oximeter.;To Learn and understand importance of maintaining oxygen saturations>88%;To learn and demonstrate proper purse lipped breathing techniques or other breathing techniques.;To learn and demonstrate proper use of respiratory medications   Long  Term Goals Exhibits compliance with exercise, home and travel O2 prescription;Verbalizes importance of monitoring SPO2 with pulse oximeter  and return demonstration;Maintenance of O2 saturations>88%;Exhibits proper breathing techniques, such as purse lipped breathing or other method taught during program session;Compliance with respiratory medication;Demonstrates proper use of MDI's      Oxygen Re-Evaluation:   Oxygen Discharge (Final Oxygen Re-Evaluation):   Initial Exercise Prescription:     Initial Exercise Prescription - 09/23/16 1600      Date of Initial Exercise RX and Referring Provider   Date 09/23/16   Referring Provider Dr. Nelda Marseille     Oxygen   Oxygen Continuous   Liters 6     Bike   Level 0.5   Minutes 17     NuStep   Level 4   Minutes 17   METs 2     Track   Laps 10   Minutes 17     Prescription Details   Frequency (times per week) 2   Duration Progress to 45 minutes of aerobic exercise without signs/symptoms of physical distress     Intensity   THRR 40-80% of Max Heartrate 56-113   Ratings of Perceived Exertion 11-13  Perceived Dyspnea 0-4     Progression   Progression Continue progressive overload as per policy without signs/symptoms or physical distress.     Resistance Training   Training Prescription Yes   Weight orange bands   Reps 10-12      Perform Capillary Blood Glucose checks as needed.  Exercise Prescription Changes:     Exercise Prescription Changes    Row Name 05/06/16 1200 05/11/16 1200 05/13/16 1300 05/18/16 1200 05/25/16 1200     Response to Exercise   Blood Pressure (Admit) 100/60 120/60 126/70 110/56 122/70   Blood Pressure (Exercise) 126/80 150/70  - 122/66 106/66   Blood Pressure (Exit) 126/72 122/62 118/62 106/60 130/80   Heart Rate (Admit) 61 bpm 67 bpm 65 bpm 59 bpm 63 bpm   Heart Rate (Exercise) 91 bpm 78 bpm 70 bpm 86 bpm 82 bpm   Heart Rate (Exit) 68 bpm 65 bpm 59 bpm 62 bpm 64 bpm   Oxygen Saturation (Admit) 95 % 92 % 94 % 94 % 93 %   Oxygen Saturation (Exercise) 91 % 90 % 93 % 89 % 88 %   Oxygen Saturation (Exit) 92 % 95 % 97 % 93 % 94 %    Rating of Perceived Exertion (Exercise) 13 13 13 13 13    Perceived Dyspnea (Exercise) 2 2 2 2 2    Duration Progress to 45 minutes of aerobic exercise without signs/symptoms of physical distress Progress to 45 minutes of aerobic exercise without signs/symptoms of physical distress Progress to 45 minutes of aerobic exercise without signs/symptoms of physical distress Progress to 45 minutes of aerobic exercise without signs/symptoms of physical distress Progress to 45 minutes of aerobic exercise without signs/symptoms of physical distress   Intensity THRR unchanged THRR unchanged THRR unchanged THRR unchanged THRR unchanged     Progression   Progression Continue to progress workloads to maintain intensity without signs/symptoms of physical distress. Continue to progress workloads to maintain intensity without signs/symptoms of physical distress. Continue to progress workloads to maintain intensity without signs/symptoms of physical distress. Continue to progress workloads to maintain intensity without signs/symptoms of physical distress. Continue to progress workloads to maintain intensity without signs/symptoms of physical distress.     Resistance Training   Training Prescription Yes Yes Yes Yes Yes   Weight orange bands orange bands orange bands orange bands orange bands   Reps 10-12  10 minutes of strength training 10-12  10 minutes of strength training 10-12  10 minutes of strength training 10-12  10 minutes of strength training 10-12  10 minutes of strength training     Interval Training   Interval Training No No No No No     Oxygen   Oxygen Continuous Continuous Continuous Continuous  -   Liters  - 3 3 3   -     Recumbant Bike   Level 3 3 3 3 4    Minutes 17 17 17 17 17      NuStep   Level 4 4 4 4 4    Minutes 17 17 17 17 17    METs 2.3 2.4 2.4 2.2 2.2     Track   Laps  - 16  - 14 16   Minutes  - 17  - 17 17     Exercise Review   Progression  -  -  -  - Yes   Row Name 05/27/16  1200 06/01/16 1217 06/03/16 1200 06/08/16 1200 06/10/16 1200     Response to Exercise   Blood Pressure (  Admit) 106/54 102/64 100/60 110/56 110/60   Blood Pressure (Exercise) 126/64 126/60 130/66 120/60 148/60   Blood Pressure (Exit) 120/70 108/56 118/64 106/60 114/62   Heart Rate (Admit) 61 bpm 63 bpm 54 bpm 66 bpm 63 bpm   Heart Rate (Exercise) 63 bpm 86 bpm 69 bpm 76 bpm 74 bpm   Heart Rate (Exit) 77 bpm 61 bpm 67 bpm 67 bpm 58 bpm   Oxygen Saturation (Admit) 96 % 91 % 95 % 95 % 94 %   Oxygen Saturation (Exercise) 91 % 87 % 86 % 88 % 90 %   Oxygen Saturation (Exit) 96 % 94 % 96 % 94 % 97 %   Rating of Perceived Exertion (Exercise) 13 15 13 13 13    Perceived Dyspnea (Exercise) 1 3 3 1 2    Duration Progress to 45 minutes of aerobic exercise without signs/symptoms of physical distress Progress to 45 minutes of aerobic exercise without signs/symptoms of physical distress Progress to 45 minutes of aerobic exercise without signs/symptoms of physical distress Progress to 45 minutes of aerobic exercise without signs/symptoms of physical distress Progress to 45 minutes of aerobic exercise without signs/symptoms of physical distress   Intensity THRR unchanged THRR unchanged THRR unchanged THRR unchanged THRR unchanged     Progression   Progression Continue to progress workloads to maintain intensity without signs/symptoms of physical distress. Continue to progress workloads to maintain intensity without signs/symptoms of physical distress. Continue to progress workloads to maintain intensity without signs/symptoms of physical distress. Continue to progress workloads to maintain intensity without signs/symptoms of physical distress. Continue to progress workloads to maintain intensity without signs/symptoms of physical distress.     Resistance Training   Training Prescription Yes Yes Yes Yes Yes   Weight orange bands orange bands orange bands orange bands orange bands   Reps 10-12  10 minutes of  strength training 10-12  10 minutes of strength training 10-12  10 minutes of strength training 10-12  10 minutes of strength training 10-12  10 minutes of strength training     Interval Training   Interval Training No No No No No     Oxygen   Oxygen Continuous Continuous Continuous  desaturating on track increased to 4 L Continuous Continuous   Liters 3 3 4 4 4      Recumbant Bike   Level 4 4  - 4 4   Minutes 17 17  - 17 17     NuStep   Level 4 4 4 4   -   Minutes 17 17 17 17   -   METs 2.4 2.2 2 2.3  -     Track   Laps  - 16 15 18 16    Minutes  - 17 17 17 17    Row Name 06/15/16 1200 06/22/16 1200 06/29/16 1200 07/01/16 1300 09/30/16 1200     Response to Exercise   Blood Pressure (Admit) 120/70 127/74 130/64 104/56 110/60   Blood Pressure (Exercise) 126/70 140/58 140/64 136/76 126/70   Blood Pressure (Exit) 118/68 96/60 126/64 110/64 116/76   Heart Rate (Admit) 60 bpm 66 bpm 72 bpm 70 bpm 69 bpm   Heart Rate (Exercise) 81 bpm 78 bpm 81 bpm 78 bpm 86 bpm   Heart Rate (Exit) 70 bpm 67 bpm 73 bpm 63 bpm 69 bpm   Oxygen Saturation (Admit) 94 % 92 % 92 % 93 % 96 %   Oxygen Saturation (Exercise) 88 % 83 %  sat increased to 88% with rest 87 %  sat increased to 88% with rest 88 %  sat increased to 88% with rest 93 %   Oxygen Saturation (Exit) 93 % 93 % 97 % 95 % 96 %   Rating of Perceived Exertion (Exercise) 13 13 13 13 13    Perceived Dyspnea (Exercise) 2 3 2 2 1    Duration Progress to 45 minutes of aerobic exercise without signs/symptoms of physical distress Progress to 45 minutes of aerobic exercise without signs/symptoms of physical distress Progress to 45 minutes of aerobic exercise without signs/symptoms of physical distress Progress to 45 minutes of aerobic exercise without signs/symptoms of physical distress Progress to 45 minutes of aerobic exercise without signs/symptoms of physical distress   Intensity THRR unchanged THRR unchanged THRR unchanged THRR unchanged THRR  unchanged     Progression   Progression Continue to progress workloads to maintain intensity without signs/symptoms of physical distress. Continue to progress workloads to maintain intensity without signs/symptoms of physical distress. Continue to progress workloads to maintain intensity without signs/symptoms of physical distress. Continue to progress workloads to maintain intensity without signs/symptoms of physical distress. Continue to progress workloads to maintain intensity without signs/symptoms of physical distress.     Resistance Training   Training Prescription Yes Yes Yes Yes Yes   Weight orange bands orange bands orange bands orange bands orange bands   Reps 10-12  10 minutes of strength training 10-12  10 minutes of strength training 10-12  10 minutes of strength training 10-12  10 minutes of strength training 10-12  10 minutes of strength training     Interval Training   Interval Training No No No No No     Oxygen   Oxygen Continuous Continuous Continuous Continuous Continuous   Liters 4 4 4 4 4      Bike   Level  -  -  -  - 3  scifit   Minutes  -  -  -  - 17     Recumbant Bike   Level 4 4 5   -  -   Minutes 17 17 17   -  -     NuStep   Level 5 5 5 5 4    Minutes 17 17 17 17 17    METs 2.3 2.4 2.3 2.5 2.3     Track   Laps 17 15 16 15   -   Minutes 17 17 17 17   -     Exercise Review   Progression Yes  - Yes  -  -   Row Name 10/05/16 1200 10/07/16 1200 10/12/16 1200 10/14/16 1200 10/19/16 1200     Response to Exercise   Blood Pressure (Admit) 110/50 120/70 106/60 100/52 104/50   Blood Pressure (Exercise) 108/60 140/70 120/62 130/70 132/60   Blood Pressure (Exit) 104/60 122/80 106/58 122/76 100/64   Heart Rate (Admit) 69 bpm 70 bpm 72 bpm 63 bpm 70 bpm   Heart Rate (Exercise) 82 bpm 72 bpm 77 bpm 70 bpm 111 bpm   Heart Rate (Exit) 75 bpm 65 bpm 69 bpm 61 bpm 77 bpm   Oxygen Saturation (Admit) 96 % 93 % 94 % 93 % 93 %   Oxygen Saturation (Exercise) 88 % 86 % 92  % 91 % 87 %   Oxygen Saturation (Exit) 95 % 93 % 94 % 97 % 94 %   Rating of Perceived Exertion (Exercise) 14 13 13 13 13    Perceived Dyspnea (Exercise) 2 2 2 2 2    Duration Progress to 45 minutes of aerobic  exercise without signs/symptoms of physical distress Progress to 45 minutes of aerobic exercise without signs/symptoms of physical distress Progress to 45 minutes of aerobic exercise without signs/symptoms of physical distress Progress to 45 minutes of aerobic exercise without signs/symptoms of physical distress Progress to 45 minutes of aerobic exercise without signs/symptoms of physical distress   Intensity THRR unchanged THRR unchanged THRR unchanged THRR unchanged THRR unchanged     Progression   Progression Continue to progress workloads to maintain intensity without signs/symptoms of physical distress. Continue to progress workloads to maintain intensity without signs/symptoms of physical distress. Continue to progress workloads to maintain intensity without signs/symptoms of physical distress. Continue to progress workloads to maintain intensity without signs/symptoms of physical distress. Continue to progress workloads to maintain intensity without signs/symptoms of physical distress.     Resistance Training   Training Prescription Yes Yes Yes Yes Yes   Weight orange bands orange bands orange bands orange bands orange bands   Reps 10-12  10 minutes of strength training 10-12  10 minutes of strength training 10-12  10 minutes of strength training 10-12  10 minutes of strength training 10-15   Time  -  -  -  - 10 Minutes     Interval Training   Interval Training No No No No No     Oxygen   Oxygen Continuous Continuous Continuous Continuous Continuous   Liters 3 3 3 3 3      Bike   Level 3  scifit -  scifit 1  scifit 3  scifit 3  scifit   Minutes 17 - 17 17 17      NuStep   Level 4 4 4 4 4    Minutes 17 17 17 17 17    METs 2.3 2.1 2.2 2.3 2.6     Track   Laps 12 13 10  -  10   Minutes 17 17 17  - 17     Home Exercise Plan   Plans to continue exercise at  -  -  -  - Home (comment)   Frequency  -  -  -  - Add 3 additional days to program exercise sessions.   Initial Home Exercises Provided  -  -  -  - -  walking     Exercise Review   Progression  -  -  - Yes Yes   Row Name 10/21/16 1200 10/28/16 1300           Response to Exercise   Blood Pressure (Admit) 118/64 108/50      Blood Pressure (Exercise) 126/76 128/79      Blood Pressure (Exit) 144/70 110/70      Heart Rate (Admit) 70 bpm 68 bpm      Heart Rate (Exercise) 93 bpm 82 bpm      Heart Rate (Exit) 72 bpm 65 bpm      Oxygen Saturation (Admit) 94 % 95 %      Oxygen Saturation (Exercise) 88 % 93 %      Oxygen Saturation (Exit) 91 % 96 %      Rating of Perceived Exertion (Exercise) 13 13      Perceived Dyspnea (Exercise) 2 2      Duration Continue with 45 min of aerobic exercise without signs/symptoms of physical distress. Continue with 45 min of aerobic exercise without signs/symptoms of physical distress.      Intensity THRR unchanged THRR unchanged        Progression   Progression Continue to progress workloads  to maintain intensity without signs/symptoms of physical distress. Continue to progress workloads to maintain intensity without signs/symptoms of physical distress.        Resistance Training   Training Prescription Yes Yes      Weight orange bands orange bands      Reps 10-15 10-15      Time 10 Minutes 10 Minutes        Interval Training   Interval Training No No        Oxygen   Oxygen Continuous Continuous      Liters 3 3        Bike   Level 4 4      Minutes 17 17        NuStep   Level  - 4      Minutes  - 17      METs  - 2.2        Track   Laps 11  -      Minutes 17  -        Exercise Review   Progression Yes  -         Exercise Comments:     Exercise Comments    Row Name 05/31/16 2683 06/22/16 1226 06/29/16 0720 07/06/16 1320 10/04/16 1515   Exercise  Comments Patient consistently works hard and is open to workload progression. Will cont. to monitor.  Patient will be graduating on 07/01/16 from rehab. He is going to look into different options for his post grad exercise between now and the 9th. He was offered our maintenance program. Will follow up. Patient is doing well in program. Will be graduating 07/06/16. Will discuss post grad exercise plans.  Today was Declyn's last day in program. Patient is going to cont. to exercise at the [facility] near his home. Patient has only exercised one class. Will cont. to monitor and progress.    [place] Name 10/19/16 1235           Exercise Comments Home exercise completed          Exercise Goals and Review:   Exercise Goals Re-Evaluation :     Exercise Goals Re-Evaluation    Row Name 11/01/16 1615             Exercise Goal Re-Evaluation   Exercise Goals Review Increase Physical Activity;Increase Strenth and Stamina       Comments Patient is progressing well in program. He is walking at home on his off days from rehab. Will cont. to monitor and progress.       Expected Outcomes Through the exercise here at rehab the patient with increase physical capacity, strength, and stamina.          Discharge Exercise Prescription (Final Exercise Prescription Changes):     Exercise Prescription Changes - 10/28/16 1300      Response to Exercise   Blood Pressure (Admit) 108/50   Blood Pressure (Exercise) 128/79   Blood Pressure (Exit) 110/70   Heart Rate (Admit) 68 bpm   Heart Rate (Exercise) 82 bpm   Heart Rate (Exit) 65 bpm   Oxygen Saturation (Admit) 95 %   Oxygen Saturation (Exercise) 93 %   Oxygen Saturation (Exit) 96 %   Rating of Perceived Exertion (Exercise) 13   Perceived Dyspnea (Exercise) 2   Duration Continue with 45 min of aerobic exercise without signs/symptoms of physical distress.   Intensity THRR unchanged     Progression   Progression Continue to progress workloads to  maintain  intensity without signs/symptoms of physical distress.     Resistance Training   Training Prescription Yes   Weight orange bands   Reps 10-15   Time 10 Minutes     Interval Training   Interval Training No     Oxygen   Oxygen Continuous   Liters 3     Bike   Level 4   Minutes 17     NuStep   Level 4   Minutes 17   METs 2.2      Nutrition:  Target Goals: Understanding of nutrition guidelines, daily intake of sodium <1568m, cholesterol <2026m calories 30% from fat and 7% or less from saturated fats, daily to have 5 or more servings of fruits and vegetables.  Biometrics:     Pre Biometrics - 09/20/16 1029      Pre Biometrics   Grip Strength 33 kg       Nutrition Therapy Plan and Nutrition Goals:     Nutrition Therapy & Goals - 10/21/16 1230      Nutrition Therapy   Diet High Calorie, High Protein     Personal Nutrition Goals   Nutrition Goal Prevent further wt loss/ promote wt gain     Intervention Plan   Intervention Prescribe, educate and counsel regarding individualized specific dietary modifications aiming towards targeted core components such as weight, hypertension, lipid management, diabetes, heart failure and other comorbidities.   Expected Outcomes Short Term Goal: Understand basic principles of dietary content, such as calories, fat, sodium, cholesterol and nutrients.;Long Term Goal: Adherence to prescribed nutrition plan.      Nutrition Discharge: Rate Your Plate Scores:     Nutrition Assessments - 10/21/16 1230      Rate Your Plate Scores   Pre Score 41  Rate Your Plate goal < 49 due to pt need to gain wt/prevent wt loss      Nutrition Goals Re-Evaluation:     Nutrition Goals Re-Evaluation    Row Name 07/21/16 1042             Goals   Current Weight 133 lb (60.3 kg)       Nutrition Goal 1-2 lb wt gain per week to a wt gain goal of 6-24 lb       Comment Pt wt is up 4 lb since admission.          Personal Goal #1 Re-Evaluation    Goal Progress Seen Yes          Nutrition Goals Discharge (Final Nutrition Goals Re-Evaluation):     Nutrition Goals Re-Evaluation - 07/21/16 1042      Goals   Current Weight 133 lb (60.3 kg)   Nutrition Goal 1-2 lb wt gain per week to a wt gain goal of 6-24 lb   Comment Pt wt is up 4 lb since admission.      Personal Goal #1 Re-Evaluation   Goal Progress Seen Yes      Psychosocial: Target Goals: Acknowledge presence or absence of significant depression and/or stress, maximize coping skills, provide positive support system. Participant is able to verbalize types and ability to use techniques and skills needed for reducing stress and depression.  Initial Review & Psychosocial Screening:     Initial Psych Review & Screening - 09/20/16 1032      Initial Review   Current issues with --  none identified     Family Dynamics   Good Support System? Yes     Barriers  Psychosocial barriers to participate in program There are no identifiable barriers or psychosocial needs.     Screening Interventions   Interventions Encouraged to exercise      Quality of Life Scores:     Quality of Life - 09/22/16 1221      Quality of Life Scores   Health/Function Pre 19.3 %   Socioeconomic Pre 24.56 %   Psych/Spiritual Pre 22.79 %   Family Pre 19.25 %   GLOBAL Pre 21.25 %      PHQ-9: Recent Review Flowsheet Data    Depression screen [facility] 2/9 09/20/2016 07/06/2016 03/22/2016 04/16/2015 10/22/2013   Decreased Interest 0 0 0 0 0   Down, Depressed, Hopeless 0 0 0 0 0   PHQ - 2 Score 0 0 0 0 0     Interpretation of Total Score  Total Score Depression Severity:  1-4 = Minimal depression, 5-9 = Mild depression, 10-14 = Moderate depression, 15-19 = Moderately severe depression, 20-27 = Severe depression   Psychosocial Evaluation and Intervention:     Psychosocial Evaluation - 09/20/16 1033      Psychosocial Evaluation & Interventions   Continue Psychosocial Services  No       Psychosocial Re-Evaluation:     Psychosocial Re-Evaluation    [place] Name 06/01/16 0801 06/28/16 1622 10/04/16 1547 11/01/16 1037 11/01/16 1216     Psychosocial Re-Evaluation   Comments no psycosocial barriers identified in the past 30 days no psycosocial barriers identified in the past 30 days no psychosocial barriers identified since admission no psychosocial barriers identified since admission  -   Expected Outcomes  -  -  -  - patient will remain free of psychosocial barriers   Interventions Encouraged to attend Pulmonary Rehabilitation for the exercise Encouraged to attend Pulmonary Rehabilitation for the exercise Encouraged to attend Pulmonary Rehabilitation for the exercise Encouraged to attend Pulmonary Rehabilitation for the exercise  -   Continue Psychosocial Services   -  -  - No Follow up required  -      Psychosocial Discharge (Final Psychosocial Re-Evaluation):     Psychosocial Re-Evaluation - 11/01/16 1216      Psychosocial Re-Evaluation   Expected Outcomes patient will remain free of psychosocial barriers      Education: Education Goals: Education classes will be provided on a weekly basis, covering required topics. Participant will state understanding/return demonstration of topics presented.  Learning Barriers/Preferences:     Learning Barriers/Preferences - 09/20/16 1023      Learning Barriers/Preferences   Learning Barriers None   Learning Preferences Written Material;Video;Verbal Instruction;Individual Instruction;Group Instruction;Pictoral      Education Topics: Risk Factor Reduction:  -Group instruction that is supported by a PowerPoint presentation. Instructor discusses the definition of a risk factor, different risk factors for pulmonary disease, and how the heart and lungs work together.     Nutrition for Pulmonary Patient:  -Group instruction provided by PowerPoint slides, verbal discussion, and written materials to support subject matter. The  instructor gives an explanation and review of healthy diet recommendations, which includes a discussion on weight management, recommendations for fruit and vegetable consumption, as well as protein, fluid, caffeine, fiber, sodium, sugar, and alcohol. Tips for eating when patients are short of breath are discussed. Flowsheet Row PULMONARY REHAB OTHER RESPIRATORY from 10/28/2016 in [place]  Date  07/01/16 [facility] Eating During the [facility]  Educator  RD  Instruction Review Code  2- meets goals/outcomes      Pursed Lip  Breathing:  -Group instruction that is supported by demonstration and informational handouts. Instructor discusses the benefits of pursed lip and diaphragmatic breathing and detailed demonstration on how to preform both.   Flowsheet Row PULMONARY REHAB OTHER RESPIRATORY from 10/28/2016 in [place]  Date  04/29/16  Educator  RT  Instruction Review Code  2- meets goals/outcomes      Oxygen Safety:  -Group instruction provided by PowerPoint, verbal discussion, and written material to support subject matter. There is an overview of "What is Oxygen" and "Why do we need it".  Instructor also reviews how to create a safe environment for oxygen use, the importance of using oxygen as prescribed, and the risks of noncompliance. There is a brief discussion on traveling with oxygen and resources the patient may utilize. Flowsheet Row PULMONARY REHAB OTHER RESPIRATORY from 10/28/2016 in [place]  Date  04/15/16  Educator  RN  Instruction Review Code  2- meets goals/outcomes      Oxygen Equipment:  -Group instruction provided by [facility] Staff utilizing handouts, written materials, and equipment demonstrations.   Signs and Symptoms:  -Group instruction provided by written material and verbal discussion to support subject matter. Warning signs and symptoms of infection, stroke, and heart  attack are reviewed and when to call the physician/911 reinforced. Tips for preventing the spread of infection discussed.   Advanced Directives:  -Group instruction provided by verbal instruction and written material to support subject matter. Instructor reviews Advanced Directive laws and proper instruction for filling out document.   Pulmonary Video:  -Group video education that reviews the importance of medication and oxygen compliance, exercise, good nutrition, pulmonary hygiene, and pursed lip and diaphragmatic breathing for the pulmonary patient. Flowsheet Row PULMONARY REHAB OTHER RESPIRATORY from 10/28/2016 in [place]  Date  10/14/16  Educator  STAFF  Instruction Review Code  2- meets goals/outcomes      Exercise for the Pulmonary Patient:  -Group instruction that is supported by a PowerPoint presentation. Instructor discusses benefits of exercise, core components of exercise, frequency, duration, and intensity of an exercise routine, importance of utilizing pulse oximetry during exercise, safety while exercising, and options of places to exercise outside of rehab.   Flowsheet Row PULMONARY REHAB OTHER RESPIRATORY from 10/28/2016 in [employer]  Date  10/07/16  Educator  EP  Instruction Review Code  2- meets goals/outcomes      Pulmonary Medications:  -Verbally interactive group education provided by instructor with focus on inhaled medications and proper administration.   Anatomy and Physiology of the Respiratory System and Intimacy:  -Group instruction provided by PowerPoint, verbal discussion, and written material to support subject matter. Instructor reviews respiratory cycle and anatomical components of the respiratory system and their functions. Instructor also reviews differences in obstructive and restrictive respiratory diseases with examples of each. Intimacy, Sex, and Sexuality differences are reviewed with  a discussion on how relationships can change when diagnosed with pulmonary disease. Common sexual concerns are reviewed. Flowsheet Row PULMONARY REHAB OTHER RESPIRATORY from 10/28/2016 in [place]  Date  10/28/16  Educator  RN  Instruction Review Code  2- meets goals/outcomes      Knowledge Questionnaire Score:     Knowledge Questionnaire Score - 09/22/16 1220      Knowledge Questionnaire Score   Pre Score 13/13      Core Components/Risk Factors/Patient Goals at Admission:  Personal Goals and Risk Factors at Admission - 09/20/16 1029      Core Components/Risk Factors/Patient Goals on Admission   Increase Strength and Stamina Yes   Improve shortness of breath with ADL's Yes      Core Components/Risk Factors/Patient Goals Review:      Goals and Risk Factor Review    Row Name 06/01/16 0801 06/28/16 1622 09/20/16 1032 10/04/16 1546 11/01/16 1036     Core Components/Risk Factors/Patient Goals Review   Personal Goals Review Increase Strength and Stamina;Improve shortness of breath with ADL's Increase Strength and Stamina;Improve shortness of breath with ADL's Increase Strength and Stamina;Improve shortness of breath with ADL's Increase Strength and Stamina;Improve shortness of breath with ADL's  -   Review see comments section on ITP see comments section on ITP  - see comments section on ITP see comments section on ITP   Expected Outcomes see admission expected outcomes see admission expected outcomes  - see admission expected outcomes see admission expected outcomes      Core Components/Risk Factors/Patient Goals at Discharge (Final Review):      Goals and Risk Factor Review - 11/01/16 1036      Core Components/Risk Factors/Patient Goals Review   Review see comments section on ITP   Expected Outcomes see admission expected outcomes      ITP Comments:   Comments: ITP REVIEW Pt is making expected progress toward pulmonary rehab  goals after completing 8 sessions. He is tolerating workload increases which is improving his stamina and strength. Recommend continued exercise, life style modification, education, and utilization of breathing techniques to increase stamina and strength and decrease shortness of breath with exertion.

## 2016-11-03 ENCOUNTER — Encounter (HOSPITAL_COMMUNITY): Payer: Self-pay | Admitting: Internal Medicine

## 2016-11-03 ENCOUNTER — Ambulatory Visit (HOSPITAL_COMMUNITY)
Admission: RE | Admit: 2016-11-03 | Discharge: 2016-11-03 | Disposition: A | Discharge status: Home / Self Care | Payer: Medicare Other | Source: Ambulatory Visit | Attending: Internal Medicine | Admitting: Internal Medicine

## 2016-11-03 VITALS — BP 138/76 | HR 63 | Wt 129.0 lb

## 2016-11-03 DIAGNOSIS — Z7982 Long term (current) use of aspirin: Secondary | ICD-10-CM | POA: Insufficient documentation

## 2016-11-03 DIAGNOSIS — I493 Ventricular premature depolarization: Secondary | ICD-10-CM

## 2016-11-03 DIAGNOSIS — Z8042 Family history of malignant neoplasm of prostate: Secondary | ICD-10-CM | POA: Diagnosis not present

## 2016-11-03 DIAGNOSIS — K219 Gastro-esophageal reflux disease without esophagitis: Secondary | ICD-10-CM | POA: Insufficient documentation

## 2016-11-03 DIAGNOSIS — K900 Celiac disease: Secondary | ICD-10-CM | POA: Insufficient documentation

## 2016-11-03 DIAGNOSIS — Z823 Family history of stroke: Secondary | ICD-10-CM | POA: Diagnosis not present

## 2016-11-03 DIAGNOSIS — E785 Hyperlipidemia, unspecified: Secondary | ICD-10-CM | POA: Insufficient documentation

## 2016-11-03 DIAGNOSIS — Z800 Family history of malignant neoplasm of digestive organs: Secondary | ICD-10-CM | POA: Diagnosis not present

## 2016-11-03 DIAGNOSIS — Z7951 Long term (current) use of inhaled steroids: Secondary | ICD-10-CM | POA: Diagnosis not present

## 2016-11-03 DIAGNOSIS — Z86010 Personal history of colonic polyps: Secondary | ICD-10-CM | POA: Insufficient documentation

## 2016-11-03 DIAGNOSIS — I110 Hypertensive heart disease with heart failure: Secondary | ICD-10-CM | POA: Diagnosis not present

## 2016-11-03 DIAGNOSIS — Z87891 Personal history of nicotine dependence: Secondary | ICD-10-CM | POA: Diagnosis not present

## 2016-11-03 DIAGNOSIS — Z9981 Dependence on supplemental oxygen: Secondary | ICD-10-CM | POA: Insufficient documentation

## 2016-11-03 DIAGNOSIS — Z87442 Personal history of urinary calculi: Secondary | ICD-10-CM | POA: Diagnosis not present

## 2016-11-03 DIAGNOSIS — J449 Chronic obstructive pulmonary disease, unspecified: Secondary | ICD-10-CM | POA: Insufficient documentation

## 2016-11-03 DIAGNOSIS — J9610 Chronic respiratory failure, unspecified whether with hypoxia or hypercapnia: Secondary | ICD-10-CM | POA: Insufficient documentation

## 2016-11-03 DIAGNOSIS — I2510 Atherosclerotic heart disease of native coronary artery without angina pectoris: Secondary | ICD-10-CM | POA: Diagnosis not present

## 2016-11-03 DIAGNOSIS — Z8249 Family history of ischemic heart disease and other diseases of the circulatory system: Secondary | ICD-10-CM | POA: Diagnosis not present

## 2016-11-03 DIAGNOSIS — Z79899 Other long term (current) drug therapy: Secondary | ICD-10-CM | POA: Insufficient documentation

## 2016-11-03 DIAGNOSIS — I252 Old myocardial infarction: Secondary | ICD-10-CM | POA: Insufficient documentation

## 2016-11-03 DIAGNOSIS — Z885 Allergy status to narcotic agent status: Secondary | ICD-10-CM | POA: Diagnosis not present

## 2016-11-03 DIAGNOSIS — I5022 Chronic systolic (congestive) heart failure: Secondary | ICD-10-CM | POA: Diagnosis not present

## 2016-11-03 DIAGNOSIS — I428 Other cardiomyopathies: Secondary | ICD-10-CM | POA: Insufficient documentation

## 2016-11-03 DIAGNOSIS — Z8049 Family history of malignant neoplasm of other genital organs: Secondary | ICD-10-CM | POA: Insufficient documentation

## 2016-11-03 LAB — COMPREHENSIVE METABOLIC PANEL
ALK PHOS: 53 U/L (ref 38–126)
ALT: 25 U/L (ref 17–63)
AST: 22 U/L (ref 15–41)
Albumin: 3.8 g/dL (ref 3.5–5.0)
Anion gap: 7 (ref 5–15)
BILIRUBIN TOTAL: 0.9 mg/dL (ref 0.3–1.2)
BUN: 15 mg/dL (ref 6–20)
CALCIUM: 9.5 mg/dL (ref 8.9–10.3)
CO2: 26 mmol/L (ref 22–32)
CREATININE: 1.23 mg/dL (ref 0.61–1.24)
Chloride: 107 mmol/L (ref 101–111)
GFR calc Af Amer: >60 mL/min (ref >60)
GFR calc non Af Amer: 54 mL/min — ABNORMAL LOW (ref >60)
GLUCOSE: 64 mg/dL — AB (ref 65–99)
Potassium: 4.3 mmol/L (ref 3.5–5.1)
SODIUM: 140 mmol/L (ref 135–145)
TOTAL PROTEIN: 6.1 g/dL — AB (ref 6.5–8.1)

## 2016-11-03 LAB — T4, FREE: Free T4: 1.11 ng/dL (ref 0.61–1.12)

## 2016-11-03 LAB — TSH: TSH: 4.982 u[IU]/mL — ABNORMAL HIGH (ref 0.350–4.500)

## 2016-11-03 NOTE — Addendum Note (Signed)
Encounter addended by: Jolaine Artist, MD on: 11/03/2016 10:54 AM<BR>    Actions taken: Sign clinical note

## 2016-11-03 NOTE — Progress Notes (Addendum)
Patient ID: Bruce Travis, male   DOB: December 10, 1934, 82 y.o.   MRN: [id number]   ADVANCED HF CLINIC NOTE  Chief Complaint:  PAC's and PVC's, abnormal ekg, DOE  Primary Care Physician: Odette Fraction, MD  HPI:  Bruce Travis is a 82 y.o. male with COPD (O2 dependent). HTN, HL and systolic HF fleto to be related to frequent PVCs.   Admitted in June 2017 with acute HF. Echo with EF 25%. Cath twith essentially normal coronaries. EF 20-25%. Low filling pressures but CO 2.5/1.5. Started on milrinone. 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 Started on amiodarone. PVCs went from ~20/minute to 1-2/minute by time of discharge. Milrinone weaned off.   Echo 04/12/16 LVEF 50-55%, Trivial AI, Mild Bruce, Mild LAE, PA peak pressure 46 mm Hg - much improved from previous.   Admitted 12/18 with PNA.   Bruce Travis presents today for regular follow up. At last visit amio decreased to 100 daily. Feeling good overall. Back in cardiac/pulmoanry rehab. Riding bike and walking. On other days goes to [place] and walks for 6-7 rounds. Good/daysand bad days with breathing depending on the weather. Weight at home 126-127 still.  Denies lightheadedness or dizziness. No edema, orthopnea or PND. No palpitations.   ECG: NSR 72 with RBBB and PACs. No PVCs  PMHx:  Past Medical History:  Diagnosis Date  . Allergic rhinitis   . Bronchiectasis   . CAD (coronary artery disease)   . Celiac disease   . Chronic heart failure ([place])   . Chronic kidney disease (CKD), stage III (moderate)   . COPD (chronic obstructive pulmonary disease) ([place])   . Diverticulosis of colon (without mention of hemorrhage)   . Esophageal reflux   . Family history of adverse reaction to anesthesia    daughter gets PONV  . Family hx of colon cancer   . History of stomach ulcers 1980s  . Hyperlipemia   . Hypertension   . 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 Myocardial infarction    "previous MI/echo in 12/2015"  . Nephrolithiasis    "got them now; never had OR/scopes" (02/03/2016)  . On home oxygen therapy    "2L usually; 3L last 3-4 days; 24/7" (08/06/2016)  . Other diseases of lung, not elsewhere classified   . Personal history of colonic polyps 04/26/2007   hyperplastic   . Pneumonia "several times"    Past Surgical History:  Procedure Laterality Date  . CARDIAC CATHETERIZATION  02/03/2016  . CARDIAC CATHETERIZATION  09/30/2009   non-obstructive CAD w/30% narrowing in prox LAD (Dr. Waunita Schooner)  . CARDIAC CATHETERIZATION  05/04/2001   same at 2011 cath (Dr. Waunita Schooner)  . CARDIAC CATHETERIZATION N/A 02/03/2016   Procedure: Right/Left Heart Cath and Coronary Angiography;  Surgeon: Pixie Casino, MD;  Location: [facility];  Service: Cardiovascular;  Laterality: N/A;  . EPIGLOTOPLASTY W/ MLB     removal due to carcinoma  . EXCISIONAL HEMORRHOIDECTOMY  2000s  . HAND SURGERY Right 1958   d/t crush injury  . TONSILLECTOMY AND ADENOIDECTOMY    . ULTRASOUND GUIDANCE FOR VASCULAR ACCESS  02/03/2016   Procedure: Ultrasound Guidance For Vascular Access;  Surgeon: Pixie Casino, MD;  Location: [facility];  Service: Cardiovascular;;  . VASECTOMY      FAMHx:  Family History  Problem Relation Age of Onset  . Heart disease Father   . Colon cancer Father   . Prostate cancer Father   .  Stroke Mother   . Endometrial cancer Sister   . Stroke Maternal Grandmother   . Cancer Paternal Grandmother     SOCHx:   reports that he quit smoking about 27 years ago. His smoking use included Cigarettes. He has a 70.00 pack-year smoking history. He quit smokeless tobacco use about 15 years ago. His smokeless tobacco use included Chew. He reports that he does not drink alcohol or use drugs.  ALLERGIES:  Allergies  Allergen Reactions  . Adhesive [Tape] Other (See Comments)    Band-aids - tears skin off  . Codeine Other (See Comments)    Thought head was going to blow off  . Gluten Meal Other (See Comments)     Celiac disease    ROS: Pertinent items noted in HPI and remainder of comprehensive ROS otherwise negative.  HOME MEDS: Current Outpatient Prescriptions  Medication Sig Dispense Refill  . albuterol (PROAIR HFA) 108 (90 Base) MCG/ACT inhaler INHALE 2 PUFFS EVERY 4 HOURS AS NEEDED FOR SHORTNESS OF BREATH. 8.5 g 5  . AMBULATORY NON FORMULARY MEDICATION O2 2 Lpm continuous    . amiodarone (PACERONE) 200 MG tablet Take 0.5 tablets (100 mg total) by mouth daily. 30 tablet 6  . aspirin EC 81 MG tablet Take 81 mg by mouth daily.    Marland Kitchen azelastine (ASTELIN) 0.1 % nasal spray PLACE 2 SPRAYS INTO EACH NOSTRILS TWICE DAILY AS DIRECTED. 30 mL 0  . Calcium Carbonate-Vitamin D (CALTRATE 600+D) 600-400 MG-UNIT tablet Take 1 tablet by mouth daily.    Marland Kitchen ENTRESTO 49-51 MG TAKE (1) TABLET BY MOUTH TWICE DAILY. 60 tablet 6  . montelukast (SINGULAIR) 10 MG tablet TAKE 1 TABLET BY MOUTH DAILY. 90 tablet 0  . Multiple Vitamin (MULTIVITAMIN WITH MINERALS) TABS tablet Take 1 tablet by mouth daily.    . pantoprazole (PROTONIX) 40 MG tablet TAKE ONE TABLET BY MOUTH DAILY. 30 tablet 0  . polyethylene glycol (MIRALAX / GLYCOLAX) packet Take 17 g by mouth daily as needed for mild constipation.     Marland Kitchen SPIRIVA HANDIHALER 18 MCG inhalation capsule INHALE 1 CAPSULE ONCE DAILY AS DIRECTED 30 capsule 11  . spironolactone (ALDACTONE) 25 MG tablet Take 0.5 tablets (12.5 mg total) by mouth daily. 16 tablet 6  . SYMBICORT 160-4.5 MCG/ACT inhaler INHALE 2 PUFFS FIRST THING IN MORNING AND THEN ANOTHER 2 PUFFS ABOUT 12 HOURS LATER. 10.2 g 3  . HYDROcodone-homatropine (HYCODAN) 5-1.5 MG/5ML syrup Take 5 mLs by mouth every 8 (eight) hours as needed for cough. (Patient not taking: Reported on 11/03/2016) 120 mL 0   No current facility-administered medications for this encounter.     LABS/IMAGING: No results found for this or any previous visit (from the past 48 hour(s)). No results found.  WEIGHTS: Wt Readings from Last 3 Encounters:   11/03/16 129 lb (58.5 kg)  11/02/16 127 lb 13.9 oz (58 kg)  10/28/16 127 lb 13.9 oz (58 kg)    VITALS: BP 138/76   Pulse 63   Wt 129 lb (58.5 kg)   SpO2 93% Comment: on 2L of O2  BMI 20.51 kg/m   Wt Readings from Last 3 Encounters:  11/03/16 129 lb (58.5 kg)  11/02/16 127 lb 13.9 oz (58 kg)  10/28/16 127 lb 13.9 oz (58 kg)     EXAM: General: Elderly. Thin. NAD. O2 via [place]. Wife present.  HEENT: Normal Neck: supple. JVD flat. Carotids 2+ bilat; no bruits. No thyromegaly or nodule noted.  Cor: PMI nonpalpable. Distant. Regular. No obvious  ectopy.  Lungs: Diminished throughout. No wheezing.  Abdomen: soft, NT, ND, no HSM. No bruits or masses. +BS  Extremities: no cyanosis, clubbing, rash. No peripheral edema.  Neuro: alert & orientedx3, cranial nerves grossly intact. moves all 4 extremities w/o difficulty. Affect pleasant   ASSESSMENT: 1. Chronic systolic HF EF 83%. EF now normal by Echo 04/12/16 LVEF 50-55%, Trivial AI, Mild Bruce, Mild LAE, PA peak pressure 46 mm Hg   - likely due to PVC cardiomyopathy     - EF improved with PVC suppression on amio    - NYHA II-III. Stable. Volume status ok.    -Continue Entresto 49/51 and spiro 12.5 mg daily.  Will not uptitrate with age and borderline hyperkalemia.     -No b-blocker with severe COPD   - Will see back in 6 months and repeat echo to ensure LV stability  2. PVC related cardiomyopathy   -PVCs suppressed with amio with normalization of LV function. amio decreased to 100 daily at last visit. Will continue   -check TSH, LFTs. Recent eye exam 1/18 was ok.  3. Chronic respiratory failure due to COPD on home O2 4. HTN    -Blood pressure well controlled. Continue current regimen.   Glori Bickers MD 11/03/2016, 10:42 AM

## 2016-11-03 NOTE — Addendum Note (Signed)
Encounter addended by: Kennieth Rad, RN on: 11/03/2016 11:04 AM<BR>    Actions taken: Order list changed, Diagnosis association updated, Sign clinical note

## 2016-11-03 NOTE — Patient Instructions (Signed)
Labs today (will call for abnormal results, otherwise no news is good news)  Follow up and Echo in 6 Months

## 2016-11-04 ENCOUNTER — Encounter (HOSPITAL_COMMUNITY)
Admission: RE | Admit: 2016-11-04 | Discharge: 2016-11-04 | Disposition: A | Discharge status: Home / Self Care | Payer: Medicare Other | Source: Ambulatory Visit | Attending: Internal Medicine | Admitting: Internal Medicine

## 2016-11-04 DIAGNOSIS — I5023 Acute on chronic systolic (congestive) heart failure: Secondary | ICD-10-CM

## 2016-11-04 DIAGNOSIS — I5021 Acute systolic (congestive) heart failure: Secondary | ICD-10-CM | POA: Diagnosis not present

## 2016-11-04 NOTE — Progress Notes (Signed)
Daily Session Note  Patient Details  Name: Bruce Travis MRN: [id number] Date of Birth: 12-15-1934 Referring Provider:     Pulmonary Rehab Walk Test from 09/23/2016 in [place]  Referring Provider  Dr. Nelda Marseille      Encounter Date: 11/04/2016  Check In:     Session Check In - 11/04/16 1014      Check-In   Location [facility]   Staff Present Su Hilt, MS, ACSM RCEP, Exercise Physiologist;Joan Leonia Reeves, RN, Luisa Hart, RN, Roque Cash, RN   Supervising physician immediately available to respond to emergencies See telemetry face sheet for immediately available MD   Physician(s) Dr. Sloan Leiter   Medication changes reported     No   Fall or balance concerns reported    No   Tobacco Cessation No Change   Warm-up and Cool-down Performed as group-led instruction   Resistance Training Performed Yes   VAD Patient? No     Pain Assessment   Currently in Pain? No/denies   Multiple Pain Sites No      Capillary Blood Glucose: No results found for this or any previous visit (from the past 24 hour(s)).      Exercise Prescription Changes - 11/04/16 1200      Response to Exercise   Blood Pressure (Admit) 104/60   Blood Pressure (Exercise) 110/70   Blood Pressure (Exit) 120/70   Heart Rate (Admit) 71 bpm   Heart Rate (Exercise) 81 bpm   Heart Rate (Exit) 66 bpm   Oxygen Saturation (Admit) 94 %   Oxygen Saturation (Exercise) 82 %  4 liters on track-increased to 6l-92%   Oxygen Saturation (Exit) 94 %   Rating of Perceived Exertion (Exercise) 13   Perceived Dyspnea (Exercise) 2   Duration Continue with 45 min of aerobic exercise without signs/symptoms of physical distress.   Intensity THRR unchanged     Progression   Progression Continue to progress workloads to maintain intensity without signs/symptoms of physical distress.     Resistance Training   Training Prescription Yes   Weight orange bands   Reps 10-15   Time  10 Minutes     Interval Training   Interval Training No     Oxygen   Oxygen Continuous   Liters 3     NuStep   Level 4   Minutes 17   METs 2.2     Track   Laps 12   Minutes 17      History  Smoking Status  . Former Smoker  . Packs/day: 2.00  . Years: 35.00  . Types: Cigarettes  . Quit date: 01/21/1989  Smokeless Tobacco  . Former Systems developer  . Types: Chew  . Quit date: 08/23/2001    Goals Met:  Exercise tolerated well No report of cardiac concerns or symptoms Strength training completed today  Goals Unmet:  Not Applicable  Comments: Service time is from 10:30AM to 12:45P    Dr. Rush Farmer is Medical Director for Pulmonary Rehab at [facility].

## 2016-11-09 ENCOUNTER — Other Ambulatory Visit: Payer: Self-pay | Admitting: Family Medicine

## 2016-11-09 ENCOUNTER — Encounter (HOSPITAL_COMMUNITY)
Admission: RE | Admit: 2016-11-09 | Discharge: 2016-11-09 | Disposition: A | Discharge status: Home / Self Care | Payer: Medicare Other | Source: Ambulatory Visit | Attending: Internal Medicine | Admitting: Internal Medicine

## 2016-11-09 VITALS — Wt 128.7 lb

## 2016-11-09 DIAGNOSIS — I5023 Acute on chronic systolic (congestive) heart failure: Secondary | ICD-10-CM

## 2016-11-09 DIAGNOSIS — J449 Chronic obstructive pulmonary disease, unspecified: Secondary | ICD-10-CM

## 2016-11-09 DIAGNOSIS — I5021 Acute systolic (congestive) heart failure: Secondary | ICD-10-CM | POA: Diagnosis not present

## 2016-11-09 NOTE — Progress Notes (Signed)
Daily Session Note  Patient Details  Name: Bruce Travis MRN: [id number] Date of Birth: [birth year] Referring Provider:     Pulmonary Rehab Walk Test from 09/23/2016 in [place]  Referring Provider  Dr. Nelda Marseille      Encounter Date: 11/09/2016  Check In:     Session Check In - 11/09/16 1210      Check-In   Location [facility]   Staff Present Su Hilt, MS, ACSM RCEP, Exercise Physiologist;Joan Leonia Reeves, RN, BSN;Lisa Ysidro Evert, RN;Portia Rollene Rotunda, RN, BSN   Supervising physician immediately available to respond to emergencies [facility] immediately available   Physician(s) Dr. Candiss Norse   Medication changes reported     No   Fall or balance concerns reported    No   Tobacco Cessation No Change   Warm-up and Cool-down Performed as group-led instruction   Resistance Training Performed Yes   VAD Patient? No     Pain Assessment   Currently in Pain? No/denies   Multiple Pain Sites No      Capillary Blood Glucose: No results found for this or any previous visit (from the past 24 hour(s)).      Exercise Prescription Changes - 11/09/16 1200      Response to Exercise   Blood Pressure (Admit) 112/72   Blood Pressure (Exercise) 120/64   Blood Pressure (Exit) 108/70   Heart Rate (Admit) 61 bpm   Heart Rate (Exercise) 87 bpm   Heart Rate (Exit) 76 bpm   Oxygen Saturation (Admit) 94 %   Oxygen Saturation (Exercise) 89 %   Oxygen Saturation (Exit) 97 %   Rating of Perceived Exertion (Exercise) 13   Perceived Dyspnea (Exercise) 2   Duration Continue with 45 min of aerobic exercise without signs/symptoms of physical distress.   Intensity THRR unchanged     Progression   Progression Continue to progress workloads to maintain intensity without signs/symptoms of physical distress.     Resistance Training   Training Prescription Yes   Weight orange bands   Reps 10-15   Time 10 Minutes     Interval Training   Interval Training  No     Oxygen   Oxygen Continuous   Liters 3     Bike   Level 4   Minutes 17     NuStep   Level 4   Minutes 17   METs 2.2     Track   Laps 14   Minutes 17      History  Smoking Status  . Former Smoker  . Packs/day: 2.00  . Years: 35.00  . Types: Cigarettes  . Quit date: 01/21/1989  Smokeless Tobacco  . Former Systems developer  . Types: Chew  . Quit date: 08/23/2001    Goals Met:  Exercise tolerated well No report of cardiac concerns or symptoms Strength training completed today  Goals Unmet:  Not Applicable  Comments: Service time is from 10:30a to 12:00p    Dr. Rush Farmer is Medical Director for Pulmonary Rehab at [facility].

## 2016-11-10 ENCOUNTER — Other Ambulatory Visit: Payer: Self-pay | Admitting: Family Medicine

## 2016-11-10 DIAGNOSIS — J441 Chronic obstructive pulmonary disease with (acute) exacerbation: Secondary | ICD-10-CM

## 2016-11-10 MED ORDER — ALBUTEROL SULFATE HFA 108 (90 BASE) MCG/ACT IN AERS: INHALATION_SPRAY | RESPIRATORY_TRACT | 5 refills | Status: DC

## 2016-11-11 ENCOUNTER — Encounter (HOSPITAL_COMMUNITY)
Admission: RE | Admit: 2016-11-11 | Discharge: 2016-11-11 | Disposition: A | Discharge status: Home / Self Care | Payer: Medicare Other | Source: Ambulatory Visit | Attending: Internal Medicine | Admitting: Internal Medicine

## 2016-11-11 VITALS — Wt 128.5 lb

## 2016-11-11 DIAGNOSIS — I5023 Acute on chronic systolic (congestive) heart failure: Secondary | ICD-10-CM

## 2016-11-11 DIAGNOSIS — I5021 Acute systolic (congestive) heart failure: Secondary | ICD-10-CM | POA: Diagnosis not present

## 2016-11-11 NOTE — Progress Notes (Signed)
Daily Session Note  Patient Details  Name: Bruce Travis MRN: [id number] Date of Birth: 1934/11/24 Referring Provider:     Pulmonary Rehab Walk Test from 09/23/2016 in [place]  Referring Provider  Dr. Nelda Marseille      Encounter Date: 11/11/2016  Check In:     Session Check In - 11/11/16 1030      Check-In   Location [facility]   Staff Present Su Hilt, MS, ACSM RCEP, Exercise Physiologist;Lisa Ysidro Evert, RN;Portia Rollene Rotunda, RN, BSN   Supervising physician immediately available to respond to emergencies [facility] immediately available   Physician(s) Dr. Allyson Sabal   Medication changes reported     No   Fall or balance concerns reported    No   Tobacco Cessation No Change   Warm-up and Cool-down Performed as group-led instruction   Resistance Training Performed Yes   VAD Patient? No     Pain Assessment   Currently in Pain? No/denies   Multiple Pain Sites No      Capillary Blood Glucose: No results found for this or any previous visit (from the past 24 hour(s)).      Exercise Prescription Changes - 11/11/16 1200      Response to Exercise   Blood Pressure (Admit) 118/70   Blood Pressure (Exercise) 122/70   Blood Pressure (Exit) 102/58   Heart Rate (Admit) 68 bpm   Heart Rate (Exercise) 73 bpm   Heart Rate (Exit) 67 bpm   Oxygen Saturation (Admit) 94 %   Oxygen Saturation (Exercise) 93 %   Oxygen Saturation (Exit) 96 %   Rating of Perceived Exertion (Exercise) 13   Perceived Dyspnea (Exercise) 2   Duration Continue with 45 min of aerobic exercise without signs/symptoms of physical distress.   Intensity THRR unchanged     Progression   Progression Continue to progress workloads to maintain intensity without signs/symptoms of physical distress.     Resistance Training   Training Prescription Yes   Weight orange bands   Reps 10-15   Time 10 Minutes     Interval Training   Interval Training No     Oxygen   Oxygen Continuous   Liters 3     Bike   Level 4   Minutes 17     NuStep   Level 4   Minutes 17   METs 2.2      History  Smoking Status  . Former Smoker  . Packs/day: 2.00  . Years: 35.00  . Types: Cigarettes  . Quit date: 01/21/1989  Smokeless Tobacco  . Former Systems developer  . Types: Chew  . Quit date: 08/23/2001    Goals Met:  Exercise tolerated well No report of cardiac concerns or symptoms Strength training completed today  Goals Unmet:  Not Applicable  Comments: Service time is from 10:30a to 12:20p    Dr. Rush Farmer is Medical Director for Pulmonary Rehab at [facility].

## 2016-11-16 ENCOUNTER — Telehealth: Payer: Self-pay | Admitting: Family Medicine

## 2016-11-16 ENCOUNTER — Encounter (HOSPITAL_COMMUNITY)
Admission: RE | Admit: 2016-11-16 | Discharge: 2016-11-16 | Disposition: A | Discharge status: Home / Self Care | Payer: Medicare Other | Source: Ambulatory Visit | Attending: Internal Medicine | Admitting: Internal Medicine

## 2016-11-16 VITALS — Wt 130.1 lb

## 2016-11-16 DIAGNOSIS — I5021 Acute systolic (congestive) heart failure: Secondary | ICD-10-CM

## 2016-11-16 DIAGNOSIS — I5023 Acute on chronic systolic (congestive) heart failure: Secondary | ICD-10-CM

## 2016-11-16 NOTE — Telephone Encounter (Signed)
Cloyde Reams called stating pt needs a POC . Please reference her in basket message for more details  CB# [phone]

## 2016-11-16 NOTE — Progress Notes (Signed)
Daily Session Note  Patient Details  Name: Bruce Travis MRN: [id number] Date of Birth: 17-Sep-1934 Referring Provider:     Pulmonary Rehab Walk Test from 09/23/2016 in [place]  Referring Provider  Dr. Nelda Marseille      Encounter Date: 11/16/2016  Check In:     Session Check In - 11/16/16 1030      Check-In   [facility]   Staff Present Rosebud Poles, RN, BSN;Rasa Degrazia, MS, ACSM RCEP, Exercise Physiologist;Annedrea Rosezella [place], RN, MHA;Portia Rollene Rotunda, RN, BSN   Supervising physician immediately available to respond to emergencies [facility] immediately available   Physician(s) Dr. Tana Coast   Medication changes reported     No   Fall or balance concerns reported    No   Tobacco Cessation No Change   Warm-up and Cool-down Performed as group-led instruction   Resistance Training Performed Yes   VAD Patient? No     Pain Assessment   Currently in Pain? No/denies   Multiple Pain Sites No      Capillary Blood Glucose: No results found for this or any previous visit (from the past 24 hour(s)).      Exercise Prescription Changes - 11/16/16 1200      Response to Exercise   Blood Pressure (Admit) 110/64   Blood Pressure (Exercise) 126/54   Blood Pressure (Exit) 126/74   Heart Rate (Admit) 72 bpm   Heart Rate (Exercise) 85 bpm   Heart Rate (Exit) 76 bpm   Oxygen Saturation (Admit) 94 %   Oxygen Saturation (Exercise) 89 %   Oxygen Saturation (Exit) 95 %   Rating of Perceived Exertion (Exercise) 13   Perceived Dyspnea (Exercise) 2   Duration Continue with 45 min of aerobic exercise without signs/symptoms of physical distress.   Intensity THRR unchanged     Progression   Progression Continue to progress workloads to maintain intensity without signs/symptoms of physical distress.     Resistance Training   Training Prescription Yes   Weight orange bands   Reps 10-15   Time 10 Minutes     Interval Training   Interval  Training No     Oxygen   Oxygen Continuous   Liters 3     Bike   Level 4   Minutes 17     NuStep   Level 4   Minutes 17   METs 2.3     Track   Laps 14   Minutes 17      History  Smoking Status  . Former Smoker  . Packs/day: 2.00  . Years: 35.00  . Types: Cigarettes  . Quit date: 01/21/1989  Smokeless Tobacco  . Former Systems developer  . Types: Chew  . Quit date: 08/23/2001    Goals Met:  Exercise tolerated well No report of cardiac concerns or symptoms Strength training completed today  Goals Unmet:  Not Applicable  Comments: Service time is from 10:30a to 12:05p    Dr. Rush Farmer is Medical Director for Pulmonary Rehab at [facility].

## 2016-11-16 NOTE — Telephone Encounter (Signed)
I remember signing a Rx for this, hasn't this already been done?

## 2016-11-17 ENCOUNTER — Telehealth: Payer: Self-pay | Admitting: Family Medicine

## 2016-11-17 NOTE — Telephone Encounter (Signed)
See phone note 11/17/16

## 2016-11-17 NOTE — Telephone Encounter (Signed)
Orders were faxed over on 11/09/16 - spoke to pt and no one has contacted him yet about it. He has appt tomorrow to discuss with Dr. Dennard Schaumann and will follow up with [facility] to make sure they received the order. Spoke to [place] and he just spoke to pt 30 minutes ago and they did receive the order but he had to order the concentrator to go to 5 L and it is at his office and pt will receive today.

## 2016-11-17 NOTE — Telephone Encounter (Signed)
-----   Message from Susy Frizzle, MD sent at 11/09/2016  6:42 AM EDT ----- Regarding: RE: Pulmonary Rehab Can we send order to Nichola Sizer at [facility] for portable oxygen concentrator for up to 5 L of oxygen via nasal cannula with activity as needed. ----- Message ----- From: Feliz Beam Divincenzo, RCEP Sent: 11/05/2016   9:15 AM To: Susy Frizzle, MD Subject: Pulmonary Rehab                                Dr. Dennard Schaumann,  The above patient is currently enrolled in our Pulmonary Rehab. While exercising at Pulmonary Rehab, [name] consistently uses 4 liters of continuous flow oxygen. This is provided by [facility]. At home, the patient currently uses a POC for their home oxygen system. While exercising at home, the patient was instructed to use 3 liters pulsed. In Pulmonary Rehab today, Bruce Travis walked the track utilizing their own home oxygen system. They used 3 liters pulsed which maintained their oxygen saturation at 87%. The patient's POC highest flow is 3 liters pulsed.  I have spoken with Nichola Sizer at [facility] and he states that he has a POC that is capable of 5 liters pulsed. [name] has an active lifestyle and I think that a POC would suit him better than tanks--of course, only if the POC can keep his oxygen saturations above 90%. If you agree with my assessment, do you mind placing this order? If you have any questions regarding the equipment, Nichola Sizer at [place] would be able to assist. He was very helpful--his number is [phone].   Let me know if you have any questions! Molly diVincenzo MS, ACSM RCEP

## 2016-11-18 ENCOUNTER — Encounter (HOSPITAL_COMMUNITY)
Admission: RE | Admit: 2016-11-18 | Discharge: 2016-11-18 | Disposition: A | Discharge status: Home / Self Care | Payer: Medicare Other | Source: Ambulatory Visit | Attending: Internal Medicine | Admitting: Internal Medicine

## 2016-11-18 ENCOUNTER — Encounter: Payer: Medicare Other | Admitting: Family Medicine

## 2016-11-18 ENCOUNTER — Encounter: Payer: Self-pay | Admitting: Family Medicine

## 2016-11-18 VITALS — Wt 128.7 lb

## 2016-11-18 DIAGNOSIS — I5021 Acute systolic (congestive) heart failure: Secondary | ICD-10-CM | POA: Diagnosis not present

## 2016-11-18 DIAGNOSIS — I5023 Acute on chronic systolic (congestive) heart failure: Secondary | ICD-10-CM

## 2016-11-18 NOTE — Progress Notes (Signed)
Daily Session Note  Patient Details  Name: Bruce Travis MRN: [id number] Date of Birth: 1935/06/07 Referring Provider:     Pulmonary Rehab Walk Test from 09/23/2016 in [place]  Referring Provider  Dr. Nelda Marseille      Encounter Date: 11/18/2016  Check In:     Session Check In - 11/18/16 1005      Check-In   Location [facility]   Staff Present Su Hilt, MS, ACSM RCEP, Exercise Physiologist;Joan Leonia Reeves, RN, Roque Cash, RN   Supervising physician immediately available to respond to emergencies [facility] immediately available   Physician(s) Dr. Sloan Leiter   Medication changes reported     No   Fall or balance concerns reported    No   Tobacco Cessation No Change   Warm-up and Cool-down Performed as group-led instruction   Resistance Training Performed Yes   VAD Patient? No     Pain Assessment   Currently in Pain? No/denies   Multiple Pain Sites No      Capillary Blood Glucose: No results found for this or any previous visit (from the past 24 hour(s)).      Exercise Prescription Changes - 11/18/16 1300      Response to Exercise   Blood Pressure (Admit) 100/60   Blood Pressure (Exercise) 124/68   Blood Pressure (Exit) 110/70   Heart Rate (Admit) 68 bpm   Heart Rate (Exercise) 100 bpm   Heart Rate (Exit) 73 bpm   Oxygen Saturation (Admit) 92 %   Oxygen Saturation (Exercise) 92 %   Oxygen Saturation (Exit) 92 %   Rating of Perceived Exertion (Exercise) 13   Perceived Dyspnea (Exercise) 2   Duration Continue with 45 min of aerobic exercise without signs/symptoms of physical distress.   Intensity THRR unchanged     Progression   Progression Continue to progress workloads to maintain intensity without signs/symptoms of physical distress.     Resistance Training   Training Prescription Yes   Weight orange bands   Reps 10-15   Time 10 Minutes     Interval Training   Interval Training No     Oxygen   Oxygen Continuous   Liters 3     Bike   Level 4   Minutes 17     Track   Laps 14   Minutes 17      History  Smoking Status  . Former Smoker  . Packs/day: 2.00  . Years: 35.00  . Types: Cigarettes  . Quit date: 01/21/1989  Smokeless Tobacco  . Former Systems developer  . Types: Chew  . Quit date: 08/23/2001    Goals Met:  Exercise tolerated well No report of cardiac concerns or symptoms Strength training completed today  Goals Unmet:  Not Applicable  Comments: Service time is from 10:30a to 12:30p    Dr. Rush Farmer is Medical Director for Pulmonary Rehab at [facility]

## 2016-11-18 NOTE — Progress Notes (Signed)
This encounter was created in error - please disregard.

## 2016-11-23 ENCOUNTER — Encounter (HOSPITAL_COMMUNITY)
Admission: RE | Admit: 2016-11-23 | Discharge: 2016-11-23 | Disposition: A | Discharge status: Home / Self Care | Payer: Medicare Other | Source: Ambulatory Visit | Attending: Internal Medicine | Admitting: Internal Medicine

## 2016-11-23 VITALS — Wt 130.1 lb

## 2016-11-23 DIAGNOSIS — I5021 Acute systolic (congestive) heart failure: Secondary | ICD-10-CM | POA: Diagnosis present

## 2016-11-23 DIAGNOSIS — I5023 Acute on chronic systolic (congestive) heart failure: Secondary | ICD-10-CM

## 2016-11-23 NOTE — Progress Notes (Signed)
Daily Session Note  Patient Details  Name: Bruce Travis MRN: [id number] Date of Birth: 1935/02/13 Referring Provider:     Pulmonary Rehab Walk Test from 09/23/2016 in [place]  Referring Provider  Dr. Nelda Marseille      Encounter Date: 11/23/2016  Check In:     Session Check In - 11/23/16 1056      Check-In   Location [facility]   Staff Present Su Hilt, MS, ACSM RCEP, Exercise Physiologist;Lisa Ysidro Evert, RN;Richey Doolittle [place], RN, BSN;Joann Rion, RN, BSN   Supervising physician immediately available to respond to emergencies [facility] immediately available   Physician(s) Dr. Doyle Askew   Medication changes reported     No   Fall or balance concerns reported    No   Tobacco Cessation No Change   Warm-up and Cool-down Performed as group-led instruction   Resistance Training Performed Yes   VAD Patient? No     Pain Assessment   Currently in Pain? No/denies   Multiple Pain Sites No      Capillary Blood Glucose: No results found for this or any previous visit (from the past 24 hour(s)).      Exercise Prescription Changes - 11/23/16 1212      Response to Exercise   Blood Pressure (Admit) 104/62   Blood Pressure (Exercise) 120/54   Blood Pressure (Exit) 112/82   Heart Rate (Admit) 71 bpm   Heart Rate (Exercise) 81 bpm   Heart Rate (Exit) 68 bpm   Oxygen Saturation (Admit) 93 %   Oxygen Saturation (Exercise) 86 %  increased to 91 with rest   Oxygen Saturation (Exit) 96 %   Rating of Perceived Exertion (Exercise) 13   Perceived Dyspnea (Exercise) 2   Duration Continue with 45 min of aerobic exercise without signs/symptoms of physical distress.   Intensity THRR unchanged     Progression   Progression Continue to progress workloads to maintain intensity without signs/symptoms of physical distress.     Resistance Training   Training Prescription Yes   Weight orange bands   Reps 10-15   Time 10 Minutes     Interval  Training   Interval Training No     Oxygen   Oxygen Continuous   Liters 6     Bike   Level 4   Minutes 17     NuStep   Level 4   Minutes 17   METs 2.3     Track   Laps 14   Minutes 17      History  Smoking Status  . Former Smoker  . Packs/day: 2.00  . Years: 35.00  . Types: Cigarettes  . Quit date: 01/21/1989  Smokeless Tobacco  . Former Systems developer  . Types: Chew  . Quit date: 08/23/2001    Goals Met:  Independence with exercise equipment Improved SOB with ADL's Using PLB without cueing & demonstrates good technique Exercise tolerated well No report of cardiac concerns or symptoms Strength training completed today  Goals Unmet:  O2 Sat, patient encouraged to pace himself on the track and take rest breaks as needed to prevent desaturations below 88%  Comments: Service time is from 1030 to 1200   Dr. Rush Farmer is Medical Director for Pulmonary Rehab at [facility].

## 2016-11-25 ENCOUNTER — Other Ambulatory Visit: Payer: Self-pay | Admitting: Family Medicine

## 2016-11-25 ENCOUNTER — Encounter (HOSPITAL_COMMUNITY)
Admission: RE | Admit: 2016-11-25 | Discharge: 2016-11-25 | Disposition: A | Discharge status: Home / Self Care | Payer: Medicare Other | Source: Ambulatory Visit | Attending: Internal Medicine | Admitting: Internal Medicine

## 2016-11-25 VITALS — Wt 131.0 lb

## 2016-11-25 DIAGNOSIS — I5021 Acute systolic (congestive) heart failure: Secondary | ICD-10-CM | POA: Diagnosis not present

## 2016-11-25 DIAGNOSIS — I5023 Acute on chronic systolic (congestive) heart failure: Secondary | ICD-10-CM

## 2016-11-25 NOTE — Progress Notes (Signed)
Daily Session Note  Patient Details  Name: Bruce Travis MRN: [id number] Date of Birth: 1935-06-24 Referring Provider:     Pulmonary Rehab Walk Test from 09/23/2016 in [place]  Referring Provider  Dr. Nelda Marseille      Encounter Date: 11/25/2016  Check In:     Session Check In - 11/25/16 1053      Check-In   Location [facility]   Staff Present Su Hilt, MS, ACSM RCEP, Exercise Physiologist;Lisa Ysidro Evert, RN;Portia Rollene Rotunda, RN, BSN   Supervising physician immediately available to respond to emergencies [facility] immediately available   Physician(s) Dr. Allyson Sabal   Medication changes reported     No   Fall or balance concerns reported    No   Tobacco Cessation No Change   Warm-up and Cool-down Performed as group-led instruction   Resistance Training Performed Yes   VAD Patient? No     Pain Assessment   Currently in Pain? No/denies   Multiple Pain Sites No      Capillary Blood Glucose: No results found for this or any previous visit (from the past 24 hour(s)).      Exercise Prescription Changes - 11/25/16 1300      Response to Exercise   Blood Pressure (Admit) 116/62   Blood Pressure (Exercise) 120/70   Blood Pressure (Exit) 112/60   Heart Rate (Admit) 68 bpm   Heart Rate (Exercise) 76 bpm   Heart Rate (Exit) 67 bpm   Oxygen Saturation (Admit) 95 %   Oxygen Saturation (Exercise) 93 %   Oxygen Saturation (Exit) 94 %   Rating of Perceived Exertion (Exercise) 13   Perceived Dyspnea (Exercise) 3   Duration Continue with 45 min of aerobic exercise without signs/symptoms of physical distress.   Intensity THRR unchanged     Progression   Progression Continue to progress workloads to maintain intensity without signs/symptoms of physical distress.     Resistance Training   Training Prescription Yes   Weight orange bands   Reps 10-15   Time 10 Minutes     Interval Training   Interval Training No     Oxygen   Oxygen Continuous   Liters 6     Bike   Level 4   Minutes 17     NuStep   Level 5   Minutes 17   METs 2.5      History  Smoking Status  . Former Smoker  . Packs/day: 2.00  . Years: 35.00  . Types: Cigarettes  . Quit date: 01/21/1989  Smokeless Tobacco  . Former Systems developer  . Types: Chew  . Quit date: 08/23/2001    Goals Met:  Exercise tolerated well No report of cardiac concerns or symptoms Strength training completed today  Goals Unmet:  Not Applicable  Comments: Service time is from 10:30a to 12:40p    Dr. Rush Farmer is Medical Director for Pulmonary Rehab at [facility].

## 2016-11-26 NOTE — Telephone Encounter (Signed)
Medication refilled per protocol. 

## 2016-11-30 ENCOUNTER — Encounter (HOSPITAL_COMMUNITY)
Admission: RE | Admit: 2016-11-30 | Discharge: 2016-11-30 | Disposition: A | Discharge status: Home / Self Care | Payer: Medicare Other | Source: Ambulatory Visit | Attending: Internal Medicine | Admitting: Internal Medicine

## 2016-11-30 VITALS — Wt 127.0 lb

## 2016-11-30 DIAGNOSIS — I5023 Acute on chronic systolic (congestive) heart failure: Secondary | ICD-10-CM

## 2016-11-30 DIAGNOSIS — I5021 Acute systolic (congestive) heart failure: Secondary | ICD-10-CM | POA: Diagnosis not present

## 2016-11-30 NOTE — Progress Notes (Signed)
Daily Session Note  Patient Details  Name: Bruce Travis MRN: [id number] Date of Birth: 1935/06/19 Referring Provider:     Pulmonary Rehab Walk Test from 09/23/2016 in [place]  Referring Provider  Dr. Nelda Marseille      Encounter Date: 11/30/2016  Check In:     Session Check In - 11/30/16 1030      Check-In   Location [facility]   Staff Present Rosebud Poles, RN, BSN;Molly diVincenzo, MS, ACSM RCEP, Exercise Physiologist;Lisa Ysidro Evert, RN;Jumaane [place]ford Rollene Rotunda, RN, BSN   Supervising physician immediately available to respond to emergencies [facility] immediately available   Physician(s) Dr. Ree Kida   Medication changes reported     No   Fall or balance concerns reported    No   Tobacco Cessation No Change   Warm-up and Cool-down Performed as group-led instruction   Resistance Training Performed Yes   VAD Patient? No     Pain Assessment   Currently in Pain? No/denies   Multiple Pain Sites No      Capillary Blood Glucose: No results found for this or any previous visit (from the past 24 hour(s)).      Exercise Prescription Changes - 11/30/16 1226      Response to Exercise   Blood Pressure (Admit) 122/64   Blood Pressure (Exercise) 126/70   Blood Pressure (Exit) 114/60   Heart Rate (Admit) 72 bpm   Heart Rate (Exercise) 84 bpm   Heart Rate (Exit) 74 bpm   Oxygen Saturation (Admit) 96 %   Oxygen Saturation (Exercise) 87 %   Oxygen Saturation (Exit) 95 %   Rating of Perceived Exertion (Exercise) 13   Perceived Dyspnea (Exercise) 3   Duration Continue with 45 min of aerobic exercise without signs/symptoms of physical distress.   Intensity THRR unchanged     Progression   Progression Continue to progress workloads to maintain intensity without signs/symptoms of physical distress.     Resistance Training   Training Prescription Yes   Weight orange bands   Reps 10-15   Time 10 Minutes     Interval Training   Interval Training  No     Oxygen   Oxygen Continuous   Liters 6     Bike   Level 4   Minutes 17     NuStep   Level 5   Minutes 17   METs 2.7     Track   Laps 14   Minutes 17      History  Smoking Status  . Former Smoker  . Packs/day: 2.00  . Years: 35.00  . Types: Cigarettes  . Quit date: 01/21/1989  Smokeless Tobacco  . Former Systems developer  . Types: Chew  . Quit date: 08/23/2001    Goals Met:  Independence with exercise equipment Improved SOB with ADL's Using PLB without cueing & demonstrates good technique Exercise tolerated well No report of cardiac concerns or symptoms Strength training completed today  Goals Unmet:  Not Applicable  Comments: Service time is from 1030 to 1200   Dr. Rush Farmer is Medical Director for Pulmonary Rehab at [facility].

## 2016-11-30 NOTE — Progress Notes (Signed)
Pulmonary Individual Treatment Plan  Patient Details  Name: Bruce Travis MRN: [id number] Date of Birth: 24-Jun-1935 Referring Provider:     Pulmonary Rehab Walk Test from 09/23/2016 in [facility]  Referring Provider  Dr. Nelda Marseille      Initial Encounter Date:    Pulmonary Rehab Walk Test from 09/23/2016 in [facility]  Date  09/23/16  Referring Provider  Dr. Nelda Marseille      Visit Diagnosis: Acute on chronic systolic heart failure ([place])  Acute systolic heart failure ([place])  Patient's Home Medications on Admission:   Current Outpatient Prescriptions:  .  albuterol (PROAIR HFA) 108 (90 Base) MCG/ACT inhaler, INHALE 2 PUFFS EVERY 4 HOURS AS NEEDED FOR SHORTNESS OF BREATH., Disp: 2 Inhaler, Rfl: 5 .  AMBULATORY NON FORMULARY MEDICATION, O2 2 Lpm continuous, Disp: , Rfl:  .  amiodarone (PACERONE) 200 MG tablet, Take 0.5 tablets (100 mg total) by mouth daily., Disp: 30 tablet, Rfl: 6 .  aspirin EC 81 MG tablet, Take 81 mg by mouth daily., Disp: , Rfl:  .  azelastine (ASTELIN) 0.1 % nasal spray, PLACE 2 SPRAYS INTO EACH NOSTRILS TWICE DAILY AS DIRECTED., Disp: 30 mL, Rfl: 0 .  Calcium Carbonate-Vitamin D (CALTRATE 600+D) 600-400 MG-UNIT tablet, Take 1 tablet by mouth daily., Disp: , Rfl:  .  ENTRESTO 49-51 MG, TAKE (1) TABLET BY MOUTH TWICE DAILY., Disp: 60 tablet, Rfl: 6 .  montelukast (SINGULAIR) 10 MG tablet, TAKE 1 TABLET BY MOUTH DAILY., Disp: 90 tablet, Rfl: 0 .  Multiple Vitamin (MULTIVITAMIN WITH MINERALS) TABS tablet, Take 1 tablet by mouth daily., Disp: , Rfl:  .  pantoprazole (PROTONIX) 40 MG tablet, TAKE ONE TABLET BY MOUTH DAILY., Disp: 90 tablet, Rfl: 3 .  polyethylene glycol (MIRALAX / GLYCOLAX) packet, Take 17 g by mouth daily as needed for mild constipation. , Disp: , Rfl:  .  SPIRIVA HANDIHALER 18 MCG inhalation capsule, INHALE 1 CAPSULE ONCE DAILY AS DIRECTED, Disp: 30 capsule, Rfl: 11 .  spironolactone (ALDACTONE) 25 MG  tablet, Take 0.5 tablets (12.5 mg total) by mouth daily., Disp: 16 tablet, Rfl: 6 .  SYMBICORT 160-4.5 MCG/ACT inhaler, INHALE 2 PUFFS FIRST THING IN MORNING AND THEN ANOTHER 2 PUFFS ABOUT 12 HOURS LATER., Disp: 10.2 g, Rfl: 3  Past Medical History: Past Medical History:  Diagnosis Date  . Allergic rhinitis   . Bronchiectasis   . CAD (coronary artery disease)   . Celiac disease   . Chronic heart failure ([place])   . Chronic kidney disease (CKD), stage III (moderate)   . COPD (chronic obstructive pulmonary disease) ([place])   . Diverticulosis of colon (without mention of hemorrhage)   . Esophageal reflux   . Family history of adverse reaction to anesthesia    daughter gets PONV  . Family hx of colon cancer   . History of stomach ulcers 1980s  . Hyperlipemia   . Hypertension   . 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 Myocardial infarction    "previous MI/echo in 12/2015"  . Nephrolithiasis    "got them now; never had OR/scopes" (02/03/2016)  . On home oxygen therapy    "2L usually; 3L last 3-4 days; 24/7" (08/06/2016)  . Other diseases of lung, not elsewhere classified   . Personal history of colonic polyps 04/26/2007   hyperplastic   . Pneumonia "several times"    Tobacco Use: History  Smoking Status  . Former Smoker  .  Packs/day: 2.00  . Years: 35.00  . Types: Cigarettes  . Quit date: 01/21/1989  Smokeless Tobacco  . Former Systems developer  . Types: Chew  . Quit date: 08/23/2001    Labs: Recent Review Flowsheet Data    Labs for ITP Cardiac and Pulmonary Rehab Latest Ref Rng & Units 02/04/2016 02/05/2016 02/06/2016 03/09/2016 08/06/2016   Cholestrol 125 - 200 mg/dL - - - 208(H) -   LDLCALC <130 mg/dL - - - 132(H) -   HDL >=40 mg/dL - - - 33(L) -   Trlycerides <150 mg/dL - - - 217(H) -   Hemoglobin A1c 4.8 - 5.6 % - 5.6 - - -   PHART 7.350 - 7.450 - - - - 7.447   PCO2ART 32.0 - 48.0 mmHg - - - - 33.8   HCO3 20.0 - 28.0 mmol/L - - - - 23.3   TCO2 0 - 100 mmol/L  - - - - 24   ACIDBASEDEF 0.0 - 2.0 mmol/L - - - - -   O2SAT % 67.0 80.1 72.0 - 93.0      Capillary Blood Glucose: No results found for: GLUCAP   ADL UCSD:     Pulmonary Assessment Scores    Row Name 07/01/16 1649         ADL UCSD   ADL Phase Exit     SOB Score total 71        Pulmonary Function Assessment:     Pulmonary Function Assessment - 09/20/16 1025      Breath   Bilateral Breath Sounds Clear   Shortness of Breath Limiting activity;Panic with Shortness of Breath;Fear of Shortness of Breath;Yes      Exercise Target Goals:    Exercise Program Goal: Individual exercise prescription set with THRR, safety & activity barriers. Participant demonstrates ability to understand and report RPE using BORG scale, to self-measure pulse accurately, and to acknowledge the importance of the exercise prescription.  Exercise Prescription Goal: Starting with aerobic activity 30 plus minutes a day, 3 days per week for initial exercise prescription. Provide home exercise prescription and guidelines that participant acknowledges understanding prior to discharge.  Activity Barriers & Risk Stratification:     Activity Barriers & Cardiac Risk Stratification - 09/20/16 1019      Activity Barriers & Cardiac Risk Stratification   Activity Barriers None      6 Minute Walk:     6 Minute Walk    Row Name 07/06/16 1312 09/23/16 1657       6 Minute Walk   Phase Discharge Initial    Distance 1655 feet 1544 feet    Walk Time 6 minutes 6 minutes    # of Rest Breaks 0 0    MPH 3.13 2.92    METS 3.37 3.22    RPE 14 13    Perceived Dyspnea  3 2    Symptoms No  -    Resting HR 72 bpm 73 bpm    Resting BP 108/60 124/70    Max Ex. HR 99 bpm 99 bpm    Max Ex. BP 174/60 154/68    2 Minute Post BP 144/60  -      Interval HR   Baseline HR 72 73    1 Minute HR 96 65    2 Minute HR 96 78    3 Minute HR 81 83    4 Minute HR 81 88    5 Minute HR 90 93    6 Minute  HR 99 99    2  Minute Post HR 85 85    Interval Heart Rate? Yes Yes      Interval Oxygen   Interval Oxygen? Yes Yes    Baseline Oxygen Saturation % 93 % 92 %    Baseline Liters of Oxygen 4 L 4 L    1 Minute Oxygen Saturation % 91 % 92 %    1 Minute Liters of Oxygen 4 L 4 L    2 Minute Oxygen Saturation % 91 % 91 %    2 Minute Liters of Oxygen 4 L 4 L    3 Minute Oxygen Saturation % 86 % 90 %    3 Minute Liters of Oxygen 4 L 4 L    4 Minute Oxygen Saturation % 86 % 88 %    4 Minute Liters of Oxygen 6 L 4 L    5 Minute Oxygen Saturation % 85 % 87 %    5 Minute Liters of Oxygen 6 L 4 L    6 Minute Oxygen Saturation % 85 % 86 %    6 Minute Liters of Oxygen 6 L 6 L    2 Minute Post Oxygen Saturation % 92 % 91 %    2 Minute Post Liters of Oxygen 6 L 6 L       Oxygen Initial Assessment:     Oxygen Initial Assessment - 11/01/16 1036      Home Oxygen   Home Oxygen Device Portable Concentrator;Home Concentrator   Sleep Oxygen Prescription Continuous   Home Exercise Oxygen Prescription Continuous   Home at Rest Exercise Oxygen Prescription Continuous   Compliance with Home Oxygen Use Yes     Intervention   Short Term Goals To learn and exhibit compliance with exercise, home and travel O2 prescription;To learn and understand importance of monitoring SPO2 with pulse oximeter and demonstrate accurate use of the pulse oximeter.;To Learn and understand importance of maintaining oxygen saturations>88%;To learn and demonstrate proper purse lipped breathing techniques or other breathing techniques.;To learn and demonstrate proper use of respiratory medications   Long  Term Goals Exhibits compliance with exercise, home and travel O2 prescription;Verbalizes importance of monitoring SPO2 with pulse oximeter and return demonstration;Maintenance of O2 saturations>88%;Exhibits proper breathing techniques, such as purse lipped breathing or other method taught during program session;Compliance with respiratory  medication;Demonstrates proper use of MDI's      Oxygen Re-Evaluation:     Oxygen Re-Evaluation    Row Name 11/29/16 1618             Program Oxygen Prescription   Program Oxygen Prescription Continuous       Liters per minute 4         Home Oxygen   Home Oxygen Device Portable Concentrator;Home Concentrator       Sleep Oxygen Prescription Continuous       Liters per minute 2       Home Exercise Oxygen Prescription Continuous       Liters per minute 4       Home at Rest Exercise Oxygen Prescription Continuous       Liters per minute 2         Goals/Expected Outcomes   Short Term Goals To learn and exhibit compliance with exercise, home and travel O2 prescription;To learn and understand importance of monitoring SPO2 with pulse oximeter and demonstrate accurate use of the pulse oximeter.;To Learn and understand importance of maintaining oxygen saturations>88%;To learn and demonstrate proper purse  lipped breathing techniques or other breathing techniques.;To learn and demonstrate proper use of respiratory medications       Long  Term Goals Exhibits compliance with exercise, home and travel O2 prescription;Verbalizes importance of monitoring SPO2 with pulse oximeter and return demonstration;Maintenance of O2 saturations>88%;Exhibits proper breathing techniques, such as purse lipped breathing or other method taught during program session;Compliance with respiratory medication;Demonstrates proper use of MDI's       Goals/Expected Outcomes patient will verbalize compliance with home and program oxygen compliance          Oxygen Discharge (Final Oxygen Re-Evaluation):     Oxygen Re-Evaluation - 11/29/16 1618      Program Oxygen Prescription   Program Oxygen Prescription Continuous   Liters per minute 4     Home Oxygen   Home Oxygen Device Portable Concentrator;Home Concentrator   Sleep Oxygen Prescription Continuous   Liters per minute 2   Home Exercise Oxygen Prescription  Continuous   Liters per minute 4   Home at Rest Exercise Oxygen Prescription Continuous   Liters per minute 2     Goals/Expected Outcomes   Short Term Goals To learn and exhibit compliance with exercise, home and travel O2 prescription;To learn and understand importance of monitoring SPO2 with pulse oximeter and demonstrate accurate use of the pulse oximeter.;To Learn and understand importance of maintaining oxygen saturations>88%;To learn and demonstrate proper purse lipped breathing techniques or other breathing techniques.;To learn and demonstrate proper use of respiratory medications   Long  Term Goals Exhibits compliance with exercise, home and travel O2 prescription;Verbalizes importance of monitoring SPO2 with pulse oximeter and return demonstration;Maintenance of O2 saturations>88%;Exhibits proper breathing techniques, such as purse lipped breathing or other method taught during program session;Compliance with respiratory medication;Demonstrates proper use of MDI's   Goals/Expected Outcomes patient will verbalize compliance with home and program oxygen compliance      Initial Exercise Prescription:     Initial Exercise Prescription - 09/23/16 1600      Date of Initial Exercise RX and Referring Provider   Date 09/23/16   Referring Provider Dr. Nelda Marseille     Oxygen   Oxygen Continuous   Liters 6     Bike   Level 0.5   Minutes 17     NuStep   Level 4   Minutes 17   METs 2     Track   Laps 10   Minutes 17     Prescription Details   Frequency (times per week) 2   Duration Progress to 45 minutes of aerobic exercise without signs/symptoms of physical distress     Intensity   THRR 40-80% of Max Heartrate 56-113   Ratings of Perceived Exertion 11-13   Perceived Dyspnea 0-4     Progression   Progression Continue progressive overload as per policy without signs/symptoms or physical distress.     Resistance Training   Training Prescription Yes   Weight orange bands    Reps 10-12      Perform Capillary Blood Glucose checks as needed.  Exercise Prescription Changes:     Exercise Prescription Changes    Row Name 06/03/16 1200 06/08/16 1200 06/10/16 1200 06/15/16 1200 06/22/16 1200     Response to Exercise   Blood Pressure (Admit) 100/60 110/56 110/60 120/70 127/74   Blood Pressure (Exercise) 130/66 120/60 148/60 126/70 140/58   Blood Pressure (Exit) 118/64 106/60 114/62 118/68 96/60   Heart Rate (Admit) 54 bpm 66 bpm 63 bpm 60 bpm 66 bpm  Heart Rate (Exercise) 69 bpm 76 bpm 74 bpm 81 bpm 78 bpm   Heart Rate (Exit) 67 bpm 67 bpm 58 bpm 70 bpm 67 bpm   Oxygen Saturation (Admit) 95 % 95 % 94 % 94 % 92 %   Oxygen Saturation (Exercise) 86 % 88 % 90 % 88 % 83 %  sat increased to 88% with rest   Oxygen Saturation (Exit) 96 % 94 % 97 % 93 % 93 %   Rating of Perceived Exertion (Exercise) 13 13 13 13 13    Perceived Dyspnea (Exercise) 3 1 2 2 3    Duration Progress to 45 minutes of aerobic exercise without signs/symptoms of physical distress Progress to 45 minutes of aerobic exercise without signs/symptoms of physical distress Progress to 45 minutes of aerobic exercise without signs/symptoms of physical distress Progress to 45 minutes of aerobic exercise without signs/symptoms of physical distress Progress to 45 minutes of aerobic exercise without signs/symptoms of physical distress   Intensity THRR unchanged THRR unchanged THRR unchanged THRR unchanged THRR unchanged     Progression   Progression Continue to progress workloads to maintain intensity without signs/symptoms of physical distress. Continue to progress workloads to maintain intensity without signs/symptoms of physical distress. Continue to progress workloads to maintain intensity without signs/symptoms of physical distress. Continue to progress workloads to maintain intensity without signs/symptoms of physical distress. Continue to progress workloads to maintain intensity without signs/symptoms of  physical distress.     Resistance Training   Training Prescription Yes Yes Yes Yes Yes   Weight orange bands orange bands orange bands orange bands orange bands   Reps 10-12  10 minutes of strength training 10-12  10 minutes of strength training 10-12  10 minutes of strength training 10-12  10 minutes of strength training 10-12  10 minutes of strength training     Interval Training   Interval Training No No No No No     Oxygen   Oxygen Continuous  desaturating on track increased to 4 L Continuous Continuous Continuous Continuous   Liters 4 4 4 4 4      Recumbant Bike   Level  - 4 4 4 4    Minutes  - 17 17 17 17      NuStep   Level 4 4  - 5 5   Minutes 17 17  - 17 17   METs 2 2.3  - 2.3 2.4     Track   Laps 15 18 16 17 15    Minutes 17 17 17 17 17      Exercise Review   Progression  -  -  - Yes  -   Row Name 06/29/16 1200 07/01/16 1300 09/30/16 1200 10/05/16 1200 10/07/16 1200     Response to Exercise   Blood Pressure (Admit) 130/64 104/56 110/60 110/50 120/70   Blood Pressure (Exercise) 140/64 136/76 126/70 108/60 140/70   Blood Pressure (Exit) 126/64 110/64 116/76 104/60 122/80   Heart Rate (Admit) 72 bpm 70 bpm 69 bpm 69 bpm 70 bpm   Heart Rate (Exercise) 81 bpm 78 bpm 86 bpm 82 bpm 72 bpm   Heart Rate (Exit) 73 bpm 63 bpm 69 bpm 75 bpm 65 bpm   Oxygen Saturation (Admit) 92 % 93 % 96 % 96 % 93 %   Oxygen Saturation (Exercise) 87 %  sat increased to 88% with rest 88 %  sat increased to 88% with rest 93 % 88 % 86 %   Oxygen Saturation (Exit) 97 % 95 %  96 % 95 % 93 %   Rating of Perceived Exertion (Exercise) 13 13 13 14 13    Perceived Dyspnea (Exercise) 2 2 1 2 2    Duration Progress to 45 minutes of aerobic exercise without signs/symptoms of physical distress Progress to 45 minutes of aerobic exercise without signs/symptoms of physical distress Progress to 45 minutes of aerobic exercise without signs/symptoms of physical distress Progress to 45 minutes of aerobic exercise  without signs/symptoms of physical distress Progress to 45 minutes of aerobic exercise without signs/symptoms of physical distress   Intensity THRR unchanged THRR unchanged THRR unchanged THRR unchanged THRR unchanged     Progression   Progression Continue to progress workloads to maintain intensity without signs/symptoms of physical distress. Continue to progress workloads to maintain intensity without signs/symptoms of physical distress. Continue to progress workloads to maintain intensity without signs/symptoms of physical distress. Continue to progress workloads to maintain intensity without signs/symptoms of physical distress. Continue to progress workloads to maintain intensity without signs/symptoms of physical distress.     Resistance Training   Training Prescription Yes Yes Yes Yes Yes   Weight orange bands orange bands orange bands orange bands orange bands   Reps 10-12  10 minutes of strength training 10-12  10 minutes of strength training 10-12  10 minutes of strength training 10-12  10 minutes of strength training 10-12  10 minutes of strength training     Interval Training   Interval Training No No No No No     Oxygen   Oxygen Continuous Continuous Continuous Continuous Continuous   Liters 4 4 4 3 3      Bike   Level  -  - 3  scifit 3  scifit -  scifit   Minutes  -  - 17 17 -     Recumbant Bike   Level 5  -  -  -  -   Minutes 17  -  -  -  -     NuStep   Level 5 5 4 4 4    Minutes 17 17 17 17 17    METs 2.3 2.5 2.3 2.3 2.1     Track   Laps 16 15  - 12 13   Minutes 17 17  - 17 17     Exercise Review   Progression Yes  -  -  -  -   Row Name 10/12/16 1200 10/14/16 1200 10/19/16 1200 10/21/16 1200 10/28/16 1300     Response to Exercise   Blood Pressure (Admit) 106/60 100/52 104/50 118/64 108/50   Blood Pressure (Exercise) 120/62 130/70 132/60 126/76 128/79   Blood Pressure (Exit) 106/58 122/76 100/64 144/70 110/70   Heart Rate (Admit) 72 bpm 63 bpm 70 bpm 70  bpm 68 bpm   Heart Rate (Exercise) 77 bpm 70 bpm 111 bpm 93 bpm 82 bpm   Heart Rate (Exit) 69 bpm 61 bpm 77 bpm 72 bpm 65 bpm   Oxygen Saturation (Admit) 94 % 93 % 93 % 94 % 95 %   Oxygen Saturation (Exercise) 92 % 91 % 87 % 88 % 93 %   Oxygen Saturation (Exit) 94 % 97 % 94 % 91 % 96 %   Rating of Perceived Exertion (Exercise) 13 13 13 13 13    Perceived Dyspnea (Exercise) 2 2 2 2 2    Duration Progress to 45 minutes of aerobic exercise without signs/symptoms of physical distress Progress to 45 minutes of aerobic exercise without signs/symptoms of physical distress Progress to 45  minutes of aerobic exercise without signs/symptoms of physical distress Continue with 45 min of aerobic exercise without signs/symptoms of physical distress. Continue with 45 min of aerobic exercise without signs/symptoms of physical distress.   Intensity THRR unchanged THRR unchanged THRR unchanged THRR unchanged THRR unchanged     Progression   Progression Continue to progress workloads to maintain intensity without signs/symptoms of physical distress. Continue to progress workloads to maintain intensity without signs/symptoms of physical distress. Continue to progress workloads to maintain intensity without signs/symptoms of physical distress. Continue to progress workloads to maintain intensity without signs/symptoms of physical distress. Continue to progress workloads to maintain intensity without signs/symptoms of physical distress.     Resistance Training   Training Prescription Yes Yes Yes Yes Yes   Weight orange bands orange bands orange bands orange bands orange bands   Reps 10-12  10 minutes of strength training 10-12  10 minutes of strength training 10-15 10-15 10-15   Time  -  - 10 Minutes 10 Minutes 10 Minutes     Interval Training   Interval Training No No No No No     Oxygen   Oxygen Continuous Continuous Continuous Continuous Continuous   Liters 3 3 3 3 3      Bike   Level 1  scifit 3  scifit 3   scifit 4 4   Minutes 17 17 17 17 17      NuStep   Level 4 4 4   - 4   Minutes 17 17 17   - 17   METs 2.2 2.3 2.6  - 2.2     Track   Laps 10 - 10 11  -   Minutes 17 - 17 17  -     Home Exercise Plan   Plans to continue exercise at  -  - Home (comment)  -  -   Frequency  -  - Add 3 additional days to program exercise sessions.  -  -   Initial Home Exercises Provided  -  - -  walking  -  -     Exercise Review   Progression  - Yes Yes Yes  -   Row Name 11/02/16 1200 11/04/16 1200 11/09/16 1200 11/11/16 1200 11/16/16 1200     Response to Exercise   Blood Pressure (Admit) 104/46 104/60 112/72 118/70 110/64   Blood Pressure (Exercise) 140/72 110/70 120/64 122/70 126/54   Blood Pressure (Exit) 118/70 120/70 108/70 102/58 126/74   Heart Rate (Admit) 65 bpm 71 bpm 61 bpm 68 bpm 72 bpm   Heart Rate (Exercise) 88 bpm 81 bpm 87 bpm 73 bpm 85 bpm   Heart Rate (Exit) 67 bpm 66 bpm 76 bpm 67 bpm 76 bpm   Oxygen Saturation (Admit) 96 % 94 % 94 % 94 % 94 %   Oxygen Saturation (Exercise) 86 % 82 %  4 liters on track-increased to 6l-92% 89 % 93 % 89 %   Oxygen Saturation (Exit) 91 % 94 % 97 % 96 % 95 %   Rating of Perceived Exertion (Exercise) 13 13 13 13 13    Perceived Dyspnea (Exercise) 2 2 2 2 2    Duration Continue with 45 min of aerobic exercise without signs/symptoms of physical distress. Continue with 45 min of aerobic exercise without signs/symptoms of physical distress. Continue with 45 min of aerobic exercise without signs/symptoms of physical distress. Continue with 45 min of aerobic exercise without signs/symptoms of physical distress. Continue with 45 min of aerobic exercise without signs/symptoms  of physical distress.   Intensity THRR unchanged THRR unchanged THRR unchanged THRR unchanged THRR unchanged     Progression   Progression Continue to progress workloads to maintain intensity without signs/symptoms of physical distress. Continue to progress workloads to maintain intensity  without signs/symptoms of physical distress. Continue to progress workloads to maintain intensity without signs/symptoms of physical distress. Continue to progress workloads to maintain intensity without signs/symptoms of physical distress. Continue to progress workloads to maintain intensity without signs/symptoms of physical distress.     Resistance Training   Training Prescription Yes Yes Yes Yes Yes   Weight orange bands orange bands orange bands orange bands orange bands   Reps 10-15 10-15 10-15 10-15 10-15   Time 10 Minutes 10 Minutes 10 Minutes 10 Minutes 10 Minutes     Interval Training   Interval Training No No No No No     Oxygen   Oxygen Continuous Continuous Continuous Continuous Continuous   Liters 3 3 3 3 3      Bike   Level 4  - 4 4 4    Minutes 17  - 17 17 17      NuStep   Level 4 4 4 4 4    Minutes 17 17 17 17 17    METs 2.7 2.2 2.2 2.2 2.3     Track   Laps 12 12 14   - 14   Minutes 17 17 17   - 17   Row Name 11/18/16 1300 11/23/16 1212 11/25/16 1300         Response to Exercise   Blood Pressure (Admit) 100/60 104/62 116/62     Blood Pressure (Exercise) 124/68 120/54 120/70     Blood Pressure (Exit) 110/70 112/82 112/60     Heart Rate (Admit) 68 bpm 71 bpm 68 bpm     Heart Rate (Exercise) 100 bpm 81 bpm 76 bpm     Heart Rate (Exit) 73 bpm 68 bpm 67 bpm     Oxygen Saturation (Admit) 92 % 93 % 95 %     Oxygen Saturation (Exercise) 92 % 86 %  increased to 91 with rest 93 %     Oxygen Saturation (Exit) 92 % 96 % 94 %     Rating of Perceived Exertion (Exercise) 13 13 13      Perceived Dyspnea (Exercise) 2 2 3      Duration Continue with 45 min of aerobic exercise without signs/symptoms of physical distress. Continue with 45 min of aerobic exercise without signs/symptoms of physical distress. Continue with 45 min of aerobic exercise without signs/symptoms of physical distress.     Intensity THRR unchanged THRR unchanged THRR unchanged       Progression   Progression  Continue to progress workloads to maintain intensity without signs/symptoms of physical distress. Continue to progress workloads to maintain intensity without signs/symptoms of physical distress. Continue to progress workloads to maintain intensity without signs/symptoms of physical distress.       Resistance Training   Training Prescription Yes Yes Yes     Weight orange bands orange bands orange bands     Reps 10-15 10-15 10-15     Time 10 Minutes 10 Minutes 10 Minutes       Interval Training   Interval Training No No No       Oxygen   Oxygen Continuous Continuous Continuous     Liters 3 6 6        Bike   Level 4 4 4      Minutes 63 33  17       NuStep   Level  - 4 5     Minutes  - 17 17     METs  - 2.3 2.5       Track   Laps 14 14  -     Minutes 17 17  -        Exercise Comments:     Exercise Comments    Row Name 06/22/16 1226 06/29/16 0720 07/06/16 1320 10/04/16 1515 10/19/16 1235   Exercise Comments Patient will be graduating on 07/01/16 from rehab. He is going to look into different options for his post grad exercise between now and the 9th. He was offered our maintenance program. Will follow up. Patient is doing well in program. Will be graduating 07/06/16. Will discuss post grad exercise plans.  Today was Nygel's last day in program. Patient is going to cont. to exercise at the [facility] near his home. Patient has only exercised one class. Will cont. to monitor and progress.  Home exercise completed      Exercise Goals and Review:   Exercise Goals Re-Evaluation :     Exercise Goals Re-Evaluation    Row Name 11/01/16 1615 11/29/16 1636           Exercise Goal Re-Evaluation   Exercise Goals Review Increase Physical Activity;Increase Strenth and Stamina Increase Physical Activity;Increase Strenth and Stamina      Comments Patient is progressing well in program. He is walking at home on his off days from rehab. Will cont. to monitor and progress. Patient is progressing  well in program. He is walking at home on his off days from rehab. Will cont. to monitor and progress.      Expected Outcomes Through the exercise here at rehab the patient with increase physical capacity, strength, and stamina. Through the exercise here at rehab the patient with increase physical capacity, strength, and stamina.         Discharge Exercise Prescription (Final Exercise Prescription Changes):     Exercise Prescription Changes - 11/25/16 1300      Response to Exercise   Blood Pressure (Admit) 116/62   Blood Pressure (Exercise) 120/70   Blood Pressure (Exit) 112/60   Heart Rate (Admit) 68 bpm   Heart Rate (Exercise) 76 bpm   Heart Rate (Exit) 67 bpm   Oxygen Saturation (Admit) 95 %   Oxygen Saturation (Exercise) 93 %   Oxygen Saturation (Exit) 94 %   Rating of Perceived Exertion (Exercise) 13   Perceived Dyspnea (Exercise) 3   Duration Continue with 45 min of aerobic exercise without signs/symptoms of physical distress.   Intensity THRR unchanged     Progression   Progression Continue to progress workloads to maintain intensity without signs/symptoms of physical distress.     Resistance Training   Training Prescription Yes   Weight orange bands   Reps 10-15   Time 10 Minutes     Interval Training   Interval Training No     Oxygen   Oxygen Continuous   Liters 6     Bike   Level 4   Minutes 17     NuStep   Level 5   Minutes 17   METs 2.5      Nutrition:  Target Goals: Understanding of nutrition guidelines, daily intake of sodium <1567m, cholesterol <2053m calories 30% from fat and 7% or less from saturated fats, daily to have 5 or more servings of fruits and vegetables.  Biometrics:  Pre Biometrics - 09/20/16 1029      Pre Biometrics   Grip Strength 33 kg       Nutrition Therapy Plan and Nutrition Goals:     Nutrition Therapy & Goals - 10/21/16 1230      Nutrition Therapy   Diet High Calorie, High Protein     Personal Nutrition  Goals   Nutrition Goal Prevent further wt loss/ promote wt gain     Intervention Plan   Intervention Prescribe, educate and counsel regarding individualized specific dietary modifications aiming towards targeted core components such as weight, hypertension, lipid management, diabetes, heart failure and other comorbidities.   Expected Outcomes Short Term Goal: Understand basic principles of dietary content, such as calories, fat, sodium, cholesterol and nutrients.;Long Term Goal: Adherence to prescribed nutrition plan.      Nutrition Discharge: Rate Your Plate Scores:     Nutrition Assessments - 10/21/16 1230      Rate Your Plate Scores   Pre Score 41  Rate Your Plate goal < 49 due to pt need to gain wt/prevent wt loss      Nutrition Goals Re-Evaluation:     Nutrition Goals Re-Evaluation    Row Name 07/21/16 1042             Goals   Current Weight 133 lb (60.3 kg)       Nutrition Goal 1-2 lb wt gain per week to a wt gain goal of 6-24 lb       Comment Pt wt is up 4 lb since admission.          Personal Goal #1 Re-Evaluation   Goal Progress Seen Yes          Nutrition Goals Discharge (Final Nutrition Goals Re-Evaluation):     Nutrition Goals Re-Evaluation - 07/21/16 1042      Goals   Current Weight 133 lb (60.3 kg)   Nutrition Goal 1-2 lb wt gain per week to a wt gain goal of 6-24 lb   Comment Pt wt is up 4 lb since admission.      Personal Goal #1 Re-Evaluation   Goal Progress Seen Yes      Psychosocial: Target Goals: Acknowledge presence or absence of significant depression and/or stress, maximize coping skills, provide positive support system. Participant is able to verbalize types and ability to use techniques and skills needed for reducing stress and depression.  Initial Review & Psychosocial Screening:     Initial Psych Review & Screening - 09/20/16 1032      Initial Review   Current issues with --  none identified     Family Dynamics   Good  Support System? Yes     Barriers   Psychosocial barriers to participate in program There are no identifiable barriers or psychosocial needs.     Screening Interventions   Interventions Encouraged to exercise      Quality of Life Scores:     Quality of Life - 07/01/16 1650      Quality of Life Scores   Health/Function Post 18.63 %   Socioeconomic Post 21.79 %   Psych/Spiritual Post 22.5 %   Family Post 22.25 %   GLOBAL Post 20.5 %      PHQ-9: Recent Review Flowsheet Data    Depression screen [facility] 2/9 11/18/2016 09/20/2016 07/06/2016 03/22/2016 04/16/2015   Decreased Interest 0 0 0 0 0   Down, Depressed, Hopeless 0 0 0 0 0   PHQ - 2 Score 0 0  0 0 0   Altered sleeping 0 - - - -   Tired, decreased energy 0 - - - -   Change in appetite 0 - - - -   Feeling bad or failure about yourself  0 - - - -   Trouble concentrating 0 - - - -   Moving slowly or fidgety/restless 0 - - - -   Suicidal thoughts 0 - - - -   PHQ-9 Score 0 - - - -   Difficult doing work/chores Not difficult at all - - - -     Interpretation of Total Score  Total Score Depression Severity:  1-4 = Minimal depression, 5-9 = Mild depression, 10-14 = Moderate depression, 15-19 = Moderately severe depression, 20-27 = Severe depression   Psychosocial Evaluation and Intervention:     Psychosocial Evaluation - 09/20/16 1033      Psychosocial Evaluation & Interventions   Continue Psychosocial Services  No      Psychosocial Re-Evaluation:     Psychosocial Re-Evaluation    Row Name 06/28/16 1622 10/04/16 1547 11/01/16 1037 11/01/16 1216 11/29/16 1621     Psychosocial Re-Evaluation   Current issues with  -  -  -  - None Identified   Comments no psycosocial barriers identified in the past 30 days no psychosocial barriers identified since admission no psychosocial barriers identified since admission  - no psychosocial barriers identified since admission   Expected Outcomes  -  -  - patient will remain free of  psychosocial barriers patient will remain free of psychosocial barriers   Interventions Encouraged to attend Pulmonary Rehabilitation for the exercise Encouraged to attend Pulmonary Rehabilitation for the exercise Encouraged to attend Pulmonary Rehabilitation for the exercise  - Encouraged to attend Pulmonary Rehabilitation for the exercise   Continue Psychosocial Services   -  - No Follow up required  - No Follow up required      Psychosocial Discharge (Final Psychosocial Re-Evaluation):     Psychosocial Re-Evaluation - 11/29/16 1621      Psychosocial Re-Evaluation   Current issues with None Identified   Comments no psychosocial barriers identified since admission   Expected Outcomes patient will remain free of psychosocial barriers   Interventions Encouraged to attend Pulmonary Rehabilitation for the exercise   Continue Psychosocial Services  No Follow up required      Education: Education Goals: Education classes will be provided on a weekly basis, covering required topics. Participant will state understanding/return demonstration of topics presented.  Learning Barriers/Preferences:     Learning Barriers/Preferences - 09/20/16 1023      Learning Barriers/Preferences   Learning Barriers None   Learning Preferences Written Material;Video;Verbal Instruction;Individual Instruction;Group Instruction;Pictoral      Education Topics: Risk Factor Reduction:  -Group instruction that is supported by a PowerPoint presentation. Instructor discusses the definition of a risk factor, different risk factors for pulmonary disease, and how the heart and lungs work together.     PULMONARY REHAB OTHER RESPIRATORY from 11/25/2016 in [place]  Date  11/11/16  Educator  EP  Instruction Review Code  2- meets goals/outcomes      Nutrition for Pulmonary Patient:  -Group instruction provided by PowerPoint slides, verbal discussion, and written materials to support  subject matter. The instructor gives an explanation and review of healthy diet recommendations, which includes a discussion on weight management, recommendations for fruit and vegetable consumption, as well as protein, fluid, caffeine, fiber, sodium, sugar, and alcohol. Tips for eating  when patients are short of breath are discussed.   PULMONARY REHAB OTHER RESPIRATORY from 11/25/2016 in [place]  Date  11/04/16  Educator  RD  Instruction Review Code  R- Review/reinforce      Pursed Lip Breathing:  -Group instruction that is supported by demonstration and informational handouts. Instructor discusses the benefits of pursed lip and diaphragmatic breathing and detailed demonstration on how to preform both.     PULMONARY REHAB OTHER RESPIRATORY from 11/25/2016 in [place]  Date  04/29/16  Educator  RT  Instruction Review Code  2- meets goals/outcomes      Oxygen Safety:  -Group instruction provided by PowerPoint, verbal discussion, and written material to support subject matter. There is an overview of "What is Oxygen" and "Why do we need it".  Instructor also reviews how to create a safe environment for oxygen use, the importance of using oxygen as prescribed, and the risks of noncompliance. There is a brief discussion on traveling with oxygen and resources the patient may utilize.   PULMONARY REHAB OTHER RESPIRATORY from 11/25/2016 in [place]  Date  11/25/16  Educator  RN  Instruction Review Code  2- meets goals/outcomes      Oxygen Equipment:  -Group instruction provided by [facility] Staff utilizing handouts, written materials, and equipment demonstrations.   Signs and Symptoms:  -Group instruction provided by written material and verbal discussion to support subject matter. Warning signs and symptoms of infection, stroke, and heart attack are reviewed and when to call the physician/911  reinforced. Tips for preventing the spread of infection discussed.   Advanced Directives:  -Group instruction provided by verbal instruction and written material to support subject matter. Instructor reviews Advanced Directive laws and proper instruction for filling out document.   Pulmonary Video:  -Group video education that reviews the importance of medication and oxygen compliance, exercise, good nutrition, pulmonary hygiene, and pursed lip and diaphragmatic breathing for the pulmonary patient.   PULMONARY REHAB OTHER RESPIRATORY from 11/25/2016 in [place]  Date  10/14/16  Educator  STAFF  Instruction Review Code  2- meets goals/outcomes      Exercise for the Pulmonary Patient:  -Group instruction that is supported by a PowerPoint presentation. Instructor discusses benefits of exercise, core components of exercise, frequency, duration, and intensity of an exercise routine, importance of utilizing pulse oximetry during exercise, safety while exercising, and options of places to exercise outside of rehab.     PULMONARY REHAB OTHER RESPIRATORY from 11/25/2016 in [place]  Date  10/07/16  Educator  EP  Instruction Review Code  2- meets goals/outcomes      Pulmonary Medications:  -Verbally interactive group education provided by instructor with focus on inhaled medications and proper administration.   PULMONARY REHAB OTHER RESPIRATORY from 11/25/2016 in [place]  Date  11/18/16  Educator  pharm  Instruction Review Code  2- meets goals/outcomes      Anatomy and Physiology of the Respiratory System and Intimacy:  -Group instruction provided by PowerPoint, verbal discussion, and written material to support subject matter. Instructor reviews respiratory cycle and anatomical components of the respiratory system and their functions. Instructor also reviews differences in obstructive and  restrictive respiratory diseases with examples of each. Intimacy, Sex, and Sexuality differences are reviewed with a discussion on how relationships can change when diagnosed with pulmonary disease. Common sexual  concerns are reviewed.   PULMONARY REHAB OTHER RESPIRATORY from 11/25/2016 in [place]  Date  10/28/16  Educator  RN  Instruction Review Code  2- meets goals/outcomes      Knowledge Questionnaire Score:     Knowledge Questionnaire Score - 07/01/16 1649      Knowledge Questionnaire Score   Pre Score 9/13      Core Components/Risk Factors/Patient Goals at Admission:     Personal Goals and Risk Factors at Admission - 09/20/16 1029      Core Components/Risk Factors/Patient Goals on Admission   Increase Strength and Stamina Yes   Improve shortness of breath with ADL's Yes      Core Components/Risk Factors/Patient Goals Review:      Goals and Risk Factor Review    Row Name 06/28/16 1622 09/20/16 1032 10/04/16 1546 11/01/16 1036 11/29/16 1621     Core Components/Risk Factors/Patient Goals Review   Personal Goals Review Increase Strength and Stamina;Improve shortness of breath with ADL's Increase Strength and Stamina;Improve shortness of breath with ADL's Increase Strength and Stamina;Improve shortness of breath with ADL's  - Develop more efficient breathing techniques such as purse lipped breathing and diaphragmatic breathing and practicing self-pacing with activity.;Improve shortness of breath with ADL's   Review see comments section on ITP  - see comments section on ITP see comments section on ITP see comments section on ITP   Expected Outcomes see admission expected outcomes  - see admission expected outcomes see admission expected outcomes see admission expected outcomes      Core Components/Risk Factors/Patient Goals at Discharge (Final Review):      Goals and Risk Factor Review - 11/29/16 1621      Core Components/Risk  Factors/Patient Goals Review   Personal Goals Review Develop more efficient breathing techniques such as purse lipped breathing and diaphragmatic breathing and practicing self-pacing with activity.;Improve shortness of breath with ADL's   Review see comments section on ITP   Expected Outcomes see admission expected outcomes      ITP Comments:   Comments: ITP REVIEW Pt is making expected progress toward pulmonary rehab goals after completing 16 sessions. He continuously tolerates workload increases and has to be reminded to pace himself to maintain o2 saturations. He remains able to be active at home and walks when he is not exercising in pulmonary rehab. Recommend continued exercise, life style modification, education, and utilization of breathing techniques to increase stamina and strength and decrease shortness of breath with exertion.

## 2016-12-02 ENCOUNTER — Encounter (HOSPITAL_COMMUNITY)
Admission: RE | Admit: 2016-12-02 | Discharge: 2016-12-02 | Disposition: A | Discharge status: Home / Self Care | Payer: Medicare Other | Source: Ambulatory Visit | Attending: Internal Medicine | Admitting: Internal Medicine

## 2016-12-02 VITALS — Wt 128.1 lb

## 2016-12-02 DIAGNOSIS — I5023 Acute on chronic systolic (congestive) heart failure: Secondary | ICD-10-CM

## 2016-12-02 DIAGNOSIS — I5021 Acute systolic (congestive) heart failure: Secondary | ICD-10-CM

## 2016-12-02 NOTE — Progress Notes (Signed)
Daily Session Note  Patient Details  Name: Bruce Travis MRN: [id number] Date of Birth: 08/02/1935 Referring Provider:     Pulmonary Rehab Walk Test from 09/23/2016 in [place]  Referring Provider  Dr. Nelda Marseille      Encounter Date: 12/02/2016  Check In:     Session Check In - 12/02/16 1030      Check-In   Location [facility]   Staff Present Rosebud Poles, RN, BSN;Molly diVincenzo, MS, ACSM RCEP, Exercise Physiologist;Lisa Ysidro Evert, RN;Louann Hopson Rollene Rotunda, RN, BSN   Supervising physician immediately available to respond to emergencies [facility] immediately available   Physician(s) Dr. Cathlean Sauer   Medication changes reported     No   Fall or balance concerns reported    No   Tobacco Cessation No Change   Warm-up and Cool-down Performed as group-led instruction   Resistance Training Performed Yes   VAD Patient? No     Pain Assessment   Currently in Pain? No/denies   Multiple Pain Sites No      Capillary Blood Glucose: No results found for this or any previous visit (from the past 24 hour(s)).      Exercise Prescription Changes - 12/02/16 1229      Response to Exercise   Blood Pressure (Admit) 110/60   Blood Pressure (Exercise) 126/70   Blood Pressure (Exit) 120/64   Heart Rate (Admit) 68 bpm   Heart Rate (Exercise) 78 bpm   Heart Rate (Exit) 73 bpm   Oxygen Saturation (Admit) 93 %   Oxygen Saturation (Exercise) 88 %   Oxygen Saturation (Exit) 94 %   Rating of Perceived Exertion (Exercise) 13   Perceived Dyspnea (Exercise) 2.5   Duration Continue with 45 min of aerobic exercise without signs/symptoms of physical distress.   Intensity THRR unchanged     Progression   Progression Continue to progress workloads to maintain intensity without signs/symptoms of physical distress.     Resistance Training   Training Prescription Yes   Weight orange bands   Reps 10-15   Time 10 Minutes     Interval Training   Interval  Training No     Oxygen   Oxygen Continuous   Liters 6     Bike   Level 4   Minutes 17     Track   Laps 15   Minutes 17      History  Smoking Status  . Former Smoker  . Packs/day: 2.00  . Years: 35.00  . Types: Cigarettes  . Quit date: 01/21/1989  Smokeless Tobacco  . Former Systems developer  . Types: Chew  . Quit date: 08/23/2001    Goals Met:  Independence with exercise equipment Improved SOB with ADL's Using PLB without cueing & demonstrates good technique Exercise tolerated well No report of cardiac concerns or symptoms Strength training completed today  Goals Unmet:  Not Applicable  Comments: Service time is from 1030 to 1205   Dr. Rush Farmer is Medical Director for Pulmonary Rehab at [facility].

## 2016-12-07 ENCOUNTER — Encounter (HOSPITAL_COMMUNITY)
Admission: RE | Admit: 2016-12-07 | Discharge: 2016-12-07 | Disposition: A | Discharge status: Home / Self Care | Payer: Medicare Other | Source: Ambulatory Visit | Attending: Internal Medicine | Admitting: Internal Medicine

## 2016-12-07 VITALS — Wt 127.9 lb

## 2016-12-07 DIAGNOSIS — I5021 Acute systolic (congestive) heart failure: Secondary | ICD-10-CM | POA: Diagnosis not present

## 2016-12-07 DIAGNOSIS — I5023 Acute on chronic systolic (congestive) heart failure: Secondary | ICD-10-CM

## 2016-12-07 NOTE — Progress Notes (Signed)
Daily Session Note  Patient Details  Name: Bruce Travis MRN: [id number] Date of Birth: 04/06/1935 Referring Provider:     Pulmonary Rehab Walk Test from 09/23/2016 in [place]  Referring Provider  Dr. Nelda Marseille      Encounter Date: 12/07/2016  Check In:     Session Check In - 12/07/16 1215      Check-In   Location [facility]   Staff Present Su Hilt, MS, ACSM RCEP, Exercise Physiologist;Lisa Ysidro Evert, RN;Joan Leonia Reeves, RN, Luisa Hart, RN, BSN   Supervising physician immediately available to respond to emergencies [facility] immediately available   Physician(s) Dr. Cathlean Sauer   Medication changes reported     No   Fall or balance concerns reported    No   Tobacco Cessation No Change   Warm-up and Cool-down Performed as group-led instruction   Resistance Training Performed Yes   VAD Patient? No     Pain Assessment   Currently in Pain? No/denies   Multiple Pain Sites No      Capillary Blood Glucose: No results found for this or any previous visit (from the past 24 hour(s)).      Exercise Prescription Changes - 12/07/16 1200      Response to Exercise   Blood Pressure (Admit) 114/54   Blood Pressure (Exercise) 120/60   Blood Pressure (Exit) 118/70   Heart Rate (Admit) 62 bpm   Heart Rate (Exercise) 80 bpm   Heart Rate (Exit) 75 bpm   Oxygen Saturation (Admit) 94 %   Oxygen Saturation (Exercise) 89 %   Oxygen Saturation (Exit) 97 %   Rating of Perceived Exertion (Exercise) 13   Perceived Dyspnea (Exercise) 2   Duration Continue with 45 min of aerobic exercise without signs/symptoms of physical distress.   Intensity THRR unchanged     Progression   Progression Continue to progress workloads to maintain intensity without signs/symptoms of physical distress.     Resistance Training   Training Prescription Yes   Weight orange bands   Reps 10-15   Time 10 Minutes     Interval Training   Interval Training  No     Oxygen   Oxygen Continuous   Liters 6     Bike   Level 4   Minutes 17     NuStep   Level 5   Minutes 17   METs 2.5     Track   Laps 14   Minutes 17      History  Smoking Status  . Former Smoker  . Packs/day: 2.00  . Years: 35.00  . Types: Cigarettes  . Quit date: 01/21/1989  Smokeless Tobacco  . Former Systems developer  . Types: Chew  . Quit date: 08/23/2001    Goals Met:  Exercise tolerated well No report of cardiac concerns or symptoms Strength training completed today  Goals Unmet:  Not Applicable  Comments: Service time is from 10:30a to 12:10p    Dr. Rush Farmer is Medical Director for Pulmonary Rehab at [facility].

## 2016-12-09 ENCOUNTER — Encounter (HOSPITAL_COMMUNITY)
Admission: RE | Admit: 2016-12-09 | Discharge: 2016-12-09 | Disposition: A | Discharge status: Home / Self Care | Payer: Medicare Other | Source: Ambulatory Visit | Attending: Internal Medicine | Admitting: Internal Medicine

## 2016-12-09 VITALS — Wt 128.5 lb

## 2016-12-09 DIAGNOSIS — I5021 Acute systolic (congestive) heart failure: Secondary | ICD-10-CM

## 2016-12-09 DIAGNOSIS — I5023 Acute on chronic systolic (congestive) heart failure: Secondary | ICD-10-CM

## 2016-12-09 NOTE — Progress Notes (Signed)
Daily Session Note  Patient Details  Name: Bruce Travis MRN: [id number] Date of Birth: [birth year] Referring Provider:     Pulmonary Rehab Walk Test from 09/23/2016 in [place]  Referring Provider  Dr. Nelda Marseille      Encounter Date: 12/09/2016  Check In:     Session Check In - 12/09/16 1030      Check-In   Location [facility]   Staff Present Su Hilt, MS, ACSM RCEP, Exercise Physiologist;Joan Leonia Reeves, RN, Luisa Hart, RN, BSN   Supervising physician immediately available to respond to emergencies [facility] immediately available   Physician(s) Dr. Candiss Norse   Medication changes reported     No   Fall or balance concerns reported    No   Tobacco Cessation No Change   Warm-up and Cool-down Performed as group-led instruction   Resistance Training Performed Yes   VAD Patient? No     Pain Assessment   Currently in Pain? No/denies   Multiple Pain Sites No      Capillary Blood Glucose: No results found for this or any previous visit (from the past 24 hour(s)).      Exercise Prescription Changes - 12/09/16 1200      Response to Exercise   Blood Pressure (Admit) 104/60   Blood Pressure (Exercise) 130/64   Blood Pressure (Exit) 110/64   Heart Rate (Admit) 66 bpm   Heart Rate (Exercise) 89 bpm   Heart Rate (Exit) 67 bpm   Oxygen Saturation (Admit) 94 %   Oxygen Saturation (Exercise) 89 %   Oxygen Saturation (Exit) 95 %   Rating of Perceived Exertion (Exercise) 13   Perceived Dyspnea (Exercise) 2   Duration Continue with 45 min of aerobic exercise without signs/symptoms of physical distress.   Intensity THRR unchanged     Progression   Progression Continue to progress workloads to maintain intensity without signs/symptoms of physical distress.     Resistance Training   Training Prescription Yes   Weight orange bands   Reps 10-15   Time 10 Minutes     Interval Training   Interval Training No     Oxygen    Oxygen Continuous   Liters 6     NuStep   Level 5   Minutes 17   METs 2.3     Track   Laps 18   Minutes 17      History  Smoking Status  . Former Smoker  . Packs/day: 2.00  . Years: 35.00  . Types: Cigarettes  . Quit date: 01/21/1989  Smokeless Tobacco  . Former Systems developer  . Types: Chew  . Quit date: 08/23/2001    Goals Met:  Exercise tolerated well No report of cardiac concerns or symptoms Strength training completed today  Goals Unmet:  Not Applicable  Comments: Service time is from 10:30a to 12:30p    Dr. Rush Farmer is Medical Director for Pulmonary Rehab at [facility].

## 2016-12-14 ENCOUNTER — Encounter (HOSPITAL_COMMUNITY)
Admission: RE | Admit: 2016-12-14 | Discharge: 2016-12-14 | Disposition: A | Discharge status: Home / Self Care | Payer: Medicare Other | Source: Ambulatory Visit | Attending: Internal Medicine | Admitting: Internal Medicine

## 2016-12-14 VITALS — Wt 128.3 lb

## 2016-12-14 DIAGNOSIS — I5021 Acute systolic (congestive) heart failure: Secondary | ICD-10-CM

## 2016-12-14 DIAGNOSIS — I5023 Acute on chronic systolic (congestive) heart failure: Secondary | ICD-10-CM

## 2016-12-14 NOTE — Progress Notes (Signed)
Daily Session Note  Patient Details  Name: Bruce Travis MRN: [id number] Date of Birth: [birth year] Referring Provider:     Pulmonary Rehab Walk Test from 09/23/2016 in [place]  Referring Provider  Dr. Nelda Marseille      Encounter Date: 12/14/2016  Check In:     Session Check In - 12/14/16 1022      Check-In   Location [facility]   Staff Present Su Hilt, MS, ACSM RCEP, Exercise Physiologist;Joan Leonia Reeves, RN, Luisa Hart, RN, Roque Cash, RN   Supervising physician immediately available to respond to emergencies [facility] immediately available   Physician(s) Dr. Candiss Norse   Medication changes reported     No   Fall or balance concerns reported    No   Tobacco Cessation No Change   Warm-up and Cool-down Performed as group-led instruction   Resistance Training Performed Yes   VAD Patient? No     Pain Assessment   Currently in Pain? No/denies   Multiple Pain Sites No      Capillary Blood Glucose: No results found for this or any previous visit (from the past 24 hour(s)).      Exercise Prescription Changes - 12/14/16 1200      Response to Exercise   Blood Pressure (Admit) 110/62   Blood Pressure (Exercise) 110/60   Blood Pressure (Exit) 110/62   Heart Rate (Admit) 67 bpm   Heart Rate (Exercise) 87 bpm   Heart Rate (Exit) 76 bpm   Oxygen Saturation (Admit) 94 %   Oxygen Saturation (Exercise) 89 %   Oxygen Saturation (Exit) 95 %   Rating of Perceived Exertion (Exercise) 13   Perceived Dyspnea (Exercise) 2   Duration Continue with 45 min of aerobic exercise without signs/symptoms of physical distress.   Intensity THRR unchanged     Progression   Progression Continue to progress workloads to maintain intensity without signs/symptoms of physical distress.     Resistance Training   Training Prescription Yes   Weight orange bands   Reps 10-15   Time 10 Minutes     Interval Training   Interval Training  No     Oxygen   Oxygen Continuous   Liters 6     Bike   Level 4   Minutes 17     NuStep   Level 5   Minutes 17   METs 2.5     Track   Laps 14   Minutes 17      History  Smoking Status  . Former Smoker  . Packs/day: 2.00  . Years: 35.00  . Types: Cigarettes  . Quit date: 01/21/1989  Smokeless Tobacco  . Former Systems developer  . Types: Chew  . Quit date: 08/23/2001    Goals Met:  Exercise tolerated well No report of cardiac concerns or symptoms Strength training completed today  Goals Unmet:  Not Applicable  Comments: Service time is from 10:30a to 12:00p    Dr. Rush Farmer is Medical Director for Pulmonary Rehab at [facility].

## 2016-12-16 ENCOUNTER — Encounter (HOSPITAL_COMMUNITY): Payer: Medicare Other

## 2016-12-21 ENCOUNTER — Telehealth (HOSPITAL_COMMUNITY): Payer: Self-pay | Admitting: Family Medicine

## 2016-12-21 ENCOUNTER — Encounter (HOSPITAL_COMMUNITY): Payer: Medicare Other

## 2016-12-23 ENCOUNTER — Encounter (HOSPITAL_COMMUNITY)
Admission: RE | Admit: 2016-12-23 | Discharge: 2016-12-23 | Disposition: A | Discharge status: Home / Self Care | Payer: Medicare Other | Source: Ambulatory Visit | Attending: Internal Medicine | Admitting: Internal Medicine

## 2016-12-23 VITALS — Wt 128.1 lb

## 2016-12-23 DIAGNOSIS — I5023 Acute on chronic systolic (congestive) heart failure: Secondary | ICD-10-CM

## 2016-12-23 DIAGNOSIS — I5021 Acute systolic (congestive) heart failure: Secondary | ICD-10-CM | POA: Diagnosis not present

## 2016-12-23 NOTE — Progress Notes (Signed)
Daily Session Note  Patient Details  Name: Bruce Travis MRN: [id number] Date of Birth: 09/28/1934 Referring Provider:     Pulmonary Rehab Walk Test from 09/23/2016 in [place]  Referring Provider  Dr. Nelda Marseille      Encounter Date: 12/23/2016  Check In:     Session Check In - 12/23/16 1107      Check-In   Location [facility]   Staff Present Rodney Langton, RN;Joan Leonia Reeves, RN, Luisa Hart, RN, BSN   Supervising physician immediately available to respond to emergencies [facility] immediately available   Physician(s) Dr. Sloan Leiter   Medication changes reported     No   Fall or balance concerns reported    No   Tobacco Cessation No Change   Warm-up and Cool-down Performed as group-led instruction   Resistance Training Performed Yes   VAD Patient? No     Pain Assessment   Currently in Pain? No/denies   Multiple Pain Sites No      Capillary Blood Glucose: No results found for this or any previous visit (from the past 24 hour(s)).      Exercise Prescription Changes - 12/23/16 1242      Response to Exercise   Blood Pressure (Admit) 100/60   Blood Pressure (Exercise) 120/66   Blood Pressure (Exit) 124/60   Heart Rate (Admit) 92 bpm   Heart Rate (Exercise) 85 bpm   Heart Rate (Exit) 66 bpm   Oxygen Saturation (Admit) 93 %   Oxygen Saturation (Exercise) 91 %   Oxygen Saturation (Exit) 95 %   Rating of Perceived Exertion (Exercise) 13   Perceived Dyspnea (Exercise) 3   Duration Continue with 45 min of aerobic exercise without signs/symptoms of physical distress.   Intensity THRR unchanged     Progression   Progression Continue to progress workloads to maintain intensity without signs/symptoms of physical distress.     Resistance Training   Training Prescription Yes   Weight orange bands   Reps 10-15   Time 10 Minutes     Interval Training   Interval Training No     Oxygen   Oxygen Continuous   Liters 6     NuStep   Level 5   Minutes 17   METs 2.4     Track   Laps 14   Minutes 17      History  Smoking Status  . Former Smoker  . Packs/day: 2.00  . Years: 35.00  . Types: Cigarettes  . Quit date: 01/21/1989  Smokeless Tobacco  . Former Systems developer  . Types: Chew  . Quit date: 08/23/2001    Goals Met:  Independence with exercise equipment Improved SOB with ADL's Using PLB without cueing & demonstrates good technique Exercise tolerated well No report of cardiac concerns or symptoms Strength training completed today  Goals Unmet:  Not Applicable  Comments: Service time is from 1030 to 1220   Dr. Rush Farmer is Medical Director for Pulmonary Rehab at [facility].

## 2016-12-28 ENCOUNTER — Telehealth: Payer: Self-pay

## 2016-12-28 ENCOUNTER — Encounter (HOSPITAL_COMMUNITY)
Admission: RE | Admit: 2016-12-28 | Discharge: 2016-12-28 | Disposition: A | Discharge status: Home / Self Care | Payer: Medicare Other | Source: Ambulatory Visit | Attending: Internal Medicine | Admitting: Internal Medicine

## 2016-12-28 VITALS — Wt 127.6 lb

## 2016-12-28 DIAGNOSIS — J449 Chronic obstructive pulmonary disease, unspecified: Secondary | ICD-10-CM

## 2016-12-28 DIAGNOSIS — I5021 Acute systolic (congestive) heart failure: Secondary | ICD-10-CM | POA: Diagnosis not present

## 2016-12-28 DIAGNOSIS — I5023 Acute on chronic systolic (congestive) heart failure: Secondary | ICD-10-CM

## 2016-12-28 NOTE — Progress Notes (Signed)
Daily Session Note  Patient Details  Name: Bruce Travis MRN: [id number] Date of Birth: 28-Nov-1934 Referring Provider:     Pulmonary Rehab Walk Test from 09/23/2016 in [place]  Referring Provider  Dr. Nelda Marseille      Encounter Date: 12/28/2016  Check In:     Session Check In - 12/28/16 1030      Check-In   Location [facility]   Staff Present Rodney Langton, RN;Joan Leonia Reeves, RN, Luisa Hart, RN, BSN   Supervising physician immediately available to respond to emergencies [facility] immediately available   Physician(s) Dr. Sloan Leiter   Medication changes reported     No   Fall or balance concerns reported    No   Tobacco Cessation No Change   Warm-up and Cool-down Performed as group-led instruction   Resistance Training Performed Yes   VAD Patient? No     Pain Assessment   Currently in Pain? No/denies      Capillary Blood Glucose: No results found for this or any previous visit (from the past 24 hour(s)).      Exercise Prescription Changes - 12/28/16 1226      Response to Exercise   Blood Pressure (Admit) 110/52   Blood Pressure (Exercise) 136/70   Blood Pressure (Exit) 94/60   Heart Rate (Admit) 64 bpm   Heart Rate (Exercise) 95 bpm   Heart Rate (Exit) 67 bpm   Oxygen Saturation (Admit) 91 %   Oxygen Saturation (Exercise) 86 %   Oxygen Saturation (Exit) 98 %   Rating of Perceived Exertion (Exercise) 13   Perceived Dyspnea (Exercise) 2   Duration Continue with 45 min of aerobic exercise without signs/symptoms of physical distress.   Intensity THRR unchanged     Progression   Progression Continue to progress workloads to maintain intensity without signs/symptoms of physical distress.     Resistance Training   Training Prescription Yes   Weight orange bands   Reps 10-15   Time 10 Minutes     Interval Training   Interval Training No     Oxygen   Oxygen Continuous   Liters 4-6     Bike   Level 4   Minutes 17     NuStep   Level 5   Minutes 17   METs 2.3     Track   Laps 14   Minutes 17      History  Smoking Status  . Former Smoker  . Packs/day: 2.00  . Years: 35.00  . Types: Cigarettes  . Quit date: 01/21/1989  Smokeless Tobacco  . Former Systems developer  . Types: Chew  . Quit date: 08/23/2001    Goals Met:  Independence with exercise equipment Improved SOB with ADL's Using PLB without cueing & demonstrates good technique Exercise tolerated well No report of cardiac concerns or symptoms Strength training completed today  Goals Unmet:  Not Applicable  Comments: Service time is from 1030 to 1215 [id number] one on one time was spent with patient reviewing PLB handout and monitoring return demonstration.   Dr. Rush Farmer is Medical Director for Pulmonary Rehab at [facility].

## 2016-12-28 NOTE — Telephone Encounter (Signed)
Received a VM from [facility] with [facility] . she need an order for pulmonary rehab maintenance for Dr. Samella Parr patient. The order number she left was [id number]. Patient is in the [facility] program and would like to transition to  the maintenance program. Cloyde Reams call back number is [phone]

## 2016-12-28 NOTE — Progress Notes (Signed)
Pulmonary Individual Treatment Plan  Patient Details  Name: DEONDREA MARKOS MRN: [id number] Date of Birth: 1935-03-09 Referring Provider:     Pulmonary Rehab Walk Test from 09/23/2016 in [place]  Referring Provider  Dr. Nelda Marseille      Initial Encounter Date:    Pulmonary Rehab Walk Test from 09/23/2016 in [facility]  Date  09/23/16  Referring Provider  Dr. Nelda Marseille      Visit Diagnosis: Acute on chronic systolic heart failure ([place])  Patient's Home Medications on Admission:   Current Outpatient Prescriptions:  .  albuterol (PROAIR HFA) 108 (90 Base) MCG/ACT inhaler, INHALE 2 PUFFS EVERY 4 HOURS AS NEEDED FOR SHORTNESS OF BREATH., Disp: 2 Inhaler, Rfl: 5 .  AMBULATORY NON FORMULARY MEDICATION, O2 2 Lpm continuous, Disp: , Rfl:  .  amiodarone (PACERONE) 200 MG tablet, Take 0.5 tablets (100 mg total) by mouth daily., Disp: 30 tablet, Rfl: 6 .  aspirin EC 81 MG tablet, Take 81 mg by mouth daily., Disp: , Rfl:  .  azelastine (ASTELIN) 0.1 % nasal spray, PLACE 2 SPRAYS INTO EACH NOSTRILS TWICE DAILY AS DIRECTED., Disp: 30 mL, Rfl: 0 .  Calcium Carbonate-Vitamin D (CALTRATE 600+D) 600-400 MG-UNIT tablet, Take 1 tablet by mouth daily., Disp: , Rfl:  .  ENTRESTO 49-51 MG, TAKE (1) TABLET BY MOUTH TWICE DAILY., Disp: 60 tablet, Rfl: 6 .  montelukast (SINGULAIR) 10 MG tablet, TAKE 1 TABLET BY MOUTH DAILY., Disp: 90 tablet, Rfl: 0 .  Multiple Vitamin (MULTIVITAMIN WITH MINERALS) TABS tablet, Take 1 tablet by mouth daily., Disp: , Rfl:  .  pantoprazole (PROTONIX) 40 MG tablet, TAKE ONE TABLET BY MOUTH DAILY., Disp: 90 tablet, Rfl: 3 .  polyethylene glycol (MIRALAX / GLYCOLAX) packet, Take 17 g by mouth daily as needed for mild constipation. , Disp: , Rfl:  .  SPIRIVA HANDIHALER 18 MCG inhalation capsule, INHALE 1 CAPSULE ONCE DAILY AS DIRECTED, Disp: 30 capsule, Rfl: 11 .  spironolactone (ALDACTONE) 25 MG tablet, Take 0.5 tablets (12.5 mg total)  by mouth daily., Disp: 16 tablet, Rfl: 6 .  SYMBICORT 160-4.5 MCG/ACT inhaler, INHALE 2 PUFFS FIRST THING IN MORNING AND THEN ANOTHER 2 PUFFS ABOUT 12 HOURS LATER., Disp: 10.2 g, Rfl: 3  Past Medical History: Past Medical History:  Diagnosis Date  . Allergic rhinitis   . Bronchiectasis   . CAD (coronary artery disease)   . Celiac disease   . Chronic heart failure ([place])   . Chronic kidney disease (CKD), stage III (moderate)   . COPD (chronic obstructive pulmonary disease) ([place])   . Diverticulosis of colon (without mention of hemorrhage)   . Esophageal reflux   . Family history of adverse reaction to anesthesia    daughter gets PONV  . Family hx of colon cancer   . History of stomach ulcers 1980s  . Hyperlipemia   . Hypertension   . 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 Myocardial infarction    "previous MI/echo in 12/2015"  . Nephrolithiasis    "got them now; never had OR/scopes" (02/03/2016)  . On home oxygen therapy    "2L usually; 3L last 3-4 days; 24/7" (08/06/2016)  . Other diseases of lung, not elsewhere classified   . Personal history of colonic polyps 04/26/2007   hyperplastic   . Pneumonia "several times"    Tobacco Use: History  Smoking Status  . Former Smoker  . Packs/day: 2.00  .  Years: 35.00  . Types: Cigarettes  . Quit date: 01/21/1989  Smokeless Tobacco  . Former Systems developer  . Types: Chew  . Quit date: 08/23/2001    Labs: Recent Review Flowsheet Data    Labs for ITP Cardiac and Pulmonary Rehab Latest Ref Rng & Units 02/04/2016 02/05/2016 02/06/2016 03/09/2016 08/06/2016   Cholestrol 125 - 200 mg/dL - - - 208(H) -   LDLCALC <130 mg/dL - - - 132(H) -   HDL >=40 mg/dL - - - 33(L) -   Trlycerides <150 mg/dL - - - 217(H) -   Hemoglobin A1c 4.8 - 5.6 % - 5.6 - - -   PHART 7.350 - 7.450 - - - - 7.447   PCO2ART 32.0 - 48.0 mmHg - - - - 33.8   HCO3 20.0 - 28.0 mmol/L - - - - 23.3   TCO2 0 - 100 mmol/L - - - - 24   ACIDBASEDEF 0.0 - 2.0  mmol/L - - - - -   O2SAT % 67.0 80.1 72.0 - 93.0      Capillary Blood Glucose: No results found for: GLUCAP   ADL UCSD:     Pulmonary Assessment Scores    Row Name 07/01/16 1649         ADL UCSD   ADL Phase Exit     SOB Score total 71        Pulmonary Function Assessment:     Pulmonary Function Assessment - 09/20/16 1025      Breath   Bilateral Breath Sounds Clear   Shortness of Breath Limiting activity;Panic with Shortness of Breath;Fear of Shortness of Breath;Yes      Exercise Target Goals:    Exercise Program Goal: Individual exercise prescription set with THRR, safety & activity barriers. Participant demonstrates ability to understand and report RPE using BORG scale, to self-measure pulse accurately, and to acknowledge the importance of the exercise prescription.  Exercise Prescription Goal: Starting with aerobic activity 30 plus minutes a day, 3 days per week for initial exercise prescription. Provide home exercise prescription and guidelines that participant acknowledges understanding prior to discharge.  Activity Barriers & Risk Stratification:     Activity Barriers & Cardiac Risk Stratification - 09/20/16 1019      Activity Barriers & Cardiac Risk Stratification   Activity Barriers None      6 Minute Walk:     6 Minute Walk    Row Name 07/06/16 1312 09/23/16 1657       6 Minute Walk   Phase Discharge Initial    Distance 1655 feet 1544 feet    Walk Time 6 minutes 6 minutes    # of Rest Breaks 0 0    MPH 3.13 2.92    METS 3.37 3.22    RPE 14 13    Perceived Dyspnea  3 2    Symptoms No  -    Resting HR 72 bpm 73 bpm    Resting BP 108/60 124/70    Max Ex. HR 99 bpm 99 bpm    Max Ex. BP 174/60 154/68    2 Minute Post BP 144/60  -      Interval HR   Baseline HR 72 73    1 Minute HR 96 65    2 Minute HR 96 78    3 Minute HR 81 83    4 Minute HR 81 88    5 Minute HR 90 93    6 Minute HR 99 99  2 Minute Post HR 85 85    Interval  Heart Rate? Yes Yes      Interval Oxygen   Interval Oxygen? Yes Yes    Baseline Oxygen Saturation % 93 % 92 %    Baseline Liters of Oxygen 4 L 4 L    1 Minute Oxygen Saturation % 91 % 92 %    1 Minute Liters of Oxygen 4 L 4 L    2 Minute Oxygen Saturation % 91 % 91 %    2 Minute Liters of Oxygen 4 L 4 L    3 Minute Oxygen Saturation % 86 % 90 %    3 Minute Liters of Oxygen 4 L 4 L    4 Minute Oxygen Saturation % 86 % 88 %    4 Minute Liters of Oxygen 6 L 4 L    5 Minute Oxygen Saturation % 85 % 87 %    5 Minute Liters of Oxygen 6 L 4 L    6 Minute Oxygen Saturation % 85 % 86 %    6 Minute Liters of Oxygen 6 L 6 L    2 Minute Post Oxygen Saturation % 92 % 91 %    2 Minute Post Liters of Oxygen 6 L 6 L       Oxygen Initial Assessment:     Oxygen Initial Assessment - 11/01/16 1036      Home Oxygen   Home Oxygen Device Portable Concentrator;Home Concentrator   Sleep Oxygen Prescription Continuous   Home Exercise Oxygen Prescription Continuous   Home at Rest Exercise Oxygen Prescription Continuous   Compliance with Home Oxygen Use Yes     Intervention   Short Term Goals To learn and exhibit compliance with exercise, home and travel O2 prescription;To learn and understand importance of monitoring SPO2 with pulse oximeter and demonstrate accurate use of the pulse oximeter.;To Learn and understand importance of maintaining oxygen saturations>88%;To learn and demonstrate proper purse lipped breathing techniques or other breathing techniques.;To learn and demonstrate proper use of respiratory medications   Long  Term Goals Exhibits compliance with exercise, home and travel O2 prescription;Verbalizes importance of monitoring SPO2 with pulse oximeter and return demonstration;Maintenance of O2 saturations>88%;Exhibits proper breathing techniques, such as purse lipped breathing or other method taught during program session;Compliance with respiratory medication;Demonstrates proper use of  MDI's      Oxygen Re-Evaluation:     Oxygen Re-Evaluation    Row Name 11/29/16 1618 12/28/16 0838           Program Oxygen Prescription   Program Oxygen Prescription Continuous Continuous      Liters per minute 4 4        Home Oxygen   Home Oxygen Device Portable Concentrator;Home Concentrator Portable Concentrator;Home Concentrator      Sleep Oxygen Prescription Continuous Continuous      Liters per minute 2 2      Home Exercise Oxygen Prescription Continuous Continuous      Liters per minute 4 4      Home at Rest Exercise Oxygen Prescription Continuous Continuous      Liters per minute 2 2      Compliance with Home Oxygen Use  - Yes        Goals/Expected Outcomes   Short Term Goals To learn and exhibit compliance with exercise, home and travel O2 prescription;To learn and understand importance of monitoring SPO2 with pulse oximeter and demonstrate accurate use of the pulse oximeter.;To Learn and understand importance  of maintaining oxygen saturations>88%;To learn and demonstrate proper purse lipped breathing techniques or other breathing techniques.;To learn and demonstrate proper use of respiratory medications To learn and exhibit compliance with exercise, home and travel O2 prescription;To learn and understand importance of monitoring SPO2 with pulse oximeter and demonstrate accurate use of the pulse oximeter.;To Learn and understand importance of maintaining oxygen saturations>88%;To learn and demonstrate proper purse lipped breathing techniques or other breathing techniques.;To learn and demonstrate proper use of respiratory medications      Long  Term Goals Exhibits compliance with exercise, home and travel O2 prescription;Verbalizes importance of monitoring SPO2 with pulse oximeter and return demonstration;Maintenance of O2 saturations>88%;Exhibits proper breathing techniques, such as purse lipped breathing or other method taught during program session;Compliance with  respiratory medication;Demonstrates proper use of MDI's Exhibits compliance with exercise, home and travel O2 prescription;Verbalizes importance of monitoring SPO2 with pulse oximeter and return demonstration;Maintenance of O2 saturations>88%;Exhibits proper breathing techniques, such as purse lipped breathing or other method taught during program session;Compliance with respiratory medication;Demonstrates proper use of MDI's      Goals/Expected Outcomes patient will verbalize compliance with home and program oxygen compliance patient will verbalize compliance with home and program oxygen compliance         Oxygen Discharge (Final Oxygen Re-Evaluation):     Oxygen Re-Evaluation - 12/28/16 0838      Program Oxygen Prescription   Program Oxygen Prescription Continuous   Liters per minute 4     Home Oxygen   Home Oxygen Device Portable Concentrator;Home Concentrator   Sleep Oxygen Prescription Continuous   Liters per minute 2   Home Exercise Oxygen Prescription Continuous   Liters per minute 4   Home at Rest Exercise Oxygen Prescription Continuous   Liters per minute 2   Compliance with Home Oxygen Use Yes     Goals/Expected Outcomes   Short Term Goals To learn and exhibit compliance with exercise, home and travel O2 prescription;To learn and understand importance of monitoring SPO2 with pulse oximeter and demonstrate accurate use of the pulse oximeter.;To Learn and understand importance of maintaining oxygen saturations>88%;To learn and demonstrate proper purse lipped breathing techniques or other breathing techniques.;To learn and demonstrate proper use of respiratory medications   Long  Term Goals Exhibits compliance with exercise, home and travel O2 prescription;Verbalizes importance of monitoring SPO2 with pulse oximeter and return demonstration;Maintenance of O2 saturations>88%;Exhibits proper breathing techniques, such as purse lipped breathing or other method taught during  program session;Compliance with respiratory medication;Demonstrates proper use of MDI's   Goals/Expected Outcomes patient will verbalize compliance with home and program oxygen compliance      Initial Exercise Prescription:     Initial Exercise Prescription - 09/23/16 1600      Date of Initial Exercise RX and Referring Provider   Date 09/23/16   Referring Provider Dr. Nelda Marseille     Oxygen   Oxygen Continuous   Liters 6     Bike   Level 0.5   Minutes 17     NuStep   Level 4   Minutes 17   METs 2     Track   Laps 10   Minutes 17     Prescription Details   Frequency (times per week) 2   Duration Progress to 45 minutes of aerobic exercise without signs/symptoms of physical distress     Intensity   THRR 40-80% of Max Heartrate 56-113   Ratings of Perceived Exertion 11-13   Perceived Dyspnea 0-4     Progression  Progression Continue progressive overload as per policy without signs/symptoms or physical distress.     Resistance Training   Training Prescription Yes   Weight orange bands   Reps 10-12      Perform Capillary Blood Glucose checks as needed.  Exercise Prescription Changes:     Exercise Prescription Changes    Row Name 07/01/16 1300 09/30/16 1200 10/05/16 1200 10/07/16 1200 10/12/16 1200     Response to Exercise   Blood Pressure (Admit) 104/56 110/60 110/50 120/70 106/60   Blood Pressure (Exercise) 136/76 126/70 108/60 140/70 120/62   Blood Pressure (Exit) 110/64 116/76 104/60 122/80 106/58   Heart Rate (Admit) 70 bpm 69 bpm 69 bpm 70 bpm 72 bpm   Heart Rate (Exercise) 78 bpm 86 bpm 82 bpm 72 bpm 77 bpm   Heart Rate (Exit) 63 bpm 69 bpm 75 bpm 65 bpm 69 bpm   Oxygen Saturation (Admit) 93 % 96 % 96 % 93 % 94 %   Oxygen Saturation (Exercise) 88 %  sat increased to 88% with rest 93 % 88 % 86 % 92 %   Oxygen Saturation (Exit) 95 % 96 % 95 % 93 % 94 %   Rating of Perceived Exertion (Exercise) 13 13 14 13 13    Perceived Dyspnea (Exercise) 2 1 2 2 2     Duration Progress to 45 minutes of aerobic exercise without signs/symptoms of physical distress Progress to 45 minutes of aerobic exercise without signs/symptoms of physical distress Progress to 45 minutes of aerobic exercise without signs/symptoms of physical distress Progress to 45 minutes of aerobic exercise without signs/symptoms of physical distress Progress to 45 minutes of aerobic exercise without signs/symptoms of physical distress   Intensity THRR unchanged THRR unchanged THRR unchanged THRR unchanged THRR unchanged     Progression   Progression Continue to progress workloads to maintain intensity without signs/symptoms of physical distress. Continue to progress workloads to maintain intensity without signs/symptoms of physical distress. Continue to progress workloads to maintain intensity without signs/symptoms of physical distress. Continue to progress workloads to maintain intensity without signs/symptoms of physical distress. Continue to progress workloads to maintain intensity without signs/symptoms of physical distress.     Resistance Training   Training Prescription Yes Yes Yes Yes Yes   Weight orange bands orange bands orange bands orange bands orange bands   Reps 10-12  10 minutes of strength training 10-12  10 minutes of strength training 10-12  10 minutes of strength training 10-12  10 minutes of strength training 10-12  10 minutes of strength training     Interval Training   Interval Training No No No No No     Oxygen   Oxygen Continuous Continuous Continuous Continuous Continuous   Liters 4 4 3 3 3      Bike   Level  - 3  scifit 3  scifit -  scifit 1  scifit   Minutes  - 17 17 - 17     NuStep   Level 5 4 4 4 4    Minutes 17 17 17 17 17    METs 2.5 2.3 2.3 2.1 2.2     Track   Laps 15  - 12 13 10    Minutes 17  - 17 17 17    Row Name 10/14/16 1200 10/19/16 1200 10/21/16 1200 10/28/16 1300 11/02/16 1200     Response to Exercise   Blood Pressure (Admit) 100/52  104/50 118/64 108/50 104/46   Blood Pressure (Exercise) 130/70 132/60 126/76 128/79 140/72  Blood Pressure (Exit) 122/76 100/64 144/70 110/70 118/70   Heart Rate (Admit) 63 bpm 70 bpm 70 bpm 68 bpm 65 bpm   Heart Rate (Exercise) 70 bpm 111 bpm 93 bpm 82 bpm 88 bpm   Heart Rate (Exit) 61 bpm 77 bpm 72 bpm 65 bpm 67 bpm   Oxygen Saturation (Admit) 93 % 93 % 94 % 95 % 96 %   Oxygen Saturation (Exercise) 91 % 87 % 88 % 93 % 86 %   Oxygen Saturation (Exit) 97 % 94 % 91 % 96 % 91 %   Rating of Perceived Exertion (Exercise) 13 13 13 13 13    Perceived Dyspnea (Exercise) 2 2 2 2 2    Duration Progress to 45 minutes of aerobic exercise without signs/symptoms of physical distress Progress to 45 minutes of aerobic exercise without signs/symptoms of physical distress Continue with 45 min of aerobic exercise without signs/symptoms of physical distress. Continue with 45 min of aerobic exercise without signs/symptoms of physical distress. Continue with 45 min of aerobic exercise without signs/symptoms of physical distress.   Intensity THRR unchanged THRR unchanged THRR unchanged THRR unchanged THRR unchanged     Progression   Progression Continue to progress workloads to maintain intensity without signs/symptoms of physical distress. Continue to progress workloads to maintain intensity without signs/symptoms of physical distress. Continue to progress workloads to maintain intensity without signs/symptoms of physical distress. Continue to progress workloads to maintain intensity without signs/symptoms of physical distress. Continue to progress workloads to maintain intensity without signs/symptoms of physical distress.     Resistance Training   Training Prescription Yes Yes Yes Yes Yes   Weight orange bands orange bands orange bands orange bands orange bands   Reps 10-12  10 minutes of strength training 10-15 10-15 10-15 10-15   Time  - 10 Minutes 10 Minutes 10 Minutes 10 Minutes     Interval Training    Interval Training No No No No No     Oxygen   Oxygen Continuous Continuous Continuous Continuous Continuous   Liters 3 3 3 3 3      Bike   Level 3  scifit 3  scifit 4 4 4    Minutes 17 17 17 17 17      NuStep   Level 4 4  - 4 4   Minutes 17 17  - 17 17   METs 2.3 2.6  - 2.2 2.7     Track   Laps - 10 11  - 12   Minutes - 17 17  - 17     Home Exercise Plan   Plans to continue exercise at  - Home (comment)  -  -  -   Frequency  - Add 3 additional days to program exercise sessions.  -  -  -   Initial Home Exercises Provided  - -  walking  -  -  -     Exercise Review   Progression Yes Yes Yes  -  -   Row Name 11/04/16 1200 11/09/16 1200 11/11/16 1200 11/16/16 1200 11/18/16 1300     Response to Exercise   Blood Pressure (Admit) 104/60 112/72 118/70 110/64 100/60   Blood Pressure (Exercise) 110/70 120/64 122/70 126/54 124/68   Blood Pressure (Exit) 120/70 108/70 102/58 126/74 110/70   Heart Rate (Admit) 71 bpm 61 bpm 68 bpm 72 bpm 68 bpm   Heart Rate (Exercise) 81 bpm 87 bpm 73 bpm 85 bpm 100 bpm   Heart Rate (Exit) 66 bpm  76 bpm 67 bpm 76 bpm 73 bpm   Oxygen Saturation (Admit) 94 % 94 % 94 % 94 % 92 %   Oxygen Saturation (Exercise) 82 %  4 liters on track-increased to 6l-92% 89 % 93 % 89 % 92 %   Oxygen Saturation (Exit) 94 % 97 % 96 % 95 % 92 %   Rating of Perceived Exertion (Exercise) 13 13 13 13 13    Perceived Dyspnea (Exercise) 2 2 2 2 2    Duration Continue with 45 min of aerobic exercise without signs/symptoms of physical distress. Continue with 45 min of aerobic exercise without signs/symptoms of physical distress. Continue with 45 min of aerobic exercise without signs/symptoms of physical distress. Continue with 45 min of aerobic exercise without signs/symptoms of physical distress. Continue with 45 min of aerobic exercise without signs/symptoms of physical distress.   Intensity THRR unchanged THRR unchanged THRR unchanged THRR unchanged THRR unchanged     Progression    Progression Continue to progress workloads to maintain intensity without signs/symptoms of physical distress. Continue to progress workloads to maintain intensity without signs/symptoms of physical distress. Continue to progress workloads to maintain intensity without signs/symptoms of physical distress. Continue to progress workloads to maintain intensity without signs/symptoms of physical distress. Continue to progress workloads to maintain intensity without signs/symptoms of physical distress.     Resistance Training   Training Prescription Yes Yes Yes Yes Yes   Weight orange bands orange bands orange bands orange bands orange bands   Reps 10-15 10-15 10-15 10-15 10-15   Time 10 Minutes 10 Minutes 10 Minutes 10 Minutes 10 Minutes     Interval Training   Interval Training No No No No No     Oxygen   Oxygen Continuous Continuous Continuous Continuous Continuous   Liters 3 3 3 3 3      Bike   Level  - 4 4 4 4    Minutes  - 17 17 17 17      NuStep   Level 4 4 4 4   -   Minutes 17 17 17 17   -   METs 2.2 2.2 2.2 2.3  -     Track   Laps 12 14  - 14 14   Minutes 17 17  - [place] Name 11/23/16 1212 11/25/16 1300 11/30/16 1226 12/02/16 1229 12/07/16 1200     Response to Exercise   Blood Pressure (Admit) 104/62 116/62 122/64 110/60 114/54   Blood Pressure (Exercise) 120/54 120/70 126/70 126/70 120/60   Blood Pressure (Exit) 112/82 112/60 114/60 120/64 118/70   Heart Rate (Admit) 71 bpm 68 bpm 72 bpm 68 bpm 62 bpm   Heart Rate (Exercise) 81 bpm 76 bpm 84 bpm 78 bpm 80 bpm   Heart Rate (Exit) 68 bpm 67 bpm 74 bpm 73 bpm 75 bpm   Oxygen Saturation (Admit) 93 % 95 % 96 % 93 % 94 %   Oxygen Saturation (Exercise) 86 %  increased to 91 with rest 93 % 87 % 88 % 89 %   Oxygen Saturation (Exit) 96 % 94 % 95 % 94 % 97 %   Rating of Perceived Exertion (Exercise) 13 13 13 13 13    Perceived Dyspnea (Exercise) 2 3 3  2.5 2   Duration Continue with 45 min of aerobic exercise without signs/symptoms  of physical distress. Continue with 45 min of aerobic exercise without signs/symptoms of physical distress. Continue with 45 min of aerobic exercise without signs/symptoms of physical distress. Continue  with 45 min of aerobic exercise without signs/symptoms of physical distress. Continue with 45 min of aerobic exercise without signs/symptoms of physical distress.   Intensity THRR unchanged THRR unchanged THRR unchanged THRR unchanged THRR unchanged     Progression   Progression Continue to progress workloads to maintain intensity without signs/symptoms of physical distress. Continue to progress workloads to maintain intensity without signs/symptoms of physical distress. Continue to progress workloads to maintain intensity without signs/symptoms of physical distress. Continue to progress workloads to maintain intensity without signs/symptoms of physical distress. Continue to progress workloads to maintain intensity without signs/symptoms of physical distress.     Resistance Training   Training Prescription Yes Yes Yes Yes Yes   Weight orange bands orange bands orange bands orange bands orange bands   Reps 10-15 10-15 10-15 10-15 10-15   Time 10 Minutes 10 Minutes 10 Minutes 10 Minutes 10 Minutes     Interval Training   Interval Training No No No No No     Oxygen   Oxygen Continuous Continuous Continuous Continuous Continuous   Liters 6 6 6 6 6      Bike   Level 4 4 4 4 4    Minutes 17 17 17 17 17      NuStep   Level 4 5 5   - 5   Minutes 17 17 17   - 17   METs 2.3 2.5 2.7  - 2.5     Track   Laps 14  - 14 15 14    Minutes 17  - 17 17 17    Row Name 12/09/16 1200 12/14/16 1200 12/23/16 1242         Response to Exercise   Blood Pressure (Admit) 104/60 110/62 100/60     Blood Pressure (Exercise) 130/64 110/60 120/66     Blood Pressure (Exit) 110/64 110/62 124/60     Heart Rate (Admit) 66 bpm 67 bpm 92 bpm     Heart Rate (Exercise) 89 bpm 87 bpm 85 bpm     Heart Rate (Exit) 67 bpm 76 bpm  66 bpm     Oxygen Saturation (Admit) 94 % 94 % 93 %     Oxygen Saturation (Exercise) 89 % 89 % 91 %     Oxygen Saturation (Exit) 95 % 95 % 95 %     Rating of Perceived Exertion (Exercise) 13 13 13      Perceived Dyspnea (Exercise) 2 2 3      Duration Continue with 45 min of aerobic exercise without signs/symptoms of physical distress. Continue with 45 min of aerobic exercise without signs/symptoms of physical distress. Continue with 45 min of aerobic exercise without signs/symptoms of physical distress.     Intensity THRR unchanged THRR unchanged THRR unchanged       Progression   Progression Continue to progress workloads to maintain intensity without signs/symptoms of physical distress. Continue to progress workloads to maintain intensity without signs/symptoms of physical distress. Continue to progress workloads to maintain intensity without signs/symptoms of physical distress.       Resistance Training   Training Prescription Yes Yes Yes     Weight orange bands orange bands orange bands     Reps 10-15 10-15 10-15     Time 10 Minutes 10 Minutes 10 Minutes       Interval Training   Interval Training No No No       Oxygen   Oxygen Continuous Continuous Continuous     Liters 6 6 6  Bike   Level  - 4  -     Minutes  - 17  -       NuStep   Level 5 5 5      Minutes 17 17 17      METs 2.3 2.5 2.4       Track   Laps 18 14 14      Minutes 17 17 17         Exercise Comments:     Exercise Comments    Row Name 07/06/16 1320 10/04/16 1515 10/19/16 1235       Exercise Comments Today was Taevon's last day in program. Patient is going to cont. to exercise at the [facility] near his home. Patient has only exercised one class. Will cont. to monitor and progress.  Home exercise completed        Exercise Goals and Review:   Exercise Goals Re-Evaluation :     Exercise Goals Re-Evaluation    Row Name 11/01/16 1615 11/29/16 1636 12/27/16 0714         Exercise Goal Re-Evaluation    Exercise Goals Review Increase Physical Activity;Increase Strenth and Stamina Increase Physical Activity;Increase Strenth and Stamina Increase Physical Activity;Increase Strenth and Stamina     Comments Patient is progressing well in program. He is walking at home on his off days from rehab. Will cont. to monitor and progress. Patient is progressing well in program. He is walking at home on his off days from rehab. Will cont. to monitor and progress. Patient is progressing well in program. Averaging 14 laps (200 feet each lap) in 15 minutes. He is walking at home on his off days from rehab. Has been out for a few sessions due to camping trip and then experiencing bouts of vertigo. Will cont. to monitor and progress. Patient has two sessions left.      Expected Outcomes Through the exercise here at rehab the patient with increase physical capacity, strength, and stamina. Through the exercise here at rehab the patient with increase physical capacity, strength, and stamina. Through exercise at rehab and home patient will increase physical capacity, stength, and stamina.        Discharge Exercise Prescription (Final Exercise Prescription Changes):     Exercise Prescription Changes - 12/23/16 1242      Response to Exercise   Blood Pressure (Admit) 100/60   Blood Pressure (Exercise) 120/66   Blood Pressure (Exit) 124/60   Heart Rate (Admit) 92 bpm   Heart Rate (Exercise) 85 bpm   Heart Rate (Exit) 66 bpm   Oxygen Saturation (Admit) 93 %   Oxygen Saturation (Exercise) 91 %   Oxygen Saturation (Exit) 95 %   Rating of Perceived Exertion (Exercise) 13   Perceived Dyspnea (Exercise) 3   Duration Continue with 45 min of aerobic exercise without signs/symptoms of physical distress.   Intensity THRR unchanged     Progression   Progression Continue to progress workloads to maintain intensity without signs/symptoms of physical distress.     Resistance Training   Training Prescription Yes   Weight  orange bands   Reps 10-15   Time 10 Minutes     Interval Training   Interval Training No     Oxygen   Oxygen Continuous   Liters 6     NuStep   Level 5   Minutes 17   METs 2.4     Track   Laps 14   Minutes 17      Nutrition:  Target  Goals: Understanding of nutrition guidelines, daily intake of sodium <1549m, cholesterol <2067m calories 30% from fat and 7% or less from saturated fats, daily to have 5 or more servings of fruits and vegetables.  Biometrics:     Pre Biometrics - 09/20/16 1029      Pre Biometrics   Grip Strength 33 kg       Nutrition Therapy Plan and Nutrition Goals:     Nutrition Therapy & Goals - 10/21/16 1230      Nutrition Therapy   Diet High Calorie, High Protein     Personal Nutrition Goals   Nutrition Goal Prevent further wt loss/ promote wt gain     Intervention Plan   Intervention Prescribe, educate and counsel regarding individualized specific dietary modifications aiming towards targeted core components such as weight, hypertension, lipid management, diabetes, heart failure and other comorbidities.   Expected Outcomes Short Term Goal: Understand basic principles of dietary content, such as calories, fat, sodium, cholesterol and nutrients.;Long Term Goal: Adherence to prescribed nutrition plan.      Nutrition Discharge: Rate Your Plate Scores:     Nutrition Assessments - 10/21/16 1230      Rate Your Plate Scores   Pre Score 41  Rate Your Plate goal < 49 due to pt need to gain wt/prevent wt loss      Nutrition Goals Re-Evaluation:     Nutrition Goals Re-Evaluation    Row Name 07/21/16 1042             Goals   Current Weight 133 lb (60.3 kg)       Nutrition Goal 1-2 lb wt gain per week to a wt gain goal of 6-24 lb       Comment Pt wt is up 4 lb since admission.          Personal Goal #1 Re-Evaluation   Goal Progress Seen Yes          Nutrition Goals Discharge (Final Nutrition Goals Re-Evaluation):      Nutrition Goals Re-Evaluation - 07/21/16 1042      Goals   Current Weight 133 lb (60.3 kg)   Nutrition Goal 1-2 lb wt gain per week to a wt gain goal of 6-24 lb   Comment Pt wt is up 4 lb since admission.      Personal Goal #1 Re-Evaluation   Goal Progress Seen Yes      Psychosocial: Target Goals: Acknowledge presence or absence of significant depression and/or stress, maximize coping skills, provide positive support system. Participant is able to verbalize types and ability to use techniques and skills needed for reducing stress and depression.  Initial Review & Psychosocial Screening:     Initial Psych Review & Screening - 09/20/16 1032      Initial Review   Current issues with --  none identified     Family Dynamics   Good Support System? Yes     Barriers   Psychosocial barriers to participate in program There are no identifiable barriers or psychosocial needs.     Screening Interventions   Interventions Encouraged to exercise      Quality of Life Scores:     Quality of Life - 07/01/16 1650      Quality of Life Scores   Health/Function Post 18.63 %   Socioeconomic Post 21.79 %   Psych/Spiritual Post 22.5 %   Family Post 22.25 %   GLOBAL Post 20.5 %      PHQ-9: Recent Review Flowsheet Data  Depression screen [facility] 2/9 11/18/2016 09/20/2016 07/06/2016 03/22/2016 04/16/2015   Decreased Interest 0 0 0 0 0   Down, Depressed, Hopeless 0 0 0 0 0   PHQ - 2 Score 0 0 0 0 0   Altered sleeping 0 - - - -   Tired, decreased energy 0 - - - -   Change in appetite 0 - - - -   Feeling bad or failure about yourself  0 - - - -   Trouble concentrating 0 - - - -   Moving slowly or fidgety/restless 0 - - - -   Suicidal thoughts 0 - - - -   PHQ-9 Score 0 - - - -   Difficult doing work/chores Not difficult at all - - - -     Interpretation of Total Score  Total Score Depression Severity:  1-4 = Minimal depression, 5-9 = Mild depression, 10-14 = Moderate depression, 15-19  = Moderately severe depression, 20-27 = Severe depression   Psychosocial Evaluation and Intervention:     Psychosocial Evaluation - 09/20/16 1033      Psychosocial Evaluation & Interventions   Continue Psychosocial Services  No      Psychosocial Re-Evaluation:     Psychosocial Re-Evaluation    Row Name 10/04/16 1547 11/01/16 1037 11/01/16 1216 11/29/16 1621 12/28/16 0839     Psychosocial Re-Evaluation   Current issues with  -  -  - None Identified None Identified   Comments no psychosocial barriers identified since admission no psychosocial barriers identified since admission  - no psychosocial barriers identified since admission no psychosocial barriers identified since admission   Expected Outcomes  -  - patient will remain free of psychosocial barriers patient will remain free of psychosocial barriers patient will remain free of psychosocial barriers   Interventions Encouraged to attend Pulmonary Rehabilitation for the exercise Encouraged to attend Pulmonary Rehabilitation for the exercise  - Encouraged to attend Pulmonary Rehabilitation for the exercise Encouraged to attend Pulmonary Rehabilitation for the exercise   Continue Psychosocial Services   - No Follow up required  - No Follow up required No Follow up required      Psychosocial Discharge (Final Psychosocial Re-Evaluation):     Psychosocial Re-Evaluation - 12/28/16 0839      Psychosocial Re-Evaluation   Current issues with None Identified   Comments no psychosocial barriers identified since admission   Expected Outcomes patient will remain free of psychosocial barriers   Interventions Encouraged to attend Pulmonary Rehabilitation for the exercise   Continue Psychosocial Services  No Follow up required      Education: Education Goals: Education classes will be provided on a weekly basis, covering required topics. Participant will state understanding/return demonstration of topics presented.  Learning  Barriers/Preferences:     Learning Barriers/Preferences - 09/20/16 1023      Learning Barriers/Preferences   Learning Barriers None   Learning Preferences Written Material;Video;Verbal Instruction;Individual Instruction;Group Instruction;Pictoral      Education Topics: Risk Factor Reduction:  -Group instruction that is supported by a PowerPoint presentation. Instructor discusses the definition of a risk factor, different risk factors for pulmonary disease, and how the heart and lungs work together.     PULMONARY REHAB OTHER RESPIRATORY from 12/23/2016 in [place]  Date  11/11/16  Educator  EP  Instruction Review Code  2- meets goals/outcomes      Nutrition for Pulmonary Patient:  -Group instruction provided by PowerPoint slides, verbal discussion, and written materials to support  subject matter. The instructor gives an explanation and review of healthy diet recommendations, which includes a discussion on weight management, recommendations for fruit and vegetable consumption, as well as protein, fluid, caffeine, fiber, sodium, sugar, and alcohol. Tips for eating when patients are short of breath are discussed.   PULMONARY REHAB OTHER RESPIRATORY from 12/23/2016 in [place]  Date  11/04/16  Educator  RD  Instruction Review Code  R- Review/reinforce      Pursed Lip Breathing:  -Group instruction that is supported by demonstration and informational handouts. Instructor discusses the benefits of pursed lip and diaphragmatic breathing and detailed demonstration on how to preform both.     PULMONARY REHAB OTHER RESPIRATORY from 12/23/2016 in [place]  Date  04/29/16  Educator  RT  Instruction Review Code  2- meets goals/outcomes      Oxygen Safety:  -Group instruction provided by PowerPoint, verbal discussion, and written material to support subject matter. There is an overview of "What is  Oxygen" and "Why do we need it".  Instructor also reviews how to create a safe environment for oxygen use, the importance of using oxygen as prescribed, and the risks of noncompliance. There is a brief discussion on traveling with oxygen and resources the patient may utilize.   PULMONARY REHAB OTHER RESPIRATORY from 12/23/2016 in [place]  Date  11/25/16  Educator  RN  Instruction Review Code  2- meets goals/outcomes      Oxygen Equipment:  -Group instruction provided by [facility] Staff utilizing handouts, written materials, and equipment demonstrations.   Signs and Symptoms:  -Group instruction provided by written material and verbal discussion to support subject matter. Warning signs and symptoms of infection, stroke, and heart attack are reviewed and when to call the physician/911 reinforced. Tips for preventing the spread of infection discussed.   PULMONARY REHAB OTHER RESPIRATORY from 12/23/2016 in [place]  Date  12/23/16  Educator  rn  Instruction Review Code  2- meets goals/outcomes      Advanced Directives:  -Group instruction provided by verbal instruction and written material to support subject matter. Instructor reviews Advanced Directive laws and proper instruction for filling out document.   Pulmonary Video:  -Group video education that reviews the importance of medication and oxygen compliance, exercise, good nutrition, pulmonary hygiene, and pursed lip and diaphragmatic breathing for the pulmonary patient.   PULMONARY REHAB OTHER RESPIRATORY from 12/23/2016 in [place]  Date  12/02/16  Instruction Review Code  R- Review/reinforce      Exercise for the Pulmonary Patient:  -Group instruction that is supported by a PowerPoint presentation. Instructor discusses benefits of exercise, core components of exercise, frequency, duration, and intensity of an exercise routine,  importance of utilizing pulse oximetry during exercise, safety while exercising, and options of places to exercise outside of rehab.     PULMONARY REHAB OTHER RESPIRATORY from 12/23/2016 in [place]  Date  12/09/16  Educator  EP  Instruction Review Code  R- Review/reinforce      Pulmonary Medications:  -Verbally interactive group education provided by instructor with focus on inhaled medications and proper administration.   PULMONARY REHAB OTHER RESPIRATORY from 12/23/2016 in [place]  Date  11/18/16  Educator  pharm  Instruction Review Code  2- meets goals/outcomes      Anatomy and Physiology of the Respiratory  System and Intimacy:  -Group instruction provided by PowerPoint, verbal discussion, and written material to support subject matter. Instructor reviews respiratory cycle and anatomical components of the respiratory system and their functions. Instructor also reviews differences in obstructive and restrictive respiratory diseases with examples of each. Intimacy, Sex, and Sexuality differences are reviewed with a discussion on how relationships can change when diagnosed with pulmonary disease. Common sexual concerns are reviewed.   PULMONARY REHAB OTHER RESPIRATORY from 12/23/2016 in [place]  Date  10/28/16  Educator  RN  Instruction Review Code  2- meets goals/outcomes      Knowledge Questionnaire Score:     Knowledge Questionnaire Score - 07/01/16 1649      Knowledge Questionnaire Score   Pre Score 9/13      Core Components/Risk Factors/Patient Goals at Admission:     Personal Goals and Risk Factors at Admission - 09/20/16 1029      Core Components/Risk Factors/Patient Goals on Admission   Increase Strength and Stamina Yes   Improve shortness of breath with ADL's Yes      Core Components/Risk Factors/Patient Goals Review:      Goals and Risk Factor Review    Row  Name 09/20/16 1032 10/04/16 1546 11/01/16 1036 11/29/16 1621 12/28/16 0839     Core Components/Risk Factors/Patient Goals Review   Personal Goals Review Increase Strength and Stamina;Improve shortness of breath with ADL's Increase Strength and Stamina;Improve shortness of breath with ADL's  - Develop more efficient breathing techniques such as purse lipped breathing and diaphragmatic breathing and practicing self-pacing with activity.;Improve shortness of breath with ADL's Develop more efficient breathing techniques such as purse lipped breathing and diaphragmatic breathing and practicing self-pacing with activity.;Improve shortness of breath with ADL's   Review  - see comments section on ITP see comments section on ITP see comments section on ITP see comments section on ITP   Expected Outcomes  - see admission expected outcomes see admission expected outcomes see admission expected outcomes see admission expected outcomes      Core Components/Risk Factors/Patient Goals at Discharge (Final Review):      Goals and Risk Factor Review - 12/28/16 0839      Core Components/Risk Factors/Patient Goals Review   Personal Goals Review Develop more efficient breathing techniques such as purse lipped breathing and diaphragmatic breathing and practicing self-pacing with activity.;Improve shortness of breath with ADL's   Review see comments section on ITP   Expected Outcomes see admission expected outcomes      ITP Comments:   Comments: ITP REVIEW Pt is making expected progress toward pulmonary rehab goals after completing 22 sessions. Recommend continued exercise, life style modification, education, and utilization of breathing techniques to increase stamina and strength and decrease shortness of breath with exertion.

## 2016-12-29 NOTE — Telephone Encounter (Signed)
Order placed

## 2016-12-30 ENCOUNTER — Encounter (HOSPITAL_COMMUNITY)
Admission: RE | Admit: 2016-12-30 | Discharge: 2016-12-30 | Disposition: A | Discharge status: Home / Self Care | Payer: Medicare Other | Source: Ambulatory Visit | Attending: Internal Medicine | Admitting: Internal Medicine

## 2016-12-30 VITALS — Wt 124.3 lb

## 2016-12-30 DIAGNOSIS — I5021 Acute systolic (congestive) heart failure: Secondary | ICD-10-CM | POA: Diagnosis not present

## 2016-12-30 DIAGNOSIS — I5023 Acute on chronic systolic (congestive) heart failure: Secondary | ICD-10-CM

## 2016-12-30 NOTE — Progress Notes (Signed)
Daily Session Note  Patient Details  Name: Bruce Travis MRN: [id number] Date of Birth: 1934-10-16 Referring Provider:     Pulmonary Rehab Walk Test from 09/23/2016 in [place]  Referring Provider  Dr. Nelda Marseille      Encounter Date: 12/30/2016  Check In:     Session Check In - 12/30/16 1107      Check-In   Location [facility]   Staff Present Trish Fountain, RN, Maxcine Ham, RN, Roque Cash, RN   Supervising physician immediately available to respond to emergencies [facility] immediately available   Physician(s) Dr. Wendee Beavers   Medication changes reported     No   Fall or balance concerns reported    No   Tobacco Cessation No Change   Warm-up and Cool-down Performed as group-led instruction   Resistance Training Performed Yes   VAD Patient? No     Pain Assessment   Currently in Pain? No/denies   Multiple Pain Sites No      Capillary Blood Glucose: No results found for this or any previous visit (from the past 24 hour(s)).      Exercise Prescription Changes - 12/30/16 1200      Response to Exercise   Blood Pressure (Admit) 100/60   Blood Pressure (Exercise) 120/70   Blood Pressure (Exit) 110/60   Heart Rate (Admit) 69 bpm   Heart Rate (Exercise) 73 bpm   Heart Rate (Exit) 62 bpm   Oxygen Saturation (Admit) 94 %   Oxygen Saturation (Exercise) 93 %   Oxygen Saturation (Exit) 96 %   Rating of Perceived Exertion (Exercise) 13   Perceived Dyspnea (Exercise) 2   Duration Continue with 45 min of aerobic exercise without signs/symptoms of physical distress.   Intensity THRR unchanged     Progression   Progression Continue to progress workloads to maintain intensity without signs/symptoms of physical distress.     Resistance Training   Training Prescription Yes   Weight orange bands   Reps 10-15   Time 10 Minutes     Interval Training   Interval Training No     Oxygen   Oxygen Continuous   Liters 4.6     Bike   Level 4   Minutes 17     NuStep   Level 5   Minutes 17   METs 1.1      History  Smoking Status  . Former Smoker  . Packs/day: 2.00  . Years: 35.00  . Types: Cigarettes  . Quit date: 01/21/1989  Smokeless Tobacco  . Former Systems developer  . Types: Chew  . Quit date: 08/23/2001    Goals Met:  Exercise tolerated well No report of cardiac concerns or symptoms Strength training completed today  Goals Unmet:  Not Applicable  Comments: Service time is from 1030 to 1230    Dr. Rush Farmer is Medical Director for Pulmonary Rehab at [facility].

## 2017-01-04 ENCOUNTER — Encounter (HOSPITAL_COMMUNITY)
Admission: RE | Admit: 2017-01-04 | Discharge: 2017-01-04 | Disposition: A | Discharge status: Home / Self Care | Payer: Medicare Other | Source: Ambulatory Visit | Attending: Internal Medicine | Admitting: Internal Medicine

## 2017-01-04 DIAGNOSIS — I5021 Acute systolic (congestive) heart failure: Secondary | ICD-10-CM | POA: Diagnosis not present

## 2017-01-04 DIAGNOSIS — I5023 Acute on chronic systolic (congestive) heart failure: Secondary | ICD-10-CM

## 2017-01-06 ENCOUNTER — Encounter (HOSPITAL_COMMUNITY): Payer: Medicare Other

## 2017-01-06 ENCOUNTER — Ambulatory Visit (INDEPENDENT_AMBULATORY_CARE_PROVIDER_SITE_OTHER): Payer: Medicare Other | Admitting: Physician Assistant

## 2017-01-06 ENCOUNTER — Encounter: Payer: Self-pay | Admitting: Physician Assistant

## 2017-01-06 VITALS — BP 130/68 | HR 91 | Temp 98.4°F | Resp 18 | Wt 128.6 lb

## 2017-01-06 DIAGNOSIS — J441 Chronic obstructive pulmonary disease with (acute) exacerbation: Secondary | ICD-10-CM | POA: Diagnosis not present

## 2017-01-06 MED ORDER — PREDNISONE 20 MG PO TABS: 20.0000 mg | ORAL_TABLET | Freq: Every day | ORAL | 0 refills | Status: DC

## 2017-01-06 MED ORDER — LEVOFLOXACIN 750 MG PO TABS: 750.0000 mg | ORAL_TABLET | Freq: Every day | ORAL | 0 refills | Status: DC

## 2017-01-06 NOTE — Progress Notes (Signed)
Patient ID: Bruce Travis MRN: [id number], DOB: [birth year], 82 y.o. Date of Encounter: 01/06/2017, 12:30 PM    Chief Complaint:  Chief Complaint  Patient presents with  . chest congestion    x3days     HPI: 82 y.o. year old male presents with above.   Reviewed his chart. Reviewed that he has a history of heart failure as well as COPD.  Also reviewed his office notes from here 08/03/16 and 08/06/16.  He reports that he has had increased chest congestion for the past few days. States that he is coughing up thick dark phlegm. States that he has not been blowing any mucus out of his nose. States that he has had no increased lower extremity edema or increased weight gain.  He is on Spiriva and Symbicort on a routine basis. He states that he uses albuterol 2 or 3 times per day all the time and has not had to increase the use of this over the last few days. He is wearing his nasal cannula oxygen.     Home Meds:   Outpatient Medications Prior to Visit  Medication Sig Dispense Refill  . albuterol (PROAIR HFA) 108 (90 Base) MCG/ACT inhaler INHALE 2 PUFFS EVERY 4 HOURS AS NEEDED FOR SHORTNESS OF BREATH. 2 Inhaler 5  . AMBULATORY NON FORMULARY MEDICATION O2 2 Lpm continuous    . amiodarone (PACERONE) 200 MG tablet Take 0.5 tablets (100 mg total) by mouth daily. 30 tablet 6  . aspirin EC 81 MG tablet Take 81 mg by mouth daily.    Marland Kitchen azelastine (ASTELIN) 0.1 % nasal spray PLACE 2 SPRAYS INTO EACH NOSTRILS TWICE DAILY AS DIRECTED. 30 mL 0  . Calcium Carbonate-Vitamin D (CALTRATE 600+D) 600-400 MG-UNIT tablet Take 1 tablet by mouth daily.    Marland Kitchen ENTRESTO 49-51 MG TAKE (1) TABLET BY MOUTH TWICE DAILY. 60 tablet 6  . montelukast (SINGULAIR) 10 MG tablet TAKE 1 TABLET BY MOUTH DAILY. 90 tablet 0  . Multiple Vitamin (MULTIVITAMIN WITH MINERALS) TABS tablet Take 1 tablet by mouth daily.    . pantoprazole (PROTONIX) 40 MG tablet TAKE ONE TABLET BY MOUTH DAILY. 90 tablet 3  . polyethylene glycol  (MIRALAX / GLYCOLAX) packet Take 17 g by mouth daily as needed for mild constipation.     Marland Kitchen SPIRIVA HANDIHALER 18 MCG inhalation capsule INHALE 1 CAPSULE ONCE DAILY AS DIRECTED 30 capsule 11  . spironolactone (ALDACTONE) 25 MG tablet Take 0.5 tablets (12.5 mg total) by mouth daily. 16 tablet 6  . SYMBICORT 160-4.5 MCG/ACT inhaler INHALE 2 PUFFS FIRST THING IN MORNING AND THEN ANOTHER 2 PUFFS ABOUT 12 HOURS LATER. 10.2 g 3   No facility-administered medications prior to visit.     Allergies:  Allergies  Allergen Reactions  . Adhesive [Tape] Other (See Comments)    Band-aids - tears skin off  . Codeine Other (See Comments)    Thought head was going to blow off  . Gluten Meal Other (See Comments)    Celiac disease      Review of Systems: See HPI for pertinent ROS. All other ROS negative.    Physical Exam: Blood pressure 130/68, pulse 91, temperature 98.4 F (36.9 C), temperature source Oral, resp. rate 18, weight 128 lb 9.6 oz (58.3 kg), SpO2 93 %., Body mass index is 20.45 kg/m. General:  Thin, elderly WM. Appears in no acute distress. HEENT: Normocephalic, atraumatic, eyes without discharge, sclera non-icteric, nares are without discharge. Bilateral auditory canals clear, TM's are  without perforation, pearly grey and translucent with reflective cone of light bilaterally. Oral cavity moist, posterior pharynx without exudate, erythema, peritonsillar abscess, or post nasal drip.  Neck: Supple. No thyromegaly. No lymphadenopathy. Lungs: He has distant decreased breath sounds throughout bilaterally. I hear no wheezes, no rhonchi, or rales on exam at this time. Heart: Regular rhythm. No murmurs, rubs, or gallops. Msk:  Strength and tone normal for age. Extremities/Skin: Warm and dry. No LE edema. Neuro: Alert and oriented X 3. Moves all extremities spontaneously. Gait is normal. CNII-XII grossly in tact. Psych:  Responds to questions appropriately with a normal affect.     ASSESSMENT  AND PLAN:  82 y.o. year old male with  1. COPD exacerbation ([place]) I hear no wheezes on exam but will go ahead and give prednisone 20 mg daily to help prevent worsening of COPD exacerbation. As well, will treat with antibiotic. He is to follow-up immediately if symptoms worsen or if they do not return back to baseline upon completion of these medications. Will continue this Spariva in the Symbicort and the albuterol and oxygen. - levofloxacin (LEVAQUIN) 750 MG tablet; Take 1 tablet (750 mg total) by mouth daily.  Dispense: 7 tablet; Refill: 0 - predniSONE (DELTASONE) 20 MG tablet; Take 1 tablet (20 mg total) by mouth daily with breakfast.  Dispense: 5 tablet; Refill: 0   Signed, [address] [place], [place], [facility] 01/06/2017 12:30 PM

## 2017-01-11 ENCOUNTER — Encounter (HOSPITAL_COMMUNITY): Payer: Medicare Other

## 2017-01-12 ENCOUNTER — Other Ambulatory Visit: Payer: Self-pay | Admitting: Family Medicine

## 2017-01-19 ENCOUNTER — Telehealth (HOSPITAL_COMMUNITY): Payer: Self-pay

## 2017-01-27 ENCOUNTER — Telehealth: Payer: Self-pay | Admitting: Family Medicine

## 2017-01-27 ENCOUNTER — Ambulatory Visit
Admission: RE | Admit: 2017-01-27 | Discharge: 2017-01-27 | Disposition: A | Discharge status: Home / Self Care | Payer: Medicare Other | Source: Ambulatory Visit | Attending: Family Medicine | Admitting: Family Medicine

## 2017-01-27 ENCOUNTER — Ambulatory Visit (INDEPENDENT_AMBULATORY_CARE_PROVIDER_SITE_OTHER): Payer: Medicare Other | Admitting: Family Medicine

## 2017-01-27 ENCOUNTER — Encounter: Payer: Self-pay | Admitting: Family Medicine

## 2017-01-27 VITALS — BP 140/80 | HR 80 | Temp 97.8°F | Resp 22 | Ht 66.5 in | Wt 127.0 lb

## 2017-01-27 DIAGNOSIS — R042 Hemoptysis: Secondary | ICD-10-CM | POA: Diagnosis not present

## 2017-01-27 MED ORDER — AMOXICILLIN 500 MG PO TABS: 1000.0000 mg | ORAL_TABLET | Freq: Three times a day (TID) | ORAL | 0 refills | Status: DC

## 2017-01-27 MED ORDER — DOXYCYCLINE HYCLATE 100 MG PO TABS: 100.0000 mg | ORAL_TABLET | Freq: Two times a day (BID) | ORAL | 0 refills | Status: DC

## 2017-01-27 MED ORDER — PREDNISONE 20 MG PO TABS: ORAL_TABLET | ORAL | 0 refills | Status: DC

## 2017-01-27 NOTE — Progress Notes (Signed)
Subjective:    Patient ID: Bruce Travis, male    DOB: [birth year], 82 y.o.   MRN: [id number]  HPI  Over the last 2 days, the patient has had a worsening cough productive of blood-tinged sputum. He has a history of throat cancer and saw his ENT physician yesterday where examination revealed no recurrence of the cancer or explanation for the blood-tinged sputum. He does report worsening cough and increasing shortness of breath over the last few days. Has a history of chronic respiratory failure and oxygen dependency due to COPD. Patient exacerbated some and decompensates rapidly usually requiring hospitalization. Today overall he appears at his baseline. His exam is at his baseline. Past Medical History:  Diagnosis Date  . Allergic rhinitis   . Bronchiectasis   . CAD (coronary artery disease)   . Celiac disease   . Chronic heart failure ([place])   . Chronic kidney disease (CKD), stage III (moderate)   . COPD (chronic obstructive pulmonary disease) ([place])   . Diverticulosis of colon (without mention of hemorrhage)   . Esophageal reflux   . Family history of adverse reaction to anesthesia    daughter gets PONV  . Family hx of colon cancer   . History of stomach ulcers 1980s  . Hyperlipemia   . Hypertension   . 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 Myocardial infarction [facility])    "previous MI/echo in 12/2015"  . Nephrolithiasis    "got them now; never had OR/scopes" (02/03/2016)  . On home oxygen therapy    "2L usually; 3L last 3-4 days; 24/7" (08/06/2016)  . Other diseases of lung, not elsewhere classified   . Personal history of colonic polyps 04/26/2007   hyperplastic   . Pneumonia "several times"   Past Surgical History:  Procedure Laterality Date  . CARDIAC CATHETERIZATION  02/03/2016  . CARDIAC CATHETERIZATION  09/30/2009   non-obstructive CAD w/30% narrowing in prox LAD (Dr. Waunita Schooner)  . CARDIAC CATHETERIZATION  05/04/2001   same at 2011 cath (Dr. Waunita Schooner)  . CARDIAC CATHETERIZATION N/A 02/03/2016   Procedure: Right/Left Heart Cath and Coronary Angiography;  Surgeon: Pixie Casino, MD;  Location: [place] [facility];  Service: Cardiovascular;  Laterality: N/A;  . EPIGLOTOPLASTY W/ MLB     removal due to carcinoma  . EXCISIONAL HEMORRHOIDECTOMY  2000s  . HAND SURGERY Right 1958   d/t crush injury  . TONSILLECTOMY AND ADENOIDECTOMY    . ULTRASOUND GUIDANCE FOR VASCULAR ACCESS  02/03/2016   Procedure: Ultrasound Guidance For Vascular Access;  Surgeon: Pixie Casino, MD;  Location: [facility];  Service: Cardiovascular;;  . VASECTOMY     Current Outpatient Prescriptions on File Prior to Visit  Medication Sig Dispense Refill  . albuterol (PROAIR HFA) 108 (90 Base) MCG/ACT inhaler INHALE 2 PUFFS EVERY 4 HOURS AS NEEDED FOR SHORTNESS OF BREATH. 2 Inhaler 5  . AMBULATORY NON FORMULARY MEDICATION O2 2 Lpm continuous    . amiodarone (PACERONE) 200 MG tablet Take 0.5 tablets (100 mg total) by mouth daily. 30 tablet 6  . aspirin EC 81 MG tablet Take 81 mg by mouth daily.    Marland Kitchen azelastine (ASTELIN) 0.1 % nasal spray PLACE 2 SPRAYS INTO EACH NOSTRILS TWICE DAILY AS DIRECTED. 30 mL 0  . Calcium Carbonate-Vitamin D (CALTRATE 600+D) 600-400 MG-UNIT tablet Take 1 tablet by mouth daily.    Marland Kitchen ENTRESTO 49-51 MG TAKE (1) TABLET BY MOUTH TWICE DAILY. [place]  tablet 6  . montelukast (SINGULAIR) 10 MG tablet TAKE 1 TABLET BY MOUTH DAILY. 90 tablet 0  . Multiple Vitamin (MULTIVITAMIN WITH MINERALS) TABS tablet Take 1 tablet by mouth daily.    . pantoprazole (PROTONIX) 40 MG tablet TAKE ONE TABLET BY MOUTH DAILY. 90 tablet 3  . polyethylene glycol (MIRALAX / GLYCOLAX) packet Take 17 g by mouth daily as needed for mild constipation.     Marland Kitchen SPIRIVA HANDIHALER 18 MCG inhalation capsule INHALE 1 CAPSULE ONCE DAILY AS DIRECTED 30 capsule 11  . spironolactone (ALDACTONE) 25 MG tablet Take 0.5 tablets (12.5 mg total) by mouth daily. 16 tablet 6  . SYMBICORT  160-4.5 MCG/ACT inhaler INHALE 2 PUFFS FIRST THING IN MORNING AND THEN ANOTHER 2 PUFFS ABOUT 12 HOURS LATER. 10.2 g 11   No current facility-administered medications on file prior to visit.    Allergies  Allergen Reactions  . Adhesive [Tape] Other (See Comments)    Band-aids - tears skin off  . Codeine Other (See Comments)    Thought head was going to blow off  . Gluten Meal Other (See Comments)    Celiac disease   Social History   Social History  . Marital status: Married    Spouse name: N/A  . Number of children: 3  . Years of education: N/A   Occupational History  . retired Retired    Psychologist, sport and exercise and Administrator   Social History Main Topics  . Smoking status: Former Smoker    Packs/day: 2.00    Years: 35.00    Types: Cigarettes    Quit date: 01/21/1989  . Smokeless tobacco: Former Systems developer    Types: Chew    Quit date: 08/23/2001  . Alcohol use No  . Drug use: No  . Sexual activity: Not on file   Other Topics Concern  . Not on file   Social History Narrative  . No narrative on file      Review of Systems  All other systems reviewed and are negative.      Objective:   Physical Exam  HENT:  Right Ear: Tympanic membrane and ear canal normal.  Left Ear: Tympanic membrane and ear canal normal.  Nose: No mucosal edema or rhinorrhea.  Cardiovascular: Normal rate, regular rhythm and normal heart sounds.   No murmur heard. Pulmonary/Chest: Accessory muscle usage present. Tachypnea noted. He has decreased breath sounds. He has wheezes. He has no rhonchi. He has no rales. He exhibits no tenderness.  Vitals reviewed.         Assessment & Plan:  Hemoptysis - Plan: doxycycline (VIBRA-TABS) 100 MG tablet, predniSONE (DELTASONE) 20 MG tablet, DG Chest 2 View  Given his past history, I will obtain a chest x-ray to evaluate for any obvious malignancy. Cover the patient for possible upper respiratory infection given his hemoptysis and shortness of breath with doxycycline 100  mg by mouth twice a day for 10 days and a prednisone taper pack to cover for COPD exacerbation. Temporarily discontinue aspirin. Recheck next week. If hemoptysis persists, proceed with a chest CT and possible bronchoscopy if worsening. If resolving spontaneously and chest x-ray is clear no further workup will be necessary at that time

## 2017-01-27 NOTE — Telephone Encounter (Signed)
Patient aware of results.

## 2017-01-27 NOTE — Telephone Encounter (Signed)
Patient calling about results for xray done today [phone]

## 2017-01-31 NOTE — Addendum Note (Signed)
Encounter addended by: [name], RD on: 01/31/2017  3:35 PM<BR>    Actions taken: Flowsheet data copied forward, Visit Navigator Flowsheet section accepted

## 2017-02-01 ENCOUNTER — Encounter (HOSPITAL_COMMUNITY): Payer: Self-pay

## 2017-02-01 ENCOUNTER — Other Ambulatory Visit: Payer: Self-pay | Admitting: Family Medicine

## 2017-02-01 DIAGNOSIS — I5023 Acute on chronic systolic (congestive) heart failure: Secondary | ICD-10-CM

## 2017-02-01 NOTE — Progress Notes (Signed)
Discharge Summary  Patient Details  Name: Bruce Travis MRN: [id number] Date of Birth: 08/11/1935 Referring Provider:     Pulmonary Rehab Walk Test from 09/23/2016 in [place]  Referring Provider  Dr. Nelda Marseille       Number of Visits: 24   Reason for Discharge:  Patient reached a stable level of exercise. Patient independent in their exercise.  Smoking History:  History  Smoking Status  . Former Smoker  . Packs/day: 2.00  . Years: 35.00  . Types: Cigarettes  . Quit date: 01/21/1989  Smokeless Tobacco  . Former Systems developer  . Types: Chew  . Quit date: 08/23/2001    Diagnosis:  Acute on chronic systolic heart failure [facility])  ADL UCSD:     Pulmonary Assessment Scores    Row Name 12/28/16 0910         ADL UCSD   ADL Phase Exit     SOB Score total 70        Initial Exercise Prescription:   Discharge Exercise Prescription (Final Exercise Prescription Changes):     Exercise Prescription Changes - 12/30/16 1200      Response to Exercise   Blood Pressure (Admit) 100/60   Blood Pressure (Exercise) 120/70   Blood Pressure (Exit) 110/60   Heart Rate (Admit) 69 bpm   Heart Rate (Exercise) 73 bpm   Heart Rate (Exit) 62 bpm   Oxygen Saturation (Admit) 94 %   Oxygen Saturation (Exercise) 93 %   Oxygen Saturation (Exit) 96 %   Rating of Perceived Exertion (Exercise) 13   Perceived Dyspnea (Exercise) 2   Duration Continue with 45 min of aerobic exercise without signs/symptoms of physical distress.   Intensity THRR unchanged     Progression   Progression Continue to progress workloads to maintain intensity without signs/symptoms of physical distress.     Resistance Training   Training Prescription Yes   Weight orange bands   Reps 10-15   Time 10 Minutes     Interval Training   Interval Training No     Oxygen   Oxygen Continuous   Liters 4.6     Bike   Level 4   Minutes 17     NuStep   Level 5   Minutes 17   METs 1.1       Functional Capacity:     6 Minute Walk    Row Name 01/06/17 0701         6 Minute Walk   Phase Discharge     Distance 1610 feet     Walk Time 6 minutes     # of Rest Breaks 0     MPH 3.04     METS 3.3     RPE 13     Perceived Dyspnea  2     Symptoms No     Resting HR 73 bpm     Resting BP 104/70     Max Ex. HR 92 bpm     Max Ex. BP 164/66       Interval HR   Baseline HR 75     1 Minute HR 76     2 Minute HR 80     3 Minute HR 82     4 Minute HR 84     5 Minute HR 86     6 Minute HR 92     2 Minute Post HR 80     Interval Heart Rate? Yes  Interval Oxygen   Interval Oxygen? Yes     Baseline Oxygen Saturation % 95 %     Baseline Liters of Oxygen 6 L     1 Minute Oxygen Saturation % 95 %     1 Minute Liters of Oxygen 6 L     2 Minute Oxygen Saturation % 89 %     2 Minute Liters of Oxygen 6 L     3 Minute Oxygen Saturation % 89 %     3 Minute Liters of Oxygen 6 L     4 Minute Oxygen Saturation % 88 %     4 Minute Liters of Oxygen 6 L     5 Minute Oxygen Saturation % 87 %     5 Minute Liters of Oxygen 6 L     6 Minute Oxygen Saturation % 87 %     6 Minute Liters of Oxygen 6 L     2 Minute Post Oxygen Saturation % 91 %     2 Minute Post Liters of Oxygen 6 L        Psychological, QOL, Others - Outcomes: PHQ 2/9: Depression screen [facility] 2/9 01/04/2017 11/18/2016 09/20/2016 07/06/2016 03/22/2016  Decreased Interest 0 0 0 0 0  Down, Depressed, Hopeless 0 0 0 0 0  PHQ - 2 Score 0 0 0 0 0  Altered sleeping - 0 - - -  Tired, decreased energy - 0 - - -  Change in appetite - 0 - - -  Feeling bad or failure about yourself  - 0 - - -  Trouble concentrating - 0 - - -  Moving slowly or fidgety/restless - 0 - - -  Suicidal thoughts - 0 - - -  PHQ-9 Score - 0 - - -  Difficult doing work/chores - Not difficult at all - - -  Some recent data might be hidden    Quality of Life:     Quality of Life - 12/28/16 0911      Quality of Life Scores   Health/Function  Post 17.5 %   Socioeconomic Post 22.19 %   Psych/Spiritual Post 20.36 %   Family Post 17.75 %   GLOBAL Post 19.22 %      Personal Goals: Goals established at orientation with interventions provided to work toward goal.    Personal Goals Discharge:     Goals and Risk Factor Review    Row Name 12/28/16 0839 02/01/17 0720           Core Components/Risk Factors/Patient Goals Review   Personal Goals Review Develop more efficient breathing techniques such as purse lipped breathing and diaphragmatic breathing and practicing self-pacing with activity.;Improve shortness of breath with ADL's  -      Review see comments section on ITP Patient met all goals at discharge      Expected Outcomes see admission expected outcomes  -         Nutrition & Weight - Outcomes:      Post Biometrics - 01/06/17 0703       Post  Biometrics   Grip Strength 34 kg      Nutrition:   Nutrition Discharge:     Nutrition Assessments - 12/30/16 1339      Rate Your Plate Scores   Pre Score 41  Rate Your Plate goal < 49 due to pt need to gain wt/prevent wt loss   Post Score 48  Rate Your Plate goal < 49 due to pt  need to gain wt/prevent wt loss      Education Questionnaire Score:     Knowledge Questionnaire Score - 12/28/16 0910      Knowledge Questionnaire Score   Post Score 12/13      Goals reviewed with patient; copy given to patient. Patient plans to return to the pulmonary rehab maintenance program to continue his exercise post discharge.

## 2017-02-04 ENCOUNTER — Encounter: Payer: Self-pay | Admitting: Family Medicine

## 2017-02-04 ENCOUNTER — Ambulatory Visit (INDEPENDENT_AMBULATORY_CARE_PROVIDER_SITE_OTHER): Payer: Medicare Other | Admitting: Family Medicine

## 2017-02-04 VITALS — BP 140/68 | HR 88 | Temp 97.8°F | Resp 22 | Ht 66.5 in | Wt 127.0 lb

## 2017-02-04 DIAGNOSIS — R042 Hemoptysis: Secondary | ICD-10-CM

## 2017-02-04 NOTE — Progress Notes (Signed)
Subjective:    Patient ID: Bruce Travis, male    DOB: [birth year], 82 y.o.   MRN: [id number]  HPI  01/27/17 Over the last 2 days, the patient has had a worsening cough productive of blood-tinged sputum. He has a history of throat cancer and saw his ENT physician yesterday where examination revealed no recurrence of the cancer or explanation for the blood-tinged sputum. He does report worsening cough and increasing shortness of breath over the last few days. Has a history of chronic respiratory failure and oxygen dependency due to COPD. Patient exacerbated some and decompensates rapidly usually requiring hospitalization. Today overall he appears at his baseline. His exam is at his baseline.  At that time, my plan was: Given his past history, I will obtain a chest x-ray to evaluate for any obvious malignancy. Cover the patient for possible upper respiratory infection given his hemoptysis and shortness of breath with doxycycline 100 mg by mouth twice a day for 10 days and a prednisone taper pack to cover for COPD exacerbation. Temporarily discontinue aspirin. Recheck next week. If hemoptysis persists, proceed with a chest CT and possible bronchoscopy if worsening. If resolving spontaneously and chest x-ray is clear no further workup will be necessary at that time  02/04/17 Chest x-ray suggested left hilar infiltrate and left upper lobe infiltrate. Patient was treated with antibiotics for CAP as well as prednisone for community-acquired pneumonia as well as COPD exacerbation. The hemoptysis gradually improved and has resolved over the last week. Patient's breathing is back to his baseline Past Medical History:  Diagnosis Date  . Allergic rhinitis   . Bronchiectasis   . CAD (coronary artery disease)   . Celiac disease   . Chronic heart failure ([place])   . Chronic kidney disease (CKD), stage III (moderate)   . COPD (chronic obstructive pulmonary disease) ([place])   . Diverticulosis of colon (without mention of  hemorrhage)   . Esophageal reflux   . Family history of adverse reaction to anesthesia    daughter gets PONV  . Family hx of colon cancer   . History of stomach ulcers 1980s  . Hyperlipemia   . Hypertension   . 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 Myocardial infarction [facility])    "previous MI/echo in 12/2015"  . Nephrolithiasis    "got them now; never had OR/scopes" (02/03/2016)  . On home oxygen therapy    "2L usually; 3L last 3-4 days; 24/7" (08/06/2016)  . Other diseases of lung, not elsewhere classified   . Personal history of colonic polyps 04/26/2007   hyperplastic   . Pneumonia "several times"   Past Surgical History:  Procedure Laterality Date  . CARDIAC CATHETERIZATION  02/03/2016  . CARDIAC CATHETERIZATION  09/30/2009   non-obstructive CAD w/30% narrowing in prox LAD (Dr. Waunita Schooner)  . CARDIAC CATHETERIZATION  05/04/2001   same at 2011 cath (Dr. Waunita Schooner)  . CARDIAC CATHETERIZATION N/A 02/03/2016   Procedure: Right/Left Heart Cath and Coronary Angiography;  Surgeon: Pixie Casino, MD;  Location: [place] [facility];  Service: Cardiovascular;  Laterality: N/A;  . EPIGLOTOPLASTY W/ MLB     removal due to carcinoma  . EXCISIONAL HEMORRHOIDECTOMY  2000s  . HAND SURGERY Right 1958   d/t crush injury  . TONSILLECTOMY AND ADENOIDECTOMY    . ULTRASOUND GUIDANCE FOR VASCULAR ACCESS  02/03/2016   Procedure: Ultrasound Guidance For Vascular Access;  Surgeon: Pixie Casino, MD;  Location: [facility] IN[facility]  [facility];  Service: Cardiovascular;;  . VASECTOMY     Current Outpatient Prescriptions on File Prior to Visit  Medication Sig Dispense Refill  . albuterol (PROAIR HFA) 108 (90 Base) MCG/ACT inhaler INHALE 2 PUFFS EVERY 4 HOURS AS NEEDED FOR SHORTNESS OF BREATH. 2 Inhaler 5  . AMBULATORY NON FORMULARY MEDICATION O2 2 Lpm continuous    . amiodarone (PACERONE) 200 MG tablet Take 0.5 tablets (100 mg total) by mouth daily. 30 tablet 6  . amoxicillin (AMOXIL)  500 MG tablet Take 2 tablets (1,000 mg total) by mouth 3 (three) times daily. 60 tablet 0  . aspirin EC 81 MG tablet Take 81 mg by mouth daily.    Marland Kitchen azelastine (ASTELIN) 0.1 % nasal spray PLACE 2 SPRAYS INTO EACH NOSTRILS TWICE DAILY AS DIRECTED. 30 mL 0  . Calcium Carbonate-Vitamin D (CALTRATE 600+D) 600-400 MG-UNIT tablet Take 1 tablet by mouth daily.    Marland Kitchen doxycycline (VIBRA-TABS) 100 MG tablet Take 1 tablet (100 mg total) by mouth 2 (two) times daily. 20 tablet 0  . ENTRESTO 49-51 MG TAKE (1) TABLET BY MOUTH TWICE DAILY. 60 tablet 6  . montelukast (SINGULAIR) 10 MG tablet TAKE 1 TABLET BY MOUTH DAILY. 90 tablet 0  . Multiple Vitamin (MULTIVITAMIN WITH MINERALS) TABS tablet Take 1 tablet by mouth daily.    . pantoprazole (PROTONIX) 40 MG tablet TAKE ONE TABLET BY MOUTH DAILY. 90 tablet 3  . polyethylene glycol (MIRALAX / GLYCOLAX) packet Take 17 g by mouth daily as needed for mild constipation.     Marland Kitchen SPIRIVA HANDIHALER 18 MCG inhalation capsule INHALE 1 CAPSULE ONCE DAILY AS DIRECTED 30 capsule 11  . spironolactone (ALDACTONE) 25 MG tablet Take 0.5 tablets (12.5 mg total) by mouth daily. 16 tablet 6  . SYMBICORT 160-4.5 MCG/ACT inhaler INHALE 2 PUFFS FIRST THING IN MORNING AND THEN ANOTHER 2 PUFFS ABOUT 12 HOURS LATER. 10.2 g 11   No current facility-administered medications on file prior to visit.    Allergies  Allergen Reactions  . Adhesive [Tape] Other (See Comments)    Band-aids - tears skin off  . Codeine Other (See Comments)    Thought head was going to blow off  . Gluten Meal Other (See Comments)    Celiac disease   Social History   Social History  . Marital status: Married    Spouse name: N/A  . Number of children: 3  . Years of education: N/A   Occupational History  . retired Retired    Psychologist, sport and exercise and Administrator   Social History Main Topics  . Smoking status: Former Smoker    Packs/day: 2.00    Years: 35.00    Types: Cigarettes    Quit date: 01/21/1989  . Smokeless  tobacco: Former Systems developer    Types: Chew    Quit date: 08/23/2001  . Alcohol use No  . Drug use: No  . Sexual activity: Not on file   Other Topics Concern  . Not on file   Social History Narrative  . No narrative on file      Review of Systems  All other systems reviewed and are negative.      Objective:   Physical Exam  HENT:  Right Ear: Tympanic membrane and ear canal normal.  Left Ear: Tympanic membrane and ear canal normal.  Nose: No mucosal edema or rhinorrhea.  Cardiovascular: Normal rate, regular rhythm and normal heart sounds.   No murmur heard. Pulmonary/Chest: Accessory muscle usage present. Tachypnea noted. He has  decreased breath sounds. He has wheezes. He has no rhonchi. He has no rales. He exhibits no tenderness.  Vitals reviewed.         Assessment & Plan:  Hemoptysis - Plan: CT Chest W Contrast  Hemoptysis did linger for approximately 1 week and is just now improving. Given his previous history of malignancy, his long smoking history, and his lack of any true symptoms of pneumonia, I will obtain a CT scan of the chest to rule out lung cancer. Pain for the patient is gradually starting to improve

## 2017-02-11 ENCOUNTER — Ambulatory Visit (INDEPENDENT_AMBULATORY_CARE_PROVIDER_SITE_OTHER): Payer: Medicare Other | Admitting: Family Medicine

## 2017-02-11 ENCOUNTER — Encounter: Payer: Self-pay | Admitting: Family Medicine

## 2017-02-11 VITALS — BP 144/66 | HR 78 | Temp 98.3°F | Resp 22 | Ht 66.5 in | Wt 126.0 lb

## 2017-02-11 DIAGNOSIS — I499 Cardiac arrhythmia, unspecified: Secondary | ICD-10-CM

## 2017-02-11 DIAGNOSIS — J441 Chronic obstructive pulmonary disease with (acute) exacerbation: Secondary | ICD-10-CM | POA: Diagnosis not present

## 2017-02-11 LAB — BASIC METABOLIC PANEL WITH GFR
BUN: 19 mg/dL (ref 7–25)
CALCIUM: 9 mg/dL (ref 8.6–10.3)
CHLORIDE: 106 mmol/L (ref 98–110)
CO2: 19 mmol/L — ABNORMAL LOW (ref 20–31)
CREATININE: 1.11 mg/dL (ref 0.70–1.11)
GFR, Est African American: 72 mL/min (ref >=60)
GFR, Est Non African American: 62 mL/min (ref >=60)
Glucose, Bld: 74 mg/dL (ref 70–99)
Potassium: 4.4 mmol/L (ref 3.5–5.3)
Sodium: 137 mmol/L (ref 135–146)

## 2017-02-11 LAB — CBC WITH DIFFERENTIAL/PLATELET
BASOS ABS: 0 {cells}/uL (ref 0–200)
Basophils Relative: 0 %
EOS ABS: 321 {cells}/uL (ref 15–500)
Eosinophils Relative: 3 %
HCT: 47.5 % (ref 38.5–50.0)
HEMOGLOBIN: 16 g/dL (ref 13.0–17.0)
Lymphocytes Relative: 13 %
Lymphs Abs: 1391 cells/uL (ref 850–3900)
MCH: 32.9 pg (ref 27.0–33.0)
MCHC: 33.7 g/dL (ref 32.0–36.0)
MCV: 97.5 fL (ref 80.0–100.0)
MONOS PCT: 16 %
MPV: 10.5 fL (ref 7.5–12.5)
Monocytes Absolute: 1712 cells/uL — ABNORMAL HIGH (ref 200–950)
NEUTROS ABS: 7276 {cells}/uL (ref 1500–7800)
NEUTROS PCT: 68 %
PLATELETS: 191 10*3/uL (ref 140–400)
RBC: 4.87 MIL/uL (ref 4.20–5.80)
RDW: 13.8 % (ref 11.0–15.0)
WBC: 10.7 10*3/uL (ref 3.8–10.8)

## 2017-02-11 MED ORDER — DOXYCYCLINE HYCLATE 100 MG PO TABS: 100.0000 mg | ORAL_TABLET | Freq: Two times a day (BID) | ORAL | 0 refills | Status: DC

## 2017-02-11 MED ORDER — PREDNISONE 20 MG PO TABS: 20.0000 mg | ORAL_TABLET | Freq: Every day | ORAL | 0 refills | Status: DC

## 2017-02-11 NOTE — Progress Notes (Signed)
Subjective:    Patient ID: Bruce Travis, male    DOB: March 01, 1935, 82 y.o.   MRN: [id number]  HPI  01/27/17 Over the last 2 days, the patient has had a worsening cough productive of blood-tinged sputum. He has a history of throat cancer and saw his ENT physician yesterday where examination revealed no recurrence of the cancer or explanation for the blood-tinged sputum. He does report worsening cough and increasing shortness of breath over the last few days. Has a history of chronic respiratory failure and oxygen dependency due to COPD. Patient exacerbated some and decompensates rapidly usually requiring hospitalization. Today overall he appears at his baseline. His exam is at his baseline.  At that time, my plan was: Given his past history, I will obtain a chest x-ray to evaluate for any obvious malignancy. Cover the patient for possible upper respiratory infection given his hemoptysis and shortness of breath with doxycycline 100 mg by mouth twice a day for 10 days and a prednisone taper pack to cover for COPD exacerbation. Temporarily discontinue aspirin. Recheck next week. If hemoptysis persists, proceed with a chest CT and possible bronchoscopy if worsening. If resolving spontaneously and chest x-ray is clear no further workup will be necessary at that time  02/04/17 Chest x-ray suggested left hilar infiltrate and left upper lobe infiltrate. Patient was treated with antibiotics for CAP as well as prednisone for community-acquired pneumonia as well as COPD exacerbation. The hemoptysis gradually improved and has resolved over the last week. Patient's breathing is back to his baseline.  At that time, my plan was: Hemoptysis did linger for approximately 1 week and is just now improving. Given his previous history of malignancy, his long smoking history, and his lack of any true symptoms of pneumonia, I will obtain a CT scan of the chest to rule out lung cancer. Pain for the patient is gradually starting to  improve  02/11/17 2 days ago, the patient shortness of breath became worse. He continues to have wheezing and shortness of breath. He denies hemoptysis. He does report some mild substernal discomfort. Past medical history is significant for systolic heart failure with an ejection fraction of 25% last year. After starting Entresto, ejection fraction had improved to 50-55% in August. He denies any orthopnea. He denies any pitting edema. His weight is stable. There is no evidence of fluid overload on exam. Catheterization last year revealed only a 25% blockage in the LAD but no significant coronary lesion. He has not been evaluated for anemia in quite some time. However his exam today appears to demonstrate a COPD exacerbation as he has diminished breath sounds, exp wheezing, and  accessory muscle use Past Medical History:  Diagnosis Date  . Allergic rhinitis   . Bronchiectasis   . CAD (coronary artery disease)   . Celiac disease   . Chronic heart failure ([place])   . Chronic kidney disease (CKD), stage III (moderate)   . COPD (chronic obstructive pulmonary disease) ([place])   . Diverticulosis of colon (without mention of hemorrhage)   . Esophageal reflux   . Family history of adverse reaction to anesthesia    daughter gets PONV  . Family hx of colon cancer   . History of stomach ulcers 1980s  . Hyperlipemia   . Hypertension   . 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 Myocardial infarction [facility])    "previous MI/echo in 12/2015"  . Nephrolithiasis    "got them now; never had  OR/scopes" (02/03/2016)  . On home oxygen therapy    "2L usually; 3L last 3-4 days; 24/7" (08/06/2016)  . Other diseases of lung, not elsewhere classified   . Personal history of colonic polyps 04/26/2007   hyperplastic   . Pneumonia "several times"   Past Surgical History:  Procedure Laterality Date  . CARDIAC CATHETERIZATION  02/03/2016  . CARDIAC CATHETERIZATION  09/30/2009   non-obstructive  CAD w/30% narrowing in prox LAD (Dr. Waunita Schooner)  . CARDIAC CATHETERIZATION  05/04/2001   same at 2011 cath (Dr. Waunita Schooner)  . CARDIAC CATHETERIZATION N/A 02/03/2016   Procedure: Right/Left Heart Cath and Coronary Angiography;  Surgeon: Pixie Casino, MD;  Location: [place] [facility];  Service: Cardiovascular;  Laterality: N/A;  . EPIGLOTOPLASTY W/ MLB     removal due to carcinoma  . EXCISIONAL HEMORRHOIDECTOMY  2000s  . HAND SURGERY Right 1958   d/t crush injury  . TONSILLECTOMY AND ADENOIDECTOMY    . ULTRASOUND GUIDANCE FOR VASCULAR ACCESS  02/03/2016   Procedure: Ultrasound Guidance For Vascular Access;  Surgeon: Pixie Casino, MD;  Location: [place] [facility];  Service: Cardiovascular;;  . VASECTOMY     Current Outpatient Prescriptions on File Prior to Visit  Medication Sig Dispense Refill  . albuterol (PROAIR HFA) 108 (90 Base) MCG/ACT inhaler INHALE 2 PUFFS EVERY 4 HOURS AS NEEDED FOR SHORTNESS OF BREATH. 2 Inhaler 5  . AMBULATORY NON FORMULARY MEDICATION O2 2 Lpm continuous    . amiodarone (PACERONE) 200 MG tablet Take 0.5 tablets (100 mg total) by mouth daily. 30 tablet 6  . aspirin EC 81 MG tablet Take 81 mg by mouth daily.    Marland Kitchen azelastine (ASTELIN) 0.1 % nasal spray PLACE 2 SPRAYS INTO EACH NOSTRILS TWICE DAILY AS DIRECTED. 30 mL 0  . Calcium Carbonate-Vitamin D (CALTRATE 600+D) 600-400 MG-UNIT tablet Take 1 tablet by mouth daily.    Marland Kitchen doxycycline (VIBRA-TABS) 100 MG tablet Take 1 tablet (100 mg total) by mouth 2 (two) times daily. 20 tablet 0  . ENTRESTO 49-51 MG TAKE (1) TABLET BY MOUTH TWICE DAILY. 60 tablet 6  . montelukast (SINGULAIR) 10 MG tablet TAKE 1 TABLET BY MOUTH DAILY. 90 tablet 0  . Multiple Vitamin (MULTIVITAMIN WITH MINERALS) TABS tablet Take 1 tablet by mouth daily.    . pantoprazole (PROTONIX) 40 MG tablet TAKE ONE TABLET BY MOUTH DAILY. 90 tablet 3  . polyethylene glycol (MIRALAX / GLYCOLAX) packet Take 17 g by mouth daily as needed for mild constipation.      Marland Kitchen SPIRIVA HANDIHALER 18 MCG inhalation capsule INHALE 1 CAPSULE ONCE DAILY AS DIRECTED 30 capsule 11  . spironolactone (ALDACTONE) 25 MG tablet Take 0.5 tablets (12.5 mg total) by mouth daily. 16 tablet 6  . SYMBICORT 160-4.5 MCG/ACT inhaler INHALE 2 PUFFS FIRST THING IN MORNING AND THEN ANOTHER 2 PUFFS ABOUT 12 HOURS LATER. 10.2 g 11   No current facility-administered medications on file prior to visit.    Allergies  Allergen Reactions  . Adhesive [Tape] Other (See Comments)    Band-aids - tears skin off  . Codeine Other (See Comments)    Thought head was going to blow off  . Gluten Meal Other (See Comments)    Celiac disease   Social History   Social History  . Marital status: Married    Spouse name: N/A  . Number of children: 3  . Years of education: N/A   Occupational History  . retired Retired  farmer and truck driver   Social History Main Topics  . Smoking status: Former Smoker    Packs/day: 2.00    Years: 35.00    Types: Cigarettes    Quit date: 01/21/1989  . Smokeless tobacco: Former Systems developer    Types: Chew    Quit date: 08/23/2001  . Alcohol use No  . Drug use: No  . Sexual activity: Not on file   Other Topics Concern  . Not on file   Social History Narrative  . No narrative on file      Review of Systems  All other systems reviewed and are negative.      Objective:   Physical Exam  HENT:  Right Ear: Tympanic membrane and ear canal normal.  Left Ear: Tympanic membrane and ear canal normal.  Nose: No mucosal edema or rhinorrhea.  Cardiovascular: Normal rate, regular rhythm and normal heart sounds.   No murmur heard. Pulmonary/Chest: Accessory muscle usage present. Tachypnea noted. He has decreased breath sounds. He has wheezes. He has no rhonchi. He has no rales. He exhibits no tenderness.  Vitals reviewed.    EKG shows normal sinus rhythm with a right bundle branch block, Q waves in lead 2, 3, aVF, and nonspecific ST segment changes/depression  in V1, V2, and V3. All these are chronic changes are present on a previous EKG in March     Assessment & Plan:  Given the fact that he has increased sputum production with white sputum, increasing shortness of breath, and increasing wheezing with increased cough, I believe he is developing COPD exacerbation likely due to the fact that I weaned him off the prednisone too quickly. Begin prednisone 40 mg a day. Recheck the patient in one week and slowly wean him off the prednisone. Even the increased sputum production and change in consistency, I'll also add doxycycline 100 mg by mouth twice a day for 10 days. Reassess the patient in one week or sooner if worse. I will also check a CBC to rule out anemia. I see no evidence of congestive heart failure today on his exam

## 2017-02-21 ENCOUNTER — Inpatient Hospital Stay (HOSPITAL_COMMUNITY)
Admission: EM | Admit: 2017-02-21 | Discharge: 2017-02-26 | DRG: 189 | Disposition: A | Discharge status: Home / Self Care | Payer: Medicare Other | Attending: Internal Medicine | Admitting: Internal Medicine

## 2017-02-21 ENCOUNTER — Encounter: Payer: Self-pay | Admitting: Family Medicine

## 2017-02-21 ENCOUNTER — Emergency Department (HOSPITAL_COMMUNITY): Payer: Medicare Other

## 2017-02-21 ENCOUNTER — Ambulatory Visit (INDEPENDENT_AMBULATORY_CARE_PROVIDER_SITE_OTHER): Payer: Medicare Other | Admitting: Family Medicine

## 2017-02-21 ENCOUNTER — Encounter (HOSPITAL_COMMUNITY): Payer: Self-pay

## 2017-02-21 VITALS — BP 142/62 | HR 98 | Temp 97.8°F | Resp 24 | Ht 66.5 in | Wt 126.0 lb

## 2017-02-21 DIAGNOSIS — I10 Essential (primary) hypertension: Secondary | ICD-10-CM | POA: Diagnosis not present

## 2017-02-21 DIAGNOSIS — J9601 Acute respiratory failure with hypoxia: Secondary | ICD-10-CM | POA: Diagnosis not present

## 2017-02-21 DIAGNOSIS — Z87891 Personal history of nicotine dependence: Secondary | ICD-10-CM

## 2017-02-21 DIAGNOSIS — J9621 Acute and chronic respiratory failure with hypoxia: Secondary | ICD-10-CM | POA: Diagnosis not present

## 2017-02-21 DIAGNOSIS — N179 Acute kidney failure, unspecified: Secondary | ICD-10-CM | POA: Diagnosis present

## 2017-02-21 DIAGNOSIS — J441 Chronic obstructive pulmonary disease with (acute) exacerbation: Secondary | ICD-10-CM | POA: Diagnosis present

## 2017-02-21 DIAGNOSIS — N183 Chronic kidney disease, stage 3 unspecified: Secondary | ICD-10-CM | POA: Diagnosis present

## 2017-02-21 DIAGNOSIS — J9811 Atelectasis: Secondary | ICD-10-CM | POA: Diagnosis present

## 2017-02-21 DIAGNOSIS — Z6820 Body mass index (BMI) 20.0-20.9, adult: Secondary | ICD-10-CM

## 2017-02-21 DIAGNOSIS — I252 Old myocardial infarction: Secondary | ICD-10-CM

## 2017-02-21 DIAGNOSIS — R64 Cachexia: Secondary | ICD-10-CM | POA: Diagnosis present

## 2017-02-21 DIAGNOSIS — Z7982 Long term (current) use of aspirin: Secondary | ICD-10-CM | POA: Diagnosis not present

## 2017-02-21 DIAGNOSIS — E43 Unspecified severe protein-calorie malnutrition: Secondary | ICD-10-CM | POA: Diagnosis present

## 2017-02-21 DIAGNOSIS — N170 Acute kidney failure with tubular necrosis: Secondary | ICD-10-CM | POA: Diagnosis not present

## 2017-02-21 DIAGNOSIS — I2510 Atherosclerotic heart disease of native coronary artery without angina pectoris: Secondary | ICD-10-CM | POA: Insufficient documentation

## 2017-02-21 DIAGNOSIS — Z9289 Personal history of other medical treatment: Secondary | ICD-10-CM

## 2017-02-21 DIAGNOSIS — E785 Hyperlipidemia, unspecified: Secondary | ICD-10-CM | POA: Diagnosis present

## 2017-02-21 DIAGNOSIS — Z885 Allergy status to narcotic agent status: Secondary | ICD-10-CM

## 2017-02-21 DIAGNOSIS — K900 Celiac disease: Secondary | ICD-10-CM | POA: Diagnosis present

## 2017-02-21 DIAGNOSIS — I130 Hypertensive heart and chronic kidney disease with heart failure and stage 1 through stage 4 chronic kidney disease, or unspecified chronic kidney disease: Secondary | ICD-10-CM | POA: Diagnosis present

## 2017-02-21 DIAGNOSIS — J471 Bronchiectasis with (acute) exacerbation: Secondary | ICD-10-CM | POA: Diagnosis present

## 2017-02-21 DIAGNOSIS — J9600 Acute respiratory failure, unspecified whether with hypoxia or hypercapnia: Secondary | ICD-10-CM

## 2017-02-21 DIAGNOSIS — Z7952 Long term (current) use of systemic steroids: Secondary | ICD-10-CM

## 2017-02-21 DIAGNOSIS — R0603 Acute respiratory distress: Secondary | ICD-10-CM

## 2017-02-21 DIAGNOSIS — I272 Pulmonary hypertension, unspecified: Secondary | ICD-10-CM | POA: Diagnosis present

## 2017-02-21 DIAGNOSIS — K219 Gastro-esophageal reflux disease without esophagitis: Secondary | ICD-10-CM | POA: Diagnosis present

## 2017-02-21 DIAGNOSIS — I5022 Chronic systolic (congestive) heart failure: Secondary | ICD-10-CM | POA: Diagnosis present

## 2017-02-21 DIAGNOSIS — Z7951 Long term (current) use of inhaled steroids: Secondary | ICD-10-CM

## 2017-02-21 DIAGNOSIS — Z9981 Dependence on supplemental oxygen: Secondary | ICD-10-CM | POA: Diagnosis not present

## 2017-02-21 DIAGNOSIS — Z8521 Personal history of malignant neoplasm of larynx: Secondary | ICD-10-CM | POA: Diagnosis not present

## 2017-02-21 DIAGNOSIS — I255 Ischemic cardiomyopathy: Secondary | ICD-10-CM | POA: Diagnosis present

## 2017-02-21 DIAGNOSIS — I493 Ventricular premature depolarization: Secondary | ICD-10-CM | POA: Diagnosis present

## 2017-02-21 DIAGNOSIS — I509 Heart failure, unspecified: Secondary | ICD-10-CM

## 2017-02-21 DIAGNOSIS — D7589 Other specified diseases of blood and blood-forming organs: Secondary | ICD-10-CM | POA: Diagnosis present

## 2017-02-21 DIAGNOSIS — Z66 Do not resuscitate: Secondary | ICD-10-CM | POA: Diagnosis present

## 2017-02-21 DIAGNOSIS — J9620 Acute and chronic respiratory failure, unspecified whether with hypoxia or hypercapnia: Secondary | ICD-10-CM | POA: Diagnosis present

## 2017-02-21 DIAGNOSIS — Z91048 Other nonmedicinal substance allergy status: Secondary | ICD-10-CM

## 2017-02-21 DIAGNOSIS — R0602 Shortness of breath: Secondary | ICD-10-CM | POA: Diagnosis present

## 2017-02-21 LAB — BASIC METABOLIC PANEL
Anion gap: 10 (ref 5–15)
BUN: 21 mg/dL — AB (ref 6–20)
CALCIUM: 9.7 mg/dL (ref 8.9–10.3)
CO2: 25 mmol/L (ref 22–32)
Chloride: 106 mmol/L (ref 101–111)
Creatinine, Ser: 1.32 mg/dL — ABNORMAL HIGH (ref 0.61–1.24)
GFR calc Af Amer: 57 mL/min — ABNORMAL LOW (ref >60)
GFR, EST NON AFRICAN AMERICAN: 49 mL/min — AB (ref >60)
GLUCOSE: 100 mg/dL — AB (ref 65–99)
Potassium: 4.4 mmol/L (ref 3.5–5.1)
SODIUM: 141 mmol/L (ref 135–145)

## 2017-02-21 LAB — MRSA PCR SCREENING: MRSA BY PCR: NEGATIVE

## 2017-02-21 LAB — CBC
HEMATOCRIT: 50.1 % (ref 39.0–52.0)
Hemoglobin: 16.7 g/dL (ref 13.0–17.0)
MCH: 33.7 pg (ref 26.0–34.0)
MCHC: 33.3 g/dL (ref 30.0–36.0)
MCV: 101 fL — ABNORMAL HIGH (ref 78.0–100.0)
PLATELETS: 269 10*3/uL (ref 150–400)
RBC: 4.96 MIL/uL (ref 4.22–5.81)
RDW: 14.9 % (ref 11.5–15.5)
WBC: 15.5 10*3/uL — AB (ref 4.0–10.5)

## 2017-02-21 LAB — I-STAT TROPONIN, ED: TROPONIN I, POC: 0.01 ng/mL (ref 0.00–0.08)

## 2017-02-21 LAB — BRAIN NATRIURETIC PEPTIDE
B Natriuretic Peptide: 74.8 pg/mL (ref 0.0–100.0)
B Natriuretic Peptide: 77 pg/mL (ref 0.0–100.0)

## 2017-02-21 MED ORDER — METHYLPREDNISOLONE ACETATE 80 MG/ML IJ SUSP
80.0000 mg | Freq: Once | INTRAMUSCULAR | Status: AC
  Administered 2017-02-21: 80 mg via INTRAMUSCULAR

## 2017-02-21 MED ORDER — BENZONATATE 100 MG PO CAPS
100.0000 mg | ORAL_CAPSULE | Freq: Two times a day (BID) | ORAL | Status: DC | PRN
  Administered 2017-02-21: 100 mg via ORAL
  Filled 2017-02-21: qty 1

## 2017-02-21 MED ORDER — AZELASTINE HCL 0.1 % NA SOLN
1.0000 | Freq: Two times a day (BID) | NASAL | Status: DC
  Administered 2017-02-22 – 2017-02-26 (×4): 1 via NASAL
  Filled 2017-02-21: qty 30

## 2017-02-21 MED ORDER — MONTELUKAST SODIUM 10 MG PO TABS
10.0000 mg | ORAL_TABLET | Freq: Every day | ORAL | Status: DC
  Administered 2017-02-22 – 2017-02-26 (×5): 10 mg via ORAL
  Filled 2017-02-21 (×5): qty 1

## 2017-02-21 MED ORDER — ACETAMINOPHEN 325 MG PO TABS: 650.0000 mg | ORAL_TABLET | Freq: Four times a day (QID) | ORAL | Status: DC | PRN

## 2017-02-21 MED ORDER — MOMETASONE FURO-FORMOTEROL FUM 200-5 MCG/ACT IN AERO
2.0000 | INHALATION_SPRAY | Freq: Two times a day (BID) | RESPIRATORY_TRACT | Status: DC
  Administered 2017-02-22 – 2017-02-23 (×3): 2 via RESPIRATORY_TRACT
  Filled 2017-02-21 (×2): qty 8.8

## 2017-02-21 MED ORDER — ALBUTEROL (5 MG/ML) CONTINUOUS INHALATION SOLN
10.0000 mg/h | INHALATION_SOLUTION | Freq: Once | RESPIRATORY_TRACT | Status: AC
  Administered 2017-02-21: 10 mg/h via RESPIRATORY_TRACT
  Filled 2017-02-21: qty 20

## 2017-02-21 MED ORDER — SODIUM CHLORIDE 0.9% FLUSH
3.0000 mL | Freq: Two times a day (BID) | INTRAVENOUS | Status: DC
  Administered 2017-02-21 – 2017-02-25 (×4): 3 mL via INTRAVENOUS

## 2017-02-21 MED ORDER — METHYLPREDNISOLONE SODIUM SUCC 125 MG IJ SOLR
125.0000 mg | Freq: Once | INTRAMUSCULAR | Status: AC
  Administered 2017-02-21: 125 mg via INTRAVENOUS
  Filled 2017-02-21: qty 2

## 2017-02-21 MED ORDER — SPIRONOLACTONE 12.5 MG HALF TABLET
12.5000 mg | ORAL_TABLET | Freq: Every day | ORAL | Status: DC
  Administered 2017-02-22: 12.5 mg via ORAL
  Filled 2017-02-21 (×2): qty 1

## 2017-02-21 MED ORDER — IPRATROPIUM BROMIDE 0.02 % IN SOLN
0.5000 mg | Freq: Once | RESPIRATORY_TRACT | Status: AC
  Administered 2017-02-21: 0.5 mg via RESPIRATORY_TRACT
  Filled 2017-02-21: qty 2.5

## 2017-02-21 MED ORDER — PANTOPRAZOLE SODIUM 40 MG PO TBEC
40.0000 mg | DELAYED_RELEASE_TABLET | Freq: Every day | ORAL | Status: DC
  Administered 2017-02-21 – 2017-02-26 (×6): 40 mg via ORAL
  Filled 2017-02-21 (×6): qty 1

## 2017-02-21 MED ORDER — ADULT MULTIVITAMIN W/MINERALS CH
1.0000 | ORAL_TABLET | Freq: Every day | ORAL | Status: DC
  Administered 2017-02-22 – 2017-02-26 (×5): 1 via ORAL
  Filled 2017-02-21 (×5): qty 1

## 2017-02-21 MED ORDER — ALBUTEROL SULFATE (2.5 MG/3ML) 0.083% IN NEBU
2.5000 mg | INHALATION_SOLUTION | RESPIRATORY_TRACT | Status: AC
  Administered 2017-02-21 – 2017-02-22 (×4): 2.5 mg via RESPIRATORY_TRACT
  Filled 2017-02-21 (×4): qty 3

## 2017-02-21 MED ORDER — ACETAMINOPHEN 650 MG RE SUPP: 650.0000 mg | Freq: Four times a day (QID) | RECTAL | Status: DC | PRN

## 2017-02-21 MED ORDER — ASPIRIN EC 81 MG PO TBEC
81.0000 mg | DELAYED_RELEASE_TABLET | Freq: Every day | ORAL | Status: DC
  Administered 2017-02-22 – 2017-02-26 (×5): 81 mg via ORAL
  Filled 2017-02-21 (×5): qty 1

## 2017-02-21 MED ORDER — ONDANSETRON HCL 4 MG PO TABS: 4.0000 mg | ORAL_TABLET | Freq: Four times a day (QID) | ORAL | Status: DC | PRN

## 2017-02-21 MED ORDER — POLYETHYLENE GLYCOL 3350 17 G PO PACK: 17.0000 g | PACK | Freq: Every day | ORAL | Status: DC | PRN

## 2017-02-21 MED ORDER — SACUBITRIL-VALSARTAN 49-51 MG PO TABS
1.0000 | ORAL_TABLET | Freq: Two times a day (BID) | ORAL | Status: DC
  Administered 2017-02-21 – 2017-02-22 (×3): 1 via ORAL
  Filled 2017-02-21 (×4): qty 1

## 2017-02-21 MED ORDER — CALCIUM CARBONATE-VITAMIN D 500-200 MG-UNIT PO TABS
1.0000 | ORAL_TABLET | Freq: Every day | ORAL | Status: DC
  Administered 2017-02-22 – 2017-02-26 (×5): 1 via ORAL
  Filled 2017-02-21 (×5): qty 1

## 2017-02-21 MED ORDER — SODIUM CHLORIDE 0.9 % IV SOLN
INTRAVENOUS | Status: AC
  Administered 2017-02-21: 17:00:00 via INTRAVENOUS

## 2017-02-21 MED ORDER — AMIODARONE HCL 200 MG PO TABS
100.0000 mg | ORAL_TABLET | Freq: Every day | ORAL | Status: DC
  Administered 2017-02-22 – 2017-02-26 (×5): 100 mg via ORAL
  Filled 2017-02-21 (×5): qty 1

## 2017-02-21 MED ORDER — ONDANSETRON HCL 4 MG/2ML IJ SOLN: 4.0000 mg | Freq: Four times a day (QID) | INTRAMUSCULAR | Status: DC | PRN

## 2017-02-21 MED ORDER — METHYLPREDNISOLONE SODIUM SUCC 125 MG IJ SOLR
60.0000 mg | Freq: Four times a day (QID) | INTRAMUSCULAR | Status: DC
  Administered 2017-02-21 – 2017-02-24 (×10): 60 mg via INTRAVENOUS
  Filled 2017-02-21 (×11): qty 2

## 2017-02-21 MED ORDER — ENOXAPARIN SODIUM 40 MG/0.4ML ~~LOC~~ SOLN
40.0000 mg | SUBCUTANEOUS | Status: DC
  Administered 2017-02-21: 40 mg via SUBCUTANEOUS
  Filled 2017-02-21: qty 0.4

## 2017-02-21 NOTE — H&P (Signed)
History and Physical    Bruce Travis DXA:[id number] DOB: 1934/09/16 DOA: 02/21/2017  PCP: Susy Frizzle, MD Patient coming from: home  Chief Complaint: sob HPI: Bruce Travis is a delightful  82 y.o. male with medical history significant for chronic respiratory failure on 3 L nasal cannula at home, COPD, CHF, hypertension, chronic kidney disease, GERD, celiac disease, CAD presents to the emergency department from his primary care provider's office with chief complaint acute on chronic respiratory failure with hypoxia likely related to COPD exacerbation.  Information is obtained from the patient and his daughter who is at the bedside. Since states he has been seeing his primary care provider for the last 6 weeks for "chest congestion and blood-tinged sputum". He reports back in early June he experienced some worsening shortness of breath and cough with subjective fevers. He also experienced intermittent blood-tinged sputum. He was seen by his primary care provider and placed on steroids and treated for pneumonia. He states he finished his antibiotics and is in the process of steroid taper. He states he had time periods where his shortness of breath and cough improved and then worsened again presumably from 2 quick of a steroid taper. Last week he boarded airplane went to [place]. Daughter states he came off with "head cold". Last 2 days he reports worsening shortness of breath increased sputum production. He also reports increased oxygen requirements. He usually wears nasal cannula 3 L. He reports he also has a history of throat cancer and saw his ENT yesterday and examination revealed no recurrence of the cancer. He denies chest pain palpitations headache dizziness syncope or near-syncope. He denies fever chills abdominal pain nausea vomiting diarrhea constipation. He denies dysuria hematuria frequency or urgency. He denies any lower extremity edema or orthopnea. He went to his primary care provider's office  today and received IM steroids and breathing treatments. Did not improve and was referred to the emergency department.    ED Course: In the emergency department he's afebrile, tachycardic hemodynamically stable increased oxygen demand. Oxygen saturation level dropped 87% with ambulation on echo oxygen. In the emergency department he received nebs oxygen 50% Ventimask. At the time of admission respiratory effort improved oxygen saturation level 94% on Ventimask  Review of Systems: As per HPI otherwise all other systems reviewed and are negative.   Ambulatory Status: Ambulates independently.  Past Medical History:  Diagnosis Date  . Allergic rhinitis   . Bronchiectasis   . CAD (coronary artery disease)   . Celiac disease   . Chronic heart failure ([place])   . Chronic kidney disease (CKD), stage III (moderate)   . COPD (chronic obstructive pulmonary disease) ([place])   . Diverticulosis of colon (without mention of hemorrhage)   . Esophageal reflux   . Family history of adverse reaction to anesthesia    daughter gets PONV  . Family hx of colon cancer   . History of stomach ulcers 1980s  . Hyperlipemia   . Hypertension   . 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 Myocardial infarction [facility])    "previous MI/echo in 12/2015"  . Nephrolithiasis    "got them now; never had OR/scopes" (02/03/2016)  . On home oxygen therapy    "2L usually; 3L last 3-4 days; 24/7" (08/06/2016)  . Other diseases of lung, not elsewhere classified   . Personal history of colonic polyps 04/26/2007   hyperplastic   . Pneumonia "several times"    Past Surgical History:  Procedure Laterality Date  . CARDIAC CATHETERIZATION  02/03/2016  . CARDIAC CATHETERIZATION  09/30/2009   non-obstructive CAD w/30% narrowing in prox LAD (Dr. Waunita Schooner)  . CARDIAC CATHETERIZATION  05/04/2001   same at 2011 cath (Dr. Waunita Schooner)  . CARDIAC CATHETERIZATION N/A 02/03/2016   Procedure: Right/Left Heart Cath and  Coronary Angiography;  Surgeon: Pixie Casino, MD;  Location: [place] [facility];  Service: Cardiovascular;  Laterality: N/A;  . EPIGLOTOPLASTY W/ MLB     removal due to carcinoma  . EXCISIONAL HEMORRHOIDECTOMY  2000s  . HAND SURGERY Right 1958   d/t crush injury  . TONSILLECTOMY AND ADENOIDECTOMY    . ULTRASOUND GUIDANCE FOR VASCULAR ACCESS  02/03/2016   Procedure: Ultrasound Guidance For Vascular Access;  Surgeon: Pixie Casino, MD;  Location: [place] [facility];  Service: Cardiovascular;;  . VASECTOMY      Social History   Social History  . Marital status: Married    Spouse name: N/A  . Number of children: 3  . Years of education: N/A   Occupational History  . retired Retired    Psychologist, sport and exercise and Administrator   Social History Main Topics  . Smoking status: Former Smoker    Packs/day: 2.00    Years: 35.00    Types: Cigarettes    Quit date: 01/21/1989  . Smokeless tobacco: Former Systems developer    Types: Chew    Quit date: 08/23/2001  . Alcohol use No  . Drug use: No  . Sexual activity: Not on file   Other Topics Concern  . Not on file   Social History Narrative  . No narrative on file    Allergies  Allergen Reactions  . Adhesive [Tape] Other (See Comments)    Band-aids - tears skin off  . Codeine Other (See Comments)    Thought head was going to blow off  . Gluten Meal Other (See Comments)    Celiac disease    Family History  Problem Relation Age of Onset  . Heart disease Father   . Colon cancer Father   . Prostate cancer Father   . Stroke Mother   . Endometrial cancer Sister   . Stroke Maternal Grandmother   . Cancer Paternal Grandmother     Prior to Admission medications   Medication Sig Start Date End Date Taking? Authorizing Provider  albuterol (PROAIR HFA) 108 (90 Base) MCG/ACT inhaler INHALE 2 PUFFS EVERY 4 HOURS AS NEEDED FOR SHORTNESS OF BREATH. 11/10/16   Susy Frizzle, MD  AMBULATORY NON FORMULARY MEDICATION O2 2 Lpm continuous    [provider]  amiodarone (PACERONE) 200 MG tablet Take 0.5 tablets (100 mg total) by mouth daily. 06/30/16   Bensimhon, Shaune Pascal, MD  aspirin EC 81 MG tablet Take 81 mg by mouth daily.    [provider]  azelastine (ASTELIN) 0.1 % nasal spray PLACE 2 SPRAYS INTO EACH NOSTRILS TWICE DAILY AS DIRECTED. 07/01/16   Susy Frizzle, MD  Calcium Carbonate-Vitamin D (CALTRATE 600+D) 600-400 MG-UNIT tablet Take 1 tablet by mouth daily.    [provider]  doxycycline (VIBRA-TABS) 100 MG tablet Take 1 tablet (100 mg total) by mouth 2 (two) times daily. 01/27/17   Susy Frizzle, MD  ENTRESTO 49-51 MG TAKE (1) TABLET BY MOUTH TWICE DAILY. 10/08/16   Bensimhon, Shaune Pascal, MD  montelukast (SINGULAIR) 10 MG tablet TAKE 1 TABLET BY MOUTH DAILY. 02/01/17   Susy Frizzle, MD  Multiple Vitamin (MULTIVITAMIN  WITH MINERALS) TABS tablet Take 1 tablet by mouth daily.    [provider]  pantoprazole (PROTONIX) 40 MG tablet TAKE ONE TABLET BY MOUTH DAILY. 11/26/16   Susy Frizzle, MD  polyethylene glycol (MIRALAX / Floria Raveling) packet Take 17 g by mouth daily as needed for mild constipation.     [provider]  predniSONE (DELTASONE) 20 MG tablet Take 1 tablet (20 mg total) by mouth daily with breakfast. 02/11/17   Susy Frizzle, MD  SPIRIVA HANDIHALER 18 MCG inhalation capsule INHALE 1 CAPSULE ONCE DAILY AS DIRECTED 03/16/16   Susy Frizzle, MD  spironolactone (ALDACTONE) 25 MG tablet Take 0.5 tablets (12.5 mg total) by mouth daily. 09/22/16   Shirley Friar, PA-C  SYMBICORT 160-4.5 MCG/ACT inhaler INHALE 2 PUFFS FIRST THING IN MORNING AND THEN ANOTHER 2 PUFFS ABOUT 12 HOURS LATER. 01/12/17   Susy Frizzle, MD    Physical Exam: Vitals:   02/21/17 1258 02/21/17 1315 02/21/17 1345 02/21/17 1415  BP:  133/71 128/61 (!) 153/52  Pulse:  92 (!) 41 (!) 48  Resp:  (!) 21 18 (!) 21  Temp:      TempSrc:      SpO2: 93% 92% 92% (!) 89%  Weight:      Height:          General:  Appears Somewhat thin and frail, only slightly anxious, not uncomfortable Eyes:  PERRL, EOMI, normal lids, iris ENT:  grossly normal hearing, lips & tongue, mucous membranes of her mouth are pink somewhat dry Neck:  no LAD, masses or thyromegaly Cardiovascular:  Tachycardic heart sounds distant, no m/r/g. No LE edema.  Respiratory:  Mild increased work of breathing with conversation. Using abdominal accessory muscles. Breath sounds are quite diminished throughout. I hear faint distant coarseness with maybe some end expiratory wheezing on the right. No crackles Abdomen:  soft, ntnd, positive bowel sounds no guarding or rebounding Skin:  no rash or induration seen on limited exam Musculoskeletal:  grossly normal tone BUE/BLE, good ROM, no bony abnormality Psychiatric:  grossly normal mood and affect, speech fluent and appropriate, AOx3 Neurologic:  CN 2-12 grossly intact, moves all extremities in coordinated fashion, sensation intact. Speech clear facial symmetry oriented 3 moving all extremities  Labs on Admission: I have personally reviewed following labs and imaging studies  CBC:  Recent Labs Lab 02/21/17 1200  WBC 15.5*  HGB 16.7  HCT 50.1  MCV 101.0*  PLT 202   Basic Metabolic Panel:  Recent Labs Lab 02/21/17 1200  NA 141  K 4.4  CL 106  CO2 25  GLUCOSE 100*  BUN 21*  CREATININE 1.32*  CALCIUM 9.7   GFR: Estimated Creatinine Clearance: 36.1 mL/min (A) (by C-G formula based on SCr of 1.32 mg/dL (H)). Liver Function Tests: No results for input(s): AST, ALT, ALKPHOS, BILITOT, PROT, ALBUMIN in the last 168 hours. No results for input(s): LIPASE, AMYLASE in the last 168 hours. No results for input(s): AMMONIA in the last 168 hours. Coagulation Profile: No results for input(s): INR, PROTIME in the last 168 hours. Cardiac Enzymes: No results for input(s): CKTOTAL, CKMB, CKMBINDEX, TROPONINI in the last 168 hours. BNP (last 3 results) No results for  input(s): PROBNP in the last 8760 hours. HbA1C: No results for input(s): HGBA1C in the last 72 hours. CBG: No results for input(s): GLUCAP in the last 168 hours. Lipid Profile: No results for input(s): CHOL, HDL, LDLCALC, TRIG, CHOLHDL, LDLDIRECT in the last 72 hours. Thyroid Function  Tests: No results for input(s): TSH, T4TOTAL, FREET4, T3FREE, THYROIDAB in the last 72 hours. Anemia Panel: No results for input(s): VITAMINB12, FOLATE, FERRITIN, TIBC, IRON, RETICCTPCT in the last 72 hours. Urine analysis:    Component Value Date/Time   COLORURINE YELLOW 01/10/2012 1730   APPEARANCEUR CLEAR 01/10/2012 1730   LABSPEC 1.016 01/10/2012 1730   PHURINE 5.5 01/10/2012 1730   GLUCOSEU NEGATIVE 01/10/2012 1730   GLUCOSEU NEGATIVE 09/01/2006 1033   HGBUR TRACE (A) 01/10/2012 1730   BILIRUBINUR NEGATIVE 01/10/2012 1730   KETONESUR NEGATIVE 01/10/2012 1730   PROTEINUR NEGATIVE 01/10/2012 1730   UROBILINOGEN 0.2 01/10/2012 1730   NITRITE NEGATIVE 01/10/2012 1730   LEUKOCYTESUR NEGATIVE 01/10/2012 1730    Creatinine Clearance: Estimated Creatinine Clearance: 36.1 mL/min (A) (by C-G formula based on SCr of 1.32 mg/dL (H)).  Sepsis Labs: @LABRCNTIP (procalcitonin:4,lacticidven:4) )No results found for this or any previous visit (from the past 240 hour(s)).   Radiological Exams on Admission: Dg Chest Port 1 View  Result Date: 02/21/2017 CLINICAL DATA:  Pneumonia EXAM: PORTABLE CHEST 1 VIEW COMPARISON:  01/27/2017 FINDINGS: The cardiac silhouette, mediastinal and hilar contours are stable. Stable tortuosity and calcification of the thoracic aorta. Low lung volumes with vascular crowding and bibasilar atelectasis. Severe chronic underlying lung disease with emphysema and pulmonary fibrosis. Stable basilar scarring changes. No definite infiltrates or effusions. Stable calcified granuloma at the right lung base. The bony structures are intact. IMPRESSION: Severe chronic lung disease. Low lung volumes  with vascular crowding and bibasilar atelectasis. Stable bibasilar scarring changes. No definite infiltrates or effusions. Electronically Signed   By: Marijo Sanes M.D.   On: 02/21/2017 12:30    EKG: Independently reviewed. Sinus rhythm with occasional and consecutive Premature ventricular complexes Right bundle branch block Abnormal ECG  Assessment/Plan Principal Problem:   Acute on chronic respiratory failure (HCC) Active Problems:   GERD   Celiac disease   Hypertension   COPD with acute exacerbation (HCC)   Cardiomyopathy, ischemic   Heart failure (HCC)   Chronic kidney disease (CKD), stage III (moderate)   #1. Acute on chronic respiratory failure likely related to COPD exacerbation. Chest x-ray with severe lung disease. No infiltrates. Over last 6 weeks patient has been treated for COPD exacerbation 2 and community-acquired pneumonia. This treatment included steroids antibiotics and nebulizer treatments. He reports he finished the antibiotics yesterday and steroids are being tapered. Last week he got on airplane to [place] when he got home he had nasal congestion that he says "moved my chest". Oxygen saturation level 87% on 3 L is a cannula which is what he wears at home. He is provided with nebulizers steroids and sent to the emergency department. On admission his respiratory effort is much improved oxygen saturation level greater than 90% on 50% oxygen via Ventimask -Admit to step down for now -Continue oxygen supplementation -Wean oxygen to 3 L as able -Continue scheduled meds -Continue Solu-Medrol -anti-tussive -flutter valve -monitor  #2. COPD with acute exacerbation. See #1. Improving on admission. Chest x-ray as noted above Patient reports he had a pulmonologist in the past but transferred back to primary care provider per pulmonology. He is on home oxygen 3 L 24 hours a day. Home medications include albuterol inhaler, Spiriva, Symbicort. Chart review indicates patient has been  anticipating in pulmonary rehabilitation. Obtain sets to greater than 88%. He is not on chronic prednisone as of yet. -Scheduled nebulizers -Solu-Medrol -Flutter valve -Wean oxygen to 3 L as able -Monitor  #3. Chronic systolic heart failure.  Chest x-ray as noted above. BNP within the limits of normal. Does not appear overloaded. Home medications include amiodarone,Entresto Spironolactone. Echo in August 2017 with an EF of 55% -Daily weights -Intake and output -Continue home meds -monitor  #4. chronic kidney disease stage III. Creatinine 1.32 on admission. It appears this is just above baseline. -Monitor urine output -Continue home meds -hold neprhotoxin -bemet in am  5. Hypertension. Fair control in the emergency department.  -Continue home meds  6. GERD. Stable at baseline.    DVT prophylaxis: scd Code Status: dnr  Family Communication: daughter at bedside  Disposition Plan: home  Consults called: none  Admission status: inpatient    Radene Gunning MD [facility]  If 7PM-7AM, please contact night-coverage [email or web address] Password [email or web address]  02/21/2017, 3:12 PM

## 2017-02-21 NOTE — Progress Notes (Signed)
Subjective:    Patient ID: Bruce Travis, male    DOB: 1935-06-14, 82 y.o.   MRN: [id number]  HPI  01/27/17 Over the last 2 days, the patient has had a worsening cough productive of blood-tinged sputum. He has a history of throat cancer and saw his ENT physician yesterday where examination revealed no recurrence of the cancer or explanation for the blood-tinged sputum. He does report worsening cough and increasing shortness of breath over the last few days. Has a history of chronic respiratory failure and oxygen dependency due to COPD. Patient exacerbated some and decompensates rapidly usually requiring hospitalization. Today overall he appears at his baseline. His exam is at his baseline.  At that time, my plan was: Given his past history, I will obtain a chest x-ray to evaluate for any obvious malignancy. Cover the patient for possible upper respiratory infection given his hemoptysis and shortness of breath with doxycycline 100 mg by mouth twice a day for 10 days and a prednisone taper pack to cover for COPD exacerbation. Temporarily discontinue aspirin. Recheck next week. If hemoptysis persists, proceed with a chest CT and possible bronchoscopy if worsening. If resolving spontaneously and chest x-ray is clear no further workup will be necessary at that time  02/04/17 Chest x-ray suggested left hilar infiltrate and left upper lobe infiltrate. Patient was treated with antibiotics for CAP as well as prednisone for community-acquired pneumonia as well as COPD exacerbation. The hemoptysis gradually improved and has resolved over the last week. Patient's breathing is back to his baseline.  At that time, my plan was: Hemoptysis did linger for approximately 1 week and is just now improving. Given his previous history of malignancy, his long smoking history, and his lack of any true symptoms of pneumonia, I will obtain a CT scan of the chest to rule out lung cancer. Pain for the patient is gradually starting to  improve  02/11/17 2 days ago, the patient shortness of breath became worse. He continues to have wheezing and shortness of breath. He denies hemoptysis. He does report some mild substernal discomfort. Past medical history is significant for systolic heart failure with an ejection fraction of 25% last year. After starting Entresto, ejection fraction had improved to 50-55% in August. He denies any orthopnea. He denies any pitting edema. His weight is stable. There is no evidence of fluid overload on exam. Catheterization last year revealed only a 25% blockage in the LAD but no significant coronary lesion. He has not been evaluated for anemia in quite some time. However his exam today appears to demonstrate a COPD exacerbation as he has diminished breath sounds, exp wheezing, and accessory muscle use.  At that time, my plan was: Given the fact that he has increased sputum production with white sputum, increasing shortness of breath, and increasing wheezing with increased cough, I believe he is developing COPD exacerbation likely due to the fact that I weaned him off the prednisone too quickly. Begin prednisone 40 mg a day. Recheck the patient in one week and slowly wean him off the prednisone. Given the increased sputum production and change in consistency, I'll also add doxycycline 100 mg by mouth twice a day for 10 days. Reassess the patient in one week or sooner if worse. I will also check a CBC to rule out anemia. I see no evidence of congestive heart failure today on his exam  02/21/17 Patient states that he is not doing any better. Actually, he is doing worse. After walking into  my office, he was saturating 87% on oxygen. It improved only minimally after increasing his oxygen to 5 L. On exam, he has markedly diminished breath sounds bilaterally. There are faint scattered expiratory wheezes but very little in the way of any breath sounds. He has tachypnea and accessory muscle use.  He has prolonged expiratory  phase he is mildly tachycardic on exam.  He reports dyspnea on exertion with very little activity.  That has now increased to dyspnea at rest.    Past Medical History:  Diagnosis Date  . Allergic rhinitis   . Bronchiectasis   . CAD (coronary artery disease)   . Celiac disease   . Chronic heart failure ([place])   . Chronic kidney disease (CKD), stage III (moderate)   . COPD (chronic obstructive pulmonary disease) ([place])   . Diverticulosis of colon (without mention of hemorrhage)   . Esophageal reflux   . Family history of adverse reaction to anesthesia    daughter gets PONV  . Family hx of colon cancer   . History of stomach ulcers 1980s  . Hyperlipemia   . Hypertension   . 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 Myocardial infarction [facility])    "previous MI/echo in 12/2015"  . Nephrolithiasis    "got them now; never had OR/scopes" (02/03/2016)  . On home oxygen therapy    "2L usually; 3L last 3-4 days; 24/7" (08/06/2016)  . Other diseases of lung, not elsewhere classified   . Personal history of colonic polyps 04/26/2007   hyperplastic   . Pneumonia "several times"   Past Surgical History:  Procedure Laterality Date  . CARDIAC CATHETERIZATION  02/03/2016  . CARDIAC CATHETERIZATION  09/30/2009   non-obstructive CAD w/30% narrowing in prox LAD (Dr. Waunita Schooner)  . CARDIAC CATHETERIZATION  05/04/2001   same at 2011 cath (Dr. Waunita Schooner)  . CARDIAC CATHETERIZATION N/A 02/03/2016   Procedure: Right/Left Heart Cath and Coronary Angiography;  Surgeon: Pixie Casino, MD;  Location: [place] [facility];  Service: Cardiovascular;  Laterality: N/A;  . EPIGLOTOPLASTY W/ MLB     removal due to carcinoma  . EXCISIONAL HEMORRHOIDECTOMY  2000s  . HAND SURGERY Right 1958   d/t crush injury  . TONSILLECTOMY AND ADENOIDECTOMY    . ULTRASOUND GUIDANCE FOR VASCULAR ACCESS  02/03/2016   Procedure: Ultrasound Guidance For Vascular Access;  Surgeon: Pixie Casino, MD;  Location:  [place] [facility];  Service: Cardiovascular;;  . VASECTOMY     Current Outpatient Prescriptions on File Prior to Visit  Medication Sig Dispense Refill  . albuterol (PROAIR HFA) 108 (90 Base) MCG/ACT inhaler INHALE 2 PUFFS EVERY 4 HOURS AS NEEDED FOR SHORTNESS OF BREATH. 2 Inhaler 5  . AMBULATORY NON FORMULARY MEDICATION O2 2 Lpm continuous    . amiodarone (PACERONE) 200 MG tablet Take 0.5 tablets (100 mg total) by mouth daily. 30 tablet 6  . aspirin EC 81 MG tablet Take 81 mg by mouth daily.    Marland Kitchen azelastine (ASTELIN) 0.1 % nasal spray PLACE 2 SPRAYS INTO EACH NOSTRILS TWICE DAILY AS DIRECTED. 30 mL 0  . Calcium Carbonate-Vitamin D (CALTRATE 600+D) 600-400 MG-UNIT tablet Take 1 tablet by mouth daily.    Marland Kitchen doxycycline (VIBRA-TABS) 100 MG tablet Take 1 tablet (100 mg total) by mouth 2 (two) times daily. 20 tablet 0  . doxycycline (VIBRA-TABS) 100 MG tablet Take 1 tablet (100 mg total) by mouth 2 (two) times daily. 20 tablet 0  .  ENTRESTO 49-51 MG TAKE (1) TABLET BY MOUTH TWICE DAILY. 60 tablet 6  . montelukast (SINGULAIR) 10 MG tablet TAKE 1 TABLET BY MOUTH DAILY. 90 tablet 0  . Multiple Vitamin (MULTIVITAMIN WITH MINERALS) TABS tablet Take 1 tablet by mouth daily.    . pantoprazole (PROTONIX) 40 MG tablet TAKE ONE TABLET BY MOUTH DAILY. 90 tablet 3  . polyethylene glycol (MIRALAX / GLYCOLAX) packet Take 17 g by mouth daily as needed for mild constipation.     . predniSONE (DELTASONE) 20 MG tablet Take 1 tablet (20 mg total) by mouth daily with breakfast. 30 tablet 0  . SPIRIVA HANDIHALER 18 MCG inhalation capsule INHALE 1 CAPSULE ONCE DAILY AS DIRECTED 30 capsule 11  . spironolactone (ALDACTONE) 25 MG tablet Take 0.5 tablets (12.5 mg total) by mouth daily. 16 tablet 6  . SYMBICORT 160-4.5 MCG/ACT inhaler INHALE 2 PUFFS FIRST THING IN MORNING AND THEN ANOTHER 2 PUFFS ABOUT 12 HOURS LATER. 10.2 g 11   No current facility-administered medications on file prior to visit.    Allergies  Allergen  Reactions  . Adhesive [Tape] Other (See Comments)    Band-aids - tears skin off  . Codeine Other (See Comments)    Thought head was going to blow off  . Gluten Meal Other (See Comments)    Celiac disease   Social History   Social History  . Marital status: Married    Spouse name: N/A  . Number of children: 3  . Years of education: N/A   Occupational History  . retired Retired    Psychologist, sport and exercise and Administrator   Social History Main Topics  . Smoking status: Former Smoker    Packs/day: 2.00    Years: 35.00    Types: Cigarettes    Quit date: 01/21/1989  . Smokeless tobacco: Former Systems developer    Types: Chew    Quit date: 08/23/2001  . Alcohol use No  . Drug use: No  . Sexual activity: Not on file   Other Topics Concern  . Not on file   Social History Narrative  . No narrative on file      Review of Systems  All other systems reviewed and are negative.      Objective:   Physical Exam  HENT:  Right Ear: Tympanic membrane and ear canal normal.  Left Ear: Tympanic membrane and ear canal normal.  Nose: No mucosal edema or rhinorrhea.  Cardiovascular: Normal rate, regular rhythm and normal heart sounds.   No murmur heard. Pulmonary/Chest: Accessory muscle usage present. Tachypnea noted. He has decreased breath sounds. He has wheezes. He has no rhonchi. He has no rales. He exhibits no tenderness.  Vitals reviewed.        Assessment & Plan:  COPD exacerbation ([place])  Acute respiratory failure with hypoxia ([place])  Despite the fact the patient's been on prednisone 20 mg a day for the last 7 days in addition to doxycycline, he is hypoxic on his normal supplemental oxygen. (was supposed to be on 40 mg a day but has been taking one pill of the 20 mg a day) Patient was given a DuoNeb 1 in the office at 10:15. He was also given 80 mg of Depo-Medrol IM 1 at 10:15.  We will reassess the patient in 10 minutes to see his response.   There are 2 issues. First issue is the acute  hypoxia/respiratory failure which I believe is due to a COPD exacerbation. He appears to need high-dose steroids, frequent nebulizer  treatments, and antibiotics to treat the acute issue. The question is whether this can be accomplished at home or does he require hospitalization.   The second issue is following up his recent hemoptysis and pneumonia with a CT scan that has been scheduled for this Friday.  That will have to be on hold until the first issue has been addressed.  I also question if some of the dyspnea may be related to amiodarone.  He is on spiriva, symbicort, and prednisone 20 mg a day and doing worse.     10:38 Still hypoxic at 89% on 5 L O2 after receiving duoneb and depomedrol.  Patient appears to have COPD exacerbation. I believe he needs high-dose steroids, nebulizer treatments every 2-4 hours, and antibiotic coverage until improving. Given his advanced age, his cardiovascular complications, I do not believe he can tolerate outpatient treatment and is failing outpatient treatment. Recommend ER evaluation.  If patient fails to respond to treatment for COPD exacerbation and imaging of the chest shows no other obvious source, would consider amiodarone as a possible cause of his dyspnea.

## 2017-02-21 NOTE — ED Notes (Signed)
Pt adjusted in bed, SpO2 decreased to 80% on 5L [place]. Kal, RT at bedside and placing pt on venti mask.

## 2017-02-21 NOTE — ED Triage Notes (Signed)
Per Pt, Pt is coming from PCP where he was seen and has been treated for pneumonia x 2 weeks. Pt reports being unable to get oxygen saturations above 85% on 5 L. Pt has white frothy sputum with cough and reports SOB.

## 2017-02-21 NOTE — ED Provider Notes (Signed)
[facility] DEPT Provider Note   CSN: [id number] Arrival date & time: 02/21/17  1108     History   Chief Complaint Chief Complaint  Patient presents with  . Shortness of Breath    HPI Bruce Travis is a 82 y.o. male.  HPI Patient presents to the emergency room for evaluation of shortness of breath. Patient has been having trouble with cough, congestion over the last several weeks.  Patient was initially seen back on May 17 at his doctor's office for cough and congestion. His symptoms were felt to be related to a COPD exacerbation.  Patient was seen back in the office on June 7.   Patient was started on doxycycline and prednisone taper. He seemed to be improving when he went back to the office on June 15.  Patient went back to the office today because he was having increasing shortness of breath. In the office patient's oxygen saturation was noted to be 87% while on his home oxygen regimen. The patient was given a breathing treatment and steroids in the office. He remained hypoxic. Patient's doctor felt he needed be admitted to the hospital. He was sent to the emergency room for further evaluation.  No hx of PE or DVT.  No leg swelling Past Medical History:  Diagnosis Date  . Allergic rhinitis   . Bronchiectasis   . CAD (coronary artery disease)   . Celiac disease   . Chronic heart failure ([place])   . Chronic kidney disease (CKD), stage III (moderate)   . COPD (chronic obstructive pulmonary disease) ([place])   . Diverticulosis of colon (without mention of hemorrhage)   . Esophageal reflux   . Family history of adverse reaction to anesthesia    daughter gets PONV  . Family hx of colon cancer   . History of stomach ulcers 1980s  . Hyperlipemia   . Hypertension   . 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 Myocardial infarction [facility])    "previous MI/echo in 12/2015"  . Nephrolithiasis    "got them now; never had OR/scopes" (02/03/2016)  . On home oxygen  therapy    "2L usually; 3L last 3-4 days; 24/7" (08/06/2016)  . Other diseases of lung, not elsewhere classified   . Personal history of colonic polyps 04/26/2007   hyperplastic   . Pneumonia "several times"    Patient Active Problem List   Diagnosis Date Noted  . Community acquired pneumonia of left lower lobe of lung ([place]) 08/06/2016  . Heart failure ([place]) 02/03/2016  . Cardiomyopathy, ischemic 01/06/2016  . History of acute inferior wall MI 01/06/2016  . PVC's (premature ventricular contractions) 12/12/2015  . Insomnia 04/16/2015  . Nocturnal hypoxemia 03/06/2013  . COPD mixed type ([place]) 06/22/2012  . Allergic rhinitis 01/10/2012  . Hypertension 01/10/2012  . Dysphagia 04/13/2011  . Hoarseness 04/02/2011  . Celiac disease 12/15/2010  . SINUSITIS, CHRONIC 09/10/2009  . ARTHUS PHENOMENON 10/05/2007  . PULMONARY NODULE 08/11/2007  . Chronic respiratory failure ([place]) 07/06/2007  . GERD 07/06/2007    Past Surgical History:  Procedure Laterality Date  . CARDIAC CATHETERIZATION  02/03/2016  . CARDIAC CATHETERIZATION  09/30/2009   non-obstructive CAD w/30% narrowing in prox LAD (Dr. Waunita Schooner)  . CARDIAC CATHETERIZATION  05/04/2001   same at 2011 cath (Dr. Waunita Schooner)  . CARDIAC CATHETERIZATION N/A 02/03/2016   Procedure: Right/Left Heart Cath and Coronary Angiography;  Surgeon: Pixie Casino, MD;  Location: [place] [facility];  Service:  Cardiovascular;  Laterality: N/A;  . EPIGLOTOPLASTY W/ MLB     removal due to carcinoma  . EXCISIONAL HEMORRHOIDECTOMY  2000s  . HAND SURGERY Right 1958   d/t crush injury  . TONSILLECTOMY AND ADENOIDECTOMY    . ULTRASOUND GUIDANCE FOR VASCULAR ACCESS  02/03/2016   Procedure: Ultrasound Guidance For Vascular Access;  Surgeon: Pixie Casino, MD;  Location: [place] [facility];  Service: Cardiovascular;;  . VASECTOMY         Home Medications    Prior to Admission medications   Medication Sig Start Date End Date Taking? Authorizing  Provider  albuterol (PROAIR HFA) 108 (90 Base) MCG/ACT inhaler INHALE 2 PUFFS EVERY 4 HOURS AS NEEDED FOR SHORTNESS OF BREATH. 11/10/16   Susy Frizzle, MD  AMBULATORY NON FORMULARY MEDICATION O2 2 Lpm continuous    [provider]  amiodarone (PACERONE) 200 MG tablet Take 0.5 tablets (100 mg total) by mouth daily. 06/30/16   Bensimhon, Shaune Pascal, MD  aspirin EC 81 MG tablet Take 81 mg by mouth daily.    [provider]  azelastine (ASTELIN) 0.1 % nasal spray PLACE 2 SPRAYS INTO EACH NOSTRILS TWICE DAILY AS DIRECTED. 07/01/16   Susy Frizzle, MD  Calcium Carbonate-Vitamin D (CALTRATE 600+D) 600-400 MG-UNIT tablet Take 1 tablet by mouth daily.    [provider]  doxycycline (VIBRA-TABS) 100 MG tablet Take 1 tablet (100 mg total) by mouth 2 (two) times daily. 01/27/17   Susy Frizzle, MD  ENTRESTO 49-51 MG TAKE (1) TABLET BY MOUTH TWICE DAILY. 10/08/16   Bensimhon, Shaune Pascal, MD  montelukast (SINGULAIR) 10 MG tablet TAKE 1 TABLET BY MOUTH DAILY. 02/01/17   Susy Frizzle, MD  Multiple Vitamin (MULTIVITAMIN WITH MINERALS) TABS tablet Take 1 tablet by mouth daily.    [provider]  pantoprazole (PROTONIX) 40 MG tablet TAKE ONE TABLET BY MOUTH DAILY. 11/26/16   Susy Frizzle, MD  polyethylene glycol (MIRALAX / Floria Raveling) packet Take 17 g by mouth daily as needed for mild constipation.     [provider]  predniSONE (DELTASONE) 20 MG tablet Take 1 tablet (20 mg total) by mouth daily with breakfast. 02/11/17   Susy Frizzle, MD  SPIRIVA HANDIHALER 18 MCG inhalation capsule INHALE 1 CAPSULE ONCE DAILY AS DIRECTED 03/16/16   Susy Frizzle, MD  spironolactone (ALDACTONE) 25 MG tablet Take 0.5 tablets (12.5 mg total) by mouth daily. 09/22/16   Shirley Friar, PA-C  SYMBICORT 160-4.5 MCG/ACT inhaler INHALE 2 PUFFS FIRST THING IN MORNING AND THEN ANOTHER 2 PUFFS ABOUT 12 HOURS LATER. 01/12/17   Susy Frizzle, MD    Family History Family  History  Problem Relation Age of Onset  . Heart disease Father   . Colon cancer Father   . Prostate cancer Father   . Stroke Mother   . Endometrial cancer Sister   . Stroke Maternal Grandmother   . Cancer Paternal Grandmother     Social History Social History  Substance Use Topics  . Smoking status: Former Smoker    Packs/day: 2.00    Years: 35.00    Types: Cigarettes    Quit date: 01/21/1989  . Smokeless tobacco: Former Systems developer    Types: Chew    Quit date: 08/23/2001  . Alcohol use No     Allergies   Adhesive [tape]; Codeine; and Gluten meal   Review of Systems Review of Systems  All other systems reviewed and are negative.  Physical Exam Updated Vital Signs BP (!) 153/52   Pulse (!) 48   Temp 98.6 F (37 C) (Oral)   Resp (!) 21   Ht 1.689 m (5' 6.5")   Wt 57.2 kg (126 lb)   SpO2 (!) 89%   BMI 20.03 kg/m   Physical Exam  Constitutional: No distress.  HENT:  Head: Normocephalic and atraumatic.  Right Ear: External ear normal.  Left Ear: External ear normal.  Eyes: Conjunctivae are normal. Right eye exhibits no discharge. Left eye exhibits no discharge. No scleral icterus.  Neck: Neck supple. No tracheal deviation present.  Cardiovascular: Normal rate, regular rhythm and intact distal pulses.   Pulmonary/Chest: Effort normal. No stridor. Tachypnea noted. No respiratory distress. He has decreased breath sounds. He has no wheezes. He has no rales.  ? Few wheezes right lower lobe  Abdominal: Soft. Bowel sounds are normal. He exhibits no distension. There is no tenderness. There is no rebound and no guarding.  Musculoskeletal: He exhibits no edema or tenderness.  Neurological: He is alert. He has normal strength. No cranial nerve deficit (no facial droop, extraocular movements intact, no slurred speech) or sensory deficit. He exhibits normal muscle tone. He displays no seizure activity. Coordination normal.  Skin: Skin is warm and dry. No rash noted. He is not  diaphoretic.  Psychiatric: He has a normal mood and affect.  Nursing note and vitals reviewed.    ED Treatments / Results  Labs (all labs ordered are listed, but only abnormal results are displayed) Labs Reviewed  BASIC METABOLIC PANEL - Abnormal; Notable for the following:       Result Value   Glucose, Bld 100 (*)    BUN 21 (*)    Creatinine, Ser 1.32 (*)    GFR calc non Af Amer 49 (*)    GFR calc Af Amer 57 (*)    All other components within normal limits  CBC - Abnormal; Notable for the following:    WBC 15.5 (*)    MCV 101.0 (*)    All other components within normal limits  BRAIN NATRIURETIC PEPTIDE  I-STAT TROPOININ, ED    EKG  EKG Interpretation  Date/Time:  Monday February 21 2017 11:45:50 EDT Ventricular Rate:  95 PR Interval:  142 QRS Duration: 138 QT Interval:  402 QTC Calculation: 505 R Axis:   -95 Text Interpretation:  Sinus rhythm with occasional and consecutive Premature ventricular complexes Right bundle branch block Abnormal ECG Confirmed by Dorie Rank [phone]) on 02/21/2017 12:16:38 PM       Radiology Dg Chest Port 1 View  Result Date: 02/21/2017 CLINICAL DATA:  Pneumonia EXAM: PORTABLE CHEST 1 VIEW COMPARISON:  01/27/2017 FINDINGS: The cardiac silhouette, mediastinal and hilar contours are stable. Stable tortuosity and calcification of the thoracic aorta. Low lung volumes with vascular crowding and bibasilar atelectasis. Severe chronic underlying lung disease with emphysema and pulmonary fibrosis. Stable basilar scarring changes. No definite infiltrates or effusions. Stable calcified granuloma at the right lung base. The bony structures are intact. IMPRESSION: Severe chronic lung disease. Low lung volumes with vascular crowding and bibasilar atelectasis. Stable bibasilar scarring changes. No definite infiltrates or effusions. Electronically Signed   By: Marijo Sanes M.D.   On: 02/21/2017 12:30    Procedures Procedures (including critical care  time)  Medications Ordered in ED Medications  albuterol (PROVENTIL,VENTOLIN) solution continuous neb (10 mg/hr Nebulization Given 02/21/17 1252)  ipratropium (ATROVENT) nebulizer solution 0.5 mg (0.5 mg Nebulization Given 02/21/17 1241)  methylPREDNISolone sodium  succinate (SOLU-MEDROL) 125 mg/2 mL injection 125 mg (125 mg Intravenous Given 02/21/17 1241)     Initial Impression / Assessment and Plan / ED Course  I have reviewed the triage vital signs and the nursing notes.  Pertinent labs & imaging results that were available during my care of the patient were reviewed by me and considered in my medical decision making (see chart for details).   patient presented to the emergency room with persistent hypoxia associated with COPD exacerbation.  No definite pneumonia on x-ray.  The patient was treated with steroids and albuterol Atrovent treatments. Persistent symptoms for several weeks and continues to have hypoxia. I will consult with the medical service for admission  Final Clinical Impressions(s) / ED Diagnoses   Final diagnoses:  COPD exacerbation ([place])       Dorie Rank, MD 02/21/17 [phone]

## 2017-02-21 NOTE — Addendum Note (Signed)
Addended by: Shary Decamp B on: 02/21/2017 10:49 AM   Modules accepted: Orders

## 2017-02-22 ENCOUNTER — Inpatient Hospital Stay (HOSPITAL_COMMUNITY): Payer: Medicare Other

## 2017-02-22 ENCOUNTER — Encounter (HOSPITAL_COMMUNITY): Admission: RE | Admit: 2017-02-22 | Payer: Self-pay | Source: Ambulatory Visit

## 2017-02-22 ENCOUNTER — Other Ambulatory Visit: Payer: Medicare Other

## 2017-02-22 DIAGNOSIS — I5022 Chronic systolic (congestive) heart failure: Secondary | ICD-10-CM

## 2017-02-22 DIAGNOSIS — N170 Acute kidney failure with tubular necrosis: Secondary | ICD-10-CM

## 2017-02-22 LAB — BASIC METABOLIC PANEL
ANION GAP: 10 (ref 5–15)
BUN: 29 mg/dL — ABNORMAL HIGH (ref 6–20)
CALCIUM: 9.3 mg/dL (ref 8.9–10.3)
CO2: 24 mmol/L (ref 22–32)
Chloride: 105 mmol/L (ref 101–111)
Creatinine, Ser: 1.6 mg/dL — ABNORMAL HIGH (ref 0.61–1.24)
GFR calc non Af Amer: 39 mL/min — ABNORMAL LOW (ref >60)
GFR, EST AFRICAN AMERICAN: 45 mL/min — AB (ref >60)
Glucose, Bld: 136 mg/dL — ABNORMAL HIGH (ref 65–99)
Potassium: 4.5 mmol/L (ref 3.5–5.1)
SODIUM: 139 mmol/L (ref 135–145)

## 2017-02-22 LAB — CBC
HCT: 42.9 % (ref 39.0–52.0)
HEMOGLOBIN: 14.5 g/dL (ref 13.0–17.0)
MCH: 35 pg — AB (ref 26.0–34.0)
MCHC: 33.8 g/dL (ref 30.0–36.0)
MCV: 103.6 fL — ABNORMAL HIGH (ref 78.0–100.0)
Platelets: 218 10*3/uL (ref 150–400)
RBC: 4.14 MIL/uL — ABNORMAL LOW (ref 4.22–5.81)
RDW: 15.1 % (ref 11.5–15.5)
WBC: 12.4 10*3/uL — ABNORMAL HIGH (ref 4.0–10.5)

## 2017-02-22 MED ORDER — SODIUM CHLORIDE 0.9 % IV SOLN
INTRAVENOUS | Status: DC
  Administered 2017-02-22 – 2017-02-24 (×4): via INTRAVENOUS

## 2017-02-22 MED ORDER — IPRATROPIUM-ALBUTEROL 0.5-2.5 (3) MG/3ML IN SOLN
3.0000 mL | Freq: Three times a day (TID) | RESPIRATORY_TRACT | Status: DC
  Administered 2017-02-22 – 2017-02-26 (×11): 3 mL via RESPIRATORY_TRACT
  Filled 2017-02-22 (×12): qty 3

## 2017-02-22 MED ORDER — DM-GUAIFENESIN ER 30-600 MG PO TB12
1.0000 | ORAL_TABLET | Freq: Two times a day (BID) | ORAL | Status: DC
  Administered 2017-02-22 – 2017-02-26 (×8): 1 via ORAL
  Filled 2017-02-22 (×8): qty 1

## 2017-02-22 MED ORDER — ENOXAPARIN SODIUM 30 MG/0.3ML ~~LOC~~ SOLN
30.0000 mg | SUBCUTANEOUS | Status: DC
  Administered 2017-02-22 – 2017-02-23 (×2): 30 mg via SUBCUTANEOUS
  Filled 2017-02-22 (×2): qty 0.3

## 2017-02-22 MED ORDER — BOOST PLUS PO LIQD
237.0000 mL | Freq: Three times a day (TID) | ORAL | Status: DC
  Administered 2017-02-22 – 2017-02-26 (×8): 237 mL via ORAL
  Filled 2017-02-22 (×16): qty 237

## 2017-02-22 MED ORDER — IPRATROPIUM-ALBUTEROL 0.5-2.5 (3) MG/3ML IN SOLN
3.0000 mL | Freq: Four times a day (QID) | RESPIRATORY_TRACT | Status: DC
  Administered 2017-02-22: 3 mL via RESPIRATORY_TRACT
  Filled 2017-02-22: qty 3

## 2017-02-22 NOTE — Evaluation (Signed)
Occupational Therapy Evaluation Patient Details Name: Bruce Travis MRN: [id number] DOB: 07-Jul-1935 Today's Date: 02/22/2017    History of Present Illness 82 yo male with onset of acute respiratory failure was on 3L O2 at home, had lung scarring and bloody sputum over the last week.  Hypoxic, exacerbated COPD, increased O2 need.  PMHx:  COPD, CHF, HTN, CKD 3, GERD, celiac disease, CAD, EF 55%   Clinical Impression   PT admitted with hypoxic with exacerbation of COPD. Pt currently with functional limitiations due to the deficits listed below (see OT problem list). Pt currently requires 10 L of O2 which limits all adls. Pt educated on grab bar by toilet due to pull on towel rack and declines 3n1. Pt currenlty has wife (A) but DOE 4/4 with movement.  Pt will benefit from skilled OT to increase their independence and safety with adls and balance to allow discharge home.     Follow Up Recommendations  No OT follow up    Equipment Recommendations  None recommended by OT (declines 3n1 states "no i dont need it")    Recommendations for Other Services       Precautions / Restrictions Precautions Precautions: Fall Precaution Comments: watch 02 Restrictions Weight Bearing Restrictions: No      Mobility Bed Mobility Overal bed mobility: Needs Assistance Bed Mobility: Supine to Sit     Supine to sit: Min assist     General bed mobility comments: in chair ona rrival  Transfers Overall transfer level: Needs assistance Equipment used: Rolling walker (2 wheeled);1 person hand held assist Transfers: Sit to/from Stand Sit to Stand: Min guard         General transfer comment: pt is using lowest setting on bed    Balance Overall balance assessment: Needs assistance Sitting-balance support: Feet supported Sitting balance-Leahy Scale: Fair     Standing balance support: Single extremity supported Standing balance-Leahy Scale: Poor                             ADL either  performed or assessed with clinical judgement   ADL Overall ADL's : Needs assistance/impaired Eating/Feeding: Set up   Grooming: Set up;Sitting Grooming Details (indicate cue type and reason): requires sitting with breaks Upper Body Bathing: Minimal assistance   Lower Body Bathing: Minimal assistance Lower Body Bathing Details (indicate cue type and reason): educated on crossing vs bending for EC techniques         Toilet Transfer: Min guard             General ADL Comments: pt requires to void in urinal due to SOB with mobility. pt required Min (A) to static stand     Vision Baseline Vision/History: No visual deficits       Perception     Praxis      Pertinent Vitals/Pain Pain Assessment: No/denies pain     Hand Dominance Right   Extremity/Trunk Assessment Upper Extremity Assessment Upper Extremity Assessment: Overall WFL for tasks assessed   Lower Extremity Assessment Lower Extremity Assessment: Defer to PT evaluation   Cervical / Trunk Assessment Cervical / Trunk Assessment: Kyphotic   Communication Communication Communication: No difficulties   Cognition Arousal/Alertness: Awake/alert Behavior During Therapy: WFL for tasks assessed/performed Overall Cognitive Status: Within Functional Limits for tasks assessed  General Comments       Exercises Exercises: Other exercises (ankles are 0 deg DF, hips in mild flexion contractures) Other Exercises Other Exercises: oxygen remained 92% or better on 10 L Other Exercises: session focused on [facility] handout and techniques. Educauted iwth wife present Other Exercises: reports fatigue after 1 hr in chair   Shoulder Instructions      Home Living Family/patient expects to be discharged to:: Private residence Living Arrangements: Spouse/significant other Available Help at Discharge: Family;Available PRN/intermittently Type of Home: House Home Access: Stairs to  enter [employer] of Steps: 2 Entrance Stairs-Rails: None Home Layout: One level     Bathroom Shower/Tub: Occupational psychologist: Standard     Home Equipment: Kasandra Knudsen - single point   Additional Comments: wife of 60 years present and very helpful. pt does the dishes and she cooks. She reports he is riding anything he can in the yard to be outside ( specifically the mower)      Prior Functioning/Environment Level of Independence: Independent with assistive device(s)        Comments: used 2LO2        OT Problem List: Decreased strength;Decreased activity tolerance;Impaired balance (sitting and/or standing);Decreased safety awareness;Decreased knowledge of use of DME or AE;Decreased knowledge of precautions      OT Treatment/Interventions: Self-care/ADL training;Therapeutic exercise;Energy conservation;DME and/or AE instruction;Therapeutic activities;Balance training;Patient/family education    OT Goals(Current goals can be found in the care plan section) Acute Rehab OT Goals Patient Stated Goal: to get stronger OT Goal Formulation: With patient Time For Goal Achievement: 03/08/17 Potential to Achieve Goals: Good  OT Frequency: Min 2X/week   Barriers to D/C:            Co-evaluation              AM-PAC PT "6 Clicks" Daily Activity     Outcome Measure Help from another person eating meals?: None Help from another person taking care of personal grooming?: None Help from another person toileting, which includes using toliet, bedpan, or urinal?: A Little Help from another person bathing (including washing, rinsing, drying)?: A Little Help from another person to put on and taking off regular upper body clothing?: A Little Help from another person to put on and taking off regular lower body clothing?: A Little 6 Click Score: 20   End of Session Equipment Utilized During Treatment: Oxygen Nurse Communication: Mobility status;Precautions  Activity  Tolerance: Patient tolerated treatment well Patient left: in chair;with call bell/phone within reach;with family/visitor present  OT Visit Diagnosis: Unsteadiness on feet (R26.81)                Time: 8563-1497 OT Time Calculation (min): 16 min Charges:  OT General Charges $OT Visit: 1 Procedure OT Evaluation $OT Eval Moderate Complexity: 1 Procedure G-Codes:      Jeri Modena   OTR/L Pager: [phone] Office: [phone] .   Parke Poisson B 02/22/2017, 3:39 PM

## 2017-02-22 NOTE — Evaluation (Signed)
Physical Therapy Evaluation Patient Details Name: Bruce Travis MRN: [id number] DOB: Oct 30, 1934 Today's Date: 02/22/2017   History of Present Illness  82 yo male with onset of acute respiratory failure was on 3L O2 at home, had lung scarring and bloody sputum over the last week.  Hypoxic, exacerbated COPD, increased O2 need.  PMHx:  COPD, CHF, HTN, CKD 3, GERD, celiac disease, CAD, EF 55%  Clinical Impression  Plan for pt visit is to assess his mobility and he can move fairly well with minor help but quite unsteady looking by end of session.  He will be seen for preparation to go home, plan to have HHPT follow him and may benefit from stair training before leaving to get home.    Follow Up Recommendations Supervision/Assistance - 24 hour;Supervision for mobility/OOB;Home health PT    Equipment Recommendations  Rolling walker with 5" wheels    Recommendations for Other Services       Precautions / Restrictions Precautions Precautions: Fall Restrictions Weight Bearing Restrictions: No      Mobility  Bed Mobility Overal bed mobility: Needs Assistance Bed Mobility: Supine to Sit     Supine to sit: Min assist     General bed mobility comments: light support of trunk and legs  Transfers Overall transfer level: Needs assistance Equipment used: Rolling walker (2 wheeled);1 person hand held assist Transfers: Sit to/from Stand Sit to Stand: Min assist         General transfer comment: pt is using lowest setting on bed  Ambulation/Gait Ambulation/Gait assistance: Min assist Ambulation Distance (Feet): 30 Feet (15 x 2) Assistive device: 1 person hand held assist Gait Pattern/deviations: Step-through pattern;Decreased stride length;Wide base of support;Trunk flexed;Drifts right/left Gait velocity: reduced Gait velocity interpretation: Below normal speed for age/gender General Gait Details: knees are buckling in appearance by end of walk  Stairs            Wheelchair  Mobility    Modified Rankin (Stroke Patients Only)       Balance Overall balance assessment: Needs assistance Sitting-balance support: Feet supported Sitting balance-Leahy Scale: Fair     Standing balance support: Single extremity supported Standing balance-Leahy Scale: Poor                               Pertinent Vitals/Pain Pain Assessment: No/denies pain    Home Living Family/patient expects to be discharged to:: Private residence Living Arrangements: Spouse/significant other Available Help at Discharge: Family;Available PRN/intermittently Type of Home: House Home Access: Stairs to enter Entrance Stairs-Rails: None Entrance Stairs-Number of Steps: 2 Home Layout: One level Home Equipment: Cane - single point Additional Comments: wife is correcting husband's answers    Prior Function Level of Independence: Independent with assistive device(s)         Comments: used 3L O2     Hand Dominance        Extremity/Trunk Assessment   Upper Extremity Assessment Upper Extremity Assessment: Overall WFL for tasks assessed    Lower Extremity Assessment Lower Extremity Assessment: Generalized weakness    Cervical / Trunk Assessment Cervical / Trunk Assessment: Kyphotic  Communication   Communication: No difficulties  Cognition Arousal/Alertness: Awake/alert Behavior During Therapy: WFL for tasks assessed/performed Overall Cognitive Status: Within Functional Limits for tasks assessed  General Comments      Exercises     Assessment/Plan    PT Assessment Patient needs continued PT services  PT Problem List Decreased strength;Decreased range of motion;Decreased activity tolerance;Decreased balance;Decreased mobility;Decreased coordination;Decreased knowledge of use of DME;Cardiopulmonary status limiting activity;Decreased safety awareness       PT Treatment Interventions DME instruction;Gait  training;Stair training;Functional mobility training;Therapeutic activities;Therapeutic exercise;Balance training;Neuromuscular re-education;Patient/family education    PT Goals (Current goals can be found in the Care Plan section)  Acute Rehab PT Goals Patient Stated Goal: to get stronger PT Goal Formulation: With patient/family Time For Goal Achievement: 03/08/17 Potential to Achieve Goals: Good    Frequency Min 3X/week   Barriers to discharge Inaccessible home environment;Decreased caregiver support wife is present but will be able to help limited way, steps no rails to enter house    Co-evaluation               AM-PAC PT "6 Clicks" Daily Activity  Outcome Measure Difficulty turning over in bed (including adjusting bedclothes, sheets and blankets)?: Total Difficulty moving from lying on back to sitting on the side of the bed? : Total Difficulty sitting down on and standing up from a chair with arms (e.g., wheelchair, bedside commode, etc,.)?: Total Help needed moving to and from a bed to chair (including a wheelchair)?: A Little Help needed walking in hospital room?: A Little Help needed climbing 3-5 steps with a railing? : A Lot 6 Click Score: 11    End of Session Equipment Utilized During Treatment: Gait belt;Oxygen Activity Tolerance: Patient tolerated treatment well;Patient limited by fatigue;Other (comment) (O2 sats were 90% or greater) Patient left: in chair;with call bell/phone within reach;with chair alarm set;with family/visitor present Nurse Communication: Mobility status PT Visit Diagnosis: Unsteadiness on feet (R26.81);Muscle weakness (generalized) (M62.81);Other abnormalities of gait and mobility (R26.89)    Time: 9774-1423 PT Time Calculation (min) (ACUTE ONLY): 27 min   Charges:   PT Evaluation $PT Eval Moderate Complexity: 1 Procedure PT Treatments $Gait Training: 8-22 mins   PT G Codes:   PT G-Codes **NOT FOR INPATIENT CLASS** Functional  Assessment Tool Used: AM-PAC 6 Clicks Basic Mobility    Ramond Dial 02/22/2017, 1:36 PM   Mee Hives, PT MS Acute Rehab Dept. Number: [place] [facility] [place]

## 2017-02-22 NOTE — Progress Notes (Signed)
RT placed Pt on nasal canula 6L Pt clear and diminished with Sat of 94% Pt sitting up straight in bed and looks good. RT will continue to monitor.

## 2017-02-22 NOTE — Plan of Care (Signed)
Problem: Education: Goal: Knowledge of [facility] Education information/materials will improve Outcome: Progressing Patient aware of plan of care.  RN provided medication education on all medications administered thus far this shift prior to administration.  Patient stated understanding.  RN instructed patient to call and wait for assistance prior to getting out of bed.  Patient agreeable to RN instruction.

## 2017-02-22 NOTE — Care Management Note (Addendum)
Case Management Note  Patient Details  Name: Bruce Travis MRN: [id number] Date of Birth: Sep 12, 1934  Subjective/Objective:  Pt presented for Acute on Chronic Respiratory Failure. Initiated on High flow 02. Currently at home with 02 via [place] 3-5 Liters. Wife to bring 02 for transport home.  Pt is from home with wife Bruce Travis-PTA not homebound and independent. Pt was in cardiac rehab program. Pt gets medications delivered via [place].                  Action/Plan: No home needs identified at this time.   Expected Discharge Date:                  Expected Discharge Plan:  Home/Self Care  In-House Referral:  NA  Discharge planning Services  CM Consult  Post Acute Care Choice:    Choice offered to:  NA  DME Arranged:  Oxygen- increased liter flow DME Agency: [facility]  HH Arranged:  NA HH Agency:  NA  Status of Service:  Completed, signed off  If discussed at [place] of Stay Meetings, dates discussed:  02-24-17  Additional Comments: 1436 02-25-17 Jacqlyn Krauss, RN,BSN [phone] CM did call [facility] in regards to DME 02 and increased lister flow. New order written for 6 Liters. Pt's concentrator will need to be changed out to a 10 and his portable delivery tank will be switched out as well to compensate the 6 Liters. CM spoke to [place] at [place] to fax information to the office @ [phone]. No further needs from CM at this time.  Bethena Roys, RN 02/22/2017, 4:26 PM

## 2017-02-22 NOTE — Progress Notes (Signed)
PROGRESS NOTE    Bruce Travis  HYI:[id number] DOB: 1935-06-05 DOA: 02/21/2017 PCP: Susy Frizzle, MD   Brief Narrative:  82 y.o. WM PMHx Chronic Respiratory failure on 3 L nasal cannula at home, COPD, Chronic Systolic CHF, Pulmonary HTN, HTN, CAD, MI, CKD  GERD, Celiac disease, Laryngeal cancer    Presents to the emergency department from his primary care provider's office with chief complaint acute on chronic respiratory failure with hypoxia likely related to COPD exacerbation.  Information is obtained from the patient and his daughter who is at the bedside. Since states he has been seeing his primary care provider for the last 6 weeks for "chest congestion and blood-tinged sputum". He reports back in early June he experienced some worsening shortness of breath and cough with subjective fevers. He also experienced intermittent blood-tinged sputum. He was seen by his primary care provider and placed on steroids and treated for pneumonia. He states he finished his antibiotics and is in the process of steroid taper. He states he had time periods where his shortness of breath and cough improved and then worsened again presumably from 2 quick of a steroid taper. Last week he boarded airplane went to [place]. Daughter states he came off with "head cold". Last 2 days he reports worsening shortness of breath increased sputum production. He also reports increased oxygen requirements. He usually wears nasal cannula 3 L. He reports he also has a history of throat cancer and saw his ENT yesterday and examination revealed no recurrence of the cancer. He denies chest pain palpitations headache dizziness syncope or near-syncope. He denies fever chills abdominal pain nausea vomiting diarrhea constipation. He denies dysuria hematuria frequency or urgency. He denies any lower extremity edema or orthopnea. He went to his primary care provider's office today and received IM steroids and breathing treatments. Did not improve  and was referred to the emergency department.   Subjective: 7/3  A/O 4, negative CP positive acute on chronic respiratory distress, positive productive cough (white/clear), negative N/V, negative abdominal pain. States over last week increasing onset of shortness of breath. Negative fever. States normally on 3 L O2 when sedentary--> 5 L O2 on exertion, currently on 10 L O2 sedentary with SPO2 92-94% States was heavy smoker but stopped approximately 20+ years ago    Assessment & Plan:   Principal Problem:   Acute on chronic respiratory failure (HCC) Active Problems:   GERD   Celiac disease   Hypertension   COPD with acute exacerbation (HCC)   Cardiomyopathy, ischemic   Heart failure (HCC)   Chronic kidney disease (CKD), stage III (moderate)  Acute on chronic respiratory failure with hypoxia/COPD exacerbation - likely related to COPD exacerbation. Chest x-ray with severe lung disease. No infiltrates. Over last 6 weeks patient has been treated for COPD exacerbation 2 and community-acquired pneumonia. This treatment included steroids antibiotics and nebulizer treatments. He reports he finished the antibiotics yesterday and steroids are being tapered. Last week he got on airplane to [place] when he got home he had nasal congestion that he says "moved my chest". Oxygen saturation level 87% on 3 L is a cannula which is what he wears at home. He is provided with nebulizers steroids and sent to the emergency department. On admission his respiratory effort is much improved oxygen saturation level greater than 90% on 50% oxygen via Ventimask -on 10 L O2 sedentary with SPO2 92-94% sitting still -Patient with severe Emphysema, treated with multiple rounds of antibiotics and steroid taper. Patient  with deteriorating pulmonary status, obtain chest CT, VQ scan -Solu-Medrol 60 mg QID -Singulair 10 mg daily -DuoNeb QID -Solu-Medrol 60 mgQID -Mucinex BID -flutter valve -When appropriate PT/OT  evaluation  Chronic systolic heart failure.  -Chest x-ray as noted above. BNP within the limits of normal. Does not appear overloaded. Home medications include amiodarone,Entresto Spironolactone. Echo in August 2017 with an EF of 55% -Daily weights -Intake and output -Echocardiogram pending -Continue home meds  HTN -See CHF  Acute Renal Failure (baseline  Cr~1.1)  -Creatinine 1.32 on admission. It appears this is just above baseline. Lab Results  Component Value Date   CREATININE 1.60 (H) 02/22/2017   CREATININE 1.32 (H) 02/21/2017   CREATININE 1.11 02/11/2017  -Normal saline 75 ml/hr monitor for fluid overload -hold neprhotoxin  GERD. Stable at baseline.    DVT prophylaxis: SCD  Code Status: DO NOT RESUSCITATE Family Communication: Wife at bedside Disposition Plan: TBD   Consultants:  None   Procedures/Significant Events:  7/3 CT chest WO contrast:-No enlarged mediastinal or axillary lymph nodes. -Moderate to severe emphysema with scattered areas of scarring, greatest in the upper lobes.  -Bilateral lower lobe cylindrical bronchiectasis noted with multiple rhonchi filled with mucus. Tree-in-bud opacities within both lower lobes is compatible with infection. -A 4 x 3.5 x 2 cm confluent peripheral opacity within the posterior left lower lobe (images 70-79) has a suggestion of some volume loss and probably represents atelectasis.    VENTILATOR SETTINGS: None   Cultures None  Antimicrobials: Anti-infectives    None       Devices None   LINES / TUBES:      Continuous Infusions:   Objective: Vitals:   02/22/17 0002 02/22/17 0011 02/22/17 0340 02/22/17 0434  BP:  112/65  132/72  Pulse:  86  67  Resp:  (!) 24  18  Temp:  98.1 F (36.7 C)  97.8 F (36.6 C)  TempSrc:  Oral  Oral  SpO2: 96% 94% 95% 94%  Weight:    124 lb 11.2 oz (56.6 kg)  Height:        Intake/Output Summary (Last 24 hours) at 02/22/17 0810 Last data filed at 02/22/17  0700  Gross per 24 hour  Intake              930 ml  Output              225 ml  Net              705 ml   Filed Weights   02/21/17 1207 02/21/17 1605 02/22/17 0434  Weight: 126 lb (57.2 kg) 124 lb 8 oz (56.5 kg) 124 lb 11.2 oz (56.6 kg)    Examination:  General: A/O 4, positive acute on chronic respiratory distress, cachectic Eyes: negative scleral hemorrhage, negative anisocoria, negative icterus ENT: Negative Runny nose, negative gingival bleeding, Neck:  Negative scars, masses, torticollis, lymphadenopathy, JVD Lungs: poor air movement diffusely, positive wheezes, negative crackles Cardiovascular: Tachycardic, Regular rhythm without murmur gallop or rub normal S1 and S2 Abdomen: negative abdominal pain, nondistended, positive soft, bowel sounds, no rebound, no ascites, no appreciable mass Extremities: No significant cyanosis, clubbing, or edema bilateral lower extremities Skin: Negative rashes, lesions, ulcers Psychiatric:  Negative depression, negative anxiety, negative fatigue, negative mania  Central nervous system:  Cranial nerves II through XII intact, tongue/uvula midline, all extremities muscle strength 5/5, sensation intact throughout, negative dysarthria, negative expressive aphasia, negative receptive aphasia.  .     Data Reviewed: Care during  the described time interval was provided by me .  I have reviewed this patient's available data, including medical history, events of note, physical examination, and all test results as part of my evaluation. I have personally reviewed and interpreted all radiology studies.  CBC:  Recent Labs Lab 02/21/17 1200 02/22/17 0227  WBC 15.5* 12.4*  HGB 16.7 14.5  HCT 50.1 42.9  MCV 101.0* 103.6*  PLT 269 203   Basic Metabolic Panel:  Recent Labs Lab 02/21/17 1200 02/22/17 0227  NA 141 139  K 4.4 4.5  CL 106 105  CO2 25 24  GLUCOSE 100* 136*  BUN 21* 29*  CREATININE 1.32* 1.60*  CALCIUM 9.7 9.3   GFR: Estimated  Creatinine Clearance: 29.5 mL/min (A) (by C-G formula based on SCr of 1.6 mg/dL (H)). Liver Function Tests: No results for input(s): AST, ALT, ALKPHOS, BILITOT, PROT, ALBUMIN in the last 168 hours. No results for input(s): LIPASE, AMYLASE in the last 168 hours. No results for input(s): AMMONIA in the last 168 hours. Coagulation Profile: No results for input(s): INR, PROTIME in the last 168 hours. Cardiac Enzymes: No results for input(s): CKTOTAL, CKMB, CKMBINDEX, TROPONINI in the last 168 hours. BNP (last 3 results) No results for input(s): PROBNP in the last 8760 hours. HbA1C: No results for input(s): HGBA1C in the last 72 hours. CBG: No results for input(s): GLUCAP in the last 168 hours. Lipid Profile: No results for input(s): CHOL, HDL, LDLCALC, TRIG, CHOLHDL, LDLDIRECT in the last 72 hours. Thyroid Function Tests: No results for input(s): TSH, T4TOTAL, FREET4, T3FREE, THYROIDAB in the last 72 hours. Anemia Panel: No results for input(s): VITAMINB12, FOLATE, FERRITIN, TIBC, IRON, RETICCTPCT in the last 72 hours. Urine analysis:    Component Value Date/Time   COLORURINE YELLOW 01/10/2012 [place] 01/10/2012 1730   LABSPEC 1.016 01/10/2012 1730   PHURINE 5.5 01/10/2012 1730   GLUCOSEU NEGATIVE 01/10/2012 1730   GLUCOSEU NEGATIVE 09/01/2006 1033   HGBUR TRACE (A) 01/10/2012 1730   BILIRUBINUR NEGATIVE 01/10/2012 1730   KETONESUR NEGATIVE 01/10/2012 1730   PROTEINUR NEGATIVE 01/10/2012 1730   UROBILINOGEN 0.2 01/10/2012 1730   NITRITE NEGATIVE 01/10/2012 1730   LEUKOCYTESUR NEGATIVE 01/10/2012 1730   Sepsis Labs: @LABRCNTIP (procalcitonin:4,lacticidven:4)  ) Recent Results (from the past 240 hour(s))  MRSA PCR Screening     Status: None   Collection Time: 02/21/17  4:01 PM  Result Value Ref Range Status   MRSA by PCR NEGATIVE NEGATIVE Final    Comment:        The GeneXpert MRSA Assay (FDA approved for NASAL specimens only), is one component of  a comprehensive MRSA colonization surveillance program. It is not intended to diagnose MRSA infection nor to guide or monitor treatment for MRSA infections.          Radiology Studies: Dg Chest Port 1 View  Result Date: 02/21/2017 CLINICAL DATA:  Pneumonia EXAM: PORTABLE CHEST 1 VIEW COMPARISON:  01/27/2017 FINDINGS: The cardiac silhouette, mediastinal and hilar contours are stable. Stable tortuosity and calcification of the thoracic aorta. Low lung volumes with vascular crowding and bibasilar atelectasis. Severe chronic underlying lung disease with emphysema and pulmonary fibrosis. Stable basilar scarring changes. No definite infiltrates or effusions. Stable calcified granuloma at the right lung base. The bony structures are intact. IMPRESSION: Severe chronic lung disease. Low lung volumes with vascular crowding and bibasilar atelectasis. Stable bibasilar scarring changes. No definite infiltrates or effusions. Electronically Signed   By: Marijo Sanes M.D.   On:  02/21/2017 12:30        Scheduled Meds: . amiodarone  100 mg Oral Daily  . aspirin EC  81 mg Oral Daily  . azelastine  1 spray Each Nare BID  . calcium-vitamin D  1 tablet Oral Daily  . enoxaparin (LOVENOX) injection  40 mg Subcutaneous Q24H  . methylPREDNISolone (SOLU-MEDROL) injection  60 mg Intravenous Q6H  . mometasone-formoterol  2 puff Inhalation BID  . montelukast  10 mg Oral Daily  . multivitamin with minerals  1 tablet Oral Daily  . pantoprazole  40 mg Oral Daily  . sacubitril-valsartan  1 tablet Oral BID  . sodium chloride flush  3 mL Intravenous Q12H  . spironolactone  12.5 mg Oral Daily   Continuous Infusions:   LOS: 1 day    Time spent: 40 minutes    Roark Rufo, Geraldo Docker, MD [facility] Pager [phone]   If 7PM-7AM, please contact night-coverage [email or web address] Password [email or web address] 02/22/2017, 8:10 AM

## 2017-02-22 NOTE — Progress Notes (Signed)
Initial Nutrition Assessment  DOCUMENTATION CODES:   Severe malnutrition in context of chronic illness  INTERVENTION:    Boost Plus PO TID, each supplement provides 360 kcal and 14 gm protein  NUTRITION DIAGNOSIS:   Malnutrition (severe) related to chronic illness (COPD) as evidenced by severe depletion of body fat, severe depletion of muscle mass.  GOAL:   Patient will meet greater than or equal to 90% of their needs  MONITOR:   PO intake, Supplement acceptance, Labs, I & O's  REASON FOR ASSESSMENT:   Consult COPD Protocol  ASSESSMENT:   82 yo male with PMH of celiac disease, diverticulosis, COPD, HTN, HLD, CAD, HF, CKD-III, laryngeal cancer who was admitted on 7/2 with worsening SOB.  Patient and his wife report that he has been eating okay at home. He lost a lot of weight 4 years ago when he started following a gluten free diet (for celiac DZ). Since that time, weight has been stable.  Nutrition-Focused physical exam completed. Findings are severe fat depletion, severe muscle depletion, and no edema.  Labs reviewed. Medications reviewed and include Oscal with D and MVI.  Diet Order:  Diet gluten free Room service appropriate? Yes; Fluid consistency: Thin  Skin:  Reviewed, no issues  Last BM:  unknown  Height:   Ht Readings from Last 1 Encounters:  02/21/17 5' 6"  (1.676 m)    Weight:   Wt Readings from Last 1 Encounters:  02/22/17 124 lb 11.2 oz (56.6 kg)    Ideal Body Weight:  64.5 kg  BMI:  Body mass index is 20.13 kg/m.  Estimated Nutritional Needs:   Kcal:  3614-4315  Protein:  90-100 gm  Fluid:  1.7-1.9 L  EDUCATION NEEDS:   No education needs identified at this time  Molli Barrows, [place], [place], [place] Pager [phone] After Hours Pager [phone]

## 2017-02-23 ENCOUNTER — Other Ambulatory Visit (HOSPITAL_COMMUNITY): Payer: Medicare Other

## 2017-02-23 ENCOUNTER — Inpatient Hospital Stay (HOSPITAL_COMMUNITY): Payer: Medicare Other

## 2017-02-23 DIAGNOSIS — N183 Chronic kidney disease, stage 3 (moderate): Secondary | ICD-10-CM

## 2017-02-23 DIAGNOSIS — J9621 Acute and chronic respiratory failure with hypoxia: Principal | ICD-10-CM

## 2017-02-23 DIAGNOSIS — J441 Chronic obstructive pulmonary disease with (acute) exacerbation: Secondary | ICD-10-CM

## 2017-02-23 MED ORDER — CEFTAZIDIME 2 G IJ SOLR
2.0000 g | INTRAMUSCULAR | Status: DC
  Administered 2017-02-23 – 2017-02-24 (×2): 2 g via INTRAVENOUS
  Filled 2017-02-23 (×2): qty 2

## 2017-02-23 MED ORDER — TECHNETIUM TO 99M ALBUMIN AGGREGATED
4.0000 | Freq: Once | INTRAVENOUS | Status: AC | PRN
  Administered 2017-02-23: 4 via INTRAVENOUS

## 2017-02-23 MED ORDER — TECHNETIUM TC 99M DIETHYLENETRIAME-PENTAACETIC ACID: 30.0000 | Freq: Once | INTRAVENOUS | Status: DC | PRN

## 2017-02-23 NOTE — Progress Notes (Addendum)
[facility] TEAM 1 - Stepdown/ICU TEAM  Bruce Travis  DJS:[id number] DOB: 1935/02/13 DOA: 02/21/2017 PCP: Susy Frizzle, MD    Brief Narrative:  82yo M w/ a Hx Chronic Hypoxic Respiratory failure on 3 L nasal cannula at home, COPD, Chronic Systolic CHF, Pulmonary HTN, HTN, CAD w/ MI, CKD, GERD, Celiac disease, and Laryngeal cancer who presented to the ED w/ acute on chronic respiratory failure with hypoxia likely related to an acute COPD exacerbation.  Subjective: The patient reports that his breathing is improving but he is not yet back to his baseline.  He denies chest pain nausea vomiting or abdominal pain.  Assessment & Plan:  Acute on chronic hypoxic respiratory failure - acute COPD exacerbation - bronchiectasis Still requiring higher level of oxygen support than his 3 L nasal cannula home dose - add anti-pseudomonal abx coverage for a planned 7 day course (added to outpt abx to complete an approx 10-14 day course) - cont nebs - cont assistance w/ expectoration - will need outpt Pulm f/u   4 x 3.5 x 2 cm confluent peripheral opacity within the posterior LLL suggestive of atelectasis but Radiology suggests f/u imaging in 3 months would be wise   Chronic systolic CHF  EF 88% via TTE August 2017 - no clinical evidence of significant volume overload at this time [facility] Weights   02/21/17 1605 02/22/17 0434 02/23/17 0555  Weight: 56.5 kg (124 lb 8 oz) 56.6 kg (124 lb 11.2 oz) 56.8 kg (125 lb 4.8 oz)    HTN Blood pressure currently well controlled  Acute kidney injury on CKD stage III Baseline creatinine approximately 1.1 - creatinine climbing - discontinue ARB and Aldactone for now  Recent Labs Lab 02/21/17 1200 02/22/17 0227  CREATININE 1.32* 1.60*    Severe malnutrition in context of chronic illness  Macrocytosis Check F-02 and folic acid levels  GERD  DVT prophylaxis: lovenox  Code Status: DNR - NO CODE Family Communication: no family present at time of exam    Disposition Plan: SDU  Consultants:  none  Procedures: none  Antimicrobials:  Tressie Ellis 7/4 >  Objective: Blood pressure 122/68, pulse 89, temperature 97.7 F (36.5 C), temperature source Oral, resp. rate (!) 25, height 5' 6"  (1.676 m), weight 56.8 kg (125 lb 4.8 oz), SpO2 95 %.  Intake/Output Summary (Last 24 hours) at 02/23/17 0929 Last data filed at 02/23/17 0500  Gross per 24 hour  Intake            820.5 ml  Output             1595 ml  Net           -774.5 ml   Filed Weights   02/21/17 1605 02/22/17 0434 02/23/17 0555  Weight: 56.5 kg (124 lb 8 oz) 56.6 kg (124 lb 11.2 oz) 56.8 kg (125 lb 4.8 oz)    Examination: General: No acute respiratory distress while at rest in bed  Lungs: Poor air movement throughout all fields  - faint bibasilar crackles  - mild expiratory wheezing  Cardiovascular: Regular rate and rhythm without murmur gallop or rub normal S1 and S2 Abdomen: Nontender, nondistended, soft, bowel sounds positive, no rebound, no ascites, no appreciable mass Extremities: no appreciable edema bilateral lower extremities   CBC:  Recent Labs Lab 02/21/17 1200 02/22/17 0227  WBC 15.5* 12.4*  HGB 16.7 14.5  HCT 50.1 42.9  MCV 101.0* 103.6*  PLT 269 774   Basic Metabolic Panel:  Recent Labs  Lab 02/21/17 1200 02/22/17 0227  NA 141 139  K 4.4 4.5  CL 106 105  CO2 25 24  GLUCOSE 100* 136*  BUN 21* 29*  CREATININE 1.32* 1.60*  CALCIUM 9.7 9.3   GFR: Estimated Creatinine Clearance: 29.6 mL/min (A) (by C-G formula based on SCr of 1.6 mg/dL (H)).  Liver Function Tests: No results for input(s): AST, ALT, ALKPHOS, BILITOT, PROT, ALBUMIN in the last 168 hours. No results for input(s): LIPASE, AMYLASE in the last 168 hours. No results for input(s): AMMONIA in the last 168 hours.   HbA1C: Hgb A1c MFr Bld  Date/Time Value Ref Range Status  02/05/2016 07:10 AM 5.6 4.8 - 5.6 % Final    Comment:    (NOTE)         Pre-diabetes: 5.7 - 6.4          Diabetes: >6.4         Glycemic control for adults with diabetes: <7.0      Recent Results (from the past 240 hour(s))  MRSA PCR Screening     Status: None   Collection Time: 02/21/17  4:01 PM  Result Value Ref Range Status   MRSA by PCR NEGATIVE NEGATIVE Final    Comment:        The GeneXpert MRSA Assay (FDA approved for NASAL specimens only), is one component of a comprehensive MRSA colonization surveillance program. It is not intended to diagnose MRSA infection nor to guide or monitor treatment for MRSA infections.      Scheduled Meds: . amiodarone  100 mg Oral Daily  . aspirin EC  81 mg Oral Daily  . azelastine  1 spray Each Nare BID  . calcium-vitamin D  1 tablet Oral Daily  . dextromethorphan-guaiFENesin  1 tablet Oral BID  . enoxaparin (LOVENOX) injection  30 mg Subcutaneous Q24H  . ipratropium-albuterol  3 mL Nebulization TID  . lactose free nutrition  237 mL Oral TID BM  . methylPREDNISolone (SOLU-MEDROL) injection  60 mg Intravenous Q6H  . mometasone-formoterol  2 puff Inhalation BID  . montelukast  10 mg Oral Daily  . multivitamin with minerals  1 tablet Oral Daily  . pantoprazole  40 mg Oral Daily  . sacubitril-valsartan  1 tablet Oral BID  . sodium chloride flush  3 mL Intravenous Q12H  . spironolactone  12.5 mg Oral Daily     LOS: 2 days   Cherene Altes, MD [facility] Office  [phone] Pager - Text Page per Shea Evans as per below:  On-Call/Text Page:      [email or web address]      password [email or web address]  If 7PM-7AM, please contact night-coverage [email or web address] Password [email or web address] 02/23/2017, 9:29 AM

## 2017-02-23 NOTE — Progress Notes (Signed)
PHARMACY NOTE -  ANTIBIOTIC RENAL DOSE ADJUSTMENT    Patient has been initiated on Ceftazidime for bronchiectasis, with empiric pseudomonal coverage. SCr 1.6, estimated CrCl 30 ml/min.  Cr has increased from 1.1 -> 1.6 in 48hr.  CrCl indicates pt on the line for adjustment between q12 and q24 dosing   Change Ceftazidime 2g IV q24 given rate of Cr increase. Pharmacy will follow-up for need to readjust dosing.   Lewie Chamber., PharmD Clinical Pharmacist [facility]

## 2017-02-24 ENCOUNTER — Other Ambulatory Visit (HOSPITAL_COMMUNITY): Payer: Medicare Other

## 2017-02-24 ENCOUNTER — Encounter (HOSPITAL_COMMUNITY): Payer: Self-pay

## 2017-02-24 ENCOUNTER — Inpatient Hospital Stay (HOSPITAL_COMMUNITY): Payer: Medicare Other

## 2017-02-24 DIAGNOSIS — I272 Pulmonary hypertension, unspecified: Secondary | ICD-10-CM

## 2017-02-24 DIAGNOSIS — I5021 Acute systolic (congestive) heart failure: Secondary | ICD-10-CM | POA: Insufficient documentation

## 2017-02-24 DIAGNOSIS — J9600 Acute respiratory failure, unspecified whether with hypoxia or hypercapnia: Secondary | ICD-10-CM

## 2017-02-24 LAB — COMPREHENSIVE METABOLIC PANEL
ALBUMIN: 2.4 g/dL — AB (ref 3.5–5.0)
ALT: 29 U/L (ref 17–63)
AST: 23 U/L (ref 15–41)
Alkaline Phosphatase: 45 U/L (ref 38–126)
Anion gap: 5 (ref 5–15)
BUN: 32 mg/dL — AB (ref 6–20)
CHLORIDE: 109 mmol/L (ref 101–111)
CO2: 26 mmol/L (ref 22–32)
CREATININE: 1.22 mg/dL (ref 0.61–1.24)
Calcium: 8.7 mg/dL — ABNORMAL LOW (ref 8.9–10.3)
GFR calc Af Amer: >60 mL/min (ref >60)
GFR calc non Af Amer: 54 mL/min — ABNORMAL LOW (ref >60)
Glucose, Bld: 129 mg/dL — ABNORMAL HIGH (ref 65–99)
POTASSIUM: 4.9 mmol/L (ref 3.5–5.1)
SODIUM: 140 mmol/L (ref 135–145)
Total Bilirubin: 0.5 mg/dL (ref 0.3–1.2)
Total Protein: 4.5 g/dL — ABNORMAL LOW (ref 6.5–8.1)

## 2017-02-24 LAB — RETICULOCYTES
RBC.: 3.91 MIL/uL — ABNORMAL LOW (ref 4.22–5.81)
RETIC COUNT ABSOLUTE: 50.8 10*3/uL (ref 19.0–186.0)
RETIC CT PCT: 1.3 % (ref 0.4–3.1)

## 2017-02-24 LAB — ECHOCARDIOGRAM COMPLETE
Height: 66 in
WEIGHTICAEL: 2004.8 [oz_av]

## 2017-02-24 LAB — FERRITIN: FERRITIN: 252 ng/mL (ref 24–336)

## 2017-02-24 LAB — IRON AND TIBC
IRON: 85 ug/dL (ref 45–182)
Saturation Ratios: 49 % — ABNORMAL HIGH (ref 17.9–39.5)
TIBC: 174 ug/dL — AB (ref 250–450)
UIBC: 89 ug/dL

## 2017-02-24 LAB — CBC
HEMATOCRIT: 40.7 % (ref 39.0–52.0)
Hemoglobin: 13.3 g/dL (ref 13.0–17.0)
MCH: 34 pg (ref 26.0–34.0)
MCHC: 32.7 g/dL (ref 30.0–36.0)
MCV: 104.1 fL — ABNORMAL HIGH (ref 78.0–100.0)
PLATELETS: 205 10*3/uL (ref 150–400)
RBC: 3.91 MIL/uL — ABNORMAL LOW (ref 4.22–5.81)
RDW: 15.1 % (ref 11.5–15.5)
WBC: 11.8 10*3/uL — AB (ref 4.0–10.5)

## 2017-02-24 LAB — VITAMIN B12: Vitamin B-12: 808 pg/mL (ref 180–914)

## 2017-02-24 LAB — FOLATE: Folate: 17 ng/mL (ref >5.9)

## 2017-02-24 MED ORDER — PREDNISONE 20 MG PO TABS
40.0000 mg | ORAL_TABLET | Freq: Two times a day (BID) | ORAL | Status: DC
  Administered 2017-02-24 – 2017-02-25 (×2): 40 mg via ORAL
  Filled 2017-02-24 (×2): qty 2

## 2017-02-24 MED ORDER — DEXTROSE 5 % IV SOLN
2.0000 g | Freq: Two times a day (BID) | INTRAVENOUS | Status: AC
  Administered 2017-02-24 – 2017-02-25 (×3): 2 g via INTRAVENOUS
  Filled 2017-02-24 (×3): qty 2

## 2017-02-24 MED ORDER — ENOXAPARIN SODIUM 40 MG/0.4ML ~~LOC~~ SOLN
40.0000 mg | SUBCUTANEOUS | Status: DC
  Administered 2017-02-24 – 2017-02-25 (×2): 40 mg via SUBCUTANEOUS
  Filled 2017-02-24 (×2): qty 0.4

## 2017-02-24 NOTE — Progress Notes (Signed)
Chaplain stopped in while rounding to visit with patient.  Patient is happy that he may get to go home soon.  Patient talks about really appreciating his doctor, Dr. Sherral Hammers.  He appreciates being able to talk with him and he appreciates him listening to his story.  Patient's wife is at bedside.  He also talks about not seeing the fireworks and that he can usually watch them from home.  Patient is appreciative of Chaplain support and extends his hand for me to shake.  Chaplain would like to thank the medical team that takes care of this patient.  Thank you.

## 2017-02-24 NOTE — Progress Notes (Signed)
PHARMACY NOTE -  ANTIBIOTIC RENAL DOSE ADJUSTMENT    Patient has been initiated on Ceftazidime for bronchiectasis, with empiric pseudomonal coverage. SCr initially rose but is now back down to 1.22, estimated CrCl improved to 38 ml/min.  Change Ceftazidime 2g IV q12 given rate of Cr increase. Pharmacy will follow-up for need to readjust dosing.  Carlean Jews, Pharm.D. PGY2 Pharmacy Resident 02/24/2017 12:36 PM Main Pharmacy: [phone]

## 2017-02-24 NOTE — Progress Notes (Signed)
PROGRESS NOTE    Bruce Travis  WHQ:[id number] DOB: [birth year] DOA: 02/21/2017 PCP: Susy Frizzle, MD   Brief Narrative:  82 y.o. WM PMHx Chronic Respiratory failure on 3 L nasal cannula at home, COPD, Chronic Systolic CHF, Pulmonary HTN, HTN, CAD, MI, CKD  GERD, Celiac disease, Laryngeal cancer    Presents to the emergency department from his primary care provider's office with chief complaint acute on chronic respiratory failure with hypoxia likely related to COPD exacerbation.  Information is obtained from the patient and his daughter who is at the bedside. Since states he has been seeing his primary care provider for the last 6 weeks for "chest congestion and blood-tinged sputum". He reports back in early June he experienced some worsening shortness of breath and cough with subjective fevers. He also experienced intermittent blood-tinged sputum. He was seen by his primary care provider and placed on steroids and treated for pneumonia. He states he finished his antibiotics and is in the process of steroid taper. He states he had time periods where his shortness of breath and cough improved and then worsened again presumably from 2 quick of a steroid taper. Last week he boarded airplane went to [place]. Daughter states he came off with "head cold". Last 2 days he reports worsening shortness of breath increased sputum production. He also reports increased oxygen requirements. He usually wears nasal cannula 3 L. He reports he also has a history of throat cancer and saw his ENT yesterday and examination revealed no recurrence of the cancer. He denies chest pain palpitations headache dizziness syncope or near-syncope. He denies fever chills abdominal pain nausea vomiting diarrhea constipation. He denies dysuria hematuria frequency or urgency. He denies any lower extremity edema or orthopnea. He went to his primary care provider's office today and received IM steroids and breathing treatments. Did not improve  and was referred to the emergency department.   Subjective: 7/5   A/O 4, negative CP positive acute on chronic respiratory distress, positive productive cough (white/clear), negative N/V, negative abdominal pain. States over last week increasing onset of shortness of breath. Negative fever. States normally on 3 L O2 when sedentary--> 5 L O2 on exertion, currently on 10 L O2 sedentary with SPO2 92-94% States was heavy smoker but stopped approximately 20+ years ago    Assessment & Plan:   Principal Problem:   Acute on chronic respiratory failure (HCC) Active Problems:   GERD   Celiac disease   Hypertension   COPD with acute exacerbation (HCC)   Cardiomyopathy, ischemic   Heart failure (HCC)   Chronic kidney disease (CKD), stage III (moderate)  Acute on chronic respiratory failure with hypoxia/COPD exacerbation - Multifactorial to include COPD exacerbation and worsening CHF. -CXR severe lung disease. No infiltrates.  -Over last 6 weeks patient has been treated for COPD exacerbation 2 and community-acquired pneumonia.  -on 10 L O2 sedentary with SPO2 92-94% sitting still -Patient with severe Emphysema, treated with multiple rounds of antibiotics and steroid taper. Patient with deteriorating pulmonary status;, Chest CT; shows severe severe COPD with tree-in-bud opacification see results below. VQ scan indeterminate for CTED -Change Solu-Medrol 60 mg QID---> Prednisone 40 mg BID -Singulair 10 mg daily -DuoNeb QID -Mucinex BID -flutter valve -Respiratory status improving, counseled patient and wife that most likely will need to go home on increased O2. Ambulatory SPO2 pending -Schedule reestablish care with Dr. Christinia Gully pulmonology in 1-2 weeks following discharge -PT/OT evaluation, recommend 24-hour supervision  Chronic systolic heart failure.  -Chest  x-ray as noted above. BNP within the limits of normal. Does not appear overloaded.  -Continue Amiodarone 100 mg daily would  seriously consider changing to another agent given patient's poor respiratory status, concern for pulmonary fibrosis. -Ernesto/Spironolactone held secondary to increasing Cr -Echo in August 2017 with an EF of 55% -7/5 Echocardiogram shows worsening CHF.   -Daily weights Filed Weights   02/21/17 1605 02/22/17 0434 02/23/17 0555  Weight: 124 lb 8 oz (56.5 kg) 124 lb 11.2 oz (56.6 kg) 125 lb 4.8 oz (56.8 kg)  -Intake and output since admission +1.3 L -Consult CHF team on 7/6  Pulmonary hypertension -See CHF -Consider starting isosorbide dinitrate  HTN -See CHF  Acute Renal Failure (baseline  Cr~1.1)  -Creatinine 1.32 on admission. It appears this is just above baseline. Lab Results  Component Value Date   CREATININE 1.22 02/24/2017   CREATININE 1.60 (H) 02/22/2017   CREATININE 1.32 (H) 02/21/2017  -Normal saline 40 ml/hr monitor for fluid overload -hold neprhotoxin  GERD. Stable at baseline.    DVT prophylaxis: SCD  Code Status: DO NOT RESUSCITATE Family Communication: Wife at bedside Disposition Plan: TBD   Consultants:  None   Procedures/Significant Events:  7/3 CT chest WO contrast:-No enlarged mediastinal or axillary lymph nodes. -Moderate to severe emphysema with scattered areas of scarring, greatest in the upper lobes.  -Bilateral lower lobe cylindrical bronchiectasis noted with multiple rhonchi filled with mucus. Tree-in-bud opacities within both lower lobes is compatible with infection. -A 4 x 3.5 x 2 cm confluent peripheral opacity within the posterior left lower lobe (images 70-79) has a suggestion of some volume loss and probably represents atelectasis. 7/4 VQ scan: Indeterminate for PE 7/5 Echocardiogram: LVEF= 40% to 45%. -Doppler parameters are   consistent with both elevated ventricular end-diastolic filling   pressure and elevated left atrial filling pressure. -Mitral valve: Tmoderate regurgitation.- Right ventricle:  moderately dilated. - Pulmonary  arteries: PA peak pressure: 53 mm Hg (S).   VENTILATOR SETTINGS: None   Cultures None  Antimicrobials: Anti-infectives    Start     Stop   02/23/17 1100  cefTAZidime (FORTAZ) 2 g in dextrose 5 % 50 mL IVPB    Comments:  Pharmacy may adjust dose as indicated           Devices None   LINES / TUBES:      Continuous Infusions: . sodium chloride 75 mL/hr at 02/24/17 0400  . cefTAZidime (FORTAZ)  IV Stopped (02/23/17 1130)     Objective: Vitals:   02/23/17 1705 02/23/17 2026 02/23/17 2341 02/24/17 0448  BP: 113/64  (!) 152/72 109/69  Pulse:   95 93  Resp:   (!) 22 (!) 21  Temp: 97.8 F (36.6 C)  97.6 F (36.4 C) 97.8 F (36.6 C)  TempSrc: Oral  Oral Oral  SpO2: 97% 95% 90% 94%  Weight:      Height:        Intake/Output Summary (Last 24 hours) at 02/24/17 0728 Last data filed at 02/24/17 1423  Gross per 24 hour  Intake          2059.25 ml  Output              900 ml  Net          1159.25 ml   Filed Weights   02/21/17 1605 02/22/17 0434 02/23/17 0555  Weight: 124 lb 8 oz (56.5 kg) 124 lb 11.2 oz (56.6 kg) 125 lb 4.8 oz (56.8 kg)  Examination:  General: A/O 4, positive acute on chronic respiratory distress, cachectic Eyes: negative scleral hemorrhage, negative anisocoria, negative icterus ENT: Negative Runny nose, negative gingival bleeding, Neck:  Negative scars, masses, torticollis, lymphadenopathy, JVD Lungs: poor air movement diffusely, positive wheezes, negative crackles Cardiovascular: Tachycardic, Regular rhythm without murmur gallop or rub normal S1 and S2 Abdomen: negative abdominal pain, nondistended, positive soft, bowel sounds, no rebound, no ascites, no appreciable mass Extremities: No significant cyanosis, clubbing, or edema bilateral lower extremities Skin: Negative rashes, lesions, ulcers Psychiatric:  Negative depression, negative anxiety, negative fatigue, negative mania  Central nervous system:  Cranial nerves II through XII  intact, tongue/uvula midline, all extremities muscle strength 5/5, sensation intact throughout, negative dysarthria, negative expressive aphasia, negative receptive aphasia.  .     Data Reviewed: Care during the described time interval was provided by me .  I have reviewed this patient's available data, including medical history, events of note, physical examination, and all test results as part of my evaluation. I have personally reviewed and interpreted all radiology studies.  CBC:  Recent Labs Lab 02/21/17 1200 02/22/17 0227 02/24/17 0417  WBC 15.5* 12.4* 11.8*  HGB 16.7 14.5 13.3  HCT 50.1 42.9 40.7  MCV 101.0* 103.6* 104.1*  PLT 269 218 742   Basic Metabolic Panel:  Recent Labs Lab 02/21/17 1200 02/22/17 0227 02/24/17 0417  NA 141 139 140  K 4.4 4.5 4.9  CL 106 105 109  CO2 25 24 26   GLUCOSE 100* 136* 129*  BUN 21* 29* 32*  CREATININE 1.32* 1.60* 1.22  CALCIUM 9.7 9.3 8.7*   GFR: Estimated Creatinine Clearance: 38.8 mL/min (by C-G formula based on SCr of 1.22 mg/dL). Liver Function Tests:  Recent Labs Lab 02/24/17 0417  AST 23  ALT 29  ALKPHOS 45  BILITOT 0.5  PROT 4.5*  ALBUMIN 2.4*   No results for input(s): LIPASE, AMYLASE in the last 168 hours. No results for input(s): AMMONIA in the last 168 hours. Coagulation Profile: No results for input(s): INR, PROTIME in the last 168 hours. Cardiac Enzymes: No results for input(s): CKTOTAL, CKMB, CKMBINDEX, TROPONINI in the last 168 hours. BNP (last 3 results) No results for input(s): PROBNP in the last 8760 hours. HbA1C: No results for input(s): HGBA1C in the last 72 hours. CBG: No results for input(s): GLUCAP in the last 168 hours. Lipid Profile: No results for input(s): CHOL, HDL, LDLCALC, TRIG, CHOLHDL, LDLDIRECT in the last 72 hours. Thyroid Function Tests: No results for input(s): TSH, T4TOTAL, FREET4, T3FREE, THYROIDAB in the last 72 hours. Anemia Panel:  Recent Labs  02/24/17 0417    VITAMINB12 808  FOLATE 17.0  FERRITIN 252  TIBC 174*  IRON 85  RETICCTPCT 1.3   Urine analysis:    Component Value Date/Time   COLORURINE YELLOW 01/10/2012 1730   APPEARANCEUR CLEAR 01/10/2012 1730   LABSPEC 1.016 01/10/2012 1730   PHURINE 5.5 01/10/2012 1730   GLUCOSEU NEGATIVE 01/10/2012 1730   GLUCOSEU NEGATIVE 09/01/2006 1033   HGBUR TRACE (A) 01/10/2012 1730   BILIRUBINUR NEGATIVE 01/10/2012 1730   KETONESUR NEGATIVE 01/10/2012 1730   PROTEINUR NEGATIVE 01/10/2012 1730   UROBILINOGEN 0.2 01/10/2012 1730   NITRITE NEGATIVE 01/10/2012 1730   LEUKOCYTESUR NEGATIVE 01/10/2012 1730   Sepsis Labs: @LABRCNTIP (procalcitonin:4,lacticidven:4)  ) Recent Results (from the past 240 hour(s))  MRSA PCR Screening     Status: None   Collection Time: 02/21/17  4:01 PM  Result Value Ref Range Status   MRSA by PCR NEGATIVE NEGATIVE  Final    Comment:        The GeneXpert MRSA Assay (FDA approved for NASAL specimens only), is one component of a comprehensive MRSA colonization surveillance program. It is not intended to diagnose MRSA infection nor to guide or monitor treatment for MRSA infections.          Radiology Studies: Dg Chest 2 View  Result Date: 02/23/2017 CLINICAL DATA:  Dyspnea, pre VQ lung scan EXAM: CHEST  2 VIEW COMPARISON:  02/21/2017 CXR, 07/26/2004 CXR 02/22/2017 chest CT FINDINGS: Emphysematous hyperinflation of the lungs. The heart is normal in size. There is aortic atherosclerosis. Patchy parenchymal scarring is noted in both upper lobes with slightly more confluent ill-defined but predominantly linear density in the left midlung. Calcified 9 mm nodule projects over the right lower lobe as before. No effusion or pneumothorax. No acute nor suspicious osseous lesions. IMPRESSION: Emphysematous hyperinflation of the lungs with pleuroparenchymal scarring bilaterally. Pulmonary nodule in the right lobe is stable dating back to 2005. Electronically Signed   By: Ashley Royalty M.D.   On: 02/23/2017 15:31   Ct Chest Wo Contrast  Result Date: 02/22/2017 CLINICAL DATA:  82 year old male with acute shortness of breath and acute respiratory failure. EXAM: CT CHEST WITHOUT CONTRAST TECHNIQUE: Multidetector CT imaging of the chest was performed following the standard protocol without IV contrast. COMPARISON:  07/05/2014 CT.  02/21/2017 and prior radiographs FINDINGS: Cardiovascular: Upper limits normal heart size again noted. Coronary artery and thoracic aortic atherosclerotic calcifications noted without thoracic aortic aneurysm. There is no evidence of pericardial effusion. Mediastinum/Nodes: No enlarged mediastinal or axillary lymph nodes. Thyroid gland, trachea, and esophagus demonstrate no significant findings. Lungs/Pleura: Moderate to severe emphysema again identified with scattered areas of scarring, greatest in the upper lobes. Bilateral lower lobe cylindrical bronchiectasis noted with multiple rhonchi filled with mucus. Tree-in-bud opacities within both lower lobes is compatible with infection. A 4 x 3.5 x 2 cm confluent peripheral opacity within the posterior left lower lobe (images 70-79) has a suggestion of some volume loss and probably represents atelectasis. No suspicious nodule, mass, pleural effusion or pneumothorax identified. Upper Abdomen: No acute abnormality Musculoskeletal: No chest wall mass or suspicious bone lesions identified. IMPRESSION: Bilateral lower lobe bronchiectasis with bilateral lower lobe tree-in-bud opacities compatible with infection. Mucous fills multiple bilateral lower lobe bronchi. 4 x 3.5 x 2 cm confluent peripheral opacity within the posterior left lower lobe is suggestive of atelectasis. However, follow-up CT in 3-6 months recommended. Coronary artery disease. Aortic Atherosclerosis (ICD10-I70.0) and Emphysema (ICD10-J43.9). Electronically Signed   By: Margarette Canada M.D.   On: 02/22/2017 18:04   Nm Pulmonary Perf And Vent  Result Date:  02/23/2017 CLINICAL DATA:  Shortness of breath EXAM: NUCLEAR MEDICINE VENTILATION - PERFUSION LUNG SCAN TECHNIQUE: Ventilation images were obtained in multiple projections using inhaled aerosol Tc-54mDTPA. Perfusion images were obtained in multiple projections after intravenous injection of Tc-94mAA. RADIOPHARMACEUTICALS:  30 mCi Technetium-9930mPA aerosol inhalation and 4 mCi Technetium-23m59m IV COMPARISON:  CT chest without contrast 02/22/2017 FINDINGS: Ventilation: Markedly heterogeneous distribution of ventilatory agent with evidence of central aggregation. Perfusion: No large peripheral wedge-shaped perfusion defects are seen in the lower lungs. Upper lung perfusion is heterogeneous with some areas of apparent subsegmental peripheral non perfusion. IMPRESSION: Markedly limited study by heterogeneity of the ventilation agent which precludes assessment for matched defects. Patchy decreased perfusion with some subsegmental areas in the upper lungs noted in this patient with marked emphysema. Study is indeterminate for pulmonary  embolus. Electronically Signed   By: Misty Stanley M.D.   On: 02/23/2017 16:06        Scheduled Meds: . amiodarone  100 mg Oral Daily  . aspirin EC  81 mg Oral Daily  . azelastine  1 spray Each Nare BID  . calcium-vitamin D  1 tablet Oral Daily  . dextromethorphan-guaiFENesin  1 tablet Oral BID  . enoxaparin (LOVENOX) injection  30 mg Subcutaneous Q24H  . ipratropium-albuterol  3 mL Nebulization TID  . lactose free nutrition  237 mL Oral TID BM  . methylPREDNISolone (SOLU-MEDROL) injection  60 mg Intravenous Q6H  . montelukast  10 mg Oral Daily  . multivitamin with minerals  1 tablet Oral Daily  . pantoprazole  40 mg Oral Daily  . sodium chloride flush  3 mL Intravenous Q12H   Continuous Infusions: . sodium chloride 75 mL/hr at 02/24/17 0400  . cefTAZidime (FORTAZ)  IV Stopped (02/23/17 1130)     LOS: 3 days    Time spent: 40 minutes    WOODS, Geraldo Docker, MD [facility] Pager [phone]   If 7PM-7AM, please contact night-coverage [email or web address] Password [email or web address] 02/24/2017, 7:28 AM

## 2017-02-24 NOTE — Progress Notes (Signed)
Physical Therapy Treatment Patient Details Name: Bruce Travis MRN: [id number] DOB: 1935/06/09 Today's Date: 02/24/2017    History of Present Illness 82 yo male with onset of acute respiratory failure was on 3L O2 at home, had lung scarring and bloody sputum over the last week.  Hypoxic, exacerbated COPD, increased O2 need.  PMHx:  COPD, CHF, HTN, CKD 3, GERD, celiac disease, CAD, EF 55%    PT Comments    Pt progressing well.  He was able to maintain 90% or better on 6L with efficient breathing until last leg or gait with stairs added and then pt dropped to 88%   Follow Up Recommendations  Supervision/Assistance - 24 hour;Supervision for mobility/OOB;Home health PT     Equipment Recommendations       Recommendations for Other Services       Precautions / Restrictions Precautions Precaution Comments: watch 02    Mobility  Bed Mobility Overal bed mobility: Needs Assistance Bed Mobility: Supine to Sit;Sit to Supine     Supine to sit: Supervision Sit to supine: Supervision   General bed mobility comments: extra time  Transfers Overall transfer level: Modified independent   Transfers: Sit to/from Stand Sit to Stand: Modified independent (Device/Increase time)         General transfer comment: from 3 in 1 and from chair  Ambulation/Gait Ambulation/Gait assistance: Supervision Ambulation Distance (Feet): 220 Feet (with standing rest at mid point)   Gait Pattern/deviations: Step-through pattern   Gait velocity interpretation: at or above normal speed for age/gender General Gait Details: steady gait overall with pt maintaining sats at 90% on 6L [place] intially with EHR in the 100's, on the return leg with stairs added in the SpO2 on 6L dropped to 88% with noticeable, but mild dyspnea.  Pt recovered to 90% or better within 20 secs at rest.   Stairs Stairs: Yes   Stair Management: One rail Right;Alternating pattern;Forwards Number of Stairs: 2 General stair comments: safe  with rail though guarded.  Wheelchair Mobility    Modified Rankin (Stroke Patients Only)       Balance Overall balance assessment: Needs assistance   Sitting balance-Leahy Scale: Good       Standing balance-Leahy Scale: Good                              Cognition Arousal/Alertness: Awake/alert Behavior During Therapy: WFL for tasks assessed/performed Overall Cognitive Status: Within Functional Limits for tasks assessed                                        Exercises Other Exercises Other Exercises: Pt with extensive knowledge in energy conservation techniques and able to generalize. Recommended shower seat, wife will purchase on her own.    General Comments General comments (skin integrity, edema, etc.): pt relayed his knowledge of breathing and energy conservation that he has learned in CRP2      Pertinent Vitals/Pain Pain Assessment: No/denies pain    Home Living                      Prior Function            PT Goals (current goals can now be found in the care plan section) Acute Rehab PT Goals Patient Stated Goal: to return home PT Goal Formulation: With patient/family Time For  Goal Achievement: 03/08/17 Potential to Achieve Goals: Good Progress towards PT goals: Progressing toward goals    Frequency    Min 3X/week      PT Plan Current plan remains appropriate    Co-evaluation              AM-PAC PT "6 Clicks" Daily Activity  Outcome Measure  Difficulty turning over in bed (including adjusting bedclothes, sheets and blankets)?: A Little Difficulty moving from lying on back to sitting on the side of the bed? : A Little Difficulty sitting down on and standing up from a chair with arms (e.g., wheelchair, bedside commode, etc,.)?: A Little Help needed moving to and from a bed to chair (including a wheelchair)?: A Little Help needed walking in hospital room?: A Little Help needed climbing 3-5 steps with  a railing? : A Little 6 Click Score: 18    End of Session Equipment Utilized During Treatment: Oxygen Activity Tolerance: Patient tolerated treatment well;Patient limited by fatigue Patient left: in bed;with call bell/phone within reach;with family/visitor present Nurse Communication: Mobility status PT Visit Diagnosis: Other abnormalities of gait and mobility (R26.89)     Time: 9980-6999 PT Time Calculation (min) (ACUTE ONLY): 21 min  Charges:  $Gait Training: 8-22 mins                    G Codes:       March 17, 2017  Donnella Sham, PT [phone]  (pager)   Tessie Fass Cami Delawder March 17, 2017, 4:22 PM

## 2017-02-24 NOTE — Progress Notes (Signed)
Occupational Therapy Treatment and Discharge Patient Details Name: Bruce Travis MRN: [id number] DOB: 05/03/1935 Today's Date: 02/24/2017    History of present illness 82 yo male with onset of acute respiratory failure was on 3L O2 at home, had lung scarring and bloody sputum over the last week.  Hypoxic, exacerbated COPD, increased O2 need.  PMHx:  COPD, CHF, HTN, CKD 3, GERD, celiac disease, CAD, EF 55%   OT comments  Pt able to complete self care with 3 in 1 at sink modified independently. He generalizes energy conservation strategies and breathing technique in mobility and ADL. No further OT needs. Wife to purchase tub seat in the community.  Pt walked in halls at MD's request. SpO2 95% prior to ambulation on 4L 02 with desaturation to 87% with difficulty rebounding with standing rest break, increased 02 to 6L with rebound to 92%. Upon completion of walk, back down to 88% requiring seated rest break with rebound to 95%, left pt at rest on 4L at 95%  Follow Up Recommendations  No OT follow up    Equipment Recommendations  None recommended by OT    Recommendations for Other Services      Precautions / Restrictions Precautions Precaution Comments: watch 02       Mobility Bed Mobility               General bed mobility comments: pt received in chair  Transfers Overall transfer level: Modified independent   Transfers: Sit to/from Stand Sit to Stand: Modified independent (Device/Increase time)         General transfer comment: from 3 in 1 and from chair    Balance Overall balance assessment: Needs assistance   Sitting balance-Leahy Scale: Good       Standing balance-Leahy Scale: Good                             ADL either performed or assessed with clinical judgement   ADL Overall ADL's : Modified independent                                       General ADL Comments: pt performed ADL sitting and standing at sink from 3 in 1      Vision       Perception     Praxis      Cognition Arousal/Alertness: Awake/alert Behavior During Therapy: WFL for tasks assessed/performed Overall Cognitive Status: Within Functional Limits for tasks assessed                                          Exercises Other Exercises Other Exercises: Pt with extensive knowledge in energy conservation techniques and able to generalize. Recommended shower seat, wife will purchase on her own.   Shoulder Instructions       General Comments      Pertinent Vitals/ Pain       Pain Assessment: No/denies pain  Home Living                                          Prior Functioning/Environment                Frequency  Min 2X/week        Progress Toward Goals  OT Goals(current goals can now be found in the care plan section)  Progress towards OT goals: Goals met/education completed, patient discharged from OT  Acute Rehab OT Goals Patient Stated Goal: to return home  Plan Discharge plan remains appropriate    Co-evaluation                 AM-PAC PT "6 Clicks" Daily Activity     Outcome Measure   Help from another person eating meals?: None Help from another person taking care of personal grooming?: None Help from another person toileting, which includes using toliet, bedpan, or urinal?: None Help from another person bathing (including washing, rinsing, drying)?: None Help from another person to put on and taking off regular upper body clothing?: None Help from another person to put on and taking off regular lower body clothing?: None 6 Click Score: 24    End of Session Equipment Utilized During Treatment: Oxygen;Gait belt  OT Visit Diagnosis: Other (comment) (decreased activity tolerance)   Activity Tolerance Patient tolerated treatment well   Patient Left in bed;with call bell/phone within reach;with nursing/sitter in room;with family/visitor present (EOB)   Nurse  Communication  (02 sats with ambulation)        Time: 6295-2841 OT Time Calculation (min): 58 min  Charges: OT General Charges $OT Visit: 1 Procedure OT Treatments $Self Care/Home Management : 38-52 mins   Malka So 02/24/2017, 1:25 PM  [phone]

## 2017-02-24 NOTE — Progress Notes (Signed)
  Echocardiogram 2D Echocardiogram has been performed.  Darlina Sicilian M 02/24/2017, 11:01 AM

## 2017-02-25 ENCOUNTER — Inpatient Hospital Stay: Admission: RE | Admit: 2017-02-25 | Payer: Medicare Other | Source: Ambulatory Visit

## 2017-02-25 MED ORDER — PREDNISONE 20 MG PO TABS
20.0000 mg | ORAL_TABLET | Freq: Two times a day (BID) | ORAL | Status: DC
  Administered 2017-02-25 – 2017-02-26 (×2): 20 mg via ORAL
  Filled 2017-02-25 (×2): qty 1

## 2017-02-25 MED ORDER — CIPROFLOXACIN HCL 500 MG PO TABS
500.0000 mg | ORAL_TABLET | Freq: Two times a day (BID) | ORAL | Status: DC
  Administered 2017-02-26: 500 mg via ORAL
  Filled 2017-02-25: qty 1

## 2017-02-25 MED ORDER — SACUBITRIL-VALSARTAN 49-51 MG PO TABS
1.0000 | ORAL_TABLET | Freq: Two times a day (BID) | ORAL | Status: DC
  Administered 2017-02-25 – 2017-02-26 (×2): 1 via ORAL
  Filled 2017-02-25 (×2): qty 1

## 2017-02-25 NOTE — Progress Notes (Addendum)
[place] TEAM 1 - Stepdown/ICU TEAM  Bruce Travis  TKZ:[id number] DOB: July 07, 1935 DOA: 02/21/2017 PCP: Susy Frizzle, MD    Brief Narrative:  82yo M w/ a Hx Chronic Hypoxic Respiratory failure on 3 L nasal cannula at home, COPD, Chronic Systolic CHF, Pulmonary HTN, HTN, CAD w/ MI, CKD, GERD, Celiac disease, and Laryngeal cancer who presented to the ED w/ acute on chronic respiratory failure with hypoxia likely related to an acute COPD exacerbation.  Subjective: The patient states he is feeling much better overall.  We have been working on weaning his oxygen today.  At present he is resting comfortably on 3 L nasal cannula but does require 6 L with the slightest bit of exertion.  He denies chest pain fevers chills nausea or vomiting.  Assessment & Plan:  Acute on chronic hypoxic respiratory failure - acute COPD exacerbation - bronchiectasis Cont anti-pseudomonal abx coverage for a planned 7 day course (added to outpt abx to complete an approx 10-14 day course) - cont nebs - cont assistance w/ expectoration - will need outpt Pulm f/u - hopeful for d/c home in AM if can remain stable in regard to O2 needs over night   4 x 3.5 x 2 cm confluent peripheral opacity within the posterior LLL suggestive of atelectasis but Radiology suggests f/u imaging in 3 months would be wise   Chronic systolic CHF - PVC cardiomyopathy  EF 55% via TTE August 2017 - no clinical evidence of significant volume overload at this time - w/ improved renal fxn will resume [employer]   02/22/17 0434 02/23/17 0555 02/25/17 0350  Weight: 56.6 kg (124 lb 11.2 oz) 56.8 kg (125 lb 4.8 oz) 59.3 kg (130 lb 11.2 oz)    HTN BP elevated - resume Entresto and follow   Acute kidney injury on CKD stage III Baseline creatinine approximately 1.1 - creatinine has improved - follow w/ resumption of Entresto  Recent Labs Lab 02/21/17 1200 02/22/17 0227 02/24/17 0417  CREATININE 1.32* 1.60* 1.22    Severe  malnutrition in context of chronic illness  Macrocytosis T-73 and folic acid levels normal   GERD  DVT prophylaxis: lovenox  Code Status: DNR - NO CODE Family Communication: Spoke with wife at bedside Disposition Plan: Plan for discharge home in a.m. if oxygen requirements stable overnight  Consultants:  none  Procedures: none  Antimicrobials:  Fortaz 7/4 > 7/6  Objective: Blood pressure (!) 158/75, pulse 80, temperature (!) 97.5 F (36.4 C), temperature source Oral, resp. rate 14, height 5' 6"  (1.676 m), weight 59.3 kg (130 lb 11.2 oz), SpO2 95 %.  Intake/Output Summary (Last 24 hours) at 02/25/17 1450 Last data filed at 02/25/17 1353  Gross per 24 hour  Intake             1575 ml  Output             2700 ml  Net            -1125 ml   Filed Weights   02/22/17 0434 02/23/17 0555 02/25/17 0350  Weight: 56.6 kg (124 lb 11.2 oz) 56.8 kg (125 lb 4.8 oz) 59.3 kg (130 lb 11.2 oz)    Examination: General: No acute respiratory distress up in chair  Lungs: Poor air movement throughout all fields  - no wheezing or crackles  Cardiovascular: Regular rate and rhythm without murmur  Abdomen: Nontender, nondistended, soft, bowel sounds positive, no rebound, no ascites, no appreciable mass Extremities: no signif  edema bilateral lower extremities   CBC:  Recent Labs Lab 02/21/17 1200 02/22/17 0227 02/24/17 0417  WBC 15.5* 12.4* 11.8*  HGB 16.7 14.5 13.3  HCT 50.1 42.9 40.7  MCV 101.0* 103.6* 104.1*  PLT 269 218 924   Basic Metabolic Panel:  Recent Labs Lab 02/21/17 1200 02/22/17 0227 02/24/17 0417  NA 141 139 140  K 4.4 4.5 4.9  CL 106 105 109  CO2 25 24 26   GLUCOSE 100* 136* 129*  BUN 21* 29* 32*  CREATININE 1.32* 1.60* 1.22  CALCIUM 9.7 9.3 8.7*   GFR: Estimated Creatinine Clearance: 40.5 mL/min (by C-G formula based on SCr of 1.22 mg/dL).  Liver Function Tests:  Recent Labs Lab 02/24/17 0417  AST 23  ALT 29  ALKPHOS 45  BILITOT 0.5  PROT 4.5*    ALBUMIN 2.4*     HbA1C: Hgb A1c MFr Bld  Date/Time Value Ref Range Status  02/05/2016 07:10 AM 5.6 4.8 - 5.6 % Final    Comment:    (NOTE)         Pre-diabetes: 5.7 - 6.4         Diabetes: >6.4         Glycemic control for adults with diabetes: <7.0      Recent Results (from the past 240 hour(s))  MRSA PCR Screening     Status: None   Collection Time: 02/21/17  4:01 PM  Result Value Ref Range Status   MRSA by PCR NEGATIVE NEGATIVE Final    Comment:        The GeneXpert MRSA Assay (FDA approved for NASAL specimens only), is one component of a comprehensive MRSA colonization surveillance program. It is not intended to diagnose MRSA infection nor to guide or monitor treatment for MRSA infections.      Scheduled Meds: . amiodarone  100 mg Oral Daily  . aspirin EC  81 mg Oral Daily  . azelastine  1 spray Each Nare BID  . calcium-vitamin D  1 tablet Oral Daily  . dextromethorphan-guaiFENesin  1 tablet Oral BID  . enoxaparin (LOVENOX) injection  40 mg Subcutaneous Q24H  . ipratropium-albuterol  3 mL Nebulization TID  . lactose free nutrition  237 mL Oral TID BM  . montelukast  10 mg Oral Daily  . multivitamin with minerals  1 tablet Oral Daily  . pantoprazole  40 mg Oral Daily  . predniSONE  40 mg Oral BID WC  . sodium chloride flush  3 mL Intravenous Q12H     LOS: 4 days   Cherene Altes, MD [facility] Office  [phone] Pager - Text Page per Shea Evans as per below:  On-Call/Text Page:      [email or web address]      password [email or web address]  If 7PM-7AM, please contact night-coverage [email or web address] Password [email or web address] 02/25/2017, 2:50 PM

## 2017-02-25 NOTE — Progress Notes (Signed)
SATURATION QUALIFICATIONS: (This note is used to comply with regulatory documentation for home oxygen)  Patient Saturations at Rest = 93% 3L of 02  Patient Saturations on 3L of 02 while Ambulating = 85%3L  Patient Saturations on 6 Liters of oxygen while Ambulating = 90%  Please briefly explain why patient needs home oxygen:  Pt wears oxygen at home. Pt needs 6l of oxygen while ambulating to help with SOB.

## 2017-02-25 NOTE — Care Management Important Message (Signed)
Important Message  Patient Details  Name: Bruce Travis MRN: [id number] Date of Birth: Jun 25, 1935   Medicare Important Message Given:  Yes    Winefred Hillesheim 02/25/2017, 12:06 PM

## 2017-02-26 DIAGNOSIS — I10 Essential (primary) hypertension: Secondary | ICD-10-CM

## 2017-02-26 DIAGNOSIS — I255 Ischemic cardiomyopathy: Secondary | ICD-10-CM

## 2017-02-26 LAB — BASIC METABOLIC PANEL
ANION GAP: 4 — AB (ref 5–15)
BUN: 31 mg/dL — ABNORMAL HIGH (ref 6–20)
CALCIUM: 8.7 mg/dL — AB (ref 8.9–10.3)
CO2: 29 mmol/L (ref 22–32)
Chloride: 105 mmol/L (ref 101–111)
Creatinine, Ser: 1.2 mg/dL (ref 0.61–1.24)
GFR calc Af Amer: >60 mL/min (ref >60)
GFR, EST NON AFRICAN AMERICAN: 55 mL/min — AB (ref >60)
Glucose, Bld: 118 mg/dL — ABNORMAL HIGH (ref 65–99)
Potassium: 4.6 mmol/L (ref 3.5–5.1)
Sodium: 138 mmol/L (ref 135–145)

## 2017-02-26 MED ORDER — CIPROFLOXACIN HCL 500 MG PO TABS
500.0000 mg | ORAL_TABLET | Freq: Two times a day (BID) | ORAL | 0 refills | Status: DC
Start: 2017-02-26 — End: 2017-03-04

## 2017-02-26 NOTE — Discharge Summary (Signed)
DISCHARGE SUMMARY  Bruce Travis  MR#: [id number]  DOB:1934-12-08  Date of Admission: 02/21/2017 Date of Discharge: 02/26/2017  Attending Physician:Tahmir Kleckner T  Patient's WPY:[id number], Cammie Mcgee, MD  Consults:  none  Disposition: D/C home   Follow-up Appts: Follow-up Information    Tanda Rockers, MD. Schedule an appointment as soon as possible for a visit in 2 week(s).   Specialty:  Pulmonary Disease Why:  Schedule reestablish care with Dr. Christinia Gully pulmonology in 1-2 weeks following discharge Contact information: [address] [place] [address] [phone]        [facility] Follow up.   Why:  Oxygen Contact information: [place] [address] [phone]        Susy Frizzle, MD Follow up in 5 day(s).   Specialty:  Family Medicine Contact information: [address] [place] [address] [phone]           Tests Needing Follow-up: -f/u imaging of posterior LLL is suggested in ~10month  -monitoring of renal function is suggested   Discharge Diagnoses:  Acute on chronic hypoxic respiratory failure Acute COPD exacerbation Bronchiectasis 4 x 3.5 x 2 cm confluent peripheral opacity within the posterior LLL Chronic systolic CHF - PVC cardiomyopathy  HTN Acute kidney injury on CKD stage III Severe malnutrition in context of chronic illness Macrocytosis GERD  Initial presentation: 89yoM w/ a Hx Chronic Hypoxic Respiratory failure on3 L nasal cannula at home, COPD, Chronic Systolic CHF, Pulmonary HTN, HTN, CAD w/ MI, CKD, GERD, Celiac disease, and Laryngeal CA who presented to the ED w/ acute on chronic respiratory failure with hypoxia.  Hospital Course:  Acute on chronic hypoxic respiratory failure - acute COPD exacerbation - bronchiectasis Cont anti-pseudomonal abx coverage for a planned 7 day course (added to outpt abx to complete an approx 10-14 day course) - will need outpt Pulm f/u - doing  well w/ no acute sob or cp at time of d/c   4 x 3.5 x 2 cm confluent peripheral opacity within the posterior LLL suggestive of atelectasis but Radiology suggests f/u imaging in 3 months would be wise   Chronic systolic CHF - PVC cardiomyopathy  EF 55% via TTE August 2017 - no clinical evidence of significant volume overload during this hospital stay - w/ improved renal fxn resumed Entresto prior to d/c [employer]  02/23/17 0555 02/25/17 0350 02/26/17 0500  Weight: 56.8 kg (125 lb 4.8 oz) 59.3 kg (130 lb 11.2 oz) 59 kg (130 lb)    HTN BP well controlled at time of d/c   Acute kidney injury on CKD stage III Baseline creatinine approximately 1.1 - creatinine has improved during hospital stay - follow w/ resumption of Entresto  Recent Labs Lab 02/21/17 1200 02/22/17 0227 02/24/17 0417 02/26/17 0209  CREATININE 1.32* 1.60* 1.22 1.20   Severe malnutrition in context of chronic illness  Macrocytosis BG-99and folic acid levels normal   GERD Well controlled   Allergies as of 02/26/2017      Reactions   Codeine Other (See Comments)   Thought head was going to blow off/ severe headache   Adhesive [tape] Other (See Comments)   Band-aids - tears skin off   Gluten Meal Other (See Comments)   Celiac disease      Medication List    TAKE these medications   albuterol 108 (90 Base) MCG/ACT inhaler Commonly known as:  PROAIR HFA INHALE 2 PUFFS EVERY  4 HOURS AS NEEDED FOR SHORTNESS OF BREATH. What changed:  how much to take  how to take this  when to take this  reasons to take this  additional instructions   amiodarone 200 MG tablet Commonly known as:  PACERONE Take 0.5 tablets (100 mg total) by mouth daily.   aspirin EC 81 MG tablet Take 81 mg by mouth daily.   azelastine 0.1 % nasal spray Commonly known as:  ASTELIN PLACE 2 SPRAYS INTO EACH NOSTRILS TWICE DAILY AS DIRECTED. What changed:  See the new instructions.   CALTRATE 600+D 600-400 MG-UNIT  tablet Generic drug:  Calcium Carbonate-Vitamin D Take 1 tablet by mouth daily.   ciprofloxacin 500 MG tablet Commonly known as:  CIPRO Take 1 tablet (500 mg total) by mouth 2 (two) times daily.   ENTRESTO 49-51 MG Generic drug:  sacubitril-valsartan TAKE (1) TABLET BY MOUTH TWICE DAILY.   montelukast 10 MG tablet Commonly known as:  SINGULAIR TAKE 1 TABLET BY MOUTH DAILY.   multivitamin with minerals Tabs tablet Take 1 tablet by mouth daily.   OXYGEN Inhale 3 L into the lungs continuous.   pantoprazole 40 MG tablet Commonly known as:  PROTONIX TAKE ONE TABLET BY MOUTH DAILY. What changed:  See the new instructions.   polyethylene glycol packet Commonly known as:  MIRALAX / GLYCOLAX Take 17 g by mouth daily as needed for mild constipation. Mix in 8 oz liquid and drink   predniSONE 20 MG tablet Commonly known as:  DELTASONE Take 1 tablet (20 mg total) by mouth daily with breakfast.   SPIRIVA HANDIHALER 18 MCG inhalation capsule Generic drug:  tiotropium INHALE 1 CAPSULE ONCE DAILY AS DIRECTED   spironolactone 25 MG tablet Commonly known as:  ALDACTONE Take 0.5 tablets (12.5 mg total) by mouth daily.   SYMBICORT 160-4.5 MCG/ACT inhaler Generic drug:  budesonide-formoterol INHALE 2 PUFFS FIRST THING IN MORNING AND THEN ANOTHER 2 PUFFS ABOUT 12 HOURS LATER.   TYLENOL PO Take 2 tablets by mouth every 6 (six) hours as needed (pain/headache).            Durable Medical Equipment        Start     Ordered   02/25/17 1436  For home use only DME oxygen  Once    Question Answer Comment  Mode or (Route) Nasal cannula   Liters per Minute 6   Frequency Continuous (stationary and portable oxygen unit needed)   Oxygen delivery system Gas      02/25/17 1436      Day of Discharge BP 126/78 (BP Location: Left Arm)   Pulse 79   Temp 97.6 F (36.4 C) (Oral)   Resp 14   Ht 5' 6"  (1.676 m)   Wt 59 kg (130 lb)   SpO2 94%   BMI 20.98 kg/m   Physical  Exam: General: No acute respiratory distress Lungs: Clear to auscultation bilaterally without wheezes or crackles Cardiovascular: Regular rate and rhythm without murmur gallop or rub normal S1 and S2 Abdomen: Nontender, nondistended, soft, bowel sounds positive, no rebound, no ascites, no appreciable mass Extremities: No significant cyanosis, clubbing, or edema bilateral lower extremities  Basic Metabolic Panel:  Recent Labs Lab 02/21/17 1200 02/22/17 0227 02/24/17 0417 02/26/17 0209  NA 141 139 140 138  K 4.4 4.5 4.9 4.6  CL 106 105 109 105  CO2 25 24 26 29   GLUCOSE 100* 136* 129* 118*  BUN 21* 29* 32* 31*  CREATININE 1.32* 1.60* 1.22 1.20  CALCIUM 9.7 9.3 8.7* 8.7*    Liver Function Tests:  Recent Labs Lab 02/24/17 0417  AST 23  ALT 29  ALKPHOS 45  BILITOT 0.5  PROT 4.5*  ALBUMIN 2.4*   CBC:  Recent Labs Lab 02/21/17 1200 02/22/17 0227 02/24/17 0417  WBC 15.5* 12.4* 11.8*  HGB 16.7 14.5 13.3  HCT 50.1 42.9 40.7  MCV 101.0* 103.6* 104.1*  PLT 269 218 205    BNP (last 3 results)  Recent Labs  08/06/16 1109 02/21/17 1200 02/21/17 1551  BNP 54.7 74.8 77.0    Recent Results (from the past 240 hour(s))  MRSA PCR Screening     Status: None   Collection Time: 02/21/17  4:01 PM  Result Value Ref Range Status   MRSA by PCR NEGATIVE NEGATIVE Final    Comment:        The GeneXpert MRSA Assay (FDA approved for NASAL specimens only), is one component of a comprehensive MRSA colonization surveillance program. It is not intended to diagnose MRSA infection nor to guide or monitor treatment for MRSA infections.      Time spent in discharge (includes decision making & examination of pt): 35 minutes  02/26/2017, 10:46 AM   Cherene Altes, MD [facility] Office  [phone] Pager [phone]  On-Call/Text Page:      [email or web address]      password [facility]

## 2017-02-26 NOTE — Progress Notes (Signed)
Discharge instructions reviewed with pt. Pt has no questions at this time.Pt and wife have no questions at this time. [facility] dropped of portable 02 tanks for pt to use on the way home. Case manager made aware that pt was being discharged. Pt's wife stated they would not want home health pt. IV d/c.

## 2017-02-26 NOTE — Discharge Instructions (Signed)
Chronic Obstructive Pulmonary Disease  Chronic obstructive pulmonary disease (COPD) is a common lung condition in which airflow from the lungs is limited. COPD is a general term that can be used to describe many different lung problems that limit airflow, including both chronic bronchitis and emphysema. If you have COPD, your lung function will probably never return to normal, but there are measures you can take to improve lung function and make yourself feel better. What are the causes?  Smoking (common).  Exposure to secondhand smoke.  Genetic problems.  Chronic inflammatory lung diseases or recurrent infections. What are the signs or symptoms?  Shortness of breath, especially with physical activity.  Deep, persistent (chronic) cough with a large amount of thick mucus.  Wheezing.  Rapid breaths (tachypnea).  Gray or bluish discoloration (cyanosis) of the skin, especially in your fingers, toes, or lips.  Fatigue.  Weight loss.  Frequent infections or episodes when breathing symptoms become much worse (exacerbations).  Chest tightness. How is this diagnosed? Your health care provider will take a medical history and perform a physical examination to diagnose COPD. Additional tests for COPD may include:  Lung (pulmonary) function tests.  Chest X-ray.  CT scan.  Blood tests.  How is this treated? Treatment for COPD may include:  Inhaler and nebulizer medicines. These help manage the symptoms of COPD and make your breathing more comfortable.  Supplemental oxygen. Supplemental oxygen is only helpful if you have a low oxygen level in your blood.  Exercise and physical activity. These are beneficial for nearly all people with COPD.  Lung surgery or transplant.  Nutrition therapy to gain weight, if you are underweight.  Pulmonary rehabilitation. This may involve working with a team of health care providers and specialists, such as respiratory, occupational, and physical  therapists.  Follow these instructions at home:  Take all medicines (inhaled or pills) as directed by your health care provider.  Avoid over-the-counter medicines or cough syrups that dry up your airway (such as antihistamines) and slow down the elimination of secretions unless instructed otherwise by your health care provider.  If you are a smoker, the most important thing that you can do is stop smoking. Continuing to smoke will cause further lung damage and breathing trouble. Ask your health care provider for help with quitting smoking. He or she can direct you to community resources or hospitals that provide support.  Avoid exposure to irritants such as smoke, chemicals, and fumes that aggravate your breathing.  Use oxygen therapy and pulmonary rehabilitation if directed by your health care provider. If you require home oxygen therapy, ask your health care provider whether you should purchase a pulse oximeter to measure your oxygen level at home.  Avoid contact with individuals who have a contagious illness.  Avoid extreme temperature and humidity changes.  Eat healthy foods. Eating smaller, more frequent meals and resting before meals may help you maintain your strength.  Stay active, but balance activity with periods of rest. Exercise and physical activity will help you maintain your ability to do things you want to do.  Preventing infection and hospitalization is very important when you have COPD. Make sure to receive all the vaccines your health care provider recommends, especially the pneumococcal and influenza vaccines. Ask your health care provider whether you need a pneumonia vaccine.  Learn and use relaxation techniques to manage stress.  Learn and use controlled breathing techniques as directed by your health care provider. Controlled breathing techniques include: 1. Pursed lip breathing. Start by  breathing in (inhaling) through your nose for 1 second. Then, purse your lips  as if you were going to whistle and breathe out (exhale) through the pursed lips for 2 seconds. 2. Diaphragmatic breathing. Start by putting one hand on your abdomen just above your waist. Inhale slowly through your nose. The hand on your abdomen should move out. Then purse your lips and exhale slowly. You should be able to feel the hand on your abdomen moving in as you exhale.  Learn and use controlled coughing to clear mucus from your lungs. Controlled coughing is a series of short, progressive coughs. The steps of controlled coughing are: 1. Lean your head slightly forward. 2. Breathe in deeply using diaphragmatic breathing. 3. Try to hold your breath for 3 seconds. 4. Keep your mouth slightly open while coughing twice. 5. Spit any mucus out into a tissue. 6. Rest and repeat the steps once or twice as needed. Contact a health care provider if:  You are coughing up more mucus than usual.  There is a change in the color or thickness of your mucus.  Your breathing is more labored than usual.  Your breathing is faster than usual. Get help right away if:  You have shortness of breath while you are resting.  You have shortness of breath that prevents you from: ? Being able to talk. ? Performing your usual physical activities.  You have chest pain lasting longer than 5 minutes.  Your skin color is more cyanotic than usual.  You measure low oxygen saturations for longer than 5 minutes with a pulse oximeter. This information is not intended to replace advice given to you by your health care provider. Make sure you discuss any questions you have with your health care provider. Document Released: 05/19/2005 Document Revised: 01/15/2016 Document Reviewed: 04/05/2013 Elsevier Interactive Patient Education  2017 [employer]   Bronchiectasis  Bronchiectasis is a condition in which the airways (bronchi) are damaged and widened. This makes it difficult for the lungs to get rid of mucus.  As a result, mucus gathers in the airways, and this often leads to lung infections. Infection can cause inflammation in the airways, which may further weaken and damage the bronchi. What are the causes? Bronchiectasis may be present at birth (congenital) or may develop later in life. Sometimes there is no apparent cause. Some common causes include:  Cystic fibrosis.  Recurrent lung infections (such as pneumonia, tuberculosis, or fungal infections).  Foreign bodies or other blockages in the lungs.  Breathing in fluid, food, or other foreign objects (aspiration).  What are the signs or symptoms? Common symptoms include:  A daily cough that brings up mucus and lasts for more than 3 weeks.  Frequent lung infections (such as pneumonia, tuberculosis, or fungal infections).  Shortness of breath and wheezing.  Weakness and fatigue.  How is this diagnosed? Various tests may be done to help diagnose bronchiectasis. Tests may include:  Chest X-rays or CT scans.  Breathing tests to help determine how your lungs are working.  Sputum cultures to check for infection.  Blood tests and other tests to check for related diseases or causes, such as cystic fibrosis.  How is this treated? Treatment varies depending on the severity of the condition. Medicines may be given to loosen the mucus to be coughed up (expectorants), to relax the muscles of the air passages (bronchodilators), or to prevent or treat infections (antibiotics). Physical therapy methods may be recommended to help clear mucus from the lungs. For  severe cases, surgery may be done to remove the affected part of the lung. Follow these instructions at home:  Get plenty of rest.  Only take over-the-counter or prescription medicines as directed by your health care provider. If antibiotic medicines were prescribed, take them as directed. Finish them even if you start to feel better.  Avoid sedatives and antihistamines unless otherwise  directed by your health care provider. These medicines tend to thicken the mucus in the lungs.  Perform any breathing exercises or techniques to clear the lungs as directed by your health care provider.  Consider using a cold steam vaporizer or humidifier in your room or home to help loosen secretions.  If the cough is worse at night, try sleeping in a semi-upright position in a recliner or using a couple of pillows.  Avoid cigarette smoke and lung irritants. If you smoke, quit.  Stay inside when pollution and ozone levels are high.  Stay current with vaccinations and immunizations.  Follow up with your health care provider as directed. Contact a health care provider if:  You cough up more thick, discolored mucus (sputum) that is yellow to green in color.  You have a fever or persistent symptoms for more than 2-3 days.  You cannot control your cough and are losing sleep. Get help right away if:  You cough up blood.  You have chest pain or increasing shortness of breath.  You have pain that is getting worse or is uncontrolled with medicines.  You have a fever and your symptoms suddenly get worse. This information is not intended to replace advice given to you by your health care provider. Make sure you discuss any questions you have with your health care provider. Document Released: 06/06/2007 Document Revised: 01/21/2016 Document Reviewed: 02/14/2013 Elsevier Interactive Patient Education  2017 [employer].

## 2017-02-26 NOTE — Progress Notes (Signed)
Prescription given to pt.

## 2017-02-28 ENCOUNTER — Telehealth: Payer: Self-pay | Admitting: Internal Medicine

## 2017-02-28 NOTE — Telephone Encounter (Signed)
Per Magda Paganini, we can use one a 15 min slot. Spoke with pt. He has been scheduled to see MW on 03/01/17 at 3pm. Nothing further was needed.

## 2017-03-01 ENCOUNTER — Encounter (HOSPITAL_COMMUNITY): Payer: Self-pay

## 2017-03-01 ENCOUNTER — Encounter: Payer: Self-pay | Admitting: Internal Medicine

## 2017-03-01 ENCOUNTER — Ambulatory Visit (INDEPENDENT_AMBULATORY_CARE_PROVIDER_SITE_OTHER): Payer: Medicare Other | Admitting: Internal Medicine

## 2017-03-01 VITALS — BP 120/60 | HR 83 | Ht 66.0 in | Wt 128.6 lb

## 2017-03-01 DIAGNOSIS — R490 Dysphonia: Secondary | ICD-10-CM

## 2017-03-01 DIAGNOSIS — J9611 Chronic respiratory failure with hypoxia: Secondary | ICD-10-CM

## 2017-03-01 DIAGNOSIS — J449 Chronic obstructive pulmonary disease, unspecified: Secondary | ICD-10-CM

## 2017-03-01 DIAGNOSIS — R918 Other nonspecific abnormal finding of lung field: Secondary | ICD-10-CM

## 2017-03-01 NOTE — Patient Instructions (Addendum)
You may begin to worsen as you taper the prednisone and if so we many need to stop the amiodarone and if so we'll need to see you again asap with all medications in hand    Please schedule a follow up office visit in 6 weeks with full pfts and cxr , call sooner if needed with all medications /inhalers/ solutions in hand so we can verify exactly what you are taking. This includes all medications from all doctors and over the counters

## 2017-03-01 NOTE — Progress Notes (Addendum)
Subjective:    Patient ID: Bruce Travis, male    DOB: [birth year]    MRN: [id number]  Brief patient profile:   73 yowm   quit smoking 1990 with GOLD II  COPD,  MPNs,  remote laryngeal cancer with some dysphagia, allergic rhinitis, and esophageal reflux    HPI 11/08/2011 [name]/ f/u ov on maint rx with symbicort and spiriva c/o nasal symptoms worse but  breathing fine, no limits as long as paces himself and no cough.  Main nasal issue is congestion/itching/sneezing worse x 2 weeks with purulent bloody secretions s HA/toothache  But ahs noted  year round nasal symptoms and worse x 2 years which do not correlate with breathing status Stopped nasonex due to insurance > no worse off it> has not f/u with [name] yet for ent re-eval No daytime saba need rec Augmentin 875 mg twice daily x 10days Prednisone 10 mg take  4 each am x 2 days,   2 each am x 2 days,  1 each am x2days and stop See Dr [name] in 2 weeks to let him direct you further on what to do about your chronic sinus symptoms      03/06/2013 f/u ov/[name] re GOLD II COPD/ chronic hoarse p surg ([facility])/ maint symb/ spiriva Chief Complaint  Patient presents with  . Follow-up    Pt states breathing is the same since last visit, no better or worse. Denies any new co's today.    main issue is yardwork in heat, bettter ex tol p uses saba, hfa good techniqe, never uses neb  rec Only use your albuterol (proaire) as a rescue medication Please see patient coordinator before you leave today  to schedule CT chest to compare to previous studies for your lung nodules> no change x 4 years so directed f/u indicated     Date of Admission: 02/21/2017 Date of Discharge: 02/26/2017   Tests Needing Follow-up: -f/u imaging of posterior LLL is suggested in ~61month  -monitoring of renal function is suggested   Discharge Diagnoses:  Acute on chronic hypoxic respiratory failure Acute COPD exacerbation Bronchiectasis 4 x 3.5 x 2 cm confluent peripheral  opacity within the posterior LLL Chronic systolic CHF - PVC cardiomyopathy  HTN Acute kidney injury on CKD stage III Severe malnutrition in context of chronic illness Macrocytosis GERD  Initial presentation: 89yoM w/ a Hx Chronic Hypoxic Respiratory failure on3 L nasal cannula at home, COPD, Chronic Systolic CHF, Pulmonary HTN, HTN, CAD w/ MI, CKD, GERD, Celiac disease, and Laryngeal CA who presented to the ED w/ acute on chronic respiratory failure with hypoxia.  Hospital Course:  Acute on chronic hypoxic respiratory failure - acute COPD exacerbation - bronchiectasis Cont anti-pseudomonal abx coverage for a planned 7 day course (added to outpt abx to complete an approx 10-14 day course) - will need outpt Pulm f/u - doing well w/ no acute sob or cp at time of d/c   4 x 3.5 x 2 cm confluent peripheral opacity within the posterior LLL suggestive of atelectasis but Radiology suggests f/u imaging in 3 months     Chronic systolic CHF - PVC cardiomyopathy  EF 55% via TTE August 2017 - no clinical evidence of significant volume overload during this hospital stay - w/ improved renal fxn resumed Entresto prior to d/c      FAutoliv  02/23/17 0555 02/25/17 0350 02/26/17 0500  Weight: 56.8 kg (125 lb 4.8 oz) 59.3 kg (130 lb 11.2 oz) 59 kg (130  lb)    HTN BP well controlled at time of d/c   Acute kidney injury on CKD stage III Baseline creatinine approximately 1.1 - creatinine has improved during hospital stay - follow w/ resumption of Entresto  LastLabs   Recent Labs Lab 02/21/17 1200 02/22/17 0227 02/24/17 0417 02/26/17 0209  CREATININE 1.32* 1.60* 1.22 1.20     Severe malnutrition in context of chronic illness  Macrocytosis V-61 and folic acid levels normal   GERD Well controlled       Allergies as of 02/26/2017      Reactions   Codeine Other (See Comments)   Thought head was going to blow off/ severe headache   Adhesive [tape] Other (See  Comments)   Band-aids - tears skin off   Gluten Meal Other (See Comments)   Celiac disease                     Medication List           TAKE these medications          albuterol 108 (90 Base) MCG/ACT inhaler Commonly known as:  PROAIR HFA INHALE 2 PUFFS EVERY 4 HOURS AS NEEDED FOR SHORTNESS OF BREATH. What changed:  how much to take  how to take this  when to take this  reasons to take this  additional instructions   amiodarone 200 MG tablet Commonly known as:  PACERONE Take 0.5 tablets (100 mg total) by mouth daily.   aspirin EC 81 MG tablet Take 81 mg by mouth daily.   azelastine 0.1 % nasal spray Commonly known as:  ASTELIN PLACE 2 SPRAYS INTO EACH NOSTRILS TWICE DAILY AS DIRECTED. What changed:  See the new instructions.   CALTRATE 600+D 600-400 MG-UNIT tablet Generic drug:  Calcium Carbonate-Vitamin D Take 1 tablet by mouth daily.   ciprofloxacin 500 MG tablet Commonly known as:  CIPRO Take 1 tablet (500 mg total) by mouth 2 (two) times daily.   ENTRESTO 49-51 MG Generic drug:  sacubitril-valsartan TAKE (1) TABLET BY MOUTH TWICE DAILY.   montelukast 10 MG tablet Commonly known as:  SINGULAIR TAKE 1 TABLET BY MOUTH DAILY.   multivitamin with minerals Tabs tablet Take 1 tablet by mouth daily.   OXYGEN Inhale 3 L into the lungs continuous.   pantoprazole 40 MG tablet Commonly known as:  PROTONIX TAKE ONE TABLET BY MOUTH DAILY. What changed:  See the new instructions.   polyethylene glycol packet Commonly known as:  MIRALAX / GLYCOLAX Take 17 g by mouth daily as needed for mild constipation. Mix in 8 oz liquid and drink   predniSONE 20 MG tablet Commonly known as:  DELTASONE Take 1 tablet (20 mg total) by mouth daily with breakfast.   SPIRIVA HANDIHALER 18 MCG inhalation capsule Generic drug:  tiotropium INHALE 1 CAPSULE ONCE DAILY AS DIRECTED   spironolactone 25 MG tablet Commonly known as:  ALDACTONE Take 0.5  tablets (12.5 mg total) by mouth daily.   SYMBICORT 160-4.5 MCG/ACT inhaler Generic drug:  budesonide-formoterol INHALE 2 PUFFS FIRST THING IN MORNING AND THEN ANOTHER 2 PUFFS ABOUT 12 HOURS LATER.   TYLENOL PO Take 2 tablets by mouth every 6 (six) hours as needed (pain/headache).                03/01/2017  f/u ov/[name] re: chronic resp failure/ prev gold II criteria/ on symb/spiriva dpi/ 02 3lpm and 8 with activity  Chief Complaint  Patient presents with  . Follow-up  Pt had pneumonia and was treated with anitbiotics. Went to PCP for a follow-up and could not hear any movement in airway so sent pt to the hospital.  Pt was in the hospital Monday July 2 through Saturday July 7 when he was discharged. DME: [employer], 3L at rest. Pt states that at times he has to go up to 8 to 10L on exertion. Pt is very SOB, productive cough with yellow to brown mucus.   On 02 x 2lpm hs x several years  But now  24/7 and up to 3lpm since hosp  Did pulmonary rehab and at his  best fall 2017 able to do aisles at [facility] putting 02 in cart and lowes also on 3lpm  Presently on prednisone 20 mg  On a flat surface 3lpm does ok room to room   No obvious day to day or daytime variability or assoc excess/ purulent sputum or mucus plugs or hemoptysis or cp or chest tightness, subjective wheeze or overt sinus or hb symptoms. No unusual exp hx or h/o childhood pna/ asthma or knowledge of premature birth.  Sleeping ok without nocturnal  or early am exacerbation  of respiratory  c/o's or need for noct saba. Also denies any obvious fluctuation of symptoms with weather or environmental changes or other aggravating or alleviating factors except as outlined above   Current Medications, Allergies, Complete Past Medical History, Past Surgical History, Family History, and Social History were reviewed in [employer] record.  ROS  The following are not active complaints unless bolded sore throat,  dysphagia, dental problems, itching, sneezing,  nasal congestion or excess/ purulent secretions, ear ache,   fever, chills, sweats, unintended wt loss, classically pleuritic or exertional cp,  orthopnea pnd or leg swelling, presyncope, palpitations, abdominal pain, anorexia, nausea, vomiting, diarrhea  or change in bowel or bladder habits, change in stools or urine, dysuria,hematuria,  rash, arthralgias, visual complaints, headache, numbness, weakness or ataxia or problems with walking or coordination,  change in mood/affect or memory.              Objective:   Physical Exam  Hoarse amb w/c bound very chronically ill appearing  wm nad  Vital signs reviewed - Note on arrival 02 sats  94% on 3lpm     Wt  140 04/08/2011 > 06/08/2011 141 >136 09/09/2011 > 141 11/08/2011 > 03/07/2012  139 > 09/07/2012  141 > 136 03/06/2013 > 03/01/2017   128   HEENT: nl dentition, turbinates bilaterally, and oropharynx. Nl external ear canals without cough reflex   NECK :  without JVD/Nodes/TM/ nl carotid upstrokes bilaterally   LUNGS: no acc muscle use,  Nl contour chest with diffuse insp and exp rhonchi bilaterally -no localized wheeze    CV:  RRR  no s3 or murmur or increase in P2, and no edema   ABD:  soft and nontender with nl inspiratory excursion in the supine position. No bruits or organomegaly appreciated, bowel sounds nl  MS:  Nl gait/ ext warm without deformities, calf tenderness, cyanosis or clubbing No obvious joint restrictions   SKIN: warm and dry without lesions    NEURO:  alert, approp, nl sensorium with  no motor or cerebellar deficits apparent. '       I personally reviewed images and agree with radiology impression as follows:   Chest CT w/o contrast 02/22/17 Bilateral lower lobe bronchiectasis with bilateral lower lobe tree-in-bud opacities compatible with infection. Mucous fills multiple bilateral lower lobe bronchi.  4 x 3.5 x 2 cm confluent peripheral opacity within the  posterior left lower lobe is suggestive of atelectasis. However, follow-up CT in 3-6 months recommended.  Labs ordered/ reviewed:      Chemistry      Component Value Date/Time   NA 138 02/26/2017 0209   K 4.6 02/26/2017 0209   CL 105 02/26/2017 0209   CO2 29 02/26/2017 0209   BUN 31 (H) 02/26/2017 0209   CREATININE 1.20 02/26/2017 0209   CREATININE 1.11 02/11/2017 1042      Component Value Date/Time   CALCIUM 8.7 (L) 02/26/2017 0209   ALKPHOS 45 02/24/2017 0417   AST 23 02/24/2017 0417   ALT 29 02/24/2017 0417   BILITOT 0.5 02/24/2017 0417        Lab Results  Component Value Date   WBC 11.8 (H) 02/24/2017   HGB 13.3 02/24/2017   HCT 40.7 02/24/2017   MCV 104.1 (H) 02/24/2017   PLT 205 02/24/2017       Lab Results  Component Value Date   TSH 4.982 (H) 11/03/2016              Assessment & Plan:

## 2017-03-02 DIAGNOSIS — J9611 Chronic respiratory failure with hypoxia: Secondary | ICD-10-CM | POA: Insufficient documentation

## 2017-03-02 NOTE — Assessment & Plan Note (Signed)
-   PFT's 06/08/11  FEV1  1.71 (67%) ratio 37 and no better p B2 with DLC0 54%  Adequate control on present rx, reviewed in detail with pt > no change in rx needed  For now but due to hoarseness will consider change to respimat on return   see avs for instructions unique to this ov

## 2017-03-02 NOTE — Assessment & Plan Note (Signed)
Referred back to Dr [name] 04/02/11 ST MBS swallow eval 04/09/11 >MBS per ST said no obvious aspiration but pt at high risk w/ mod. Dysphagia  According to ST note she went over results w/ him and gave him  Aspiration precautions w/ whole meds in applesause, sit upright,  No straws, small bites/sips, swalow 2-3 times after each bite.   Suspect dysphagia related to throat ca  may be related to the bronchiectasis from chronic aspiration

## 2017-03-02 NOTE — Assessment & Plan Note (Signed)
Adequate control on present rx, reviewed in detail with pt > no change in rx needed  = 3lpm baseline and titrate up if needed > consider trial off amiodarone if worsens

## 2017-03-02 NOTE — Assessment & Plan Note (Addendum)
>  CT 2010 no changes  >CT chest 03/12/2011 >>Severe centrilobular emphysema with areas of new and persistent  nodularity  A new somewhat spiculated opacity within the left upper lobe  measures 1.6 x 2.5 cm on transverse image 13 of series 6 > CT chest 03/07/2012 1. The vast majority of the previously noted pulmonary nodules are unchanged in size, number and distribution (and the overall appearance of the lungs could suggest sequelae of a pneumoconiosis or simply sequelae of old granulomatous disease), with exception of the right lower lobe pulmonary nodules as mentioned above. The largest of these new nodules is 7 mm and has spiculated margins and some surrounding architectural distortion.  - Repeat ct rec 03/06/2013 > No changes so no dedicated f/u  -  Chest CT w/o contrast 02/22/17 Bilateral lower lobe bronchiectasis with bilateral lower lobe tree-in-bud opacities compatible with infection. Mucous fills multiple bilateral lower lobe bronchi. 4 x 3.5 x 2 cm confluent peripheral opacity within the posterior left lower lobe is suggestive of atelectasis. However, follow-up CT in 3-6 months recommended.   This pattern is typical of obstructive bronchiectasis but note the chronic hypoxemic RF is disproportionate to these findings and suggests he may have a separate issue which has improved somewhat on prednisone eg boop or Amiodarone related lung dz and may flare as the pred is tapered and if so will need to consider trial off amiodarone prior to any further w/u ie fob/bx which he is not likely to tolerate well at all   Total time devoted to counseling  > 50 % of initial 60 min office visit:  review case with pt/wife to included extensive inpt/ outpt records in EPIC/  discussion of options/alternatives/ personally creating written customized instructions  in presence of pt  then going over those specific  Instructions directly with the pt including how to use all of the meds but in particular covering  each new medication in detail and the difference between the maintenance= "automatic" meds and the prns using an action plan format for the latter (If this problem/symptom => do that organization reading Left to right).  Please see AVS from this visit for a full list of these instructions which I personally wrote for this pt and  are unique to this visit.   Needs to return with all meds in hand using a trust but verify approach to confirm accurate Medication  Reconciliation The principal here is that until we are certain that the  patients are doing what we've asked, it makes no sense to ask them to do more.

## 2017-03-03 ENCOUNTER — Other Ambulatory Visit (HOSPITAL_COMMUNITY): Payer: Self-pay | Admitting: Internal Medicine

## 2017-03-03 ENCOUNTER — Encounter (HOSPITAL_COMMUNITY): Admission: RE | Admit: 2017-03-03 | Payer: Self-pay | Source: Ambulatory Visit

## 2017-03-04 ENCOUNTER — Ambulatory Visit (INDEPENDENT_AMBULATORY_CARE_PROVIDER_SITE_OTHER): Payer: Medicare Other | Admitting: Family Medicine

## 2017-03-04 ENCOUNTER — Encounter: Payer: Self-pay | Admitting: Family Medicine

## 2017-03-04 VITALS — BP 130/68 | HR 80 | Temp 97.8°F | Resp 22 | Ht 66.5 in | Wt 125.0 lb

## 2017-03-04 DIAGNOSIS — J449 Chronic obstructive pulmonary disease, unspecified: Secondary | ICD-10-CM | POA: Diagnosis not present

## 2017-03-04 DIAGNOSIS — J471 Bronchiectasis with (acute) exacerbation: Secondary | ICD-10-CM

## 2017-03-04 DIAGNOSIS — Z09 Encounter for follow-up examination after completed treatment for conditions other than malignant neoplasm: Secondary | ICD-10-CM

## 2017-03-04 NOTE — Progress Notes (Signed)
Subjective:    Patient ID: Bruce Travis, male    DOB: 01/09/1935, 82 y.o.   MRN: [id number]  HPI  01/27/17 Over the last 2 days, the patient has had a worsening cough productive of blood-tinged sputum. He has a history of throat cancer and saw his ENT physician yesterday where examination revealed no recurrence of the cancer or explanation for the blood-tinged sputum. He does report worsening cough and increasing shortness of breath over the last few days. Has a history of chronic respiratory failure and oxygen dependency due to COPD. Patient exacerbated some and decompensates rapidly usually requiring hospitalization. Today overall he appears at his baseline. His exam is at his baseline.  At that time, my plan was: Given his past history, I will obtain a chest x-ray to evaluate for any obvious malignancy. Cover the patient for possible upper respiratory infection given his hemoptysis and shortness of breath with doxycycline 100 mg by mouth twice a day for 10 days and a prednisone taper pack to cover for COPD exacerbation. Temporarily discontinue aspirin. Recheck next week. If hemoptysis persists, proceed with a chest CT and possible bronchoscopy if worsening. If resolving spontaneously and chest x-ray is clear no further workup will be necessary at that time  02/04/17 Chest x-ray suggested left hilar infiltrate and left upper lobe infiltrate. Patient was treated with antibiotics for CAP as well as prednisone for community-acquired pneumonia as well as COPD exacerbation. The hemoptysis gradually improved and has resolved over the last week. Patient's breathing is back to his baseline.  At that time, my plan was: Hemoptysis did linger for approximately 1 week and is just now improving. Given his previous history of malignancy, his long smoking history, and his lack of any true symptoms of pneumonia, I will obtain a CT scan of the chest to rule out lung cancer. Pain for the patient is gradually starting to  improve  02/11/17 2 days ago, the patient shortness of breath became worse. He continues to have wheezing and shortness of breath. He denies hemoptysis. He does report some mild substernal discomfort. Past medical history is significant for systolic heart failure with an ejection fraction of 25% last year. After starting Entresto, ejection fraction had improved to 50-55% in August. He denies any orthopnea. He denies any pitting edema. His weight is stable. There is no evidence of fluid overload on exam. Catheterization last year revealed only a 25% blockage in the LAD but no significant coronary lesion. He has not been evaluated for anemia in quite some time. However his exam today appears to demonstrate a COPD exacerbation as he has diminished breath sounds, exp wheezing, and accessory muscle use.  At that time, my plan was: Given the fact that he has increased sputum production with white sputum, increasing shortness of breath, and increasing wheezing with increased cough, I believe he is developing COPD exacerbation likely due to the fact that I weaned him off the prednisone too quickly. Begin prednisone 40 mg a day. Recheck the patient in one week and slowly wean him off the prednisone. Given the increased sputum production and change in consistency, I'll also add doxycycline 100 mg by mouth twice a day for 10 days. Reassess the patient in one week or sooner if worse. I will also check a CBC to rule out anemia. I see no evidence of congestive heart failure today on his exam  02/21/17 Patient states that he is not doing any better. Actually, he is doing worse. After walking into  my office, he was saturating 87% on oxygen. It improved only minimally after increasing his oxygen to 5 L. On exam, he has markedly diminished breath sounds bilaterally. There are faint scattered expiratory wheezes but very little in the way of any breath sounds. He has tachypnea and accessory muscle use.  He has prolonged expiratory  phase he is mildly tachycardic on exam.  He reports dyspnea on exertion with very little activity.  That has now increased to dyspnea at rest.  At that time, my plan was: Despite the fact the patient's been on prednisone 20 mg a day for the last 7 days in addition to doxycycline, he is hypoxic on his normal supplemental oxygen. (was supposed to be on 40 mg a day but has been taking one pill of the 20 mg a day) Patient was given a DuoNeb 1 in the office at 10:15. He was also given 80 mg of Depo-Medrol IM 1 at 10:15.  We will reassess the patient in 10 minutes to see his response.   There are 2 issues. First issue is the acute hypoxia/respiratory failure which I believe is due to a COPD exacerbation. He appears to need high-dose steroids, frequent nebulizer treatments, and antibiotics to treat the acute issue. The question is whether this can be accomplished at home or does he require hospitalization.   The second issue is following up his recent hemoptysis and pneumonia with a CT scan that has been scheduled for this Friday.  That will have to be on hold until the first issue has been addressed.  I also question if some of the dyspnea may be related to amiodarone.  He is on spiriva, symbicort, and prednisone 20 mg a day and doing worse.     10:38 Still hypoxic at 89% on 5 L O2 after receiving duoneb and depomedrol.  Patient appears to have COPD exacerbation. I believe he needs high-dose steroids, nebulizer treatments every 2-4 hours, and antibiotic coverage until improving. Given his advanced age, his cardiovascular complications, I do not believe he can tolerate outpatient treatment and is failing outpatient treatment. Recommend ER evaluation.  If patient fails to respond to treatment for COPD exacerbation and imaging of the chest shows no other obvious source, would consider amiodarone as a possible cause of his dyspnea.    03/04/17 Patient was admitted to the hospital.  I have copied relevant portions  of the discharge summary and included them below for reference:  Date of Admission: 02/21/2017 Date of Discharge: 02/26/2017  Attending Physician:MCCLUNG,JEFFREY T  Patient's RKY:[id number], Cammie Mcgee, MD  Tests Needing Follow-up: -f/u imaging of posterior LLL is suggested in ~85month  -monitoring of renal function is suggested   Discharge Diagnoses:  Acute on chronic hypoxic respiratory failure Acute COPD exacerbation Bronchiectasis 4 x 3.5 x 2 cm confluent peripheral opacity within the posterior LLL Chronic systolic CHF - PVC cardiomyopathy  HTN Acute kidney injury on CKD stage III Severe malnutrition in context of chronic illness Macrocytosis GERD  Initial presentation: 89yoM w/ a Hx Chronic Hypoxic Respiratory failure on3 L nasal cannula at home, COPD, Chronic Systolic CHF, Pulmonary HTN, HTN, CAD w/ MI, CKD, GERD, Celiac disease, and Laryngeal CA who presented to the ED w/ acute on chronic respiratory failure with hypoxia.  Hospital Course:  Acute on chronic hypoxic respiratory failure - acute COPD exacerbation - bronchiectasis Cont anti-pseudomonal abx coverage for a planned 7 day course (added to outpt abx to complete an approx 10-14 day course) - will need outpt  Pulm f/u - doing well w/ no acute sob or cp at time of d/c   4 x 3.5 x 2 cm confluent peripheral opacity within the posterior LLL suggestive of atelectasis but Radiology suggests f/u imaging in 3 months would be wise   Chronic systolic CHF - PVC cardiomyopathy  EF 55% via TTE August 2017 - no clinical evidence of significant volume overload during this hospital stay - w/ improved renal fxn resumed Entresto prior to d/c  amiodarone 200 MG tablet Commonly known as:  PACERONE Take 0.5 tablets (100 mg total) by mouth daily.   aspirin EC 81 MG tablet Take 81 mg by mouth daily.   azelastine 0.1 % nasal spray Commonly known as:  ASTELIN PLACE 2 SPRAYS INTO EACH NOSTRILS TWICE DAILY AS DIRECTED. What  changed:  See the new instructions.   CALTRATE 600+D 600-400 MG-UNIT tablet Generic drug:  Calcium Carbonate-Vitamin D Take 1 tablet by mouth daily.   ciprofloxacin 500 MG tablet Commonly known as:  CIPRO Take 1 tablet (500 mg total) by mouth 2 (two) times daily.   ENTRESTO 49-51 MG Generic drug:  sacubitril-valsartan TAKE (1) TABLET BY MOUTH TWICE DAILY.   montelukast 10 MG tablet Commonly known as:  SINGULAIR TAKE 1 TABLET BY MOUTH DAILY.   multivitamin with minerals Tabs tablet Take 1 tablet by mouth daily.   OXYGEN Inhale 3 L into the lungs continuous.   pantoprazole 40 MG tablet Commonly known as:  PROTONIX TAKE ONE TABLET BY MOUTH DAILY. What changed:  See the new instructions.   polyethylene glycol packet Commonly known as:  MIRALAX / GLYCOLAX Take 17 g by mouth daily as needed for mild constipation. Mix in 8 oz liquid and drink   predniSONE 20 MG tablet Commonly known as:  DELTASONE Take 1 tablet (20 mg total) by mouth daily with breakfast.   SPIRIVA HANDIHALER 18 MCG inhalation capsule Generic drug:  tiotropium INHALE 1 CAPSULE ONCE DAILY AS DIRECTED   spironolactone 25 MG tablet Commonly known as:  ALDACTONE Take 0.5 tablets (12.5 mg total) by mouth daily.   SYMBICORT 160-4.5 MCG/ACT inhaler Generic drug:  budesonide-formoterol INHALE 2 PUFFS FIRST THING IN MORNING AND THEN ANOTHER 2 PUFFS ABOUT 12 HOURS LATER.    He is currently on prednisone 20 mg a day for the next 2 weeks. He has completed Cipro.  He states that this is the best his lungs have felt in quite some time. He is still oxygen dependent. On examination today he has diminished breath sounds bilaterally, faint exp wheezes, and faint left posterior basilar crackles consistent with the atelectasis seen on CT scan. CT scan also shows bilateral bronchiectasis with tree bud opacities.   Past Medical History:  Diagnosis Date  . Allergic rhinitis   . Bronchiectasis   . CAD (coronary  artery disease)   . Celiac disease   . Chronic heart failure ([place])   . Chronic kidney disease (CKD), stage III (moderate)   . COPD (chronic obstructive pulmonary disease) ([place])   . Diverticulosis of colon (without mention of hemorrhage)   . Esophageal reflux   . Family history of adverse reaction to anesthesia    daughter gets PONV  . Family hx of colon cancer   . History of stomach ulcers 1980s  . Hyperlipemia   . Hypertension   . 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 Myocardial infarction [facility])    "previous MI/echo in 12/2015"  . Nephrolithiasis    "  got them now; never had OR/scopes" (02/03/2016)  . On home oxygen therapy    "3L; 24/7" (02/21/2017)  . Other diseases of lung, not elsewhere classified   . Personal history of colonic polyps 04/26/2007   hyperplastic   . Pneumonia "several times"   Past Surgical History:  Procedure Laterality Date  . CARDIAC CATHETERIZATION  02/03/2016  . CARDIAC CATHETERIZATION  09/30/2009   non-obstructive CAD w/30% narrowing in prox LAD (Dr. Waunita Schooner)  . CARDIAC CATHETERIZATION  05/04/2001   same at 2011 cath (Dr. Waunita Schooner)  . CARDIAC CATHETERIZATION N/A 02/03/2016   Procedure: Right/Left Heart Cath and Coronary Angiography;  Surgeon: Pixie Casino, MD;  Location: [facility];  Service: Cardiovascular;  Laterality: N/A;  . EPIGLOTOPLASTY W/ MLB     removal due to carcinoma  . EXCISIONAL HEMORRHOIDECTOMY  2000s  . HAND SURGERY Right 1958   d/t crush injury  . TONSILLECTOMY AND ADENOIDECTOMY    . ULTRASOUND GUIDANCE FOR VASCULAR ACCESS  02/03/2016   Procedure: Ultrasound Guidance For Vascular Access;  Surgeon: Pixie Casino, MD;  Location: [facility];  Service: Cardiovascular;;  . VASECTOMY     Current Outpatient Prescriptions on File Prior to Visit  Medication Sig Dispense Refill  . albuterol (PROAIR HFA) 108 (90 Base) MCG/ACT inhaler INHALE 2 PUFFS EVERY 4 HOURS AS NEEDED FOR SHORTNESS OF BREATH.  (Patient taking differently: Inhale 2 puffs into the lungs every 4 (four) hours as needed for shortness of breath. ) 2 Inhaler 5  . amiodarone (PACERONE) 200 MG tablet Take 0.5 tablets (100 mg total) by mouth daily. 15 tablet 11  . aspirin EC 81 MG tablet Take 81 mg by mouth daily.    Marland Kitchen azelastine (ASTELIN) 0.1 % nasal spray PLACE 2 SPRAYS INTO EACH NOSTRILS TWICE DAILY AS DIRECTED. (Patient taking differently: PLACE 2 SPRAYS INTO EACH NOSTRILS TWICE DAILY AS NEEDED FOR CONGESTION) 30 mL 0  . Calcium Carbonate-Vitamin D (CALTRATE 600+D) 600-400 MG-UNIT tablet Take 1 tablet by mouth daily.    Marland Kitchen ENTRESTO 49-51 MG TAKE (1) TABLET BY MOUTH TWICE DAILY. 60 tablet 6  . guaiFENesin (MUCINEX) 600 MG 12 hr tablet Take 600 mg by mouth 2 (two) times daily.    . montelukast (SINGULAIR) 10 MG tablet TAKE 1 TABLET BY MOUTH DAILY. 90 tablet 0  . Multiple Vitamin (MULTIVITAMIN WITH MINERALS) TABS tablet Take 1 tablet by mouth daily.    . Multiple Vitamins-Minerals (CENTRUM SILVER 50+MEN) TABS Take 1 tablet by mouth daily.    . OXYGEN Inhale 3 L into the lungs continuous.    . pantoprazole (PROTONIX) 40 MG tablet TAKE ONE TABLET BY MOUTH DAILY. (Patient taking differently: TAKE ONE TABLET BY MOUTH EVERY OTHER DAY) 90 tablet 3  . polyethylene glycol (MIRALAX / GLYCOLAX) packet Take 17 g by mouth daily as needed for mild constipation. Mix in 8 oz liquid and drink    . predniSONE (DELTASONE) 20 MG tablet Take 1 tablet (20 mg total) by mouth daily with breakfast. 30 tablet 0  . SPIRIVA HANDIHALER 18 MCG inhalation capsule INHALE 1 CAPSULE ONCE DAILY AS DIRECTED 30 capsule 11  . spironolactone (ALDACTONE) 25 MG tablet Take 0.5 tablets (12.5 mg total) by mouth daily. 16 tablet 6  . SYMBICORT 160-4.5 MCG/ACT inhaler INHALE 2 PUFFS FIRST THING IN MORNING AND THEN ANOTHER 2 PUFFS ABOUT 12 HOURS LATER. 10.2 g 11   No current facility-administered medications on file prior to visit.    Allergies  Allergen Reactions  .  Codeine Other (See Comments)    Thought head was going to blow off/ severe headache  . Adhesive [Tape] Other (See Comments)    Band-aids - tears skin off  . Gluten Meal Other (See Comments)    Celiac disease   Social History   Social History  . Marital status: Married    Spouse name: N/A  . Number of children: 3  . Years of education: N/A   Occupational History  . retired Retired    Psychologist, sport and exercise and Administrator   Social History Main Topics  . Smoking status: Former Smoker    Packs/day: 2.00    Years: 35.00    Types: Cigarettes    Quit date: 01/21/1989  . Smokeless tobacco: Former Systems developer    Types: Chew    Quit date: 08/23/2001  . Alcohol use No  . Drug use: No  . Sexual activity: Not on file   Other Topics Concern  . Not on file   Social History Narrative  . No narrative on file      Review of Systems  All other systems reviewed and are negative.      Objective:   Physical Exam  HENT:  Right Ear: Tympanic membrane and ear canal normal.  Left Ear: Tympanic membrane and ear canal normal.  Nose: No mucosal edema or rhinorrhea.  Cardiovascular: Normal rate, regular rhythm and normal heart sounds.   No murmur heard. Pulmonary/Chest: No accessory muscle usage. No tachypnea. He has decreased breath sounds. He has no wheezes. He has no rhonchi. He has no rales. He exhibits no tenderness.  Vitals reviewed.        Assessment & Plan:   Hospital discharge follow-up  Chronic obstructive pulmonary disease, unspecified COPD type ([place])  Bronchiectasis with acute exacerbation ([place])  Patient is doing much better. However I believe he is responding more steroids than 2 antibiotics for the bronchiectasis. His previous exacerbations have also seemed to be more responsive to steroids and antibiotic coverage. Therefore I believe he may be treating pulmonary toxicity secondary to amiodarone in addition to his underlying COPD and bronchiectasis. Therefore I will touch base with his  cardiologist to determine if we can discontinue amiodarone prior to him discontinuing his prednisone.  Meanwhile continue the Symbicort and Spiriva. Continue oxygen. Continue deep breathing exercises and aggressive pulmonary toilet. If the determination is made that he needs to continue the amiodarone, another possibility would be monthly treatment with macrolide abx as a preventative strategy which I would obviously defer to his pulmonologist.

## 2017-03-07 ENCOUNTER — Telehealth (HOSPITAL_COMMUNITY): Payer: Self-pay | Admitting: *Deleted

## 2017-03-08 ENCOUNTER — Encounter (HOSPITAL_COMMUNITY)
Admission: RE | Admit: 2017-03-08 | Discharge: 2017-03-08 | Disposition: A | Discharge status: Home / Self Care | Payer: Self-pay | Source: Ambulatory Visit | Attending: Family Medicine | Admitting: Family Medicine

## 2017-03-10 ENCOUNTER — Encounter (HOSPITAL_COMMUNITY)
Admission: RE | Admit: 2017-03-10 | Discharge: 2017-03-10 | Disposition: A | Discharge status: Home / Self Care | Payer: Self-pay | Source: Ambulatory Visit | Attending: Internal Medicine | Admitting: Internal Medicine

## 2017-03-15 ENCOUNTER — Encounter (HOSPITAL_COMMUNITY): Payer: Self-pay

## 2017-03-17 ENCOUNTER — Encounter (HOSPITAL_COMMUNITY)
Admission: RE | Admit: 2017-03-17 | Discharge: 2017-03-17 | Disposition: A | Discharge status: Home / Self Care | Payer: Self-pay | Source: Ambulatory Visit | Attending: Internal Medicine | Admitting: Internal Medicine

## 2017-03-22 ENCOUNTER — Encounter (HOSPITAL_COMMUNITY)
Admission: RE | Admit: 2017-03-22 | Discharge: 2017-03-22 | Disposition: A | Discharge status: Home / Self Care | Payer: Self-pay | Source: Ambulatory Visit | Attending: Internal Medicine | Admitting: Internal Medicine

## 2017-03-23 NOTE — Progress Notes (Signed)
Devonn started the Pulmonary Maintenance Program on 03/08/17. He is exercising on 6L of O2. His oxygen saturations are 89-94% during exercise.Unnamed is tolerating exercise with fatigue and shortness of breath. Both are slowing improving.

## 2017-03-24 ENCOUNTER — Encounter (HOSPITAL_COMMUNITY)
Admission: RE | Admit: 2017-03-24 | Discharge: 2017-03-24 | Disposition: A | Discharge status: Home / Self Care | Payer: Self-pay | Source: Ambulatory Visit | Attending: Internal Medicine | Admitting: Internal Medicine

## 2017-03-24 DIAGNOSIS — I5021 Acute systolic (congestive) heart failure: Secondary | ICD-10-CM | POA: Insufficient documentation

## 2017-03-29 ENCOUNTER — Encounter (HOSPITAL_COMMUNITY)
Admission: RE | Admit: 2017-03-29 | Discharge: 2017-03-29 | Disposition: A | Discharge status: Home / Self Care | Payer: Self-pay | Source: Ambulatory Visit | Attending: Internal Medicine | Admitting: Internal Medicine

## 2017-03-31 ENCOUNTER — Encounter (HOSPITAL_COMMUNITY)
Admission: RE | Admit: 2017-03-31 | Discharge: 2017-03-31 | Disposition: A | Discharge status: Home / Self Care | Payer: Self-pay | Source: Ambulatory Visit | Attending: Internal Medicine | Admitting: Internal Medicine

## 2017-04-01 ENCOUNTER — Other Ambulatory Visit: Payer: Self-pay | Admitting: Family Medicine

## 2017-04-01 DIAGNOSIS — Z125 Encounter for screening for malignant neoplasm of prostate: Secondary | ICD-10-CM

## 2017-04-01 DIAGNOSIS — I509 Heart failure, unspecified: Secondary | ICD-10-CM

## 2017-04-01 DIAGNOSIS — I2510 Atherosclerotic heart disease of native coronary artery without angina pectoris: Secondary | ICD-10-CM

## 2017-04-01 DIAGNOSIS — I10 Essential (primary) hypertension: Secondary | ICD-10-CM

## 2017-04-01 DIAGNOSIS — Z79899 Other long term (current) drug therapy: Secondary | ICD-10-CM

## 2017-04-01 DIAGNOSIS — Z0000 Encounter for general adult medical examination without abnormal findings: Secondary | ICD-10-CM

## 2017-04-01 DIAGNOSIS — N183 Chronic kidney disease, stage 3 unspecified: Secondary | ICD-10-CM

## 2017-04-01 DIAGNOSIS — E785 Hyperlipidemia, unspecified: Secondary | ICD-10-CM

## 2017-04-04 ENCOUNTER — Other Ambulatory Visit: Payer: Medicare Other

## 2017-04-04 DIAGNOSIS — I10 Essential (primary) hypertension: Secondary | ICD-10-CM

## 2017-04-04 DIAGNOSIS — N183 Chronic kidney disease, stage 3 unspecified: Secondary | ICD-10-CM

## 2017-04-04 DIAGNOSIS — Z125 Encounter for screening for malignant neoplasm of prostate: Secondary | ICD-10-CM

## 2017-04-04 DIAGNOSIS — Z0000 Encounter for general adult medical examination without abnormal findings: Secondary | ICD-10-CM

## 2017-04-04 DIAGNOSIS — I509 Heart failure, unspecified: Secondary | ICD-10-CM

## 2017-04-04 DIAGNOSIS — I2510 Atherosclerotic heart disease of native coronary artery without angina pectoris: Secondary | ICD-10-CM

## 2017-04-04 DIAGNOSIS — Z79899 Other long term (current) drug therapy: Secondary | ICD-10-CM

## 2017-04-04 DIAGNOSIS — E785 Hyperlipidemia, unspecified: Secondary | ICD-10-CM

## 2017-04-04 LAB — CBC WITH DIFFERENTIAL/PLATELET
BASOS ABS: 0 {cells}/uL (ref 0–200)
BASOS PCT: 0 %
EOS ABS: 90 {cells}/uL (ref 15–500)
EOS PCT: 1 %
HCT: 46.5 % (ref 38.5–50.0)
Hemoglobin: 15.4 g/dL (ref 13.0–17.0)
LYMPHS ABS: 1890 {cells}/uL (ref 850–3900)
LYMPHS PCT: 21 %
MCH: 33.3 pg — ABNORMAL HIGH (ref 27.0–33.0)
MCHC: 33.1 g/dL (ref 32.0–36.0)
MCV: 100.6 fL — ABNORMAL HIGH (ref 80.0–100.0)
MONO ABS: 720 {cells}/uL (ref 200–950)
MONOS PCT: 8 %
MPV: 9.9 fL (ref 7.5–12.5)
NEUTROS PCT: 70 %
Neutro Abs: 6300 cells/uL (ref 1500–7800)
PLATELETS: 224 10*3/uL (ref 140–400)
RBC: 4.62 MIL/uL (ref 4.20–5.80)
RDW: 15.7 % — AB (ref 11.0–15.0)
WBC: 9 10*3/uL (ref 3.8–10.8)

## 2017-04-04 LAB — COMPLETE METABOLIC PANEL WITH GFR
ALT: 15 U/L (ref 9–46)
AST: 14 U/L (ref 10–35)
Albumin: 3.6 g/dL (ref 3.6–5.1)
Alkaline Phosphatase: 59 U/L (ref 40–115)
BUN: 22 mg/dL (ref 7–25)
CHLORIDE: 107 mmol/L (ref 98–110)
CO2: 23 mmol/L (ref 20–32)
CREATININE: 1.14 mg/dL — AB (ref 0.70–1.11)
Calcium: 9.1 mg/dL (ref 8.6–10.3)
GFR, Est African American: 70 mL/min (ref >=60)
GFR, Est Non African American: 60 mL/min (ref >=60)
GLUCOSE: 84 mg/dL (ref 70–99)
Potassium: 4.5 mmol/L (ref 3.5–5.3)
Sodium: 142 mmol/L (ref 135–146)
TOTAL PROTEIN: 5.6 g/dL — AB (ref 6.1–8.1)
Total Bilirubin: 0.8 mg/dL (ref 0.2–1.2)

## 2017-04-04 LAB — LIPID PANEL
CHOL/HDL RATIO: 4.6 ratio (ref <5.0)
CHOLESTEROL: 194 mg/dL (ref <200)
HDL: 42 mg/dL (ref >40)
LDL Cholesterol: 128 mg/dL — ABNORMAL HIGH (ref <100)
Triglycerides: 120 mg/dL (ref <150)
VLDL: 24 mg/dL (ref <30)

## 2017-04-05 ENCOUNTER — Encounter (HOSPITAL_COMMUNITY)
Admission: RE | Admit: 2017-04-05 | Discharge: 2017-04-05 | Disposition: A | Discharge status: Home / Self Care | Payer: Self-pay | Source: Ambulatory Visit | Attending: Internal Medicine | Admitting: Internal Medicine

## 2017-04-05 LAB — PSA: PSA: 1 ng/mL (ref <=4.0)

## 2017-04-07 ENCOUNTER — Encounter (HOSPITAL_COMMUNITY)
Admission: RE | Admit: 2017-04-07 | Discharge: 2017-04-07 | Disposition: A | Discharge status: Home / Self Care | Payer: Self-pay | Source: Ambulatory Visit | Attending: Internal Medicine | Admitting: Internal Medicine

## 2017-04-08 ENCOUNTER — Encounter: Payer: Self-pay | Admitting: Family Medicine

## 2017-04-08 ENCOUNTER — Ambulatory Visit (INDEPENDENT_AMBULATORY_CARE_PROVIDER_SITE_OTHER): Payer: Medicare Other | Admitting: Family Medicine

## 2017-04-08 ENCOUNTER — Other Ambulatory Visit: Payer: Self-pay | Admitting: Family Medicine

## 2017-04-08 VITALS — BP 138/64 | HR 66 | Temp 96.3°F | Resp 18 | Ht 66.5 in | Wt 126.0 lb

## 2017-04-08 DIAGNOSIS — N183 Chronic kidney disease, stage 3 unspecified: Secondary | ICD-10-CM

## 2017-04-08 DIAGNOSIS — J438 Other emphysema: Secondary | ICD-10-CM

## 2017-04-08 DIAGNOSIS — I255 Ischemic cardiomyopathy: Secondary | ICD-10-CM

## 2017-04-08 DIAGNOSIS — Z0000 Encounter for general adult medical examination without abnormal findings: Secondary | ICD-10-CM | POA: Diagnosis not present

## 2017-04-08 DIAGNOSIS — I493 Ventricular premature depolarization: Secondary | ICD-10-CM | POA: Diagnosis not present

## 2017-04-08 NOTE — Progress Notes (Signed)
Subjective:    Patient ID: Bruce Travis, male    DOB: 21-Jul-1935, 82 y.o.   MRN: [id number]  HPI  Patient is here for CPE.  He has CHF due to frequent PVCs. He was found to have an ejection fraction of 20-25%. Cardiac catheterization revealed minimal coronary artery disease that was noncontributory. It appears that it was attributed to his frequent PVCs. This is why he takes amiodarone. After his most recent hospitalization for copd exacerbation secondary to bronchiectasis, I spoke with his cardiologist (Dr. Haroldine Laws) regarding discontinuing  Amiodarone but he felt that would be too risky given his improvement in EF after reduction in PVC's on amiodarone.   Immunizations are up-to-date as evidenced below: Immunization History  Administered Date(s) Administered  . H1N1 07/30/2008  . Influenza Split 05/24/2011  . Influenza Whole 06/23/2009, 06/02/2010, 05/23/2012  . Influenza,inj,Quad PF,36+ Mos 05/11/2013, 05/21/2014, 05/12/2015, 05/19/2016  . Pneumococcal Conjugate-13 08/14/2013  . Pneumococcal Polysaccharide-23 10/28/2014  . Td 03/20/2009  . Tdap 04/17/2012    Appointment on 04/04/2017  Component Date Value Ref Range Status  . PSA 04/04/2017 1.0  <=4.0 ng/mL Final   Comment:   The total PSA value from this assay system is standardized against the WHO standard. The test result will be approximately 20% lower when compared to the equimolar-standardized total PSA (Beckman Coulter). Comparison of serial PSA results should be interpreted with this fact in mind.   This test was performed using the Siemens chemiluminescent method. Values obtained from different assay methods cannot be used interchangeably. PSA levels, regardless of value, should not be interpreted as absolute evidence of the presence or absence of disease.     . WBC 04/04/2017 9.0  3.8 - 10.8 K/uL Final  . RBC 04/04/2017 4.62  4.20 - 5.80 MIL/uL Final  . Hemoglobin 04/04/2017 15.4  13.0 - 17.0 g/dL Final  . HCT  04/04/2017 46.5  38.5 - 50.0 % Final  . MCV 04/04/2017 100.6* 80.0 - 100.0 fL Final  . MCH 04/04/2017 33.3* 27.0 - 33.0 pg Final  . MCHC 04/04/2017 33.1  32.0 - 36.0 g/dL Final  . RDW 04/04/2017 15.7* 11.0 - 15.0 % Final  . Platelets 04/04/2017 224  140 - 400 K/uL Final  . MPV 04/04/2017 9.9  7.5 - 12.5 fL Final  . Neutro Abs 04/04/2017 6300  1,500 - 7,800 cells/uL Final  . Lymphs Abs 04/04/2017 1890  850 - 3,900 cells/uL Final  . Monocytes Absolute 04/04/2017 720  200 - 950 cells/uL Final  . Eosinophils Absolute 04/04/2017 90  15 - 500 cells/uL Final  . Basophils Absolute 04/04/2017 0  0 - 200 cells/uL Final  . Neutrophils Relative % 04/04/2017 70  % Final  . Lymphocytes Relative 04/04/2017 21  % Final  . Monocytes Relative 04/04/2017 8  % Final  . Eosinophils Relative 04/04/2017 1  % Final  . Basophils Relative 04/04/2017 0  % Final  . Smear Review 04/04/2017 Criteria for review not met   Final  . Cholesterol 04/04/2017 194  <200 mg/dL Final  . Triglycerides 04/04/2017 120  <150 mg/dL Final  . HDL 04/04/2017 42  >40 mg/dL Final  . Total CHOL/HDL Ratio 04/04/2017 4.6  <5.0 Ratio Final  . VLDL 04/04/2017 24  <30 mg/dL Final  . LDL Cholesterol 04/04/2017 128* <100 mg/dL Final  . Sodium 04/04/2017 142  135 - 146 mmol/L Final  . Potassium 04/04/2017 4.5  3.5 - 5.3 mmol/L Final  . Chloride 04/04/2017 107  98 - 110  mmol/L Final  . CO2 04/04/2017 23  20 - 32 mmol/L Final   Comment: ** Please note change in reference range(s). **     . Glucose, Bld 04/04/2017 84  70 - 99 mg/dL Final  . BUN 04/04/2017 22  7 - 25 mg/dL Final  . Creat 04/04/2017 1.14* 0.70 - 1.11 mg/dL Final   Comment:   For patients > or = 80 years of age: The upper reference limit for Creatinine is approximately 13% higher for people identified as African-American.     . Total Bilirubin 04/04/2017 0.8  0.2 - 1.2 mg/dL Final  . Alkaline Phosphatase 04/04/2017 59  40 - 115 U/L Final  . AST 04/04/2017 14  10 - 35 U/L  Final  . ALT 04/04/2017 15  9 - 46 U/L Final  . Total Protein 04/04/2017 5.6* 6.1 - 8.1 g/dL Final  . Albumin 04/04/2017 3.6  3.6 - 5.1 g/dL Final  . Calcium 04/04/2017 9.1  8.6 - 10.3 mg/dL Final  . GFR, Est African American 04/04/2017 70  >=60 mL/min Final  . GFR, Est Non African American 04/04/2017 60  >=60 mL/min Final   Past Medical History:  Diagnosis Date  . Allergic rhinitis   . Bronchiectasis   . CAD (coronary artery disease)   . Celiac disease   . Chronic heart failure ([place])   . Chronic kidney disease (CKD), stage III (moderate)   . COPD (chronic obstructive pulmonary disease) ([place])   . Diverticulosis of colon (without mention of hemorrhage)   . Esophageal reflux   . Family history of adverse reaction to anesthesia    daughter gets PONV  . Family hx of colon cancer   . History of stomach ulcers 1980s  . Hyperlipemia   . Hypertension   . 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 Myocardial infarction [facility] [place])    "previous MI/echo in 12/2015"  . Nephrolithiasis    "got them now; never had OR/scopes" (02/03/2016)  . On home oxygen therapy    "3L; 24/7" (02/21/2017)  . Other diseases of lung, not elsewhere classified   . Personal history of colonic polyps 04/26/2007   hyperplastic   . Pneumonia "several times"   Past Surgical History:  Procedure Laterality Date  . CARDIAC CATHETERIZATION  02/03/2016  . CARDIAC CATHETERIZATION  09/30/2009   non-obstructive CAD w/30% narrowing in prox LAD (Dr. Waunita Schooner)  . CARDIAC CATHETERIZATION  05/04/2001   same at 2011 cath (Dr. Waunita Schooner)  . CARDIAC CATHETERIZATION N/A 02/03/2016   Procedure: Right/Left Heart Cath and Coronary Angiography;  Surgeon: Pixie Casino, MD;  Location: [facility];  Service: Cardiovascular;  Laterality: N/A;  . EPIGLOTOPLASTY W/ MLB     removal due to carcinoma  . EXCISIONAL HEMORRHOIDECTOMY  2000s  . HAND SURGERY Right 1958   d/t crush injury  . TONSILLECTOMY AND  ADENOIDECTOMY    . ULTRASOUND GUIDANCE FOR VASCULAR ACCESS  02/03/2016   Procedure: Ultrasound Guidance For Vascular Access;  Surgeon: Pixie Casino, MD;  Location: [facility];  Service: Cardiovascular;;  . VASECTOMY     Current Outpatient Prescriptions on File Prior to Visit  Medication Sig Dispense Refill  . albuterol (PROAIR HFA) 108 (90 Base) MCG/ACT inhaler INHALE 2 PUFFS EVERY 4 HOURS AS NEEDED FOR SHORTNESS OF BREATH. (Patient taking differently: Inhale 2 puffs into the lungs every 4 (four) hours as needed for shortness of breath. ) 2 Inhaler 5  .  amiodarone (PACERONE) 200 MG tablet Take 0.5 tablets (100 mg total) by mouth daily. 15 tablet 11  . aspirin EC 81 MG tablet Take 81 mg by mouth daily.    Marland Kitchen azelastine (ASTELIN) 0.1 % nasal spray PLACE 2 SPRAYS INTO EACH NOSTRILS TWICE DAILY AS DIRECTED. (Patient taking differently: PLACE 2 SPRAYS INTO EACH NOSTRILS TWICE DAILY AS NEEDED FOR CONGESTION) 30 mL 0  . Calcium Carbonate-Vitamin D (CALTRATE 600+D) 600-400 MG-UNIT tablet Take 1 tablet by mouth daily.    Marland Kitchen ENTRESTO 49-51 MG TAKE (1) TABLET BY MOUTH TWICE DAILY. 60 tablet 6  . guaiFENesin (MUCINEX) 600 MG 12 hr tablet Take 600 mg by mouth 2 (two) times daily.    . montelukast (SINGULAIR) 10 MG tablet TAKE 1 TABLET BY MOUTH DAILY. 90 tablet 0  . Multiple Vitamin (MULTIVITAMIN WITH MINERALS) TABS tablet Take 1 tablet by mouth daily.    . Multiple Vitamins-Minerals (CENTRUM SILVER 50+MEN) TABS Take 1 tablet by mouth daily.    . OXYGEN Inhale 3 L into the lungs continuous.    . pantoprazole (PROTONIX) 40 MG tablet TAKE ONE TABLET BY MOUTH DAILY. (Patient taking differently: TAKE ONE TABLET BY MOUTH EVERY OTHER DAY) 90 tablet 3  . polyethylene glycol (MIRALAX / GLYCOLAX) packet Take 17 g by mouth daily as needed for mild constipation. Mix in 8 oz liquid and drink    . SPIRIVA HANDIHALER 18 MCG inhalation capsule INHALE 1 CAPSULE ONCE DAILY AS DIRECTED 30 capsule 11  . spironolactone  (ALDACTONE) 25 MG tablet Take 0.5 tablets (12.5 mg total) by mouth daily. 16 tablet 6  . SYMBICORT 160-4.5 MCG/ACT inhaler INHALE 2 PUFFS FIRST THING IN MORNING AND THEN ANOTHER 2 PUFFS ABOUT 12 HOURS LATER. 10.2 g 11   No current facility-administered medications on file prior to visit.    Allergies  Allergen Reactions  . Codeine Other (See Comments)    Thought head was going to blow off/ severe headache  . Adhesive [Tape] Other (See Comments)    Band-aids - tears skin off  . Gluten Meal Other (See Comments)    Celiac disease   Social History   Social History  . Marital status: Married    Spouse name: N/A  . Number of children: 3  . Years of education: N/A   Occupational History  . retired Retired    Psychologist, sport and exercise and Administrator   Social History Main Topics  . Smoking status: Former Smoker    Packs/day: 2.00    Years: 35.00    Types: Cigarettes    Quit date: 01/21/1989  . Smokeless tobacco: Former Systems developer    Types: Chew    Quit date: 08/23/2001  . Alcohol use No  . Drug use: No  . Sexual activity: Not on file   Other Topics Concern  . Not on file   Social History Narrative  . No narrative on file   Family History  Problem Relation Age of Onset  . Heart disease Father   . Colon cancer Father   . Prostate cancer Father   . Stroke Mother   . Endometrial cancer Sister   . Stroke Maternal Grandmother   . Cancer Paternal Grandmother      Review of Systems  All other systems reviewed and are negative.      Objective:   Physical Exam  Constitutional: He is oriented to person, place, and time. He appears well-developed and well-nourished. No distress.  HENT:  Head: Normocephalic and atraumatic.  Right Ear:  External ear normal.  Left Ear: External ear normal.  Nose: Nose normal.  Mouth/Throat: Oropharynx is clear and moist. No oropharyngeal exudate.  Eyes: Pupils are equal, round, and reactive to light. Conjunctivae and EOM are normal. Right eye exhibits no  discharge. Left eye exhibits no discharge. No scleral icterus.  Neck: Normal range of motion. Neck supple. No JVD present. No tracheal deviation present. No thyromegaly present.  Cardiovascular: Normal rate, regular rhythm, normal heart sounds and intact distal pulses.  Exam reveals no gallop and no friction rub.   No murmur heard. Pulmonary/Chest: Effort normal. No stridor. No respiratory distress. He has decreased breath sounds. He has no wheezes. He has no rales. He exhibits no tenderness.  Abdominal: Soft. Bowel sounds are normal. He exhibits no distension and no mass. There is no tenderness. There is no rebound and no guarding.  Musculoskeletal: Normal range of motion. He exhibits no edema or tenderness.  Lymphadenopathy:    He has no cervical adenopathy.  Neurological: He is alert and oriented to person, place, and time. He has normal reflexes. No cranial nerve deficit. He exhibits normal muscle tone. Coordination normal.  Skin: Skin is warm. No rash noted. He is not diaphoretic. No erythema. No pallor.  Psychiatric: He has a normal mood and affect. His behavior is normal. Judgment and thought content normal.  Vitals reviewed.         Assessment & Plan:  General medical exam  Other emphysema ([place])  Cardiomyopathy, ischemic  PVC's (premature ventricular contractions)  Chronic kidney disease (CKD), stage III (moderate)  His immunizations are up-to-date. His lab work is excellent. His LDL cholesterol is slightly elevated. However given his advanced age and the mild coronary disease found on his catheterization, patient elects not to start statin therapy at the present time.  I recommended against a colonoscopy as well as a digital rectal exam for prostate cancer given his advanced age.  PSA was checked automatically by the lab and is no longer indicated. Renal angina stable. He is scheduled to see his pulmonologist on Tuesday for further advice regarding his bronchiectasis and  emphysema. I question if he may benefit from taking a low-dose prednisone indefinitely such as 5 mg a day. I defer to their expert opinion of his pulmonologist. However the patient has a recurrent track record of hospitalization as it seems like every 2-3 months requiring high-dose corticosteroids.

## 2017-04-12 ENCOUNTER — Ambulatory Visit (INDEPENDENT_AMBULATORY_CARE_PROVIDER_SITE_OTHER): Payer: Medicare Other | Admitting: Internal Medicine

## 2017-04-12 ENCOUNTER — Encounter (HOSPITAL_COMMUNITY): Payer: Self-pay

## 2017-04-12 ENCOUNTER — Encounter: Payer: Self-pay | Admitting: Internal Medicine

## 2017-04-12 ENCOUNTER — Other Ambulatory Visit (INDEPENDENT_AMBULATORY_CARE_PROVIDER_SITE_OTHER): Payer: Medicare Other

## 2017-04-12 VITALS — BP 112/60 | HR 75 | Ht 67.0 in | Wt 125.0 lb

## 2017-04-12 DIAGNOSIS — R918 Other nonspecific abnormal finding of lung field: Secondary | ICD-10-CM

## 2017-04-12 DIAGNOSIS — J9611 Chronic respiratory failure with hypoxia: Secondary | ICD-10-CM | POA: Diagnosis not present

## 2017-04-12 DIAGNOSIS — J449 Chronic obstructive pulmonary disease, unspecified: Secondary | ICD-10-CM | POA: Diagnosis not present

## 2017-04-12 LAB — CBC WITH DIFFERENTIAL/PLATELET
BASOS PCT: 0.7 % (ref 0.0–3.0)
Basophils Absolute: 0.1 10*3/uL (ref 0.0–0.1)
EOS PCT: 1.7 % (ref 0.0–5.0)
Eosinophils Absolute: 0.2 10*3/uL (ref 0.0–0.7)
HCT: 45.9 % (ref 39.0–52.0)
Hemoglobin: 15.2 g/dL (ref 13.0–17.0)
LYMPHS ABS: 1.6 10*3/uL (ref 0.7–4.0)
Lymphocytes Relative: 17.5 % (ref 12.0–46.0)
MCHC: 33.2 g/dL (ref 30.0–36.0)
MCV: 101 fl — ABNORMAL HIGH (ref 78.0–100.0)
MONO ABS: 1 10*3/uL (ref 0.1–1.0)
Monocytes Relative: 10.8 % (ref 3.0–12.0)
NEUTROS ABS: 6.2 10*3/uL (ref 1.4–7.7)
NEUTROS PCT: 69.3 % (ref 43.0–77.0)
PLATELETS: 249 10*3/uL (ref 150.0–400.0)
RBC: 4.54 Mil/uL (ref 4.22–5.81)
RDW: 16 % — AB (ref 11.5–15.5)
WBC: 8.9 10*3/uL (ref 4.0–10.5)

## 2017-04-12 LAB — PULMONARY FUNCTION TEST
DL/VA % PRED: 27 %
DL/VA: 1.2 ml/min/mmHg/L
DLCO COR % PRED: 23 %
DLCO COR: 6.57 ml/min/mmHg
DLCO unc % pred: 24 %
DLCO unc: 6.83 ml/min/mmHg
FEF 25-75 POST: 0.71 L/s
FEF 25-75 Pre: 0.66 L/sec
FEF2575-%Change-Post: 8 %
FEF2575-%PRED-PRE: 39 %
FEF2575-%Pred-Post: 42 %
FEV1-%Change-Post: 6 %
FEV1-%Pred-Post: 65 %
FEV1-%Pred-Pre: 61 %
FEV1-POST: 1.62 L
FEV1-Pre: 1.52 L
FEV1FVC-%Change-Post: 2 %
FEV1FVC-%PRED-PRE: 59 %
FEV6-%CHANGE-POST: 2 %
FEV6-%PRED-PRE: 105 %
FEV6-%Pred-Post: 108 %
FEV6-POST: 3.53 L
FEV6-Pre: 3.44 L
FEV6FVC-%Change-Post: -1 %
FEV6FVC-%PRED-POST: 100 %
FEV6FVC-%Pred-Pre: 102 %
FVC-%CHANGE-POST: 4 %
FVC-%PRED-POST: 106 %
FVC-%PRED-PRE: 102 %
FVC-POST: 3.76 L
FVC-PRE: 3.61 L
POST FEV6/FVC RATIO: 94 %
PRE FEV1/FVC RATIO: 42 %
Post FEV1/FVC ratio: 43 %
Pre FEV6/FVC Ratio: 95 %

## 2017-04-12 LAB — SEDIMENTATION RATE: SED RATE: 16 mm/h (ref 0–20)

## 2017-04-12 MED ORDER — TIOTROPIUM BROMIDE MONOHYDRATE 2.5 MCG/ACT IN AERS: 2.0000 | INHALATION_SPRAY | Freq: Every day | RESPIRATORY_TRACT | 11 refills | Status: DC

## 2017-04-12 MED ORDER — TIOTROPIUM BROMIDE MONOHYDRATE 2.5 MCG/ACT IN AERS: 2.0000 | INHALATION_SPRAY | Freq: Every day | RESPIRATORY_TRACT | 0 refills | Status: DC

## 2017-04-12 NOTE — Progress Notes (Signed)
Subjective:    Patient ID: Bruce Travis, male    DOB: 12-06-1934    MRN: [id number]  Brief patient profile:   84 yowm   quit smoking 1990 with GOLD II  COPD,  MPNs,  remote laryngeal cancer with some dysphagia, allergic rhinitis, and esophageal reflux    HPI 11/08/2011 [name]/ f/u ov on maint rx with symbicort and spiriva c/o nasal symptoms worse but  breathing fine, no limits as long as paces himself and no cough.  Main nasal issue is congestion/itching/sneezing worse x 2 weeks with purulent bloody secretions s HA/toothache  But ahs noted  year round nasal symptoms and worse x 2 years which do not correlate with breathing status Stopped nasonex due to insurance > no worse off it> has not f/u with [name] yet for ent re-eval No daytime saba need rec Augmentin 875 mg twice daily x 10days Prednisone 10 mg take  4 each am x 2 days,   2 each am x 2 days,  1 each am x2days and stop See Dr [name] in 2 weeks to let him direct you further on what to do about your chronic sinus symptoms      03/06/2013 f/u ov/[name] re GOLD II COPD/ chronic hoarse p surg ([facility])/ maint symb/ spiriva Chief Complaint  Patient presents with  . Follow-up    Pt states breathing is the same since last visit, no better or worse. Denies any new co's today.    main issue is yardwork in heat, bettter ex tol p uses saba, hfa good techniqe, never uses neb  rec Only use your albuterol (proaire) as a rescue medication Please see patient coordinator before you leave today  to schedule CT chest to compare to previous studies for your lung nodules> no change x 4 years so directed f/u indicated     Date of Admission: 02/21/2017 Date of Discharge: 02/26/2017   Tests Needing Follow-up: -f/u imaging of posterior LLL is suggested in ~47month  -monitoring of renal function is suggested   Discharge Diagnoses:  Acute on chronic hypoxic respiratory failure Acute COPD exacerbation Bronchiectasis 4 x 3.5 x 2 cm confluent peripheral  opacity within the posterior LLL Chronic systolic CHF - PVC cardiomyopathy  HTN Acute kidney injury on CKD stage III Severe malnutrition in context of chronic illness Macrocytosis GERD  Initial presentation: 89yoM w/ a Hx Chronic Hypoxic Respiratory failure on3 L nasal cannula at home, COPD, Chronic Systolic CHF, Pulmonary HTN, HTN, CAD w/ MI, CKD, GERD, Celiac disease, and Laryngeal CA who presented to the ED w/ acute on chronic respiratory failure with hypoxia.  Hospital Course:  Acute on chronic hypoxic respiratory failure - acute COPD exacerbation - bronchiectasis Cont anti-pseudomonal abx coverage for a planned 7 day course (added to outpt abx to complete an approx 10-14 day course) - will need outpt Pulm f/u - doing well w/ no acute sob or cp at time of d/c   4 x 3.5 x 2 cm confluent peripheral opacity within the posterior LLL suggestive of atelectasis but Radiology suggests f/u imaging in 3 months     Chronic systolic CHF - PVC cardiomyopathy  EF 55% via TTE August 2017 - no clinical evidence of significant volume overload during this hospital stay - w/ improved renal fxn resumed Entresto prior to d/c      FAutoliv  02/23/17 0555 02/25/17 0350 02/26/17 0500  Weight: 56.8 kg (125 lb 4.8 oz) 59.3 kg (130 lb 11.2 oz) 59 kg (130  lb)    HTN BP well controlled at time of d/c   Acute kidney injury on CKD stage III Baseline creatinine approximately 1.1 - creatinine has improved during hospital stay - follow w/ resumption of Entresto  LastLabs   Recent Labs Lab 02/21/17 1200 02/22/17 0227 02/24/17 0417 02/26/17 0209  CREATININE 1.32* 1.60* 1.22 1.20     Severe malnutrition in context of chronic illness  Macrocytosis Z-61 and folic acid levels normal   GERD Well controlled       Allergies as of 02/26/2017      Reactions   Codeine Other (See Comments)   Thought head was going to blow off/ severe headache   Adhesive [tape] Other (See  Comments)   Band-aids - tears skin off   Gluten Meal Other (See Comments)   Celiac disease                     Medication List           TAKE these medications          albuterol 108 (90 Base) MCG/ACT inhaler Commonly known as:  PROAIR HFA INHALE 2 PUFFS EVERY 4 HOURS AS NEEDED FOR SHORTNESS OF BREATH. What changed:  how much to take  how to take this  when to take this  reasons to take this  additional instructions   amiodarone 200 MG tablet Commonly known as:  PACERONE Take 0.5 tablets (100 mg total) by mouth daily.   aspirin EC 81 MG tablet Take 81 mg by mouth daily.   azelastine 0.1 % nasal spray Commonly known as:  ASTELIN PLACE 2 SPRAYS INTO EACH NOSTRILS TWICE DAILY AS DIRECTED. What changed:  See the new instructions.   CALTRATE 600+D 600-400 MG-UNIT tablet Generic drug:  Calcium Carbonate-Vitamin D Take 1 tablet by mouth daily.   ciprofloxacin 500 MG tablet Commonly known as:  CIPRO Take 1 tablet (500 mg total) by mouth 2 (two) times daily.   ENTRESTO 49-51 MG Generic drug:  sacubitril-valsartan TAKE (1) TABLET BY MOUTH TWICE DAILY.   montelukast 10 MG tablet Commonly known as:  SINGULAIR TAKE 1 TABLET BY MOUTH DAILY.   multivitamin with minerals Tabs tablet Take 1 tablet by mouth daily.   OXYGEN Inhale 3 L into the lungs continuous.   pantoprazole 40 MG tablet Commonly known as:  PROTONIX TAKE ONE TABLET BY MOUTH DAILY. What changed:  See the new instructions.   polyethylene glycol packet Commonly known as:  MIRALAX / GLYCOLAX Take 17 g by mouth daily as needed for mild constipation. Mix in 8 oz liquid and drink   predniSONE 20 MG tablet Commonly known as:  DELTASONE Take 1 tablet (20 mg total) by mouth daily with breakfast.   SPIRIVA HANDIHALER 18 MCG inhalation capsule Generic drug:  tiotropium INHALE 1 CAPSULE ONCE DAILY AS DIRECTED   spironolactone 25 MG tablet Commonly known as:  ALDACTONE Take 0.5  tablets (12.5 mg total) by mouth daily.   SYMBICORT 160-4.5 MCG/ACT inhaler Generic drug:  budesonide-formoterol INHALE 2 PUFFS FIRST THING IN MORNING AND THEN ANOTHER 2 PUFFS ABOUT 12 HOURS LATER.   TYLENOL PO Take 2 tablets by mouth every 6 (six) hours as needed (pain/headache).                03/01/2017  f/u ov/[name] re: chronic resp failure/ prev gold II criteria/ on symb/spiriva dpi/ 02 3lpm and 8 with activity  Chief Complaint  Patient presents with  . Follow-up  Pt had pneumonia and was treated with anitbiotics. Went to PCP for a follow-up and could not hear any movement in airway so sent pt to the hospital.  Pt was in the hospital Monday July 2 through Saturday July 7 when he was discharged. DME: [employer], 3L at rest. Pt states that at times he has to go up to 8 to 10L on exertion. Pt is very SOB, productive cough with yellow to brown mucus.   On 02 x 2lpm hs x several years  But now  24/7 and up to 3lpm since hosp  Did pulmonary rehab and at his  best fall 2017 able to do aisles at [facility] putting 02 in cart and lowes also on 3lpm  Presently on prednisone 20 mg  On a flat surface 3lpm does ok room to room  rec You may begin to worsen as you taper the prednisone and if so we many need to stop the amiodarone and if so we'll need to see you again asap with all medications in hand  Please schedule a follow up office visit in 6 weeks with full pfts and cxr , call sooner if needed with all medications /inhalers/ solutions in hand so we can verify exactly what you are taking. This includes all medications from all doctors and over the counters     04/12/2017  f/u ov/[name] re:  GOLD II copd / maint symb 160spiriva dpi  Chief Complaint  Patient presents with  . Follow-up    Breathing is much improved since the last visit. He is attending pulmonary rehab 2 x per wk. He is using proair once per wk on average.   On 3lpm most the time/ 6lpm with exertion like at rehab, doing about the  same vs a year prior to OV   Back to doing some walking at [facility] behind a cart with 02 in the cart  Using proair first thing in am ? Why as supposed to use just prn  No worse sob off prednisone   No obvious day to day or daytime variability or assoc excess/ purulent sputum or mucus plugs or hemoptysis or cp or chest tightness, subjective wheeze or overt sinus or hb symptoms. No unusual exp hx or h/o childhood pna/ asthma or knowledge of premature birth.  Sleeping ok without nocturnal  or early am exacerbation  of respiratory  c/o's or need for noct saba. Also denies any obvious fluctuation of symptoms with weather or environmental changes or other aggravating or alleviating factors except as outlined above   Current Medications, Allergies, Complete Past Medical History, Past Surgical History, Family History, and Social History were reviewed in [employer] record.  ROS  The following are not active complaints unless bolded sore throat, dysphagia, dental problems, itching, sneezing,  nasal congestion or excess/ purulent secretions, ear ache,   fever, chills, sweats, unintended wt loss, classically pleuritic or exertional cp,  orthopnea pnd or leg swelling, presyncope, palpitations, abdominal pain, anorexia, nausea, vomiting, diarrhea  or change in bowel or bladder habits, change in stools or urine, dysuria,hematuria,  rash, arthralgias, visual complaints, headache, numbness, weakness or ataxia or problems with walking or coordination,  change in mood/affect or memory.                       Objective:   Physical Exam  Hoarse amb w/c bound very chronically ill appearing  wm nad  Vital signs reviewed - Note on arrival 02 sats  93%  on 3lpm     Wt  140 04/08/2011 > 06/08/2011 141 >136 09/09/2011 > 141 11/08/2011 > 03/07/2012  139 > 09/07/2012  141 > 136 03/06/2013 > 03/01/2017   128 >  04/12/2017  125   HEENT: nl dentition, turbinates bilaterally, and oropharynx. Nl external ear  canals without cough reflex   NECK :  without JVD/Nodes/TM/ nl carotid upstrokes bilaterally   LUNGS: no acc muscle use,  Nl contour chest with scattered mild  insp and exp rhonchi bilaterally -no localized wheeze    CV:  RRR  no s3 or murmur or increase in P2, and no edema   ABD:  soft and nontender with nl inspiratory excursion in the supine position. No bruits or organomegaly appreciated, bowel sounds nl  MS:  Nl gait/ ext warm without deformities, calf tenderness, cyanosis or clubbing No obvious joint restrictions   SKIN: warm and dry without lesions    NEURO:  alert, approp, nl sensorium with  no motor or cerebellar deficits apparent. '       I personally reviewed images and agree with radiology impression as follows:   Chest CT w/o contrast 02/22/17 Bilateral lower lobe bronchiectasis with bilateral lower lobe tree-in-bud opacities compatible with infection. Mucous fills multiple bilateral lower lobe bronchi.  4 x 3.5 x 2 cm confluent peripheral opacity within the posterior left lower lobe is suggestive of atelectasis. However, follow-up CT in 3-6 months recommended.     labs ordered/ reviewed  Lab Results  Component Value Date   ESRSEDRATE 16 04/12/2017   Lab Results  Component Value Date   WBC 8.9 04/12/2017   HGB 15.2 04/12/2017   HCT 45.9 04/12/2017   MCV 101.0 (H) 04/12/2017   PLT 249.0 04/12/2017        Assessment & Plan:

## 2017-04-12 NOTE — Patient Instructions (Addendum)
Try spiriva respimat 2 pffs each am right behind the symbicort  Please schedule a follow up visit in 3 months but call sooner if needed with cxr on return

## 2017-04-12 NOTE — Progress Notes (Signed)
PFT done today. 

## 2017-04-13 NOTE — Assessment & Plan Note (Addendum)
-   PFT's 06/08/11  FEV1  1.71 (67%) ratio 37 and no better p B2 with DLC0 54%   -  PFT's  04/12/2017  FEV1 1.62  (65 % ) ratio 43  p 6 % improvement from saba p spiriva  prior to study with DLCO  24/23 % corrects to 27  % for alv volume   - 04/12/2017  After extensive coaching device effectiveness =    75% so changed to spiriva smi    Group D in terms of symptom/risk and laba/lama/ICS  therefore appropriate rx at this point but would like to consolidate and make all inhalers the same type and have him understand the fundamentals of maint vs prns better  I had an extended discussion with the patient reviewing all relevant studies completed to date and  lasting 15 to 20 minutes of a 25 minute visit    Each maintenance medication was reviewed in detail including most importantly the difference between maintenance and prns and under what circumstances the prns are to be triggered using an action plan format that is not reflected in the computer generated alphabetically organized AVS.    Please see AVS for specific instructions unique to this visit that I personally wrote and verbalized to the the pt in detail and then reviewed with pt  by my nurse highlighting any  changes in therapy recommended at today's visit to their plan of care.

## 2017-04-13 NOTE — Assessment & Plan Note (Signed)
Adequate control on present rx, reviewed in detail with pt > no change in rx needed   = 3lpm 24/7 except up to 6lpm with activity

## 2017-04-13 NOTE — Assessment & Plan Note (Signed)
>  CT 2010 no changes  >CT chest 03/12/2011 >>Severe centrilobular emphysema with areas of new and persistent  nodularity  A new somewhat spiculated opacity within the left upper lobe  measures 1.6 x 2.5 cm on transverse image 13 of series 6 > CT chest 03/07/2012 1. The vast majority of the previously noted pulmonary nodules are unchanged in size, number and distribution (and the overall appearance of the lungs could suggest sequelae of a pneumoconiosis or simply sequelae of old granulomatous disease), with exception of the right lower lobe pulmonary nodules as mentioned above. The largest of these new nodules is 7 mm and has spiculated margins and some surrounding architectural distortion.  - Repeat ct rec 03/06/2013 > No changes so no dedicated f/u  -  Chest CT w/o contrast 02/22/17 Bilateral lower lobe bronchiectasis with bilateral lower lobe tree-in-bud opacities compatible with infection. Mucous fills multiple bilateral lower lobe bronchi. 4 x 3.5 x 2 cm confluent peripheral opacity within the posterior left lower lobe is suggestive of atelectasis. However, follow-up CT in 3-6 months recommended.  No flare of symptoms off prednisone and esr unimpressive but still concerned about this and Dr Parthenia Ames aware and has f/u with pt who has extremely  poor reserve and is at risk of amio toxicity

## 2017-04-13 NOTE — Progress Notes (Signed)
Spoke with pt and notified of results per Dr. [name]. Pt verbalized understanding and denied any questions. 

## 2017-04-14 ENCOUNTER — Ambulatory Visit (INDEPENDENT_AMBULATORY_CARE_PROVIDER_SITE_OTHER): Payer: Medicare Other | Admitting: Physician Assistant

## 2017-04-14 ENCOUNTER — Encounter (HOSPITAL_COMMUNITY)
Admission: RE | Admit: 2017-04-14 | Discharge: 2017-04-14 | Disposition: A | Discharge status: Home / Self Care | Payer: Self-pay | Source: Ambulatory Visit | Attending: Internal Medicine | Admitting: Internal Medicine

## 2017-04-14 ENCOUNTER — Encounter: Payer: Self-pay | Admitting: Physician Assistant

## 2017-04-14 VITALS — BP 122/60 | HR 73 | Temp 97.5°F | Resp 16 | Wt 126.6 lb

## 2017-04-14 DIAGNOSIS — S40812A Abrasion of left upper arm, initial encounter: Secondary | ICD-10-CM

## 2017-04-14 MED ORDER — CEPHALEXIN 500 MG PO CAPS: 500.0000 mg | ORAL_CAPSULE | Freq: Four times a day (QID) | ORAL | 0 refills | Status: DC

## 2017-04-14 NOTE — Progress Notes (Signed)
Patient ID: Bruce Travis MRN: [id number], DOB: [birth year], 82 y.o. Date of Encounter: 04/14/2017, 4:08 PM    Chief Complaint:  Chief Complaint  Patient presents with  . wound on lef arm    x10days      HPI: 82 y.o. year old male presents with above.   He reports that about 10 days ago he banged his left arm on the door. Had abrasion to small area near elbow. Says that "he's been doctoring on it" but  is just afraid it may get infected and isn't healing so came in. No other concerns to address today.     Home Meds:   Outpatient Medications Prior to Visit  Medication Sig Dispense Refill  . albuterol (PROAIR HFA) 108 (90 Base) MCG/ACT inhaler INHALE 2 PUFFS EVERY 4 HOURS AS NEEDED FOR SHORTNESS OF BREATH. (Patient taking differently: Inhale 2 puffs into the lungs every 4 (four) hours as needed for shortness of breath. ) 2 Inhaler 5  . amiodarone (PACERONE) 200 MG tablet Take 0.5 tablets (100 mg total) by mouth daily. 15 tablet 11  . aspirin EC 81 MG tablet Take 81 mg by mouth daily.    Marland Kitchen azelastine (ASTELIN) 0.1 % nasal spray PLACE 2 SPRAYS INTO EACH NOSTRILS TWICE DAILY AS DIRECTED. (Patient taking differently: PLACE 2 SPRAYS INTO EACH NOSTRILS TWICE DAILY AS NEEDED FOR CONGESTION) 30 mL 0  . Calcium Carbonate-Vitamin D (CALTRATE 600+D) 600-400 MG-UNIT tablet Take 1 tablet by mouth daily.    Marland Kitchen dextromethorphan-guaiFENesin (MUCINEX DM) 30-600 MG 12hr tablet Take 1 tablet by mouth 2 (two) times daily.    Marland Kitchen ENTRESTO 49-51 MG TAKE (1) TABLET BY MOUTH TWICE DAILY. 60 tablet 6  . LUTEIN PO Take 1 capsule by mouth daily.    . montelukast (SINGULAIR) 10 MG tablet TAKE 1 TABLET BY MOUTH DAILY. 90 tablet 0  . Multiple Vitamins-Minerals (CENTRUM SILVER 50+MEN) TABS Take 1 tablet by mouth daily.    . OXYGEN Inhale 3 L into the lungs continuous. 3lpm with rest and 6 lpm with exertion    . pantoprazole (PROTONIX) 40 MG tablet TAKE ONE TABLET BY MOUTH DAILY. (Patient taking differently: TAKE ONE  TABLET BY MOUTH EVERY OTHER DAY) 90 tablet 3  . polyethylene glycol (MIRALAX / GLYCOLAX) packet Take 17 g by mouth daily as needed for mild constipation. Mix in 8 oz liquid and drink    . spironolactone (ALDACTONE) 25 MG tablet Take 0.5 tablets (12.5 mg total) by mouth daily. 16 tablet 6  . SYMBICORT 160-4.5 MCG/ACT inhaler INHALE 2 PUFFS FIRST THING IN MORNING AND THEN ANOTHER 2 PUFFS ABOUT 12 HOURS LATER. 10.2 g 11  . Tiotropium Bromide Monohydrate (SPIRIVA RESPIMAT) 2.5 MCG/ACT AERS Inhale 2 puffs into the lungs daily. 1 Inhaler 11   No facility-administered medications prior to visit.     Allergies:  Allergies  Allergen Reactions  . Codeine Other (See Comments)    Thought head was going to blow off/ severe headache  . Adhesive [Tape] Other (See Comments)    Band-aids - tears skin off  . Gluten Meal Other (See Comments)    Celiac disease      Review of Systems: See HPI for pertinent ROS. All other ROS negative.    Physical Exam: Blood pressure 122/60, pulse 73, temperature (!) 97.5 F (36.4 C), temperature source Oral, resp. rate 16, weight 126 lb 9.6 oz (57.4 kg), SpO2 96 %., Body mass index is 19.83 kg/m. General:  [name], elderly man.  Appears in no acute distress. Neck: Supple. No thyromegaly. No lymphadenopathy. Lungs: Distant BS Heart: Regular rhythm. No murmurs, rubs, or gallops. Msk:  Strength and tone normal for age. Extremities/Skin: Left Arm: Anterior lateral aspect near level of elbow---very superficial wound---proximal portion (~1cm diameter)-- just small amount of blood from this area. Distal portion (~1cm diameter) with golden color pasty consistency covering this portion. Neuro: Alert and oriented X 3. Moves all extremities spontaneously. Gait is normal. CNII-XII grossly in tact. Psych:  Responds to questions appropriately with a normal affect.     ASSESSMENT AND PLAN:  82 y.o. year old male with  1. Abrasion of left upper extremity, initial  encounter Antibiotic ointment applied. Nonstick dressing applied. He is to start Keflex immediately and take as directed. Follow up if site worsens or does not completely heal open completion of antibiotic. - cephALEXin (KEFLEX) 500 MG capsule; Take 1 capsule (500 mg total) by mouth 4 (four) times daily.  Dispense: 28 capsule; Refill: 0   Signed, [address] [place], [place], [facility] 04/14/2017 4:08 PM

## 2017-04-19 ENCOUNTER — Encounter (HOSPITAL_COMMUNITY): Payer: Self-pay

## 2017-04-21 ENCOUNTER — Encounter (HOSPITAL_COMMUNITY)
Admission: RE | Admit: 2017-04-21 | Discharge: 2017-04-21 | Disposition: A | Discharge status: Home / Self Care | Payer: Medicare Other | Source: Ambulatory Visit | Attending: Internal Medicine | Admitting: Internal Medicine

## 2017-04-26 ENCOUNTER — Encounter (HOSPITAL_COMMUNITY)
Admission: RE | Admit: 2017-04-26 | Discharge: 2017-04-26 | Disposition: A | Discharge status: Home / Self Care | Payer: Self-pay | Source: Ambulatory Visit | Attending: Internal Medicine | Admitting: Internal Medicine

## 2017-04-26 DIAGNOSIS — I5021 Acute systolic (congestive) heart failure: Secondary | ICD-10-CM | POA: Insufficient documentation

## 2017-04-28 ENCOUNTER — Encounter (HOSPITAL_COMMUNITY)
Admission: RE | Admit: 2017-04-28 | Discharge: 2017-04-28 | Disposition: A | Discharge status: Home / Self Care | Payer: Self-pay | Source: Ambulatory Visit | Attending: Internal Medicine | Admitting: Internal Medicine

## 2017-05-02 ENCOUNTER — Other Ambulatory Visit: Payer: Self-pay | Admitting: Family Medicine

## 2017-05-03 ENCOUNTER — Encounter (HOSPITAL_COMMUNITY)
Admission: RE | Admit: 2017-05-03 | Discharge: 2017-05-03 | Disposition: A | Discharge status: Home / Self Care | Payer: Self-pay | Source: Ambulatory Visit | Attending: Internal Medicine | Admitting: Internal Medicine

## 2017-05-05 ENCOUNTER — Encounter (HOSPITAL_COMMUNITY)
Admission: RE | Admit: 2017-05-05 | Discharge: 2017-05-05 | Disposition: A | Discharge status: Home / Self Care | Payer: Self-pay | Source: Ambulatory Visit | Attending: Internal Medicine | Admitting: Internal Medicine

## 2017-05-10 ENCOUNTER — Encounter (HOSPITAL_COMMUNITY)
Admission: RE | Admit: 2017-05-10 | Discharge: 2017-05-10 | Disposition: A | Discharge status: Home / Self Care | Payer: Self-pay | Source: Ambulatory Visit | Attending: Internal Medicine | Admitting: Internal Medicine

## 2017-05-12 ENCOUNTER — Encounter (HOSPITAL_COMMUNITY)
Admission: RE | Admit: 2017-05-12 | Discharge: 2017-05-12 | Disposition: A | Discharge status: Home / Self Care | Payer: Self-pay | Source: Ambulatory Visit | Attending: Internal Medicine | Admitting: Internal Medicine

## 2017-05-17 ENCOUNTER — Encounter (HOSPITAL_COMMUNITY)
Admission: RE | Admit: 2017-05-17 | Discharge: 2017-05-17 | Disposition: A | Discharge status: Home / Self Care | Payer: Self-pay | Source: Ambulatory Visit | Attending: Internal Medicine | Admitting: Internal Medicine

## 2017-05-19 ENCOUNTER — Encounter (HOSPITAL_COMMUNITY)
Admission: RE | Admit: 2017-05-19 | Discharge: 2017-05-19 | Disposition: A | Discharge status: Home / Self Care | Payer: Medicare Other | Source: Ambulatory Visit | Attending: Internal Medicine | Admitting: Internal Medicine

## 2017-05-20 ENCOUNTER — Other Ambulatory Visit (HOSPITAL_COMMUNITY): Payer: Self-pay | Admitting: Cardiology

## 2017-05-20 MED ORDER — SPIRONOLACTONE 25 MG PO TABS: 12.5000 mg | ORAL_TABLET | Freq: Every day | ORAL | 6 refills | Status: DC

## 2017-05-24 ENCOUNTER — Encounter (HOSPITAL_COMMUNITY)
Admission: RE | Admit: 2017-05-24 | Discharge: 2017-05-24 | Disposition: A | Discharge status: Home / Self Care | Payer: Self-pay | Source: Ambulatory Visit | Attending: Internal Medicine | Admitting: Internal Medicine

## 2017-05-24 ENCOUNTER — Other Ambulatory Visit (HOSPITAL_COMMUNITY): Payer: Self-pay | Admitting: Internal Medicine

## 2017-05-24 DIAGNOSIS — I5021 Acute systolic (congestive) heart failure: Secondary | ICD-10-CM | POA: Insufficient documentation

## 2017-05-26 ENCOUNTER — Encounter (HOSPITAL_COMMUNITY)
Admission: RE | Admit: 2017-05-26 | Discharge: 2017-05-26 | Disposition: A | Discharge status: Home / Self Care | Payer: Self-pay | Source: Ambulatory Visit | Attending: Internal Medicine | Admitting: Internal Medicine

## 2017-05-31 ENCOUNTER — Encounter (HOSPITAL_COMMUNITY): Payer: Self-pay

## 2017-05-31 ENCOUNTER — Ambulatory Visit (INDEPENDENT_AMBULATORY_CARE_PROVIDER_SITE_OTHER): Payer: Medicare Other | Admitting: Family Medicine

## 2017-05-31 VITALS — BP 142/60 | HR 88 | Temp 97.5°F | Wt 127.0 lb

## 2017-05-31 DIAGNOSIS — J441 Chronic obstructive pulmonary disease with (acute) exacerbation: Secondary | ICD-10-CM | POA: Diagnosis not present

## 2017-05-31 MED ORDER — PREDNISONE 20 MG PO TABS: 60.0000 mg | ORAL_TABLET | Freq: Every day | ORAL | 0 refills | Status: DC

## 2017-05-31 MED ORDER — DOXYCYCLINE HYCLATE 100 MG PO TABS: 100.0000 mg | ORAL_TABLET | Freq: Two times a day (BID) | ORAL | 0 refills | Status: DC

## 2017-05-31 NOTE — Progress Notes (Signed)
Subjective:    Patient ID: Bruce Travis, male    DOB: 02-19-1935, 82 y.o.   MRN: [id number]  HPI  Patient reports 2 days of increasing shortness of breath, cough productive of gray and green sputum, increasing chest congestion, and increasing shortness of breath. Pulse oximetry is 91% on 3 L via nasal cannula. Symptoms are consistent with his previous COPD exacerbations. He denies any chest pain. He denies any hemoptysis. He has been using his albuterol every 4-6 hours with minimal relief Past Medical History:  Diagnosis Date  . Allergic rhinitis   . Bronchiectasis   . CAD (coronary artery disease)   . Celiac disease   . Chronic heart failure ([place])   . Chronic kidney disease (CKD), stage III (moderate)   . COPD (chronic obstructive pulmonary disease) ([place])   . Diverticulosis of colon (without mention of hemorrhage)   . Esophageal reflux   . Family history of adverse reaction to anesthesia    daughter gets PONV  . Family hx of colon cancer   . History of stomach ulcers 1980s  . Hyperlipemia   . Hypertension   . 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 Myocardial infarction [facility])    "previous MI/echo in 12/2015"  . Nephrolithiasis    "got them now; never had OR/scopes" (02/03/2016)  . On home oxygen therapy    "3L; 24/7" (02/21/2017)  . Other diseases of lung, not elsewhere classified   . Personal history of colonic polyps 04/26/2007   hyperplastic   . Pneumonia "several times"   Past Surgical History:  Procedure Laterality Date  . CARDIAC CATHETERIZATION  02/03/2016  . CARDIAC CATHETERIZATION  09/30/2009   non-obstructive CAD w/30% narrowing in prox LAD (Dr. Waunita Schooner)  . CARDIAC CATHETERIZATION  05/04/2001   same at 2011 cath (Dr. Waunita Schooner)  . CARDIAC CATHETERIZATION N/A 02/03/2016   Procedure: Right/Left Heart Cath and Coronary Angiography;  Surgeon: Pixie Casino, MD;  Location: [place] [facility];  Service: Cardiovascular;  Laterality: N/A;  .  EPIGLOTOPLASTY W/ MLB     removal due to carcinoma  . EXCISIONAL HEMORRHOIDECTOMY  2000s  . HAND SURGERY Right 1958   d/t crush injury  . TONSILLECTOMY AND ADENOIDECTOMY    . ULTRASOUND GUIDANCE FOR VASCULAR ACCESS  02/03/2016   Procedure: Ultrasound Guidance For Vascular Access;  Surgeon: Pixie Casino, MD;  Location: [place] [facility];  Service: Cardiovascular;;  . VASECTOMY     Current Outpatient Prescriptions on File Prior to Visit  Medication Sig Dispense Refill  . albuterol (PROAIR HFA) 108 (90 Base) MCG/ACT inhaler INHALE 2 PUFFS EVERY 4 HOURS AS NEEDED FOR SHORTNESS OF BREATH. (Patient taking differently: Inhale 2 puffs into the lungs every 4 (four) hours as needed for shortness of breath. ) 2 Inhaler 5  . amiodarone (PACERONE) 200 MG tablet Take 0.5 tablets (100 mg total) by mouth daily. 15 tablet 11  . aspirin EC 81 MG tablet Take 81 mg by mouth daily.    Marland Kitchen azelastine (ASTELIN) 0.1 % nasal spray PLACE 2 SPRAYS INTO EACH NOSTRILS TWICE DAILY AS DIRECTED. (Patient taking differently: PLACE 2 SPRAYS INTO EACH NOSTRILS TWICE DAILY AS NEEDED FOR CONGESTION) 30 mL 0  . Calcium Carbonate-Vitamin D (CALTRATE 600+D) 600-400 MG-UNIT tablet Take 1 tablet by mouth daily.    . cephALEXin (KEFLEX) 500 MG capsule Take 1 capsule (500 mg total) by mouth 4 (four) times daily. 28 capsule 0  . dextromethorphan-guaiFENesin (  MUCINEX DM) 30-600 MG 12hr tablet Take 1 tablet by mouth 2 (two) times daily.    Marland Kitchen ENTRESTO 49-51 MG TAKE (1) TABLET BY MOUTH TWICE DAILY. 60 tablet 11  . LUTEIN PO Take 1 capsule by mouth daily.    . montelukast (SINGULAIR) 10 MG tablet TAKE 1 TABLET BY MOUTH DAILY. 90 tablet 0  . Multiple Vitamins-Minerals (CENTRUM SILVER 50+MEN) TABS Take 1 tablet by mouth daily.    . OXYGEN Inhale 3 L into the lungs continuous. 3lpm with rest and 6 lpm with exertion    . pantoprazole (PROTONIX) 40 MG tablet TAKE ONE TABLET BY MOUTH DAILY. (Patient taking differently: TAKE ONE TABLET BY MOUTH  EVERY OTHER DAY) 90 tablet 3  . polyethylene glycol (MIRALAX / GLYCOLAX) packet Take 17 g by mouth daily as needed for mild constipation. Mix in 8 oz liquid and drink    . spironolactone (ALDACTONE) 25 MG tablet Take 0.5 tablets (12.5 mg total) by mouth daily. 16 tablet 6  . SYMBICORT 160-4.5 MCG/ACT inhaler INHALE 2 PUFFS FIRST THING IN MORNING AND THEN ANOTHER 2 PUFFS ABOUT 12 HOURS LATER. 10.2 g 11  . Tiotropium Bromide Monohydrate (SPIRIVA RESPIMAT) 2.5 MCG/ACT AERS Inhale 2 puffs into the lungs daily. 1 Inhaler 11   No current facility-administered medications on file prior to visit.    Allergies  Allergen Reactions  . Codeine Other (See Comments)    Thought head was going to blow off/ severe headache  . Adhesive [Tape] Other (See Comments)    Band-aids - tears skin off  . Gluten Meal Other (See Comments)    Celiac disease   Social History   Social History  . Marital status: Married    Spouse name: N/A  . Number of children: 3  . Years of education: N/A   Occupational History  . retired Retired    Psychologist, sport and exercise and Administrator   Social History Main Topics  . Smoking status: Former Smoker    Packs/day: 2.00    Years: 35.00    Types: Cigarettes    Quit date: 01/21/1989  . Smokeless tobacco: Former Systems developer    Types: Chew    Quit date: 08/23/2001  . Alcohol use No  . Drug use: No  . Sexual activity: Not on file   Other Topics Concern  . Not on file   Social History Narrative  . No narrative on file      Review of Systems  All other systems reviewed and are negative.      Objective:   Physical Exam  HENT:  Right Ear: Tympanic membrane and ear canal normal.  Left Ear: Tympanic membrane and ear canal normal.  Nose: No mucosal edema or rhinorrhea.  Cardiovascular: Normal rate, regular rhythm and normal heart sounds.   No murmur heard. Pulmonary/Chest: He has decreased breath sounds. He has wheezes. He has no rhonchi. He has no rales. He exhibits no tenderness.  Vitals  reviewed.        Assessment & Plan:  COPD exacerbation ([place]) - Plan: predniSONE (DELTASONE) 20 MG tablet, doxycycline (VIBRA-TABS) 100 MG tablet   Patient is having a COPD exacerbation. Begin prednisone 60 mg a day for 5 days. Reassess on Friday. If doing better at that time, I will start wean the patient back on prednisone gradually. Continue using his albuterol every 4-6 hours as needed. Add doxycycline 100 mg by mouth twice a day for 10 days given the change in his sputum quantity and quality. Go to  the emergency room if worsening.

## 2017-06-02 ENCOUNTER — Encounter (HOSPITAL_COMMUNITY): Payer: Self-pay

## 2017-06-07 ENCOUNTER — Encounter (HOSPITAL_COMMUNITY)
Admission: RE | Admit: 2017-06-07 | Discharge: 2017-06-07 | Disposition: A | Discharge status: Home / Self Care | Payer: Self-pay | Source: Ambulatory Visit | Attending: Internal Medicine | Admitting: Internal Medicine

## 2017-06-09 ENCOUNTER — Encounter (HOSPITAL_COMMUNITY)
Admission: RE | Admit: 2017-06-09 | Discharge: 2017-06-09 | Disposition: A | Discharge status: Home / Self Care | Payer: Self-pay | Source: Ambulatory Visit | Attending: Internal Medicine | Admitting: Internal Medicine

## 2017-06-10 ENCOUNTER — Ambulatory Visit (HOSPITAL_BASED_OUTPATIENT_CLINIC_OR_DEPARTMENT_OTHER)
Admission: RE | Admit: 2017-06-10 | Discharge: 2017-06-10 | Disposition: A | Discharge status: Home / Self Care | Payer: Medicare Other | Source: Ambulatory Visit | Attending: Internal Medicine | Admitting: Internal Medicine

## 2017-06-10 ENCOUNTER — Ambulatory Visit (HOSPITAL_COMMUNITY)
Admission: RE | Admit: 2017-06-10 | Discharge: 2017-06-10 | Disposition: A | Discharge status: Home / Self Care | Payer: Medicare Other | Source: Ambulatory Visit | Attending: Internal Medicine | Admitting: Internal Medicine

## 2017-06-10 ENCOUNTER — Encounter (HOSPITAL_COMMUNITY): Payer: Self-pay | Admitting: Internal Medicine

## 2017-06-10 VITALS — BP 125/79 | HR 68 | Wt 125.8 lb

## 2017-06-10 DIAGNOSIS — Z8049 Family history of malignant neoplasm of other genital organs: Secondary | ICD-10-CM | POA: Insufficient documentation

## 2017-06-10 DIAGNOSIS — K900 Celiac disease: Secondary | ICD-10-CM | POA: Diagnosis not present

## 2017-06-10 DIAGNOSIS — N183 Chronic kidney disease, stage 3 (moderate): Secondary | ICD-10-CM | POA: Insufficient documentation

## 2017-06-10 DIAGNOSIS — I252 Old myocardial infarction: Secondary | ICD-10-CM | POA: Insufficient documentation

## 2017-06-10 DIAGNOSIS — Z823 Family history of stroke: Secondary | ICD-10-CM | POA: Diagnosis not present

## 2017-06-10 DIAGNOSIS — I5042 Chronic combined systolic (congestive) and diastolic (congestive) heart failure: Secondary | ICD-10-CM

## 2017-06-10 DIAGNOSIS — Z8521 Personal history of malignant neoplasm of larynx: Secondary | ICD-10-CM | POA: Diagnosis not present

## 2017-06-10 DIAGNOSIS — J449 Chronic obstructive pulmonary disease, unspecified: Secondary | ICD-10-CM | POA: Diagnosis not present

## 2017-06-10 DIAGNOSIS — Z8711 Personal history of peptic ulcer disease: Secondary | ICD-10-CM | POA: Insufficient documentation

## 2017-06-10 DIAGNOSIS — I493 Ventricular premature depolarization: Secondary | ICD-10-CM | POA: Diagnosis not present

## 2017-06-10 DIAGNOSIS — Z9981 Dependence on supplemental oxygen: Secondary | ICD-10-CM | POA: Diagnosis not present

## 2017-06-10 DIAGNOSIS — Z885 Allergy status to narcotic agent status: Secondary | ICD-10-CM | POA: Diagnosis not present

## 2017-06-10 DIAGNOSIS — Z7951 Long term (current) use of inhaled steroids: Secondary | ICD-10-CM | POA: Insufficient documentation

## 2017-06-10 DIAGNOSIS — Z87891 Personal history of nicotine dependence: Secondary | ICD-10-CM | POA: Diagnosis not present

## 2017-06-10 DIAGNOSIS — I2510 Atherosclerotic heart disease of native coronary artery without angina pectoris: Secondary | ICD-10-CM | POA: Diagnosis not present

## 2017-06-10 DIAGNOSIS — K219 Gastro-esophageal reflux disease without esophagitis: Secondary | ICD-10-CM | POA: Diagnosis not present

## 2017-06-10 DIAGNOSIS — Z800 Family history of malignant neoplasm of digestive organs: Secondary | ICD-10-CM | POA: Diagnosis not present

## 2017-06-10 DIAGNOSIS — Z8701 Personal history of pneumonia (recurrent): Secondary | ICD-10-CM | POA: Diagnosis not present

## 2017-06-10 DIAGNOSIS — Z86010 Personal history of colonic polyps: Secondary | ICD-10-CM | POA: Insufficient documentation

## 2017-06-10 DIAGNOSIS — I4510 Unspecified right bundle-branch block: Secondary | ICD-10-CM | POA: Diagnosis not present

## 2017-06-10 DIAGNOSIS — I5022 Chronic systolic (congestive) heart failure: Secondary | ICD-10-CM | POA: Insufficient documentation

## 2017-06-10 DIAGNOSIS — J9610 Chronic respiratory failure, unspecified whether with hypoxia or hypercapnia: Secondary | ICD-10-CM | POA: Diagnosis not present

## 2017-06-10 DIAGNOSIS — Z8042 Family history of malignant neoplasm of prostate: Secondary | ICD-10-CM | POA: Diagnosis not present

## 2017-06-10 DIAGNOSIS — Z8249 Family history of ischemic heart disease and other diseases of the circulatory system: Secondary | ICD-10-CM | POA: Diagnosis not present

## 2017-06-10 DIAGNOSIS — I130 Hypertensive heart and chronic kidney disease with heart failure and stage 1 through stage 4 chronic kidney disease, or unspecified chronic kidney disease: Secondary | ICD-10-CM | POA: Diagnosis not present

## 2017-06-10 DIAGNOSIS — Z79899 Other long term (current) drug therapy: Secondary | ICD-10-CM | POA: Insufficient documentation

## 2017-06-10 DIAGNOSIS — I428 Other cardiomyopathies: Secondary | ICD-10-CM | POA: Diagnosis not present

## 2017-06-10 DIAGNOSIS — E785 Hyperlipidemia, unspecified: Secondary | ICD-10-CM | POA: Diagnosis not present

## 2017-06-10 DIAGNOSIS — Z7982 Long term (current) use of aspirin: Secondary | ICD-10-CM | POA: Insufficient documentation

## 2017-06-10 LAB — ECHOCARDIOGRAM COMPLETE
CHL CUP RV SYS PRESS: 45 mmHg
FS: 32 % (ref 28–44)
IVS/LV PW RATIO, ED: 0.81
LA vol A4C: 53.6 ml
LA vol index: 30.7 mL/m2
LA vol: 51.3 mL
LADIAMINDEX: 2.1 cm/m2
LASIZE: 35 mm
LDCA: 4.15 cm2
LEFT ATRIUM END SYS DIAM: 35 mm
LV PW d: 13.9 mm — AB (ref 0.6–1.1)
LV TDI E'LATERAL: 8.81
LV dias vol index: 52 mL/m2
LV sys vol: 42 mL (ref 21–61)
LVDIAVOL: 87 mL (ref 62–150)
LVELAT: 8.81 cm/s
LVOT diameter: 23 mm
LVSYSVOLIN: 25 mL/m2
RV LATERAL S' VELOCITY: 15.1 cm/s
Reg peak vel: 324 cm/s
Simpson's disk: 51
Stroke v: 45 ml
TAPSE: 24.8 mm
TDI e' medial: 7.83
TR max vel: 324 cm/s

## 2017-06-10 NOTE — Progress Notes (Signed)
  Echocardiogram 2D Echocardiogram has been performed.  Bruce Travis 06/10/2017, 10:06 AM

## 2017-06-10 NOTE — Progress Notes (Signed)
Patient ID: Bruce Travis, male   DOB: 19-Feb-1935, 82 y.o.   MRN: [id number]   ADVANCED HF CLINIC NOTE  Chief Complaint:  PAC's and PVC's, abnormal ekg, DOE  Primary Care Physician: Susy Frizzle, MD  HPI:  Bruce Travis is a 82 y.o. male with COPD (O2 dependent). HTN, HL and systolic HF fleto to be related to frequent PVCs.   Admitted in June 2017 with acute HF. Echo with EF 25%. Cath twith essentially normal coronaries. EF 20-25%. Low filling pressures but CO 2.5/1.5. Started on milrinone. 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 Started on amiodarone. PVCs went from ~20/minute to 1-2/minute by time of discharge. Milrinone weaned off.   Echo 04/12/16 LVEF 50-55%, Trivial AI, Mild Bruce, Mild LAE, PA peak pressure 46 mm Hg - much improved from previous.   Admitted 12/18 with PNA.   Bruce Travis presents today for regular follow up. Remains on amio 100 daily. Has had several hospitalizations this year for COPD. Has been following with Dr. Melvyn Novas and there has been concern over risk of amio pulmonary toxicity with poor lung reserve. Feels good. Going to Pulmonary rehab, Wears 2L O2 at rest up to 6L with exercise. Doing all ADLs and walking at [place]. Very active. No edema, orthopnea or PND or palpitations.   Echo today EF 50-55% Personally reviewed   ECG: NSR 72 with RBBB and PACs. No PVCs  PMHx:  Past Medical History:  Diagnosis Date  . Allergic rhinitis   . Bronchiectasis   . CAD (coronary artery disease)   . Celiac disease   . Chronic heart failure ([place])   . Chronic kidney disease (CKD), stage III (moderate) ([facility])   . COPD (chronic obstructive pulmonary disease) ([place])   . Diverticulosis of colon (without mention of hemorrhage)   . Esophageal reflux   . Family history of adverse reaction to anesthesia    daughter gets PONV  . Family hx of colon cancer   . History of stomach ulcers 1980s  . Hyperlipemia   . Hypertension   . 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  Myocardial infarction [facility])    "previous MI/echo in 12/2015"  . Nephrolithiasis    "got them now; never had OR/scopes" (02/03/2016)  . On home oxygen therapy    "3L; 24/7" (02/21/2017)  . Other diseases of lung, not elsewhere classified   . Personal history of colonic polyps 04/26/2007   hyperplastic   . Pneumonia "several times"    Past Surgical History:  Procedure Laterality Date  . CARDIAC CATHETERIZATION  02/03/2016  . CARDIAC CATHETERIZATION  09/30/2009   non-obstructive CAD w/30% narrowing in prox LAD (Dr. Waunita Schooner)  . CARDIAC CATHETERIZATION  05/04/2001   same at 2011 cath (Dr. Waunita Schooner)  . CARDIAC CATHETERIZATION N/A 02/03/2016   Procedure: Right/Left Heart Cath and Coronary Angiography;  Surgeon: Pixie Casino, MD;  Location: [place] [facility];  Service: Cardiovascular;  Laterality: N/A;  . EPIGLOTOPLASTY W/ MLB     removal due to carcinoma  . EXCISIONAL HEMORRHOIDECTOMY  2000s  . HAND SURGERY Right 1958   d/t crush injury  . TONSILLECTOMY AND ADENOIDECTOMY    . ULTRASOUND GUIDANCE FOR VASCULAR ACCESS  02/03/2016   Procedure: Ultrasound Guidance For Vascular Access;  Surgeon: Pixie Casino, MD;  Location: [place] [facility];  Service: Cardiovascular;;  . VASECTOMY      FAMHx:  Family History  Problem Relation Age of Onset  .  Heart disease Father   . Colon cancer Father   . Prostate cancer Father   . Stroke Mother   . Endometrial cancer Sister   . Stroke Maternal Grandmother   . Cancer Paternal Grandmother     SOCHx:   reports that he quit smoking about 28 years ago. His smoking use included Cigarettes. He has a 70.00 pack-year smoking history. He quit smokeless tobacco use about 15 years ago. His smokeless tobacco use included Chew. He reports that he does not drink alcohol or use drugs.  ALLERGIES:  Allergies  Allergen Reactions  . Codeine Other (See Comments)    Thought head was going to blow off/ severe headache  . Adhesive [Tape] Other (See Comments)     Band-aids - tears skin off  . Gluten Meal Other (See Comments)    Celiac disease    ROS: Pertinent items noted in HPI and remainder of comprehensive ROS otherwise negative.  HOME MEDS: Current Outpatient Prescriptions  Medication Sig Dispense Refill  . albuterol (PROAIR HFA) 108 (90 Base) MCG/ACT inhaler INHALE 2 PUFFS EVERY 4 HOURS AS NEEDED FOR SHORTNESS OF BREATH. (Patient taking differently: Inhale 2 puffs into the lungs every 4 (four) hours as needed for shortness of breath. ) 2 Inhaler 5  . amiodarone (PACERONE) 200 MG tablet Take 0.5 tablets (100 mg total) by mouth daily. 15 tablet 11  . aspirin EC 81 MG tablet Take 81 mg by mouth daily.    Marland Kitchen azelastine (ASTELIN) 0.1 % nasal spray PLACE 2 SPRAYS INTO EACH NOSTRILS TWICE DAILY AS DIRECTED. (Patient taking differently: PLACE 2 SPRAYS INTO EACH NOSTRILS TWICE DAILY AS NEEDED FOR CONGESTION) 30 mL 0  . Calcium Carbonate-Vitamin D (CALTRATE 600+D) 600-400 MG-UNIT tablet Take 1 tablet by mouth daily.    Marland Kitchen dextromethorphan-guaiFENesin (MUCINEX DM) 30-600 MG 12hr tablet Take 1 tablet by mouth 2 (two) times daily.    Marland Kitchen ENTRESTO 49-51 MG TAKE (1) TABLET BY MOUTH TWICE DAILY. 60 tablet 11  . LUTEIN PO Take 1 capsule by mouth daily.    . montelukast (SINGULAIR) 10 MG tablet TAKE 1 TABLET BY MOUTH DAILY. 90 tablet 0  . Multiple Vitamins-Minerals (CENTRUM SILVER 50+MEN) TABS Take 1 tablet by mouth daily.    . OXYGEN Inhale 3 L into the lungs continuous. 3lpm with rest and 6 lpm with exertion    . pantoprazole (PROTONIX) 40 MG tablet TAKE ONE TABLET BY MOUTH DAILY. (Patient taking differently: TAKE ONE TABLET BY MOUTH EVERY OTHER DAY) 90 tablet 3  . polyethylene glycol (MIRALAX / GLYCOLAX) packet Take 17 g by mouth daily as needed for mild constipation. Mix in 8 oz liquid and drink    . predniSONE (DELTASONE) 20 MG tablet Take 3 tablets (60 mg total) by mouth daily with breakfast. 15 tablet 0  . spironolactone (ALDACTONE) 25 MG tablet Take 0.5  tablets (12.5 mg total) by mouth daily. 16 tablet 6  . SYMBICORT 160-4.5 MCG/ACT inhaler INHALE 2 PUFFS FIRST THING IN MORNING AND THEN ANOTHER 2 PUFFS ABOUT 12 HOURS LATER. 10.2 g 11  . Tiotropium Bromide Monohydrate (SPIRIVA RESPIMAT) 2.5 MCG/ACT AERS Inhale 2 puffs into the lungs daily. 1 Inhaler 11   No current facility-administered medications for this encounter.     LABS/IMAGING: No results found for this or any previous visit (from the past 48 hour(s)). No results found.  WEIGHTS: Wt Readings from Last 3 Encounters:  06/10/17 125 lb 12.8 oz (57.1 kg)  05/31/17 127 lb (57.6 kg)  04/14/17  126 lb 9.6 oz (57.4 kg)    VITALS: BP 125/79   Pulse 68   Wt 125 lb 12.8 oz (57.1 kg)   SpO2 95% Comment: 2LNC  BMI 19.70 kg/m   Wt Readings from Last 3 Encounters:  06/10/17 125 lb 12.8 oz (57.1 kg)  05/31/17 127 lb (57.6 kg)  04/14/17 126 lb 9.6 oz (57.4 kg)     EXAM: General:  Elderly thin. On O2  No resp difficulty HEENT: normal Neck: supple. jvp 7. Carotids 2+ bilat; no bruits. No lymphadenopathy or thryomegaly appreciated. Cor: PMI nondisplaced. Regular rate & rhythm. No rubs, gallops or murmurs. Lungs: clear markedly reduced BS throughout Abdomen: soft, nontender, nondistended. No hepatosplenomegaly. No bruits or masses. Good bowel sounds. Extremities: no cyanosis, clubbing, rash, edema Neuro: alert & orientedx3, cranial nerves grossly intact. moves all 4 extremities w/o difficulty. Affect pleasant  ECG NSR with RBBB 1 PVC Personally reviewed  ASSESSMENT: 1. Chronic systolic HF EF 90%. EF now normal by Echo 04/12/16 LVEF 50-55%, Trivial AI, Mild Bruce, Mild LAE, PA peak pressure 46 mm Hg. Echo today with EF 50-55%. Reviewed personally   - likely due to PVC cardiomyopathy     - EF improved with PVC suppression on amio    - Doing well. NYHA II Stable. Volume status ok.    - Continue Entresto 49/51 and spiro 12.5 mg daily.  Will not uptitrate with age and borderline  hyperkalemia.     - No b-blocker with severe COPD   2. PVC related cardiomyopathy   -PVCs suppressed with amio with normalization of LV function. amio now 187m daily Will continue   - Agree with concern risk of amio toxicity given the severity of his COPD. That said risk of lung toxicity on amio 100 daily is very low and he was very ill with severe PVC cardiomyopathy. Not good candidate for PVC ablation. I discussed with Dr. ARayann Hemanin EP and we both agree that risk of stopping amio outweighs risk of continuing low-dose amio.    -check TSH, LFTs. Recent eye exam 1/18 was ok.  3. Chronic respiratory failure due to COPD on home O2 4. HTN    -Blood pressure well controlled. Continue current regimen.   BGlori BickersMD 06/10/2017, 10:43 AM

## 2017-06-10 NOTE — Patient Instructions (Signed)
We will contact you in 6 months to schedule your next appointment.  

## 2017-06-13 ENCOUNTER — Telehealth (HOSPITAL_COMMUNITY): Payer: Self-pay | Admitting: *Deleted

## 2017-06-13 NOTE — Telephone Encounter (Signed)
At [facility] Fri 10/19 pt requested to get a small portable oxygen to carry around instead of the big tanks.  Ok to order per Dr Haroldine Laws.  Called [facility] as they provide pt's home oxygen, order faxed to them for "POC w/titration" to [phone].  Pt is aware this has been done.

## 2017-06-14 ENCOUNTER — Encounter (HOSPITAL_COMMUNITY)
Admission: RE | Admit: 2017-06-14 | Discharge: 2017-06-14 | Disposition: A | Discharge status: Home / Self Care | Payer: Self-pay | Source: Ambulatory Visit | Attending: Internal Medicine | Admitting: Internal Medicine

## 2017-06-15 IMAGING — NM NM MISC PROCEDURE
4 series · 24 of 24 positions shown · non-contrast
Comparison: none

[Series 1: wbr rest · 6.40mm/px · 6 of 64 frames shown]
[frame 6/64]
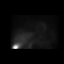
[frame 16/64]
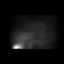
[frame 27/64]
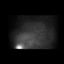
[frame 38/64]
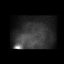
[frame 48/64]
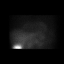
[frame 59/64]
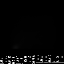

[Series 1: wbr_r-proj_st wbr rest · 6.40mm/px · 6 of 64 frames shown]
[frame 6/64]
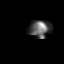
[frame 16/64]
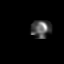
[frame 27/64]
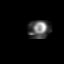
[frame 38/64]
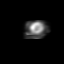
[frame 48/64]
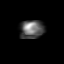
[frame 59/64]
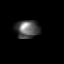

[Series 2: wbr stress ng · 6.40mm/px · 6 of 64 frames shown]
[frame 6/64]
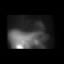
[frame 16/64]
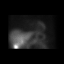
[frame 27/64]
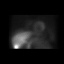
[frame 38/64]
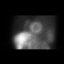
[frame 48/64]
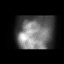
[frame 59/64]
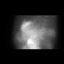

[Series 2: wbr_s-proj_st wbr stress ng · 6.40mm/px · 6 of 64 frames shown]
[frame 6/64]
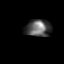
[frame 16/64]
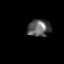
[frame 27/64]
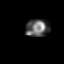
[frame 38/64]
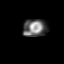
[frame 48/64]
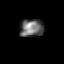
[frame 59/64]
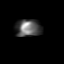

[24 of 24 positions shown; findings below may reference images not displayed]

Canned report from images found in remote index.

Refer to host system for actual result text.

## 2017-06-16 ENCOUNTER — Encounter (HOSPITAL_COMMUNITY)
Admission: RE | Admit: 2017-06-16 | Discharge: 2017-06-16 | Disposition: A | Discharge status: Home / Self Care | Payer: Self-pay | Source: Ambulatory Visit | Attending: Internal Medicine | Admitting: Internal Medicine

## 2017-06-21 ENCOUNTER — Encounter (HOSPITAL_COMMUNITY)
Admission: RE | Admit: 2017-06-21 | Discharge: 2017-06-21 | Disposition: A | Discharge status: Home / Self Care | Payer: Self-pay | Source: Ambulatory Visit | Attending: Internal Medicine | Admitting: Internal Medicine

## 2017-06-23 ENCOUNTER — Encounter (HOSPITAL_COMMUNITY)
Admission: RE | Admit: 2017-06-23 | Discharge: 2017-06-23 | Disposition: A | Discharge status: Home / Self Care | Payer: Self-pay | Source: Ambulatory Visit | Attending: Internal Medicine | Admitting: Internal Medicine

## 2017-06-23 DIAGNOSIS — I5021 Acute systolic (congestive) heart failure: Secondary | ICD-10-CM | POA: Insufficient documentation

## 2017-06-28 ENCOUNTER — Encounter (HOSPITAL_COMMUNITY)
Admission: RE | Admit: 2017-06-28 | Discharge: 2017-06-28 | Disposition: A | Discharge status: Home / Self Care | Payer: Self-pay | Source: Ambulatory Visit | Attending: Internal Medicine | Admitting: Internal Medicine

## 2017-06-30 ENCOUNTER — Encounter (HOSPITAL_COMMUNITY)
Admission: RE | Admit: 2017-06-30 | Discharge: 2017-06-30 | Disposition: A | Discharge status: Home / Self Care | Payer: Self-pay | Source: Ambulatory Visit | Attending: Internal Medicine | Admitting: Internal Medicine

## 2017-07-05 ENCOUNTER — Encounter (HOSPITAL_COMMUNITY): Payer: Self-pay

## 2017-07-07 ENCOUNTER — Encounter (HOSPITAL_COMMUNITY)
Admission: RE | Admit: 2017-07-07 | Discharge: 2017-07-07 | Disposition: A | Discharge status: Home / Self Care | Payer: Medicare Other | Source: Ambulatory Visit | Attending: Internal Medicine | Admitting: Internal Medicine

## 2017-07-11 ENCOUNTER — Ambulatory Visit (INDEPENDENT_AMBULATORY_CARE_PROVIDER_SITE_OTHER)
Admission: RE | Admit: 2017-07-11 | Discharge: 2017-07-11 | Disposition: A | Discharge status: Home / Self Care | Payer: Medicare Other | Source: Ambulatory Visit | Attending: Internal Medicine | Admitting: Internal Medicine

## 2017-07-11 ENCOUNTER — Encounter: Payer: Self-pay | Admitting: Internal Medicine

## 2017-07-11 ENCOUNTER — Ambulatory Visit: Payer: Medicare Other | Admitting: Internal Medicine

## 2017-07-11 VITALS — BP 124/72 | HR 72 | Ht 66.0 in | Wt 128.8 lb

## 2017-07-11 DIAGNOSIS — J9611 Chronic respiratory failure with hypoxia: Secondary | ICD-10-CM

## 2017-07-11 DIAGNOSIS — J439 Emphysema, unspecified: Secondary | ICD-10-CM

## 2017-07-11 DIAGNOSIS — R918 Other nonspecific abnormal finding of lung field: Secondary | ICD-10-CM | POA: Diagnosis not present

## 2017-07-11 DIAGNOSIS — J449 Chronic obstructive pulmonary disease, unspecified: Secondary | ICD-10-CM

## 2017-07-11 NOTE — Progress Notes (Signed)
Subjective:    Patient ID: Bruce Travis, male    DOB: 1935-03-24    MRN: [id number]  Brief patient profile:   26   yowm  quit smoking 1990 with GOLD II  COPD,  MPNs,  remote laryngeal cancer with some dysphagia, allergic rhinitis, and esophageal reflux.    History of Present Illness  11/08/2011 Twania Travis/ f/u ov on maint rx with symbicort and spiriva c/o nasal symptoms worse but  breathing fine, no limits as long as paces himself and no cough.  Main nasal issue is congestion/itching/sneezing worse x 2 weeks with purulent bloody secretions s HA/toothache  But ahs noted  year round nasal symptoms and worse x 2 years which do not correlate with breathing status Stopped nasonex due to insurance > no worse off it> has not f/u with [name] yet for ent re-eval No daytime saba need rec Augmentin 875 mg twice daily x 10days Prednisone 10 mg take  4 each am x 2 days,   2 each am x 2 days,  1 each am x2days and stop See Dr [name] in 2 weeks to let him direct you further on what to do about your chronic sinus symptoms      03/06/2013 f/u ov/Bruce Travis re GOLD II COPD/ chronic hoarse p surg ([facility])/ maint symb/ spiriva Chief Complaint  Patient presents with  . Follow-up    Pt states breathing is the same since last visit, no better or worse. Denies any new co's today.    main issue is yardwork in heat, bettter ex tol p uses saba, hfa good techniqe, never uses neb  rec Only use your albuterol (proaire) as a rescue medication Please see patient coordinator before you leave today  to schedule CT chest to compare to previous studies for your lung nodules> no change x 4 years so directed f/u indicated        04/12/2017  f/u ov/Bruce Travis re:  GOLD II copd / maint symb 160/spiriva dpi  Chief Complaint  Patient presents with  . Follow-up    Breathing is much improved since the last visit. He is attending pulmonary rehab 2 x per wk. He is using proair once per wk on average.   On 3lpm most the time/ 6lpm with exertion  like at rehab, doing about the same vs a year prior to OV   Back to doing some walking at [facility] behind a cart with 02 in the cart  Using proair first thing in am ? Why as supposed to use just prn  No worse sob off prednisone  rec Try spiriva respimat 2 pffs each am right behind the symbicort      07/11/2017  f/u ov/Bruce Travis re: copd II/ maint symb and spiriva smi Chief Complaint  Patient presents with  . Follow-up    Breathing is doing well. He uses his proair 1 x per wk on average.    maint rehab going well  Walks at lowe's slow pace @ 6lpm / 3lpm at rest and sleep and sats staying 90 or better most of the time   No obvious day to day or daytime variability or assoc excess/ purulent sputum or mucus plugs or hemoptysis or cp or chest tightness, subjective wheeze or overt sinus or hb symptoms. No unusual exp hx or h/o childhood pna/ asthma or knowledge of premature birth.  Sleeping ok flat without nocturnal  or early am exacerbation  of respiratory  c/o's or need for noct saba. Also denies any obvious fluctuation  of symptoms with weather or environmental changes or other aggravating or alleviating factors except as outlined above   Current Allergies, Complete Past Medical History, Past Surgical History, Family History, and Social History were reviewed in [employer] record.  ROS  The following are not active complaints unless bolded Hoarseness, sore throat, dysphagia, dental problems, itching, sneezing,  nasal congestion or discharge of excess mucus or purulent secretions, ear ache,   fever, chills, sweats, unintended wt loss or wt gain, classically pleuritic or exertional cp,  orthopnea pnd or leg swelling, presyncope, palpitations, abdominal pain, anorexia, nausea, vomiting, diarrhea  or change in bowel habits or change in bladder habits, change in stools or change in urine, dysuria, hematuria,  rash, arthralgias, visual complaints, headache, numbness, weakness or ataxia or  problems with walking or coordination,  change in mood/affect or memory.        Current Meds  Medication Sig  . albuterol (PROAIR HFA) 108 (90 Base) MCG/ACT inhaler INHALE 2 PUFFS EVERY 4 HOURS AS NEEDED FOR SHORTNESS OF BREATH. (Patient taking differently: Inhale 2 puffs into the lungs every 4 (four) hours as needed for shortness of breath. )  . amiodarone (PACERONE) 200 MG tablet Take 0.5 tablets (100 mg total) by mouth daily.  Marland Kitchen aspirin EC 81 MG tablet Take 81 mg by mouth daily.  Marland Kitchen azelastine (ASTELIN) 0.1 % nasal spray PLACE 2 SPRAYS INTO EACH NOSTRILS TWICE DAILY AS DIRECTED. (Patient taking differently: PLACE 2 SPRAYS INTO EACH NOSTRILS TWICE DAILY AS NEEDED FOR CONGESTION)  . Calcium Carbonate-Vitamin D (CALTRATE 600+D) 600-400 MG-UNIT tablet Take 1 tablet by mouth daily.  Marland Kitchen dextromethorphan-guaiFENesin (MUCINEX DM) 30-600 MG 12hr tablet Take 1 tablet by mouth 2 (two) times daily.  Marland Kitchen ENTRESTO 49-51 MG TAKE (1) TABLET BY MOUTH TWICE DAILY.  Marland Kitchen LUTEIN PO Take 1 capsule by mouth daily.  . montelukast (SINGULAIR) 10 MG tablet TAKE 1 TABLET BY MOUTH DAILY.  . Multiple Vitamins-Minerals (CENTRUM SILVER 50+MEN) TABS Take 1 tablet by mouth daily.  . OXYGEN Inhale 3 L into the lungs continuous. 3lpm with rest and 6 lpm with exertion  . pantoprazole (PROTONIX) 40 MG tablet TAKE ONE TABLET BY MOUTH DAILY. (Patient taking differently: TAKE ONE TABLET BY MOUTH EVERY OTHER DAY)  . polyethylene glycol (MIRALAX / GLYCOLAX) packet Take 17 g by mouth daily as needed for mild constipation. Mix in 8 oz liquid and drink  . spironolactone (ALDACTONE) 25 MG tablet Take 0.5 tablets (12.5 mg total) by mouth daily.  . SYMBICORT 160-4.5 MCG/ACT inhaler INHALE 2 PUFFS FIRST THING IN MORNING AND THEN ANOTHER 2 PUFFS ABOUT 12 HOURS LATER.  . Tiotropium Bromide Monohydrate (SPIRIVA RESPIMAT) 2.5 MCG/ACT AERS Inhale 2 puffs into the lungs daily.                         Objective:   Physical Exam  Hoarse amb   wm nad  Vital signs reviewed - Note on arrival 02 sats  93% on 3lpm     Wt  140 04/08/2011 > 06/08/2011 141 >136 09/09/2011 > 141 11/08/2011 > 03/07/2012  139 > 09/07/2012  141 > 136 03/06/2013 > 03/01/2017   128 >  04/12/2017  125 > 07/11/2017   128      LUNGS: no acc muscle use,  Nl contour chest with scattered mild  insp and exp rhonchi bilaterally -no localized wheeze     HEENT: nl   turbinates bilaterally, and oropharynx. Nl external  ear canals without cough reflex - edentulous   NECK :  without JVD/Nodes/TM/ nl carotid upstrokes bilaterally   LUNGS: no acc muscle use,  Mod barrel shaped chest/ hyper resonant to percussion with distant bs s localized wheezes/ rhonchi or cough on insp or exp    CV:  RRR  no s3 or murmur or increase in P2, and no edema   ABD:  soft and nontender with Pos early hoover's sign  in the supine position. No bruits or organomegaly appreciated, bowel sounds nl  MS:  Nl gait/ ext warm without deformities, calf tenderness, cyanosis or clubbing No obvious joint restrictions   SKIN: warm and dry without lesions    NEURO:  alert, approp, nl sensorium with  no motor or cerebellar deficits apparent.        CXR PA and Lateral:   07/11/2017 :    I personally reviewed images and agree with radiology impression as follows:     1. Previously identified left lower lobe atelectasis/ infiltrate has cleared.  2. Severe bullous COPD. Stable pulmonary nodules, most likely granulomas.          Assessment & Plan:

## 2017-07-11 NOTE — Assessment & Plan Note (Signed)
Adequate control on present rx, reviewed in detail with pt > no change in rx needed  = 3lpm 24/7 and 6lpm with exertion   Discussed amiodarone concern and need to monitor 02 sats with exertion and call if losing ground between ov's

## 2017-07-11 NOTE — Assessment & Plan Note (Signed)
-   PFT's 06/08/11  FEV1  1.71 (67%) ratio 37 and no better p B2 with DLC0 54% -  PFT's  04/12/2017  FEV1 1.62  (65 % ) ratio 43  p 6 % improvement from saba p spiriva  prior to study with DLCO  24/23 % corrects to 27  % for alv volume   - 04/12/2017  After extensive coaching device effectiveness =    75% so changed to spiriva smi       - The proper method of use, as well as anticipated side effects, of a metered-dose inhaler are discussed and demonstrated to the patient. Asked him to bring in each of his inhalers at next ov    Well compensated on lama/laba/ics > no change in rx needed    I had an extended discussion with the patient and son who is physician reviewing all relevant studies completed to date and  lasting 15 to 20 minutes of a 25 minute visit    Each maintenance medication was reviewed in detail including most importantly the difference between maintenance and prns and under what circumstances the prns are to be triggered using an action plan format that is not reflected in the computer generated alphabetically organized AVS.    Please see AVS for specific instructions unique to this visit that I personally wrote and verbalized to the the pt in detail and then reviewed with pt  by my nurse highlighting any  changes in therapy recommended at today's visit to their plan of care.

## 2017-07-11 NOTE — Assessment & Plan Note (Addendum)
>  CT 2010 no changes  >CT chest 03/12/2011 >>Severe centrilobular emphysema with areas of new and persistent  nodularity  A new somewhat spiculated opacity within the left upper lobe  measures 1.6 x 2.5 cm on transverse image 13 of series 6 > CT chest 03/07/2012 1. The vast majority of the previously noted pulmonary nodules are unchanged in size, number and distribution (and the overall appearance of the lungs could suggest sequelae of a pneumoconiosis or simply sequelae of old granulomatous disease), with exception of the right lower lobe pulmonary nodules as mentioned above. The largest of these new nodules is 7 mm and has spiculated margins and some surrounding architectural distortion.  - Repeat ct rec 03/06/2013 > No changes so no dedicated f/u  -  Chest CT w/o contrast 02/22/17 Bilateral lower lobe bronchiectasis with bilateral lower lobe tree-in-bud opacities compatible with infection. Mucous fills multiple bilateral lower lobe bronchi. 4 x 3.5 x 2 cm confluent peripheral opacity within the posterior left lower lobe is suggestive of atelectasis.    cxr clear in LLL   Discussed in detail all the  indications, usual  risks and alternatives  relative to the benefits with patient who agrees to proceed with conservative f/u s ct needed unless new symptoms

## 2017-07-11 NOTE — Patient Instructions (Addendum)
Please remember to go to the  x-ray department downstairs in the basement  for your tests - we will call you with the results when they are available.  Please schedule a follow up visit in 4  months but call sooner if needed -  Bring your inhalers with you to that visit so we can verify optimal inhaler technique with each device

## 2017-07-12 ENCOUNTER — Encounter (HOSPITAL_COMMUNITY)
Admission: RE | Admit: 2017-07-12 | Discharge: 2017-07-12 | Disposition: A | Discharge status: Home / Self Care | Payer: Self-pay | Source: Ambulatory Visit | Attending: Internal Medicine | Admitting: Internal Medicine

## 2017-07-12 NOTE — Progress Notes (Signed)
Spoke with pt and notified of results per Dr. [name]. Pt verbalized understanding and denied any questions. 

## 2017-07-13 ENCOUNTER — Encounter: Payer: Self-pay | Admitting: Internal Medicine

## 2017-07-19 ENCOUNTER — Encounter (HOSPITAL_COMMUNITY)
Admission: RE | Admit: 2017-07-19 | Discharge: 2017-07-19 | Disposition: A | Discharge status: Home / Self Care | Payer: Self-pay | Source: Ambulatory Visit | Attending: Internal Medicine | Admitting: Internal Medicine

## 2017-07-20 ENCOUNTER — Ambulatory Visit: Payer: Medicare Other | Admitting: Internal Medicine

## 2017-07-21 ENCOUNTER — Encounter (HOSPITAL_COMMUNITY)
Admission: RE | Admit: 2017-07-21 | Discharge: 2017-07-21 | Disposition: A | Discharge status: Home / Self Care | Payer: Self-pay | Source: Ambulatory Visit | Attending: Internal Medicine | Admitting: Internal Medicine

## 2017-07-26 ENCOUNTER — Encounter (HOSPITAL_COMMUNITY)
Admission: RE | Admit: 2017-07-26 | Discharge: 2017-07-26 | Disposition: A | Discharge status: Home / Self Care | Payer: Self-pay | Source: Ambulatory Visit | Attending: Internal Medicine | Admitting: Internal Medicine

## 2017-07-26 DIAGNOSIS — I5021 Acute systolic (congestive) heart failure: Secondary | ICD-10-CM | POA: Insufficient documentation

## 2017-07-28 ENCOUNTER — Encounter (HOSPITAL_COMMUNITY)
Admission: RE | Admit: 2017-07-28 | Discharge: 2017-07-28 | Disposition: A | Discharge status: Home / Self Care | Payer: Self-pay | Source: Ambulatory Visit | Attending: Internal Medicine | Admitting: Internal Medicine

## 2017-08-02 ENCOUNTER — Encounter (HOSPITAL_COMMUNITY)
Admission: RE | Admit: 2017-08-02 | Discharge: 2017-08-02 | Disposition: A | Discharge status: Home / Self Care | Payer: Self-pay | Source: Ambulatory Visit | Attending: Internal Medicine | Admitting: Internal Medicine

## 2017-08-04 ENCOUNTER — Encounter (HOSPITAL_COMMUNITY)
Admission: RE | Admit: 2017-08-04 | Discharge: 2017-08-04 | Disposition: A | Discharge status: Home / Self Care | Payer: Self-pay | Source: Ambulatory Visit | Attending: Internal Medicine | Admitting: Internal Medicine

## 2017-08-08 ENCOUNTER — Other Ambulatory Visit: Payer: Self-pay | Admitting: Family Medicine

## 2017-08-09 ENCOUNTER — Encounter (HOSPITAL_COMMUNITY)
Admission: RE | Admit: 2017-08-09 | Discharge: 2017-08-09 | Disposition: A | Discharge status: Home / Self Care | Payer: Self-pay | Source: Ambulatory Visit | Attending: Internal Medicine | Admitting: Internal Medicine

## 2017-08-11 ENCOUNTER — Encounter (HOSPITAL_COMMUNITY)
Admission: RE | Admit: 2017-08-11 | Discharge: 2017-08-11 | Disposition: A | Discharge status: Home / Self Care | Payer: Self-pay | Source: Ambulatory Visit | Attending: Internal Medicine | Admitting: Internal Medicine

## 2017-08-18 ENCOUNTER — Encounter (HOSPITAL_COMMUNITY)
Admission: RE | Admit: 2017-08-18 | Discharge: 2017-08-18 | Disposition: A | Discharge status: Home / Self Care | Payer: Self-pay | Source: Ambulatory Visit | Attending: Internal Medicine | Admitting: Internal Medicine

## 2017-08-25 ENCOUNTER — Encounter (HOSPITAL_COMMUNITY): Payer: Self-pay

## 2017-08-25 DIAGNOSIS — I5021 Acute systolic (congestive) heart failure: Secondary | ICD-10-CM | POA: Insufficient documentation

## 2017-08-30 ENCOUNTER — Encounter (HOSPITAL_COMMUNITY)
Admission: RE | Admit: 2017-08-30 | Discharge: 2017-08-30 | Disposition: A | Discharge status: Home / Self Care | Payer: Self-pay | Source: Ambulatory Visit | Attending: Internal Medicine | Admitting: Internal Medicine

## 2017-09-01 ENCOUNTER — Encounter (HOSPITAL_COMMUNITY)
Admission: RE | Admit: 2017-09-01 | Discharge: 2017-09-01 | Disposition: A | Discharge status: Home / Self Care | Payer: Self-pay | Source: Ambulatory Visit | Attending: Internal Medicine | Admitting: Internal Medicine

## 2017-09-02 ENCOUNTER — Ambulatory Visit: Payer: Medicare Other | Admitting: Family Medicine

## 2017-09-02 ENCOUNTER — Encounter: Payer: Self-pay | Admitting: Family Medicine

## 2017-09-02 VITALS — BP 100/70 | HR 84 | Temp 97.5°F | Resp 18 | Ht 66.5 in | Wt 131.0 lb

## 2017-09-02 DIAGNOSIS — S61411A Laceration without foreign body of right hand, initial encounter: Secondary | ICD-10-CM

## 2017-09-02 NOTE — Progress Notes (Signed)
Subjective:    Patient ID: Bruce Travis, male    DOB: [birth year], 83 y.o.   MRN: [id number]  HPI Yesterday, patient suffered a large skin tear on the dorsal surface of his right hand. Skin tear is in a V-shaped configuration.  Continues to bleed through the bandage that the patient applied last evening Past Medical History:  Diagnosis Date  . Allergic rhinitis   . Bronchiectasis   . CAD (coronary artery disease)   . Celiac disease   . Chronic heart failure ([place])   . Chronic kidney disease (CKD), stage III (moderate) (HCC)   . COPD (chronic obstructive pulmonary disease) ([place])   . Diverticulosis of colon (without mention of hemorrhage)   . Esophageal reflux   . Family history of adverse reaction to anesthesia    daughter gets PONV  . Family hx of colon cancer   . History of stomach ulcers 1980s  . Hyperlipemia   . Hypertension   . 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 Myocardial infarction [facility])    "previous MI/echo in 12/2015"  . Nephrolithiasis    "got them now; never had OR/scopes" (02/03/2016)  . On home oxygen therapy    "3L; 24/7" (02/21/2017)  . Other diseases of lung, not elsewhere classified   . Personal history of colonic polyps 04/26/2007   hyperplastic   . Pneumonia "several times"   Past Surgical History:  Procedure Laterality Date  . CARDIAC CATHETERIZATION  02/03/2016  . CARDIAC CATHETERIZATION  09/30/2009   non-obstructive CAD w/30% narrowing in prox LAD (Dr. Waunita Schooner)  . CARDIAC CATHETERIZATION  05/04/2001   same at 2011 cath (Dr. Waunita Schooner)  . CARDIAC CATHETERIZATION N/A 02/03/2016   Procedure: Right/Left Heart Cath and Coronary Angiography;  Surgeon: Pixie Casino, MD;  Location: [place] [facility];  Service: Cardiovascular;  Laterality: N/A;  . EPIGLOTOPLASTY W/ MLB     removal due to carcinoma  . EXCISIONAL HEMORRHOIDECTOMY  2000s  . HAND SURGERY Right 1958   d/t crush injury  . TONSILLECTOMY AND ADENOIDECTOMY    .  ULTRASOUND GUIDANCE FOR VASCULAR ACCESS  02/03/2016   Procedure: Ultrasound Guidance For Vascular Access;  Surgeon: Pixie Casino, MD;  Location: [place] [facility];  Service: Cardiovascular;;  . VASECTOMY     Current Outpatient Medications on File Prior to Visit  Medication Sig Dispense Refill  . albuterol (PROAIR HFA) 108 (90 Base) MCG/ACT inhaler INHALE 2 PUFFS EVERY 4 HOURS AS NEEDED FOR SHORTNESS OF BREATH. (Patient taking differently: Inhale 2 puffs into the lungs every 4 (four) hours as needed for shortness of breath. ) 2 Inhaler 5  . amiodarone (PACERONE) 200 MG tablet Take 0.5 tablets (100 mg total) by mouth daily. 15 tablet 11  . aspirin EC 81 MG tablet Take 81 mg by mouth daily.    Marland Kitchen azelastine (ASTELIN) 0.1 % nasal spray PLACE 2 SPRAYS INTO EACH NOSTRILS TWICE DAILY AS DIRECTED. (Patient taking differently: PLACE 2 SPRAYS INTO EACH NOSTRILS TWICE DAILY AS NEEDED FOR CONGESTION) 30 mL 0  . Calcium Carbonate-Vitamin D (CALTRATE 600+D) 600-400 MG-UNIT tablet Take 1 tablet by mouth daily.    Marland Kitchen dextromethorphan-guaiFENesin (MUCINEX DM) 30-600 MG 12hr tablet Take 1 tablet by mouth 2 (two) times daily.    Marland Kitchen ENTRESTO 49-51 MG TAKE (1) TABLET BY MOUTH TWICE DAILY. 60 tablet 11  . LUTEIN PO Take 1 capsule by mouth daily.    . montelukast (SINGULAIR) 10 MG  tablet TAKE 1 TABLET BY MOUTH DAILY. 90 tablet 0  . montelukast (SINGULAIR) 10 MG tablet TAKE 1 TABLET BY MOUTH DAILY. 90 tablet 3  . Multiple Vitamins-Minerals (CENTRUM SILVER 50+MEN) TABS Take 1 tablet by mouth daily.    . OXYGEN Inhale 3 L into the lungs continuous. 3lpm with rest and 6 lpm with exertion    . pantoprazole (PROTONIX) 40 MG tablet TAKE ONE TABLET BY MOUTH DAILY. (Patient taking differently: TAKE ONE TABLET BY MOUTH EVERY OTHER DAY) 90 tablet 3  . polyethylene glycol (MIRALAX / GLYCOLAX) packet Take 17 g by mouth daily as needed for mild constipation. Mix in 8 oz liquid and drink    . spironolactone (ALDACTONE) 25 MG tablet  Take 0.5 tablets (12.5 mg total) by mouth daily. 16 tablet 6  . SYMBICORT 160-4.5 MCG/ACT inhaler INHALE 2 PUFFS FIRST THING IN MORNING AND THEN ANOTHER 2 PUFFS ABOUT 12 HOURS LATER. 10.2 g 11  . Tiotropium Bromide Monohydrate (SPIRIVA RESPIMAT) 2.5 MCG/ACT AERS Inhale 2 puffs into the lungs daily. 1 Inhaler 11   No current facility-administered medications on file prior to visit.    Allergies  Allergen Reactions  . Codeine Other (See Comments)    Thought head was going to blow off/ severe headache  . Adhesive [Tape] Other (See Comments)    Band-aids - tears skin off  . Gluten Meal Other (See Comments)    Celiac disease   Social History   Socioeconomic History  . Marital status: Married    Spouse name: Not on file  . Number of children: 3  . Years of education: Not on file  . Highest education level: Not on file  Social Needs  . Financial resource strain: Not on file  . Food insecurity - worry: Not on file  . Food insecurity - inability: Not on file  . Transportation needs - medical: Not on file  . Transportation needs - non-medical: Not on file  Occupational History  . 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 Smoking status: Former Smoker    Packs/day: 2.00    Years: 35.00    Pack years: 70.00    Types: Cigarettes    Last attempt to quit: 01/21/1989    Years since quitting: 28.6  . Smokeless tobacco: Former Systems developer    Types: [place] date: 08/23/2001  Substance and Sexual Activity  . Alcohol use: No  . Drug use: No  . Sexual activity: Not on file  Other Topics Concern  . Not on file  Social History Narrative  . Not on file   .               Review of Systems  All other systems reviewed and are negative.      Objective:   Physical Exam  Cardiovascular: Normal rate, regular rhythm and normal heart sounds.  Pulmonary/Chest: Effort normal and breath sounds normal. No respiratory distress. He has no wheezes.  He has no rales.  Musculoskeletal:       Hands: Vitals reviewed.         Assessment & Plan:  Laceration of right hand without foreign body, initial encounter  The laceration was anesthetized with 0.1% lidocaine with epinephrine. The wound was then cleaned thoroughly with Betadine. The skin edges were then approximated with 8, 4-0 Ethilon simple interrupted sutures. The wound was then dressed with Silvadene, a nonadherent gauze, and wrapped  in Coban and. Wound care was discussed. Recheck in one week for suture removal or sooner if complications arise

## 2017-09-06 ENCOUNTER — Encounter (HOSPITAL_COMMUNITY)
Admission: RE | Admit: 2017-09-06 | Discharge: 2017-09-06 | Disposition: A | Discharge status: Home / Self Care | Payer: Self-pay | Source: Ambulatory Visit | Attending: Internal Medicine | Admitting: Internal Medicine

## 2017-09-08 ENCOUNTER — Encounter (HOSPITAL_COMMUNITY)
Admission: RE | Admit: 2017-09-08 | Discharge: 2017-09-08 | Disposition: A | Discharge status: Home / Self Care | Payer: Self-pay | Source: Ambulatory Visit | Attending: Internal Medicine | Admitting: Internal Medicine

## 2017-09-09 ENCOUNTER — Ambulatory Visit: Payer: Medicare Other | Admitting: Family Medicine

## 2017-09-09 ENCOUNTER — Encounter: Payer: Self-pay | Admitting: Family Medicine

## 2017-09-09 VITALS — BP 122/70 | HR 74 | Temp 97.6°F | Resp 20 | Ht 66.5 in | Wt 132.0 lb

## 2017-09-09 DIAGNOSIS — Z4802 Encounter for removal of sutures: Secondary | ICD-10-CM

## 2017-09-09 NOTE — Progress Notes (Signed)
Subjective:    Patient ID: Bruce Travis, male    DOB: 06/05/1935, 83 y.o.   MRN: [id number]  HPI  09/02/17 Yesterday, patient suffered a large skin tear on the dorsal surface of his right hand. Skin tear is in a V-shaped configuration.  Continues to bleed through the bandage that the patient applied last evening.  At that time, my plan was: The laceration was anesthetized with 0.1% lidocaine with epinephrine. The wound was then cleaned thoroughly with Betadine. The skin edges were then approximated with 8, 4-0 Ethilon simple interrupted sutures. The wound was then dressed with Silvadene, a nonadherent gauze, and wrapped in Coban and. Wound care was discussed. Recheck in one week for suture removal or sooner if complications arise  8/54/62 Wound is now well-healed. He is here today for suture removal. I can only find 7 sutures. I believe one suture may have torn itself free prior to today. 7 sutures are removed without difficulty.  No evidence of cellulitis. Wound is well-healed Past Medical History:  Diagnosis Date  . Allergic rhinitis   . Bronchiectasis   . CAD (coronary artery disease)   . Celiac disease   . Chronic heart failure ([place])   . Chronic kidney disease (CKD), stage III (moderate) (HCC)   . COPD (chronic obstructive pulmonary disease) ([place])   . Diverticulosis of colon (without mention of hemorrhage)   . Esophageal reflux   . Family history of adverse reaction to anesthesia    daughter gets PONV  . Family hx of colon cancer   . History of stomach ulcers 1980s  . Hyperlipemia   . Hypertension   . 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 Myocardial infarction [facility])    "previous MI/echo in 12/2015"  . Nephrolithiasis    "got them now; never had OR/scopes" (02/03/2016)  . On home oxygen therapy    "3L; 24/7" (02/21/2017)  . Other diseases of lung, not elsewhere classified   . Personal history of colonic polyps 04/26/2007   hyperplastic   . Pneumonia  "several times"   Past Surgical History:  Procedure Laterality Date  . CARDIAC CATHETERIZATION  02/03/2016  . CARDIAC CATHETERIZATION  09/30/2009   non-obstructive CAD w/30% narrowing in prox LAD (Dr. Waunita Schooner)  . CARDIAC CATHETERIZATION  05/04/2001   same at 2011 cath (Dr. Waunita Schooner)  . CARDIAC CATHETERIZATION N/A 02/03/2016   Procedure: Right/Left Heart Cath and Coronary Angiography;  Surgeon: Pixie Casino, MD;  Location: [place] [facility];  Service: Cardiovascular;  Laterality: N/A;  . EPIGLOTOPLASTY W/ MLB     removal due to carcinoma  . EXCISIONAL HEMORRHOIDECTOMY  2000s  . HAND SURGERY Right 1958   d/t crush injury  . TONSILLECTOMY AND ADENOIDECTOMY    . ULTRASOUND GUIDANCE FOR VASCULAR ACCESS  02/03/2016   Procedure: Ultrasound Guidance For Vascular Access;  Surgeon: Pixie Casino, MD;  Location: [place] [facility];  Service: Cardiovascular;;  . VASECTOMY     Current Outpatient Medications on File Prior to Visit  Medication Sig Dispense Refill  . albuterol (PROAIR HFA) 108 (90 Base) MCG/ACT inhaler INHALE 2 PUFFS EVERY 4 HOURS AS NEEDED FOR SHORTNESS OF BREATH. 2 Inhaler 5  . amiodarone (PACERONE) 200 MG tablet Take 0.5 tablets (100 mg total) by mouth daily. 15 tablet 11  . aspirin EC 81 MG tablet Take 81 mg by mouth daily.    Marland Kitchen azelastine (ASTELIN) 0.1 % nasal spray PLACE 2 SPRAYS INTO  EACH NOSTRILS TWICE DAILY AS DIRECTED. 30 mL 0  . Calcium Carbonate-Vitamin D (CALTRATE 600+D) 600-400 MG-UNIT tablet Take 1 tablet by mouth daily.    Marland Kitchen dextromethorphan-guaiFENesin (MUCINEX DM) 30-600 MG 12hr tablet Take 1 tablet by mouth 2 (two) times daily.    Marland Kitchen ENTRESTO 49-51 MG TAKE (1) TABLET BY MOUTH TWICE DAILY. 60 tablet 11  . LUTEIN PO Take 1 capsule by mouth daily.    . montelukast (SINGULAIR) 10 MG tablet TAKE 1 TABLET BY MOUTH DAILY. 90 tablet 0  . Multiple Vitamins-Minerals (CENTRUM SILVER 50+MEN) TABS Take 1 tablet by mouth daily.    . OXYGEN Inhale 3 L into the lungs  continuous. 3lpm with rest and 6 lpm with exertion    . pantoprazole (PROTONIX) 40 MG tablet TAKE ONE TABLET BY MOUTH DAILY. 90 tablet 3  . polyethylene glycol (MIRALAX / GLYCOLAX) packet Take 17 g by mouth daily as needed for mild constipation. Mix in 8 oz liquid and drink    . spironolactone (ALDACTONE) 25 MG tablet Take 0.5 tablets (12.5 mg total) by mouth daily. 16 tablet 6  . SYMBICORT 160-4.5 MCG/ACT inhaler INHALE 2 PUFFS FIRST THING IN MORNING AND THEN ANOTHER 2 PUFFS ABOUT 12 HOURS LATER. 10.2 g 11  . Tiotropium Bromide Monohydrate (SPIRIVA RESPIMAT) 2.5 MCG/ACT AERS Inhale 2 puffs into the lungs daily. 1 Inhaler 11   No current facility-administered medications on file prior to visit.    Allergies  Allergen Reactions  . Codeine Other (See Comments)    Thought head was going to blow off/ severe headache  . Adhesive [Tape] Other (See Comments)    Band-aids - tears skin off  . Gluten Meal Other (See Comments)    Celiac disease   Social History   Socioeconomic History  . Marital status: Married    Spouse name: Not on file  . Number of children: 3  . Years of education: Not on file  . Highest education level: Not on file  Social Needs  . Financial resource strain: Not on file  . Food insecurity - worry: Not on file  . Food insecurity - inability: Not on file  . Transportation needs - medical: Not on file  . Transportation needs - non-medical: Not on file  Occupational History  . 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 Smoking status: Former Smoker    Packs/day: 2.00    Years: 35.00    Pack years: 70.00    Types: Cigarettes    Last attempt to quit: 01/21/1989    Years since quitting: 28.6  . Smokeless tobacco: Former Systems developer    Types: [place] date: 08/23/2001  Substance and Sexual Activity  . Alcohol use: No  . Drug use: No  . Sexual activity: Not on file  Other Topics Concern  . Not on file  Social History  Narrative  . Not on file   .               Review of Systems  All other systems reviewed and are negative.      Objective:   Physical Exam  Cardiovascular: Normal rate, regular rhythm and normal heart sounds.  Pulmonary/Chest: Effort normal and breath sounds normal. No respiratory distress. He has no wheezes. He has no rales.  Musculoskeletal:       Hands: Vitals reviewed.         Assessment & Plan:  Suture removal 7 remaining sutures are removed without difficulty. Wound was reinforced with Steri-Strips. Wound care is discussed. Remove Steri-Strips in 1 week independently.

## 2017-09-13 ENCOUNTER — Encounter (HOSPITAL_COMMUNITY)
Admission: RE | Admit: 2017-09-13 | Discharge: 2017-09-13 | Disposition: A | Discharge status: Home / Self Care | Payer: Self-pay | Source: Ambulatory Visit | Attending: Internal Medicine | Admitting: Internal Medicine

## 2017-09-15 ENCOUNTER — Encounter (HOSPITAL_COMMUNITY)
Admission: RE | Admit: 2017-09-15 | Discharge: 2017-09-15 | Disposition: A | Discharge status: Home / Self Care | Payer: Self-pay | Source: Ambulatory Visit | Attending: Internal Medicine | Admitting: Internal Medicine

## 2017-09-20 ENCOUNTER — Encounter (HOSPITAL_COMMUNITY)
Admission: RE | Admit: 2017-09-20 | Discharge: 2017-09-20 | Disposition: A | Discharge status: Home / Self Care | Payer: Self-pay | Source: Ambulatory Visit | Attending: Internal Medicine | Admitting: Internal Medicine

## 2017-09-21 ENCOUNTER — Other Ambulatory Visit: Payer: Self-pay | Admitting: Family Medicine

## 2017-09-22 ENCOUNTER — Encounter (HOSPITAL_COMMUNITY)
Admission: RE | Admit: 2017-09-22 | Discharge: 2017-09-22 | Disposition: A | Discharge status: Home / Self Care | Payer: Self-pay | Source: Ambulatory Visit | Attending: Internal Medicine | Admitting: Internal Medicine

## 2017-09-27 ENCOUNTER — Encounter (HOSPITAL_COMMUNITY)
Admission: RE | Admit: 2017-09-27 | Discharge: 2017-09-27 | Disposition: A | Discharge status: Home / Self Care | Payer: Self-pay | Source: Ambulatory Visit | Attending: Internal Medicine | Admitting: Internal Medicine

## 2017-09-27 DIAGNOSIS — I5021 Acute systolic (congestive) heart failure: Secondary | ICD-10-CM | POA: Insufficient documentation

## 2017-09-29 ENCOUNTER — Encounter (HOSPITAL_COMMUNITY)
Admission: RE | Admit: 2017-09-29 | Discharge: 2017-09-29 | Disposition: A | Discharge status: Home / Self Care | Payer: Self-pay | Source: Ambulatory Visit | Attending: Internal Medicine | Admitting: Internal Medicine

## 2017-10-04 ENCOUNTER — Encounter (HOSPITAL_COMMUNITY)
Admission: RE | Admit: 2017-10-04 | Discharge: 2017-10-04 | Disposition: A | Discharge status: Home / Self Care | Payer: Self-pay | Source: Ambulatory Visit | Attending: Internal Medicine | Admitting: Internal Medicine

## 2017-10-06 ENCOUNTER — Encounter (HOSPITAL_COMMUNITY)
Admission: RE | Admit: 2017-10-06 | Discharge: 2017-10-06 | Disposition: A | Discharge status: Home / Self Care | Payer: Self-pay | Source: Ambulatory Visit | Attending: Internal Medicine | Admitting: Internal Medicine

## 2017-10-11 ENCOUNTER — Encounter (HOSPITAL_COMMUNITY)
Admission: RE | Admit: 2017-10-11 | Discharge: 2017-10-11 | Disposition: A | Discharge status: Home / Self Care | Payer: Self-pay | Source: Ambulatory Visit | Attending: Internal Medicine | Admitting: Internal Medicine

## 2017-10-13 ENCOUNTER — Encounter (HOSPITAL_COMMUNITY)
Admission: RE | Admit: 2017-10-13 | Discharge: 2017-10-13 | Disposition: A | Discharge status: Home / Self Care | Payer: Self-pay | Source: Ambulatory Visit | Attending: Internal Medicine | Admitting: Internal Medicine

## 2017-10-18 ENCOUNTER — Encounter (HOSPITAL_COMMUNITY): Payer: Self-pay

## 2017-10-20 ENCOUNTER — Encounter (HOSPITAL_COMMUNITY)
Admission: RE | Admit: 2017-10-20 | Discharge: 2017-10-20 | Disposition: A | Discharge status: Home / Self Care | Payer: Self-pay | Source: Ambulatory Visit | Attending: Internal Medicine | Admitting: Internal Medicine

## 2017-10-25 ENCOUNTER — Encounter (HOSPITAL_COMMUNITY)
Admission: RE | Admit: 2017-10-25 | Discharge: 2017-10-25 | Disposition: A | Discharge status: Home / Self Care | Payer: Self-pay | Source: Ambulatory Visit | Attending: Internal Medicine | Admitting: Internal Medicine

## 2017-10-25 DIAGNOSIS — I5021 Acute systolic (congestive) heart failure: Secondary | ICD-10-CM | POA: Insufficient documentation

## 2017-10-27 ENCOUNTER — Encounter (HOSPITAL_COMMUNITY)
Admission: RE | Admit: 2017-10-27 | Discharge: 2017-10-27 | Disposition: A | Discharge status: Home / Self Care | Payer: Self-pay | Source: Ambulatory Visit | Attending: Internal Medicine | Admitting: Internal Medicine

## 2017-11-01 ENCOUNTER — Encounter (HOSPITAL_COMMUNITY)
Admission: RE | Admit: 2017-11-01 | Discharge: 2017-11-01 | Disposition: A | Discharge status: Home / Self Care | Payer: Self-pay | Source: Ambulatory Visit | Attending: Internal Medicine | Admitting: Internal Medicine

## 2017-11-03 ENCOUNTER — Encounter (HOSPITAL_COMMUNITY)
Admission: RE | Admit: 2017-11-03 | Discharge: 2017-11-03 | Disposition: A | Discharge status: Home / Self Care | Payer: Self-pay | Source: Ambulatory Visit | Attending: Internal Medicine | Admitting: Internal Medicine

## 2017-11-08 ENCOUNTER — Encounter (HOSPITAL_COMMUNITY): Payer: Self-pay

## 2017-11-08 ENCOUNTER — Ambulatory Visit: Payer: Medicare Other | Admitting: Internal Medicine

## 2017-11-10 ENCOUNTER — Encounter (HOSPITAL_COMMUNITY)
Admission: RE | Admit: 2017-11-10 | Discharge: 2017-11-10 | Disposition: A | Discharge status: Home / Self Care | Payer: Self-pay | Source: Ambulatory Visit | Attending: Internal Medicine | Admitting: Internal Medicine

## 2017-11-15 ENCOUNTER — Ambulatory Visit (INDEPENDENT_AMBULATORY_CARE_PROVIDER_SITE_OTHER)
Admission: RE | Admit: 2017-11-15 | Discharge: 2017-11-15 | Disposition: A | Discharge status: Home / Self Care | Payer: Medicare Other | Source: Ambulatory Visit | Attending: Internal Medicine | Admitting: Internal Medicine

## 2017-11-15 ENCOUNTER — Encounter (HOSPITAL_COMMUNITY)
Admission: RE | Admit: 2017-11-15 | Discharge: 2017-11-15 | Disposition: A | Discharge status: Home / Self Care | Payer: Self-pay | Source: Ambulatory Visit | Attending: Internal Medicine | Admitting: Internal Medicine

## 2017-11-15 ENCOUNTER — Encounter: Payer: Self-pay | Admitting: Internal Medicine

## 2017-11-15 ENCOUNTER — Ambulatory Visit: Payer: Medicare Other | Admitting: Internal Medicine

## 2017-11-15 VITALS — BP 138/68 | HR 77 | Ht 66.0 in | Wt 129.8 lb

## 2017-11-15 DIAGNOSIS — J449 Chronic obstructive pulmonary disease, unspecified: Secondary | ICD-10-CM

## 2017-11-15 DIAGNOSIS — J9611 Chronic respiratory failure with hypoxia: Secondary | ICD-10-CM | POA: Diagnosis not present

## 2017-11-15 NOTE — Patient Instructions (Signed)
Please remember to go to the  x-ray department downstairs in the basement  for your tests - we will call you with the results when they are available.      Please schedule a follow up visit in  6 months but call sooner if needed.

## 2017-11-15 NOTE — Progress Notes (Signed)
Subjective:    Patient ID: Bruce Travis, male    DOB: 29-Jul-1935    MRN: [id number]  Brief patient profile:   26   yowm  quit smoking 1990 with GOLD II  COPD,  MPNs,  remote laryngeal cancer (? early 1990s)  with some dysphagia, allergic rhinitis, and esophageal reflux.    History of Present Illness  11/08/2011 Bruce Travis/ f/u ov on maint rx with symbicort and spiriva c/o nasal symptoms worse but  breathing fine, no limits as long as paces himself and no cough.  Main nasal issue is congestion/itching/sneezing worse x 2 weeks with purulent bloody secretions s HA/toothache  But ahs noted  year round nasal symptoms and worse x 2 years which do not correlate with breathing status Stopped nasonex due to insurance > no worse off it> has not f/u with Bruce Travis yet for ent re-eval No daytime saba need rec Augmentin 875 mg twice daily x 10days Prednisone 10 mg take  4 each am x 2 days,   2 each am x 2 days,  1 each am x2days and stop See Dr Bruce Travis in 2 weeks to let him direct you further on what to do about your chronic sinus symptoms      03/06/2013 f/u ov/Bruce Travis re GOLD II COPD/ chronic hoarse p surg (Bruce Travis)/ maint symb/ spiriva Chief Complaint  Patient presents with  . Follow-up    Pt states breathing is the same since last visit, no better or worse. Denies any new co's today.    main issue is yardwork in heat, bettter ex tol p uses saba, hfa good techniqe, never uses neb  rec Only use your albuterol (proaire) as a rescue medication Please see patient coordinator before you leave today  to schedule CT chest to compare to previous studies for your lung nodules> no change x 4 years so directed f/u indicated        04/12/2017  f/u ov/Bruce Travis re:  GOLD II copd / maint symb 160/spiriva dpi  Chief Complaint  Patient presents with  . Follow-up    Breathing is much improved since the last visit. He is attending pulmonary rehab 2 x per wk. He is using proair once per wk on average.   On 3lpm most the time/  6lpm with exertion like at rehab, doing about the same vs a year prior to OV   Back to doing some walking at [facility] behind a cart with 02 in the cart  Using proair first thing in am ? Why as supposed to use just prn  No worse sob off prednisone  rec Try spiriva respimat 2 pffs each am right behind the symbicort      07/11/2017  f/u ov/Bruce Travis re: copd II/ maint symb and spiriva smi Chief Complaint  Patient presents with  . Follow-up    Breathing is doing well. He uses his proair 1 x per wk on average.   maint rehab going well  Walks at lowe's slow pace @ 6lpm / 3lpm at rest and sleep and sats staying 90 or better most of the time  rec Please remember to go to the  x-ray department downstairs in the basement  for your tests - we will call you with the results when they are available. Please schedule a follow up visit in 4  months but call sooner if needed     11/15/2017   ov/Bruce Travis re:  GOLD II copd/ 3lpm 24/7 but 6 lpm with ex Chief Complaint  Patient  presents with  . Follow-up    SOB but Oxygen helps, doing well otherwise  doing rehab making progress and no change on 02 sats  With exertion   No obvious day to day or daytime variability or assoc excess/ purulent sputum or mucus plugs or hemoptysis or cp or chest tightness, subjective wheeze or overt sinus or hb symptoms. No unusual exposure hx or h/o childhood pna/ asthma or knowledge of premature birth.  Sleeping ok flat without nocturnal  or early am exacerbation  of respiratory  c/o's or need for noct saba. Also denies any obvious fluctuation of symptoms with weather or environmental changes or other aggravating or alleviating factors except as outlined above   Current Allergies, Complete Past Medical History, Past Surgical History, Family History, and Social History were reviewed in [employer] record.  ROS  The following are not active complaints unless bolded Hoarseness, sore throat, dysphagia, dental  problems, itching, sneezing,  nasal congestion or discharge of excess mucus or purulent secretions, ear ache,   fever, chills, sweats, unintended wt loss or wt gain, classically pleuritic or exertional cp,  orthopnea pnd or leg swelling, presyncope, palpitations, abdominal pain, anorexia, nausea, vomiting, diarrhea  or change in bowel habits or change in bladder habits, change in stools or change in urine, dysuria, hematuria,  rash, arthralgias, visual complaints, headache, numbness, weakness or ataxia or problems with walking or coordination,  change in mood/affect or memory.        Current Meds  Medication Sig  . albuterol (PROAIR HFA) 108 (90 Base) MCG/ACT inhaler INHALE 2 PUFFS EVERY 4 HOURS AS NEEDED FOR SHORTNESS OF BREATH.  Marland Kitchen amiodarone (PACERONE) 200 MG tablet Take 0.5 tablets (100 mg total) by mouth daily.  Marland Kitchen aspirin EC 81 MG tablet Take 81 mg by mouth daily.  Marland Kitchen azelastine (ASTELIN) 0.1 % nasal spray PLACE 2 SPRAYS INTO EACH NOSTRILS TWICE DAILY AS DIRECTED.  . Calcium Carbonate-Vitamin D (CALTRATE 600+D) 600-400 MG-UNIT tablet Take 1 tablet by mouth daily.  Marland Kitchen dextromethorphan-guaiFENesin (MUCINEX DM) 30-600 MG 12hr tablet Take 1 tablet by mouth 2 (two) times daily.  Marland Kitchen ENTRESTO 49-51 MG TAKE (1) TABLET BY MOUTH TWICE DAILY.  Marland Kitchen LUTEIN PO Take 1 capsule by mouth daily.  . montelukast (SINGULAIR) 10 MG tablet TAKE 1 TABLET BY MOUTH DAILY.  . Multiple Vitamins-Minerals (CENTRUM SILVER 50+MEN) TABS Take 1 tablet by mouth daily.  . OXYGEN Inhale 3 L into the lungs continuous. 3lpm with rest and 6 lpm with exertion  . pantoprazole (PROTONIX) 40 MG tablet TAKE ONE TABLET BY MOUTH DAILY.  Marland Kitchen polyethylene glycol (MIRALAX / GLYCOLAX) packet Take 17 g by mouth daily as needed for mild constipation. Mix in 8 oz liquid and drink  . spironolactone (ALDACTONE) 25 MG tablet Take 0.5 tablets (12.5 mg total) by mouth daily.  . SYMBICORT 160-4.5 MCG/ACT inhaler INHALE 2 PUFFS FIRST THING IN MORNING AND THEN  ANOTHER 2 PUFFS ABOUT 12 HOURS LATER.  . Tiotropium Bromide Monohydrate (SPIRIVA RESPIMAT) 2.5 MCG/ACT AERS Inhale 2 puffs into the lungs daily.                                 Objective:   Physical Exam  Hoarse amb  wm nad  Vital signs reviewed - Note on arrival 02 sats  90% on 3lpm     Wt  140 04/08/2011 > 06/08/2011 141 >136 09/09/2011 > 141 11/08/2011 > 03/07/2012  139 > 09/07/2012  141 > 136 03/06/2013 > 03/01/2017   128 >  04/12/2017  125 > 07/11/2017   128 > 11/15/2017  129     HEENT: nl dentition / oropharynx. Nl external ear canals without cough reflex - moderate bilateral non-specific turbinate edema     NECK :  without JVD/Nodes/TM/ nl carotid upstrokes bilaterally   LUNGS: no acc muscle use,  Mod barrel  contour chest wall with bilateral  Distant bs s audible wheeze and  without cough on insp or exp maneuver and mod   Hyperresonant  to  percussion bilaterally     CV:  RRR  no s3 or murmur or increase in P2, and no edema   ABD:  soft and nontender with pos mid insp Hoover's  in the supine position. No bruits or organomegaly appreciated, bowel sounds nl  MS:   Nl gait/  ext warm without deformities, calf tenderness, cyanosis or clubbing No obvious joint restrictions   SKIN: warm and dry without lesions    NEURO:  alert, approp, nl sensorium with  no motor or cerebellar deficits apparent.            CXR PA and Lateral:   11/15/2017 :    I personally reviewed images and agree with radiology impression as follows:    1. COPD.  No active cardiopulmonary disease.         Assessment & Plan:

## 2017-11-16 NOTE — Progress Notes (Signed)
Spoke with pt and notified of results per Dr. [name]. Pt verbalized understanding and denied any questions. 

## 2017-11-17 ENCOUNTER — Ambulatory Visit: Payer: Medicare Other | Admitting: Family Medicine

## 2017-11-17 ENCOUNTER — Encounter (HOSPITAL_COMMUNITY): Payer: Self-pay

## 2017-11-17 ENCOUNTER — Telehealth (HOSPITAL_COMMUNITY): Payer: Self-pay | Admitting: Family Medicine

## 2017-11-17 ENCOUNTER — Encounter: Payer: Self-pay | Admitting: Family Medicine

## 2017-11-17 ENCOUNTER — Other Ambulatory Visit: Payer: Self-pay

## 2017-11-17 VITALS — BP 138/82 | HR 110 | Temp 98.1°F | Resp 20 | Ht 66.0 in | Wt 129.0 lb

## 2017-11-17 DIAGNOSIS — R0602 Shortness of breath: Secondary | ICD-10-CM | POA: Diagnosis not present

## 2017-11-17 DIAGNOSIS — J441 Chronic obstructive pulmonary disease with (acute) exacerbation: Secondary | ICD-10-CM | POA: Diagnosis not present

## 2017-11-17 MED ORDER — PREDNISONE 20 MG PO TABS: 60.0000 mg | ORAL_TABLET | Freq: Every day | ORAL | 0 refills | Status: AC

## 2017-11-17 MED ORDER — METHYLPREDNISOLONE ACETATE 80 MG/ML IJ SUSP
80.0000 mg | Freq: Once | INTRAMUSCULAR | Status: AC
Start: 2017-11-17 — End: 2017-11-17
  Administered 2017-11-17: 80 mg via INTRAMUSCULAR

## 2017-11-17 MED ORDER — ALBUTEROL SULFATE (2.5 MG/3ML) 0.083% IN NEBU
2.5000 mg | INHALATION_SOLUTION | Freq: Once | RESPIRATORY_TRACT | Status: AC
  Administered 2017-11-17: 2.5 mg via RESPIRATORY_TRACT

## 2017-11-17 MED ORDER — DOXYCYCLINE HYCLATE 100 MG PO TABS: 100.0000 mg | ORAL_TABLET | Freq: Two times a day (BID) | ORAL | 0 refills | Status: DC

## 2017-11-17 MED ORDER — ALBUTEROL SULFATE (2.5 MG/3ML) 0.083% IN NEBU: 2.5000 mg | INHALATION_SOLUTION | RESPIRATORY_TRACT | 0 refills | Status: DC | PRN

## 2017-11-17 MED ORDER — IPRATROPIUM-ALBUTEROL 0.5-2.5 (3) MG/3ML IN SOLN
3.0000 mL | Freq: Once | RESPIRATORY_TRACT | Status: AC
  Administered 2017-11-17: 3 mL via RESPIRATORY_TRACT

## 2017-11-17 MED ORDER — HYDROCODONE-HOMATROPINE 5-1.5 MG/5ML PO SYRP
ORAL_SOLUTION | ORAL | 0 refills | Status: DC
Start: 2017-11-17 — End: 2018-01-27

## 2017-11-17 MED ORDER — ALPRAZOLAM 0.25 MG PO TABS: 0.2500 mg | ORAL_TABLET | Freq: Two times a day (BID) | ORAL | 0 refills | Status: DC | PRN

## 2017-11-17 NOTE — Progress Notes (Addendum)
Patient ID: Bruce Travis, male    DOB: Mar 09, 1935, 83 y.o.   MRN: [id number]  PCP: Susy Frizzle, MD  Chief Complaint  Patient presents with  . Illness    x1 day- SOB, nasal drainage, productive cough with yellow to brown colored mucus,     Subjective:   Bruce Travis is a 83 y.o. male, with PMH of COPD (O2 dependent), HTN, HLD and systolic HF, CAD, ischemic cardiomyopathy, chronic sinusitis, CKD stage 3, presents to office today with chief complaint of shortness of breath productive cough that worsened yesterday from his baseline.  He went to see pulmonologist, Dr. Melvyn Novas, 2 days ago and was doing well, noted to be at his baseline, had no medication or oxygen changes at that time.  Chest x-ray from November 15, 2017 shows COPD with no active cardiopulmonary disease, stable nodules in right lower lobe and left upper lobe.  Patient notes that over the past 2 weeks he has had worse nasal discharge, nasal congestion and sneezing.  Has coughing has worsened, however sputum production and sputum color is unchanged, moderate amount of sputum is light gray in color.   He denies hemoptysis, fever, chills, sweats palpitations, lower extremity edema, change in weight, chest pain.  He did use pro-air once this morning before coming in to be checked it helped very little.  No other alleviating factors.  Aggravating factor are exertion.      Patient Active Problem List   Diagnosis Date Noted  . Chronic respiratory failure with hypoxia ([place]) 03/02/2017  . CAD (coronary artery disease)   . Chronic kidney disease (CKD), stage III (moderate) (HCC)   . Community acquired pneumonia of left lower lobe of lung ([place]) 08/06/2016  . Heart failure ([place]) 02/03/2016  . Cardiomyopathy, ischemic 01/06/2016  . History of acute inferior wall MI 01/06/2016  . PVC's (premature ventricular contractions) 12/12/2015  . Insomnia 04/16/2015  . Nocturnal hypoxemia 03/06/2013  . Acute respiratory failure with hypoxia ([place])  01/10/2012  . Allergic rhinitis 01/10/2012  . Hypertension 01/10/2012  . COPD exacerbation ([place]) 08/11/2011  . Dysphagia 04/13/2011  . Hoarseness 04/02/2011  . Celiac disease 12/15/2010  . SINUSITIS, CHRONIC 09/10/2009  . ARTHUS PHENOMENON 10/05/2007  . Multiple pulmonary nodules determined by computed tomography of lung assoc with bronchiectasis 08/11/2007  . COPD GOLD II  07/06/2007  . GERD 07/06/2007     Prior to Admission medications   Medication Sig Start Date End Date Taking? Authorizing Provider  albuterol (PROAIR HFA) 108 (90 Base) MCG/ACT inhaler INHALE 2 PUFFS EVERY 4 HOURS AS NEEDED FOR SHORTNESS OF BREATH. 11/10/16  Yes Susy Frizzle, MD  amiodarone (PACERONE) 200 MG tablet Take 0.5 tablets (100 mg total) by mouth daily. 03/03/17  Yes Bensimhon, Shaune Pascal, MD  aspirin EC 81 MG tablet Take 81 mg by mouth daily.   Yes [provider]  azelastine (ASTELIN) 0.1 % nasal spray PLACE 2 SPRAYS INTO EACH NOSTRILS TWICE DAILY AS DIRECTED. 07/01/16  Yes Pickard, Cammie Mcgee, MD  Calcium Carbonate-Vitamin D (CALTRATE 600+D) 600-400 MG-UNIT tablet Take 1 tablet by mouth daily.   Yes [provider]  dextromethorphan-guaiFENesin (MUCINEX DM) 30-600 MG 12hr tablet Take 1 tablet by mouth 2 (two) times daily.   Yes [provider]  ENTRESTO 49-51 MG TAKE (1) TABLET BY MOUTH TWICE DAILY. 05/27/17  Yes Bensimhon, Shaune Pascal, MD  LUTEIN PO Take 1 capsule by mouth daily.   Yes [provider]  montelukast (SINGULAIR) 10  MG tablet TAKE 1 TABLET BY MOUTH DAILY. 05/02/17  Yes Susy Frizzle, MD  Multiple Vitamins-Minerals (CENTRUM SILVER 50+MEN) TABS Take 1 tablet by mouth daily.   Yes [provider]  OXYGEN Inhale 3 L into the lungs continuous. 3lpm with rest and 6 lpm with exertion   Yes [provider]  pantoprazole (PROTONIX) 40 MG tablet TAKE ONE TABLET BY MOUTH DAILY. 09/21/17  Yes Susy Frizzle, MD  polyethylene glycol (MIRALAX / GLYCOLAX)  packet Take 17 g by mouth daily as needed for mild constipation. Mix in 8 oz liquid and drink   Yes [provider]  spironolactone (ALDACTONE) 25 MG tablet Take 0.5 tablets (12.5 mg total) by mouth daily. 05/20/17  Yes Bensimhon, Shaune Pascal, MD  SYMBICORT 160-4.5 MCG/ACT inhaler INHALE 2 PUFFS FIRST THING IN MORNING AND THEN ANOTHER 2 PUFFS ABOUT 12 HOURS LATER. 01/12/17  Yes Susy Frizzle, MD  Tiotropium Bromide Monohydrate (SPIRIVA RESPIMAT) 2.5 MCG/ACT AERS Inhale 2 puffs into the lungs daily. 04/12/17  Yes Tanda Rockers, MD  albuterol (PROVENTIL) (2.5 MG/3ML) 0.083% nebulizer solution Take 3 mLs (2.5 mg total) by nebulization every 4 (four) hours as needed for wheezing or shortness of breath. 11/17/17   Delsa Grana, PA-C  doxycycline (VIBRA-TABS) 100 MG tablet Take 1 tablet (100 mg total) by mouth 2 (two) times daily. 11/17/17   Delsa Grana, PA-C  predniSONE (DELTASONE) 20 MG tablet Take 3 tablets (60 mg total) by mouth daily with breakfast for 5 days. 11/18/17 11/23/17  Delsa Grana, PA-C     Allergies  Allergen Reactions  . Codeine Other (See Comments)    Thought head was going to blow off/ severe headache  . Adhesive [Tape] Other (See Comments)    Band-aids - tears skin off  . Gluten Meal Other (See Comments)    Celiac disease     Family History  Problem Relation Age of Onset  . Heart disease Father   . Colon cancer Father   . Prostate cancer Father   . Stroke Mother   . Endometrial cancer Sister   . Stroke Maternal Grandmother   . Cancer Paternal Grandmother      Social History   Socioeconomic History  . Marital status: Married    Spouse name: Not on file  . Number of children: 3  . Years of education: Not on file  . Highest education level: Not on file  Occupational History  . 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 Financial resource strain: Not on file  . Food insecurity:    Worry: Not on file     Inability: Not on file  . Transportation needs:    Medical: Not on file    Non-medical: Not on file  Tobacco Use  . Smoking status: Former Smoker    Packs/day: 2.00    Years: 35.00    Pack years: 70.00    Types: Cigarettes    Last attempt to quit: 01/21/1989    Years since quitting: 28.8  . Smokeless tobacco: Former Systems developer    Types: [employer] date: 08/23/2001  Substance and Sexual Activity  . Alcohol use: No  . Drug use: No  . Sexual activity: Not on file  Lifestyle  . Physical activity:    Days per week: Not on file    Minutes per session: Not on file  . Stress: Not on file  Relationships  .  Social connections:    Talks on phone: Not on file    Gets together: Not on file    Attends religious service: Not on file    Active member of club or organization: Not on file    Attends meetings of clubs or organizations: Not on file    Relationship status: Not on file  . Intimate partner violence:    Fear of current or ex partner: Not on file    Emotionally abused: Not on file    Physically abused: Not on file    Forced sexual activity: Not on file  Other Topics Concern  . Not on file  Social History Narrative  . Not on file     Review of Systems  Constitutional: Negative.  Negative for activity change, appetite change, chills, diaphoresis, fatigue, fever and unexpected weight change.  HENT: Positive for congestion, postnasal drip and sneezing. Negative for ear pain, facial swelling, nosebleeds, rhinorrhea, sinus pressure, sinus pain, sore throat, trouble swallowing and voice change.   Eyes: Negative.   Respiratory: Positive for cough, chest tightness, shortness of breath and wheezing. Negative for apnea, choking and stridor.   Cardiovascular: Negative.  Negative for chest pain, palpitations and leg swelling.  Gastrointestinal: Negative.  Negative for abdominal pain.  Endocrine: Negative.   Genitourinary: Negative.   Musculoskeletal: Negative.   Skin: Negative.  Negative for  color change.  Neurological: Negative.  Negative for syncope, weakness and light-headedness.  Hematological: Negative.   Psychiatric/Behavioral: Negative.  Negative for confusion.       Objective:    Vitals:   11/17/17 0901  BP: 138/82  Pulse: 96  Resp: (!) 22  Temp: 98.1 F (36.7 C)  TempSrc: Oral  SpO2: 90%  Weight: 129 lb (58.5 kg)  Height: 5' 6"  (1.676 m)     Physical Exam  Constitutional: He is oriented to person, place, and time.  Non-toxic appearance. He does not appear ill. He appears distressed.  Well-developed, elderly male, moderately distressed, on 3 L of oxygen via nasal cannula  HENT:  Head: Normocephalic and atraumatic.  Right Ear: Tympanic membrane, external ear and ear canal normal.  Left Ear: Tympanic membrane, external ear and ear canal normal.  Nose: Rhinorrhea present. No mucosal edema. No epistaxis.  Mouth/Throat: Uvula is midline, oropharynx is clear and moist and mucous membranes are normal. No trismus in the jaw. No uvula swelling. No oropharyngeal exudate, posterior oropharyngeal edema or posterior oropharyngeal erythema.  Eyes: Pupils are equal, round, and reactive to light. Conjunctivae and lids are normal.  Neck: Trachea normal and normal range of motion. No tracheal deviation present.  Cardiovascular: Regular rhythm. Tachycardia present. Exam reveals no gallop and no friction rub.  No murmur heard. Pulses:      Radial pulses are 2+ on the right side, and 2+ on the left side.       Posterior tibial pulses are 1+ on the right side, and 1+ on the left side.  Distant heart sounds No lower extremity pitting edema  Pulmonary/Chest: Accessory muscle usage present. Tachypnea noted. He has decreased breath sounds. He has no wheezes.  Able to say a few words at a time Increased AP diameter Breath sounds diminished throughout Scattered intermittent rhonchi bilaterally at the bases No wheeze, no rales  Abdominal: Soft. Normal appearance and bowel  sounds are normal. He exhibits no distension. There is no tenderness. There is no guarding.  Musculoskeletal: Normal range of motion. He exhibits no edema.  Neurological: He is alert and  oriented to person, place, and time. Gait normal.  Skin: Skin is warm, dry and intact. Capillary refill takes less than 2 seconds. No rash noted. He is not diaphoretic. No cyanosis.  Psychiatric: He has a normal mood and affect. His speech is normal and behavior is normal.  Nursing note and vitals reviewed.           Assessment & Plan:     ICD-10-CM   1. COPD with acute exacerbation (HCC) J44.1 predniSONE (DELTASONE) 20 MG tablet    albuterol (PROVENTIL) (2.5 MG/3ML) 0.083% nebulizer solution    HYDROcodone-homatropine (HYCODAN) 5-1.5 MG/5ML syrup  2. SOB (shortness of breath) R06.02 albuterol (PROVENTIL) (2.5 MG/3ML) 0.083% nebulizer solution 2.5 mg    ipratropium-albuterol (DUONEB) 0.5-2.5 (3) MG/3ML nebulizer solution 3 mL    methylPREDNISolone acetate (DEPO-MEDROL) injection 80 mg    Recheck vitals    ipratropium-albuterol (DUONEB) 0.5-2.5 (3) MG/3ML nebulizer solution 3 mL      1. COPD with acute exacerbation ([place]) Patient presented with labored breathing, decreased pulse ox with ambulation, he was on his home O2 on 3 L.  He was given a breathing treatment and prednisone 80 mg injection, and his work of breathing somewhat decreased.  He was holding his oxygen saturation on 3 L at 90% but at rest.  Work of breathing did improve and he is able to speak in short sentences but he did continue to be mildly tachypneic, so breathing treatment was repeated and patient was reassessed by both myself and by Dr. Dennard Schaumann.  Vital signs were repeated patient was able to maintain his baseline oxygen saturation.  Patient and his wife feel comfortable discharging home with new treatments as outlined below.  They will come back tomorrow for recheck and also come back in 5 days for another recheck.  There is son is  visiting them currently and he is a physician.   Plan to do a high steroid burst for 5 days and then recheck him and initiate a taper.  Did give albuterol vials to the patient and tubing from this visit, they state that they do have a nebulizer machine at home.  They will call me if they cannot find it.  I did review with him at length appropriate use of rescue medications with an acute exacerbation.  2 days ago they did visit the pulmonologist and he was encouraging them to minimize use of albuterol.  We discussed how in the setting of acute shortness of breath with wheeze and increased work of breathing it is appropriate to do the prescribed rescue inhalers or albuterol nebulizer treatments. We also discussed appropriate ER precautions and the patient and his wife verbalize understanding. Per Dr. Samella Parr instructions, patient was prescribed a very low dose Xanax, due to a anxiety component of the shortness of breath.  I discussed the medication with the patient and his wife, discussed careful and monitored trial of this medication.  Patient requested cough medicine and I also discussed with him not to take cough medicine and this Xanax trial simultaneously.  - predniSONE (DELTASONE) 20 MG tablet; Take 3 tablets (60 mg total) by mouth daily with breakfast for 5 days.  Dispense: 15 tablet; Refill: 0 - albuterol (PROVENTIL) (2.5 MG/3ML) 0.083% nebulizer solution; Take 3 mLs (2.5 mg total) by nebulization every 4 (four) hours as needed for wheezing or shortness of breath.  Dispense: 75 mL; Refill: 0 - HYDROcodone-homatropine (HYCODAN) 5-1.5 MG/5ML syrup; Take 2.5 - 5 mLs PO Q 8 hours PRN for severe  coughing. DO NOT TAKE at the same time as XANAX.  Dispense: 120 mL; Refill: 0  2. SOB (shortness of breath)  - albuterol (PROVENTIL) (2.5 MG/3ML) 0.083% nebulizer solution 2.5 mg - ipratropium-albuterol (DUONEB) 0.5-2.5 (3) MG/3ML nebulizer solution 3 mL - methylPREDNISolone acetate (DEPO-MEDROL) injection 80  mg - Recheck vitals - ipratropium-albuterol (DUONEB) 0.5-2.5 (3) MG/3ML nebulizer solution 3 mL  Delsa Grana, PA-C 11/17/17 12:05 PM  Patient was seen in conjunction with my colleague.  Patient appears to be having a COPD exacerbation due to increasing shortness of breath and increasing sputum production although there has been very little change in sputum characteristic.  He improved after a DuoNeb and then an albuterol neb in the office.  He has responded in the past rapidly to prednisone and antibiotics.  He is in no respiratory distress after his second breathing treatment and was monitored in the office for more than 2 hours.  I recommended taking prednisone 60 mg a day for 5 days and then rechecking next week to begin tapering the patient off the prednisone.  I would supplement with antibiotics given the change in sputum with doxycycline 100 mg twice daily for 10 days.  Patient is instructed to go to the hospital if worsening.  I also believe that anxiety is playing a large role in the patient's shortness of breath coupled with a COPD exacerbation.  I recommended 0.25 mg of Xanax to be taken sparingly every 8 hours as needed for anxiety with the shortness of breath.  Patient will be rechecked tomorrow by my partner and then I will see the patient first of next week as I am going out of town.  He is to go to the hospital immediately if worse

## 2017-11-17 NOTE — Patient Instructions (Addendum)
Continue all meds from Dr. Melvyn Novas.  Will try to get you albuterol solution to use with your nebulizer at home.  You should always use your pro-air inhaler order nebulizer treatment when you become short of breath and wheezy.   Over the next several days use her nebulizer or inhaler as often as every 2 hours for shortness of breath and wheeze and should decrease to just as needed every 4-6 hours.  We would like to see you tomorrow for recheck.  And then again in 5 days for recheck and we will then adjust your steroids.  Dr. Dennard Schaumann has asked me to prescribe you a low-dose Xanax for you to try and take if you feel extremely anxious about your breathing which may be making you feel more short of breath.    If you have tried your rescue inhaler, and albuterol nebulizer treatment, and/or your Xanax prescription and you continue to feel short of breath, are unable to speak, go to the ER.    Chronic Obstructive Pulmonary Disease Exacerbation Chronic obstructive pulmonary disease (COPD) is a long-term (chronic) condition that affects the lungs. COPD is a general term that can be used to describe many different lung problems that cause lung swelling (inflammation) and limit airflow, including chronic bronchitis and emphysema. COPD exacerbations are episodes when breathing symptoms become much worse and require extra treatment. COPD exacerbations are usually caused by infections. Without treatment, COPD exacerbations can be severe and even life threatening. Frequent COPD exacerbations can cause further damage to the lungs. What are the causes? This condition may be caused by:  Respiratory infections, including viral and bacterial infections.  Exposure to smoke.  Exposure to air pollution, chemical fumes, or dust.  Things that give you an allergic reaction (allergens).  Not taking your usual COPD medicines as directed.  Underlying medical problems, such as congestive heart failure or infections not  involving the lungs.  In many cases, the cause (trigger) of this condition is not known. What increases the risk? The following factors may make you more likely to develop this condition:  Smoking cigarettes.  Old age.  Frequent prior COPD exacerbations.  What are the signs or symptoms? Symptoms of this condition include:  Increased coughing.  Increased production of mucus from your lungs (sputum).  Increased wheezing.  Increased shortness of breath.  Rapid or labored breathing.  Chest tightness.  Less energy than usual.  Sleep disruption from symptoms.  Confusion or increased sleepiness.  Often these symptoms happen or get worse even with the use of medicines. How is this diagnosed? This condition is diagnosed based on:  Your medical history.  A physical exam.  You may also have tests, including:  A chest X-ray.  Blood tests.  Lung (pulmonary) function tests.  How is this treated? Treatment for this condition depends on the severity and cause of the symptoms. You may need to be admitted to a hospital for treatment. Some of the treatments commonly used to treat COPD exacerbations are:  Antibiotic medicines. These may be used for severe exacerbations caused by a lung infection, such as pneumonia.  Bronchodilators. These are inhaled medicines that expand the air passages and allow increased airflow.  Steroid medicines. These act to reduce inflammation in the airways. They may be given with an inhaler, taken by mouth, or given through an IV tube inserted into one of your veins.  Supplemental oxygen therapy.  Airway clearing techniques, such as noninvasive ventilation (NIV) and positive expiratory pressure (PEP). These provide respiratory  support through a mask or other noninvasive device. An example of this would be using a continuous positive airway pressure (CPAP) machine to improve delivery of oxygen into your lungs.  Follow these instructions at  home: Medicines  Take over-the-counter and prescription medicines only as told by your health care provider. It is important to use correct technique with inhaled medicines.  If you were prescribed an antibiotic medicine or oral steroid, take it as told by your health care provider. Do not stop taking the medicine even if you start to feel better. Lifestyle  Eat a healthy diet.  Exercise regularly.  Get plenty of sleep.  Avoid exposure to all substances that irritate the airway, especially to tobacco smoke.  Wash your hands often with soap and water to reduce the risk of infection. If soap and water are not available, use hand sanitizer.  During flu season, avoid enclosed spaces that are crowded with people. General instructions  Drink enough fluid to keep your urine clear or pale yellow (unless you have a medical condition that requires fluid restriction).  Use a cool mist vaporizer. This humidifies the air and makes it easier for you to clear your chest when you cough.  If you have a home nebulizer and oxygen, continue to use them as told by your health care provider.  Keep all follow-up visits as told by your health care provider. This is important. How is this prevented?  Stay up-to-date on pneumococcal and influenza (flu) vaccines. A flu shot is recommended every year to help prevent exacerbations.  Do not use any products that contain nicotine or tobacco, such as cigarettes and e-cigarettes. Quitting smoking is very important in preventing COPD from getting worse and in preventing exacerbations from happening as often. If you need help quitting, ask your health care provider.  Follow all instructions for pulmonary rehabilitation after a recent exacerbation. This can help prevent future exacerbations.  Work with your health care provider to develop and follow an action plan. This tells you what steps to take when you experience certain symptoms. Contact a health care  provider if:  You have a worsening of your regular COPD symptoms. Get help right away if:  You have worsening shortness of breath, even when resting.  You have trouble talking.  You have severe chest pain.  You cough up blood.  You have a fever.  You have weakness, vomit repeatedly, or faint.  You feel confused.  You are not able to sleep because of your symptoms.  You have trouble doing daily activities. Summary  COPD exacerbations are episodes when breathing symptoms become much worse and require extra treatment above your normal treatment.  Exacerbations can be severe and even life threatening. Frequent COPD exacerbations can cause further damage to your lungs.  COPD exacerbations are usually triggered by infections such as the flu, colds, and even pneumonia.  Treatment for this condition depends on the severity and cause of the symptoms. You may need to be admitted to a hospital for treatment.  Quitting smoking is very important to prevent COPD from getting worse and to prevent exacerbations from happening as often. This information is not intended to replace advice given to you by your health care provider. Make sure you discuss any questions you have with your health care provider. Document Released: 06/06/2007 Document Revised: 09/13/2016 Document Reviewed: 09/13/2016 Elsevier Interactive Patient Education  [employer].

## 2017-11-18 ENCOUNTER — Ambulatory Visit: Payer: Medicare Other | Admitting: Family Medicine

## 2017-11-18 ENCOUNTER — Encounter: Payer: Self-pay | Admitting: Family Medicine

## 2017-11-18 VITALS — BP 118/60 | HR 87 | Temp 97.8°F | Resp 16 | Ht 66.0 in | Wt 128.2 lb

## 2017-11-18 DIAGNOSIS — J441 Chronic obstructive pulmonary disease with (acute) exacerbation: Secondary | ICD-10-CM

## 2017-11-18 NOTE — Progress Notes (Signed)
Patient ID: Bruce Travis, male    DOB: 1935/01/31, 83 y.o.   MRN: [id number]  PCP: Susy Frizzle, MD  Chief Complaint  Patient presents with  . Follow-up    Subjective:   Bruce Travis is a 83 y.o. male, presents today for 24-hour recheck of COPD exacerbation.  He reports his breathing and wheezing is significantly improved.  He is using albuterol nebulizer treatments every 4 hours.  He reports increased sputum production that continues to be gray in color and unchanged from yesterday.  He notes that he did not sleep much because of increasing cough, however he did not take the cough medicine he asked for because he wanted to "get it all up."  Other than being a little bit tired from coughing so much last night he does not have any other symptoms including chest pain, weakness, near syncope.  With his oxygen he does feel the dyspnea at rest and with exertion is improving.   Patient Active Problem List   Diagnosis Date Noted  . Chronic respiratory failure with hypoxia ([place]) 03/02/2017  . CAD (coronary artery disease)   . Chronic kidney disease (CKD), stage III (moderate) (H[facility])   . Community acquired pneumonia of left lower lobe of lung ([place]) 08/06/2016  . Heart failure ([place]) 02/03/2016  . Cardiomyopathy, ischemic 01/06/2016  . History of acute inferior wall MI 01/06/2016  . PVC's (premature ventricular contractions) 12/12/2015  . Insomnia 04/16/2015  . Nocturnal hypoxemia 03/06/2013  . Acute respiratory failure with hypoxia ([place]) 01/10/2012  . Allergic rhinitis 01/10/2012  . Hypertension 01/10/2012  . COPD exacerbation ([place]) 08/11/2011  . Dysphagia 04/13/2011  . Hoarseness 04/02/2011  . Celiac disease 12/15/2010  . SINUSITIS, CHRONIC 09/10/2009  . ARTHUS PHENOMENON 10/05/2007  . Multiple pulmonary nodules determined by computed tomography of lung assoc with bronchiectasis 08/11/2007  . COPD GOLD II  07/06/2007  . GERD 07/06/2007     Prior to Admission medications     Medication Sig Start Date End Date Taking? Authorizing Provider  albuterol (PROAIR HFA) 108 (90 Base) MCG/ACT inhaler INHALE 2 PUFFS EVERY 4 HOURS AS NEEDED FOR SHORTNESS OF BREATH. 11/10/16  Yes Susy Frizzle, MD  albuterol (PROVENTIL) (2.5 MG/3ML) 0.083% nebulizer solution Take 3 mLs (2.5 mg total) by nebulization every 4 (four) hours as needed for wheezing or shortness of breath. 11/17/17  Yes Delsa Grana, PA-C  ALPRAZolam (XANAX) 0.25 MG tablet Take 1 tablet (0.25 mg total) by mouth 2 (two) times daily as needed for anxiety. 11/17/17  Yes Delsa Grana, PA-C  amiodarone (PACERONE) 200 MG tablet Take 0.5 tablets (100 mg total) by mouth daily. 03/03/17  Yes Bensimhon, Shaune Pascal, MD  aspirin EC 81 MG tablet Take 81 mg by mouth daily.   Yes [provider]  azelastine (ASTELIN) 0.1 % nasal spray PLACE 2 SPRAYS INTO EACH NOSTRILS TWICE DAILY AS DIRECTED. 07/01/16  Yes Pickard, Cammie Mcgee, MD  Calcium Carbonate-Vitamin D (CALTRATE 600+D) 600-400 MG-UNIT tablet Take 1 tablet by mouth daily.   Yes [provider]  dextromethorphan-guaiFENesin (MUCINEX DM) 30-600 MG 12hr tablet Take 1 tablet by mouth 2 (two) times daily.   Yes [provider]  doxycycline (VIBRA-TABS) 100 MG tablet Take 1 tablet (100 mg total) by mouth 2 (two) times daily. 11/17/17  Yes Dewain Platz, PA-C  ENTRESTO 49-51 MG TAKE (1) TABLET BY MOUTH TWICE DAILY. 05/27/17  Yes Bensimhon, Shaune Pascal, MD  HYDROcodone-homatropine [facility]) 5-1.5 MG/5ML syrup Take 2.5 - 5  mLs PO Q 8 hours PRN for severe coughing. DO NOT TAKE at the same time as XANAX. 11/17/17  Yes Delsa Grana, PA-C  LUTEIN PO Take 1 capsule by mouth daily.   Yes [provider]  montelukast (SINGULAIR) 10 MG tablet TAKE 1 TABLET BY MOUTH DAILY. 05/02/17  Yes Susy Frizzle, MD  Multiple Vitamins-Minerals (CENTRUM SILVER 50+MEN) TABS Take 1 tablet by mouth daily.   Yes [provider]  OXYGEN Inhale 3 L into the lungs continuous. 3lpm  with rest and 6 lpm with exertion   Yes [provider]  pantoprazole (PROTONIX) 40 MG tablet TAKE ONE TABLET BY MOUTH DAILY. 09/21/17  Yes Susy Frizzle, MD  polyethylene glycol (MIRALAX / GLYCOLAX) packet Take 17 g by mouth daily as needed for mild constipation. Mix in 8 oz liquid and drink   Yes [provider]  predniSONE (DELTASONE) 20 MG tablet Take 3 tablets (60 mg total) by mouth daily with breakfast for 5 days. 11/18/17 11/23/17 Yes Delsa Grana, PA-C  spironolactone (ALDACTONE) 25 MG tablet Take 0.5 tablets (12.5 mg total) by mouth daily. 05/20/17  Yes Bensimhon, Shaune Pascal, MD  SYMBICORT 160-4.5 MCG/ACT inhaler INHALE 2 PUFFS FIRST THING IN MORNING AND THEN ANOTHER 2 PUFFS ABOUT 12 HOURS LATER. 01/12/17  Yes Susy Frizzle, MD  Tiotropium Bromide Monohydrate (SPIRIVA RESPIMAT) 2.5 MCG/ACT AERS Inhale 2 puffs into the lungs daily. 04/12/17  Yes Tanda Rockers, MD     Allergies  Allergen Reactions  . Codeine Other (See Comments)    Thought head was going to blow off/ severe headache  . Adhesive [Tape] Other (See Comments)    Band-aids - tears skin off  . Gluten Meal Other (See Comments)    Celiac disease     Family History  Problem Relation Age of Onset  . Heart disease Father   . Colon cancer Father   . Prostate cancer Father   . Stroke Mother   . Endometrial cancer Sister   . Stroke Maternal Grandmother   . Cancer Paternal Grandmother      Social History   Socioeconomic History  . Marital status: Married    Spouse name: Not on file  . Number of children: 3  . Years of education: Not on file  . Highest education level: Not on file  Occupational History  . 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 Financial resource strain: Not on file  . Food insecurity:    Worry: Not on file    Inability: Not on file  . Transportation needs:    Medical: Not on file    Non-medical: Not on file  Tobacco  Use  . Smoking status: Former Smoker    Packs/day: 2.00    Years: 35.00    Pack years: 70.00    Types: Cigarettes    Last attempt to quit: 01/21/1989    Years since quitting: 28.8  . Smokeless tobacco: Former Systems developer    Types: [place] date: 08/23/2001  Substance and Sexual Activity  . Alcohol use: No  . Drug use: No  . Sexual activity: Not on file  Lifestyle  . Physical activity:    Days per week: Not on file    Minutes per session: Not on file  . Stress: Not on file  Relationships  . Social connections:    Talks on phone: Not on file  Gets together: Not on file    Attends religious service: Not on file    Active member of club or organization: Not on file    Attends meetings of clubs or organizations: Not on file    Relationship status: Not on file  . Intimate partner violence:    Fear of current or ex partner: Not on file    Emotionally abused: Not on file    Physically abused: Not on file    Forced sexual activity: Not on file  Other Topics Concern  . Not on file  Social History Narrative  . Not on file     Review of Systems 10 Systems reviewed and are negative for acute change except as noted in the HPI.     Objective:    Vitals:   11/18/17 0838  BP: 118/60  Pulse: 87  Resp: 16  Temp: 97.8 F (36.6 C)  TempSrc: Oral  SpO2: 90%  Weight: 128 lb 3.2 oz (58.2 kg)  Height: 5' 6"  (1.676 m)     Physical Exam  Constitutional: He appears well-developed.  Non-toxic appearance. He does not appear ill. No distress.  Well-developed, elderly male, appears somewhat tired but alert, pleasant and talkative, on 3 L of oxygen via nasal cannula  HENT:  Head: Normocephalic and atraumatic.  Right Ear: Tympanic membrane, external ear and ear canal normal.  Left Ear: Tympanic membrane, external ear and ear canal normal.  Nose: No mucosal edema or rhinorrhea.  Mouth/Throat: Uvula is midline, oropharynx is clear and moist and mucous membranes are normal. No trismus in  the jaw. No uvula swelling. No oropharyngeal exudate, posterior oropharyngeal edema or posterior oropharyngeal erythema.  Eyes: Conjunctivae and lids are normal.  Neck: Trachea normal and normal range of motion. No tracheal deviation present.  Cardiovascular: Normal rate, regular rhythm and intact distal pulses. Exam reveals no gallop and no friction rub.  No murmur heard. Pulses:      Radial pulses are 2+ on the right side, and 2+ on the left side.       Posterior tibial pulses are 1+ on the right side, and 1+ on the left side.  Distant heart sounds No lower extremity pitting edema  Pulmonary/Chest: Accessory muscle usage present. Tachypnea noted. He has decreased breath sounds. He has no wheezes.  Able to speak in full sentences, patient wearing nasal cannula and 3 L oxygen, no respiratory distress Increased AP diameter, no retractions, no accessory muscle use. Clear to auscultation anteriorly and posteriorly, no wheeze, rales, rhonchi.  Abdominal: Soft. Normal appearance and bowel sounds are normal. He exhibits no distension. There is no tenderness. There is no guarding.  Musculoskeletal: Normal range of motion.  Neurological: He is alert. He exhibits normal muscle tone. Coordination and gait normal.  Skin: Skin is warm, dry and intact. Capillary refill takes less than 2 seconds. No rash noted. He is not diaphoretic. No cyanosis.  Psychiatric: He has a normal mood and affect. His speech is normal and behavior is normal.  Nursing note and vitals reviewed.           Assessment & Plan:    1. COPD with acute exacerbation ([place]) Recheck of Mr. Schroeter after initiating treatment yesterday for acute exacerbation of COPD.  His vital signs were improved today with a respiratory rate of 16, respirations unlabored with no retractions.  He continues to have frequent cough.  He reports increased sputum production last night that continues to be gray in color without any hemoptysis.  His shortness of  breath is better with albuterol nebulizer treatments roughly every 4 hours.  I instructed him to decrease this as needed for shortness of breath.  He did not take the cough medicine last night and he did appear tired.  He will try the cough medicine tonight to try and get some sleep.   He is on antibiotics.  I did discuss with him signs and symptoms of worsening infection, such as sweats, increased fatigue, confusion.  Also discussed this with his wife.  He may not become febrile so we discussed other warning signs.  Patient encouraged to reach out to on-call provider over the weekend if he has any concerns and/or to go to the ER with worsening shortness of breath is not relieved or with any other concerning signs or symptoms of worsening.  He is able to ambulate slowly and without any respiratory distress out of the clinic, RR improved today and is 16.   He will follow up with Dr. Dennard Schaumann in 4 days.    Delsa Grana, PA-C 11/18/17 11:26 AM

## 2017-11-18 NOTE — Patient Instructions (Signed)
Continue medications as prescribed yesterday, add your cough medicine at night see if can get some sleep.  We will see you for follow-up next Tuesday with Dr. Dennard Schaumann.

## 2017-11-21 ENCOUNTER — Encounter: Payer: Self-pay | Admitting: Internal Medicine

## 2017-11-21 NOTE — Assessment & Plan Note (Signed)
-   PFT's 06/08/11  FEV1  1.71 (67%) ratio 37 and no better p B2 with DLC0 54% -  PFT's  04/12/2017  FEV1 1.62  (65 % ) ratio 43  p 6 % improvement from saba p spiriva  prior to study with DLCO  24/23 % corrects to 27  % for alv volume   - 04/12/2017  After extensive coaching device effectiveness =    75% so changed to spiriva smi     - 11/15/2017  After extensive coaching inhaler device  effectiveness =    90%     Group D in terms of symptom/risk and laba/lama/ICS  therefore appropriate rx at this point > continue symb 160/ spiriva 2 each am and reviewed approp use of saba   Each maintenance medication was reviewed in detail including most importantly the difference between maintenance and as needed and under what circumstances the prns are to be used.  Please see AVS for specific  Instructions which are unique to this visit and I personally typed out  which were reviewed in detail in writing with the patient and a copy provided.

## 2017-11-21 NOTE — Assessment & Plan Note (Addendum)
Adequate control on present rx, reviewed in detail with pt > no change in rx needed  = 3lpm 24/7  But 6lpm with exertion and monitor sats at rehab to be sure keep > 90%  As on amiodarone

## 2017-11-22 ENCOUNTER — Encounter: Payer: Self-pay | Admitting: Family Medicine

## 2017-11-22 ENCOUNTER — Ambulatory Visit: Payer: Medicare Other | Admitting: Family Medicine

## 2017-11-22 ENCOUNTER — Encounter (HOSPITAL_COMMUNITY): Payer: Self-pay

## 2017-11-22 VITALS — BP 140/70 | HR 82 | Temp 97.2°F | Resp 24 | Ht 66.5 in | Wt 130.0 lb

## 2017-11-22 DIAGNOSIS — J441 Chronic obstructive pulmonary disease with (acute) exacerbation: Secondary | ICD-10-CM | POA: Diagnosis not present

## 2017-11-22 DIAGNOSIS — I5021 Acute systolic (congestive) heart failure: Secondary | ICD-10-CM | POA: Insufficient documentation

## 2017-11-22 MED ORDER — PREDNISONE 20 MG PO TABS: 40.0000 mg | ORAL_TABLET | Freq: Every day | ORAL | 0 refills | Status: DC

## 2017-11-22 NOTE — Progress Notes (Signed)
Subjective:    Patient ID: Bruce Travis, male    DOB: 09/05/1934, 83 y.o.   MRN: [id number]  HPI  Today as a follow-up from last week.  His breathing is definitely better.  He is on his last dose of prednisone 60 mg today.  He continues to have a cough productive of thick gray sputum which is different in characteristic from his baseline.  He also continues to endorse increased sputum production however his shortness of breath has improved dramatically.  He denies any fevers or hemoptysis.  There is no tachypnea.  He is breathing relaxed this morning.  His breath sounds have improved and he has increased air movement Past Medical History:  Diagnosis Date  . Allergic rhinitis   . Bronchiectasis   . CAD (coronary artery disease)   . Celiac disease   . Chronic heart failure ([place])   . Chronic kidney disease (CKD), stage III (moderate) (HCC)   . COPD (chronic obstructive pulmonary disease) ([place])   . Diverticulosis of colon (without mention of hemorrhage)   . Esophageal reflux   . Family history of adverse reaction to anesthesia    daughter gets PONV  . Family hx of colon cancer   . History of stomach ulcers 1980s  . Hyperlipemia   . Hypertension   . 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 Myocardial infarction [facility])    "previous MI/echo in 12/2015"  . Nephrolithiasis    "got them now; never had OR/scopes" (02/03/2016)  . On home oxygen therapy    "3L; 24/7" (02/21/2017)  . Other diseases of lung, not elsewhere classified   . Personal history of colonic polyps 04/26/2007   hyperplastic   . Pneumonia "several times"   Past Surgical History:  Procedure Laterality Date  . CARDIAC CATHETERIZATION  02/03/2016  . CARDIAC CATHETERIZATION  09/30/2009   non-obstructive CAD w/30% narrowing in prox LAD (Dr. Waunita Schooner)  . CARDIAC CATHETERIZATION  05/04/2001   same at 2011 cath (Dr. Waunita Schooner)  . CARDIAC CATHETERIZATION N/A 02/03/2016   Procedure: Right/Left Heart Cath and  Coronary Angiography;  Surgeon: Pixie Casino, MD;  Location: [place] [facility];  Service: Cardiovascular;  Laterality: N/A;  . EPIGLOTOPLASTY W/ MLB     removal due to carcinoma  . EXCISIONAL HEMORRHOIDECTOMY  2000s  . HAND SURGERY Right 1958   d/t crush injury  . TONSILLECTOMY AND ADENOIDECTOMY    . ULTRASOUND GUIDANCE FOR VASCULAR ACCESS  02/03/2016   Procedure: Ultrasound Guidance For Vascular Access;  Surgeon: Pixie Casino, MD;  Location: [place] [facility];  Service: Cardiovascular;;  . VASECTOMY     Current Outpatient Medications on File Prior to Visit  Medication Sig Dispense Refill  . albuterol (PROAIR HFA) 108 (90 Base) MCG/ACT inhaler INHALE 2 PUFFS EVERY 4 HOURS AS NEEDED FOR SHORTNESS OF BREATH. 2 Inhaler 5  . albuterol (PROVENTIL) (2.5 MG/3ML) 0.083% nebulizer solution Take 3 mLs (2.5 mg total) by nebulization every 4 (four) hours as needed for wheezing or shortness of breath. 75 mL 0  . ALPRAZolam (XANAX) 0.25 MG tablet Take 1 tablet (0.25 mg total) by mouth 2 (two) times daily as needed for anxiety. 20 tablet 0  . amiodarone (PACERONE) 200 MG tablet Take 0.5 tablets (100 mg total) by mouth daily. 15 tablet 11  . aspirin EC 81 MG tablet Take 81 mg by mouth daily.    Marland Kitchen azelastine (ASTELIN) 0.1 % nasal spray PLACE 2  SPRAYS INTO EACH NOSTRILS TWICE DAILY AS DIRECTED. 30 mL 0  . Calcium Carbonate-Vitamin D (CALTRATE 600+D) 600-400 MG-UNIT tablet Take 1 tablet by mouth daily.    Marland Kitchen dextromethorphan-guaiFENesin (MUCINEX DM) 30-600 MG 12hr tablet Take 1 tablet by mouth 2 (two) times daily.    Marland Kitchen doxycycline (VIBRA-TABS) 100 MG tablet Take 1 tablet (100 mg total) by mouth 2 (two) times daily. 20 tablet 0  . ENTRESTO 49-51 MG TAKE (1) TABLET BY MOUTH TWICE DAILY. 60 tablet 11  . HYDROcodone-homatropine (HYCODAN) 5-1.5 MG/5ML syrup Take 2.5 - 5 mLs PO Q 8 hours PRN for severe coughing. DO NOT TAKE at the same time as XANAX. 120 mL 0  . LUTEIN PO Take 1 capsule by mouth daily.    .  montelukast (SINGULAIR) 10 MG tablet TAKE 1 TABLET BY MOUTH DAILY. 90 tablet 0  . Multiple Vitamins-Minerals (CENTRUM SILVER 50+MEN) TABS Take 1 tablet by mouth daily.    . OXYGEN Inhale 3 L into the lungs continuous. 3lpm with rest and 6 lpm with exertion    . pantoprazole (PROTONIX) 40 MG tablet TAKE ONE TABLET BY MOUTH DAILY. 90 tablet 3  . polyethylene glycol (MIRALAX / GLYCOLAX) packet Take 17 g by mouth daily as needed for mild constipation. Mix in 8 oz liquid and drink    . predniSONE (DELTASONE) 20 MG tablet Take 3 tablets (60 mg total) by mouth daily with breakfast for 5 days. 15 tablet 0  . spironolactone (ALDACTONE) 25 MG tablet Take 0.5 tablets (12.5 mg total) by mouth daily. 16 tablet 6  . SYMBICORT 160-4.5 MCG/ACT inhaler INHALE 2 PUFFS FIRST THING IN MORNING AND THEN ANOTHER 2 PUFFS ABOUT 12 HOURS LATER. 10.2 g 11  . Tiotropium Bromide Monohydrate (SPIRIVA RESPIMAT) 2.5 MCG/ACT AERS Inhale 2 puffs into the lungs daily. 1 Inhaler 11   No current facility-administered medications on file prior to visit.    Allergies  Allergen Reactions  . Codeine Other (See Comments)    Thought head was going to blow off/ severe headache  . Adhesive [Tape] Other (See Comments)    Band-aids - tears skin off  . Gluten Meal Other (See Comments)    Celiac disease   Social History   Socioeconomic History  . Marital status: Married    Spouse name: Not on file  . Number of children: 3  . Years of education: Not on file  . Highest education level: Not on file  Occupational History  . 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 Financial resource strain: Not on file  . Food insecurity:    Worry: Not on file    Inability: Not on file  . Transportation needs:    Medical: Not on file    Non-medical: Not on file  Tobacco Use  . Smoking status: Former Smoker    Packs/day: 2.00    Years: 35.00    Pack years: 70.00    Types: Cigarettes     Last attempt to quit: 01/21/1989    Years since quitting: 28.8  . Smokeless tobacco: Former Systems developer    Types: [place] date: 08/23/2001  Substance and Sexual Activity  . Alcohol use: No  . Drug use: No  . Sexual activity: Not on file  Lifestyle  . Physical activity:    Days per week: Not on file    Minutes per session: Not on file  . Stress:  Not on file  Relationships  . Social connections:    Talks on phone: Not on file    Gets together: Not on file    Attends religious service: Not on file    Active member of club or organization: Not on file    Attends meetings of clubs or organizations: Not on file    Relationship status: Not on file  . Intimate partner violence:    Fear of current or ex partner: Not on file    Emotionally abused: Not on file    Physically abused: Not on file    Forced sexual activity: Not on file  Other Topics Concern  . Not on file  Social History Narrative  . Not on file      Review of Systems  All other systems reviewed and are negative.      Objective:   Physical Exam  HENT:  Right Ear: Tympanic membrane and ear canal normal.  Left Ear: Tympanic membrane and ear canal normal.  Nose: No mucosal edema or rhinorrhea.  Cardiovascular: Normal rate, regular rhythm and normal heart sounds.  No murmur heard. Pulmonary/Chest: He has decreased breath sounds. He has wheezes. He has no rhonchi. He has no rales. He exhibits no tenderness.  Vitals reviewed.        Assessment & Plan:  COPD with acute exacerbation [facility])  Patient is clinically improving.  Wean the patient gradually off prednisone.  I have attempted abrupt discontinuation of prednisone in the past and the patient has experienced rebound.  Therefore I would decrease him to 40 mg of prednisone a day for 4-5 days and then decrease to 20 mg of prednisone for 4-5 days then discontinue the medication altogether.  Decrease the frequency of albuterol nebulizer treatments to as needed.  He has  been doing them every 4-6 hours.  Complete the antibiotics as prescribed.  I also encouraged the patient to use Xanax when he feels short of breath as I do believe there is a component of air hunger and anxiety contributing to his perceived dyspnea

## 2017-11-24 ENCOUNTER — Ambulatory Visit: Payer: Medicare Other | Admitting: Family Medicine

## 2017-11-24 ENCOUNTER — Encounter (HOSPITAL_COMMUNITY): Payer: Self-pay

## 2017-11-29 ENCOUNTER — Ambulatory Visit: Payer: Medicare Other | Admitting: Family Medicine

## 2017-11-29 ENCOUNTER — Encounter (HOSPITAL_COMMUNITY): Payer: Self-pay

## 2017-11-29 ENCOUNTER — Encounter: Payer: Self-pay | Admitting: Family Medicine

## 2017-11-29 VITALS — BP 132/78 | HR 80 | Temp 97.7°F | Resp 20 | Ht 66.5 in | Wt 128.0 lb

## 2017-11-29 DIAGNOSIS — N410 Acute prostatitis: Secondary | ICD-10-CM

## 2017-11-29 LAB — URINALYSIS, ROUTINE W REFLEX MICROSCOPIC
Bilirubin Urine: NEGATIVE
Glucose, UA: NEGATIVE
Hgb urine dipstick: NEGATIVE
KETONES UR: NEGATIVE
LEUKOCYTES UA: NEGATIVE
NITRITE: NEGATIVE
PH: 6 (ref 5.0–8.0)
Protein, ur: NEGATIVE
SPECIFIC GRAVITY, URINE: 1.004 (ref 1.001–1.03)

## 2017-11-29 MED ORDER — TAMSULOSIN HCL 0.4 MG PO CAPS: 0.4000 mg | ORAL_CAPSULE | Freq: Every day | ORAL | 3 refills | Status: DC

## 2017-11-29 MED ORDER — SULFAMETHOXAZOLE-TRIMETHOPRIM 800-160 MG PO TABS: 1.0000 | ORAL_TABLET | Freq: Two times a day (BID) | ORAL | 0 refills | Status: DC

## 2017-11-29 NOTE — Progress Notes (Signed)
Subjective:    Patient ID: Bruce Travis, male    DOB: [birth year], 83 y.o.   MRN: [id number]  HPI  11/22/17 Here today as a follow-up from last week.  His breathing is definitely better.  He is on his last dose of prednisone 60 mg today.  He continues to have a cough productive of thick gray sputum which is different in characteristic from his baseline.  He also continues to endorse increased sputum production however his shortness of breath has improved dramatically.  He denies any fevers or hemoptysis.  There is no tachypnea.  He is breathing relaxed this morning.  His breath sounds have improved and he has increased air movement.  At that time, my plan was: Patient is clinically improving.  Wean the patient gradually off prednisone.  I have attempted abrupt discontinuation of prednisone in the past and the patient has experienced rebound.  Therefore I would decrease him to 40 mg of prednisone a day for 4-5 days and then decrease to 20 mg of prednisone for 4-5 days then discontinue the medication altogether.  Decrease the frequency of albuterol nebulizer treatments to as needed.  He has been doing them every 4-6 hours.  Complete the antibiotics as prescribed.  I also encouraged the patient to use Xanax when he feels short of breath as I do believe there is a component of air hunger and anxiety contributing to his perceived dyspnea  11/29/17 Patient states that 3-4 days ago, he started noticing difficulty urinating.  He feels the need to urinate however he reports weak urinary stream.  He also reports dysuria at the end of urination.  He reports incomplete bladder emptying/evacuation.  He also reports mild pain in his lower abdomen near the area of his bladder.  On exam today, his prostate is mildly swollen and is tender to palpation.  There are no nodules. Past Medical History:  Diagnosis Date  . Allergic rhinitis   . Bronchiectasis   . CAD (coronary artery disease)   . Celiac disease   . Chronic heart  failure ([place])   . Chronic kidney disease (CKD), stage III (moderate) (HCC)   . COPD (chronic obstructive pulmonary disease) ([place])   . Diverticulosis of colon (without mention of hemorrhage)   . Esophageal reflux   . Family history of adverse reaction to anesthesia    daughter gets PONV  . Family hx of colon cancer   . History of stomach ulcers 1980s  . Hyperlipemia   . Hypertension   . 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 Myocardial infarction [facility])    "previous MI/echo in 12/2015"  . Nephrolithiasis    "got them now; never had OR/scopes" (02/03/2016)  . On home oxygen therapy    "3L; 24/7" (02/21/2017)  . Other diseases of lung, not elsewhere classified   . Personal history of colonic polyps 04/26/2007   hyperplastic   . Pneumonia "several times"   Past Surgical History:  Procedure Laterality Date  . CARDIAC CATHETERIZATION  02/03/2016  . CARDIAC CATHETERIZATION  09/30/2009   non-obstructive CAD w/30% narrowing in prox LAD (Dr. Waunita Schooner)  . CARDIAC CATHETERIZATION  05/04/2001   same at 2011 cath (Dr. Waunita Schooner)  . CARDIAC CATHETERIZATION N/A 02/03/2016   Procedure: Right/Left Heart Cath and Coronary Angiography;  Surgeon: Pixie Casino, MD;  Location: [place] [facility];  Service: Cardiovascular;  Laterality: N/A;  . EPIGLOTOPLASTY W/ MLB     removal due  to carcinoma  . EXCISIONAL HEMORRHOIDECTOMY  2000s  . HAND SURGERY Right 1958   d/t crush injury  . TONSILLECTOMY AND ADENOIDECTOMY    . ULTRASOUND GUIDANCE FOR VASCULAR ACCESS  02/03/2016   Procedure: Ultrasound Guidance For Vascular Access;  Surgeon: Pixie Casino, MD;  Location: [place] [facility];  Service: Cardiovascular;;  . VASECTOMY     Current Outpatient Medications on File Prior to Visit  Medication Sig Dispense Refill  . albuterol (PROAIR HFA) 108 (90 Base) MCG/ACT inhaler INHALE 2 PUFFS EVERY 4 HOURS AS NEEDED FOR SHORTNESS OF BREATH. 2 Inhaler 5  . albuterol (PROVENTIL) (2.5 MG/3ML)  0.083% nebulizer solution Take 3 mLs (2.5 mg total) by nebulization every 4 (four) hours as needed for wheezing or shortness of breath. 75 mL 0  . ALPRAZolam (XANAX) 0.25 MG tablet Take 1 tablet (0.25 mg total) by mouth 2 (two) times daily as needed for anxiety. 20 tablet 0  . amiodarone (PACERONE) 200 MG tablet Take 0.5 tablets (100 mg total) by mouth daily. 15 tablet 11  . aspirin EC 81 MG tablet Take 81 mg by mouth daily.    Marland Kitchen azelastine (ASTELIN) 0.1 % nasal spray PLACE 2 SPRAYS INTO EACH NOSTRILS TWICE DAILY AS DIRECTED. 30 mL 0  . Calcium Carbonate-Vitamin D (CALTRATE 600+D) 600-400 MG-UNIT tablet Take 1 tablet by mouth daily.    Marland Kitchen dextromethorphan-guaiFENesin (MUCINEX DM) 30-600 MG 12hr tablet Take 1 tablet by mouth 2 (two) times daily.    Marland Kitchen ENTRESTO 49-51 MG TAKE (1) TABLET BY MOUTH TWICE DAILY. 60 tablet 11  . HYDROcodone-homatropine (HYCODAN) 5-1.5 MG/5ML syrup Take 2.5 - 5 mLs PO Q 8 hours PRN for severe coughing. DO NOT TAKE at the same time as XANAX. 120 mL 0  . LUTEIN PO Take 1 capsule by mouth daily.    . montelukast (SINGULAIR) 10 MG tablet TAKE 1 TABLET BY MOUTH DAILY. 90 tablet 0  . Multiple Vitamins-Minerals (CENTRUM SILVER 50+MEN) TABS Take 1 tablet by mouth daily.    . OXYGEN Inhale 3 L into the lungs continuous. 3lpm with rest and 6 lpm with exertion    . pantoprazole (PROTONIX) 40 MG tablet TAKE ONE TABLET BY MOUTH DAILY. 90 tablet 3  . polyethylene glycol (MIRALAX / GLYCOLAX) packet Take 17 g by mouth daily as needed for mild constipation. Mix in 8 oz liquid and drink    . predniSONE (DELTASONE) 20 MG tablet Take 2 tablets (40 mg total) by mouth daily with breakfast. 21 tablet 0  . spironolactone (ALDACTONE) 25 MG tablet Take 0.5 tablets (12.5 mg total) by mouth daily. 16 tablet 6  . SYMBICORT 160-4.5 MCG/ACT inhaler INHALE 2 PUFFS FIRST THING IN MORNING AND THEN ANOTHER 2 PUFFS ABOUT 12 HOURS LATER. 10.2 g 11  . Tiotropium Bromide Monohydrate (SPIRIVA RESPIMAT) 2.5 MCG/ACT  AERS Inhale 2 puffs into the lungs daily. 1 Inhaler 11   No current facility-administered medications on file prior to visit.    Allergies  Allergen Reactions  . Codeine Other (See Comments)    Thought head was going to blow off/ severe headache  . Adhesive [Tape] Other (See Comments)    Band-aids - tears skin off  . Gluten Meal Other (See Comments)    Celiac disease   Social History   Socioeconomic History  . Marital status: Married    Spouse name: Not on file  . Number of children: 3  . Years of education: Not on file  . Highest education level: Not  on file  Occupational History  . 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 Financial resource strain: Not on file  . Food insecurity:    Worry: Not on file    Inability: Not on file  . Transportation needs:    Medical: Not on file    Non-medical: Not on file  Tobacco Use  . Smoking status: Former Smoker    Packs/day: 2.00    Years: 35.00    Pack years: 70.00    Types: Cigarettes    Last attempt to quit: 01/21/1989    Years since quitting: 28.8  . Smokeless tobacco: Former Systems developer    Types: [place] date: 08/23/2001  Substance and Sexual Activity  . Alcohol use: No  . Drug use: No  . Sexual activity: Not on file  Lifestyle  . Physical activity:    Days per week: Not on file    Minutes per session: Not on file  . Stress: Not on file  Relationships  . Social connections:    Talks on phone: Not on file    Gets together: Not on file    Attends religious service: Not on file    Active member of club or organization: Not on file    Attends meetings of clubs or organizations: Not on file    Relationship status: Not on file  . Intimate partner violence:    Fear of current or ex partner: Not on file    Emotionally abused: Not on file    Physically abused: Not on file    Forced sexual activity: Not on file  Other Topics Concern  . Not on file  Social History  Narrative  . Not on file      Review of Systems  All other systems reviewed and are negative.      Objective:   Physical Exam  HENT:  Right Ear: Tympanic membrane and ear canal normal.  Left Ear: Tympanic membrane and ear canal normal.  Nose: No mucosal edema or rhinorrhea.  Cardiovascular: Normal rate, regular rhythm and normal heart sounds.  No murmur heard. Pulmonary/Chest: He has decreased breath sounds. He has wheezes. He has no rhonchi. He has no rales. He exhibits no tenderness.  Genitourinary: Prostate is enlarged and tender.  Vitals reviewed. Prostate is symmetrically swollen and tender to palpation.  I suspect the patient has prostatitis       Assessment & Plan:   Acute prostatitis - Plan: Urinalysis, Routine w reflex microscopic, PSA  Given the fact the patient is on amiodarone, he cannot tolerate Cipro.  This increases the risk of QTC prolongation.  Therefore I recommended Bactrim double strength tablets 1 p.o. twice daily for 10 days then reassess.  I would also add Flomax 0.4 mg p.o. nightly to assist with urinary retention.  Recheck if no better in 48 hours or sooner if worse

## 2017-11-30 LAB — PSA: PSA: 1.2 ng/mL (ref <=4.0)

## 2017-11-30 LAB — BASIC METABOLIC PANEL

## 2017-12-01 ENCOUNTER — Encounter (HOSPITAL_COMMUNITY)
Admission: RE | Admit: 2017-12-01 | Discharge: 2017-12-01 | Disposition: A | Discharge status: Home / Self Care | Payer: Self-pay | Source: Ambulatory Visit | Attending: Internal Medicine | Admitting: Internal Medicine

## 2017-12-01 ENCOUNTER — Encounter: Payer: Self-pay | Admitting: *Deleted

## 2017-12-06 ENCOUNTER — Encounter (HOSPITAL_COMMUNITY): Payer: Self-pay

## 2017-12-07 ENCOUNTER — Encounter (HOSPITAL_COMMUNITY): Payer: Self-pay | Admitting: Internal Medicine

## 2017-12-07 ENCOUNTER — Other Ambulatory Visit: Payer: Self-pay

## 2017-12-07 ENCOUNTER — Telehealth (HOSPITAL_COMMUNITY): Payer: Self-pay | Admitting: Family Medicine

## 2017-12-07 ENCOUNTER — Ambulatory Visit (HOSPITAL_COMMUNITY)
Admission: RE | Admit: 2017-12-07 | Discharge: 2017-12-07 | Disposition: A | Discharge status: Home / Self Care | Payer: Medicare Other | Source: Ambulatory Visit | Attending: Internal Medicine | Admitting: Internal Medicine

## 2017-12-07 VITALS — BP 118/54 | HR 82 | Wt 127.5 lb

## 2017-12-07 DIAGNOSIS — J449 Chronic obstructive pulmonary disease, unspecified: Secondary | ICD-10-CM | POA: Diagnosis not present

## 2017-12-07 DIAGNOSIS — J9610 Chronic respiratory failure, unspecified whether with hypoxia or hypercapnia: Secondary | ICD-10-CM | POA: Insufficient documentation

## 2017-12-07 DIAGNOSIS — Z79899 Other long term (current) drug therapy: Secondary | ICD-10-CM | POA: Insufficient documentation

## 2017-12-07 DIAGNOSIS — Z7982 Long term (current) use of aspirin: Secondary | ICD-10-CM | POA: Insufficient documentation

## 2017-12-07 DIAGNOSIS — N183 Chronic kidney disease, stage 3 (moderate): Secondary | ICD-10-CM | POA: Diagnosis not present

## 2017-12-07 DIAGNOSIS — Z8521 Personal history of malignant neoplasm of larynx: Secondary | ICD-10-CM | POA: Diagnosis not present

## 2017-12-07 DIAGNOSIS — K219 Gastro-esophageal reflux disease without esophagitis: Secondary | ICD-10-CM | POA: Insufficient documentation

## 2017-12-07 DIAGNOSIS — Z8719 Personal history of other diseases of the digestive system: Secondary | ICD-10-CM | POA: Diagnosis not present

## 2017-12-07 DIAGNOSIS — I130 Hypertensive heart and chronic kidney disease with heart failure and stage 1 through stage 4 chronic kidney disease, or unspecified chronic kidney disease: Secondary | ICD-10-CM | POA: Insufficient documentation

## 2017-12-07 DIAGNOSIS — Z800 Family history of malignant neoplasm of digestive organs: Secondary | ICD-10-CM | POA: Insufficient documentation

## 2017-12-07 DIAGNOSIS — I493 Ventricular premature depolarization: Secondary | ICD-10-CM

## 2017-12-07 DIAGNOSIS — I252 Old myocardial infarction: Secondary | ICD-10-CM | POA: Insufficient documentation

## 2017-12-07 DIAGNOSIS — I429 Cardiomyopathy, unspecified: Secondary | ICD-10-CM | POA: Insufficient documentation

## 2017-12-07 DIAGNOSIS — E785 Hyperlipidemia, unspecified: Secondary | ICD-10-CM | POA: Diagnosis not present

## 2017-12-07 DIAGNOSIS — I4510 Unspecified right bundle-branch block: Secondary | ICD-10-CM | POA: Insufficient documentation

## 2017-12-07 DIAGNOSIS — Z87891 Personal history of nicotine dependence: Secondary | ICD-10-CM | POA: Insufficient documentation

## 2017-12-07 DIAGNOSIS — K900 Celiac disease: Secondary | ICD-10-CM | POA: Insufficient documentation

## 2017-12-07 DIAGNOSIS — I5022 Chronic systolic (congestive) heart failure: Secondary | ICD-10-CM | POA: Diagnosis not present

## 2017-12-07 DIAGNOSIS — Z9981 Dependence on supplemental oxygen: Secondary | ICD-10-CM | POA: Diagnosis not present

## 2017-12-07 DIAGNOSIS — I2510 Atherosclerotic heart disease of native coronary artery without angina pectoris: Secondary | ICD-10-CM | POA: Diagnosis not present

## 2017-12-07 LAB — COMPREHENSIVE METABOLIC PANEL
ALK PHOS: 55 U/L (ref 38–126)
ALT: 24 U/L (ref 17–63)
ANION GAP: 7 (ref 5–15)
AST: 32 U/L (ref 15–41)
Albumin: 3.4 g/dL — ABNORMAL LOW (ref 3.5–5.0)
BUN: 28 mg/dL — ABNORMAL HIGH (ref 6–20)
CALCIUM: 8.4 mg/dL — AB (ref 8.9–10.3)
CO2: 22 mmol/L (ref 22–32)
CREATININE: 1.44 mg/dL — AB (ref 0.61–1.24)
Chloride: 103 mmol/L (ref 101–111)
GFR calc Af Amer: 51 mL/min — ABNORMAL LOW (ref >60)
GFR calc non Af Amer: 44 mL/min — ABNORMAL LOW (ref >60)
Glucose, Bld: 108 mg/dL — ABNORMAL HIGH (ref 65–99)
Potassium: 5.2 mmol/L — ABNORMAL HIGH (ref 3.5–5.1)
SODIUM: 132 mmol/L — AB (ref 135–145)
Total Bilirubin: 1.1 mg/dL (ref 0.3–1.2)
Total Protein: 5.1 g/dL — ABNORMAL LOW (ref 6.5–8.1)

## 2017-12-07 LAB — T4, FREE: FREE T4: 0.92 ng/dL (ref 0.61–1.12)

## 2017-12-07 LAB — TSH: TSH: 4.323 u[IU]/mL (ref 0.350–4.500)

## 2017-12-07 NOTE — Patient Instructions (Signed)
Labs today  We will contact you in 9 months to schedule your next appointment.

## 2017-12-07 NOTE — Addendum Note (Signed)
Encounter addended by: Scarlette Calico, RN on: 12/07/2017 2:17 PM  Actions taken: Order list changed, Diagnosis association updated, Sign clinical note

## 2017-12-07 NOTE — Progress Notes (Signed)
Patient ID: [name], male   DOB: 12-07-1934, 83 y.o.   MRN: [id number]   ADVANCED HF CLINIC NOTE  Chief Complaint:  PAC's and PVC's, abnormal ekg, DOE  Primary Care Physician: Susy Frizzle, MD  HPI:  Bruce Travis is a 83 y.o. male with COPD (O2 dependent). HTN, HL and systolic HF fleto to be related to frequent PVCs.   Admitted in June 2017 with acute HF. Echo with EF 25%. Cath twith essentially normal coronaries. EF 20-25%. Low filling pressures but CO 2.5/1.5. Started on milrinone. 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 Started on amiodarone. PVCs went from ~20/minute to 1-2/minute by time of discharge. Milrinone weaned off.   Echo 04/12/16 LVEF 50-55%, Trivial AI, Mild Bruce, Mild LAE, PA peak pressure 46 mm Hg - much improved from previous.   Admitted 12/18 with PNA.   Bruce Travis presents today for regular follow up. Remains on amio 100 daily. He had a tough winter with recurrent respiratory infections. Denies CP. Chronic dyspnea which he feels is somewhat worse. No palpitations, edema or PND.    Echo 10/18 EF 50-55% Personally reviewed   ECG: NSR 72 with RBBB and PACs. No PVCs  PMHx:  Past Medical History:  Diagnosis Date  . Allergic rhinitis   . Bronchiectasis   . CAD (coronary artery disease)   . Celiac disease   . Chronic heart failure ([place])   . Chronic kidney disease (CKD), stage III (moderate) (HCC)   . COPD (chronic obstructive pulmonary disease) ([place])   . Diverticulosis of colon (without mention of hemorrhage)   . Esophageal reflux   . Family history of adverse reaction to anesthesia    daughter gets PONV  . Family hx of colon cancer   . History of stomach ulcers 1980s  . Hyperlipemia   . Hypertension   . 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 Myocardial infarction [facility])    "previous MI/echo in 12/2015"  . Nephrolithiasis    "got them now; never had OR/scopes" (02/03/2016)  . On home oxygen therapy    "3L; 24/7" (02/21/2017)  . Other  diseases of lung, not elsewhere classified   . Personal history of colonic polyps 04/26/2007   hyperplastic   . Pneumonia "several times"    Past Surgical History:  Procedure Laterality Date  . CARDIAC CATHETERIZATION  02/03/2016  . CARDIAC CATHETERIZATION  09/30/2009   non-obstructive CAD w/30% narrowing in prox LAD (Dr. Waunita Schooner)  . CARDIAC CATHETERIZATION  05/04/2001   same at 2011 cath (Dr. Waunita Schooner)  . CARDIAC CATHETERIZATION N/A 02/03/2016   Procedure: Right/Left Heart Cath and Coronary Angiography;  Surgeon: Pixie Casino, MD;  Location: [facility];  Service: Cardiovascular;  Laterality: N/A;  . EPIGLOTOPLASTY W/ MLB     removal due to carcinoma  . EXCISIONAL HEMORRHOIDECTOMY  2000s  . HAND SURGERY Right 1958   d/t crush injury  . TONSILLECTOMY AND ADENOIDECTOMY    . ULTRASOUND GUIDANCE FOR VASCULAR ACCESS  02/03/2016   Procedure: Ultrasound Guidance For Vascular Access;  Surgeon: Pixie Casino, MD;  Location: [facility];  Service: Cardiovascular;;  . VASECTOMY      FAMHx:  Family History  Problem Relation Age of Onset  . Heart disease Father   . Colon cancer Father   . Prostate cancer Father   . Stroke Mother   . Endometrial cancer Sister   . Stroke Maternal Grandmother   . Cancer  Paternal Grandmother     SOCHx:   reports that he quit smoking about 28 years ago. His smoking use included cigarettes. He has a 70.00 pack-year smoking history. He quit smokeless tobacco use about 16 years ago. His smokeless tobacco use included chew. He reports that he does not drink alcohol or use drugs.  ALLERGIES:  Allergies  Allergen Reactions  . Codeine Other (See Comments)    Thought head was going to blow off/ severe headache  . Adhesive [Tape] Other (See Comments)    Band-aids - tears skin off  . Gluten Meal Other (See Comments)    Celiac disease    ROS: Pertinent items noted in HPI and remainder of comprehensive ROS otherwise negative.  HOME  MEDS: Current Outpatient Medications  Medication Sig Dispense Refill  . albuterol (PROAIR HFA) 108 (90 Base) MCG/ACT inhaler INHALE 2 PUFFS EVERY 4 HOURS AS NEEDED FOR SHORTNESS OF BREATH. 2 Inhaler 5  . albuterol (PROVENTIL) (2.5 MG/3ML) 0.083% nebulizer solution Take 3 mLs (2.5 mg total) by nebulization every 4 (four) hours as needed for wheezing or shortness of breath. 75 mL 0  . ALPRAZolam (XANAX) 0.25 MG tablet Take 1 tablet (0.25 mg total) by mouth 2 (two) times daily as needed for anxiety. 20 tablet 0  . amiodarone (PACERONE) 200 MG tablet Take 0.5 tablets (100 mg total) by mouth daily. 15 tablet 11  . aspirin EC 81 MG tablet Take 81 mg by mouth daily.    Marland Kitchen azelastine (ASTELIN) 0.1 % nasal spray PLACE 2 SPRAYS INTO EACH NOSTRILS TWICE DAILY AS DIRECTED. 30 mL 0  . Calcium Carbonate-Vitamin D (CALTRATE 600+D) 600-400 MG-UNIT tablet Take 1 tablet by mouth daily.    Marland Kitchen dextromethorphan-guaiFENesin (MUCINEX DM) 30-600 MG 12hr tablet Take 1 tablet by mouth 2 (two) times daily.    Marland Kitchen ENTRESTO 49-51 MG TAKE (1) TABLET BY MOUTH TWICE DAILY. 60 tablet 11  . HYDROcodone-homatropine (HYCODAN) 5-1.5 MG/5ML syrup Take 2.5 - 5 mLs PO Q 8 hours PRN for severe coughing. DO NOT TAKE at the same time as XANAX. 120 mL 0  . LUTEIN PO Take 1 capsule by mouth daily.    . montelukast (SINGULAIR) 10 MG tablet TAKE 1 TABLET BY MOUTH DAILY. 90 tablet 0  . Multiple Vitamins-Minerals (CENTRUM SILVER 50+MEN) TABS Take 1 tablet by mouth daily.    . OXYGEN Inhale 3 L into the lungs continuous. 3lpm with rest and 6 lpm with exertion    . pantoprazole (PROTONIX) 40 MG tablet TAKE ONE TABLET BY MOUTH DAILY. 90 tablet 3  . polyethylene glycol (MIRALAX / GLYCOLAX) packet Take 17 g by mouth daily as needed for mild constipation. Mix in 8 oz liquid and drink    . spironolactone (ALDACTONE) 25 MG tablet Take 0.5 tablets (12.5 mg total) by mouth daily. 16 tablet 6  . SYMBICORT 160-4.5 MCG/ACT inhaler INHALE 2 PUFFS FIRST THING IN  MORNING AND THEN ANOTHER 2 PUFFS ABOUT 12 HOURS LATER. 10.2 g 11  . tamsulosin (FLOMAX) 0.4 MG CAPS capsule Take 1 capsule (0.4 mg total) by mouth daily. 30 capsule 3  . Tiotropium Bromide Monohydrate (SPIRIVA RESPIMAT) 2.5 MCG/ACT AERS Inhale 2 puffs into the lungs daily. 1 Inhaler 11   No current facility-administered medications for this encounter.     LABS/IMAGING: No results found for this or any previous visit (from the past 48 hour(s)). No results found.  WEIGHTS: Wt Readings from Last 3 Encounters:  12/07/17 127 lb 8 oz (57.8 kg)  11/29/17 128 lb (58.1 kg)  11/22/17 130 lb (59 kg)    VITALS: BP (!) 118/54   Pulse 82   Wt 127 lb 8 oz (57.8 kg)   SpO2 95% Comment: on 3L of O2  BMI 20.27 kg/m   Wt Readings from Last 3 Encounters:  12/07/17 127 lb 8 oz (57.8 kg)  11/29/17 128 lb (58.1 kg)  11/22/17 130 lb (59 kg)     EXAM: General:  Elderly thin on O2 No resp difficulty HEENT: normal Neck: supple. no JVD. Carotids 2+ bilat; no bruits. No lymphadenopathy or thryomegaly appreciated. Cor: PMI nondisplaced. Regular rate & rhythm. 2/6 TR Lungs: decreased throughout no wheeze  Abdomen: soft, nontender, nondistended. No hepatosplenomegaly. No bruits or masses. Good bowel sounds. Extremities: no cyanosis, clubbing, rash, edema Neuro: alert & orientedx3, cranial nerves grossly intact. moves all 4 extremities w/o difficulty. Affect pleasant   ECG NSR with RBBB 1 PVC Personally reviewed  ASSESSMENT: 1. Chronic systolic HF EF 64%. EF now normal by Echo 04/12/16 LVEF 50-55%, Trivial AI, Mild Bruce, Mild LAE, PA peak pressure 46 mm Hg. Echo 10/18 with EF 50-55%.    - likely due to PVC cardiomyopathy     - EF improved with PVC suppression on amio    - NYHA II-III stable    - Continue Entresto 49/51 and spiro 12.5 mg daily.  Will not uptitrate with age and borderline hyperkalemia.     - No b-blocker with severe COPD  2. PVC related cardiomyopathy   -PVCs suppressed with amio  with normalization of LV function. amio now 145m daily Will continue   - Agree with concern risk of amio toxicity given the severity of his COPD. That said risk of lung toxicity on amio 100 daily is very low and he was very ill with severe PVC cardiomyopathy. Not good candidate for PVC ablation. I previously discussed with Dr. ARayann Hemanin EP and we both agree that risk of stopping amio outweighs risk of continuing low-dose amio.  -check TSH, LFTs. Eye exam 1/19 was ok.  3. Chronic respiratory failure due to COPD on home O2 4. HTN    -Blood pressure well controlled. Continue current regimen.    DGlori BickersMD 12/07/2017, 2:03 PM

## 2017-12-07 NOTE — Addendum Note (Signed)
Encounter addended by: Harvie Junior, CMA on: 12/07/2017 2:12 PM  Actions taken: Order list changed

## 2017-12-08 ENCOUNTER — Encounter (HOSPITAL_COMMUNITY): Payer: Self-pay

## 2017-12-08 ENCOUNTER — Encounter: Payer: Self-pay | Admitting: Family Medicine

## 2017-12-08 ENCOUNTER — Ambulatory Visit: Payer: Medicare Other | Admitting: Family Medicine

## 2017-12-08 VITALS — BP 128/60 | HR 68 | Temp 97.6°F | Resp 16 | Ht 66.5 in | Wt 127.0 lb

## 2017-12-08 DIAGNOSIS — R059 Cough, unspecified: Secondary | ICD-10-CM

## 2017-12-08 DIAGNOSIS — R05 Cough: Secondary | ICD-10-CM | POA: Diagnosis not present

## 2017-12-08 MED ORDER — HYDROCODONE-HOMATROPINE 5-1.5 MG/5ML PO SYRP: 5.0000 mL | ORAL_SOLUTION | Freq: Three times a day (TID) | ORAL | 0 refills | Status: DC | PRN

## 2017-12-08 NOTE — Progress Notes (Signed)
Subjective:    Patient ID: KEYVIN RISON, male    DOB: 03-Apr-1935, 83 y.o.   MRN: [id number]  HPI  11/22/17 Today as a follow-up from last week.  His breathing is definitely better.  He is on his last dose of prednisone 60 mg today.  He continues to have a cough productive of thick gray sputum which is different in characteristic from his baseline.  He also continues to endorse increased sputum production however his shortness of breath has improved dramatically.  He denies any fevers or hemoptysis.  There is no tachypnea.  He is breathing relaxed this morning.  His breath sounds have improved and he has increased air movement.  At that time, my plan was:  Patient is clinically improving.  Wean the patient gradually off prednisone.  I have attempted abrupt discontinuation of prednisone in the past and the patient has experienced rebound.  Therefore I would decrease him to 40 mg of prednisone a day for 4-5 days and then decrease to 20 mg of prednisone for 4-5 days then discontinue the medication altogether.  Decrease the frequency of albuterol nebulizer treatments to as needed.  He has been doing them every 4-6 hours.  Complete the antibiotics as prescribed.  I also encouraged the patient to use Xanax when he feels short of breath as I do believe there is a component of air hunger and anxiety contributing to his perceived dyspnea  12/08/17 Patient is essentially back to his baseline regarding his breathing.  However he continues to experience a cough.  The cough is primarily at night when he lays down.  During the daytime he is fine.  However when he lays down at night, he develops a postnasal drip/tickle in his throat that causes him to cough frequently.  Cough is productive of white sputum.  Cough keeps him awake at night.  He denies any shortness of breath beyond his baseline.  He denies any wheezing.  He denies any fevers or chills.  He denies any chest pain.  He denies any hemoptysis.  He is already on a  nasal antihistamine along with Singulair for his allergies. Past Medical History:  Diagnosis Date  . Allergic rhinitis   . Bronchiectasis   . CAD (coronary artery disease)   . Celiac disease   . Chronic heart failure ([place])   . Chronic kidney disease (CKD), stage III (moderate) (HCC)   . COPD (chronic obstructive pulmonary disease) ([place])   . Diverticulosis of colon (without mention of hemorrhage)   . Esophageal reflux   . Family history of adverse reaction to anesthesia    daughter gets PONV  . Family hx of colon cancer   . History of stomach ulcers 1980s  . Hyperlipemia   . Hypertension   . 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 Myocardial infarction [facility])    "previous MI/echo in 12/2015"  . Nephrolithiasis    "got them now; never had OR/scopes" (02/03/2016)  . On home oxygen therapy    "3L; 24/7" (02/21/2017)  . Other diseases of lung, not elsewhere classified   . Personal history of colonic polyps 04/26/2007   hyperplastic   . Pneumonia "several times"   Past Surgical History:  Procedure Laterality Date  . CARDIAC CATHETERIZATION  02/03/2016  . CARDIAC CATHETERIZATION  09/30/2009   non-obstructive CAD w/30% narrowing in prox LAD (Dr. Waunita Schooner)  . CARDIAC CATHETERIZATION  05/04/2001   same at 2011 cath (Dr. Waunita Schooner)  .  CARDIAC CATHETERIZATION N/A 02/03/2016   Procedure: Right/Left Heart Cath and Coronary Angiography;  Surgeon: Pixie Casino, MD;  Location: [facility];  Service: Cardiovascular;  Laterality: N/A;  . EPIGLOTOPLASTY W/ MLB     removal due to carcinoma  . EXCISIONAL HEMORRHOIDECTOMY  2000s  . HAND SURGERY Right 1958   d/t crush injury  . TONSILLECTOMY AND ADENOIDECTOMY    . ULTRASOUND GUIDANCE FOR VASCULAR ACCESS  02/03/2016   Procedure: Ultrasound Guidance For Vascular Access;  Surgeon: Pixie Casino, MD;  Location: [facility];  Service: Cardiovascular;;  . VASECTOMY     Current Outpatient Medications on File  Prior to Visit  Medication Sig Dispense Refill  . albuterol (PROAIR HFA) 108 (90 Base) MCG/ACT inhaler INHALE 2 PUFFS EVERY 4 HOURS AS NEEDED FOR SHORTNESS OF BREATH. 2 Inhaler 5  . albuterol (PROVENTIL) (2.5 MG/3ML) 0.083% nebulizer solution Take 3 mLs (2.5 mg total) by nebulization every 4 (four) hours as needed for wheezing or shortness of breath. 75 mL 0  . ALPRAZolam (XANAX) 0.25 MG tablet Take 1 tablet (0.25 mg total) by mouth 2 (two) times daily as needed for anxiety. 20 tablet 0  . amiodarone (PACERONE) 200 MG tablet Take 0.5 tablets (100 mg total) by mouth daily. 15 tablet 11  . aspirin EC 81 MG tablet Take 81 mg by mouth daily.    Marland Kitchen azelastine (ASTELIN) 0.1 % nasal spray PLACE 2 SPRAYS INTO EACH NOSTRILS TWICE DAILY AS DIRECTED. 30 mL 0  . Calcium Carbonate-Vitamin D (CALTRATE 600+D) 600-400 MG-UNIT tablet Take 1 tablet by mouth daily.    Marland Kitchen ENTRESTO 49-51 MG TAKE (1) TABLET BY MOUTH TWICE DAILY. 60 tablet 11  . HYDROcodone-homatropine (HYCODAN) 5-1.5 MG/5ML syrup Take 2.5 - 5 mLs PO Q 8 hours PRN for severe coughing. DO NOT TAKE at the same time as XANAX. 120 mL 0  . LUTEIN PO Take 1 capsule by mouth daily.    . montelukast (SINGULAIR) 10 MG tablet TAKE 1 TABLET BY MOUTH DAILY. 90 tablet 0  . Multiple Vitamins-Minerals (CENTRUM SILVER 50+MEN) TABS Take 1 tablet by mouth daily.    . OXYGEN Inhale 3 L into the lungs continuous. 3lpm with rest and 6 lpm with exertion    . pantoprazole (PROTONIX) 40 MG tablet TAKE ONE TABLET BY MOUTH DAILY. 90 tablet 3  . polyethylene glycol (MIRALAX / GLYCOLAX) packet Take 17 g by mouth daily as needed for mild constipation. Mix in 8 oz liquid and drink    . spironolactone (ALDACTONE) 25 MG tablet Take 0.5 tablets (12.5 mg total) by mouth daily. 16 tablet 6  . SYMBICORT 160-4.5 MCG/ACT inhaler INHALE 2 PUFFS FIRST THING IN MORNING AND THEN ANOTHER 2 PUFFS ABOUT 12 HOURS LATER. 10.2 g 11  . tamsulosin (FLOMAX) 0.4 MG CAPS capsule Take 1 capsule (0.4 mg  total) by mouth daily. 30 capsule 3  . Tiotropium Bromide Monohydrate (SPIRIVA RESPIMAT) 2.5 MCG/ACT AERS Inhale 2 puffs into the lungs daily. 1 Inhaler 11  . dextromethorphan-guaiFENesin (MUCINEX DM) 30-600 MG 12hr tablet Take 1 tablet by mouth 2 (two) times daily.     No current facility-administered medications on file prior to visit.    Allergies  Allergen Reactions  . Codeine Other (See Comments)    Thought head was going to blow off/ severe headache  . Adhesive [Tape] Other (See Comments)    Band-aids - tears skin off  . Gluten Meal Other (See Comments)    Celiac disease  Social History   Socioeconomic History  . Marital status: Married    Spouse name: Not on file  . Number of children: 3  . Years of education: Not on file  . Highest education level: Not on file  Occupational History  . 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 Financial resource strain: Not on file  . Food insecurity:    Worry: Not on file    Inability: Not on file  . Transportation needs:    Medical: Not on file    Non-medical: Not on file  Tobacco Use  . Smoking status: Former Smoker    Packs/day: 2.00    Years: 35.00    Pack years: 70.00    Types: Cigarettes    Last attempt to quit: 01/21/1989    Years since quitting: 28.8  . Smokeless tobacco: Former Systems developer    Types: [place] date: 08/23/2001  Substance and Sexual Activity  . Alcohol use: No  . Drug use: No  . Sexual activity: Not on file  Lifestyle  . Physical activity:    Days per week: Not on file    Minutes per session: Not on file  . Stress: Not on file  Relationships  . Social connections:    Talks on phone: Not on file    Gets together: Not on file    Attends religious service: Not on file    Active member of club or organization: Not on file    Attends meetings of clubs or organizations: Not on file    Relationship status: Not on file  . Intimate partner violence:     Fear of current or ex partner: Not on file    Emotionally abused: Not on file    Physically abused: Not on file    Forced sexual activity: Not on file  Other Topics Concern  . Not on file  Social History Narrative  . Not on file      Review of Systems  Respiratory: Positive for cough.   All other systems reviewed and are negative.      Objective:   Physical Exam  HENT:  Right Ear: Tympanic membrane and ear canal normal.  Left Ear: Tympanic membrane and ear canal normal.  Nose: No mucosal edema or rhinorrhea.  Cardiovascular: Normal rate, regular rhythm and normal heart sounds.  No murmur heard. Pulmonary/Chest: He has decreased breath sounds. He has wheezes. He has no rhonchi. He has no rales. He exhibits no tenderness.  Vitals reviewed.        Assessment & Plan:   Cough  Patient is at his baseline based on his exam.  I do not believe the cough is a side of a serious bacterial illness or COPD exacerbation.  I do believe is likely due to allergies or postnasal drip given its diurnal variation and tendency to occur only when the patient is supine.  His heart sounds are normal today.  There is no evidence of fluid overload or pulmonary edema.  Therefore I will treat the cough symptomatically with Hycodan 1 teaspoon every 6 hours as needed for cough as well as Mucinex.  Continue Singulair and Astelin.  Consider adding an antihistamine if no better next week.  Recheck if symptoms worsen

## 2017-12-13 ENCOUNTER — Telehealth: Payer: Self-pay | Admitting: Family Medicine

## 2017-12-13 ENCOUNTER — Encounter (HOSPITAL_COMMUNITY)
Admission: RE | Admit: 2017-12-13 | Discharge: 2017-12-13 | Disposition: A | Discharge status: Home / Self Care | Payer: Medicare Other | Source: Ambulatory Visit | Attending: Internal Medicine | Admitting: Internal Medicine

## 2017-12-13 MED ORDER — AZELASTINE HCL 0.1 % NA SOLN: NASAL | 5 refills | Status: DC

## 2017-12-13 NOTE — Telephone Encounter (Signed)
Medication called/sent to requested pharmacy  

## 2017-12-13 NOTE — Telephone Encounter (Signed)
Pt needs refill on astelin nasal spray to [facility]

## 2017-12-15 ENCOUNTER — Encounter (HOSPITAL_COMMUNITY)
Admission: RE | Admit: 2017-12-15 | Discharge: 2017-12-15 | Disposition: A | Discharge status: Home / Self Care | Payer: Medicare Other | Source: Ambulatory Visit | Attending: Internal Medicine | Admitting: Internal Medicine

## 2017-12-20 ENCOUNTER — Encounter (HOSPITAL_COMMUNITY)
Admission: RE | Admit: 2017-12-20 | Discharge: 2017-12-20 | Disposition: A | Discharge status: Home / Self Care | Payer: Self-pay | Source: Ambulatory Visit | Attending: Internal Medicine | Admitting: Internal Medicine

## 2017-12-22 ENCOUNTER — Encounter (HOSPITAL_COMMUNITY)
Admission: RE | Admit: 2017-12-22 | Discharge: 2017-12-22 | Disposition: A | Discharge status: Home / Self Care | Payer: Self-pay | Source: Ambulatory Visit | Attending: Internal Medicine | Admitting: Internal Medicine

## 2017-12-22 DIAGNOSIS — I5021 Acute systolic (congestive) heart failure: Secondary | ICD-10-CM | POA: Insufficient documentation

## 2017-12-26 ENCOUNTER — Ambulatory Visit
Admission: RE | Admit: 2017-12-26 | Discharge: 2017-12-26 | Disposition: A | Discharge status: Home / Self Care | Payer: Medicare Other | Source: Ambulatory Visit | Attending: Family Medicine | Admitting: Family Medicine

## 2017-12-26 ENCOUNTER — Ambulatory Visit: Payer: Medicare Other | Admitting: Family Medicine

## 2017-12-26 ENCOUNTER — Other Ambulatory Visit (HOSPITAL_COMMUNITY): Payer: Self-pay | Admitting: Internal Medicine

## 2017-12-26 ENCOUNTER — Encounter: Payer: Self-pay | Admitting: Family Medicine

## 2017-12-26 VITALS — BP 130/82 | HR 88 | Temp 97.9°F | Resp 22 | Ht 66.5 in | Wt 127.0 lb

## 2017-12-26 DIAGNOSIS — J441 Chronic obstructive pulmonary disease with (acute) exacerbation: Secondary | ICD-10-CM | POA: Diagnosis not present

## 2017-12-26 MED ORDER — PREDNISONE 20 MG PO TABS: 60.0000 mg | ORAL_TABLET | Freq: Every day | ORAL | 0 refills | Status: DC

## 2017-12-26 MED ORDER — METHYLPREDNISOLONE ACETATE 80 MG/ML IJ SUSP
80.0000 mg | Freq: Once | INTRAMUSCULAR | Status: AC
  Administered 2017-12-26: 80 mg via INTRAMUSCULAR

## 2017-12-26 NOTE — Addendum Note (Signed)
Addended by: Shary Decamp B on: 12/26/2017 11:23 AM   Modules accepted: Orders

## 2017-12-26 NOTE — Progress Notes (Signed)
Subjective:    Patient ID: Bruce Travis, male    DOB: 03-06-1935, 83 y.o.   MRN: [id number]  HPI  Patient reports progressively worsening shortness of breath.  He states that while he sitting still he is completely fine however soon as he stands up, he quickly becomes winded and is very short of breath.  He denies any chest pain.  He denies any orthopnea.  His weight is stable.  There is no pitting edema there is no evidence of fluid overload on exam.  His cough is productive of gray sputum however this is his baseline.  He states that his cough is almost constant.  He believes allergies and pollen are exacerbating his emphysema.  He has been coughing now for more than a month however over the last 10 days it is gotten to the point where he is having significant dyspnea on exertion.  He is 89% on 3 L with ambulation.  He is 90% on 3 L at rest.  He denies any hemoptysis or pleurisy.  There is no pitting edema in his extremities.  There is no evidence of a DVT or PE Past Medical History:  Diagnosis Date  . Allergic rhinitis   . Bronchiectasis   . CAD (coronary artery disease)   . Celiac disease   . Chronic heart failure ([place])   . Chronic kidney disease (CKD), stage III (moderate) (HCC)   . COPD (chronic obstructive pulmonary disease) ([place])   . Diverticulosis of colon (without mention of hemorrhage)   . Esophageal reflux   . Family history of adverse reaction to anesthesia    daughter gets PONV  . Family hx of colon cancer   . History of stomach ulcers 1980s  . Hyperlipemia   . Hypertension   . 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 Myocardial infarction [facility])    "previous MI/echo in 12/2015"  . Nephrolithiasis    "got them now; never had OR/scopes" (02/03/2016)  . On home oxygen therapy    "3L; 24/7" (02/21/2017)  . Other diseases of lung, not elsewhere classified   . Personal history of colonic polyps 04/26/2007   hyperplastic   . Pneumonia "several times"    Past Surgical History:  Procedure Laterality Date  . CARDIAC CATHETERIZATION  02/03/2016  . CARDIAC CATHETERIZATION  09/30/2009   non-obstructive CAD w/30% narrowing in prox LAD (Dr. Waunita Schooner)  . CARDIAC CATHETERIZATION  05/04/2001   same at 2011 cath (Dr. Waunita Schooner)  . CARDIAC CATHETERIZATION N/A 02/03/2016   Procedure: Right/Left Heart Cath and Coronary Angiography;  Surgeon: Pixie Casino, MD;  Location: [place] [facility];  Service: Cardiovascular;  Laterality: N/A;  . EPIGLOTOPLASTY W/ MLB     removal due to carcinoma  . EXCISIONAL HEMORRHOIDECTOMY  2000s  . HAND SURGERY Right 1958   d/t crush injury  . TONSILLECTOMY AND ADENOIDECTOMY    . ULTRASOUND GUIDANCE FOR VASCULAR ACCESS  02/03/2016   Procedure: Ultrasound Guidance For Vascular Access;  Surgeon: Pixie Casino, MD;  Location: [place] [facility];  Service: Cardiovascular;;  . VASECTOMY     Current Outpatient Medications on File Prior to Visit  Medication Sig Dispense Refill  . albuterol (PROAIR HFA) 108 (90 Base) MCG/ACT inhaler INHALE 2 PUFFS EVERY 4 HOURS AS NEEDED FOR SHORTNESS OF BREATH. 2 Inhaler 5  . albuterol (PROVENTIL) (2.5 MG/3ML) 0.083% nebulizer solution Take 3 mLs (2.5 mg total) by nebulization every 4 (four) hours as  needed for wheezing or shortness of breath. 75 mL 0  . ALPRAZolam (XANAX) 0.25 MG tablet Take 1 tablet (0.25 mg total) by mouth 2 (two) times daily as needed for anxiety. 20 tablet 0  . amiodarone (PACERONE) 200 MG tablet Take 0.5 tablets (100 mg total) by mouth daily. 15 tablet 11  . aspirin EC 81 MG tablet Take 81 mg by mouth daily.    Marland Kitchen azelastine (ASTELIN) 0.1 % nasal spray PLACE 2 SPRAYS INTO EACH NOSTRILS TWICE DAILY AS DIRECTED. 30 mL 5  . Calcium Carbonate-Vitamin D (CALTRATE 600+D) 600-400 MG-UNIT tablet Take 1 tablet by mouth daily.    Marland Kitchen dextromethorphan-guaiFENesin (MUCINEX DM) 30-600 MG 12hr tablet Take 1 tablet by mouth 2 (two) times daily.    Marland Kitchen ENTRESTO 49-51 MG TAKE (1) TABLET BY  MOUTH TWICE DAILY. 60 tablet 11  . HYDROcodone-homatropine (HYCODAN) 5-1.5 MG/5ML syrup Take 2.5 - 5 mLs PO Q 8 hours PRN for severe coughing. DO NOT TAKE at the same time as XANAX. 120 mL 0  . LUTEIN PO Take 1 capsule by mouth daily.    . montelukast (SINGULAIR) 10 MG tablet TAKE 1 TABLET BY MOUTH DAILY. 90 tablet 0  . Multiple Vitamins-Minerals (CENTRUM SILVER 50+MEN) TABS Take 1 tablet by mouth daily.    . OXYGEN Inhale 3 L into the lungs continuous. 3lpm with rest and 6 lpm with exertion    . pantoprazole (PROTONIX) 40 MG tablet TAKE ONE TABLET BY MOUTH DAILY. 90 tablet 3  . polyethylene glycol (MIRALAX / GLYCOLAX) packet Take 17 g by mouth daily as needed for mild constipation. Mix in 8 oz liquid and drink    . spironolactone (ALDACTONE) 25 MG tablet Take 0.5 tablets (12.5 mg total) by mouth daily. 16 tablet 6  . SYMBICORT 160-4.5 MCG/ACT inhaler INHALE 2 PUFFS FIRST THING IN MORNING AND THEN ANOTHER 2 PUFFS ABOUT 12 HOURS LATER. 10.2 g 11  . tamsulosin (FLOMAX) 0.4 MG CAPS capsule Take 1 capsule (0.4 mg total) by mouth daily. 30 capsule 3  . Tiotropium Bromide Monohydrate (SPIRIVA RESPIMAT) 2.5 MCG/ACT AERS Inhale 2 puffs into the lungs daily. 1 Inhaler 11   No current facility-administered medications on file prior to visit.    Allergies  Allergen Reactions  . Codeine Other (See Comments)    Thought head was going to blow off/ severe headache  . Adhesive [Tape] Other (See Comments)    Band-aids - tears skin off  . Gluten Meal Other (See Comments)    Celiac disease   Social History   Socioeconomic History  . Marital status: Married    Spouse name: Not on file  . Number of children: 3  . Years of education: Not on file  . Highest education level: Not on file  Occupational History  . 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 Financial resource strain: Not on file  . Food insecurity:    Worry: Not on file    Inability: Not  on file  . Transportation needs:    Medical: Not on file    Non-medical: Not on file  Tobacco Use  . Smoking status: Former Smoker    Packs/day: 2.00    Years: 35.00    Pack years: 70.00    Types: Cigarettes    Last attempt to quit: 01/21/1989    Years since quitting: 28.9  . Smokeless tobacco: Former Systems developer    Types: Loss adjuster, chartered  Quit date: 08/23/2001  Substance and Sexual Activity  . Alcohol use: No  . Drug use: No  . Sexual activity: Not on file  Lifestyle  . Physical activity:    Days per week: Not on file    Minutes per session: Not on file  . Stress: Not on file  Relationships  . Social connections:    Talks on phone: Not on file    Gets together: Not on file    Attends religious service: Not on file    Active member of club or organization: Not on file    Attends meetings of clubs or organizations: Not on file    Relationship status: Not on file  . Intimate partner violence:    Fear of current or ex partner: Not on file    Emotionally abused: Not on file    Physically abused: Not on file    Forced sexual activity: Not on file  Other Topics Concern  . Not on file  Social History Narrative  . Not on file      Review of Systems  All other systems reviewed and are negative.      Objective:   Physical Exam  HENT:  Right Ear: Tympanic membrane and ear canal normal.  Left Ear: Tympanic membrane and ear canal normal.  Nose: No mucosal edema or rhinorrhea.  Cardiovascular: Normal rate, regular rhythm and normal heart sounds.  No murmur heard. Pulmonary/Chest: He has decreased breath sounds. He has wheezes. He has no rhonchi. He has no rales. He exhibits no tenderness.  Vitals reviewed.        Assessment & Plan:  COPD exacerbation ([place]) - Plan: DG Chest 2 View  I believe this is most likely emphysema with an underlying asthma component exacerbated by seasonal allergies.  Patient is having postnasal drip and constant cough and is now wheezing and short of breath.   I do not believe this is infectious and therefore I do not believe that antibiotics will be beneficial.  I will give the patient 80 mg of Depo-Medrol IM and then start him on 60 mg a day Prednisol beginning tomorrow and reassess the patient in 48 hours.  If his breathing worsens go straight to the hospital.  There is no evidence of CHF on exam.  I will send the patient for a chest x-ray and if there is any evidence of an infiltrate, I will start the patient on broad-spectrum antibiotics however at this point, I believe this is more likely bronchospasm due to seasonal allergies/asthma exacerbating his emphysema

## 2017-12-27 ENCOUNTER — Other Ambulatory Visit: Payer: Self-pay | Admitting: Family Medicine

## 2017-12-27 ENCOUNTER — Encounter (HOSPITAL_COMMUNITY): Payer: Self-pay

## 2017-12-27 MED ORDER — DOXYCYCLINE HYCLATE 100 MG PO TABS: 100.0000 mg | ORAL_TABLET | Freq: Two times a day (BID) | ORAL | 0 refills | Status: DC

## 2017-12-27 MED ORDER — AMOXICILLIN 500 MG PO CAPS: 1000.0000 mg | ORAL_CAPSULE | Freq: Three times a day (TID) | ORAL | 0 refills | Status: DC

## 2017-12-29 ENCOUNTER — Encounter (HOSPITAL_COMMUNITY): Payer: Self-pay

## 2017-12-29 ENCOUNTER — Encounter (HOSPITAL_COMMUNITY): Payer: Self-pay | Admitting: Emergency Medicine

## 2017-12-29 ENCOUNTER — Other Ambulatory Visit: Payer: Self-pay

## 2017-12-29 ENCOUNTER — Ambulatory Visit: Payer: Medicare Other | Admitting: Family Medicine

## 2017-12-29 ENCOUNTER — Inpatient Hospital Stay (HOSPITAL_COMMUNITY)
Admission: EM | Admit: 2017-12-29 | Discharge: 2017-12-31 | DRG: 193 | Disposition: A | Discharge status: Home / Self Care | Payer: Medicare Other | Attending: Internal Medicine | Admitting: Internal Medicine

## 2017-12-29 ENCOUNTER — Encounter: Payer: Self-pay | Admitting: Family Medicine

## 2017-12-29 ENCOUNTER — Emergency Department (HOSPITAL_COMMUNITY): Payer: Medicare Other

## 2017-12-29 VITALS — BP 140/80 | HR 64 | Temp 97.6°F | Resp 22 | Ht 66.5 in | Wt 127.0 lb

## 2017-12-29 DIAGNOSIS — N183 Chronic kidney disease, stage 3 unspecified: Secondary | ICD-10-CM | POA: Diagnosis present

## 2017-12-29 DIAGNOSIS — J189 Pneumonia, unspecified organism: Secondary | ICD-10-CM

## 2017-12-29 DIAGNOSIS — I130 Hypertensive heart and chronic kidney disease with heart failure and stage 1 through stage 4 chronic kidney disease, or unspecified chronic kidney disease: Secondary | ICD-10-CM | POA: Diagnosis present

## 2017-12-29 DIAGNOSIS — Z9981 Dependence on supplemental oxygen: Secondary | ICD-10-CM

## 2017-12-29 DIAGNOSIS — Z8711 Personal history of peptic ulcer disease: Secondary | ICD-10-CM | POA: Diagnosis not present

## 2017-12-29 DIAGNOSIS — J441 Chronic obstructive pulmonary disease with (acute) exacerbation: Secondary | ICD-10-CM

## 2017-12-29 DIAGNOSIS — J470 Bronchiectasis with acute lower respiratory infection: Secondary | ICD-10-CM | POA: Diagnosis present

## 2017-12-29 DIAGNOSIS — I5032 Chronic diastolic (congestive) heart failure: Secondary | ICD-10-CM | POA: Diagnosis present

## 2017-12-29 DIAGNOSIS — I493 Ventricular premature depolarization: Secondary | ICD-10-CM | POA: Diagnosis present

## 2017-12-29 DIAGNOSIS — Z885 Allergy status to narcotic agent status: Secondary | ICD-10-CM

## 2017-12-29 DIAGNOSIS — R05 Cough: Secondary | ICD-10-CM | POA: Diagnosis present

## 2017-12-29 DIAGNOSIS — E785 Hyperlipidemia, unspecified: Secondary | ICD-10-CM | POA: Diagnosis present

## 2017-12-29 DIAGNOSIS — K219 Gastro-esophageal reflux disease without esophagitis: Secondary | ICD-10-CM | POA: Diagnosis present

## 2017-12-29 DIAGNOSIS — N400 Enlarged prostate without lower urinary tract symptoms: Secondary | ICD-10-CM | POA: Diagnosis present

## 2017-12-29 DIAGNOSIS — Z91018 Allergy to other foods: Secondary | ICD-10-CM

## 2017-12-29 DIAGNOSIS — Z66 Do not resuscitate: Secondary | ICD-10-CM | POA: Diagnosis present

## 2017-12-29 DIAGNOSIS — I255 Ischemic cardiomyopathy: Secondary | ICD-10-CM | POA: Diagnosis present

## 2017-12-29 DIAGNOSIS — J471 Bronchiectasis with (acute) exacerbation: Secondary | ICD-10-CM | POA: Diagnosis present

## 2017-12-29 DIAGNOSIS — J9601 Acute respiratory failure with hypoxia: Secondary | ICD-10-CM | POA: Diagnosis present

## 2017-12-29 DIAGNOSIS — Z91048 Other nonmedicinal substance allergy status: Secondary | ICD-10-CM

## 2017-12-29 DIAGNOSIS — I252 Old myocardial infarction: Secondary | ICD-10-CM | POA: Diagnosis not present

## 2017-12-29 DIAGNOSIS — J188 Other pneumonia, unspecified organism: Principal | ICD-10-CM | POA: Diagnosis present

## 2017-12-29 DIAGNOSIS — Z8521 Personal history of malignant neoplasm of larynx: Secondary | ICD-10-CM

## 2017-12-29 DIAGNOSIS — I2510 Atherosclerotic heart disease of native coronary artery without angina pectoris: Secondary | ICD-10-CM | POA: Diagnosis present

## 2017-12-29 DIAGNOSIS — J9621 Acute and chronic respiratory failure with hypoxia: Secondary | ICD-10-CM | POA: Diagnosis present

## 2017-12-29 DIAGNOSIS — J181 Lobar pneumonia, unspecified organism: Secondary | ICD-10-CM | POA: Diagnosis not present

## 2017-12-29 DIAGNOSIS — Z87891 Personal history of nicotine dependence: Secondary | ICD-10-CM | POA: Diagnosis not present

## 2017-12-29 DIAGNOSIS — Z7982 Long term (current) use of aspirin: Secondary | ICD-10-CM | POA: Diagnosis not present

## 2017-12-29 DIAGNOSIS — E872 Acidosis: Secondary | ICD-10-CM | POA: Diagnosis present

## 2017-12-29 DIAGNOSIS — K900 Celiac disease: Secondary | ICD-10-CM | POA: Diagnosis present

## 2017-12-29 DIAGNOSIS — I10 Essential (primary) hypertension: Secondary | ICD-10-CM | POA: Diagnosis present

## 2017-12-29 DIAGNOSIS — Z79899 Other long term (current) drug therapy: Secondary | ICD-10-CM | POA: Diagnosis not present

## 2017-12-29 HISTORY — DX: Anxiety disorder, unspecified: F41.9

## 2017-12-29 LAB — CBC WITH DIFFERENTIAL/PLATELET
BASOS PCT: 0 %
Basophils Absolute: 0 10*3/uL (ref 0.0–0.1)
Eosinophils Absolute: 0 10*3/uL (ref 0.0–0.7)
Eosinophils Relative: 0 %
HEMATOCRIT: 44.5 % (ref 39.0–52.0)
Hemoglobin: 14.6 g/dL (ref 13.0–17.0)
Lymphocytes Relative: 6 %
Lymphs Abs: 0.5 10*3/uL — ABNORMAL LOW (ref 0.7–4.0)
MCH: 33.2 pg (ref 26.0–34.0)
MCHC: 32.8 g/dL (ref 30.0–36.0)
MCV: 101.1 fL — ABNORMAL HIGH (ref 78.0–100.0)
MONO ABS: 0.2 10*3/uL (ref 0.1–1.0)
MONOS PCT: 2 %
NEUTROS ABS: 8.1 10*3/uL — AB (ref 1.7–7.7)
NEUTROS PCT: 92 %
Platelets: 260 10*3/uL (ref 150–400)
RBC: 4.4 MIL/uL (ref 4.22–5.81)
RDW: 13.9 % (ref 11.5–15.5)
WBC: 8.8 10*3/uL (ref 4.0–10.5)

## 2017-12-29 LAB — URINALYSIS, ROUTINE W REFLEX MICROSCOPIC
BILIRUBIN URINE: NEGATIVE
Glucose, UA: NEGATIVE mg/dL
Hgb urine dipstick: NEGATIVE
KETONES UR: NEGATIVE mg/dL
Leukocytes, UA: NEGATIVE
NITRITE: NEGATIVE
PH: 5 (ref 5.0–8.0)
PROTEIN: NEGATIVE mg/dL
Specific Gravity, Urine: 1.018 (ref 1.005–1.030)

## 2017-12-29 LAB — COMPREHENSIVE METABOLIC PANEL
ALT: 21 U/L (ref 17–63)
ANION GAP: 10 (ref 5–15)
AST: 19 U/L (ref 15–41)
Albumin: 3.4 g/dL — ABNORMAL LOW (ref 3.5–5.0)
Alkaline Phosphatase: 59 U/L (ref 38–126)
BUN: 28 mg/dL — ABNORMAL HIGH (ref 6–20)
CALCIUM: 9.3 mg/dL (ref 8.9–10.3)
CO2: 21 mmol/L — ABNORMAL LOW (ref 22–32)
Chloride: 108 mmol/L (ref 101–111)
Creatinine, Ser: 1.29 mg/dL — ABNORMAL HIGH (ref 0.61–1.24)
GFR, EST AFRICAN AMERICAN: 58 mL/min — AB (ref >60)
GFR, EST NON AFRICAN AMERICAN: 50 mL/min — AB (ref >60)
GLUCOSE: 136 mg/dL — AB (ref 65–99)
Potassium: 4.3 mmol/L (ref 3.5–5.1)
Sodium: 139 mmol/L (ref 135–145)
TOTAL PROTEIN: 6 g/dL — AB (ref 6.5–8.1)
Total Bilirubin: 0.7 mg/dL (ref 0.3–1.2)

## 2017-12-29 LAB — I-STAT CG4 LACTIC ACID, ED
LACTIC ACID, VENOUS: 3.15 mmol/L — AB (ref 0.5–1.9)
LACTIC ACID, VENOUS: 1.91 mmol/L — AB (ref 0.5–1.9)

## 2017-12-29 MED ORDER — IPRATROPIUM-ALBUTEROL 0.5-2.5 (3) MG/3ML IN SOLN
3.0000 mL | Freq: Four times a day (QID) | RESPIRATORY_TRACT | Status: DC
  Administered 2017-12-29 (×2): 3 mL via RESPIRATORY_TRACT
  Filled 2017-12-29 (×2): qty 3

## 2017-12-29 MED ORDER — POLYETHYLENE GLYCOL 3350 17 G PO PACK: 17.0000 g | PACK | Freq: Every day | ORAL | Status: DC | PRN

## 2017-12-29 MED ORDER — CALCIUM CARBONATE-VITAMIN D 500-200 MG-UNIT PO TABS
1.0000 | ORAL_TABLET | Freq: Every day | ORAL | Status: DC
  Administered 2017-12-30 – 2017-12-31 (×2): 1 via ORAL
  Filled 2017-12-29 (×3): qty 1

## 2017-12-29 MED ORDER — TAMSULOSIN HCL 0.4 MG PO CAPS
0.4000 mg | ORAL_CAPSULE | Freq: Every day | ORAL | Status: DC
  Administered 2017-12-30 – 2017-12-31 (×2): 0.4 mg via ORAL
  Filled 2017-12-29 (×2): qty 1

## 2017-12-29 MED ORDER — AZITHROMYCIN 500 MG IV SOLR
500.0000 mg | INTRAVENOUS | Status: DC
  Filled 2017-12-29: qty 500

## 2017-12-29 MED ORDER — BUDESONIDE 0.25 MG/2ML IN SUSP
0.2500 mg | Freq: Two times a day (BID) | RESPIRATORY_TRACT | Status: DC
  Administered 2017-12-29 – 2017-12-31 (×4): 0.25 mg via RESPIRATORY_TRACT
  Filled 2017-12-29 (×4): qty 2

## 2017-12-29 MED ORDER — ADULT MULTIVITAMIN W/MINERALS CH
1.0000 | ORAL_TABLET | Freq: Every day | ORAL | Status: DC
  Administered 2017-12-30 – 2017-12-31 (×2): 1 via ORAL
  Filled 2017-12-29 (×3): qty 1

## 2017-12-29 MED ORDER — PANTOPRAZOLE SODIUM 40 MG PO TBEC
40.0000 mg | DELAYED_RELEASE_TABLET | Freq: Every day | ORAL | Status: DC
  Administered 2017-12-30 – 2017-12-31 (×2): 40 mg via ORAL
  Filled 2017-12-29 (×2): qty 1

## 2017-12-29 MED ORDER — ASPIRIN EC 81 MG PO TBEC
81.0000 mg | DELAYED_RELEASE_TABLET | Freq: Every day | ORAL | Status: DC
  Administered 2017-12-30 – 2017-12-31 (×2): 81 mg via ORAL
  Filled 2017-12-29 (×2): qty 1

## 2017-12-29 MED ORDER — GUAIFENESIN ER 600 MG PO TB12
1200.0000 mg | ORAL_TABLET | Freq: Two times a day (BID) | ORAL | Status: DC
  Administered 2017-12-29 – 2017-12-31 (×4): 1200 mg via ORAL
  Filled 2017-12-29 (×4): qty 2

## 2017-12-29 MED ORDER — ALBUTEROL SULFATE (2.5 MG/3ML) 0.083% IN NEBU: 2.5000 mg | INHALATION_SOLUTION | RESPIRATORY_TRACT | 0 refills | Status: DC | PRN

## 2017-12-29 MED ORDER — METHYLPREDNISOLONE SODIUM SUCC 125 MG IJ SOLR
60.0000 mg | Freq: Three times a day (TID) | INTRAMUSCULAR | Status: DC
  Administered 2017-12-30 (×2): 60 mg via INTRAVENOUS
  Filled 2017-12-29 (×2): qty 2

## 2017-12-29 MED ORDER — METHYLPREDNISOLONE SODIUM SUCC 125 MG IJ SOLR
125.0000 mg | Freq: Once | INTRAMUSCULAR | Status: AC
  Administered 2017-12-29: 125 mg via INTRAVENOUS
  Filled 2017-12-29: qty 2

## 2017-12-29 MED ORDER — ENOXAPARIN SODIUM 30 MG/0.3ML ~~LOC~~ SOLN
30.0000 mg | SUBCUTANEOUS | Status: DC
  Administered 2017-12-29 – 2017-12-30 (×2): 30 mg via SUBCUTANEOUS
  Filled 2017-12-29 (×2): qty 0.3

## 2017-12-29 MED ORDER — MONTELUKAST SODIUM 10 MG PO TABS
10.0000 mg | ORAL_TABLET | Freq: Every day | ORAL | Status: DC
  Administered 2017-12-30: 10 mg via ORAL
  Filled 2017-12-29: qty 1

## 2017-12-29 MED ORDER — IPRATROPIUM-ALBUTEROL 0.5-2.5 (3) MG/3ML IN SOLN: 3.0000 mL | Freq: Four times a day (QID) | RESPIRATORY_TRACT | Status: DC

## 2017-12-29 MED ORDER — ALPRAZOLAM 0.25 MG PO TABS: 0.2500 mg | ORAL_TABLET | Freq: Two times a day (BID) | ORAL | Status: DC | PRN

## 2017-12-29 MED ORDER — ALBUTEROL SULFATE (2.5 MG/3ML) 0.083% IN NEBU: 2.5000 mg | INHALATION_SOLUTION | RESPIRATORY_TRACT | Status: DC | PRN

## 2017-12-29 MED ORDER — ARFORMOTEROL TARTRATE 15 MCG/2ML IN NEBU
15.0000 ug | INHALATION_SOLUTION | Freq: Two times a day (BID) | RESPIRATORY_TRACT | Status: DC
  Administered 2017-12-29 – 2017-12-31 (×4): 15 ug via RESPIRATORY_TRACT
  Filled 2017-12-29 (×4): qty 2

## 2017-12-29 MED ORDER — SACUBITRIL-VALSARTAN 49-51 MG PO TABS
1.0000 | ORAL_TABLET | Freq: Two times a day (BID) | ORAL | Status: DC
  Administered 2017-12-29 – 2017-12-31 (×4): 1 via ORAL
  Filled 2017-12-29 (×4): qty 1

## 2017-12-29 MED ORDER — SODIUM CHLORIDE 0.9 % IV SOLN
1.0000 g | INTRAVENOUS | Status: DC
  Administered 2017-12-29 – 2017-12-30 (×2): 1 g via INTRAVENOUS
  Filled 2017-12-29 (×3): qty 10

## 2017-12-29 MED ORDER — BENZONATATE 100 MG PO CAPS
100.0000 mg | ORAL_CAPSULE | Freq: Three times a day (TID) | ORAL | Status: DC
  Administered 2017-12-29 – 2017-12-31 (×6): 100 mg via ORAL
  Filled 2017-12-29 (×6): qty 1

## 2017-12-29 MED ORDER — AMIODARONE HCL 200 MG PO TABS
100.0000 mg | ORAL_TABLET | Freq: Every day | ORAL | Status: DC
  Administered 2017-12-30 – 2017-12-31 (×2): 100 mg via ORAL
  Filled 2017-12-29 (×2): qty 1

## 2017-12-29 MED ORDER — AZELASTINE HCL 0.1 % NA SOLN
1.0000 | Freq: Two times a day (BID) | NASAL | Status: DC
  Administered 2017-12-29 – 2017-12-31 (×4): 1 via NASAL
  Filled 2017-12-29: qty 30

## 2017-12-29 MED ORDER — HYDROCODONE-HOMATROPINE 5-1.5 MG/5ML PO SYRP
5.0000 mL | ORAL_SOLUTION | Freq: Four times a day (QID) | ORAL | Status: DC | PRN
Start: 2017-12-29 — End: 2017-12-31

## 2017-12-29 NOTE — Progress Notes (Signed)
12/29/2017 Patient is alert, oriented and ambulatory. He came from emergency room to 2W at 1729. Patient sacrum pink, discoloration on harms. Patient  Was place on telemetry. [facility] RN.

## 2017-12-29 NOTE — ED Notes (Signed)
Attempted to call report

## 2017-12-29 NOTE — ED Notes (Signed)
First set of blood cultures collected by this RN with Kurin device at PIV start; sent to lab at this time. Second set to be drawn upstairs.

## 2017-12-29 NOTE — ED Notes (Signed)
Attempted to call report, RN to call back.

## 2017-12-29 NOTE — ED Notes (Signed)
Patient transported to X-ray 

## 2017-12-29 NOTE — Progress Notes (Signed)
Subjective:    Patient ID: Bruce Travis, male    DOB: 06/03/1935, 83 y.o.   MRN: [id number]  HPI  12/26/17 Patient reports progressively worsening shortness of breath.  He states that while he sitting still he is completely fine however soon as he stands up, he quickly becomes winded and is very short of breath.  He denies any chest pain.  He denies any orthopnea.  His weight is stable.  There is no pitting edema there is no evidence of fluid overload on exam.  His cough is productive of gray sputum however this is his baseline.  He states that his cough is almost constant.  He believes allergies and pollen are exacerbating his emphysema.  He has been coughing now for more than a month however over the last 10 days it is gotten to the point where he is having significant dyspnea on exertion.  He is 89% on 3 L with ambulation.  He is 90% on 3 L at rest.  He denies any hemoptysis or pleurisy.  There is no pitting edema in his extremities.  There is no evidence of a DVT or PE.  At that time, my plan was: I believe this is most likely emphysema with an underlying asthma component exacerbated by seasonal allergies.  Patient is having postnasal drip and constant cough and is now wheezing and short of breath.  I do not believe this is infectious and therefore I do not believe that antibiotics will be beneficial.  I will give the patient 80 mg of Depo-Medrol IM and then start him on 60 mg a day Prednisol beginning tomorrow and reassess the patient in 48 hours.  If his breathing worsens go straight to the hospital.  There is no evidence of CHF on exam.  I will send the patient for a chest x-ray and if there is any evidence of an infiltrate, I will start the patient on broad-spectrum antibiotics however at this point, I believe this is more likely bronchospasm due to seasonal allergies/asthma exacerbating his emphysema  12/29/17 IMPRESSION: 1. Prominent markings at the left lung base may represent focus of pneumonia.  Recommend continued follow-up. 2. Hyperaeration suggests emphysematous change  Patient is currently on prednisone 60 mg a day.  Due to his amiodarone and the risk of amiodarone being combined with a fluoroquinolone, the patient is on amoxicillin 1 g p.o. 3 times daily to cover Streptococcus pneumonia as well as doxycycline 100 mg p.o. twice daily to cover atypical infection such as mycoplasma or chlamydia.  He states that he is not doing much better.  He is 88% on 3 L walking in.  After sitting, he is at 90%.  When I asked how often he is doing his rescue medicine, he states that he does not have any nebulizers at home.  He has been doing [employer] once or twice a day if necessary for when he really feel short of breath.  This is much less than what I had expected the patient to be doing which is every 4-6 hours.  Patient is given a DuoNeb immediately today in clinic at 1130. Past Medical History:  Diagnosis Date  . Allergic rhinitis   . Bronchiectasis   . CAD (coronary artery disease)   . Celiac disease   . Chronic heart failure ([place])   . Chronic kidney disease (CKD), stage III (moderate) (HCC)   . COPD (chronic obstructive pulmonary disease) ([place])   . Diverticulosis of colon (without mention of  hemorrhage)   . Esophageal reflux   . Family history of adverse reaction to anesthesia    daughter gets PONV  . Family hx of colon cancer   . History of stomach ulcers 1980s  . Hyperlipemia   . Hypertension   . 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 Myocardial infarction [facility])    "previous MI/echo in 12/2015"  . Nephrolithiasis    "got them now; never had OR/scopes" (02/03/2016)  . On home oxygen therapy    "3L; 24/7" (02/21/2017)  . Other diseases of lung, not elsewhere classified   . Personal history of colonic polyps 04/26/2007   hyperplastic   . Pneumonia "several times"   Past Surgical History:  Procedure Laterality Date  . CARDIAC CATHETERIZATION  02/03/2016  .  CARDIAC CATHETERIZATION  09/30/2009   non-obstructive CAD w/30% narrowing in prox LAD (Dr. Waunita Schooner)  . CARDIAC CATHETERIZATION  05/04/2001   same at 2011 cath (Dr. Waunita Schooner)  . CARDIAC CATHETERIZATION N/A 02/03/2016   Procedure: Right/Left Heart Cath and Coronary Angiography;  Surgeon: Pixie Casino, MD;  Location: [place] [facility];  Service: Cardiovascular;  Laterality: N/A;  . EPIGLOTOPLASTY W/ MLB     removal due to carcinoma  . EXCISIONAL HEMORRHOIDECTOMY  2000s  . HAND SURGERY Right 1958   d/t crush injury  . TONSILLECTOMY AND ADENOIDECTOMY    . ULTRASOUND GUIDANCE FOR VASCULAR ACCESS  02/03/2016   Procedure: Ultrasound Guidance For Vascular Access;  Surgeon: Pixie Casino, MD;  Location: [place] [facility];  Service: Cardiovascular;;  . VASECTOMY     Current Outpatient Medications on File Prior to Visit  Medication Sig Dispense Refill  . albuterol (PROAIR HFA) 108 (90 Base) MCG/ACT inhaler INHALE 2 PUFFS EVERY 4 HOURS AS NEEDED FOR SHORTNESS OF BREATH. 2 Inhaler 5  . albuterol (PROVENTIL) (2.5 MG/3ML) 0.083% nebulizer solution Take 3 mLs (2.5 mg total) by nebulization every 4 (four) hours as needed for wheezing or shortness of breath. 75 mL 0  . ALPRAZolam (XANAX) 0.25 MG tablet Take 1 tablet (0.25 mg total) by mouth 2 (two) times daily as needed for anxiety. 20 tablet 0  . amiodarone (PACERONE) 200 MG tablet Take 0.5 tablets (100 mg total) by mouth daily. 15 tablet 11  . amoxicillin (AMOXIL) 500 MG capsule Take 2 capsules (1,000 mg total) by mouth 3 (three) times daily. 60 capsule 0  . aspirin EC 81 MG tablet Take 81 mg by mouth daily.    Marland Kitchen azelastine (ASTELIN) 0.1 % nasal spray PLACE 2 SPRAYS INTO EACH NOSTRILS TWICE DAILY AS DIRECTED. 30 mL 5  . Calcium Carbonate-Vitamin D (CALTRATE 600+D) 600-400 MG-UNIT tablet Take 1 tablet by mouth daily.    Marland Kitchen dextromethorphan-guaiFENesin (MUCINEX DM) 30-600 MG 12hr tablet Take 1 tablet by mouth 2 (two) times daily.    Marland Kitchen doxycycline  (VIBRA-TABS) 100 MG tablet Take 1 tablet (100 mg total) by mouth 2 (two) times daily. 20 tablet 0  . ENTRESTO 49-51 MG TAKE (1) TABLET BY MOUTH TWICE DAILY. 60 tablet 11  . HYDROcodone-homatropine (HYCODAN) 5-1.5 MG/5ML syrup Take 2.5 - 5 mLs PO Q 8 hours PRN for severe coughing. DO NOT TAKE at the same time as XANAX. 120 mL 0  . LUTEIN PO Take 1 capsule by mouth daily.    . montelukast (SINGULAIR) 10 MG tablet TAKE 1 TABLET BY MOUTH DAILY. 90 tablet 0  . Multiple Vitamins-Minerals (CENTRUM SILVER 50+MEN) TABS Take 1 tablet  by mouth daily.    . OXYGEN Inhale 3 L into the lungs continuous. 3lpm with rest and 6 lpm with exertion    . pantoprazole (PROTONIX) 40 MG tablet TAKE ONE TABLET BY MOUTH DAILY. 90 tablet 3  . polyethylene glycol (MIRALAX / GLYCOLAX) packet Take 17 g by mouth daily as needed for mild constipation. Mix in 8 oz liquid and drink    . predniSONE (DELTASONE) 20 MG tablet Take 3 tablets (60 mg total) by mouth daily with breakfast. 60 tablet 0  . spironolactone (ALDACTONE) 25 MG tablet TAKE (1/2) TABLET BY MOUTH DAILY. 16 tablet 3  . SYMBICORT 160-4.5 MCG/ACT inhaler INHALE 2 PUFFS FIRST THING IN MORNING AND THEN ANOTHER 2 PUFFS ABOUT 12 HOURS LATER. 10.2 g 11  . tamsulosin (FLOMAX) 0.4 MG CAPS capsule Take 1 capsule (0.4 mg total) by mouth daily. 30 capsule 3  . Tiotropium Bromide Monohydrate (SPIRIVA RESPIMAT) 2.5 MCG/ACT AERS Inhale 2 puffs into the lungs daily. 1 Inhaler 11   No current facility-administered medications on file prior to visit.    Allergies  Allergen Reactions  . Codeine Other (See Comments)    Thought head was going to blow off/ severe headache  . Adhesive [Tape] Other (See Comments)    Band-aids - tears skin off  . Gluten Meal Other (See Comments)    Celiac disease   Social History   Socioeconomic History  . Marital status: Married    Spouse name: Not on file  . Number of children: 3  . Years of education: Not on file  . Highest education level:  Not on file  Occupational History  . 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 Financial resource strain: Not on file  . Food insecurity:    Worry: Not on file    Inability: Not on file  . Transportation needs:    Medical: Not on file    Non-medical: Not on file  Tobacco Use  . Smoking status: Former Smoker    Packs/day: 2.00    Years: 35.00    Pack years: 70.00    Types: Cigarettes    Last attempt to quit: 01/21/1989    Years since quitting: 28.9  . Smokeless tobacco: Former Systems developer    Types: [place] date: 08/23/2001  Substance and Sexual Activity  . Alcohol use: No  . Drug use: No  . Sexual activity: Not on file  Lifestyle  . Physical activity:    Days per week: Not on file    Minutes per session: Not on file  . Stress: Not on file  Relationships  . Social connections:    Talks on phone: Not on file    Gets together: Not on file    Attends religious service: Not on file    Active member of club or organization: Not on file    Attends meetings of clubs or organizations: Not on file    Relationship status: Not on file  . Intimate partner violence:    Fear of current or ex partner: Not on file    Emotionally abused: Not on file    Physically abused: Not on file    Forced sexual activity: Not on file  Other Topics Concern  . Not on file  Social History Narrative  . Not on file      Review of Systems  All other systems reviewed and are negative.  Objective:   Physical Exam  HENT:  Right Ear: Tympanic membrane and ear canal normal.  Left Ear: Tympanic membrane and ear canal normal.  Nose: No mucosal edema or rhinorrhea.  Cardiovascular: Normal rate, regular rhythm and normal heart sounds.  No murmur heard. Pulmonary/Chest: He has decreased breath sounds. He has wheezes. He has no rhonchi. He has rales. He exhibits no tenderness.  Vitals reviewed.        Assessment & Plan:   COPD exacerbation  ([place])  COPD with acute exacerbation ([place]) - Plan: albuterol (PROVENTIL) (2.5 MG/3ML) 0.083% nebulizer solution  Community acquired pneumonia of left lower lobe of lung ([place])  Patient received 1 DuoNeb in clinic.  We discussed his options.  I recommended duo nebs every 6 hours scheduled with continued use of prednisone 60 mg a day, amoxicillin 1 g 3 times daily, and doxycycline 100 mg p.o. twice daily.  However the patient states that minimal ambulation causes him to feel extremely short of breath despite being on up to 5 L of oxygen via nasal cannula.  He is beginning to feel weak and tired.  We discussed hospitalization for IV antibiotics IV steroids and medical surveillance.  Patient request to go to the hospital.  I explained to him that he can certainly go to the hospital given his history as he is failing outpatient therapy.  Patient is going to [facility] emergency room via private transportation

## 2017-12-29 NOTE — H&P (Signed)
History and Physical        Hospital Admission Note Date: 12/29/2017  Patient name: Bruce Travis Medical record number: [id number] Date of birth: [birth year] Age: 83 y.o. Gender: male  PCP: Susy Frizzle, MD    Patient coming from: home   I have reviewed all records in the [place].    Chief Complaint:  Coughing with shortness of breath, pneumonia  HPI: Patient is a 83 year old male with chronic respiratory failure on home O2 3 L at rest, 6 L with exertion, chronic diastolic CHF, CKD stage III, GERD, COPD, presented with progressive shortness of breath with productive cough in the last few weeks.  Patient reported that in the last 2 weeks he has been having increasing cough, shortness of breath, went to his PCP.  He noticed worsening dyspnea and wheezing.  Chest x-ray outpatient showed left lower lobe pneumonia.  Patient was started on steroids, amoxicillin and doxycycline by PCP.  Patient reported that he continued to get worse, unable to sleep due to coughing.  Reported productive cough with grayish-white sputum, increased home O2, patient reported that he did not have nebulizers at home. He reported no fevers or chills.  ED work-up/course:  In ED, temp 97.6, respirate 22, pulse 64, BP 140/80, O2 sats 91% on 3 L  Review of Systems: Positives marked in 'bold' Constitutional: Denies fever, chills, diaphoresis, +poor appetite and fatigue.  HEENT: Denies photophobia, eye pain, redness, hearing loss, ear pain, congestion, sore throat, rhinorrhea, sneezing, mouth sores, trouble swallowing, neck pain, neck stiffness and tinnitus.   Respiratory: Please see HPI Cardiovascular: Denies chest pain, palpitations and leg swelling.  Gastrointestinal: Denies nausea, vomiting, abdominal pain, diarrhea, constipation, blood in stool and abdominal distention.  Genitourinary: Denies  dysuria, urgency, frequency, hematuria, flank pain and difficulty urinating.  Musculoskeletal: Denies myalgias, back pain, joint swelling, arthralgias and gait problem.  Skin: Denies pallor, rash and wound.  Neurological: Denies dizziness, seizures, syncope,  light-headedness, numbness and headaches. Feels generalized weakness Hematological: Denies adenopathy. Easy bruising, personal or family bleeding history  Psychiatric/Behavioral: Denies suicidal ideation, mood changes, confusion, nervousness, sleep disturbance and agitation  Past Medical History: Past Medical History:  Diagnosis Date  . Allergic rhinitis   . Bronchiectasis   . CAD (coronary artery disease)   . Celiac disease   . Chronic heart failure ([place])   . Chronic kidney disease (CKD), stage III (moderate) (HCC)   . COPD (chronic obstructive pulmonary disease) ([place])   . Diverticulosis of colon (without mention of hemorrhage)   . Esophageal reflux   . Family history of adverse reaction to anesthesia    daughter gets PONV  . Family hx of colon cancer   . History of stomach ulcers 1980s  . Hyperlipemia   . Hypertension   . 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 Myocardial infarction [facility])    "previous MI/echo in 12/2015"  . Nephrolithiasis    "got them now; never had OR/scopes" (02/03/2016)  . On home oxygen therapy    "3L; 24/7" (02/21/2017)  . Other diseases of lung, not elsewhere classified   . Personal history of colonic polyps 04/26/2007  hyperplastic   . Pneumonia "several times"    Past Surgical History:  Procedure Laterality Date  . CARDIAC CATHETERIZATION  02/03/2016  . CARDIAC CATHETERIZATION  09/30/2009   non-obstructive CAD w/30% narrowing in prox LAD (Dr. Waunita Schooner)  . CARDIAC CATHETERIZATION  05/04/2001   same at 2011 cath (Dr. Waunita Schooner)  . CARDIAC CATHETERIZATION N/A 02/03/2016   Procedure: Right/Left Heart Cath and Coronary Angiography;  Surgeon: Pixie Casino, MD;  Location:  [facility];  Service: Cardiovascular;  Laterality: N/A;  . EPIGLOTOPLASTY W/ MLB     removal due to carcinoma  . EXCISIONAL HEMORRHOIDECTOMY  2000s  . HAND SURGERY Right 1958   d/t crush injury  . TONSILLECTOMY AND ADENOIDECTOMY    . ULTRASOUND GUIDANCE FOR VASCULAR ACCESS  02/03/2016   Procedure: Ultrasound Guidance For Vascular Access;  Surgeon: Pixie Casino, MD;  Location: [facility];  Service: Cardiovascular;;  . VASECTOMY      Medications: Prior to Admission medications   Medication Sig Start Date End Date Taking? Authorizing Provider  albuterol (PROAIR HFA) 108 (90 Base) MCG/ACT inhaler INHALE 2 PUFFS EVERY 4 HOURS AS NEEDED FOR SHORTNESS OF BREATH. 11/10/16   Susy Frizzle, MD  albuterol (PROVENTIL) (2.5 MG/3ML) 0.083% nebulizer solution Take 3 mLs (2.5 mg total) by nebulization every 4 (four) hours as needed for wheezing or shortness of breath. 12/29/17   Susy Frizzle, MD  ALPRAZolam Duanne Moron) 0.25 MG tablet Take 1 tablet (0.25 mg total) by mouth 2 (two) times daily as needed for anxiety. 11/17/17   Delsa Grana, PA-C  amiodarone (PACERONE) 200 MG tablet Take 0.5 tablets (100 mg total) by mouth daily. 03/03/17   Bensimhon, Shaune Pascal, MD  amoxicillin (AMOXIL) 500 MG capsule Take 2 capsules (1,000 mg total) by mouth 3 (three) times daily. 12/27/17   Susy Frizzle, MD  aspirin EC 81 MG tablet Take 81 mg by mouth daily.    [provider]  azelastine (ASTELIN) 0.1 % nasal spray PLACE 2 SPRAYS INTO EACH NOSTRILS TWICE DAILY AS DIRECTED. 12/13/17   Susy Frizzle, MD  Calcium Carbonate-Vitamin D (CALTRATE 600+D) 600-400 MG-UNIT tablet Take 1 tablet by mouth daily.    [provider]  dextromethorphan-guaiFENesin (MUCINEX DM) 30-600 MG 12hr tablet Take 1 tablet by mouth 2 (two) times daily.    [provider]  doxycycline (VIBRA-TABS) 100 MG tablet Take 1 tablet (100 mg total) by mouth 2 (two) times daily. 12/27/17   Susy Frizzle, MD    ENTRESTO 49-51 MG TAKE (1) TABLET BY MOUTH TWICE DAILY. 05/27/17   Bensimhon, Shaune Pascal, MD  HYDROcodone-homatropine [facility]) 5-1.5 MG/5ML syrup Take 2.5 - 5 mLs PO Q 8 hours PRN for severe coughing. DO NOT TAKE at the same time as XANAX. 11/17/17   Delsa Grana, PA-C  LUTEIN PO Take 1 capsule by mouth daily.    [provider]  montelukast (SINGULAIR) 10 MG tablet TAKE 1 TABLET BY MOUTH DAILY. 05/02/17   Susy Frizzle, MD  Multiple Vitamins-Minerals (CENTRUM SILVER 50+MEN) TABS Take 1 tablet by mouth daily.    [provider]  OXYGEN Inhale 3 L into the lungs continuous. 3lpm with rest and 6 lpm with exertion    [provider]  pantoprazole (PROTONIX) 40 MG tablet TAKE ONE TABLET BY MOUTH DAILY. 09/21/17   Susy Frizzle, MD  polyethylene glycol (MIRALAX / Floria Raveling) packet Take 17 g by mouth daily as needed for mild constipation. Mix  in 8 oz liquid and drink    [provider]  predniSONE (DELTASONE) 20 MG tablet Take 3 tablets (60 mg total) by mouth daily with breakfast. 12/26/17   Susy Frizzle, MD  spironolactone (ALDACTONE) 25 MG tablet TAKE (1/2) TABLET BY MOUTH DAILY. 12/28/17   Bensimhon, Shaune Pascal, MD  SYMBICORT 160-4.5 MCG/ACT inhaler INHALE 2 PUFFS FIRST THING IN MORNING AND THEN ANOTHER 2 PUFFS ABOUT 12 HOURS LATER. 01/12/17   Susy Frizzle, MD  tamsulosin (FLOMAX) 0.4 MG CAPS capsule Take 1 capsule (0.4 mg total) by mouth daily. 11/29/17   Susy Frizzle, MD  Tiotropium Bromide Monohydrate (SPIRIVA RESPIMAT) 2.5 MCG/ACT AERS Inhale 2 puffs into the lungs daily. 04/12/17   Tanda Rockers, MD    Allergies:   Allergies  Allergen Reactions  . Codeine Other (See Comments)    Thought head was going to blow off/ severe headache  . Adhesive [Tape] Other (See Comments)    Band-aids - tears skin off  . Gluten Meal Other (See Comments)    Celiac disease    Social History:  reports that he quit smoking about 28 years ago. His smoking use  included cigarettes. He has a 70.00 pack-year smoking history. He quit smokeless tobacco use about 16 years ago. His smokeless tobacco use included chew. He reports that he does not drink alcohol or use drugs.  Family History: Family History  Problem Relation Age of Onset  . Heart disease Father   . Colon cancer Father   . Prostate cancer Father   . Stroke Mother   . Endometrial cancer Sister   . Stroke Maternal Grandmother   . Cancer Paternal Grandmother     Physical Exam: Blood pressure (!) 115/55, pulse 73, temperature 98 F (36.7 C), temperature source Oral, resp. rate 15, height 5' 6"  (1.676 m), weight 57.6 kg (127 lb), SpO2 90 %. General: Alert, awake, oriented x3, in no acute distress. Eyes: pink conjunctiva,anicteric sclera, pupils equal and reactive to light and accomodation, HEENT: normocephalic, atraumatic, oropharynx clear Neck: supple, no masses or lymphadenopathy, no goiter, no bruits, no JVD CVS: Regular rate and rhythm, without murmurs, rubs or gallops. No lower extremity edema Resp : Breath sounds throughout, mild scattered wheezing GI : Soft, nontender, nondistended, positive bowel sounds, no masses. No hepatomegaly. No hernia.  Musculoskeletal: No clubbing or cyanosis, positive pedal pulses. No contracture. ROM intact  Neuro: Grossly intact, no focal neurological deficits, strength 5/5 upper and lower extremities bilaterally Psych: alert and oriented x 3, normal mood and affect Skin: no rashes or lesions, warm and dry   LABS on Admission: I have personally reviewed all the labs and imagings below    Basic Metabolic Panel: Recent Labs  Lab 12/29/17 1333  NA 139  K 4.3  CL 108  CO2 21*  GLUCOSE 136*  BUN 28*  CREATININE 1.29*  CALCIUM 9.3   Liver Function Tests: Recent Labs  Lab 12/29/17 1333  AST 19  ALT 21  ALKPHOS 59  BILITOT 0.7  PROT 6.0*  ALBUMIN 3.4*   No results for input(s): LIPASE, AMYLASE in the last 168 hours. No results for  input(s): AMMONIA in the last 168 hours. CBC: Recent Labs  Lab 12/29/17 1333  WBC 8.8  NEUTROABS 8.1*  HGB 14.6  HCT 44.5  MCV 101.1*  PLT 260   Cardiac Enzymes: No results for input(s): CKTOTAL, CKMB, CKMBINDEX, TROPONINI in the last 168 hours. BNP: Invalid input(s): POCBNP CBG: No results for input(s):  GLUCAP in the last 168 hours.  Radiological Exams on Admission:  Dg Chest 2 View  Result Date: 12/29/2017 CLINICAL DATA:  Productive cough.  Denies current chest pain. EXAM: CHEST - 2 VIEW COMPARISON:  12/26/2017. FINDINGS: Progression of bibasilar opacities since the previous radiograph, suspected worsening LEFT and developing RIGHT lower lobe pneumonia. No effusion or pneumothorax. Stable cardiac silhouette. Calcified tortuous aorta. No pneumothorax. Hyperaeration. Large granuloma RIGHT lung base. IMPRESSION: Worsening aeration, consistent with progressive pneumonia. Electronically Signed   By: Staci Righter M.D.   On: 12/29/2017 16:08      EKG: No  EKG available   Assessment/Plan Principal Problem:   Acute on chronic respiratory failure with hypoxia (HCC) with COPD exacerbation, CAP bilateral, outpatient failure to treatment - admit to tele, place on scheduled and PRN nebs, IV steroids,  -Placed on Brovana, Pulmicort, flutter valve, O2 -Start IV Rocephin, IV Zithromax -Obtain blood cultures, sputum cultures, urine Legionella antigen, urine strep antigen -Chest x-ray repeated today showed worsening aeration consistent with progressive left and developing right lower lobe pneumonia.  Active Problems:   GERD - Continue Protonix  Essential hypertension - currently stable, continue Entresto     CAD (coronary artery disease) with ischemic cardiomyopathy -Currently stable, no chest pain, continue aspirin, Entresto, amiodarone    Chronic kidney disease (CKD), stage III (moderate) (HCC) -Creatinine currently at baseline   BPH  - continue Flomax  Lactic  acidosis -Improving.  Hold Spironolactone.   Diastolic CHF, chronic 2D echo 05/2017 showed EF of 55 to 60%, currently compensated, no peripheral edema Continue Entresto, hold spironolactone.  No beta-blocker secondary to severe COPD  PVC related cardiomyopathy Reviewed cardiology office notes, per Dr. Haroldine Laws, recommended to continue amiodarone   DVT prophylaxis: Lovenox  CODE STATUS: DNR status but okay for BiPAP if needed  Consults called: None  Family Communication: Admission, patients condition and plan of care including tests being ordered have been discussed with the patient, daughter and wife who indicates understanding and agree with the plan and Code Status  Admission status:   Disposition plan: Further plan will depend as patient's clinical course evolves and further radiologic and laboratory data become available.    At the time of admission, it appears that the appropriate admission status for this patient is INPATIENT . This is judged to be reasonable and necessary in order to provide the required intensity of service to ensure the patient's safety given the presenting symptoms, worsening pneumonia, productive cough, shortness of breath, physical exam findings, and initial radiographic and laboratory data in the context of their chronic comorbidities.  The medical decision making on this patient was of high complexity and the patient is at high risk for clinical deterioration, therefore this is a level 3 visit.     Time Spent on Admission: 79mns    Avani Sensabaugh M.D. [facility] 12/29/2017, 4:27 PM Pager: [phone] If 7PM-7AM, please contact night-coverage [email or web address] Password [email or web address]
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
## 2017-12-29 NOTE — ED Notes (Signed)
ED Provider at bedside. 

## 2017-12-29 NOTE — ED Triage Notes (Signed)
Pt states he has been coughing for a few weeks. Productive cough- gray mucous. Pt went to his primary MD and was diagnosed with PNA. Started steroids and antibiotics on Tuesday. Pt came here today because he continues to cough, and is on O2 at home, but states he is requiring more O2.

## 2017-12-30 LAB — BASIC METABOLIC PANEL
Anion gap: 10 (ref 5–15)
BUN: 27 mg/dL — AB (ref 6–20)
CHLORIDE: 106 mmol/L (ref 101–111)
CO2: 25 mmol/L (ref 22–32)
Calcium: 9.3 mg/dL (ref 8.9–10.3)
Creatinine, Ser: 1.44 mg/dL — ABNORMAL HIGH (ref 0.61–1.24)
GFR calc Af Amer: 51 mL/min — ABNORMAL LOW (ref >60)
GFR calc non Af Amer: 44 mL/min — ABNORMAL LOW (ref >60)
Glucose, Bld: 139 mg/dL — ABNORMAL HIGH (ref 65–99)
Potassium: 4.7 mmol/L (ref 3.5–5.1)
SODIUM: 141 mmol/L (ref 135–145)

## 2017-12-30 LAB — CBC
HCT: 41.9 % (ref 39.0–52.0)
Hemoglobin: 13.5 g/dL (ref 13.0–17.0)
MCH: 32.3 pg (ref 26.0–34.0)
MCHC: 32.2 g/dL (ref 30.0–36.0)
MCV: 100.2 fL — AB (ref 78.0–100.0)
PLATELETS: 246 10*3/uL (ref 150–400)
RBC: 4.18 MIL/uL — ABNORMAL LOW (ref 4.22–5.81)
RDW: 14 % (ref 11.5–15.5)
WBC: 8.7 10*3/uL (ref 4.0–10.5)

## 2017-12-30 LAB — STREP PNEUMONIAE URINARY ANTIGEN: Strep Pneumo Urinary Antigen: NEGATIVE

## 2017-12-30 LAB — HIV ANTIBODY (ROUTINE TESTING W REFLEX): HIV Screen 4th Generation wRfx: NONREACTIVE

## 2017-12-30 MED ORDER — IPRATROPIUM-ALBUTEROL 0.5-2.5 (3) MG/3ML IN SOLN
3.0000 mL | Freq: Three times a day (TID) | RESPIRATORY_TRACT | Status: DC
  Administered 2017-12-30 – 2017-12-31 (×4): 3 mL via RESPIRATORY_TRACT
  Filled 2017-12-30 (×5): qty 3

## 2017-12-30 MED ORDER — SODIUM CHLORIDE 0.9 % IV SOLN
500.0000 mg | INTRAVENOUS | Status: DC
  Administered 2017-12-30 – 2017-12-31 (×2): 500 mg via INTRAVENOUS
  Filled 2017-12-30 (×2): qty 500

## 2017-12-30 MED ORDER — METHYLPREDNISOLONE SODIUM SUCC 40 MG IJ SOLR
40.0000 mg | Freq: Two times a day (BID) | INTRAMUSCULAR | Status: DC
  Administered 2017-12-30 – 2017-12-31 (×2): 40 mg via INTRAVENOUS
  Filled 2017-12-30 (×2): qty 1

## 2017-12-30 NOTE — Progress Notes (Signed)
PROGRESS NOTE    Bruce Travis  ZWC:[id number] DOB: 07-14-1935 DOA: 12/29/2017 PCP: Susy Frizzle, MD   Brief Narrative: Patient is a 83 year old male with past medical history of chronic respiratory failure on home oxygen 3 liters at rest, chronic diastolic CHF, CKD stage III, GERD, COPD who presented with progressive shortness of breath with chronic cough .Chest x-ray done as an outpatient showed left lower lobe pneumonia. Patient was started on steroids, antibiotic by his PCP but he continued to get worse so presented to the emergency department.Patient was admitted for the management of pneumonia and COPD exacerbation.  Assessment & Plan:   Principal Problem:   Acute respiratory failure with hypoxia (HCC) Active Problems:   GERD   COPD exacerbation (HCC)   Hypertension   Community acquired pneumonia of left lower lobe of lung (HCC)   CAD (coronary artery disease)   Chronic kidney disease (CKD), stage III (moderate) (HCC)  Acute on chronic respiratory failure with hypoxia: Secondary to COPD and pneumonia. Continue supplemental oxygen, bronchodilators, steroids, antibiotics.  Community-acquired pneumonia: Chest x-ray showed bilateral pneumonia.  Patient failed outpatient oral antibiotic.  Started on azithromycin and ceftriaxone. Will check blood cultures, sputum cultures, urine Streptococcal antigen negative.  COPD: Follows with pulmonology as an outpatient.  On nebulization machine, bronchodilators at home.  On 3 L oxygen at rest.  GERD: Continue Protonix   Hypertension: Currently blood pressure stable.  Continue home meds.  Coronary artery disease with ischemic cardiomyopathy: Currently stable.  No chest pain.  Continue aspirin, Entresto, amiodarone  Chronic kidney disease stage III: Currently creatinine on baseline  History of BPH: Continue Flomax  Lactic acidosis: Improved.  Spironolactone held.  Diastolic CHF: 2D echo on 23/53 showed EF 55 to 60%.  Currently he is  euvolemic.  No peripheral edema.  Continue Entresto.  Spironolactone on hold.  Not on beta-blocker therapy due to severe COPD.  PVC related  Cardiomyopathy: On amiodarone.  Follows with cardiology   DVT prophylaxis: Lovenox Code Status: DNR Family Communication: Family member present at the bedside Disposition Plan: Likely home in 1 to 2 days   Consultants: None  Procedures: None  Antimicrobials: Ceftriaxone and azithromycin since 12/29/2017  Subjective: Patient seen and examined at bedside this morning.  His respiratory status has improved.  Feels better than yesterday.  Objective: Vitals:   12/29/17 1958 12/30/17 0006 12/30/17 0915 12/30/17 0933  BP:  99/62 128/71   Pulse: 71 66 68   Resp: 18 18 20    Temp:  98.2 F (36.8 C) 98.2 F (36.8 C)   TempSrc:  Oral Oral   SpO2: 91% 91% 93% 91%  Weight:      Height:        Intake/Output Summary (Last 24 hours) at 12/30/2017 1441 Last data filed at 12/30/2017 1232 Gross per 24 hour  Intake 340 ml  Output 825 ml  Net -485 ml   Filed Weights   12/29/17 1533 12/29/17 1730  Weight: 57.6 kg (127 lb) 55.2 kg (121 lb 11.1 oz)    Examination:  General exam: Appears calm and comfortable ,Not in distress,thin built HEENT:PERRL,Oral mucosa moist, Ear/Nose normal on gross exam Respiratory system: Bilateral decreased air entry Cardiovascular system: S1 & S2 heard, RRR. No JVD, murmurs, rubs, gallops or clicks. No pedal edema. Gastrointestinal system: Abdomen is nondistended, soft and nontender. No organomegaly or masses felt. Normal bowel sounds heard. Central nervous system: Alert and oriented. No focal neurological deficits. Extremities: No edema, no clubbing ,no cyanosis, distal peripheral  pulses palpable. Skin: No rashes, lesions or ulcers,no icterus ,no pallor MSK: Normal muscle bulk,tone ,power Psychiatry: Judgement and insight appear normal. Mood & affect appropriate.     Data Reviewed: I have personally reviewed following  labs and imaging studies  CBC: Recent Labs  Lab 12/29/17 1333 12/30/17 0338  WBC 8.8 8.7  NEUTROABS 8.1*  --   HGB 14.6 13.5  HCT 44.5 41.9  MCV 101.1* 100.2*  PLT 260 619   Basic Metabolic Panel: Recent Labs  Lab 12/29/17 1333 12/30/17 0338  NA 139 141  K 4.3 4.7  CL 108 106  CO2 21* 25  GLUCOSE 136* 139*  BUN 28* 27*  CREATININE 1.29* 1.44*  CALCIUM 9.3 9.3   GFR: Estimated Creatinine Clearance: 31.4 mL/min (A) (by C-G formula based on SCr of 1.44 mg/dL (H)). Liver Function Tests: Recent Labs  Lab 12/29/17 1333  AST 19  ALT 21  ALKPHOS 59  BILITOT 0.7  PROT 6.0*  ALBUMIN 3.4*   No results for input(s): LIPASE, AMYLASE in the last 168 hours. No results for input(s): AMMONIA in the last 168 hours. Coagulation Profile: No results for input(s): INR, PROTIME in the last 168 hours. Cardiac Enzymes: No results for input(s): CKTOTAL, CKMB, CKMBINDEX, TROPONINI in the last 168 hours. BNP (last 3 results) No results for input(s): PROBNP in the last 8760 hours. HbA1C: No results for input(s): HGBA1C in the last 72 hours. CBG: No results for input(s): GLUCAP in the last 168 hours. Lipid Profile: No results for input(s): CHOL, HDL, LDLCALC, TRIG, CHOLHDL, LDLDIRECT in the last 72 hours. Thyroid Function Tests: No results for input(s): TSH, T4TOTAL, FREET4, T3FREE, THYROIDAB in the last 72 hours. Anemia Panel: No results for input(s): VITAMINB12, FOLATE, FERRITIN, TIBC, IRON, RETICCTPCT in the last 72 hours. Sepsis Labs: Recent Labs  Lab 12/29/17 1350 12/29/17 1548  LATICACIDVEN 3.15* 1.91*    Recent Results (from the past 240 hour(s))  Culture, blood (routine x 2) Call MD if unable to obtain prior to antibiotics being given     Status: None (Preliminary result)   Collection Time: 12/29/17  4:42 PM  Result Value Ref Range Status   Specimen Description BLOOD RIGHT ANTECUBITAL  Final   Special Requests   Final    BOTTLES DRAWN AEROBIC AND ANAEROBIC Blood  Culture adequate volume   Culture   Final    NO GROWTH < 24 HOURS Performed at [facility], [address] [place], [place] [address]    Report Status PENDING  Incomplete  Culture, blood (routine x 2) Call MD if unable to obtain prior to antibiotics being given     Status: None (Preliminary result)   Collection Time: 12/29/17  5:26 PM  Result Value Ref Range Status   Specimen Description BLOOD LEFT ANTECUBITAL  Final   Special Requests   Final    BOTTLES DRAWN AEROBIC ONLY Blood Culture adequate volume   Culture   Final    NO GROWTH < 24 HOURS Performed at [facility], [address], [place], [place] [address]    Report Status PENDING  Incomplete         Radiology Studies: Dg Chest 2 View  Result Date: 12/29/2017 CLINICAL DATA:  Productive cough.  Denies current chest pain. EXAM: CHEST - 2 VIEW COMPARISON:  12/26/2017. FINDINGS: Progression of bibasilar opacities since the previous radiograph, suspected worsening LEFT and developing RIGHT lower lobe pneumonia. No effusion or pneumothorax. Stable cardiac silhouette. Calcified tortuous aorta. No  pneumothorax. Hyperaeration. Large granuloma RIGHT lung base. IMPRESSION: Worsening aeration, consistent with progressive pneumonia. Electronically Signed   By: Staci Righter M.D.   On: 12/29/2017 16:08        Scheduled Meds: . amiodarone  100 mg Oral Daily  . arformoterol  15 mcg Nebulization BID  . aspirin EC  81 mg Oral Daily  . azelastine  1 spray Each Nare BID  . benzonatate  100 mg Oral TID  . budesonide (PULMICORT) nebulizer solution  0.25 mg Nebulization BID  . calcium-vitamin D  1 tablet Oral Daily  . enoxaparin (LOVENOX) injection  30 mg Subcutaneous Q24H  . guaiFENesin  1,200 mg Oral BID  . ipratropium-albuterol  3 mL Nebulization TID  . methylPREDNISolone (SOLU-MEDROL) injection  60 mg Intravenous Q8H  . montelukast  10 mg Oral QHS  . multivitamin with minerals  1 tablet Oral Daily  . pantoprazole  40 mg  Oral Daily  . sacubitril-valsartan  1 tablet Oral BID  . tamsulosin  0.4 mg Oral Daily   Continuous Infusions: . azithromycin 500 mg (12/30/17 1237)  . cefTRIAXone (ROCEPHIN)  IV Stopped (12/29/17 2020)     LOS: 1 day    Time spent: 35 mins.More than 50% of that time was spent in counseling and/or coordination of care.      Shelly Coss, MD [facility] Pager [phone]  If 7PM-7AM, please contact night-coverage [email or web address] Password [email or web address] 12/30/2017, 2:41 PM

## 2017-12-31 LAB — BASIC METABOLIC PANEL
Anion gap: 8 (ref 5–15)
BUN: 30 mg/dL — AB (ref 6–20)
CALCIUM: 9.1 mg/dL (ref 8.9–10.3)
CO2: 23 mmol/L (ref 22–32)
CREATININE: 1.22 mg/dL (ref 0.61–1.24)
Chloride: 109 mmol/L (ref 101–111)
GFR calc Af Amer: >60 mL/min (ref >60)
GFR, EST NON AFRICAN AMERICAN: 54 mL/min — AB (ref >60)
GLUCOSE: 144 mg/dL — AB (ref 65–99)
Potassium: 4.4 mmol/L (ref 3.5–5.1)
Sodium: 140 mmol/L (ref 135–145)

## 2017-12-31 LAB — URINE CULTURE: Culture: NO GROWTH

## 2017-12-31 MED ORDER — AZITHROMYCIN 500 MG PO TABS: 500.0000 mg | ORAL_TABLET | Freq: Every day | ORAL | 0 refills | Status: AC

## 2017-12-31 MED ORDER — CEFDINIR 300 MG PO CAPS: 300.0000 mg | ORAL_CAPSULE | Freq: Two times a day (BID) | ORAL | 0 refills | Status: AC

## 2017-12-31 MED ORDER — PREDNISONE 20 MG PO TABS: 40.0000 mg | ORAL_TABLET | Freq: Every day | ORAL | 0 refills | Status: AC

## 2017-12-31 NOTE — Progress Notes (Signed)
..  Patient given all discharge paper work. All pages reviewed and subsequent questions answered. Patients telemetry and peripheral IV's discontinued without complications. Patients belongings returned. Patient placed in wheel chair and escorted by staff member to pick up location.  English as a second language teacher

## 2017-12-31 NOTE — Discharge Summary (Addendum)
Physician Discharge Summary  Bruce Travis IEP:[id number] DOB: 12/13/1934 DOA: 12/29/2017  PCP: Susy Frizzle, MD  Admit date: 12/29/2017 Discharge date: 12/31/2017  Admitted From: Home Disposition:  Home  Discharge Condition:Stable CODE STATUS: DNR Diet recommendation: Heart Healthy  Brief/Interim Summary:  Patient is a 83 year old male with past medical history of chronic respiratory failure on home oxygen 3 liters at rest, chronic diastolic CHF, CKD stage III, GERD, COPD who presented with progressive shortness of breath with chronic cough .Chest x-ray done as an outpatient showed left lower lobe pneumonia. Patient was started on steroids, antibiotic by his PCP but he continued to get worse so presented to the emergency department.Patient was admitted for the management of pneumonia and COPD exacerbation. Patient was started on broad-spectrum antibiotics on presentation.  His respiratory status gradually improved. Patient is on 3 L of oxygen via nasal cannula at home and currently he is on baseline.  This morning he feels comfortable.  Coughing has improved.  He is afebrile.  Cultures are negative so far. He is stable to be discharged to home with oral antibiotics and steroids. We recommend him to follow-up with his pulmonologist as an outpatient.  Following problems were addressed during his hospitalization:  Acute on chronic respiratory failure with hypoxia: Secondary to COPD and pneumonia. Continue supplemental oxygen at home, bronchodilators, steroids, antibiotics.  Community-acquired pneumonia: Chest x-ray showed bilateral pneumonia.  Patient failed outpatient oral antibiotic.  Started on azithromycin and ceftriaxone here.  Antibiotics changed to oral on discharge Negative  blood cultures, sputum cultures so far.Urine Streptococcal antigen negative.  COPD: Follows with pulmonology as an outpatient.  On nebulization machine, bronchodilators at home.  On 3 L oxygen at  rest.  GERD: Continue Protonix   Hypertension: Currently blood pressure stable.  Continue home meds.  Coronary artery disease with ischemic cardiomyopathy: Currently stable.  No chest pain.  Continue aspirin, Entresto, amiodarone  Chronic kidney disease stage III: Currently creatinine on baseline  History of BPH: Continue Flomax  Lactic acidosis: Improved.  Diastolic CHF: 2D echo on 66/06 showed EF 55 to 60%.  Currently he is euvolemic.  No peripheral edema.  Continue Entresto.  Spironolactone on hold.  Not on beta-blocker therapy due to severe COPD.  PVC related  Cardiomyopathy: On amiodarone.  Follows with cardiology   Discharge Diagnoses:  Principal Problem:   Acute respiratory failure with hypoxia ([place]) Active Problems:   GERD   COPD exacerbation ([place])   Hypertension   Community acquired pneumonia of left lower lobe of lung ([place])   CAD (coronary artery disease)   Chronic kidney disease (CKD), stage III (moderate) ([facility])    Discharge Instructions  Discharge Instructions    Diet - low sodium heart healthy   Complete by:  As directed    Discharge instructions   Complete by:  As directed    1) Follow up with your PCP in a week.Do a BMP test during the follow up. 2) Follow up with your pulmonologist as an outpatient in 2 weeks. 3) Take prescribed medications as instructed.Continue your other home medications.   Increase activity slowly   Complete by:  As directed      Allergies as of 12/31/2017      Reactions   Codeine Other (See Comments)   Thought head was going to blow off/ severe headache   Adhesive [tape] Other (See Comments)   Band-aids - tears skin off   Gluten Meal Other (See Comments)   Celiac disease  Medication List    STOP taking these medications   amoxicillin 500 MG capsule Commonly known as:  AMOXIL   doxycycline 100 MG tablet Commonly known as:  VIBRA-TABS     TAKE these medications   albuterol 108 (90 Base) MCG/ACT  inhaler Commonly known as:  PROAIR HFA INHALE 2 PUFFS EVERY 4 HOURS AS NEEDED FOR SHORTNESS OF BREATH.   albuterol (2.5 MG/3ML) 0.083% nebulizer solution Commonly known as:  PROVENTIL Take 3 mLs (2.5 mg total) by nebulization every 4 (four) hours as needed for wheezing or shortness of breath.   ALPRAZolam 0.25 MG tablet Commonly known as:  XANAX Take 1 tablet (0.25 mg total) by mouth 2 (two) times daily as needed for anxiety.   amiodarone 200 MG tablet Commonly known as:  PACERONE Take 0.5 tablets (100 mg total) by mouth daily.   aspirin EC 81 MG tablet Take 81 mg by mouth daily.   azelastine 0.1 % nasal spray Commonly known as:  ASTELIN PLACE 2 SPRAYS INTO EACH NOSTRILS TWICE DAILY AS DIRECTED.   azithromycin 500 MG tablet Commonly known as:  ZITHROMAX Take 1 tablet (500 mg total) by mouth daily for 1 day. Take 1 tablet daily for 3 days. Start taking on:  01/01/2018   CALTRATE 600+D 600-400 MG-UNIT tablet Generic drug:  Calcium Carbonate-Vitamin D Take 1 tablet by mouth daily.   cefdinir 300 MG capsule Commonly known as:  OMNICEF Take 1 capsule (300 mg total) by mouth 2 (two) times daily for 5 days.   CENTRUM SILVER 50+MEN Tabs Take 1 tablet by mouth daily.   dextromethorphan-guaiFENesin 30-600 MG 12hr tablet Commonly known as:  MUCINEX DM Take 1 tablet by mouth 2 (two) times daily.   ENTRESTO 49-51 MG Generic drug:  sacubitril-valsartan TAKE (1) TABLET BY MOUTH TWICE DAILY.   HYDROcodone-homatropine 5-1.5 MG/5ML syrup Commonly known as:  HYCODAN Take 2.5 - 5 mLs PO Q 8 hours PRN for severe coughing. DO NOT TAKE at the same time as XANAX.   LUTEIN PO Take 1 capsule by mouth daily.   montelukast 10 MG tablet Commonly known as:  SINGULAIR TAKE 1 TABLET BY MOUTH DAILY. What changed:    how much to take  how to take this  when to take this   OXYGEN Inhale 3 L into the lungs continuous. 3lpm with rest and 6 lpm with exertion   pantoprazole 40 MG  tablet Commonly known as:  PROTONIX TAKE ONE TABLET BY MOUTH DAILY. What changed:    how much to take  how to take this  when to take this   polyethylene glycol packet Commonly known as:  MIRALAX / GLYCOLAX Take 17 g by mouth daily as needed for mild constipation. Mix in 8 oz liquid and drink   predniSONE 20 MG tablet Commonly known as:  DELTASONE Take 2 tablets (40 mg total) by mouth daily with breakfast for 4 days. Start taking on:  01/01/2018 What changed:  how much to take   spironolactone 25 MG tablet Commonly known as:  ALDACTONE TAKE (1/2) TABLET BY MOUTH DAILY. What changed:  See the new instructions.   SYMBICORT 160-4.5 MCG/ACT inhaler Generic drug:  budesonide-formoterol INHALE 2 PUFFS FIRST THING IN MORNING AND THEN ANOTHER 2 PUFFS ABOUT 12 HOURS LATER.   tamsulosin 0.4 MG Caps capsule Commonly known as:  FLOMAX Take 1 capsule (0.4 mg total) by mouth daily.   Tiotropium Bromide Monohydrate 2.5 MCG/ACT Aers Commonly known as:  SPIRIVA RESPIMAT Inhale 2 puffs into  the lungs daily.       Allergies  Allergen Reactions  . Codeine Other (See Comments)    Thought head was going to blow off/ severe headache  . Adhesive [Tape] Other (See Comments)    Band-aids - tears skin off  . Gluten Meal Other (See Comments)    Celiac disease    Consultations:  None   Procedures/Studies: Dg Chest 2 View  Result Date: 12/29/2017 CLINICAL DATA:  Productive cough.  Denies current chest pain. EXAM: CHEST - 2 VIEW COMPARISON:  12/26/2017. FINDINGS: Progression of bibasilar opacities since the previous radiograph, suspected worsening LEFT and developing RIGHT lower lobe pneumonia. No effusion or pneumothorax. Stable cardiac silhouette. Calcified tortuous aorta. No pneumothorax. Hyperaeration. Large granuloma RIGHT lung base. IMPRESSION: Worsening aeration, consistent with progressive pneumonia. Electronically Signed   By: Staci Righter M.D.   On: 12/29/2017 16:08   Dg Chest  2 View  Result Date: 12/26/2017 CLINICAL DATA:  Cough, increased shortness of breath over the last 2 weeks EXAM: CHEST - 2 VIEW COMPARISON:  Chest x-ray of 11/15/2016 FINDINGS: There are more prominent markings at the left lung base posteriorly and pneumonia is a definite consideration. The lungs remain hyperaerated with flattened hemidiaphragms and increased AP diameter consistent with emphysematous change. Stable pleural-parenchymal scarring in the apices is noted. Calcified granuloma in the right lung base is unchanged. Mediastinal and hilar contours are stable and the heart is within normal limits in size. No acute bony abnormality is seen. IMPRESSION: 1. Prominent markings at the left lung base may represent focus of pneumonia. Recommend continued follow-up. 2. Hyperaeration suggests emphysematous change. Electronically Signed   By: Ivar Drape M.D.   On: 12/26/2017 16:57       Subjective: Patient seen and examined the bedside this morning.  Remains comfortable.  Denies any complaints.  Willing to go home.  Discharge Exam: Vitals:   12/31/17 0831 12/31/17 0900  BP:    Pulse:    Resp:    Temp:    SpO2: 91% 94%   Vitals:   12/30/17 2307 12/31/17 0808 12/31/17 0831 12/31/17 0900  BP: 130/61 127/73    Pulse: 73 60    Resp: 16 18    Temp: 97.8 F (36.6 C) 97.6 F (36.4 C)    TempSrc: Oral Oral    SpO2: 93% 94% 91% 94%  Weight:      Height:        General: Pt is alert, awake, not in acute distress Cardiovascular: RRR, S1/S2 +, no rubs, no gallops Respiratory: Bilateral mildly decreased air entry, no wheezes Abdominal: Soft, NT, ND, bowel sounds + Extremities: no edema, no cyanosis    The results of significant diagnostics from this hospitalization (including imaging, microbiology, ancillary and laboratory) are listed below for reference.     Microbiology: Recent Results (from the past 240 hour(s))  Urine culture     Status: None   Collection Time: 12/29/17  3:00 PM   Result Value Ref Range Status   Specimen Description URINE, RANDOM  Final   Special Requests NONE  Final   Culture   Final    NO GROWTH Performed at [facility], [address], [place], [place] [address]    Report Status 12/31/2017 FINAL  Final  Culture, blood (routine x 2) Call MD if unable to obtain prior to antibiotics being given     Status: None (Preliminary result)   Collection Time: 12/29/17  4:42 PM  Result Value Ref Range  Status   Specimen Description BLOOD RIGHT ANTECUBITAL  Final   Special Requests   Final    BOTTLES DRAWN AEROBIC AND ANAEROBIC Blood Culture adequate volume   Culture   Final    NO GROWTH 2 DAYS Performed at [facility], [address] [place], [place] [address]    Report Status PENDING  Incomplete  Culture, blood (routine x 2) Call MD if unable to obtain prior to antibiotics being given     Status: None (Preliminary result)   Collection Time: 12/29/17  5:26 PM  Result Value Ref Range Status   Specimen Description BLOOD LEFT ANTECUBITAL  Final   Special Requests   Final    BOTTLES DRAWN AEROBIC ONLY Blood Culture adequate volume   Culture   Final    NO GROWTH 2 DAYS Performed at [facility], [address], [place], [place] [address]    Report Status PENDING  Incomplete     Labs: BNP (last 3 results) Recent Labs    02/21/17 1200 02/21/17 1551  BNP 74.8 63.8   Basic Metabolic Panel: Recent Labs  Lab 12/29/17 1333 12/30/17 0338 12/31/17 0256  NA 139 141 140  K 4.3 4.7 4.4  CL 108 106 109  CO2 21* 25 23  GLUCOSE 136* 139* 144*  BUN 28* 27* 30*  CREATININE 1.29* 1.44* 1.22  CALCIUM 9.3 9.3 9.1   Liver Function Tests: Recent Labs  Lab 12/29/17 1333  AST 19  ALT 21  ALKPHOS 59  BILITOT 0.7  PROT 6.0*  ALBUMIN 3.4*   No results for input(s): LIPASE, AMYLASE in the last 168 hours. No results for input(s): AMMONIA in the last 168 hours. CBC: Recent Labs  Lab 12/29/17 1333 12/30/17 0338  WBC 8.8 8.7   NEUTROABS 8.1*  --   HGB 14.6 13.5  HCT 44.5 41.9  MCV 101.1* 100.2*  PLT 260 246   Cardiac Enzymes: No results for input(s): CKTOTAL, CKMB, CKMBINDEX, TROPONINI in the last 168 hours. BNP: Invalid input(s): POCBNP CBG: No results for input(s): GLUCAP in the last 168 hours. D-Dimer No results for input(s): DDIMER in the last 72 hours. Hgb A1c No results for input(s): HGBA1C in the last 72 hours. Lipid Profile No results for input(s): CHOL, HDL, LDLCALC, TRIG, CHOLHDL, LDLDIRECT in the last 72 hours. Thyroid function studies No results for input(s): TSH, T4TOTAL, T3FREE, THYROIDAB in the last 72 hours.  Invalid input(s): FREET3 Anemia work up No results for input(s): VITAMINB12, FOLATE, FERRITIN, TIBC, IRON, RETICCTPCT in the last 72 hours. Urinalysis    Component Value Date/Time   COLORURINE YELLOW 12/29/2017 1500   APPEARANCEUR CLEAR 12/29/2017 1500   LABSPEC 1.018 12/29/2017 1500   PHURINE 5.0 12/29/2017 1500   GLUCOSEU NEGATIVE 12/29/2017 1500   GLUCOSEU NEGATIVE 09/01/2006 1033   HGBUR NEGATIVE 12/29/2017 1500   BILIRUBINUR NEGATIVE 12/29/2017 1500   KETONESUR NEGATIVE 12/29/2017 1500   PROTEINUR NEGATIVE 12/29/2017 1500   UROBILINOGEN 0.2 01/10/2012 1730   NITRITE NEGATIVE 12/29/2017 1500   LEUKOCYTESUR NEGATIVE 12/29/2017 1500   Sepsis Labs Invalid input(s): PROCALCITONIN,  WBC,  LACTICIDVEN Microbiology Recent Results (from the past 240 hour(s))  Urine culture     Status: None   Collection Time: 12/29/17  3:00 PM  Result Value Ref Range Status   Specimen Description URINE, RANDOM  Final   Special Requests NONE  Final   Culture   Final    NO GROWTH Performed at [facility], [place]  [address] [place], [place] [address]    Report Status 12/31/2017 FINAL  Final  Culture, blood (routine x 2) Call MD if unable to obtain prior to antibiotics being given     Status: None (Preliminary result)   Collection Time: 12/29/17  4:42 PM  Result Value Ref Range  Status   Specimen Description BLOOD RIGHT ANTECUBITAL  Final   Special Requests   Final    BOTTLES DRAWN AEROBIC AND ANAEROBIC Blood Culture adequate volume   Culture   Final    NO GROWTH 2 DAYS Performed at [facility], [place] [address] [place], [place] [address]    Report Status PENDING  Incomplete  Culture, blood (routine x 2) Call MD if unable to obtain prior to antibiotics being given     Status: None (Preliminary result)   Collection Time: 12/29/17  5:26 PM  Result Value Ref Range Status   Specimen Description BLOOD LEFT ANTECUBITAL  Final   Special Requests   Final    BOTTLES DRAWN AEROBIC ONLY Blood Culture adequate volume   Culture   Final    NO GROWTH 2 DAYS Performed at [facility], [address], [place], [place] [address]    Report Status PENDING  Incomplete     Time coordinating discharge: 35 minutes  SIGNED:   Shelly Coss, MD  [facility] 12/31/2017, 12:00 PM Pager [phone]  If 7PM-7AM, please contact night-coverage [email or web address] Password [email or web address]

## 2018-01-02 ENCOUNTER — Telehealth: Payer: Self-pay | Admitting: Family Medicine

## 2018-01-02 ENCOUNTER — Other Ambulatory Visit: Payer: Self-pay | Admitting: Family Medicine

## 2018-01-02 DIAGNOSIS — J441 Chronic obstructive pulmonary disease with (acute) exacerbation: Secondary | ICD-10-CM

## 2018-01-02 MED ORDER — ALBUTEROL SULFATE (2.5 MG/3ML) 0.083% IN NEBU: 2.5000 mg | INHALATION_SOLUTION | RESPIRATORY_TRACT | 5 refills | Status: DC | PRN

## 2018-01-02 NOTE — Telephone Encounter (Signed)
Pt was d/c from hospital on 12/31/17. Spoke to pt and answered all questions. Pt is stable and follow up scheduled for 01/05/18.

## 2018-01-03 ENCOUNTER — Encounter (HOSPITAL_COMMUNITY): Payer: Self-pay

## 2018-01-03 LAB — CULTURE, BLOOD (ROUTINE X 2)
Culture: NO GROWTH
Culture: NO GROWTH
SPECIAL REQUESTS: ADEQUATE
Special Requests: ADEQUATE

## 2018-01-05 ENCOUNTER — Ambulatory Visit: Payer: Medicare Other | Admitting: Family Medicine

## 2018-01-05 ENCOUNTER — Encounter (HOSPITAL_COMMUNITY): Payer: Self-pay

## 2018-01-05 ENCOUNTER — Encounter: Payer: Self-pay | Admitting: Family Medicine

## 2018-01-05 ENCOUNTER — Ambulatory Visit
Admission: RE | Admit: 2018-01-05 | Discharge: 2018-01-05 | Disposition: A | Discharge status: Home / Self Care | Payer: Medicare Other | Source: Ambulatory Visit | Attending: Family Medicine | Admitting: Family Medicine

## 2018-01-05 VITALS — BP 150/62 | HR 52 | Temp 97.7°F | Resp 22 | Ht 66.5 in | Wt 128.0 lb

## 2018-01-05 DIAGNOSIS — J181 Lobar pneumonia, unspecified organism: Secondary | ICD-10-CM

## 2018-01-05 DIAGNOSIS — J189 Pneumonia, unspecified organism: Secondary | ICD-10-CM

## 2018-01-05 DIAGNOSIS — J441 Chronic obstructive pulmonary disease with (acute) exacerbation: Secondary | ICD-10-CM | POA: Diagnosis not present

## 2018-01-05 DIAGNOSIS — R0600 Dyspnea, unspecified: Secondary | ICD-10-CM

## 2018-01-05 DIAGNOSIS — Z09 Encounter for follow-up examination after completed treatment for conditions other than malignant neoplasm: Secondary | ICD-10-CM | POA: Diagnosis not present

## 2018-01-05 LAB — COMPLETE METABOLIC PANEL WITH GFR
AG RATIO: 1.9 (calc) (ref 1.0–2.5)
ALBUMIN MSPROF: 3.5 g/dL — AB (ref 3.6–5.1)
ALT: 28 U/L (ref 9–46)
AST: 16 U/L (ref 10–35)
Alkaline phosphatase (APISO): 64 U/L (ref 40–115)
BUN / CREAT RATIO: 23 (calc) — AB (ref 6–22)
BUN: 31 mg/dL — ABNORMAL HIGH (ref 7–25)
CALCIUM: 9.5 mg/dL (ref 8.6–10.3)
CO2: 27 mmol/L (ref 20–32)
Chloride: 108 mmol/L (ref 98–110)
Creat: 1.32 mg/dL — ABNORMAL HIGH (ref 0.70–1.11)
GFR, EST AFRICAN AMERICAN: 58 mL/min/{1.73_m2} — AB (ref >=60)
GFR, EST NON AFRICAN AMERICAN: 50 mL/min/{1.73_m2} — AB (ref >=60)
GLOBULIN: 1.8 g/dL — AB (ref 1.9–3.7)
Glucose, Bld: 119 mg/dL — ABNORMAL HIGH (ref 65–99)
Potassium: 6 mmol/L — ABNORMAL HIGH (ref 3.5–5.3)
SODIUM: 144 mmol/L (ref 135–146)
Total Bilirubin: 0.6 mg/dL (ref 0.2–1.2)
Total Protein: 5.3 g/dL — ABNORMAL LOW (ref 6.1–8.1)

## 2018-01-05 LAB — CBC WITH DIFFERENTIAL/PLATELET
Basophils Absolute: 56 cells/uL (ref 0–200)
Basophils Relative: 0.4 %
EOS ABS: 14 {cells}/uL — AB (ref 15–500)
Eosinophils Relative: 0.1 %
HEMATOCRIT: 43.8 % (ref 38.5–50.0)
HEMOGLOBIN: 15.1 g/dL (ref 13.2–17.1)
LYMPHS ABS: 832 {cells}/uL — AB (ref 850–3900)
MCH: 33.5 pg — ABNORMAL HIGH (ref 27.0–33.0)
MCHC: 34.5 g/dL (ref 32.0–36.0)
MCV: 97.1 fL (ref 80.0–100.0)
MPV: 10.5 fL (ref 7.5–12.5)
Monocytes Relative: 4.6 %
NEUTROS PCT: 89 %
Neutro Abs: 12549 cells/uL — ABNORMAL HIGH (ref 1500–7800)
Platelets: 217 10*3/uL (ref 140–400)
RBC: 4.51 10*6/uL (ref 4.20–5.80)
RDW: 12.9 % (ref 11.0–15.0)
TOTAL LYMPHOCYTE: 5.9 %
WBC: 14.1 10*3/uL — ABNORMAL HIGH (ref 3.8–10.8)
WBCMIX: 649 {cells}/uL (ref 200–950)

## 2018-01-05 NOTE — Progress Notes (Signed)
Subjective:    Patient ID: Bruce Travis, male    DOB: Aug 12, 1935, 83 y.o.   MRN: [id number]  HPI  12/26/17 Patient reports progressively worsening shortness of breath.  He states that while he sitting still he is completely fine however soon as he stands up, he quickly becomes winded and is very short of breath.  He denies any chest pain.  He denies any orthopnea.  His weight is stable.  There is no pitting edema there is no evidence of fluid overload on exam.  His cough is productive of gray sputum however this is his baseline.  He states that his cough is almost constant.  He believes allergies and pollen are exacerbating his emphysema.  He has been coughing now for more than a month however over the last 10 days it is gotten to the point where he is having significant dyspnea on exertion.  He is 89% on 3 L with ambulation.  He is 90% on 3 L at rest.  He denies any hemoptysis or pleurisy.  There is no pitting edema in his extremities.  There is no evidence of a DVT or PE.  At that time, my plan was: I believe this is most likely emphysema with an underlying asthma component exacerbated by seasonal allergies.  Patient is having postnasal drip and constant cough and is now wheezing and short of breath.  I do not believe this is infectious and therefore I do not believe that antibiotics will be beneficial.  I will give the patient 80 mg of Depo-Medrol IM and then start him on 60 mg a day Prednisol beginning tomorrow and reassess the patient in 48 hours.  If his breathing worsens go straight to the hospital.  There is no evidence of CHF on exam.  I will send the patient for a chest x-ray and if there is any evidence of an infiltrate, I will start the patient on broad-spectrum antibiotics however at this point, I believe this is more likely bronchospasm due to seasonal allergies/asthma exacerbating his emphysema  12/29/17 IMPRESSION: 1. Prominent markings at the left lung base may represent focus of pneumonia.  Recommend continued follow-up. 2. Hyperaeration suggests emphysematous change  Patient is currently on prednisone 60 mg a day.  Due to his amiodarone and the risk of amiodarone being combined with a fluoroquinolone, the patient is on amoxicillin 1 g p.o. 3 times daily to cover Streptococcus pneumonia as well as doxycycline 100 mg p.o. twice daily to cover atypical infection such as mycoplasma or chlamydia.  He states that he is not doing much better.  He is 88% on 3 L walking in.  After sitting, he is at 90%.  When I asked how often he is doing his rescue medicine, he states that he does not have any nebulizers at home.  He has been doing [employer] once or twice a day if necessary for when he really feel short of breath.  This is much less than what I had expected the patient to be doing which is every 4-6 hours.  Patient is given a DuoNeb immediately today in clinic at 1130.  At that time, my plan was: Patient received 1 DuoNeb in clinic.  We discussed his options.  I recommended duo nebs every 6 hours scheduled with continued use of prednisone 60 mg a day, amoxicillin 1 g 3 times daily, and doxycycline 100 mg p.o. twice daily.  However the patient states that minimal ambulation causes him to feel extremely  short of breath despite being on up to 5 L of oxygen via nasal cannula.  He is beginning to feel weak and tired.  We discussed hospitalization for IV antibiotics IV steroids and medical surveillance.  Patient request to go to the hospital.  I explained to him that he can certainly go to the hospital given his history as he is failing outpatient therapy.  Patient is going to [facility] emergency room via private transportation.  I have copied relevant portions of the discharge summary below for my reference:  Admit date: 12/29/2017 Discharge date: 12/31/2017  Brief/Interim Summary:  Patient is a 83 year old male with past medical history of chronic respiratory failure on home oxygen3liters at rest,  chronic diastolic CHF, CKD stage III, GERD, COPD who presented with progressive shortness of breath with chronic cough.Chest x-ray done as an outpatient showed left lower lobe pneumonia. Patient was started on steroids, antibiotic by his PCP but he continued to get worse so presented to the emergency department.Patient was admitted for the management of pneumonia and COPD exacerbation. Patient was started on broad-spectrum antibiotics on presentation.  His respiratory status gradually improved. Patient is on 3 L of oxygen via nasal cannula at home and currently he is on baseline.  This morning he feels comfortable.  Coughing has improved.  He is afebrile.  Cultures are negative so far. He is stable to be discharged to home with oral antibiotics and steroids. We recommend him to follow-up with his pulmonologist as an outpatient.  Following problems were addressed during his hospitalization:  Acute on chronic respiratory failure with hypoxia: Secondary to COPD and pneumonia. Continue supplemental oxygen at home, bronchodilators, steroids, antibiotics.  Community-acquired pneumonia:Chest x-ray showed bilateral pneumonia.Patient failed outpatient oral antibiotic. Started on azithromycin and ceftriaxone here.  Antibiotics changed to oral on discharge Negative  blood cultures, sputum cultures so far.Urine Streptococcalantigen negative.  COPD: Follows with pulmonology as an outpatient. On nebulization machine, bronchodilators at home.On 3 L oxygen at rest.  GERD: Continue Protonix  Hypertension: Currently blood pressure stable.Continue home meds.  Coronary artery disease with ischemic cardiomyopathy: Currently stable. No chest pain. Continue aspirin, Entresto, amiodarone  Chronic kidney disease stage III: Currently creatinine on baseline  History of DHR:CBULAGTX Flomax  Lactic acidosis: Improved.  Diastolic CHF: 2D echo on 64/68 showed EF 55 to 60%.Currently he is  euvolemic. No peripheral edema. Continue Entresto. Spironolactone on hold. Not on beta-blocker therapy due to severe COPD.  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 Follows with cardiology  01/05/18 Here for follow up.     Discharge Diagnoses:  Principal Problem:   Acute respiratory failure with hypoxia ([place]) Active Problems:   GERD   COPD exacerbation (HCC)   Hypertension   Community acquired pneumonia of left lower lobe of lung ([place])   CAD (coronary artery disease)   Chronic kidney disease (CKD), stage III (moderate) ([place])  01/05/18 Patient states that he feels only slightly better from when he went to the hospital.  He states he may be 10 to 15% better.  He is completed 7 days of prednisone 40 mg a day.  According to his hospital instructions, he will discontinue prednisone altogether today and stop it cold [place].  In the past with the patient this is quickly led to a rebound exacerbation and therefore he is extremely frightened to do this.  He thinks he may have 1-2 more days of antibiotics to take.  He denies any fever.  His biggest issue is chest congestion.  He states that whenever he  lies down at night, he starts coughing up thick wads of mucus and he is unable to breathe.  During the day he feels okay.  However when he is supine, his shortness of breath worsens.  He denies any peripheral edema.  He denies any chest pain.  He does have faint bibasilar crackles but there is no JVD or signs of fluid overload to explain orthopnea.  He denies any fever or hemoptysis.  He is taking amiodarone 100 mg every day and has been doing this for several years.  In the past possible amiodarone toxicity as a potential cause for his shortness of breath.  I would like to keep this in the back of her mons at the present time as the patient has had roughly 8 weeks of almost continuous shortness of breath that is not responding like usual to prednisone and antibiotics. Past Medical History:    Diagnosis Date  . Allergic rhinitis   . Anxiety   . Bronchiectasis   . CAD (coronary artery disease)   . Celiac disease   . Chronic heart failure ([place])   . Chronic kidney disease (CKD), stage III (moderate) (HCC)   . COPD (chronic obstructive pulmonary disease) ([place])   . Diverticulosis of colon (without mention of hemorrhage)   . Esophageal reflux   . Family history of adverse reaction to anesthesia    daughter gets PONV  . Family hx of colon cancer   . History of stomach ulcers 1980s  . Hyperlipemia   . Hypertension   . 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 Myocardial infarction [facility])    "previous MI/echo in 12/2015"  . Nephrolithiasis    "got them now; never had OR/scopes" (02/03/2016)  . On home oxygen therapy    "3L-5L; 24/7" (12/29/2017)  . Other diseases of lung, not elsewhere classified   . Personal history of colonic polyps 04/26/2007   hyperplastic   . Pneumonia "several times"   Past Surgical History:  Procedure Laterality Date  . CARDIAC CATHETERIZATION  02/03/2016  . CARDIAC CATHETERIZATION  09/30/2009   non-obstructive CAD w/30% narrowing in prox LAD (Dr. Waunita Schooner)  . CARDIAC CATHETERIZATION  05/04/2001   same at 2011 cath (Dr. Waunita Schooner)  . CARDIAC CATHETERIZATION N/A 02/03/2016   Procedure: Right/Left Heart Cath and Coronary Angiography;  Surgeon: Pixie Casino, MD;  Location: [place] [facility];  Service: Cardiovascular;  Laterality: N/A;  . EPIGLOTOPLASTY W/ MLB     removal due to carcinoma  . EXCISIONAL HEMORRHOIDECTOMY  2000s  . HAND SURGERY Right 1958   d/t crush injury  . TONSILLECTOMY AND ADENOIDECTOMY    . ULTRASOUND GUIDANCE FOR VASCULAR ACCESS  02/03/2016   Procedure: Ultrasound Guidance For Vascular Access;  Surgeon: Pixie Casino, MD;  Location: [place] [facility];  Service: Cardiovascular;;  . VASECTOMY     Current Outpatient Medications on File Prior to Visit  Medication Sig Dispense Refill  . albuterol (PROAIR  HFA) 108 (90 Base) MCG/ACT inhaler INHALE 2 PUFFS EVERY 4 HOURS AS NEEDED FOR SHORTNESS OF BREATH. 2 Inhaler 5  . albuterol (PROVENTIL) (2.5 MG/3ML) 0.083% nebulizer solution Take 3 mLs (2.5 mg total) by nebulization every 4 (four) hours as needed for wheezing or shortness of breath. 75 mL 5  . ALPRAZolam (XANAX) 0.25 MG tablet Take 1 tablet (0.25 mg total) by mouth 2 (two) times daily as needed for anxiety. 20 tablet 0  . amiodarone (PACERONE) 200 MG tablet  Take 0.5 tablets (100 mg total) by mouth daily. 15 tablet 11  . aspirin EC 81 MG tablet Take 81 mg by mouth daily.    Marland Kitchen azelastine (ASTELIN) 0.1 % nasal spray PLACE 2 SPRAYS INTO EACH NOSTRILS TWICE DAILY AS DIRECTED. 30 mL 5  . Calcium Carbonate-Vitamin D (CALTRATE 600+D) 600-400 MG-UNIT tablet Take 1 tablet by mouth daily.     . cefdinir (OMNICEF) 300 MG capsule Take 1 capsule (300 mg total) by mouth 2 (two) times daily for 5 days. 10 capsule 0  . dextromethorphan-guaiFENesin (MUCINEX DM) 30-600 MG 12hr tablet Take 1 tablet by mouth 2 (two) times daily.    Marland Kitchen ENTRESTO 49-51 MG TAKE (1) TABLET BY MOUTH TWICE DAILY. 60 tablet 11  . HYDROcodone-homatropine (HYCODAN) 5-1.5 MG/5ML syrup Take 2.5 - 5 mLs PO Q 8 hours PRN for severe coughing. DO NOT TAKE at the same time as XANAX. 120 mL 0  . LUTEIN PO Take 1 capsule by mouth daily.    . montelukast (SINGULAIR) 10 MG tablet TAKE 1 TABLET BY MOUTH DAILY. (Patient taking differently: TAKE 1 TABLET (10 MG TOTALLY) BY MOUTH DAILY.) 90 tablet 0  . Multiple Vitamins-Minerals (CENTRUM SILVER 50+MEN) TABS Take 1 tablet by mouth daily.    . OXYGEN Inhale 3 L into the lungs continuous. 3lpm with rest and 6 lpm with exertion    . pantoprazole (PROTONIX) 40 MG tablet TAKE ONE TABLET BY MOUTH DAILY. (Patient taking differently: TAKE ONE TABLET (40 MG TOTALLY) BY MOUTH DAILY.) 90 tablet 3  . polyethylene glycol (MIRALAX / GLYCOLAX) packet Take 17 g by mouth daily as needed for mild constipation. Mix in 8 oz liquid  and drink    . predniSONE (DELTASONE) 20 MG tablet Take 2 tablets (40 mg total) by mouth daily with breakfast for 4 days. 8 tablet 0  . spironolactone (ALDACTONE) 25 MG tablet TAKE (1/2) TABLET BY MOUTH DAILY. (Patient taking differently: TAKE (1/2) TABLET (12.5 MG TOTALLY) BY MOUTH DAILY.) 16 tablet 3  . SYMBICORT 160-4.5 MCG/ACT inhaler INHALE 2 PUFFS FIRST THING IN MORNING AND THEN ANOTHER 2 PUFFS ABOUT 12 HOURS LATER. 10.2 g 11  . tamsulosin (FLOMAX) 0.4 MG CAPS capsule Take 1 capsule (0.4 mg total) by mouth daily. 30 capsule 3  . Tiotropium Bromide Monohydrate (SPIRIVA RESPIMAT) 2.5 MCG/ACT AERS Inhale 2 puffs into the lungs daily. 1 Inhaler 11   No current facility-administered medications on file prior to visit.    Allergies  Allergen Reactions  . Codeine Other (See Comments)    Thought head was going to blow off/ severe headache  . Adhesive [Tape] Other (See Comments)    Band-aids - tears skin off  . Gluten Meal Other (See Comments)    Celiac disease   Social History   Socioeconomic History  . Marital status: Married    Spouse name: Not on file  . Number of children: 3  . Years of education: Not on file  . Highest education level: Not on file  Occupational History  . 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 Financial resource strain: Not on file  . Food insecurity:    Worry: Not on file    Inability: Not on file  . Transportation needs:    Medical: Not on file    Non-medical: Not on file  Tobacco Use  . Smoking status: Former Smoker    Packs/day: 2.00    Years:  35.00    Pack years: 70.00    Types: Cigarettes    Last attempt to quit: 01/21/1989    Years since quitting: 28.9  . Smokeless tobacco: Former Systems developer    Types: [place] date: 08/23/2001  Substance and Sexual Activity  . Alcohol use: No  . Drug use: No  . Sexual activity: Not on file  Lifestyle  . Physical activity:    Days per week: Not on file     Minutes per session: Not on file  . Stress: Not on file  Relationships  . Social connections:    Talks on phone: Not on file    Gets together: Not on file    Attends religious service: Not on file    Active member of club or organization: Not on file    Attends meetings of clubs or organizations: Not on file    Relationship status: Not on file  . Intimate partner violence:    Fear of current or ex partner: Not on file    Emotionally abused: Not on file    Physically abused: Not on file    Forced sexual activity: Not on file  Other Topics Concern  . Not on file  Social History Narrative  . Not on file      Review of Systems  All other systems reviewed and are negative.      Objective:   Physical Exam  HENT:  Right Ear: Tympanic membrane and ear canal normal.  Left Ear: Tympanic membrane and ear canal normal.  Nose: No mucosal edema or rhinorrhea.  Cardiovascular: Normal rate, regular rhythm and normal heart sounds.  No murmur heard. Pulmonary/Chest: He has decreased breath sounds. He has wheezes. He has no rhonchi. He has rales. He exhibits no tenderness.  Vitals reviewed. Patient has faint bibasilar crackles       Assessment & Plan:  Dyspnea, unspecified type - Plan: DG Chest 2 View, Brain natriuretic peptide, CBC with Differential/Platelet, COMPLETE METABOLIC PANEL WITH GFR  COPD with acute exacerbation ([place])  Community acquired pneumonia of left lower lobe of lung [facility])  Hospital discharge follow-up    I would like to wean the patient off prednisone slowly.  Decrease to 20 mg a day for 1 week and then recheck here next week.  If he is doing better at that time, I will decrease him to 10 mg a day and we may keep him on 10 mg a day more long-term given his recent exacerbations.  I would like to repeat a chest x-ray given his orthopnea to rule out pulmonary edema as a possible contributing factor to his shortness of breath after his hospitalization.  I will check  a BNP to evaluate for evidence of fluid overload although clinically my impression is that he is euvolemic.  I would also like to schedule the patient to follow-up with his pulmonologist to see if his pulmonologist feels if any of this may be related to his amiodarone use.  Meanwhile continue the combination of Symbicort and Spiriva along with his oxygen at 3 L via nasal cannula.  He is also using albuterol 2.5 mg nebulizer treatments every 6 hours.

## 2018-01-06 LAB — BRAIN NATRIURETIC PEPTIDE: Brain Natriuretic Peptide: 67 pg/mL (ref <100)

## 2018-01-09 ENCOUNTER — Other Ambulatory Visit: Payer: Medicare Other

## 2018-01-09 DIAGNOSIS — R0600 Dyspnea, unspecified: Secondary | ICD-10-CM

## 2018-01-10 ENCOUNTER — Encounter (HOSPITAL_COMMUNITY): Payer: Self-pay

## 2018-01-10 LAB — BRAIN NATRIURETIC PEPTIDE: BRAIN NATRIURETIC PEPTIDE: 47 pg/mL (ref <100)

## 2018-01-12 ENCOUNTER — Telehealth: Payer: Self-pay | Admitting: Family Medicine

## 2018-01-12 ENCOUNTER — Encounter: Payer: Self-pay | Admitting: Family Medicine

## 2018-01-12 ENCOUNTER — Ambulatory Visit: Payer: Medicare Other | Admitting: Family Medicine

## 2018-01-12 ENCOUNTER — Other Ambulatory Visit: Payer: Self-pay

## 2018-01-12 ENCOUNTER — Encounter (HOSPITAL_COMMUNITY): Payer: Self-pay

## 2018-01-12 VITALS — BP 140/82 | HR 82 | Temp 97.9°F | Resp 18 | Ht 66.5 in | Wt 127.0 lb

## 2018-01-12 DIAGNOSIS — R0600 Dyspnea, unspecified: Secondary | ICD-10-CM

## 2018-01-12 DIAGNOSIS — J441 Chronic obstructive pulmonary disease with (acute) exacerbation: Secondary | ICD-10-CM

## 2018-01-12 DIAGNOSIS — E875 Hyperkalemia: Secondary | ICD-10-CM | POA: Diagnosis not present

## 2018-01-12 LAB — BASIC METABOLIC PANEL WITH GFR
BUN: 25 mg/dL (ref 7–25)
CHLORIDE: 108 mmol/L (ref 98–110)
CO2: 27 mmol/L (ref 20–32)
Calcium: 9.1 mg/dL (ref 8.6–10.3)
Creat: 1.04 mg/dL (ref 0.70–1.11)
GFR, Est African American: 78 mL/min/{1.73_m2} (ref >=60)
GFR, Est Non African American: 67 mL/min/{1.73_m2} (ref >=60)
GLUCOSE: 93 mg/dL (ref 65–99)
POTASSIUM: 4.7 mmol/L (ref 3.5–5.3)
SODIUM: 141 mmol/L (ref 135–146)

## 2018-01-12 MED ORDER — ALBUTEROL SULFATE (2.5 MG/3ML) 0.083% IN NEBU: 2.5000 mg | INHALATION_SOLUTION | RESPIRATORY_TRACT | 5 refills | Status: DC | PRN

## 2018-01-12 NOTE — Telephone Encounter (Signed)
Patient forgot to tell you that he needed albuterol for his nebulizer  Can we send this to [employer]

## 2018-01-12 NOTE — Progress Notes (Signed)
Subjective:    Patient ID: Bruce Travis, male    DOB: [birth year], 83 y.o.   MRN: [id number]  HPI  12/26/17 Patient reports progressively worsening shortness of breath.  He states that while he sitting still he is completely fine however soon as he stands up, he quickly becomes winded and is very short of breath.  He denies any chest pain.  He denies any orthopnea.  His weight is stable.  There is no pitting edema there is no evidence of fluid overload on exam.  His cough is productive of gray sputum however this is his baseline.  He states that his cough is almost constant.  He believes allergies and pollen are exacerbating his emphysema.  He has been coughing now for more than a month however over the last 10 days it is gotten to the point where he is having significant dyspnea on exertion.  He is 89% on 3 L with ambulation.  He is 90% on 3 L at rest.  He denies any hemoptysis or pleurisy.  There is no pitting edema in his extremities.  There is no evidence of a DVT or PE.  At that time, my plan was: I believe this is most likely emphysema with an underlying asthma component exacerbated by seasonal allergies.  Patient is having postnasal drip and constant cough and is now wheezing and short of breath.  I do not believe this is infectious and therefore I do not believe that antibiotics will be beneficial.  I will give the patient 80 mg of Depo-Medrol IM and then start him on 60 mg a day Prednisol beginning tomorrow and reassess the patient in 48 hours.  If his breathing worsens go straight to the hospital.  There is no evidence of CHF on exam.  I will send the patient for a chest x-ray and if there is any evidence of an infiltrate, I will start the patient on broad-spectrum antibiotics however at this point, I believe this is more likely bronchospasm due to seasonal allergies/asthma exacerbating his emphysema  12/29/17 IMPRESSION: 1. Prominent markings at the left lung base may represent focus of pneumonia.  Recommend continued follow-up. 2. Hyperaeration suggests emphysematous change  Patient is currently on prednisone 60 mg a day.  Due to his amiodarone and the risk of amiodarone being combined with a fluoroquinolone, the patient is on amoxicillin 1 g p.o. 3 times daily to cover Streptococcus pneumonia as well as doxycycline 100 mg p.o. twice daily to cover atypical infection such as mycoplasma or chlamydia.  He states that he is not doing much better.  He is 88% on 3 L walking in.  After sitting, he is at 90%.  When I asked how often he is doing his rescue medicine, he states that he does not have any nebulizers at home.  He has been doing [employer] once or twice a day if necessary for when he really feel short of breath.  This is much less than what I had expected the patient to be doing which is every 4-6 hours.  Patient is given a DuoNeb immediately today in clinic at 1130.  At that time, my plan was: Patient received 1 DuoNeb in clinic.  We discussed his options.  I recommended duo nebs every 6 hours scheduled with continued use of prednisone 60 mg a day, amoxicillin 1 g 3 times daily, and doxycycline 100 mg p.o. twice daily.  However the patient states that minimal ambulation causes him to feel extremely  short of breath despite being on up to 5 L of oxygen via nasal cannula.  He is beginning to feel weak and tired.  We discussed hospitalization for IV antibiotics IV steroids and medical surveillance.  Patient request to go to the hospital.  I explained to him that he can certainly go to the hospital given his history as he is failing outpatient therapy.  Patient is going to [facility] emergency room via private transportation.  I have copied relevant portions of the discharge summary below for my reference:  Admit date: 12/29/2017 Discharge date: 12/31/2017  Brief/Interim Summary:  Patient is a 83 year old male with past medical history of chronic respiratory failure on home oxygen3liters at rest,  chronic diastolic CHF, CKD stage III, GERD, COPD who presented with progressive shortness of breath with chronic cough.Chest x-ray done as an outpatient showed left lower lobe pneumonia. Patient was started on steroids, antibiotic by his PCP but he continued to get worse so presented to the emergency department.Patient was admitted for the management of pneumonia and COPD exacerbation. Patient was started on broad-spectrum antibiotics on presentation.  His respiratory status gradually improved. Patient is on 3 L of oxygen via nasal cannula at home and currently he is on baseline.  This morning he feels comfortable.  Coughing has improved.  He is afebrile.  Cultures are negative so far. He is stable to be discharged to home with oral antibiotics and steroids. We recommend him to follow-up with his pulmonologist as an outpatient.  Following problems were addressed during his hospitalization:  Acute on chronic respiratory failure with hypoxia: Secondary to COPD and pneumonia. Continue supplemental oxygen at home, bronchodilators, steroids, antibiotics.  Community-acquired pneumonia:Chest x-ray showed bilateral pneumonia.Patient failed outpatient oral antibiotic. Started on azithromycin and ceftriaxone here.  Antibiotics changed to oral on discharge Negative  blood cultures, sputum cultures so far.Urine Streptococcalantigen negative.  COPD: Follows with pulmonology as an outpatient. On nebulization machine, bronchodilators at home.On 3 L oxygen at rest.  GERD: Continue Protonix  Hypertension: Currently blood pressure stable.Continue home meds.  Coronary artery disease with ischemic cardiomyopathy: Currently stable. No chest pain. Continue aspirin, Entresto, amiodarone  Chronic kidney disease stage III: Currently creatinine on baseline  History of OAC:[id number] Flomax  Lactic acidosis: Improved.  Diastolic CHF: 2D echo on 60/10 showed EF 55 to 60%.Currently he is  euvolemic. No peripheral edema. Continue Entresto. Spironolactone on hold. Not on beta-blocker therapy due to severe COPD.  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 Follows with cardiology  01/05/18 Here for follow up.     Discharge Diagnoses:  Principal Problem:   Acute respiratory failure with hypoxia ([place]) Active Problems:   GERD   COPD exacerbation (HCC)   Hypertension   Community acquired pneumonia of left lower lobe of lung ([place])   CAD (coronary artery disease)   Chronic kidney disease (CKD), stage III (moderate) ([place])  01/05/18 Patient states that he feels only slightly better from when he went to the hospital.  He states he may be 10 to 15% better.  He is completed 7 days of prednisone 40 mg a day.  According to his hospital instructions, he will discontinue prednisone altogether today and stop it cold [place].  In the past with the patient this is quickly led to a rebound exacerbation and therefore he is extremely frightened to do this.  He thinks he may have 1-2 more days of antibiotics to take.  He denies any fever.  His biggest issue is chest congestion.  He states that whenever he  lies down at night, he starts coughing up thick wads of mucus and he is unable to breathe.  During the day he feels okay.  However when he is supine, his shortness of breath worsens.  He denies any peripheral edema.  He denies any chest pain.  He does have faint bibasilar crackles but there is no JVD or signs of fluid overload to explain orthopnea.  He denies any fever or hemoptysis.  He is taking amiodarone 100 mg every day and has been doing this for several years.  In the past possible amiodarone toxicity as a potential cause for his shortness of breath.  I would like to keep this in the back of her mons at the present time as the patient has had roughly 8 weeks of almost continuous shortness of breath that is not responding like usual to prednisone and antibiotics.  At that time, my plan  was:  I would like to wean the patient off prednisone slowly.  Decrease to 20 mg a day for 1 week and then recheck here next week.  If he is doing better at that time, I will decrease him to 10 mg a day and we may keep him on 10 mg a day more long-term given his recent exacerbations.  I would like to repeat a chest x-ray given his orthopnea to rule out pulmonary edema as a possible contributing factor to his shortness of breath after his hospitalization.  I will check a BNP to evaluate for evidence of fluid overload although clinically my impression is that he is euvolemic.  I would also like to schedule the patient to follow-up with his pulmonologist to see if his pulmonologist feels if any of this may be related to his amiodarone use.  Meanwhile continue the combination of Symbicort and Spiriva along with his oxygen at 3 L via nasal cannula.  He is also using albuterol 2.5 mg nebulizer treatments every 6 hours.  01/12/18 Patient states that he feels no better.  He continues to have significant dyspnea with minimal exertion.  He is afebrile.  His repeat chest x-ray showed improvement in his pneumonia and almost resolution.  His BNP has been normal on 2 separate occasions.  Patient states that whenever he lies down at night, he begins to cough up green mucus.  Therefore he has to sleep sitting in a chair which keeps him from resting.  He is reporting subjective dyspnea.  He denies any chest pain.  There is no peripheral edema.  He denies hemoptysis.  He denies pleurisy.  Patient has not been checked for a PE.  He is currently taking 20 mg of prednisone a day.  He is also compliant with his Symbicort and Spiriva.  In addition he is using DuoNeb's 2 and 3 times a day for rescue.  He has completed his antibiotics.  Recently I held his spironolactone due to elevated potassium Past Medical History:  Diagnosis Date  . Allergic rhinitis   . Anxiety   . Bronchiectasis   . CAD (coronary artery disease)   . Celiac  disease   . Chronic heart failure ([place])   . Chronic kidney disease (CKD), stage III (moderate) (HCC)   . COPD (chronic obstructive pulmonary disease) ([place])   . Diverticulosis of colon (without mention of hemorrhage)   . Esophageal reflux   . Family history of adverse reaction to anesthesia    daughter gets PONV  . Family hx of colon cancer   . History of stomach  ulcers 1980s  . Hyperlipemia   . Hypertension   . 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 Myocardial infarction [facility])    "previous MI/echo in 12/2015"  . Nephrolithiasis    "got them now; never had OR/scopes" (02/03/2016)  . On home oxygen therapy    "3L-5L; 24/7" (12/29/2017)  . Other diseases of lung, not elsewhere classified   . Personal history of colonic polyps 04/26/2007   hyperplastic   . Pneumonia "several times"   Past Surgical History:  Procedure Laterality Date  . CARDIAC CATHETERIZATION  02/03/2016  . CARDIAC CATHETERIZATION  09/30/2009   non-obstructive CAD w/30% narrowing in prox LAD (Dr. Waunita Schooner)  . CARDIAC CATHETERIZATION  05/04/2001   same at 2011 cath (Dr. Waunita Schooner)  . CARDIAC CATHETERIZATION N/A 02/03/2016   Procedure: Right/Left Heart Cath and Coronary Angiography;  Surgeon: Pixie Casino, MD;  Location: [place] [facility];  Service: Cardiovascular;  Laterality: N/A;  . EPIGLOTOPLASTY W/ MLB     removal due to carcinoma  . EXCISIONAL HEMORRHOIDECTOMY  2000s  . HAND SURGERY Right 1958   d/t crush injury  . TONSILLECTOMY AND ADENOIDECTOMY    . ULTRASOUND GUIDANCE FOR VASCULAR ACCESS  02/03/2016   Procedure: Ultrasound Guidance For Vascular Access;  Surgeon: Pixie Casino, MD;  Location: [place] [facility];  Service: Cardiovascular;;  . VASECTOMY     Current Outpatient Medications on File Prior to Visit  Medication Sig Dispense Refill  . albuterol (PROAIR HFA) 108 (90 Base) MCG/ACT inhaler INHALE 2 PUFFS EVERY 4 HOURS AS NEEDED FOR SHORTNESS OF BREATH. 2 Inhaler 5  .  albuterol (PROVENTIL) (2.5 MG/3ML) 0.083% nebulizer solution Take 3 mLs (2.5 mg total) by nebulization every 4 (four) hours as needed for wheezing or shortness of breath. 75 mL 5  . ALPRAZolam (XANAX) 0.25 MG tablet Take 1 tablet (0.25 mg total) by mouth 2 (two) times daily as needed for anxiety. 20 tablet 0  . amiodarone (PACERONE) 200 MG tablet Take 0.5 tablets (100 mg total) by mouth daily. 15 tablet 11  . aspirin EC 81 MG tablet Take 81 mg by mouth daily.    Marland Kitchen azelastine (ASTELIN) 0.1 % nasal spray PLACE 2 SPRAYS INTO EACH NOSTRILS TWICE DAILY AS DIRECTED. 30 mL 5  . Calcium Carbonate-Vitamin D (CALTRATE 600+D) 600-400 MG-UNIT tablet Take 1 tablet by mouth daily.     Marland Kitchen dextromethorphan-guaiFENesin (MUCINEX DM) 30-600 MG 12hr tablet Take 1 tablet by mouth 2 (two) times daily.    Marland Kitchen ENTRESTO 49-51 MG TAKE (1) TABLET BY MOUTH TWICE DAILY. 60 tablet 11  . HYDROcodone-homatropine (HYCODAN) 5-1.5 MG/5ML syrup Take 2.5 - 5 mLs PO Q 8 hours PRN for severe coughing. DO NOT TAKE at the same time as XANAX. 120 mL 0  . LUTEIN PO Take 1 capsule by mouth daily.    . montelukast (SINGULAIR) 10 MG tablet TAKE 1 TABLET BY MOUTH DAILY. (Patient taking differently: TAKE 1 TABLET (10 MG TOTALLY) BY MOUTH DAILY.) 90 tablet 0  . Multiple Vitamins-Minerals (CENTRUM SILVER 50+MEN) TABS Take 1 tablet by mouth daily.    . OXYGEN Inhale 3 L into the lungs continuous. 3lpm with rest and 6 lpm with exertion    . pantoprazole (PROTONIX) 40 MG tablet TAKE ONE TABLET BY MOUTH DAILY. (Patient taking differently: TAKE ONE TABLET (40 MG TOTALLY) BY MOUTH DAILY.) 90 tablet 3  . polyethylene glycol (MIRALAX / GLYCOLAX) packet Take 17 g by mouth daily  as needed for mild constipation. Mix in 8 oz liquid and drink    . SYMBICORT 160-4.5 MCG/ACT inhaler INHALE 2 PUFFS FIRST THING IN MORNING AND THEN ANOTHER 2 PUFFS ABOUT 12 HOURS LATER. 10.2 g 11  . tamsulosin (FLOMAX) 0.4 MG CAPS capsule Take 1 capsule (0.4 mg total) by mouth daily. 30  capsule 3  . Tiotropium Bromide Monohydrate (SPIRIVA RESPIMAT) 2.5 MCG/ACT AERS Inhale 2 puffs into the lungs daily. 1 Inhaler 11  . spironolactone (ALDACTONE) 25 MG tablet TAKE (1/2) TABLET BY MOUTH DAILY. (Patient not taking: Reported on 01/12/2018) 16 tablet 3   No current facility-administered medications on file prior to visit.    Allergies  Allergen Reactions  . Codeine Other (See Comments)    Thought head was going to blow off/ severe headache  . Adhesive [Tape] Other (See Comments)    Band-aids - tears skin off  . Gluten Meal Other (See Comments)    Celiac disease   Social History   Socioeconomic History  . Marital status: Married    Spouse name: Not on file  . Number of children: 3  . Years of education: Not on file  . Highest education level: Not on file  Occupational History  . 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 Financial resource strain: Not on file  . Food insecurity:    Worry: Not on file    Inability: Not on file  . Transportation needs:    Medical: Not on file    Non-medical: Not on file  Tobacco Use  . Smoking status: Former Smoker    Packs/day: 2.00    Years: 35.00    Pack years: 70.00    Types: Cigarettes    Last attempt to quit: 01/21/1989    Years since quitting: 28.9  . Smokeless tobacco: Former Systems developer    Types: [place] date: 08/23/2001  Substance and Sexual Activity  . Alcohol use: No  . Drug use: No  . Sexual activity: Not on file  Lifestyle  . Physical activity:    Days per week: Not on file    Minutes per session: Not on file  . Stress: Not on file  Relationships  . Social connections:    Talks on phone: Not on file    Gets together: Not on file    Attends religious service: Not on file    Active member of club or organization: Not on file    Attends meetings of clubs or organizations: Not on file    Relationship status: Not on file  . Intimate partner violence:    Fear  of current or ex partner: Not on file    Emotionally abused: Not on file    Physically abused: Not on file    Forced sexual activity: Not on file  Other Topics Concern  . Not on file  Social History Narrative  . Not on file      Review of Systems  All other systems reviewed and are negative.      Objective:   Physical Exam  HENT:  Right Ear: Tympanic membrane and ear canal normal.  Left Ear: Tympanic membrane and ear canal normal.  Nose: No mucosal edema or rhinorrhea.  Cardiovascular: Normal rate, regular rhythm and normal heart sounds.  No murmur heard. Pulmonary/Chest: He has decreased breath sounds. He has wheezes. He has no rhonchi. He has rales. He exhibits no  tenderness.  Vitals reviewed. Patient has faint bibasilar crackles       Assessment & Plan:  Hyperkalemia - Plan: BASIC METABOLIC PANEL WITH GFR  Dyspnea, unspecified type  Repeat BMP today.  If potassium is normal, I will resume his Spironolactone.  Regarding his dyspnea, unfortunately, I do not know what else to do.  We could try stopping his amiodarone however his dyspnea seemed to occur suddenly when he developed pneumonia.  I believe that his recent pneumonia has decreased his baseline respiratory status which was severe end-stage oxygen dependent COPD at best.  Therefore this may be the new norm.  It may also take weeks or even months for the patient to gradually and slowly improve.  I would keep pulmonary embolism in the back of my mind however his history does not sound consistent with that and there is no pitting edema on exam.  Patient has an appointment to see his pulmonologist later this month for a second opinion however at the present time I will continue to slowly wean him off prednisone and decrease the dose to 10 mg a day.

## 2018-01-12 NOTE — Telephone Encounter (Signed)
Prescription sent to pharmacy.

## 2018-01-13 ENCOUNTER — Other Ambulatory Visit: Payer: Self-pay

## 2018-01-13 ENCOUNTER — Telehealth: Payer: Self-pay | Admitting: Family Medicine

## 2018-01-13 DIAGNOSIS — E875 Hyperkalemia: Secondary | ICD-10-CM

## 2018-01-13 NOTE — Telephone Encounter (Signed)
Call placed to patient he is aware of his lab results

## 2018-01-13 NOTE — Telephone Encounter (Signed)
Patient calling about lab results that were done yesterday  Would like a call back today if possible  [phone]

## 2018-01-17 ENCOUNTER — Encounter (HOSPITAL_COMMUNITY): Payer: Self-pay

## 2018-01-19 ENCOUNTER — Encounter (HOSPITAL_COMMUNITY): Payer: Self-pay

## 2018-01-20 ENCOUNTER — Other Ambulatory Visit: Payer: Medicare Other

## 2018-01-20 ENCOUNTER — Ambulatory Visit: Payer: Medicare Other | Admitting: Adult Health

## 2018-01-20 ENCOUNTER — Ambulatory Visit (INDEPENDENT_AMBULATORY_CARE_PROVIDER_SITE_OTHER)
Admission: RE | Admit: 2018-01-20 | Discharge: 2018-01-20 | Disposition: A | Discharge status: Home / Self Care | Payer: Medicare Other | Source: Ambulatory Visit | Attending: Adult Health | Admitting: Adult Health

## 2018-01-20 ENCOUNTER — Encounter: Payer: Self-pay | Admitting: Adult Health

## 2018-01-20 VITALS — BP 122/58 | HR 72 | Ht 66.5 in | Wt 125.0 lb

## 2018-01-20 DIAGNOSIS — J189 Pneumonia, unspecified organism: Secondary | ICD-10-CM

## 2018-01-20 DIAGNOSIS — J9611 Chronic respiratory failure with hypoxia: Secondary | ICD-10-CM | POA: Diagnosis not present

## 2018-01-20 DIAGNOSIS — J441 Chronic obstructive pulmonary disease with (acute) exacerbation: Secondary | ICD-10-CM

## 2018-01-20 DIAGNOSIS — J181 Lobar pneumonia, unspecified organism: Secondary | ICD-10-CM

## 2018-01-20 DIAGNOSIS — E875 Hyperkalemia: Secondary | ICD-10-CM

## 2018-01-20 LAB — BASIC METABOLIC PANEL WITH GFR
BUN / CREAT RATIO: 22 (calc) (ref 6–22)
BUN: 26 mg/dL — ABNORMAL HIGH (ref 7–25)
CALCIUM: 8.8 mg/dL (ref 8.6–10.3)
CO2: 26 mmol/L (ref 20–32)
Chloride: 107 mmol/L (ref 98–110)
Creat: 1.2 mg/dL — ABNORMAL HIGH (ref 0.70–1.11)
GFR, EST AFRICAN AMERICAN: 65 mL/min/{1.73_m2} (ref >=60)
GFR, EST NON AFRICAN AMERICAN: 56 mL/min/{1.73_m2} — AB (ref >=60)
Glucose, Bld: 78 mg/dL (ref 65–99)
POTASSIUM: 4.3 mmol/L (ref 3.5–5.3)
Sodium: 142 mmol/L (ref 135–146)

## 2018-01-20 NOTE — Patient Instructions (Addendum)
Continue on Symbicort 2 puffs twice daily Continue on Spiriva daily Continue on oxygen 3 L at rest and 6 L with activity Chest x-ray today Restart pulmonary rehab when able.  Follow-up with Dr. Melvyn Novas in 1 month and as needed Please contact office for sooner follow up if symptoms do not improve or worsen or seek emergency care

## 2018-01-20 NOTE — Assessment & Plan Note (Signed)
Cont on O2 .  

## 2018-01-20 NOTE — Assessment & Plan Note (Signed)
Clinically improved Chest x-ray today  Plan Patient Instructions  Continue on Symbicort 2 puffs twice daily Continue on Spiriva daily Continue on oxygen 3 L at rest and 6 L with activity Chest x-ray today Restart pulmonary rehab when able.  Follow-up with Dr. Melvyn Novas in 1 month and as needed Please contact office for sooner follow up if symptoms do not improve or worsen or seek emergency care

## 2018-01-20 NOTE — Progress Notes (Signed)
@Patient  ID: Bruce Travis, male    DOB: 1935-04-28, 83 y.o.   MRN: [id number]  Chief Complaint  Patient presents with  . Follow-up    COPD     Referring provider: Susy Frizzle, MD  HPI: 83 year old male former smoker followed for GOLD II  COPD, oxygen dependent respiratory failure Past medical history significant for congestive heart failure, chronic kidney disease stage III  TEST   PFT's 06/08/11  FEV1  1.71 (67%) ratio 37 and no better p B2 with DLC0 54% -  PFT's  04/12/2017  FEV1 1.62  (65 % ) ratio 43  p 6 % improvement from saba p spiriva  prior to study with DLCO  24/23 % corrects to 27  % for alv volume   - 04/12/2017  After extensive coaching device effectiveness =    75% so changed to spiriva smi     - 11/15/2017  After extensive coaching inhaler device  effectiveness =    90%   01/20/2018 Follow up : COPD , PNA  Patient presents for a post hospital follow-up.  Patient was admitted earlier this month for COPD exacerbation with community-acquired pneumonia and acute on chronic respiratory failure.  He was treated with IV antibiotics, steroids and nebulized bronchodilators.  He was discharged on doxycycline.  Since discharge patient is feeling better he has decreased cough and congestion.  Breathing is getting close to baseline.  Patient gets winded with minimal activity. He remains on Symbicort and Spiriva. She is on oxygen 3 L at rest and 6 L with activity. Appetite is good. No n/v/d.  Wt steady .  Plans to go back to pulmonary rehab soon.   Follow up with Cardiology for CHF . Says doing okay with no increased leg swelling  On amiodarone , Entresto and Aldactone.    Allergies  Allergen Reactions  . Codeine Other (See Comments)    Thought head was going to blow off/ severe headache  . Adhesive [Tape] Other (See Comments)    Band-aids - tears skin off  . Gluten Meal Other (See Comments)    Celiac disease    Immunization History  Administered Date(s) Administered  .  H1N1 07/30/2008  . Influenza Split 05/24/2011  . Influenza Whole 06/23/2009, 06/02/2010, 05/23/2012  . Influenza, High Dose Seasonal PF 05/23/2017  . Influenza,inj,Quad PF,6+ Mos 05/11/2013, 05/21/2014, 05/12/2015, 05/19/2016  . Pneumococcal Conjugate-13 08/14/2013  . Pneumococcal Polysaccharide-23 10/28/2014  . Td 03/20/2009  . Tdap 04/17/2012    Past Medical History:  Diagnosis Date  . Allergic rhinitis   . Anxiety   . Bronchiectasis   . CAD (coronary artery disease)   . Celiac disease   . Chronic heart failure ([place])   . Chronic kidney disease (CKD), stage III (moderate) ([facility])   . COPD (chronic obstructive pulmonary disease) ([place])   . Diverticulosis of colon (without mention of hemorrhage)   . Esophageal reflux   . Family history of adverse reaction to anesthesia    daughter gets PONV  . Family hx of colon cancer   . History of stomach ulcers 1980s  . Hyperlipemia   . Hypertension   . 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 Myocardial infarction [facility])    "previous MI/echo in 12/2015"  . Nephrolithiasis    "got them now; never had OR/scopes" (02/03/2016)  . On home oxygen therapy    "3L-5L; 24/7" (12/29/2017)  . Other diseases of lung, not elsewhere classified   .  Personal history of colonic polyps 04/26/2007   hyperplastic   . Pneumonia "several times"    Tobacco History: Social History   Tobacco Use  Smoking Status Former Smoker  . Packs/day: 2.00  . Years: 35.00  . Pack years: 70.00  . Types: Cigarettes  . Last attempt to quit: 01/21/1989  . Years since quitting: 29.0  Smokeless Tobacco Former Systems developer  . Types: Chew  . Quit date: 08/23/2001   Counseling given: Not Answered   Outpatient Encounter Medications as of 01/20/2018  Medication Sig  . albuterol (PROAIR HFA) 108 (90 Base) MCG/ACT inhaler INHALE 2 PUFFS EVERY 4 HOURS AS NEEDED FOR SHORTNESS OF BREATH.  Marland Kitchen albuterol (PROVENTIL) (2.5 MG/3ML) 0.083% nebulizer solution Take 3 mLs (2.5  mg total) by nebulization every 4 (four) hours as needed for wheezing or shortness of breath.  . ALPRAZolam (XANAX) 0.25 MG tablet Take 1 tablet (0.25 mg total) by mouth 2 (two) times daily as needed for anxiety.  Marland Kitchen amiodarone (PACERONE) 200 MG tablet Take 0.5 tablets (100 mg total) by mouth daily.  Marland Kitchen aspirin EC 81 MG tablet Take 81 mg by mouth daily.  Marland Kitchen azelastine (ASTELIN) 0.1 % nasal spray PLACE 2 SPRAYS INTO EACH NOSTRILS TWICE DAILY AS DIRECTED.  . Calcium Carbonate-Vitamin D (CALTRATE 600+D) 600-400 MG-UNIT tablet Take 1 tablet by mouth daily.   Marland Kitchen dextromethorphan-guaiFENesin (MUCINEX DM) 30-600 MG 12hr tablet Take 1 tablet by mouth 2 (two) times daily.  Marland Kitchen ENTRESTO 49-51 MG TAKE (1) TABLET BY MOUTH TWICE DAILY.  Marland Kitchen HYDROcodone-homatropine (HYCODAN) 5-1.5 MG/5ML syrup Take 2.5 - 5 mLs PO Q 8 hours PRN for severe coughing. DO NOT TAKE at the same time as XANAX.  Marland Kitchen LUTEIN PO Take 1 capsule by mouth daily.  . montelukast (SINGULAIR) 10 MG tablet TAKE 1 TABLET BY MOUTH DAILY. (Patient taking differently: TAKE 1 TABLET (10 MG TOTALLY) BY MOUTH DAILY.)  . Multiple Vitamins-Minerals (CENTRUM SILVER 50+MEN) TABS Take 1 tablet by mouth daily.  . OXYGEN Inhale 3 L into the lungs continuous. 3lpm with rest and 6 lpm with exertion  . pantoprazole (PROTONIX) 40 MG tablet TAKE ONE TABLET BY MOUTH DAILY. (Patient taking differently: TAKE ONE TABLET (40 MG TOTALLY) BY MOUTH DAILY.)  . polyethylene glycol (MIRALAX / GLYCOLAX) packet Take 17 g by mouth daily as needed for mild constipation. Mix in 8 oz liquid and drink  . spironolactone (ALDACTONE) 25 MG tablet TAKE (1/2) TABLET BY MOUTH DAILY.  . SYMBICORT 160-4.5 MCG/ACT inhaler INHALE 2 PUFFS FIRST THING IN MORNING AND THEN ANOTHER 2 PUFFS ABOUT 12 HOURS LATER.  . tamsulosin (FLOMAX) 0.4 MG CAPS capsule Take 1 capsule (0.4 mg total) by mouth daily.  . Tiotropium Bromide Monohydrate (SPIRIVA RESPIMAT) 2.5 MCG/ACT AERS Inhale 2 puffs into the lungs daily.    No facility-administered encounter medications on file as of 01/20/2018.      Review of Systems  Constitutional:   No  weight loss, night sweats,  Fevers, chills,+fatigue, or  lassitude.  HEENT:   No headaches,  Difficulty swallowing,  Tooth/dental problems, or  Sore throat,                No sneezing, itching, ear ache, nasal congestion, post nasal drip,   CV:  No chest pain,  Orthopnea, PND, swelling in lower extremities, anasarca, dizziness, palpitations, syncope.   GI  No heartburn, indigestion, abdominal pain, nausea, vomiting, diarrhea, change in bowel habits, loss of appetite, bloody stools.   Resp:   No chest  wall deformity  Skin: no rash or lesions.  GU: no dysuria, change in color of urine, no urgency or frequency.  No flank pain, no hematuria   MS:  No joint pain or swelling.  No decreased range of motion.  No back pain.    Physical Exam  BP (!) 122/58 (BP Location: Left Arm, Cuff Size: Normal)   Pulse 72   Ht 5' 6.5" (1.689 m)   Wt 125 lb (56.7 kg)   SpO2 92%   BMI 19.87 kg/m   GEN: A/Ox3; pleasant , NAD, thin frail on o2    HEENT:  [place]/AT,  EACs-clear, TMs-wnl, NOSE-clear, THROAT-clear, no lesions, no postnasal drip or exudate noted.   NECK:  Supple w/ fair ROM; no JVD; normal carotid impulses w/o bruits; no thyromegaly or nodules palpated; no lymphadenopathy.    RESP  Decreased BS in bases . no accessory muscle use, no dullness to percussion  CARD:  RRR, no m/r/g, no peripheral edema, pulses intact, no cyanosis or clubbing.  GI:   Soft & nt; nml bowel sounds; no organomegaly or masses detected.   Musco: Warm bil, no deformities or joint swelling noted.   Neuro: alert, no focal deficits noted.    Skin: Warm, no lesions or rashes    Lab Results:  CBC    Component Value Date/Time   WBC 14.1 (H) 01/05/2018 1234   RBC 4.51 01/05/2018 1234   HGB 15.1 01/05/2018 1234   HCT 43.8 01/05/2018 1234   PLT 217 01/05/2018 1234   MCV 97.1 01/05/2018  1234   MCH 33.5 (H) 01/05/2018 1234   MCHC 34.5 01/05/2018 1234   RDW 12.9 01/05/2018 1234   LYMPHSABS 832 (L) 01/05/2018 1234   MONOABS 0.2 12/29/2017 1333   EOSABS 14 (L) 01/05/2018 1234   BASOSABS 56 01/05/2018 1234    BMET    Component Value Date/Time   NA 141 01/12/2018 1139   K 4.7 01/12/2018 1139   CL 108 01/12/2018 1139   CO2 27 01/12/2018 1139   GLUCOSE 93 01/12/2018 1139   BUN 25 01/12/2018 1139   CREATININE 1.04 01/12/2018 1139   CALCIUM 9.1 01/12/2018 1139   GFRNONAA 67 01/12/2018 1139   GFRAA 78 01/12/2018 1139    BNP    Component Value Date/Time   BNP 47 01/09/2018 1032    ProBNP    Component Value Date/Time   PROBNP 210.0 07/05/2014 1104    Imaging: Dg Chest 2 View  Result Date: 01/20/2018 CLINICAL DATA:  Cough and weakness. EXAM: CHEST - 2 VIEW COMPARISON:  Jan 05, 2018 and Dec 29, 2017 FINDINGS: There is a calcified granuloma in the right lower lobe. Lungs are hyperexpanded with areas of scarring throughout the lungs, stable. There is prominence of the retrosternal clear space region. There is fibrosis in the lung base regions. There is currently no appreciable edema or consolidation. Heart size is within normal limits. There is diminished vascularity in the upper lobes consistent with scarring and apparent emphysematous change. No evident adenopathy. There is aortic atherosclerosis. No bone lesions. IMPRESSION: Underlying emphysematous change with areas of scarring and fibrosis. Calcified granuloma right lower lobe. Currently no edema or consolidation. No adenopathy. There is aortic atherosclerosis. Aortic Atherosclerosis (ICD10-I70.0) and Emphysema (ICD10-J43.9). Electronically Signed   By: Lowella Grip III M.D.   On: 01/20/2018 12:35   Dg Chest 2 View  Result Date: 01/05/2018 CLINICAL DATA:  Worsening shortness of breath and cough. Recent diagnosis of pneumonia. EXAM: CHEST - 2 VIEW  COMPARISON:  PA and lateral chest 12/29/2017, 12/26/2017,  11/15/2017 and 01/27/2017. FINDINGS: Bullous emphysematous disease is again seen. Bilateral lower lobe airspace disease seen on the most recent examinations is markedly improved. No new airspace disease is present. No pneumothorax or pleural effusion. Heart size is normal. Aortic atherosclerosis is noted. No acute bony abnormality. IMPRESSION: Marked improvement in bilateral lower lobe airspace disease consistent with resolving pneumonia. Bullous emphysema. Atherosclerosis. Electronically Signed   By: Inge Rise M.D.   On: 01/05/2018 14:32   Dg Chest 2 View  Result Date: 12/29/2017 CLINICAL DATA:  Productive cough.  Denies current chest pain. EXAM: CHEST - 2 VIEW COMPARISON:  12/26/2017. FINDINGS: Progression of bibasilar opacities since the previous radiograph, suspected worsening LEFT and developing RIGHT lower lobe pneumonia. No effusion or pneumothorax. Stable cardiac silhouette. Calcified tortuous aorta. No pneumothorax. Hyperaeration. Large granuloma RIGHT lung base. IMPRESSION: Worsening aeration, consistent with progressive pneumonia. Electronically Signed   By: Staci Righter M.D.   On: 12/29/2017 16:08   Dg Chest 2 View  Result Date: 12/26/2017 CLINICAL DATA:  Cough, increased shortness of breath over the last 2 weeks EXAM: CHEST - 2 VIEW COMPARISON:  Chest x-ray of 11/15/2016 FINDINGS: There are more prominent markings at the left lung base posteriorly and pneumonia is a definite consideration. The lungs remain hyperaerated with flattened hemidiaphragms and increased AP diameter consistent with emphysematous change. Stable pleural-parenchymal scarring in the apices is noted. Calcified granuloma in the right lung base is unchanged. Mediastinal and hilar contours are stable and the heart is within normal limits in size. No acute bony abnormality is seen. IMPRESSION: 1. Prominent markings at the left lung base may represent focus of pneumonia. Recommend continued follow-up. 2. Hyperaeration  suggests emphysematous change. Electronically Signed   By: Ivar Drape M.D.   On: 12/26/2017 16:57     Assessment & Plan:   COPD exacerbation ([place]) Recent flare now resolved  Plan Patient Instructions  Continue on Symbicort 2 puffs twice daily Continue on Spiriva daily Continue on oxygen 3 L at rest and 6 L with activity Chest x-ray today Restart pulmonary rehab when able.  Follow-up with Dr. Melvyn Novas in 1 month and as needed Please contact office for sooner follow up if symptoms do not improve or worsen or seek emergency care      Community acquired pneumonia of left lower lobe of lung ([place]) Clinically improved Chest x-ray today  Plan Patient Instructions  Continue on Symbicort 2 puffs twice daily Continue on Spiriva daily Continue on oxygen 3 L at rest and 6 L with activity Chest x-ray today Restart pulmonary rehab when able.  Follow-up with Dr. Melvyn Novas in 1 month and as needed Please contact office for sooner follow up if symptoms do not improve or worsen or seek emergency care      Chronic respiratory failure with hypoxia ([place]) Cont on O2      Shatia Sindoni, NP 01/20/2018

## 2018-01-20 NOTE — Assessment & Plan Note (Signed)
Recent flare now resolved  Plan Patient Instructions  Continue on Symbicort 2 puffs twice daily Continue on Spiriva daily Continue on oxygen 3 L at rest and 6 L with activity Chest x-ray today Restart pulmonary rehab when able.  Follow-up with Dr. Melvyn Novas in 1 month and as needed Please contact office for sooner follow up if symptoms do not improve or worsen or seek emergency care

## 2018-01-21 NOTE — Progress Notes (Signed)
Chart and office note reviewed in detail  > agree with a/p as outlined    

## 2018-01-24 ENCOUNTER — Ambulatory Visit
Admission: RE | Admit: 2018-01-24 | Discharge: 2018-01-24 | Disposition: A | Discharge status: Home / Self Care | Payer: Medicare Other | Source: Ambulatory Visit | Attending: Family Medicine | Admitting: Family Medicine

## 2018-01-24 ENCOUNTER — Encounter (HOSPITAL_COMMUNITY): Payer: Self-pay

## 2018-01-24 ENCOUNTER — Ambulatory Visit: Payer: Medicare Other | Admitting: Family Medicine

## 2018-01-24 ENCOUNTER — Encounter: Payer: Self-pay | Admitting: Family Medicine

## 2018-01-24 VITALS — BP 130/60 | HR 84 | Temp 97.7°F | Resp 18 | Ht 66.5 in | Wt 125.8 lb

## 2018-01-24 DIAGNOSIS — R0602 Shortness of breath: Secondary | ICD-10-CM | POA: Diagnosis not present

## 2018-01-24 DIAGNOSIS — R05 Cough: Secondary | ICD-10-CM

## 2018-01-24 DIAGNOSIS — I5021 Acute systolic (congestive) heart failure: Secondary | ICD-10-CM | POA: Insufficient documentation

## 2018-01-24 MED ORDER — AMOXICILLIN-POT CLAVULANATE 875-125 MG PO TABS: 1.0000 | ORAL_TABLET | Freq: Two times a day (BID) | ORAL | 0 refills | Status: DC

## 2018-01-24 MED ORDER — IPRATROPIUM-ALBUTEROL 0.5-2.5 (3) MG/3ML IN SOLN
3.0000 mL | Freq: Once | RESPIRATORY_TRACT | Status: AC
  Administered 2018-01-24: 3 mL via RESPIRATORY_TRACT

## 2018-01-24 MED ORDER — PREDNISONE 20 MG PO TABS: ORAL_TABLET | ORAL | 0 refills | Status: DC

## 2018-01-24 MED ORDER — DOXYCYCLINE HYCLATE 100 MG PO TABS: 100.0000 mg | ORAL_TABLET | Freq: Two times a day (BID) | ORAL | 0 refills | Status: DC

## 2018-01-24 NOTE — Addendum Note (Signed)
Addended by: Delsa Grana on: 01/24/2018 03:34 PM   Modules accepted: Orders

## 2018-01-24 NOTE — Progress Notes (Signed)
CXR did show aspiration pneumonia in the left lower lung, start antibiotics, increase the steroids as discussed and return for quick recheck on Friday before the weekend. (I have changed his antibiotic to cover aspiration pneumonia)

## 2018-01-24 NOTE — Progress Notes (Signed)
Patient ID: FED CECI, male    DOB: 26-Feb-1935, 83 y.o.   MRN: [id number]  PCP: Susy Frizzle, MD  Chief Complaint  Patient presents with  . chest congestion    Subjective:   SEELEY HISSONG is a 83 y.o. male, presents to clinic with CC of worsening cough and sputum production, relative to his baseline, after " aspirating" his food 4 days ago.  States that he is recently improving from pneumonia about a month ago and was last seen about 5 days ago by his PCP with repeat a chest x-ray that showed improvement.  He has been weaning down his steroids and is currently taking 5 mg daily.  Is likely end-stage COPD with chronic respiratory failure on 3 L of oxygen via nasal cannula.  States that Saturday to Sunday night after he aspirated on Saturday, he began to cough much more his sputum was gray.  He felt that exertional dyspnea was worse than the week before, also frequency, severity of cough and amount of sputum production was more than a week ago.  He believes that 2-3 nights ago he coughed up what he aspirated and spitting in a cup but did not turn the light on or look at what he had coughed up.  His cough continues to be worse at night when lying flat, which is normal for him.  States that for the past 2 days he has felt bad with decreased energy, felt more weak, worse sleep.  With exertion he will turn his oxygen up to 6 L so he can get up and do dishes or walk around.  He did 2 breathing treatments each day and they seem to help with his symptoms briefly.  Last breathing treatment was 9 AM this morning.  Most recent visits in office were reviewed as well as his recent hospitalization, he had been on doxycycline and amoxicillin for pneumonia.  Chest x-rays from May 9, May 16 and May 31 were reviewed.     Patient Active Problem List   Diagnosis Date Noted  . Chronic respiratory failure with hypoxia ([place]) 03/02/2017  . CAD (coronary artery disease)   . Chronic kidney disease (CKD), stage III  (moderate) ([facility])   . Community acquired pneumonia of left lower lobe of lung ([place]) 08/06/2016  . Heart failure ([place]) 02/03/2016  . Cardiomyopathy, ischemic 01/06/2016  . History of acute inferior wall MI 01/06/2016  . PVC's (premature ventricular contractions) 12/12/2015  . Insomnia 04/16/2015  . Nocturnal hypoxemia 03/06/2013  . Acute respiratory failure with hypoxia ([place]) 01/10/2012  . Allergic rhinitis 01/10/2012  . Hypertension 01/10/2012  . COPD exacerbation ([place]) 08/11/2011  . Dysphagia 04/13/2011  . Hoarseness 04/02/2011  . Celiac disease 12/15/2010  . SINUSITIS, CHRONIC 09/10/2009  . ARTHUS PHENOMENON 10/05/2007  . Multiple pulmonary nodules determined by computed tomography of lung assoc with bronchiectasis 08/11/2007  . COPD GOLD II  07/06/2007  . GERD 07/06/2007     Prior to Admission medications   Medication Sig Start Date End Date Taking? Authorizing Provider  albuterol (PROAIR HFA) 108 (90 Base) MCG/ACT inhaler INHALE 2 PUFFS EVERY 4 HOURS AS NEEDED FOR SHORTNESS OF BREATH. 11/10/16  Yes Susy Frizzle, MD  albuterol (PROVENTIL) (2.5 MG/3ML) 0.083% nebulizer solution Take 3 mLs (2.5 mg total) by nebulization every 4 (four) hours as needed for wheezing or shortness of breath. 01/12/18  Yes Pickard, Cammie Mcgee, MD  ALPRAZolam Duanne Moron) 0.25 MG tablet Take 1 tablet (0.25 mg total) by  mouth 2 (two) times daily as needed for anxiety. 11/17/17  Yes Delsa Grana, PA-C  amiodarone (PACERONE) 200 MG tablet Take 0.5 tablets (100 mg total) by mouth daily. 03/03/17  Yes Bensimhon, Shaune Pascal, MD  aspirin EC 81 MG tablet Take 81 mg by mouth daily.   Yes [provider]  azelastine (ASTELIN) 0.1 % nasal spray PLACE 2 SPRAYS INTO EACH NOSTRILS TWICE DAILY AS DIRECTED. 12/13/17  Yes Susy Frizzle, MD  Calcium Carbonate-Vitamin D (CALTRATE 600+D) 600-400 MG-UNIT tablet Take 1 tablet by mouth daily.    Yes [provider]  dextromethorphan-guaiFENesin (MUCINEX DM) 30-600 MG  12hr tablet Take 1 tablet by mouth 2 (two) times daily.   Yes [provider]  ENTRESTO 49-51 MG TAKE (1) TABLET BY MOUTH TWICE DAILY. 05/27/17  Yes Bensimhon, Shaune Pascal, MD  HYDROcodone-homatropine [facility]) 5-1.5 MG/5ML syrup Take 2.5 - 5 mLs PO Q 8 hours PRN for severe coughing. DO NOT TAKE at the same time as XANAX. 11/17/17  Yes Delsa Grana, PA-C  LUTEIN PO Take 1 capsule by mouth daily.   Yes [provider]  montelukast (SINGULAIR) 10 MG tablet TAKE 1 TABLET BY MOUTH DAILY. Patient taking differently: TAKE 1 TABLET (10 MG TOTALLY) BY MOUTH DAILY. 05/02/17  Yes Susy Frizzle, MD  Multiple Vitamins-Minerals (CENTRUM SILVER 50+MEN) TABS Take 1 tablet by mouth daily.   Yes [provider]  OXYGEN Inhale 3 L into the lungs continuous. 3lpm with rest and 6 lpm with exertion   Yes [provider]  pantoprazole (PROTONIX) 40 MG tablet TAKE ONE TABLET BY MOUTH DAILY. Patient taking differently: TAKE ONE TABLET (40 MG TOTALLY) BY MOUTH DAILY. 09/21/17  Yes Susy Frizzle, MD  polyethylene glycol (MIRALAX / GLYCOLAX) packet Take 17 g by mouth daily as needed for mild constipation. Mix in 8 oz liquid and drink   Yes [provider]  spironolactone (ALDACTONE) 25 MG tablet TAKE (1/2) TABLET BY MOUTH DAILY. 12/28/17  Yes Bensimhon, Shaune Pascal, MD  SYMBICORT 160-4.5 MCG/ACT inhaler INHALE 2 PUFFS FIRST THING IN MORNING AND THEN ANOTHER 2 PUFFS ABOUT 12 HOURS LATER. 01/12/17  Yes Susy Frizzle, MD  tamsulosin (FLOMAX) 0.4 MG CAPS capsule Take 1 capsule (0.4 mg total) by mouth daily. 11/29/17  Yes Susy Frizzle, MD  Tiotropium Bromide Monohydrate (SPIRIVA RESPIMAT) 2.5 MCG/ACT AERS Inhale 2 puffs into the lungs daily. 04/12/17  Yes Tanda Rockers, MD     Allergies  Allergen Reactions  . Codeine Other (See Comments)    Thought head was going to blow off/ severe headache  . Adhesive [Tape] Other (See Comments)    Band-aids - tears skin off  . Gluten Meal  Other (See Comments)    Celiac disease     Family History  Problem Relation Age of Onset  . Heart disease Father   . Colon cancer Father   . Prostate cancer Father   . Stroke Mother   . Endometrial cancer Sister   . Stroke Maternal Grandmother   . Cancer Paternal Grandmother      Social History   Socioeconomic History  . Marital status: Married    Spouse name: Not on file  . Number of children: 3  . Years of education: Not on file  . Highest education level: Not on file  Occupational History  . 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 Financial resource strain:  Not on file  . Food insecurity:    Worry: Not on file    Inability: Not on file  . Transportation needs:    Medical: Not on file    Non-medical: Not on file  Tobacco Use  . Smoking status: Former Smoker    Packs/day: 2.00    Years: 35.00    Pack years: 70.00    Types: Cigarettes    Last attempt to quit: 01/21/1989    Years since quitting: 29.0  . Smokeless tobacco: Former Systems developer    Types: [place] date: 08/23/2001  Substance and Sexual Activity  . Alcohol use: No  . Drug use: No  . Sexual activity: Not on file  Lifestyle  . Physical activity:    Days per week: Not on file    Minutes per session: Not on file  . Stress: Not on file  Relationships  . Social connections:    Talks on phone: Not on file    Gets together: Not on file    Attends religious service: Not on file    Active member of club or organization: Not on file    Attends meetings of clubs or organizations: Not on file    Relationship status: Not on file  . Intimate partner violence:    Fear of current or ex partner: Not on file    Emotionally abused: Not on file    Physically abused: Not on file    Forced sexual activity: Not on file  Other Topics Concern  . Not on file  Social History Narrative  . Not on file     Review of Systems  Constitutional: Positive for activity  change and fatigue. Negative for appetite change, chills, diaphoresis, fever and unexpected weight change.  HENT: Positive for congestion. Negative for rhinorrhea, sinus pain, sore throat, trouble swallowing and voice change.   Eyes: Negative.   Respiratory: Positive for cough, choking, chest tightness, shortness of breath and wheezing.   Cardiovascular: Negative.  Negative for chest pain, palpitations and leg swelling.  Gastrointestinal: Negative.   Endocrine: Negative.   Genitourinary: Negative.   Musculoskeletal: Negative.   Skin: Negative.   Allergic/Immunologic: Negative.   Neurological: Negative.   Hematological: Negative.   Psychiatric/Behavioral: Negative.   All other systems reviewed and are negative.      Objective:    Vitals:   01/24/18 1112  BP: 130/60  Pulse: 84  Resp: 18  Temp: 97.7 F (36.5 C)  TempSrc: Oral  SpO2: 92%  Weight: 125 lb 12.8 oz (57.1 kg)  Height: 5' 6.5" (1.689 m)      Physical Exam  Constitutional: He is oriented to person, place, and time. He appears well-developed and well-nourished.  Non-toxic appearance. He does not appear ill. No distress.  Thin elderly male, appears stated age, no acute distress, nontoxic-appearing, seated in exam room, on oxygen via nasal cannula  HENT:  Head: Normocephalic and atraumatic.  Right Ear: Tympanic membrane, external ear and ear canal normal.  Left Ear: Tympanic membrane, external ear and ear canal normal.  Nose: Nose normal. No mucosal edema or rhinorrhea. Right sinus exhibits no maxillary sinus tenderness and no frontal sinus tenderness. Left sinus exhibits no maxillary sinus tenderness and no frontal sinus tenderness.  Mouth/Throat: Uvula is midline and oropharynx is clear and moist. No trismus in the jaw. No uvula swelling. No oropharyngeal exudate, posterior oropharyngeal edema or posterior oropharyngeal erythema.  Eyes: Pupils are equal, round, and reactive to light. Conjunctivae, EOM  and lids are  normal.  Neck: Trachea normal, normal range of motion and phonation normal. Neck supple. No tracheal deviation present.  Cardiovascular: Regular rhythm, normal heart sounds and normal pulses. Exam reveals no gallop and no friction rub.  No murmur heard. Pulses:      Radial pulses are 2+ on the right side, and 2+ on the left side.       Posterior tibial pulses are 2+ on the right side, and 2+ on the left side.  Pulmonary/Chest: Effort normal. He has wheezes. He has no rhonchi. He has no rales. He exhibits no tenderness.  Diminished breath sounds throughout all lung fields, scattered rhonchi throughout, faint expiratory wheezes in the right side At rest no retractions, accessory muscle use or tachypnea  Abdominal: Soft. Normal appearance and bowel sounds are normal. He exhibits no distension. There is no tenderness. There is no rebound and no guarding.  Musculoskeletal: Normal range of motion. He exhibits no edema.  Lymphadenopathy:    He has no cervical adenopathy.  Neurological: He is alert and oriented to person, place, and time. Gait normal.  Skin: Skin is warm, dry and intact. Capillary refill takes less than 2 seconds. No rash noted. He is not diaphoretic.  Psychiatric: He has a normal mood and affect. His speech is normal and behavior is normal.  Nursing note and vitals reviewed.         Assessment & Plan:      ICD-10-CM   1. SOB (shortness of breath) R06.02 DG Chest 2 View    predniSONE (DELTASONE) 20 MG tablet    doxycycline (VIBRA-TABS) 100 MG tablet    ipratropium-albuterol (DUONEB) 0.5-2.5 (3) MG/3ML nebulizer solution 3 mL   cough and SOB, acute exacerbation of COPD? vs aspiration PNA?      Patient with history of chronic respiratory failure, COPD, heart failure, recently recovered from hospitalization and pneumonia, chest x-ray from 5 days showed no infiltrate but did show significant emphysema and airspace.   He returns with worsening cough and shortness of breath after  aspiration of food 3 to 4 days ago, cough and sputum production and exertional shortness of breath is worse than his normal.  He does have improvement briefly with his home nebulizer and albuterol.  No other significant associated symptoms or change from his baseline.  He was given a DuoNeb in clinic, he had somewhat increased aeration to the right mid lower lungs.  Discussed increasing his prednisone burst for 6 days and then returning to 10 mg daily with follow-up with his PCP.  Will reobtain chest x-ray to evaluate for aspiration pneumonia.  Will cover with doxycycline patient will need close follow-up, likely reevaluation on Friday before the weekend and then another follow-up visit in 2 weeks.   Delsa Grana, PA-C 01/24/18 11:25 AM

## 2018-01-26 ENCOUNTER — Encounter (HOSPITAL_COMMUNITY): Payer: Self-pay

## 2018-01-27 ENCOUNTER — Other Ambulatory Visit: Payer: Self-pay

## 2018-01-27 ENCOUNTER — Telehealth: Payer: Self-pay | Admitting: Family Medicine

## 2018-01-27 ENCOUNTER — Encounter: Payer: Self-pay | Admitting: Family Medicine

## 2018-01-27 ENCOUNTER — Other Ambulatory Visit: Payer: Self-pay | Admitting: Family Medicine

## 2018-01-27 ENCOUNTER — Ambulatory Visit: Payer: Medicare Other | Admitting: Family Medicine

## 2018-01-27 VITALS — BP 124/60 | HR 89 | Temp 97.9°F | Resp 18 | Wt 127.0 lb

## 2018-01-27 DIAGNOSIS — J690 Pneumonitis due to inhalation of food and vomit: Secondary | ICD-10-CM

## 2018-01-27 DIAGNOSIS — J441 Chronic obstructive pulmonary disease with (acute) exacerbation: Secondary | ICD-10-CM

## 2018-01-27 MED ORDER — HYDROCODONE-HOMATROPINE 5-1.5 MG/5ML PO SYRP: ORAL_SOLUTION | ORAL | 0 refills | Status: DC

## 2018-01-27 MED ORDER — LEVOFLOXACIN 500 MG PO TABS: 500.0000 mg | ORAL_TABLET | Freq: Every day | ORAL | 0 refills | Status: DC

## 2018-01-27 MED ORDER — BUDESONIDE-FORMOTEROL FUMARATE 160-4.5 MCG/ACT IN AERO: INHALATION_SPRAY | RESPIRATORY_TRACT | 11 refills | Status: DC

## 2018-01-27 NOTE — Patient Instructions (Addendum)
Add levaquin to other antibiotic (augmentin) Follow up next week.  Please call us if you need anything  We have discussed plan for ER intervention if you get distressed to help give you support, breathing treatments, IV medications if needed.

## 2018-01-27 NOTE — Telephone Encounter (Signed)
Patient says he forgot to tell leisa that he needed a refill on his hycodan  Please sent to [facility]

## 2018-01-27 NOTE — Telephone Encounter (Signed)
Spoke with patient and informed him per Hinda Glatter- refill on hycodan was sent into pharmacy. Patient verbalized understanding.

## 2018-01-27 NOTE — Progress Notes (Signed)
Patient ID: Bruce Travis, male    DOB: 1934-12-09, 83 y.o.   MRN: [id number]  PCP: Susy Frizzle, MD  Chief Complaint  Patient presents with  . Shortness of Breath    Subjective:   Bruce Travis is a 83 y.o. male, presents to clinic with CC of shortness of breath continues to be worse than his baseline secondary to aspiration pneumonia, diagnosed 3 days ago, currently being treated with Augmentin and steroid burst. He continues to have increased sputum production that is gray to yellow in color, his coughing severity and frequency is slightly  improved from 3 days ago, he has turned up his oxygen to 4 L when at rest just to help him "feel okay".  He still feels fairly ill.  He did have plans to go to the beach this weekend and he is not sure if he should go.  He just wants to "feel better." He does reiterate that he is DNI/DNR, but would like treatment for recurrent illness. He does need more cough medicine today.   Patient Active Problem List   Diagnosis Date Noted  . Chronic respiratory failure with hypoxia ([place]) 03/02/2017  . CAD (coronary artery disease)   . Chronic kidney disease (CKD), stage III (moderate) (HCC)   . Community acquired pneumonia of left lower lobe of lung ([place]) 08/06/2016  . Heart failure ([place]) 02/03/2016  . Cardiomyopathy, ischemic 01/06/2016  . History of acute inferior wall MI 01/06/2016  . PVC's (premature ventricular contractions) 12/12/2015  . Insomnia 04/16/2015  . Nocturnal hypoxemia 03/06/2013  . Acute respiratory failure with hypoxia ([place]) 01/10/2012  . Allergic rhinitis 01/10/2012  . Hypertension 01/10/2012  . COPD exacerbation ([place]) 08/11/2011  . Dysphagia 04/13/2011  . Hoarseness 04/02/2011  . Celiac disease 12/15/2010  . SINUSITIS, CHRONIC 09/10/2009  . ARTHUS PHENOMENON 10/05/2007  . Multiple pulmonary nodules determined by computed tomography of lung assoc with bronchiectasis 08/11/2007  . COPD GOLD II  07/06/2007  . GERD 07/06/2007      Prior to Admission medications   Medication Sig Start Date End Date Taking? Authorizing Provider  albuterol (PROAIR HFA) 108 (90 Base) MCG/ACT inhaler INHALE 2 PUFFS EVERY 4 HOURS AS NEEDED FOR SHORTNESS OF BREATH. 11/10/16  Yes Susy Frizzle, MD  albuterol (PROVENTIL) (2.5 MG/3ML) 0.083% nebulizer solution Take 3 mLs (2.5 mg total) by nebulization every 4 (four) hours as needed for wheezing or shortness of breath. 01/12/18  Yes Susy Frizzle, MD  ALPRAZolam Duanne Moron) 0.25 MG tablet Take 1 tablet (0.25 mg total) by mouth 2 (two) times daily as needed for anxiety. 11/17/17  Yes Delsa Grana, PA-C  amiodarone (PACERONE) 200 MG tablet Take 0.5 tablets (100 mg total) by mouth daily. 03/03/17  Yes Bensimhon, Shaune Pascal, MD  amoxicillin-clavulanate (AUGMENTIN) 875-125 MG tablet Take 1 tablet by mouth 2 (two) times daily. 01/24/18  Yes Delsa Grana, PA-C  aspirin EC 81 MG tablet Take 81 mg by mouth daily.   Yes [provider]  azelastine (ASTELIN) 0.1 % nasal spray PLACE 2 SPRAYS INTO EACH NOSTRILS TWICE DAILY AS DIRECTED. 12/13/17  Yes Susy Frizzle, MD  Calcium Carbonate-Vitamin D (CALTRATE 600+D) 600-400 MG-UNIT tablet Take 1 tablet by mouth daily.    Yes [provider]  dextromethorphan-guaiFENesin (MUCINEX DM) 30-600 MG 12hr tablet Take 1 tablet by mouth 2 (two) times daily.   Yes [provider]  ENTRESTO 49-51 MG TAKE (1) TABLET BY MOUTH TWICE DAILY. 05/27/17  Yes Bensimhon, Quillian Quince  R, MD  HYDROcodone-homatropine (HYCODAN) 5-1.5 MG/5ML syrup Take 2.5 - 5 mLs PO Q 8 hours PRN for severe coughing. DO NOT TAKE at the same time as XANAX. 11/17/17  Yes Delsa Grana, PA-C  LUTEIN PO Take 1 capsule by mouth daily.   Yes [provider]  montelukast (SINGULAIR) 10 MG tablet TAKE 1 TABLET BY MOUTH DAILY. Patient taking differently: TAKE 1 TABLET (10 MG TOTALLY) BY MOUTH DAILY. 05/02/17  Yes Susy Frizzle, MD  Multiple Vitamins-Minerals (CENTRUM SILVER 50+MEN)  TABS Take 1 tablet by mouth daily.   Yes [provider]  OXYGEN Inhale 3 L into the lungs continuous. 3lpm with rest and 6 lpm with exertion   Yes [provider]  pantoprazole (PROTONIX) 40 MG tablet TAKE ONE TABLET BY MOUTH DAILY. Patient taking differently: TAKE ONE TABLET (40 MG TOTALLY) BY MOUTH DAILY. 09/21/17  Yes Susy Frizzle, MD  polyethylene glycol (MIRALAX / GLYCOLAX) packet Take 17 g by mouth daily as needed for mild constipation. Mix in 8 oz liquid and drink   Yes [provider]  predniSONE (DELTASONE) 20 MG tablet Take 2 tabs (40 mg) PO q d x 5 d, then take 1 tab (20 mg) PO q d x 5 days 01/24/18  Yes Delsa Grana, PA-C  spironolactone (ALDACTONE) 25 MG tablet TAKE (1/2) TABLET BY MOUTH DAILY. 12/28/17  Yes Bensimhon, Shaune Pascal, MD  SYMBICORT 160-4.5 MCG/ACT inhaler INHALE 2 PUFFS FIRST THING IN MORNING AND THEN ANOTHER 2 PUFFS ABOUT 12 HOURS LATER. 01/12/17  Yes Susy Frizzle, MD  tamsulosin (FLOMAX) 0.4 MG CAPS capsule Take 1 capsule (0.4 mg total) by mouth daily. 11/29/17  Yes Susy Frizzle, MD  Tiotropium Bromide Monohydrate (SPIRIVA RESPIMAT) 2.5 MCG/ACT AERS Inhale 2 puffs into the lungs daily. 04/12/17  Yes Tanda Rockers, MD     Allergies  Allergen Reactions  . Codeine Other (See Comments)    Thought head was going to blow off/ severe headache  . Adhesive [Tape] Other (See Comments)    Band-aids - tears skin off  . Gluten Meal Other (See Comments)    Celiac disease     Family History  Problem Relation Age of Onset  . Heart disease Father   . Colon cancer Father   . Prostate cancer Father   . Stroke Mother   . Endometrial cancer Sister   . Stroke Maternal Grandmother   . Cancer Paternal Grandmother      Social History   Socioeconomic History  . Marital status: Married    Spouse name: Not on file  . Number of children: 3  . Years of education: Not on file  . Highest education level: Not on file  Occupational History  .  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 Financial resource strain: Not on file  . Food insecurity:    Worry: Not on file    Inability: Not on file  . Transportation needs:    Medical: Not on file    Non-medical: Not on file  Tobacco Use  . Smoking status: Former Smoker    Packs/day: 2.00    Years: 35.00    Pack years: 70.00    Types: Cigarettes    Last attempt to quit: 01/21/1989    Years since quitting: 29.0  . Smokeless tobacco: Former Systems developer    Types: [place] date: 08/23/2001  Substance and Sexual  Activity  . Alcohol use: No  . Drug use: No  . Sexual activity: Not on file  Lifestyle  . Physical activity:    Days per week: Not on file    Minutes per session: Not on file  . Stress: Not on file  Relationships  . Social connections:    Talks on phone: Not on file    Gets together: Not on file    Attends religious service: Not on file    Active member of club or organization: Not on file    Attends meetings of clubs or organizations: Not on file    Relationship status: Not on file  . Intimate partner violence:    Fear of current or ex partner: Not on file    Emotionally abused: Not on file    Physically abused: Not on file    Forced sexual activity: Not on file  Other Topics Concern  . Not on file  Social History Narrative  . Not on file     Review of Systems  Constitutional: Positive for activity change (still doing less than normal for him) and fatigue (still feeling more tired than normal). Negative for appetite change, chills, diaphoresis, fever and unexpected weight change.  HENT: Negative for congestion, rhinorrhea, sinus pain, sore throat, trouble swallowing and voice change.   Eyes: Negative.   Respiratory: Positive for cough, shortness of breath and wheezing. Negative for choking, chest tightness and stridor.   Cardiovascular: Negative.  Negative for chest pain, palpitations and leg swelling.    Gastrointestinal: Negative.   Endocrine: Negative.   Genitourinary: Negative.   Musculoskeletal: Negative.   Skin: Negative.   Allergic/Immunologic: Negative.   Neurological: Negative.   Hematological: Negative.   Psychiatric/Behavioral: Negative.   All other systems reviewed and are negative.      Objective:    Vitals:   01/27/18 0807  BP: 124/60  Pulse: 89  Resp: 18  Temp: 97.9 F (36.6 C)  TempSrc: Oral  SpO2: 93%  Weight: 127 lb (57.6 kg)      Physical Exam  Constitutional: He is oriented to person, place, and time. He appears well-developed and well-nourished.  Non-toxic appearance. He does not appear ill. No distress.  Thin elderly male, appears stated age, no acute distress, nontoxic-appearing, seated in exam room, on oxygen via nasal cannula  HENT:  Head: Normocephalic and atraumatic.  Right Ear: Tympanic membrane, external ear and ear canal normal.  Left Ear: Tympanic membrane, external ear and ear canal normal.  Nose: Nose normal. No mucosal edema or rhinorrhea. Right sinus exhibits no maxillary sinus tenderness and no frontal sinus tenderness. Left sinus exhibits no maxillary sinus tenderness and no frontal sinus tenderness.  Mouth/Throat: Uvula is midline and oropharynx is clear and moist. No trismus in the jaw. No uvula swelling. No oropharyngeal exudate, posterior oropharyngeal edema or posterior oropharyngeal erythema.  Eyes: Pupils are equal, round, and reactive to light. Conjunctivae and lids are normal.  Neck: Trachea normal, normal range of motion and phonation normal. Neck supple. No tracheal deviation present.  Cardiovascular: Normal rate, regular rhythm, normal heart sounds and normal pulses. Exam reveals no gallop and no friction rub.  No murmur heard. Pulses:      Radial pulses are 2+ on the right side, and 2+ on the left side.       Posterior tibial pulses are 2+ on the right side, and 2+ on the left side.  Pulmonary/Chest: No accessory muscle  usage or stridor. Tachypnea (mildly) noted.  No respiratory distress. He has decreased breath sounds in the right lower field and the left lower field. He has no wheezes. He has no rhonchi. He has no rales. He exhibits no tenderness.  On 3 L O2 via [place]  Abdominal: Soft. Normal appearance and bowel sounds are normal. He exhibits no distension. There is no tenderness. There is no rebound and no guarding.  Musculoskeletal: Normal range of motion. He exhibits no edema.  Lymphadenopathy:    He has no cervical adenopathy.  Neurological: He is alert and oriented to person, place, and time. He exhibits normal muscle tone. Coordination and gait normal.  Skin: Skin is warm, dry and intact. Capillary refill takes less than 2 seconds. No rash noted. He is not diaphoretic. No cyanosis.  Psychiatric: He has a normal mood and affect. His speech is normal and behavior is normal.  Nursing note and vitals reviewed.         Assessment & Plan:      ICD-10-CM   1. Aspiration pneumonia of left lower lobe, unspecified aspiration pneumonia type ([place]) J69.0 levofloxacin (LEVAQUIN) 500 MG tablet  2. COPD with acute exacerbation (HCC) J44.1 budesonide-formoterol (SYMBICORT) 160-4.5 MCG/ACT inhaler    HYDROcodone-homatropine (HYCODAN) 5-1.5 MG/5ML syrup    Patient with COPD with exacerbation, chronic respiratory failure on 3 L oxygen via nasal cannula at baseline, presents for recheck after unfortunate recent diagnosis of aspiration pneumonia to the left lower lobe.  Noted on Augmentin per up-to-date recommendations, he is here for recheck, feels slightly better but still feels ill.  Discussed with patient's PCP, Dr. Dennard Schaumann did personally seen and evaluated Mr. Beltre, he advised adding Levaquin to his antibiotic treatment.  Several of his home medication were refilled for him, and arranged with pharmacy to be delivered to him at home.  Did discuss with him at length his wishes including when is appropriate to go to the ER  if he is in distress, and what can be done for him with his DNI DNR which he states is filed with [facility] and easily found in his home should EMS ever have to come get him.     Delsa Grana, PA-C 01/27/18 8:26 AM

## 2018-01-31 ENCOUNTER — Encounter (HOSPITAL_COMMUNITY): Payer: Self-pay

## 2018-02-02 ENCOUNTER — Encounter (HOSPITAL_COMMUNITY): Payer: Self-pay

## 2018-02-03 ENCOUNTER — Ambulatory Visit: Payer: Medicare Other | Admitting: Family Medicine

## 2018-02-03 ENCOUNTER — Encounter: Payer: Self-pay | Admitting: Family Medicine

## 2018-02-03 VITALS — BP 120/70 | HR 88 | Temp 97.6°F | Resp 20 | Ht 66.5 in | Wt 127.0 lb

## 2018-02-03 DIAGNOSIS — J690 Pneumonitis due to inhalation of food and vomit: Secondary | ICD-10-CM

## 2018-02-03 NOTE — Progress Notes (Signed)
Subjective:    Patient ID: Bruce Travis, male    DOB: 1934/11/20, 83 y.o.   MRN: [id number]  HPI  Patient recently saw my partner and was treated for aspiration pneumonia initially with Augmentin and then Levaquin was also added as the patient was experiencing delayed recovery.  He states that his breathing is doing much better now.  He is not yet back to his baseline but he is much improved.  He denies any fever.  He denies any chest pain.  His oxygen is 95% on 3 L which is better than his baseline previously.  He is ambulating without difficulty.  He is actually planning to go to the beach next week for vacation as this is the best he is felt in quite some time. Past Medical History:  Diagnosis Date  . Allergic rhinitis   . Anxiety   . Bronchiectasis   . CAD (coronary artery disease)   . Celiac disease   . Chronic heart failure ([place])   . Chronic kidney disease (CKD), stage III (moderate) (HCC)   . COPD (chronic obstructive pulmonary disease) ([place])   . Diverticulosis of colon (without mention of hemorrhage)   . Esophageal reflux   . Family history of adverse reaction to anesthesia    daughter gets PONV  . Family hx of colon cancer   . History of stomach ulcers 1980s  . Hyperlipemia   . Hypertension   . 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 Myocardial infarction [facility])    "previous MI/echo in 12/2015"  . Nephrolithiasis    "got them now; never had OR/scopes" (02/03/2016)  . On home oxygen therapy    "3L-5L; 24/7" (12/29/2017)  . Other diseases of lung, not elsewhere classified   . Personal history of colonic polyps 04/26/2007   hyperplastic   . Pneumonia "several times"   Past Surgical History:  Procedure Laterality Date  . CARDIAC CATHETERIZATION  02/03/2016  . CARDIAC CATHETERIZATION  09/30/2009   non-obstructive CAD w/30% narrowing in prox LAD (Dr. Waunita Schooner)  . CARDIAC CATHETERIZATION  05/04/2001   same at 2011 cath (Dr. Waunita Schooner)  . CARDIAC  CATHETERIZATION N/A 02/03/2016   Procedure: Right/Left Heart Cath and Coronary Angiography;  Surgeon: Pixie Casino, MD;  Location: [place] [facility];  Service: Cardiovascular;  Laterality: N/A;  . EPIGLOTOPLASTY W/ MLB     removal due to carcinoma  . EXCISIONAL HEMORRHOIDECTOMY  2000s  . HAND SURGERY Right 1958   d/t crush injury  . TONSILLECTOMY AND ADENOIDECTOMY    . ULTRASOUND GUIDANCE FOR VASCULAR ACCESS  02/03/2016   Procedure: Ultrasound Guidance For Vascular Access;  Surgeon: Pixie Casino, MD;  Location: [place] [facility];  Service: Cardiovascular;;  . VASECTOMY     Current Outpatient Medications on File Prior to Visit  Medication Sig Dispense Refill  . albuterol (PROAIR HFA) 108 (90 Base) MCG/ACT inhaler INHALE 2 PUFFS EVERY 4 HOURS AS NEEDED FOR SHORTNESS OF BREATH. 2 Inhaler 5  . albuterol (PROVENTIL) (2.5 MG/3ML) 0.083% nebulizer solution Take 3 mLs (2.5 mg total) by nebulization every 4 (four) hours as needed for wheezing or shortness of breath. 75 mL 5  . ALPRAZolam (XANAX) 0.25 MG tablet Take 1 tablet (0.25 mg total) by mouth 2 (two) times daily as needed for anxiety. 20 tablet 0  . amiodarone (PACERONE) 200 MG tablet Take 0.5 tablets (100 mg total) by mouth daily. 15 tablet 11  . aspirin EC  81 MG tablet Take 81 mg by mouth daily.    Marland Kitchen azelastine (ASTELIN) 0.1 % nasal spray PLACE 2 SPRAYS INTO EACH NOSTRILS TWICE DAILY AS DIRECTED. 30 mL 5  . budesonide-formoterol (SYMBICORT) 160-4.5 MCG/ACT inhaler INHALE 2 PUFFS FIRST THING IN MORNING AND THEN ANOTHER 2 PUFFS ABOUT 12 HOURS LATER. 10.2 g 11  . Calcium Carbonate-Vitamin D (CALTRATE 600+D) 600-400 MG-UNIT tablet Take 1 tablet by mouth daily.     Marland Kitchen dextromethorphan-guaiFENesin (MUCINEX DM) 30-600 MG 12hr tablet Take 1 tablet by mouth 2 (two) times daily.    Marland Kitchen ENTRESTO 49-51 MG TAKE (1) TABLET BY MOUTH TWICE DAILY. 60 tablet 11  . HYDROcodone-homatropine (HYCODAN) 5-1.5 MG/5ML syrup Take 2.5 - 5 mLs PO Q 8 hours PRN for  severe coughing. DO NOT TAKE at the same time as XANAX. 120 mL 0  . LUTEIN PO Take 1 capsule by mouth daily.    . montelukast (SINGULAIR) 10 MG tablet TAKE 1 TABLET BY MOUTH DAILY. (Patient taking differently: TAKE 1 TABLET (10 MG TOTALLY) BY MOUTH DAILY.) 90 tablet 0  . Multiple Vitamins-Minerals (CENTRUM SILVER 50+MEN) TABS Take 1 tablet by mouth daily.    . OXYGEN Inhale 3 L into the lungs continuous. 3lpm with rest and 6 lpm with exertion    . pantoprazole (PROTONIX) 40 MG tablet TAKE ONE TABLET BY MOUTH DAILY. (Patient taking differently: TAKE ONE TABLET (40 MG TOTALLY) BY MOUTH DAILY.) 90 tablet 3  . polyethylene glycol (MIRALAX / GLYCOLAX) packet Take 17 g by mouth daily as needed for mild constipation. Mix in 8 oz liquid and drink    . spironolactone (ALDACTONE) 25 MG tablet TAKE (1/2) TABLET BY MOUTH DAILY. 16 tablet 3  . tamsulosin (FLOMAX) 0.4 MG CAPS capsule Take 1 capsule (0.4 mg total) by mouth daily. 30 capsule 3  . Tiotropium Bromide Monohydrate (SPIRIVA RESPIMAT) 2.5 MCG/ACT AERS Inhale 2 puffs into the lungs daily. 1 Inhaler 11   No current facility-administered medications on file prior to visit.    Allergies  Allergen Reactions  . Codeine Other (See Comments)    Thought head was going to blow off/ severe headache  . Adhesive [Tape] Other (See Comments)    Band-aids - tears skin off  . Gluten Meal Other (See Comments)    Celiac disease   Social History   Socioeconomic History  . Marital status: Married    Spouse name: Not on file  . Number of children: 3  . Years of education: Not on file  . Highest education level: Not on file  Occupational History  . 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 Financial resource strain: Not on file  . Food insecurity:    Worry: Not on file    Inability: Not on file  . Transportation needs:    Medical: Not on file    Non-medical: Not on file  Tobacco Use  . Smoking  status: Former Smoker    Packs/day: 2.00    Years: 35.00    Pack years: 70.00    Types: Cigarettes    Last attempt to quit: 01/21/1989    Years since quitting: 29.0  . Smokeless tobacco: Former Systems developer    Types: [place] date: 08/23/2001  Substance and Sexual Activity  . Alcohol use: No  . Drug use: No  . Sexual activity: Not on file  Lifestyle  . Physical activity:  Days per week: Not on file    Minutes per session: Not on file  . Stress: Not on file  Relationships  . Social connections:    Talks on phone: Not on file    Gets together: Not on file    Attends religious service: Not on file    Active member of club or organization: Not on file    Attends meetings of clubs or organizations: Not on file    Relationship status: Not on file  . Intimate partner violence:    Fear of current or ex partner: Not on file    Emotionally abused: Not on file    Physically abused: Not on file    Forced sexual activity: Not on file  Other Topics Concern  . Not on file  Social History Narrative  . Not on file      Review of Systems  All other systems reviewed and are negative.      Objective:   Physical Exam  HENT:  Right Ear: Tympanic membrane and ear canal normal.  Left Ear: Tympanic membrane and ear canal normal.  Nose: No mucosal edema or rhinorrhea.  Cardiovascular: Normal rate, regular rhythm and normal heart sounds.  No murmur heard. Pulmonary/Chest: He has decreased breath sounds. He has no wheezes. He has no rhonchi. He has no rales. He exhibits no tenderness.  Vitals reviewed.        Assessment & Plan:  Aspiration pneumonia of left lower lobe, unspecified aspiration pneumonia type ([place]) Recently saw my partner and was treated for aspiration pneumonia with Augmentin and later Levaquin was added given delayed recovery.  Patient states that he is doing much better today.  He is not yet back to his baseline however there are no appreciable rales or crackles on exam  and he is not wheezing today.  This is the best his lungs have sounded for me in several months.  Therefore I recommended tincture of time.  He could also add Zyrtec to help with postnasal drip and hopefully help alleviate some of the congestion.  Continue Mucinex.

## 2018-02-07 ENCOUNTER — Encounter (HOSPITAL_COMMUNITY): Payer: Self-pay

## 2018-02-07 ENCOUNTER — Ambulatory Visit: Payer: Medicare Other | Admitting: Family Medicine

## 2018-02-09 ENCOUNTER — Encounter (HOSPITAL_COMMUNITY): Payer: Self-pay

## 2018-02-14 ENCOUNTER — Encounter (HOSPITAL_COMMUNITY)
Admission: RE | Admit: 2018-02-14 | Discharge: 2018-02-14 | Disposition: A | Discharge status: Home / Self Care | Payer: Medicare Other | Source: Ambulatory Visit | Attending: Internal Medicine | Admitting: Internal Medicine

## 2018-02-16 ENCOUNTER — Encounter (HOSPITAL_COMMUNITY)
Admission: RE | Admit: 2018-02-16 | Discharge: 2018-02-16 | Disposition: A | Discharge status: Home / Self Care | Payer: Self-pay | Source: Ambulatory Visit | Attending: Internal Medicine | Admitting: Internal Medicine

## 2018-02-17 ENCOUNTER — Encounter: Payer: Self-pay | Admitting: Internal Medicine

## 2018-02-17 ENCOUNTER — Ambulatory Visit (INDEPENDENT_AMBULATORY_CARE_PROVIDER_SITE_OTHER)
Admission: RE | Admit: 2018-02-17 | Discharge: 2018-02-17 | Disposition: A | Discharge status: Home / Self Care | Payer: Medicare Other | Source: Ambulatory Visit | Attending: Internal Medicine | Admitting: Internal Medicine

## 2018-02-17 ENCOUNTER — Ambulatory Visit: Payer: Medicare Other | Admitting: Internal Medicine

## 2018-02-17 VITALS — BP 132/60 | HR 72 | Ht 66.5 in | Wt 126.8 lb

## 2018-02-17 DIAGNOSIS — J181 Lobar pneumonia, unspecified organism: Secondary | ICD-10-CM | POA: Diagnosis not present

## 2018-02-17 DIAGNOSIS — J9611 Chronic respiratory failure with hypoxia: Secondary | ICD-10-CM

## 2018-02-17 DIAGNOSIS — J189 Pneumonia, unspecified organism: Secondary | ICD-10-CM

## 2018-02-17 DIAGNOSIS — J449 Chronic obstructive pulmonary disease, unspecified: Secondary | ICD-10-CM

## 2018-02-17 NOTE — Progress Notes (Signed)
Subjective:    Patient ID: Bruce Travis, male    DOB: 1934-08-24    MRN: [id number]  Brief patient profile:   60   yowm son is MD/ quit smoking 1990 with GOLD II  COPD,  MPNs,  remote laryngeal cancer (? early 1990s)  with some dysphagia, allergic rhinitis, and esophageal reflux.    History of Present Illness  11/08/2011 Bruce Travis/ f/u ov on maint rx with symbicort and spiriva c/o nasal symptoms worse but  breathing fine, no limits as long as paces himself and no cough.  Main nasal issue is congestion/itching/sneezing worse x 2 weeks with purulent bloody secretions s HA/toothache  But ahs noted  year round nasal symptoms and worse x 2 years which do not correlate with breathing status Stopped nasonex due to insurance > no worse off it> has not f/u with Bruce Travis yet for ent re-eval No daytime saba need rec Augmentin 875 mg twice daily x 10days Prednisone 10 mg take  4 each am x 2 days,   2 each am x 2 days,  1 each am x2days and stop See Dr Bruce Travis in 2 weeks to let him direct you further on what to do about your chronic sinus symptoms      03/06/2013 f/u ov/Bruce Travis re GOLD II COPD/ chronic hoarse p surg ([facility])/ maint symb/ spiriva Chief Complaint  Patient presents with  . Follow-up    Pt states breathing is the same since last visit, no better or worse. Denies any new co's today.    main issue is yardwork in heat, bettter ex tol p uses saba, hfa good techniqe, never uses neb  rec Only use your albuterol (proaire) as a rescue medication Please see patient coordinator before you leave today  to schedule CT chest to compare to previous studies for your lung nodules> no change x 4 years so directed f/u indicated        04/12/2017  f/u ov/Bruce Travis re:  GOLD II copd / maint symb 160/spiriva dpi  Chief Complaint  Patient presents with  . Follow-up    Breathing is much improved since the last visit. He is attending pulmonary rehab 2 x per wk. He is using proair once per wk on average.   On 3lpm most the  time/ 6lpm with exertion like at rehab, doing about the same vs a year prior to OV   Back to doing some walking at [facility] behind a cart with 02 in the cart  Using proair first thing in am ? Why as supposed to use just prn  No worse sob off prednisone  rec Try spiriva respimat 2 pffs each am right behind the symbicort      07/11/2017  f/u ov/Bruce Travis re: copd II/ maint symb and spiriva smi Chief Complaint  Patient presents with  . Follow-up    Breathing is doing well. He uses his proair 1 x per wk on average.   maint rehab going well  Walks at lowe's slow pace @ 6lpm / 3lpm at rest and sleep and sats staying 90 or better most of the time  rec no change rx    Admit date: 12/29/2017 Discharge date: 12/31/2017  Following problems were addressed during his hospitalization:  Acute on chronic respiratory failure with hypoxia: Secondary to COPD and pneumonia. Continue supplemental oxygen at home, bronchodilators, steroids, antibiotics.  Community-acquired pneumonia:Chest x-ray showed bilateral pneumonia.Patient failed outpatient oral antibiotic. Started on azithromycin and ceftriaxone here.  Antibiotics changed to oral on discharge Negative  blood cultures, sputum cultures so far.Urine Streptococcalantigen negative.  COPD: Follows with pulmonology as an outpatient. On nebulization machine, bronchodilators at home.On 3 L oxygen at rest.  GERD: Continue Protonix  Hypertension: Currently blood pressure stable.Continue home meds.  Coronary artery disease with ischemic cardiomyopathy: Currently stable. No chest pain. Continue aspirin, Entresto, amiodarone  Chronic kidney disease stage III: Currently creatinine on baseline  History of WLN:[id number] Flomax  Lactic acidosis: Improved.  Diastolic CHF: 2D echo on 17/40 showed EF 55 to 60%.Currently he is euvolemic. No peripheral edema. Continue Entresto. Spironolactone on hold. Not on beta-blocker therapy due to severe  COPD.  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 Follows with cardiology   Discharge Diagnoses:  Principal Problem:   Acute respiratory failure with hypoxia ([place]) Active Problems:   GERD   COPD exacerbation ([place])   Hypertension   Community acquired pneumonia of left lower lobe of lung ([place])   CAD (coronary artery disease)   Chronic kidney disease (CKD), stage III (moderate) ([place])     02/17/2018  Ext post hosp f/u ov/Bruce Travis re:  Copd GOLD II/ 02 dep transition of care s/p above admit  Chief Complaint  Patient presents with  . Follow-up    Breathing is unchanged. He rarely uses albuterol inhaler or neb. He sta  Dyspnea:  On 3-4lpm can do any store = [employer] = can't walk a nl pace on a flat grade s sob but does fine slow and flat  Cough: min sporadic white mucus  SABA use: rarely hfa/ no neb    No obvious day to day or daytime variability or assoc excess/ purulent sputum or mucus plugs or hemoptysis or cp or chest tightness, subjective wheeze or overt sinus or hb symptoms.   Sleeping: lying flat on 3lpm ok without nocturnal  or early am exacerbation  of respiratory  c/o's or need for noct saba. Also denies any obvious fluctuation of symptoms with weather or environmental changes or other aggravating or alleviating factors except as outlined above   No unusual exposure hx or h/o childhood pna/ asthma or knowledge of premature birth.  Current Allergies, Complete Past Medical History, Past Surgical History, Family History, and Social History were reviewed in [employer] record.  ROS  The following are not active complaints unless bolded Hoarseness, sore throat, dysphagia, dental problems, itching, sneezing,  nasal congestion or discharge of excess mucus or purulent secretions, ear ache,   fever, chills, sweats, unintended wt loss or wt gain, classically pleuritic or exertional cp,  orthopnea pnd or arm/hand swelling  or leg swelling, presyncope, palpitations,  abdominal pain, anorexia, nausea, vomiting, diarrhea  or change in bowel habits or change in bladder habits, change in stools or change in urine, dysuria, hematuria,  rash, arthralgias, visual complaints, headache, numbness, weakness or ataxia or problems with walking or coordination,  change in mood or  memory.        Current Meds  Medication Sig  . albuterol (PROAIR HFA) 108 (90 Base) MCG/ACT inhaler INHALE 2 PUFFS EVERY 4 HOURS AS NEEDED FOR SHORTNESS OF BREATH.  Marland Kitchen albuterol (PROVENTIL) (2.5 MG/3ML) 0.083% nebulizer solution Take 3 mLs (2.5 mg total) by nebulization every 4 (four) hours as needed for wheezing or shortness of breath.  . ALPRAZolam (XANAX) 0.25 MG tablet Take 1 tablet (0.25 mg total) by mouth 2 (two) times daily as needed for anxiety.  Marland Kitchen amiodarone (PACERONE) 200 MG tablet Take 0.5 tablets (100 mg total) by mouth daily.  Marland Kitchen aspirin EC 81 MG tablet Take  81 mg by mouth daily.  Marland Kitchen azelastine (ASTELIN) 0.1 % nasal spray PLACE 2 SPRAYS INTO EACH NOSTRILS TWICE DAILY AS DIRECTED.  Marland Kitchen budesonide-formoterol (SYMBICORT) 160-4.5 MCG/ACT inhaler INHALE 2 PUFFS FIRST THING IN MORNING AND THEN ANOTHER 2 PUFFS ABOUT 12 HOURS LATER.  . Calcium Carbonate-Vitamin D (CALTRATE 600+D) 600-400 MG-UNIT tablet Take 1 tablet by mouth daily.   Marland Kitchen dextromethorphan-guaiFENesin (MUCINEX DM) 30-600 MG 12hr tablet Take 1 tablet by mouth 2 (two) times daily.  Marland Kitchen ENTRESTO 49-51 MG TAKE (1) TABLET BY MOUTH TWICE DAILY.  Marland Kitchen HYDROcodone-homatropine (HYCODAN) 5-1.5 MG/5ML syrup Take 2.5 - 5 mLs PO Q 8 hours PRN for severe coughing. DO NOT TAKE at the same time as XANAX.  Marland Kitchen LUTEIN PO Take 1 capsule by mouth daily.  . montelukast (SINGULAIR) 10 MG tablet TAKE 1 TABLET BY MOUTH DAILY. (Patient taking differently: TAKE 1 TABLET (10 MG TOTALLY) BY MOUTH DAILY.)  . Multiple Vitamins-Minerals (CENTRUM SILVER 50+MEN) TABS Take 1 tablet by mouth daily.  . OXYGEN Inhale 3 L into the lungs continuous. 3lpm with rest and 6 lpm with  exertion  . pantoprazole (PROTONIX) 40 MG tablet TAKE ONE TABLET BY MOUTH DAILY. (Patient taking differently: TAKE ONE TABLET (40 MG TOTALLY) BY MOUTH DAILY.)  . polyethylene glycol (MIRALAX / GLYCOLAX) packet Take 17 g by mouth daily as needed for mild constipation. Mix in 8 oz liquid and drink  . predniSONE (DELTASONE) 10 MG tablet Take 10 mg by mouth daily with breakfast.  . spironolactone (ALDACTONE) 25 MG tablet TAKE (1/2) TABLET BY MOUTH DAILY.  . tamsulosin (FLOMAX) 0.4 MG CAPS capsule Take 1 capsule (0.4 mg total) by mouth daily.  . Tiotropium Bromide Monohydrate (SPIRIVA RESPIMAT) 2.5 MCG/ACT AERS Inhale 2 puffs into the lungs daily.                      Objective:   Physical Exam  amb hoarse wm   Vital signs reviewed - Note on arrival 02 sats  92% on 6lpm     Wt  140 04/08/2011 > 06/08/2011 141 >136 09/09/2011 > 141 11/08/2011 > 03/07/2012  139 > 09/07/2012  141 > 136 03/06/2013 > 03/01/2017   128 >  04/12/2017  125 > 07/11/2017   128 > 11/15/2017  129 > 02/17/2018   126      HEENT:  Nl oropharynx. Nl external ear canals without cough reflex - mild bilateral non-specific turbinate edema  / edentulous    NECK :  without JVD/Nodes/TM/ nl carotid upstrokes bilaterally   LUNGS: no acc muscle use,  Mod barrel  contour chest wall with bilateral  Distant bs s audible wheeze and  without cough on insp or exp maneuver and mod   Hyperresonant  to  percussion bilaterally     CV:  RRR  no s3 or murmur or increase in P2, and no edema   ABD:  soft and nontender with pos mid insp Hoover's  in the supine position. No bruits or organomegaly appreciated, bowel sounds nl  MS:   Nl gait/  ext warm without deformities, calf tenderness, cyanosis or clubbing No obvious joint restrictions   SKIN: warm and dry without lesions    NEURO:  alert, approp, nl sensorium with  no motor or cerebellar deficits apparent.                   CXR PA and Lateral:   02/17/2018 :    I personally  reviewed images  and agree with radiology impression as follows:    Resolved left lower lobe pneumonia.    Assessment & Plan:

## 2018-02-17 NOTE — Patient Instructions (Signed)
No change in medications  Please remember to go to the  x-ray department downstairs in the basement  for your tests - we will call you with the results when they are available.      Please schedule a follow up visit in 3 months but call sooner if needed

## 2018-02-19 ENCOUNTER — Encounter: Payer: Self-pay | Admitting: Internal Medicine

## 2018-02-19 NOTE — Assessment & Plan Note (Addendum)
-   PFT's 06/08/11  FEV1  1.71 (67%) ratio 37 and no better p B2 with DLC0 54% -  PFT's  04/12/2017  FEV1 1.62  (65 % ) ratio 43  p 6 % improvement from saba p spiriva  prior to study with DLCO  24/23 % corrects to 27  % for alv volume   - 04/12/2017  After extensive coaching device effectiveness =    75% so changed to spiriva smi     - 11/15/2017  After extensive coaching inhaler device  effectiveness =    90%     Group D in terms of symptom/risk and laba/lama/ICS  therefore appropriate rx at this point > continue present rx with symb/spiriva   I had an extended discussion with the patient reviewing all relevant studies completed to date and  lasting 15 to 20 minutes of a 25 minute extended post hosp f/u office visit    Each maintenance medication was reviewed in detail including most importantly the difference between maintenance and prns and under what circumstances the prns are to be triggered using an action plan format that is not reflected in the computer generated alphabetically organized AVS.    Please see AVS for specific instructions unique to this visit that I personally wrote and verbalized to the the pt in detail and then reviewed with pt  by my nurse highlighting any  changes in therapy recommended at today's visit to their plan of care.

## 2018-02-19 NOTE — Assessment & Plan Note (Signed)
Adequate control on present rx, reviewed in detail with pt > no change in rx needed  = 3lpm rest/sleep and up to 6lpm with activity   Concerned about risk of amiodarone toxicity given his age, cumulative dose and co-existing lung dz (3/4 risk factors) . Discussed in detail all the  indications, usual  risks and alternatives  relative to the benefits with patient who agrees to proceed with conservative f/u with low threshold to d/c amiodarone for unexplained desats.

## 2018-02-19 NOTE — Assessment & Plan Note (Signed)
Resolved clinically and radiographically s evidence of amiodarone toxicity > no f/u needed

## 2018-02-20 ENCOUNTER — Telehealth: Payer: Self-pay | Admitting: Internal Medicine

## 2018-02-20 NOTE — Progress Notes (Signed)
LMTCB

## 2018-02-20 NOTE — Telephone Encounter (Signed)
Notes recorded by Tanda Rockers, MD on 02/17/2018 at 5:15 PM EDT Call pt: Reviewed cxr and no acute change so no change in recommendations made at ov  Pt is aware of results and voiced his understanding.  Nothing further is needed.

## 2018-02-21 ENCOUNTER — Encounter (HOSPITAL_COMMUNITY)
Admission: RE | Admit: 2018-02-21 | Discharge: 2018-02-21 | Disposition: A | Discharge status: Home / Self Care | Payer: Self-pay | Source: Ambulatory Visit | Attending: Internal Medicine | Admitting: Internal Medicine

## 2018-02-21 DIAGNOSIS — I5021 Acute systolic (congestive) heart failure: Secondary | ICD-10-CM | POA: Insufficient documentation

## 2018-02-28 ENCOUNTER — Encounter (HOSPITAL_COMMUNITY)
Admission: RE | Admit: 2018-02-28 | Discharge: 2018-02-28 | Disposition: A | Discharge status: Home / Self Care | Payer: Self-pay | Source: Ambulatory Visit | Attending: Internal Medicine | Admitting: Internal Medicine

## 2018-03-02 ENCOUNTER — Encounter (HOSPITAL_COMMUNITY)
Admission: RE | Admit: 2018-03-02 | Discharge: 2018-03-02 | Disposition: A | Discharge status: Home / Self Care | Payer: Self-pay | Source: Ambulatory Visit | Attending: Internal Medicine | Admitting: Internal Medicine

## 2018-03-03 ENCOUNTER — Other Ambulatory Visit (HOSPITAL_COMMUNITY): Payer: Self-pay | Admitting: Internal Medicine

## 2018-03-07 ENCOUNTER — Encounter (HOSPITAL_COMMUNITY): Payer: Self-pay

## 2018-03-09 ENCOUNTER — Encounter (HOSPITAL_COMMUNITY): Payer: Self-pay

## 2018-03-10 ENCOUNTER — Telehealth: Payer: Self-pay | Admitting: Internal Medicine

## 2018-03-10 NOTE — Telephone Encounter (Signed)
Returned call to [employer].  She needed directions for Spiriva refill.  She was unable to reach Patients pharmacy.  Prescription, directions, given. Nothing further at this time.

## 2018-03-14 ENCOUNTER — Encounter (HOSPITAL_COMMUNITY)
Admission: RE | Admit: 2018-03-14 | Discharge: 2018-03-14 | Disposition: A | Discharge status: Home / Self Care | Payer: Self-pay | Source: Ambulatory Visit | Attending: Internal Medicine | Admitting: Internal Medicine

## 2018-03-16 ENCOUNTER — Encounter (HOSPITAL_COMMUNITY)
Admission: RE | Admit: 2018-03-16 | Discharge: 2018-03-16 | Disposition: A | Discharge status: Home / Self Care | Payer: Self-pay | Source: Ambulatory Visit | Attending: Internal Medicine | Admitting: Internal Medicine

## 2018-03-21 ENCOUNTER — Encounter (HOSPITAL_COMMUNITY)
Admission: RE | Admit: 2018-03-21 | Discharge: 2018-03-21 | Disposition: A | Discharge status: Home / Self Care | Payer: Self-pay | Source: Ambulatory Visit | Attending: Internal Medicine | Admitting: Internal Medicine

## 2018-03-23 ENCOUNTER — Encounter (HOSPITAL_COMMUNITY): Payer: Self-pay | Attending: Internal Medicine

## 2018-03-23 DIAGNOSIS — I5021 Acute systolic (congestive) heart failure: Secondary | ICD-10-CM | POA: Insufficient documentation

## 2018-03-25 ENCOUNTER — Encounter (HOSPITAL_COMMUNITY): Payer: Self-pay | Admitting: *Deleted

## 2018-03-25 NOTE — Progress Notes (Signed)
Bruce Travis has decided to drop from the Pulmonary Maintenance program for two months. He states that he is going to be taking several trips over the next two months, but he plans to rejoin after that. I have encouraged him to rejoin as soon as he is back in town.

## 2018-03-28 ENCOUNTER — Encounter (HOSPITAL_COMMUNITY): Payer: Self-pay

## 2018-03-29 ENCOUNTER — Other Ambulatory Visit: Payer: Self-pay | Admitting: Family Medicine

## 2018-03-29 NOTE — Telephone Encounter (Signed)
Pt is requesting refill on Xanax   LOV: 02/03/18  LRF:   11/17/17

## 2018-03-30 ENCOUNTER — Encounter (HOSPITAL_COMMUNITY): Payer: Self-pay

## 2018-03-30 MED ORDER — ALPRAZOLAM 0.25 MG PO TABS: 0.2500 mg | ORAL_TABLET | Freq: Two times a day (BID) | ORAL | 0 refills | Status: DC | PRN

## 2018-04-04 ENCOUNTER — Encounter (HOSPITAL_COMMUNITY): Payer: Self-pay

## 2018-04-05 ENCOUNTER — Other Ambulatory Visit: Payer: Self-pay | Admitting: Family Medicine

## 2018-04-05 DIAGNOSIS — N410 Acute prostatitis: Secondary | ICD-10-CM

## 2018-04-06 ENCOUNTER — Encounter (HOSPITAL_COMMUNITY): Payer: Self-pay

## 2018-04-11 ENCOUNTER — Encounter (HOSPITAL_COMMUNITY): Payer: Self-pay

## 2018-04-13 ENCOUNTER — Encounter (HOSPITAL_COMMUNITY): Payer: Self-pay

## 2018-04-18 ENCOUNTER — Encounter (HOSPITAL_COMMUNITY): Payer: Self-pay

## 2018-04-20 ENCOUNTER — Encounter (HOSPITAL_COMMUNITY): Payer: Self-pay

## 2018-04-25 ENCOUNTER — Other Ambulatory Visit: Payer: Medicare Other

## 2018-04-25 DIAGNOSIS — N183 Chronic kidney disease, stage 3 unspecified: Secondary | ICD-10-CM

## 2018-04-25 DIAGNOSIS — I10 Essential (primary) hypertension: Secondary | ICD-10-CM

## 2018-04-25 DIAGNOSIS — E785 Hyperlipidemia, unspecified: Secondary | ICD-10-CM

## 2018-04-25 LAB — CBC WITH DIFFERENTIAL/PLATELET
Basophils Absolute: 53 cells/uL (ref 0–200)
Basophils Relative: 0.7 %
EOS PCT: 1.6 %
Eosinophils Absolute: 122 cells/uL (ref 15–500)
HEMATOCRIT: 46.8 % (ref 38.5–50.0)
Hemoglobin: 16 g/dL (ref 13.2–17.1)
Lymphs Abs: 1984 cells/uL (ref 850–3900)
MCH: 33.8 pg — ABNORMAL HIGH (ref 27.0–33.0)
MCHC: 34.2 g/dL (ref 32.0–36.0)
MCV: 98.9 fL (ref 80.0–100.0)
MONOS PCT: 9.8 %
MPV: 10.8 fL (ref 7.5–12.5)
NEUTROS PCT: 61.8 %
Neutro Abs: 4697 cells/uL (ref 1500–7800)
PLATELETS: 186 10*3/uL (ref 140–400)
RBC: 4.73 10*6/uL (ref 4.20–5.80)
RDW: 12.3 % (ref 11.0–15.0)
TOTAL LYMPHOCYTE: 26.1 %
WBC mixed population: 745 cells/uL (ref 200–950)
WBC: 7.6 10*3/uL (ref 3.8–10.8)

## 2018-04-25 LAB — LIPID PANEL
CHOL/HDL RATIO: 4.7 (calc) (ref <5.0)
Cholesterol: 213 mg/dL — ABNORMAL HIGH (ref <200)
HDL: 45 mg/dL (ref >40)
LDL CHOLESTEROL (CALC): 137 mg/dL — AB
Non-HDL Cholesterol (Calc): 168 mg/dL (calc) — ABNORMAL HIGH (ref <130)
TRIGLYCERIDES: 177 mg/dL — AB (ref <150)

## 2018-04-25 LAB — COMPLETE METABOLIC PANEL WITH GFR
AG Ratio: 2.3 (calc) (ref 1.0–2.5)
ALKALINE PHOSPHATASE (APISO): 51 U/L (ref 40–115)
ALT: 18 U/L (ref 9–46)
AST: 12 U/L (ref 10–35)
Albumin: 3.9 g/dL (ref 3.6–5.1)
BUN/Creatinine Ratio: 16 (calc) (ref 6–22)
BUN: 20 mg/dL (ref 7–25)
CALCIUM: 9.4 mg/dL (ref 8.6–10.3)
CO2: 31 mmol/L (ref 20–32)
CREATININE: 1.23 mg/dL — AB (ref 0.70–1.11)
Chloride: 108 mmol/L (ref 98–110)
GFR, EST NON AFRICAN AMERICAN: 55 mL/min/{1.73_m2} — AB (ref >=60)
GFR, Est African American: 63 mL/min/{1.73_m2} (ref >=60)
GLOBULIN: 1.7 g/dL — AB (ref 1.9–3.7)
Glucose, Bld: 87 mg/dL (ref 65–99)
Potassium: 4.5 mmol/L (ref 3.5–5.3)
Sodium: 143 mmol/L (ref 135–146)
Total Bilirubin: 0.4 mg/dL (ref 0.2–1.2)
Total Protein: 5.6 g/dL — ABNORMAL LOW (ref 6.1–8.1)

## 2018-04-27 ENCOUNTER — Ambulatory Visit (INDEPENDENT_AMBULATORY_CARE_PROVIDER_SITE_OTHER): Payer: Medicare Other | Admitting: Family Medicine

## 2018-04-27 ENCOUNTER — Encounter: Payer: Self-pay | Admitting: Family Medicine

## 2018-04-27 VITALS — BP 150/70 | HR 74 | Temp 97.4°F | Resp 22 | Ht 66.5 in | Wt 130.0 lb

## 2018-04-27 DIAGNOSIS — N183 Chronic kidney disease, stage 3 unspecified: Secondary | ICD-10-CM

## 2018-04-27 DIAGNOSIS — I2510 Atherosclerotic heart disease of native coronary artery without angina pectoris: Secondary | ICD-10-CM

## 2018-04-27 DIAGNOSIS — I5040 Unspecified combined systolic (congestive) and diastolic (congestive) heart failure: Secondary | ICD-10-CM | POA: Diagnosis not present

## 2018-04-27 DIAGNOSIS — Z0000 Encounter for general adult medical examination without abnormal findings: Secondary | ICD-10-CM | POA: Diagnosis not present

## 2018-04-27 DIAGNOSIS — J441 Chronic obstructive pulmonary disease with (acute) exacerbation: Secondary | ICD-10-CM

## 2018-04-27 DIAGNOSIS — I252 Old myocardial infarction: Secondary | ICD-10-CM | POA: Diagnosis not present

## 2018-04-27 MED ORDER — PREDNISONE 5 MG PO TABS: 5.0000 mg | ORAL_TABLET | Freq: Every day | ORAL | 5 refills | Status: DC

## 2018-04-27 MED ORDER — ATORVASTATIN CALCIUM 20 MG PO TABS: 20.0000 mg | ORAL_TABLET | Freq: Every day | ORAL | 3 refills | Status: DC

## 2018-04-27 NOTE — Progress Notes (Signed)
Subjective:    Patient ID: Bruce Travis, male    DOB: 1935/08/07, 83 y.o.   MRN: [id number]  HPI  Patient is here for CPE.  He has CHF due to frequent PVCs. He was found to have an ejection fraction of 20-25%. Cardiac catheterization revealed minimal coronary artery disease that was noncontributory. It appears that it was attributed to his frequent PVCs. This is why he takes amiodarone.  Patient has been doing relatively well recently.  His breathing is back to his baseline although he is still taking 10 mg a day of prednisone.  He has not weaned down as we previously have discussed.  He is due for a flu shot.  Due to his advanced age, I have recommended against any prostate cancer screening or colon cancer screening.  The remainder of his immunizations are listed below and are up-to-date.  His most recent lab work is significant for mild stage III chronic kidney disease as well as elevated LDL cholesterol in a patient with a history of coronary artery disease. Immunization History  Administered Date(s) Administered  . H1N1 07/30/2008  . Influenza Split 05/24/2011  . Influenza Whole 06/23/2009, 06/02/2010, 05/23/2012  . Influenza, High Dose Seasonal PF 05/23/2017  . Influenza,inj,Quad PF,6+ Mos 05/11/2013, 05/21/2014, 05/12/2015, 05/19/2016  . Pneumococcal Conjugate-13 08/14/2013  . Pneumococcal Polysaccharide-23 10/28/2014  . Td 03/20/2009  . Tdap 04/17/2012    Lab on 04/25/2018  Component Date Value Ref Range Status  . WBC 04/25/2018 7.6  3.8 - 10.8 Thousand/uL Final  . RBC 04/25/2018 4.73  4.20 - 5.80 Million/uL Final  . Hemoglobin 04/25/2018 16.0  13.2 - 17.1 g/dL Final  . HCT 04/25/2018 46.8  38.5 - 50.0 % Final  . MCV 04/25/2018 98.9  80.0 - 100.0 fL Final  . MCH 04/25/2018 33.8* 27.0 - 33.0 pg Final  . MCHC 04/25/2018 34.2  32.0 - 36.0 g/dL Final  . RDW 04/25/2018 12.3  11.0 - 15.0 % Final  . Platelets 04/25/2018 186  140 - 400 Thousand/uL Final  . MPV 04/25/2018 10.8  7.5 - 12.5  fL Final  . Neutro Abs 04/25/2018 4,697  1,500 - 7,800 cells/uL Final  . Lymphs Abs 04/25/2018 1,984  850 - 3,900 cells/uL Final  . WBC mixed population 04/25/2018 745  200 - 950 cells/uL Final  . Eosinophils Absolute 04/25/2018 122  15 - 500 cells/uL Final  . Basophils Absolute 04/25/2018 53  0 - 200 cells/uL Final  . Neutrophils Relative % 04/25/2018 61.8  % Final  . Total Lymphocyte 04/25/2018 26.1  % Final  . Monocytes Relative 04/25/2018 9.8  % Final  . Eosinophils Relative 04/25/2018 1.6  % Final  . Basophils Relative 04/25/2018 0.7  % Final  . Glucose, Bld 04/25/2018 87  65 - 99 mg/dL Final   Comment: .            Fasting reference interval .   . BUN 04/25/2018 20  7 - 25 mg/dL Final  . Creat 04/25/2018 1.23* 0.70 - 1.11 mg/dL Final   Comment: For patients >76 years of age, the reference limit for Creatinine is approximately 13% higher for people identified as African-American. .   . GFR, Est Non African American 04/25/2018 55* > OR = 60 mL/min/1.5m Final  . GFR, Est African American 04/25/2018 63  > OR = 60 mL/min/1.718mFinal  . BUN/Creatinine Ratio 04/25/2018 16  6 - 22 (calc) Final  . Sodium 04/25/2018 143  135 - 146 mmol/L Final  .  Potassium 04/25/2018 4.5  3.5 - 5.3 mmol/L Final  . Chloride 04/25/2018 108  98 - 110 mmol/L Final  . CO2 04/25/2018 31  20 - 32 mmol/L Final  . Calcium 04/25/2018 9.4  8.6 - 10.3 mg/dL Final  . Total Protein 04/25/2018 5.6* 6.1 - 8.1 g/dL Final  . Albumin 04/25/2018 3.9  3.6 - 5.1 g/dL Final  . Globulin 04/25/2018 1.7* 1.9 - 3.7 g/dL (calc) Final  . AG Ratio 04/25/2018 2.3  1.0 - 2.5 (calc) Final  . Total Bilirubin 04/25/2018 0.4  0.2 - 1.2 mg/dL Final  . Alkaline phosphatase (APISO) 04/25/2018 51  40 - 115 U/L Final  . AST 04/25/2018 12  10 - 35 U/L Final  . ALT 04/25/2018 18  9 - 46 U/L Final  . Cholesterol 04/25/2018 213* <200 mg/dL Final  . HDL 04/25/2018 45  >40 mg/dL Final  . Triglycerides 04/25/2018 177* <150 mg/dL Final  .  LDL Cholesterol (Calc) 04/25/2018 137* mg/dL (calc) Final   Comment: Reference range: <100 . Desirable range <100 mg/dL for primary prevention;   <70 mg/dL for patients with CHD or diabetic patients  with > or = 2 CHD risk factors. Marland Kitchen LDL-C is now calculated using the Martin-Hopkins  calculation, which is a validated novel method providing  better accuracy than the Friedewald equation in the  estimation of LDL-C.  Cresenciano Genre et al. Annamaria Helling. 9030;092(33): 2061-2068  (http://education.[employer].com/faq/FAQ164)   . Total CHOL/HDL Ratio 04/25/2018 4.7  <5.0 (calc) Final  . Non-HDL Cholesterol (Calc) 04/25/2018 168* <130 mg/dL (calc) Final   Comment: For patients with diabetes plus 1 major ASCVD risk  factor, treating to a non-HDL-C goal of <100 mg/dL  (LDL-C of <70 mg/dL) is considered a therapeutic  option.    Past Medical History:  Diagnosis Date  . Allergic rhinitis   . Anxiety   . Bronchiectasis   . CAD (coronary artery disease)   . Celiac disease   . Chronic heart failure ([place])   . Chronic kidney disease (CKD), stage III (moderate) (HCC)   . COPD (chronic obstructive pulmonary disease) ([place])   . Diverticulosis of colon (without mention of hemorrhage)   . Esophageal reflux   . Family history of adverse reaction to anesthesia    daughter gets PONV  . Family hx of colon cancer   . History of stomach ulcers 1980s  . Hyperlipemia   . Hypertension   . 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 Myocardial infarction [facility])    "previous MI/echo in 12/2015"  . Nephrolithiasis    "got them now; never had OR/scopes" (02/03/2016)  . On home oxygen therapy    "3L-5L; 24/7" (12/29/2017)  . Other diseases of lung, not elsewhere classified   . Personal history of colonic polyps 04/26/2007   hyperplastic   . Pneumonia "several times"   Past Surgical History:  Procedure Laterality Date  . CARDIAC CATHETERIZATION  02/03/2016  . CARDIAC CATHETERIZATION   09/30/2009   non-obstructive CAD w/30% narrowing in prox LAD (Dr. Waunita Schooner)  . CARDIAC CATHETERIZATION  05/04/2001   same at 2011 cath (Dr. Waunita Schooner)  . CARDIAC CATHETERIZATION N/A 02/03/2016   Procedure: Right/Left Heart Cath and Coronary Angiography;  Surgeon: Pixie Casino, MD;  Location: [facility];  Service: Cardiovascular;  Laterality: N/A;  . EPIGLOTOPLASTY W/ MLB     removal due to carcinoma  . EXCISIONAL HEMORRHOIDECTOMY  2000s  . HAND SURGERY Right 1958  d/t crush injury  . TONSILLECTOMY AND ADENOIDECTOMY    . ULTRASOUND GUIDANCE FOR VASCULAR ACCESS  02/03/2016   Procedure: Ultrasound Guidance For Vascular Access;  Surgeon: Pixie Casino, MD;  Location: [facility];  Service: Cardiovascular;;  . VASECTOMY     Current Outpatient Medications on File Prior to Visit  Medication Sig Dispense Refill  . albuterol (PROAIR HFA) 108 (90 Base) MCG/ACT inhaler INHALE 2 PUFFS EVERY 4 HOURS AS NEEDED FOR SHORTNESS OF BREATH. 2 Inhaler 5  . albuterol (PROVENTIL) (2.5 MG/3ML) 0.083% nebulizer solution Take 3 mLs (2.5 mg total) by nebulization every 4 (four) hours as needed for wheezing or shortness of breath. 75 mL 5  . ALPRAZolam (XANAX) 0.25 MG tablet Take 1 tablet (0.25 mg total) by mouth 2 (two) times daily as needed for anxiety. 20 tablet 0  . amiodarone (PACERONE) 200 MG tablet TAKE 1/2 TABLET BY MOUTH ONCE DAILY. 15 tablet 5  . aspirin EC 81 MG tablet Take 81 mg by mouth daily.    Marland Kitchen azelastine (ASTELIN) 0.1 % nasal spray PLACE 2 SPRAYS INTO EACH NOSTRILS TWICE DAILY AS DIRECTED. 30 mL 5  . budesonide-formoterol (SYMBICORT) 160-4.5 MCG/ACT inhaler INHALE 2 PUFFS FIRST THING IN MORNING AND THEN ANOTHER 2 PUFFS ABOUT 12 HOURS LATER. 10.2 g 11  . Calcium Carbonate-Vitamin D (CALTRATE 600+D) 600-400 MG-UNIT tablet Take 1 tablet by mouth daily.     Marland Kitchen dextromethorphan-guaiFENesin (MUCINEX DM) 30-600 MG 12hr tablet Take 1 tablet by mouth 2 (two) times daily.    Marland Kitchen ENTRESTO  49-51 MG TAKE (1) TABLET BY MOUTH TWICE DAILY. 60 tablet 11  . HYDROcodone-homatropine (HYCODAN) 5-1.5 MG/5ML syrup Take 2.5 - 5 mLs PO Q 8 hours PRN for severe coughing. DO NOT TAKE at the same time as XANAX. 120 mL 0  . LUTEIN PO Take 1 capsule by mouth daily.    . montelukast (SINGULAIR) 10 MG tablet TAKE 1 TABLET BY MOUTH DAILY. (Patient taking differently: TAKE 1 TABLET (10 MG TOTALLY) BY MOUTH DAILY.) 90 tablet 0  . Multiple Vitamins-Minerals (CENTRUM SILVER 50+MEN) TABS Take 1 tablet by mouth daily.    . OXYGEN Inhale 3 L into the lungs continuous. 3lpm with rest and 6 lpm with exertion    . pantoprazole (PROTONIX) 40 MG tablet TAKE ONE TABLET BY MOUTH DAILY. (Patient taking differently: TAKE ONE TABLET (40 MG TOTALLY) BY MOUTH DAILY.) 90 tablet 3  . polyethylene glycol (MIRALAX / GLYCOLAX) packet Take 17 g by mouth daily as needed for mild constipation. Mix in 8 oz liquid and drink    . predniSONE (DELTASONE) 10 MG tablet Take 10 mg by mouth daily with breakfast.    . spironolactone (ALDACTONE) 25 MG tablet TAKE (1/2) TABLET BY MOUTH DAILY. 16 tablet 3  . tamsulosin (FLOMAX) 0.4 MG CAPS capsule TAKE ONE CAPSULE BY MOUTH ONCE DAILY. 90 capsule 3  . Tiotropium Bromide Monohydrate (SPIRIVA RESPIMAT) 2.5 MCG/ACT AERS Inhale 2 puffs into the lungs daily. 1 Inhaler 11   No current facility-administered medications on file prior to visit.    Allergies  Allergen Reactions  . Codeine Other (See Comments)    Thought head was going to blow off/ severe headache  . Adhesive [Tape] Other (See Comments)    Band-aids - tears skin off  . Gluten Meal Other (See Comments)    Celiac disease   Social History   Socioeconomic History  . Marital status: Married    Spouse name: Not on file  .  Number of children: 3  . Years of education: Not on file  . Highest education level: Not on file  Occupational History  . Occupation: retired    Fish farm manager: RETIRED    Comment: farmer and truck Psychologist, prison and probation services  . Financial resource strain: Not on file  . Food insecurity:    Worry: Not on file    Inability: Not on file  . Transportation needs:    Medical: Not on file    Non-medical: Not on file  Tobacco Use  . Smoking status: Former Smoker    Packs/day: 2.00    Years: 35.00    Pack years: 70.00    Types: Cigarettes    Last attempt to quit: 01/21/1989    Years since quitting: 29.2  . Smokeless tobacco: Former Systems developer    Types: [place] date: 08/23/2001  Substance and Sexual Activity  . Alcohol use: No  . Drug use: No  . Sexual activity: Not on file  Lifestyle  . Physical activity:    Days per week: Not on file    Minutes per session: Not on file  . Stress: Not on file  Relationships  . Social connections:    Talks on phone: Not on file    Gets together: Not on file    Attends religious service: Not on file    Active member of club or organization: Not on file    Attends meetings of clubs or organizations: Not on file    Relationship status: Not on file  . Intimate partner violence:    Fear of current or ex partner: Not on file    Emotionally abused: Not on file    Physically abused: Not on file    Forced sexual activity: Not on file  Other Topics Concern  . Not on file  Social History Narrative  . Not on file   Family History  Problem Relation Age of Onset  . Heart disease Father   . Colon cancer Father   . Prostate cancer Father   . Stroke Mother   . Endometrial cancer Sister   . Stroke Maternal Grandmother   . Cancer Paternal Grandmother      Review of Systems  All other systems reviewed and are negative.      Objective:   Physical Exam  Constitutional: He is oriented to person, place, and time. He appears well-developed and well-nourished. No distress.  HENT:  Head: Normocephalic and atraumatic.  Right Ear: External ear normal.  Left Ear: External ear normal.  Nose: Nose normal.  Mouth/Throat: Oropharynx is clear and moist. No oropharyngeal  exudate.  Eyes: Pupils are equal, round, and reactive to light. Conjunctivae and EOM are normal. Right eye exhibits no discharge. Left eye exhibits no discharge. No scleral icterus.  Neck: Normal range of motion. Neck supple. No JVD present. No tracheal deviation present. No thyromegaly present.  Cardiovascular: Normal rate, regular rhythm, normal heart sounds and intact distal pulses. Exam reveals no gallop and no friction rub.  No murmur heard. Pulmonary/Chest: Effort normal. No stridor. No respiratory distress. He has decreased breath sounds. He has no wheezes. He has no rales. He exhibits no tenderness.  Abdominal: Soft. Bowel sounds are normal. He exhibits no distension and no mass. There is no tenderness. There is no rebound and no guarding.  Musculoskeletal: Normal range of motion. He exhibits no edema or tenderness.  Lymphadenopathy:    He has no cervical adenopathy.  Neurological: He is  alert and oriented to person, place, and time. He has normal reflexes. No cranial nerve deficit. He exhibits normal muscle tone. Coordination normal.  Skin: Skin is warm. No rash noted. He is not diaphoretic. No erythema. No pallor.  Psychiatric: He has a normal mood and affect. His behavior is normal. Judgment and thought content normal.  Vitals reviewed.         Assessment & Plan:  General medical exam  Chronic kidney disease (CKD), stage III (moderate) (HCC)  Combined systolic and diastolic heart failure, unspecified HF chronicity (HCC)  History of acute inferior wall MI  Coronary artery disease involving native coronary artery without angina pectoris, unspecified whether native or transplanted heart  COPD with acute exacerbation ([place])  Immunizations are up-to-date.  Recommended a flu shot but the patient defers to go to his pharmacy where he can receive the quadrivalent flu shot.  We only carry the trivalent flu shot.  Chronic kidney disease is stable.  I have recommended starting Lipitor  20 mg a day for his elevated LDL cholesterol and his history of coronary artery disease as well as his history of an acute inferior wall myocardial infarction.  His COPD is stable but I would decrease his prednisone to 5 mg a day.  The remainder of his preventative care is up-to-date.  He denies any falls.  He denies any trouble with his vision or his hearing.  He denies any depression.  Regular anticipatory guidance is provided.

## 2018-05-01 ENCOUNTER — Other Ambulatory Visit: Payer: Self-pay | Admitting: Internal Medicine

## 2018-05-16 ENCOUNTER — Other Ambulatory Visit (HOSPITAL_COMMUNITY): Payer: Self-pay | Admitting: Internal Medicine

## 2018-05-16 ENCOUNTER — Other Ambulatory Visit: Payer: Self-pay | Admitting: Family Medicine

## 2018-05-19 ENCOUNTER — Ambulatory Visit: Payer: Medicare Other | Admitting: Internal Medicine

## 2018-05-22 ENCOUNTER — Ambulatory Visit: Payer: Medicare Other | Admitting: Internal Medicine

## 2018-05-31 ENCOUNTER — Other Ambulatory Visit (HOSPITAL_COMMUNITY): Payer: Self-pay | Admitting: Internal Medicine

## 2018-06-21 ENCOUNTER — Encounter: Payer: Self-pay | Admitting: Internal Medicine

## 2018-06-21 ENCOUNTER — Ambulatory Visit: Payer: Medicare Other | Admitting: Internal Medicine

## 2018-06-21 DIAGNOSIS — J449 Chronic obstructive pulmonary disease, unspecified: Secondary | ICD-10-CM

## 2018-06-21 DIAGNOSIS — J9611 Chronic respiratory failure with hypoxia: Secondary | ICD-10-CM

## 2018-06-21 MED ORDER — AZITHROMYCIN 250 MG PO TABS: ORAL_TABLET | ORAL | 0 refills | Status: DC

## 2018-06-21 NOTE — Assessment & Plan Note (Signed)
Adequate control on present rx, reviewed in detail with pt > no change in rx needed  = 3lpm 24/7 but up to 6 lpm with activity   Continue pulmonary rehab as planned    I had an extended discussion with the patient reviewing all relevant studies completed to date and  lasting 15 to 20 minutes of a 25 minute visit    Each maintenance medication was reviewed in detail including most importantly the difference between maintenance and prns and under what circumstances the prns are to be triggered using an action plan format that is not reflected in the computer generated alphabetically organized AVS.     Please see AVS for specific instructions unique to this visit that I personally wrote and verbalized to the the pt in detail and then reviewed with pt  by my nurse highlighting any  changes in therapy recommended at today's visit to their plan of care.

## 2018-06-21 NOTE — Progress Notes (Signed)
Subjective:    Patient ID: Bruce Travis, male    DOB: 12/16/1934    MRN: [id number]  Brief patient profile:   38   yowm son is MD/ quit smoking 1990 with GOLD II  COPD,  MPNs,  remote laryngeal cancer (? early 1990s)  with some dysphagia, allergic rhinitis, and esophageal reflux.    History of Present Illness  11/08/2011 [name]/ f/u ov on maint rx with symbicort and spiriva c/o nasal symptoms worse but  breathing fine, no limits as long as paces himself and no cough.  Main nasal issue is congestion/itching/sneezing worse x 2 weeks with purulent bloody secretions s HA/toothache  But ahs noted  year round nasal symptoms and worse x 2 years which do not correlate with breathing status Stopped nasonex due to insurance > no worse off it> has not f/u with [name] yet for ent re-eval No daytime saba need rec Augmentin 875 mg twice daily x 10days Prednisone 10 mg take  4 each am x 2 days,   2 each am x 2 days,  1 each am x2days and stop See Dr [name] in 2 weeks to let him direct you further on what to do about your chronic sinus symptoms      03/06/2013 f/u ov/[name] re GOLD II COPD/ chronic hoarse p surg ([facility])/ maint symb/ spiriva Chief Complaint  Patient presents with  . Follow-up    Pt states breathing is the same since last visit, no better or worse. Denies any new co's today.    main issue is yardwork in heat, bettter ex tol p uses saba, hfa good techniqe, never uses neb  rec Only use your albuterol (proaire) as a rescue medication Please see patient coordinator before you leave today  to schedule CT chest to compare to previous studies for your lung nodules> no change x 4 years so directed f/u indicated        04/12/2017  f/u ov/[name] re:  GOLD II copd / maint symb 160/spiriva dpi  Chief Complaint  Patient presents with  . Follow-up    Breathing is much improved since the last visit. He is attending pulmonary rehab 2 x per wk. He is using proair once per wk on average.   On 3lpm most the  time/ 6lpm with exertion like at rehab, doing about the same vs a year prior to OV   Back to doing some walking at [facility] behind a cart with 02 in the cart  Using proair first thing in am ? Why as supposed to use just prn  No worse sob off prednisone  rec Try spiriva respimat 2 pffs each am right behind the symbicort      07/11/2017  f/u ov/[name] re: copd II/ maint symb and spiriva smi Chief Complaint  Patient presents with  . Follow-up    Breathing is doing well. He uses his proair 1 x per wk on average.   maint rehab going well  Walks at lowe's slow pace @ 6lpm / 3lpm at rest and sleep and sats staying 90 or better most of the time  rec no change rx    Admit date: 12/29/2017 Discharge date: 12/31/2017  Following problems were addressed during his hospitalization:  Acute on chronic respiratory failure with hypoxia: Secondary to COPD and pneumonia. Continue supplemental oxygen at home, bronchodilators, steroids, antibiotics.  Community-acquired pneumonia:Chest x-ray showed bilateral pneumonia.Patient failed outpatient oral antibiotic. Started on azithromycin and ceftriaxone here.  Antibiotics changed to oral on discharge Negative  blood cultures, sputum cultures so far.Urine Streptococcalantigen negative.  COPD: Follows with pulmonology as an outpatient. On nebulization machine, bronchodilators at home.On 3 L oxygen at rest.  GERD: Continue Protonix  Hypertension: Currently blood pressure stable.Continue home meds.  Coronary artery disease with ischemic cardiomyopathy: Currently stable. No chest pain. Continue aspirin, Entresto, amiodarone  Chronic kidney disease stage III: Currently creatinine on baseline  History of BCW:[id number] Flomax  Lactic acidosis: Improved.  Diastolic CHF: 2D echo on 50/38 showed EF 55 to 60%.Currently he is euvolemic. No peripheral edema. Continue Entresto. Spironolactone on hold. Not on beta-blocker therapy due to severe  COPD.  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 Follows with cardiology   Discharge Diagnoses:  Principal Problem:   Acute respiratory failure with hypoxia ([place]) Active Problems:   GERD   COPD exacerbation ([place])   Hypertension   Community acquired pneumonia of left lower lobe of lung ([place])   CAD (coronary artery disease)   Chronic kidney disease (CKD), stage III (moderate) ([place])     02/17/2018  Ext post hosp f/u ov/[name] re:  Copd GOLD II/ 02 dep transition of care s/p above admit  Chief Complaint  Patient presents with  . Follow-up    Breathing is unchanged. He rarely uses albuterol inhaler or neb. He sta  Dyspnea:  On 3-4lpm can do any store = MMRC2 = can't walk a nl pace on a flat grade s sob but does fine slow and flat  Cough: min sporadic white mucus SABA use: rarely hfa/ no neb   Sleeping: lying flat on 3lpm ok  rec F/u cxr  > cleared    06/21/2018  f/u ov/[name] re:  GOLD II 02 dep 3lpm sitting up to 6 walking  Chief Complaint  Patient presents with  . Follow-up    Increased SOB x 2-3 days. He has been using his albuterol inhaler 2 x per wk but has not used neb.    Dyspnea:  Ok flat and slow = MMRC2 = can't walk a nl pace on a flat grade s sob but does fine slow and flat maint pulmonary rehab Cough: a little am congestion /variable x one week min discolored  Sleeping: bed flat/ sev pillows  SABA use: not using much  02: as above    No obvious day to day or daytime variability or assoc excess/ purulent sputum or mucus plugs or hemoptysis or cp or chest tightness, subjective wheeze or overt sinus or hb symptoms.   Sleeping as above  without nocturnal  or early am exacerbation  of respiratory  c/o's or need for noct saba. Also denies any obvious fluctuation of symptoms with weather or environmental changes or other aggravating or alleviating factors except as outlined above   No unusual exposure hx or h/o childhood pna/ asthma or knowledge of premature  birth.  Current Allergies, Complete Past Medical History, Past Surgical History, Family History, and Social History were reviewed in [employer] record.  ROS  The following are not active complaints unless bolded Hoarseness, sore throat, dysphagia, dental problems, itching, sneezing,  nasal congestion or discharge of excess mucus or purulent secretions, ear ache,   fever, chills, sweats, unintended wt loss or wt gain, classically pleuritic or exertional cp,  orthopnea pnd or arm/hand swelling  or leg swelling, presyncope, palpitations, abdominal pain, anorexia, nausea, vomiting, diarrhea  or change in bowel habits or change in bladder habits, change in stools or change in urine, dysuria, hematuria,  rash, arthralgias, visual complaints, headache, numbness, weakness or  ataxia or problems with walking or coordination,  change in mood or  memory.        Current Meds  Medication Sig  . albuterol (PROAIR HFA) 108 (90 Base) MCG/ACT inhaler INHALE 2 PUFFS EVERY 4 HOURS AS NEEDED FOR SHORTNESS OF BREATH.  Marland Kitchen albuterol (PROVENTIL) (2.5 MG/3ML) 0.083% nebulizer solution Take 3 mLs (2.5 mg total) by nebulization every 4 (four) hours as needed for wheezing or shortness of breath.  . ALPRAZolam (XANAX) 0.25 MG tablet Take 1 tablet (0.25 mg total) by mouth 2 (two) times daily as needed for anxiety.  Marland Kitchen amiodarone (PACERONE) 200 MG tablet TAKE 1/2 TABLET BY MOUTH ONCE DAILY.  Marland Kitchen aspirin EC 81 MG tablet Take 81 mg by mouth daily.  Marland Kitchen atorvastatin (LIPITOR) 20 MG tablet Take 1 tablet (20 mg total) by mouth daily.  Marland Kitchen azelastine (ASTELIN) 0.1 % nasal spray PLACE 2 SPRAYS INTO EACH NOSTRILS TWICE DAILY AS DIRECTED.  Marland Kitchen budesonide-formoterol (SYMBICORT) 160-4.5 MCG/ACT inhaler INHALE 2 PUFFS FIRST THING IN MORNING AND THEN ANOTHER 2 PUFFS ABOUT 12 HOURS LATER.  . Calcium Carbonate-Vitamin D (CALTRATE 600+D) 600-400 MG-UNIT tablet Take 1 tablet by mouth daily.   Marland Kitchen dextromethorphan-guaiFENesin (MUCINEX  DM) 30-600 MG 12hr tablet Take 1 tablet by mouth 2 (two) times daily.  Marland Kitchen ENTRESTO 49-51 MG TAKE (1) TABLET BY MOUTH TWICE DAILY.  Marland Kitchen HYDROcodone-homatropine (HYCODAN) 5-1.5 MG/5ML syrup Take 2.5 - 5 mLs PO Q 8 hours PRN for severe coughing. DO NOT TAKE at the same time as XANAX.  Marland Kitchen LUTEIN PO Take 1 capsule by mouth daily.  . montelukast (SINGULAIR) 10 MG tablet TAKE 1 TABLET BY MOUTH DAILY.  . Multiple Vitamins-Minerals (CENTRUM SILVER 50+MEN) TABS Take 1 tablet by mouth daily.  . OXYGEN Inhale 3 L into the lungs continuous. 3lpm with rest and 6 lpm with exertion  . pantoprazole (PROTONIX) 40 MG tablet TAKE ONE TABLET BY MOUTH DAILY. (Patient taking differently: TAKE ONE TABLET (40 MG TOTALLY) BY MOUTH DAILY.)  . polyethylene glycol (MIRALAX / GLYCOLAX) packet Take 17 g by mouth daily as needed for mild constipation. Mix in 8 oz liquid and drink  . predniSONE (DELTASONE) 5 MG tablet Take 1 tablet (5 mg total) by mouth daily with breakfast.  . SPIRIVA RESPIMAT 2.5 MCG/ACT AERS INHALE 2 PUFFS INTO THE LUNGS ONCE DAILY.  Marland Kitchen spironolactone (ALDACTONE) 25 MG tablet TAKE (1/2) TABLET BY MOUTH DAILY.  . tamsulosin (FLOMAX) 0.4 MG CAPS capsule TAKE ONE CAPSULE BY MOUTH ONCE DAILY.               Objective:   Physical Exam  amb hoarse wm   Vital signs reviewed - Note on arrival 02 sats  94% on 3lpm continous     Wt  140 04/08/2011 > 06/08/2011 141 >136 09/09/2011 > 141 11/08/2011 > 03/07/2012  139 > 09/07/2012  141 > 136 03/06/2013 > 03/01/2017   128 >  04/12/2017  125 > 07/11/2017   128 > 11/15/2017  129 > 02/17/2018   126 > 06/21/2018  132     HEENT: Edentulous/  Nl oropharynx. Nl external ear canals without cough reflex -  Mild bilateral non-specific turbinate edema     NECK :  without JVD/Nodes/TM/ nl carotid upstrokes bilaterally   LUNGS: no acc muscle use,  Mod barrel  contour chest wall with bilateral  Distant bs s audible wheeze and  without cough on insp or exp maneuver and mod  Hyperresonant   to  percussion bilaterally  CV:  RRR  no s3 or murmur or increase in P2, and no edema   ABD:  soft and nontender with pos mid insp Hoover's  in the supine position. No bruits or organomegaly appreciated, bowel sounds nl  MS:   Nl gait/  ext warm without deformities, calf tenderness, cyanosis or clubbing No obvious joint restrictions   SKIN: warm and dry without lesions    NEURO:  alert, approp, nl sensorium with  no motor or cerebellar deficits apparent.                           Assessment & Plan:

## 2018-06-21 NOTE — Assessment & Plan Note (Signed)
-   PFT's 06/08/11  FEV1  1.71 (67%) ratio 37 and no better p B2 with DLC0 54% -  PFT's  04/12/2017  FEV1 1.62  (65 % ) ratio 43  p 6 % improvement from saba p spiriva  prior to study with DLCO  24/23 % corrects to 27  % for alv volume   - 04/12/2017  After extensive coaching device effectiveness =    75% so changed to spiriva smi     - 11/15/2017  After extensive coaching inhaler device  effectiveness =    90%   ? Mild flare related to uri vs ? Side effect from entresto   rec zpak Reviewed optimal rx with mucinex dm/ saba  F/u if not better in 2 weeks

## 2018-06-21 NOTE — Patient Instructions (Signed)
No change in your long term  Medications  Zpak and call if your present cough doesn't clear up  For cough can use up to Mucinex dm 1200 mg every 12 hours as needed    Please schedule a follow up visit in 6 months but call sooner if needed

## 2018-06-27 ENCOUNTER — Other Ambulatory Visit (HOSPITAL_COMMUNITY): Payer: Self-pay | Admitting: Internal Medicine

## 2018-07-18 ENCOUNTER — Ambulatory Visit (HOSPITAL_COMMUNITY)
Admission: EM | Admit: 2018-07-18 | Discharge: 2018-07-18 | Disposition: A | Discharge status: Home / Self Care | Payer: Medicare Other | Attending: Family Medicine | Admitting: Family Medicine

## 2018-07-18 ENCOUNTER — Ambulatory Visit (INDEPENDENT_AMBULATORY_CARE_PROVIDER_SITE_OTHER): Payer: Medicare Other

## 2018-07-18 ENCOUNTER — Encounter (HOSPITAL_COMMUNITY): Payer: Self-pay

## 2018-07-18 DIAGNOSIS — M546 Pain in thoracic spine: Secondary | ICD-10-CM

## 2018-07-18 DIAGNOSIS — R05 Cough: Secondary | ICD-10-CM | POA: Diagnosis not present

## 2018-07-18 DIAGNOSIS — R059 Cough, unspecified: Secondary | ICD-10-CM

## 2018-07-18 DIAGNOSIS — Z9009 Acquired absence of other part of head and neck: Secondary | ICD-10-CM

## 2018-07-18 DIAGNOSIS — Z9981 Dependence on supplemental oxygen: Secondary | ICD-10-CM

## 2018-07-18 MED ORDER — DOXYCYCLINE HYCLATE 100 MG PO CAPS: 100.0000 mg | ORAL_CAPSULE | Freq: Two times a day (BID) | ORAL | 0 refills | Status: DC

## 2018-07-18 MED ORDER — DOXYCYCLINE HYCLATE 100 MG PO CAPS: 100.0000 mg | ORAL_CAPSULE | Freq: Two times a day (BID) | ORAL | 0 refills | Status: AC

## 2018-07-18 MED ORDER — HYDROCODONE-HOMATROPINE 5-1.5 MG/5ML PO SYRP: 5.0000 mL | ORAL_SOLUTION | Freq: Four times a day (QID) | ORAL | 0 refills | Status: DC | PRN

## 2018-07-18 MED ORDER — BENZONATATE 200 MG PO CAPS: 200.0000 mg | ORAL_CAPSULE | Freq: Three times a day (TID) | ORAL | 0 refills | Status: DC | PRN

## 2018-07-18 MED ORDER — BENZONATATE 200 MG PO CAPS: 200.0000 mg | ORAL_CAPSULE | Freq: Three times a day (TID) | ORAL | 0 refills | Status: AC | PRN

## 2018-07-18 NOTE — Discharge Instructions (Signed)
No pnuemonia on chest xray Back pain most likely muscular from cough Begin doxycycline to cover and atypical respiratory illness Tessalon for cough during the day Hydrocodone/homatropine cough syrup at bedtime  Please follow up if pain worsening, not improving, developing increased shortness of breath or wheezing

## 2018-07-18 NOTE — ED Provider Notes (Signed)
[place]    CSN: [id number] Arrival date & time: 07/18/18  1062     History   Chief Complaint Chief Complaint  Patient presents with  . Back Pain  . Cough    HPI Bruce Travis is a 83 y.o. male history of CHF, CKD, COPD, CAD, previous throat cancer with epiglottis removal, among others listed below, presenting today for evaluation of cough and back pain.  Patient states that he has had a cough at baseline since he has epiglottis removed as he often has drainage into his lungs, but over the past week and a half he has had worsening cough that is deeper.  This is caused back pain to gradually develop.  Back pain worse with coughing and deep breathing.  Patient is on 3 L chronically, states that his oxygen typically is 93/94% at baseline.  When moving he has increased his oxygen to 6 L.  He denies URI symptoms of congestion, rhinorrhea or sore throat.  Denies ear pain.  Denies any known fevers.  Is on Symbicort and Spiriva daily as well as albuterol as needed.  He has been taking hydrocodone/homatroprine cough syrup for 1 dose last night to help with the cough.  HPI  Past Medical History:  Diagnosis Date  . Allergic rhinitis   . Anxiety   . Bronchiectasis   . CAD (coronary artery disease)   . Celiac disease   . Chronic heart failure ([place])   . Chronic kidney disease (CKD), stage III (moderate) (HCC)   . COPD (chronic obstructive pulmonary disease) ([place])   . Diverticulosis of colon (without mention of hemorrhage)   . Esophageal reflux   . Family history of adverse reaction to anesthesia    daughter gets PONV  . Family hx of colon cancer   . History of stomach ulcers 1980s  . Hyperlipemia   . Hypertension   . 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 Myocardial infarction [facility])    "previous MI/echo in 12/2015"  . Nephrolithiasis    "got them now; never had OR/scopes" (02/03/2016)  . On home oxygen therapy    "3L-5L; 24/7" (12/29/2017)  . Other  diseases of lung, not elsewhere classified   . Personal history of colonic polyps 04/26/2007   hyperplastic   . Pneumonia "several times"    Patient Active Problem List   Diagnosis Date Noted  . Chronic respiratory failure with hypoxia ([place]) 03/02/2017  . CAD (coronary artery disease)   . Chronic kidney disease (CKD), stage III (moderate) (HCC)   . Community acquired pneumonia of left lower lobe of lung ([place]) 08/06/2016  . Heart failure ([place]) 02/03/2016  . Cardiomyopathy, ischemic 01/06/2016  . History of acute inferior wall MI 01/06/2016  . PVC's (premature ventricular contractions) 12/12/2015  . Insomnia 04/16/2015  . Nocturnal hypoxemia 03/06/2013  . Acute respiratory failure with hypoxia ([place]) 01/10/2012  . Allergic rhinitis 01/10/2012  . Hypertension 01/10/2012  . COPD exacerbation ([place]) 08/11/2011  . Dysphagia 04/13/2011  . Hoarseness 04/02/2011  . Celiac disease 12/15/2010  . SINUSITIS, CHRONIC 09/10/2009  . ARTHUS PHENOMENON 10/05/2007  . Multiple pulmonary nodules determined by computed tomography of lung assoc with bronchiectasis 08/11/2007  . COPD GOLD II  07/06/2007  . GERD 07/06/2007    Past Surgical History:  Procedure Laterality Date  . CARDIAC CATHETERIZATION  02/03/2016  . CARDIAC CATHETERIZATION  09/30/2009   non-obstructive CAD w/30% narrowing in prox LAD (Dr. Waunita Schooner)  . CARDIAC  CATHETERIZATION  05/04/2001   same at 2011 cath (Dr. Waunita Schooner)  . CARDIAC CATHETERIZATION N/A 02/03/2016   Procedure: Right/Left Heart Cath and Coronary Angiography;  Surgeon: Pixie Casino, MD;  Location: [place] [facility];  Service: Cardiovascular;  Laterality: N/A;  . EPIGLOTOPLASTY W/ MLB     removal due to carcinoma  . EXCISIONAL HEMORRHOIDECTOMY  2000s  . HAND SURGERY Right 1958   d/t crush injury  . TONSILLECTOMY AND ADENOIDECTOMY    . ULTRASOUND GUIDANCE FOR VASCULAR ACCESS  02/03/2016   Procedure: Ultrasound Guidance For Vascular Access;  Surgeon: Pixie Casino,  MD;  Location: [place] [facility];  Service: Cardiovascular;;  . VASECTOMY         Home Medications    Prior to Admission medications   Medication Sig Start Date End Date Taking? Authorizing Provider  albuterol (PROAIR HFA) 108 (90 Base) MCG/ACT inhaler INHALE 2 PUFFS EVERY 4 HOURS AS NEEDED FOR SHORTNESS OF BREATH. 11/10/16   Susy Frizzle, MD  albuterol (PROVENTIL) (2.5 MG/3ML) 0.083% nebulizer solution Take 3 mLs (2.5 mg total) by nebulization every 4 (four) hours as needed for wheezing or shortness of breath. 01/12/18   Susy Frizzle, MD  ALPRAZolam Duanne Moron) 0.25 MG tablet Take 1 tablet (0.25 mg total) by mouth 2 (two) times daily as needed for anxiety. 03/30/18   Susy Frizzle, MD  amiodarone (PACERONE) 200 MG tablet TAKE 1/2 TABLET BY MOUTH ONCE DAILY. 03/03/18   Larey Dresser, MD  aspirin EC 81 MG tablet Take 81 mg by mouth daily.    [provider]  atorvastatin (LIPITOR) 20 MG tablet Take 1 tablet (20 mg total) by mouth daily. 04/27/18   Susy Frizzle, MD  azelastine (ASTELIN) 0.1 % nasal spray PLACE 2 SPRAYS INTO EACH NOSTRILS TWICE DAILY AS DIRECTED. 12/13/17   Susy Frizzle, MD  benzonatate (TESSALON) 200 MG capsule Take 1 capsule (200 mg total) by mouth 3 (three) times daily as needed for up to 7 days for cough. 07/18/18 07/25/18  Kensly Bowmer C, PA-C  budesonide-formoterol (SYMBICORT) 160-4.5 MCG/ACT inhaler INHALE 2 PUFFS FIRST THING IN MORNING AND THEN ANOTHER 2 PUFFS ABOUT 12 HOURS LATER. 01/27/18   Susy Frizzle, MD  Calcium Carbonate-Vitamin D (CALTRATE 600+D) 600-400 MG-UNIT tablet Take 1 tablet by mouth daily.     [provider]  dextromethorphan-guaiFENesin (MUCINEX DM) 30-600 MG 12hr tablet Take 1 tablet by mouth 2 (two) times daily.    [provider]  doxycycline (VIBRAMYCIN) 100 MG capsule Take 1 capsule (100 mg total) by mouth 2 (two) times daily for 7 days. 07/18/18 07/25/18  Preslie Depasquale C, PA-C  ENTRESTO 49-51 MG  TAKE (1) TABLET BY MOUTH TWICE DAILY. 05/31/18   Bensimhon, Shaune Pascal, MD  HYDROcodone-homatropine [facility]) 5-1.5 MG/5ML syrup Take 5 mLs by mouth every 6 (six) hours as needed for cough. 07/18/18   Horton Ellithorpe C, PA-C  LUTEIN PO Take 1 capsule by mouth daily.    [provider]  montelukast (SINGULAIR) 10 MG tablet TAKE 1 TABLET BY MOUTH DAILY. 05/16/18   Susy Frizzle, MD  Multiple Vitamins-Minerals (CENTRUM SILVER 50+MEN) TABS Take 1 tablet by mouth daily.    [provider]  OXYGEN Inhale 3 L into the lungs continuous. 3lpm with rest and 6 lpm with exertion    [provider]  pantoprazole (PROTONIX) 40 MG tablet TAKE ONE TABLET BY MOUTH DAILY. Patient taking differently: TAKE ONE TABLET (40 MG TOTALLY)  BY MOUTH DAILY. 09/21/17   Susy Frizzle, MD  polyethylene glycol (MIRALAX / Floria Raveling) packet Take 17 g by mouth daily as needed for mild constipation. Mix in 8 oz liquid and drink    [provider]  predniSONE (DELTASONE) 5 MG tablet Take 1 tablet (5 mg total) by mouth daily with breakfast. 04/27/18   Susy Frizzle, MD  SPIRIVA RESPIMAT 2.5 MCG/ACT AERS INHALE 2 PUFFS INTO THE LUNGS ONCE DAILY. 05/01/18   Tanda Rockers, MD  spironolactone (ALDACTONE) 25 MG tablet TAKE (1/2) TABLET BY MOUTH DAILY. 05/16/18   Bensimhon, Shaune Pascal, MD  tamsulosin (FLOMAX) 0.4 MG CAPS capsule TAKE ONE CAPSULE BY MOUTH ONCE DAILY. 04/05/18   Susy Frizzle, MD    Family History Family History  Problem Relation Age of Onset  . Heart disease Father   . Colon cancer Father   . Prostate cancer Father   . Stroke Mother   . Endometrial cancer Sister   . Stroke Maternal Grandmother   . Cancer Paternal Grandmother     Social History Social History   Tobacco Use  . Smoking status: Former Smoker    Packs/day: 2.00    Years: 35.00    Pack years: 70.00    Types: Cigarettes    Last attempt to quit: 01/21/1989    Years since quitting: 29.5  . Smokeless tobacco:  Former Systems developer    Types: Chew    Quit date: 08/23/2001  Substance Use Topics  . Alcohol use: No  . Drug use: No     Allergies   Codeine; Adhesive [tape]; and Gluten meal   Review of Systems Review of Systems  Constitutional: Negative for activity change, appetite change, chills, fatigue and fever.  HENT: Negative for congestion, ear pain, rhinorrhea, sinus pressure, sore throat and trouble swallowing.   Eyes: Negative for discharge and redness.  Respiratory: Positive for cough. Negative for chest tightness and shortness of breath.   Cardiovascular: Negative for chest pain.  Gastrointestinal: Negative for abdominal pain, diarrhea, nausea and vomiting.  Musculoskeletal: Positive for back pain and myalgias.  Skin: Negative for rash.  Neurological: Negative for dizziness, light-headedness and headaches.     Physical Exam Triage Vital Signs ED Triage Vitals  Enc Vitals Group     BP 07/18/18 1107 126/66     Pulse Rate 07/18/18 1107 82     Resp 07/18/18 1107 18     Temp 07/18/18 1107 98.5 F (36.9 C)     Temp Source 07/18/18 1107 Oral     SpO2 07/18/18 1107 91 %     Weight --      Height --      Head Circumference --      Peak Flow --      Pain Score 07/18/18 1108 8     Pain Loc --      Pain Edu? --      Excl. in [place]? --    No data found.  Updated Vital Signs BP 126/66 (BP Location: Right Arm)   Pulse 82   Temp 98.5 F (36.9 C) (Oral)   Resp 18   SpO2 91%   Visual Acuity Right Eye Distance:   Left Eye Distance:   Bilateral Distance:    Right Eye Near:   Left Eye Near:    Bilateral Near:     Physical Exam  Constitutional: He appears well-developed and well-nourished.  HENT:  Head: Normocephalic and atraumatic.  Bilateral ears without tenderness to palpation  of external auricle, tragus and mastoid, EAC's without erythema or swelling, TM's with good bony landmarks and cone of light. Non erythematous.  Oral mucosa pink and moist, no tonsillar enlargement or  exudate. Posterior pharynx patent and erythematous, uvula small/nonexistent. Normal phonation.  Eyes: Conjunctivae are normal.  Neck: Neck supple.  Cardiovascular: Normal rate and regular rhythm.  No murmur heard. Pulmonary/Chest: Effort normal and breath sounds normal. No respiratory distress.  On 3 L O2 Breathing comfortably at rest, CTABL, no wheezing, rales or other adventitious sounds auscultated  Abdominal: Soft. There is no tenderness.  Musculoskeletal: He exhibits no edema.  Neurological: He is alert.  Skin: Skin is warm and dry.  Psychiatric: He has a normal mood and affect.  Nursing note and vitals reviewed.    UC Treatments / Results  Labs (all labs ordered are listed, but only abnormal results are displayed) Labs Reviewed - No data to display  EKG None  Radiology Dg Chest 2 View  Result Date: 07/18/2018 CLINICAL DATA:  Cough for 1 week EXAM: CHEST - 2 VIEW COMPARISON:  02/17/2018 FINDINGS: Cardiac shadow is stable. Aortic calcifications are again seen. Changes consistent with prior granulomatous disease are noted. Fibrotic changes are noted throughout both lungs stable from the previous exam. No focal infiltrate is seen. No acute bony abnormality is noted. IMPRESSION: Chronic changes without acute abnormality. Electronically Signed   By: Inez Catalina M.D.   On: 07/18/2018 11:50    Procedures Procedures (including critical care time)  Medications Ordered in UC Medications - No data to display  Initial Impression / Assessment and Plan / UC Course  I have reviewed the triage vital signs and the nursing notes.  Pertinent labs & imaging results that were available during my care of the patient were reviewed by me and considered in my medical decision making (see chart for details).  Clinical Course as of Jul 18 1221  Tue Jul 18, 2018  1126 Oxygen rechecked 94% on 3 L   [HW]    Clinical Course User Index [HW] Toriano Aikey, [place] C, PA-C   Chest x-ray stable from  previous chest x-rays, no acute changes.  No pneumonia.  Back pain most likely muscular given increased coughing.  Will provide doxycycline to cover atypical respiratory illness, Tessalon for cough during the day, refilled Hycodan to use only at bedtime.  Discussed drowsiness regarding this medicine.  O2 level stable at baseline.  No wheezing auscultated warranting prednisone at this time.  Discussed strict return precautions. Patient verbalized understanding and is agreeable with plan.  Final Clinical Impressions(s) / UC Diagnoses   Final diagnoses:  Cough  Acute bilateral thoracic back pain     Discharge Instructions     No pnuemonia on chest xray Back pain most likely muscular from cough Begin doxycycline to cover and atypical respiratory illness Tessalon for cough during the day Hydrocodone/homatropine cough syrup at bedtime  Please follow up if pain worsening, not improving, developing increased shortness of breath or wheezing    ED Prescriptions    Medication Sig Dispense Auth. Provider   benzonatate (TESSALON) 200 MG capsule  (Status: Discontinued) Take 1 capsule (200 mg total) by mouth 3 (three) times daily as needed for up to 7 days for cough. 28 capsule Katryna Tschirhart C, PA-C   doxycycline (VIBRAMYCIN) 100 MG capsule  (Status: Discontinued) Take 1 capsule (100 mg total) by mouth 2 (two) times daily for 7 days. 14 capsule June Rode C, PA-C   HYDROcodone-homatropine (HYCODAN) 5-1.5 MG/5ML syrup  Take 5 mLs by mouth every 6 (six) hours as needed for cough. 60 mL Christiaan Strebeck C, PA-C   benzonatate (TESSALON) 200 MG capsule Take 1 capsule (200 mg total) by mouth 3 (three) times daily as needed for up to 7 days for cough. 28 capsule Zaia Carre C, PA-C   doxycycline (VIBRAMYCIN) 100 MG capsule Take 1 capsule (100 mg total) by mouth 2 (two) times daily for 7 days. 14 capsule Branch Pacitti C, PA-C     Controlled Substance Prescriptions [place] Controlled Substance Registry  consulted? No   Janith Lima, PA-C 07/18/18 1224

## 2018-07-18 NOTE — ED Triage Notes (Signed)
Pt presents with back pain in the middle of back and is worse when coughing.

## 2018-07-25 ENCOUNTER — Ambulatory Visit: Payer: Medicare Other | Admitting: Family Medicine

## 2018-07-25 ENCOUNTER — Encounter: Payer: Self-pay | Admitting: Family Medicine

## 2018-07-25 VITALS — BP 126/72 | HR 82 | Temp 98.0°F | Resp 18 | Ht 66.5 in | Wt 133.0 lb

## 2018-07-25 DIAGNOSIS — R091 Pleurisy: Secondary | ICD-10-CM | POA: Diagnosis not present

## 2018-07-25 MED ORDER — TIZANIDINE HCL 4 MG PO TABS: 4.0000 mg | ORAL_TABLET | Freq: Four times a day (QID) | ORAL | 0 refills | Status: DC | PRN

## 2018-07-25 MED ORDER — HYDROCODONE-ACETAMINOPHEN 5-325 MG PO TABS: 1.0000 | ORAL_TABLET | Freq: Four times a day (QID) | ORAL | 0 refills | Status: DC | PRN

## 2018-07-25 NOTE — Progress Notes (Signed)
Subjective:    Patient ID: Bruce Travis, male    DOB: 10-10-1934, 83 y.o.   MRN: [id number]  HPI Patient was seen in urgent care on November 26 due to pleurisy.  He reports sharp stabbing pain every time he takes a deep breath in directly below his left scapula and between the ribs.  He reports increasing shortness of breath.  He was seen in the urgent care and was diagnosed with musculoskeletal pain but was given doxycycline in case this was an atypical infection.  Official read on the chest x-ray shows no acute findings.  He has been taking the antibiotics without benefit.  He states that whenever he coughs, he has intense 9 out of 10 pain located just below the scapula and the posterior aspect of his left chest wall.  He also reports increasing shortness of breath.  He reports sharp pain when he takes a deep breath in.  He denies any hemoptysis.  He denies any purulent sputum.  He denies any anterior chest pain.  He denies any nausea or vomiting melena or Pepsi.  He denies any pain with range of motion of his left shoulder Past Medical History:  Diagnosis Date  . Allergic rhinitis   . Anxiety   . Bronchiectasis   . CAD (coronary artery disease)   . Celiac disease   . Chronic heart failure ([place])   . Chronic kidney disease (CKD), stage III (moderate) (HCC)   . COPD (chronic obstructive pulmonary disease) ([place])   . Diverticulosis of colon (without mention of hemorrhage)   . Esophageal reflux   . Family history of adverse reaction to anesthesia    daughter gets PONV  . Family hx of colon cancer   . History of stomach ulcers 1980s  . Hyperlipemia   . Hypertension   . 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 Myocardial infarction [facility])    "previous MI/echo in 12/2015"  . Nephrolithiasis    "got them now; never had OR/scopes" (02/03/2016)  . On home oxygen therapy    "3L-5L; 24/7" (12/29/2017)  . Other diseases of lung, not elsewhere classified   . Personal history of  colonic polyps 04/26/2007   hyperplastic   . Pneumonia "several times"   Past Surgical History:  Procedure Laterality Date  . CARDIAC CATHETERIZATION  02/03/2016  . CARDIAC CATHETERIZATION  09/30/2009   non-obstructive CAD w/30% narrowing in prox LAD (Dr. Waunita Schooner)  . CARDIAC CATHETERIZATION  05/04/2001   same at 2011 cath (Dr. Waunita Schooner)  . CARDIAC CATHETERIZATION N/A 02/03/2016   Procedure: Right/Left Heart Cath and Coronary Angiography;  Surgeon: Pixie Casino, MD;  Location: [place] [facility];  Service: Cardiovascular;  Laterality: N/A;  . EPIGLOTOPLASTY W/ MLB     removal due to carcinoma  . EXCISIONAL HEMORRHOIDECTOMY  2000s  . HAND SURGERY Right 1958   d/t crush injury  . TONSILLECTOMY AND ADENOIDECTOMY    . ULTRASOUND GUIDANCE FOR VASCULAR ACCESS  02/03/2016   Procedure: Ultrasound Guidance For Vascular Access;  Surgeon: Pixie Casino, MD;  Location: [place] [facility];  Service: Cardiovascular;;  . VASECTOMY     Current Outpatient Medications on File Prior to Visit  Medication Sig Dispense Refill  . albuterol (PROAIR HFA) 108 (90 Base) MCG/ACT inhaler INHALE 2 PUFFS EVERY 4 HOURS AS NEEDED FOR SHORTNESS OF BREATH. 2 Inhaler 5  . albuterol (PROVENTIL) (2.5 MG/3ML) 0.083% nebulizer solution Take 3 mLs (2.5 mg total)  by nebulization every 4 (four) hours as needed for wheezing or shortness of breath. 75 mL 5  . ALPRAZolam (XANAX) 0.25 MG tablet Take 1 tablet (0.25 mg total) by mouth 2 (two) times daily as needed for anxiety. 20 tablet 0  . amiodarone (PACERONE) 200 MG tablet TAKE 1/2 TABLET BY MOUTH ONCE DAILY. 15 tablet 5  . aspirin EC 81 MG tablet Take 81 mg by mouth daily.    Marland Kitchen atorvastatin (LIPITOR) 20 MG tablet Take 1 tablet (20 mg total) by mouth daily. 90 tablet 3  . azelastine (ASTELIN) 0.1 % nasal spray PLACE 2 SPRAYS INTO EACH NOSTRILS TWICE DAILY AS DIRECTED. 30 mL 5  . benzonatate (TESSALON) 200 MG capsule Take 1 capsule (200 mg total) by mouth 3 (three) times daily  as needed for up to 7 days for cough. (Patient not taking: Reported on 07/25/2018) 28 capsule 0  . budesonide-formoterol (SYMBICORT) 160-4.5 MCG/ACT inhaler INHALE 2 PUFFS FIRST THING IN MORNING AND THEN ANOTHER 2 PUFFS ABOUT 12 HOURS LATER. 10.2 g 11  . Calcium Carbonate-Vitamin D (CALTRATE 600+D) 600-400 MG-UNIT tablet Take 1 tablet by mouth daily.     Marland Kitchen dextromethorphan-guaiFENesin (MUCINEX DM) 30-600 MG 12hr tablet Take 1 tablet by mouth 2 (two) times daily.    Marland Kitchen doxycycline (VIBRAMYCIN) 100 MG capsule Take 1 capsule (100 mg total) by mouth 2 (two) times daily for 7 days. 14 capsule 0  . ENTRESTO 49-51 MG TAKE (1) TABLET BY MOUTH TWICE DAILY. 60 tablet 5  . HYDROcodone-homatropine (HYCODAN) 5-1.5 MG/5ML syrup Take 5 mLs by mouth every 6 (six) hours as needed for cough. 60 mL 0  . LUTEIN PO Take 1 capsule by mouth daily.    . montelukast (SINGULAIR) 10 MG tablet TAKE 1 TABLET BY MOUTH DAILY. 90 tablet 3  . Multiple Vitamins-Minerals (CENTRUM SILVER 50+MEN) TABS Take 1 tablet by mouth daily.    . OXYGEN Inhale 3 L into the lungs continuous. 3lpm with rest and 6 lpm with exertion    . pantoprazole (PROTONIX) 40 MG tablet TAKE ONE TABLET BY MOUTH DAILY. (Patient taking differently: TAKE ONE TABLET (40 MG TOTALLY) BY MOUTH DAILY.) 90 tablet 3  . polyethylene glycol (MIRALAX / GLYCOLAX) packet Take 17 g by mouth daily as needed for mild constipation. Mix in 8 oz liquid and drink    . predniSONE (DELTASONE) 5 MG tablet Take 1 tablet (5 mg total) by mouth daily with breakfast. 30 tablet 5  . SPIRIVA RESPIMAT 2.5 MCG/ACT AERS INHALE 2 PUFFS INTO THE LUNGS ONCE DAILY. 4 g 11  . spironolactone (ALDACTONE) 25 MG tablet TAKE (1/2) TABLET BY MOUTH DAILY. 15 tablet 3  . tamsulosin (FLOMAX) 0.4 MG CAPS capsule TAKE ONE CAPSULE BY MOUTH ONCE DAILY. 90 capsule 3   No current facility-administered medications on file prior to visit.    Allergies  Allergen Reactions  . Codeine Other (See Comments)    Thought  head was going to blow off/ severe headache  . Adhesive [Tape] Other (See Comments)    Band-aids - tears skin off  . Gluten Meal Other (See Comments)    Celiac disease   Social History   Socioeconomic History  . Marital status: Married    Spouse name: Not on file  . Number of children: 3  . Years of education: Not on file  . Highest education level: Not on file  Occupational History  . Occupation: retired    Fish farm manager: RETIRED    Comment: farmer and truck  driver  Social Needs  . Financial resource strain: Not on file  . Food insecurity:    Worry: Not on file    Inability: Not on file  . Transportation needs:    Medical: Not on file    Non-medical: Not on file  Tobacco Use  . Smoking status: Former Smoker    Packs/day: 2.00    Years: 35.00    Pack years: 70.00    Types: Cigarettes    Last attempt to quit: 01/21/1989    Years since quitting: 29.5  . Smokeless tobacco: Former Systems developer    Types: [place] date: 08/23/2001  Substance and Sexual Activity  . Alcohol use: No  . Drug use: No  . Sexual activity: Not on file  Lifestyle  . Physical activity:    Days per week: Not on file    Minutes per session: Not on file  . Stress: Not on file  Relationships  . Social connections:    Talks on phone: Not on file    Gets together: Not on file    Attends religious service: Not on file    Active member of club or organization: Not on file    Attends meetings of clubs or organizations: Not on file    Relationship status: Not on file  . Intimate partner violence:    Fear of current or ex partner: Not on file    Emotionally abused: Not on file    Physically abused: Not on file    Forced sexual activity: Not on file  Other Topics Concern  . Not on file  Social History Narrative  . Not on file      Review of Systems  All other systems reviewed and are negative.      Objective:   Physical Exam  Cardiovascular: Normal rate, regular rhythm and normal heart sounds.  No  murmur heard. Pulmonary/Chest: Effort normal. No accessory muscle usage. No respiratory distress. He has decreased breath sounds. He has wheezes. He has rales.      Abdominal: Soft. Bowel sounds are normal. He exhibits no distension and no mass. There is no tenderness. There is no rebound and no guarding.  Musculoskeletal: He exhibits no edema.  Vitals reviewed.         Assessment & Plan:  Pleurisy - Plan: CT Chest W Contrast  I believe most likely the patient torn intercostal muscle due to coughing.  However given the fact his symptoms are worsening, given the fact that his pain is worsening, given the fact the patient has increasing shortness of breath, and given the fact that the initial imaging modality was indeterminate i.e. a normal chest x-ray, I have recommended proceeding with a CT scan of the chest to rule out a pulmonary embolism.  The patient did recently drive to the beach and the pain began shortly thereafter.  However I believe most likely this is an intercostal muscle that is torn or possibly even a cracked rib.  Therefore I recommended symptomatic treatment with Zanaflex 4 mg every 6 hours as needed for muscle pain or hydrocodone/acetaminophen 5/325 1 p.o. every 4 hours as needed pain.  I recommended the patient try each medication individually to see which one helps the pain the most and then stick with that and not combine the 2 medications.  The results of the CT scan.  My pretest probability for pulmonary embolism is low

## 2018-07-27 ENCOUNTER — Other Ambulatory Visit: Payer: Self-pay | Admitting: Family Medicine

## 2018-07-27 ENCOUNTER — Telehealth: Payer: Self-pay | Admitting: Family Medicine

## 2018-07-27 ENCOUNTER — Ambulatory Visit
Admission: RE | Admit: 2018-07-27 | Discharge: 2018-07-27 | Disposition: A | Discharge status: Home / Self Care | Payer: Medicare Other | Source: Ambulatory Visit | Attending: Family Medicine | Admitting: Family Medicine

## 2018-07-27 DIAGNOSIS — R091 Pleurisy: Secondary | ICD-10-CM

## 2018-07-27 MED ORDER — IOPAMIDOL (ISOVUE-370) INJECTION 76%
75.0000 mL | Freq: Once | INTRAVENOUS | Status: AC | PRN
  Administered 2018-07-27: 75 mL via INTRAVENOUS

## 2018-07-27 NOTE — Telephone Encounter (Signed)
Patient calling about scan results  Says the pain is ridiculous  [phone]

## 2018-07-28 ENCOUNTER — Other Ambulatory Visit: Payer: Self-pay | Admitting: Family Medicine

## 2018-07-28 ENCOUNTER — Telehealth: Payer: Self-pay | Admitting: Family Medicine

## 2018-07-28 NOTE — Telephone Encounter (Signed)
Pt called and states that the pain medication helps some but would like to know if he can have something a little stronger?  He is taking it every 6 hours.

## 2018-07-28 NOTE — Telephone Encounter (Signed)
Pt aware of results 

## 2018-07-28 NOTE — Telephone Encounter (Signed)
Called patient.  Recommended not to increase norco given pulmonary issues.

## 2018-08-03 ENCOUNTER — Encounter: Payer: Self-pay | Admitting: Family Medicine

## 2018-08-03 ENCOUNTER — Ambulatory Visit: Payer: Medicare Other | Admitting: Family Medicine

## 2018-08-03 ENCOUNTER — Other Ambulatory Visit: Payer: Self-pay

## 2018-08-03 VITALS — BP 144/72 | HR 70 | Temp 97.9°F | Resp 16 | Ht 66.5 in | Wt 129.0 lb

## 2018-08-03 DIAGNOSIS — S22060D Wedge compression fracture of T7-T8 vertebra, subsequent encounter for fracture with routine healing: Secondary | ICD-10-CM | POA: Diagnosis not present

## 2018-08-03 MED ORDER — CALCITONIN (SALMON) 200 UNIT/ACT NA SOLN: 1.0000 | Freq: Every day | NASAL | 12 refills | Status: DC

## 2018-08-03 MED ORDER — HYDROCODONE-ACETAMINOPHEN 5-325 MG PO TABS: 1.0000 | ORAL_TABLET | Freq: Four times a day (QID) | ORAL | 0 refills | Status: DC | PRN

## 2018-08-03 NOTE — Progress Notes (Signed)
Subjective:    Patient ID: Bruce Travis, male    DOB: 22-Mar-1935, 83 y.o.   MRN: [id number]  HPI  07/25/18 Patient was seen in urgent care on November 26 due to pleurisy.  He reports sharp stabbing pain every time he takes a deep breath in directly below his left scapula and between the ribs.  He reports increasing shortness of breath.  He was seen in the urgent care and was diagnosed with musculoskeletal pain but was given doxycycline in case this was an atypical infection.  Official read on the chest x-ray shows no acute findings.  He has been taking the antibiotics without benefit.  He states that whenever he coughs, he has intense 9 out of 10 pain located just below the scapula and the posterior aspect of his left chest wall.  He also reports increasing shortness of breath.  He reports sharp pain when he takes a deep breath in.  He denies any hemoptysis.  He denies any purulent sputum.  He denies any anterior chest pain.  He denies any nausea or vomiting melena or Pepsi.  He denies any pain with range of motion of his left shoulder.  At that time, my plan was: I believe most likely the patient torn intercostal muscle due to coughing.  However given the fact his symptoms are worsening, given the fact that his pain is worsening, given the fact the patient has increasing shortness of breath, and given the fact that the initial imaging modality was indeterminate i.e. a normal chest x-ray, I have recommended proceeding with a CT scan of the chest to rule out a pulmonary embolism.  The patient did [place] drive to the beach and the pain began shortly thereafter.  However I believe most likely this is an intercostal muscle that is torn or possibly even a cracked rib.  Therefore I recommended symptomatic treatment with Zanaflex 4 mg every 6 hours as needed for muscle pain or hydrocodone/acetaminophen 5/325 1 p.o. every 4 hours as needed pain.  I recommended the patient try each medication individually to see which  one helps the pain the most and then stick with that and not combine the 2 medications.  The results of the CT scan.  My pretest probability for pulmonary embolism is low  08/03/18 CT scan was obtained and surprisingly revealed an age-indeterminate T7 compression fracture.  This is in the exact location that the patient is hurting.  In fact today he is tender to palpation over the spinous process in this area although he still has referred pain to right below his left scapula.  He has been using hydrocodone 3-4 times every day and this is helping to control the pain.  He is taking MiraLAX to help prevent constipation.  He denies any dizziness, confusion, or balance issues taking the pain medication.  He is not using the muscle relaxer.  Past medical history is significant for emphysema and chronic steroid use which would put the patient at high risk for osteoporosis.  He denies any other bone pain anywhere else in the body although multiple myeloma would certainly be on the differential diagnosis.  Fortunately there were no other lesions seen in the scan. Past Medical History:  Diagnosis Date  . Allergic rhinitis   . Anxiety   . Bronchiectasis   . CAD (coronary artery disease)   . Celiac disease   . Chronic heart failure ([place])   . Chronic kidney disease (CKD), stage III (moderate) (HCC)   .  COPD (chronic obstructive pulmonary disease) ([place])   . Diverticulosis of colon (without mention of hemorrhage)   . Esophageal reflux   . Family history of adverse reaction to anesthesia    daughter gets PONV  . Family hx of colon cancer   . History of stomach ulcers 1980s  . Hyperlipemia   . Hypertension   . 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 Myocardial infarction [facility])    "previous MI/echo in 12/2015"  . Nephrolithiasis    "got them now; never had OR/scopes" (02/03/2016)  . On home oxygen therapy    "3L-5L; 24/7" (12/29/2017)  . Other diseases of lung, not elsewhere  classified   . Personal history of colonic polyps 04/26/2007   hyperplastic   . Pneumonia "several times"   Past Surgical History:  Procedure Laterality Date  . CARDIAC CATHETERIZATION  02/03/2016  . CARDIAC CATHETERIZATION  09/30/2009   non-obstructive CAD w/30% narrowing in prox LAD (Dr. Waunita Schooner)  . CARDIAC CATHETERIZATION  05/04/2001   same at 2011 cath (Dr. Waunita Schooner)  . CARDIAC CATHETERIZATION N/A 02/03/2016   Procedure: Right/Left Heart Cath and Coronary Angiography;  Surgeon: Pixie Casino, MD;  Location: [facility];  Service: Cardiovascular;  Laterality: N/A;  . EPIGLOTOPLASTY W/ MLB     removal due to carcinoma  . EXCISIONAL HEMORRHOIDECTOMY  2000s  . HAND SURGERY Right 1958   d/t crush injury  . TONSILLECTOMY AND ADENOIDECTOMY    . ULTRASOUND GUIDANCE FOR VASCULAR ACCESS  02/03/2016   Procedure: Ultrasound Guidance For Vascular Access;  Surgeon: Pixie Casino, MD;  Location: [facility];  Service: Cardiovascular;;  . VASECTOMY     Current Outpatient Medications on File Prior to Visit  Medication Sig Dispense Refill  . albuterol (PROAIR HFA) 108 (90 Base) MCG/ACT inhaler INHALE 2 PUFFS EVERY 4 HOURS AS NEEDED FOR SHORTNESS OF BREATH. 2 Inhaler 5  . albuterol (PROVENTIL) (2.5 MG/3ML) 0.083% nebulizer solution Take 3 mLs (2.5 mg total) by nebulization every 4 (four) hours as needed for wheezing or shortness of breath. 75 mL 5  . ALPRAZolam (XANAX) 0.25 MG tablet Take 1 tablet (0.25 mg total) by mouth 2 (two) times daily as needed for anxiety. 20 tablet 0  . amiodarone (PACERONE) 200 MG tablet TAKE 1/2 TABLET BY MOUTH ONCE DAILY. 15 tablet 5  . aspirin EC 81 MG tablet Take 81 mg by mouth daily.    Marland Kitchen atorvastatin (LIPITOR) 20 MG tablet Take 1 tablet (20 mg total) by mouth daily. 90 tablet 3  . azelastine (ASTELIN) 0.1 % nasal spray PLACE 2 SPRAYS INTO EACH NOSTRILS TWICE DAILY AS DIRECTED. 30 mL 5  . budesonide-formoterol (SYMBICORT) 160-4.5 MCG/ACT inhaler  INHALE 2 PUFFS FIRST THING IN MORNING AND THEN ANOTHER 2 PUFFS ABOUT 12 HOURS LATER. 10.2 g 11  . Calcium Carbonate-Vitamin D (CALTRATE 600+D) 600-400 MG-UNIT tablet Take 1 tablet by mouth daily.     Marland Kitchen dextromethorphan-guaiFENesin (MUCINEX DM) 30-600 MG 12hr tablet Take 1 tablet by mouth 2 (two) times daily.    Marland Kitchen ENTRESTO 49-51 MG TAKE (1) TABLET BY MOUTH TWICE DAILY. 60 tablet 5  . HYDROcodone-homatropine (HYCODAN) 5-1.5 MG/5ML syrup Take 5 mLs by mouth every 6 (six) hours as needed for cough. 60 mL 0  . LUTEIN PO Take 1 capsule by mouth daily.    . montelukast (SINGULAIR) 10 MG tablet TAKE 1 TABLET BY MOUTH DAILY. 90 tablet 3  . Multiple Vitamins-Minerals (CENTRUM  SILVER 50+MEN) TABS Take 1 tablet by mouth daily.    . OXYGEN Inhale 3 L into the lungs continuous. 3lpm with rest and 6 lpm with exertion    . pantoprazole (PROTONIX) 40 MG tablet TAKE ONE TABLET BY MOUTH DAILY. (Patient taking differently: TAKE ONE TABLET (40 MG TOTALLY) BY MOUTH DAILY.) 90 tablet 3  . polyethylene glycol (MIRALAX / GLYCOLAX) packet Take 17 g by mouth daily as needed for mild constipation. Mix in 8 oz liquid and drink    . predniSONE (DELTASONE) 5 MG tablet Take 1 tablet (5 mg total) by mouth daily with breakfast. 30 tablet 5  . SPIRIVA RESPIMAT 2.5 MCG/ACT AERS INHALE 2 PUFFS INTO THE LUNGS ONCE DAILY. 4 g 11  . spironolactone (ALDACTONE) 25 MG tablet TAKE (1/2) TABLET BY MOUTH DAILY. 15 tablet 3  . tamsulosin (FLOMAX) 0.4 MG CAPS capsule TAKE ONE CAPSULE BY MOUTH ONCE DAILY. 90 capsule 3  . tiZANidine (ZANAFLEX) 4 MG tablet Take 1 tablet (4 mg total) by mouth every 6 (six) hours as needed for muscle spasms. (Patient not taking: Reported on 08/03/2018) 30 tablet 0   No current facility-administered medications on file prior to visit.    Allergies  Allergen Reactions  . Codeine Other (See Comments)    Thought head was going to blow off/ severe headache  . Adhesive [Tape] Other (See Comments)    Band-aids - tears  skin off  . Gluten Meal Other (See Comments)    Celiac disease   Social History   Socioeconomic History  . Marital status: Married    Spouse name: Not on file  . Number of children: 3  . Years of education: Not on file  . Highest education level: Not on file  Occupational History  . 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 Financial resource strain: Not on file  . Food insecurity:    Worry: Not on file    Inability: Not on file  . Transportation needs:    Medical: Not on file    Non-medical: Not on file  Tobacco Use  . Smoking status: Former Smoker    Packs/day: 2.00    Years: 35.00    Pack years: 70.00    Types: Cigarettes    Last attempt to quit: 01/21/1989    Years since quitting: 29.5  . Smokeless tobacco: Former Systems developer    Types: [place] date: 08/23/2001  Substance and Sexual Activity  . Alcohol use: No  . Drug use: No  . Sexual activity: Not on file  Lifestyle  . Physical activity:    Days per week: Not on file    Minutes per session: Not on file  . Stress: Not on file  Relationships  . Social connections:    Talks on phone: Not on file    Gets together: Not on file    Attends religious service: Not on file    Active member of club or organization: Not on file    Attends meetings of clubs or organizations: Not on file    Relationship status: Not on file  . Intimate partner violence:    Fear of current or ex partner: Not on file    Emotionally abused: Not on file    Physically abused: Not on file    Forced sexual activity: Not on file  Other Topics Concern  . Not on file  Social History Narrative  .  Not on file      Review of Systems  All other systems reviewed and are negative.      Objective:   Physical Exam Vitals signs reviewed.  Cardiovascular:     Rate and Rhythm: Normal rate and regular rhythm.     Heart sounds: Normal heart sounds. No murmur.  Pulmonary:     Effort: Pulmonary  effort is normal. No accessory muscle usage or respiratory distress.     Breath sounds: Decreased breath sounds, wheezing and rales present.    Abdominal:     General: Bowel sounds are normal. There is no distension.     Palpations: Abdomen is soft. There is no mass.     Tenderness: There is no abdominal tenderness. There is no guarding or rebound.           Assessment & Plan:  Closed wedge compression fracture of seventh thoracic vertebra with routine healing, subsequent encounter - Plan: DG Bone Density I believe this was a compression fracture likely due to osteoporosis due to longstanding prednisone use in the face of COPD.  I will treat the patient's pain with hydrocodone/acetaminophen 1 p.o. every 6 hours as needed pain.  I will give the patient 120 tablets which should be a sufficient amount to last more than 30 days but I encouraged the patient to use these sparingly and try to use them as little as possible.  We will also try calcitonin nasal spray to see if this will help some with the pain and lessen his need for narcotics.  I will schedule the patient for a bone density test to evaluate for osteoporosis and if present, we discussed options such as Prolia or Fosamax to prevent future fractures

## 2018-08-11 ENCOUNTER — Telehealth: Payer: Self-pay | Admitting: Family Medicine

## 2018-08-11 NOTE — Telephone Encounter (Signed)
Received a phone call from Pulmonary rehab asking for a new order so that patient may start Pulmonary Rehab again Once he is medically cleared.

## 2018-08-11 NOTE — Telephone Encounter (Signed)
Ok to resume rehab when pain is better.  He needs to call us when pain is better.

## 2018-08-11 NOTE — Telephone Encounter (Signed)
Spoke with patient and informed him to give [place] a call when he is feeling better so that we can get him rescheduled for pulmonary rehab. Patient verbalized understanding.

## 2018-08-25 ENCOUNTER — Other Ambulatory Visit: Payer: Self-pay | Admitting: Cardiology

## 2018-08-25 ENCOUNTER — Other Ambulatory Visit: Payer: Self-pay | Admitting: Family Medicine

## 2018-08-25 NOTE — Telephone Encounter (Signed)
Ok to refill??  Last office visit 08/03/2018.  Last refill 03/30/2018.

## 2018-08-31 ENCOUNTER — Other Ambulatory Visit: Payer: Self-pay | Admitting: Family Medicine

## 2018-08-31 MED ORDER — TIZANIDINE HCL 4 MG PO TABS: 4.0000 mg | ORAL_TABLET | Freq: Four times a day (QID) | ORAL | 0 refills | Status: DC | PRN

## 2018-08-31 NOTE — Telephone Encounter (Signed)
Ok to refill 

## 2018-09-07 ENCOUNTER — Encounter (HOSPITAL_COMMUNITY): Payer: Medicare Other | Admitting: Internal Medicine

## 2018-09-09 ENCOUNTER — Other Ambulatory Visit: Payer: Self-pay | Admitting: Family Medicine

## 2018-09-09 ENCOUNTER — Other Ambulatory Visit (HOSPITAL_COMMUNITY): Payer: Self-pay | Admitting: Internal Medicine

## 2018-09-11 ENCOUNTER — Encounter (HOSPITAL_COMMUNITY): Payer: Self-pay | Admitting: Internal Medicine

## 2018-09-11 ENCOUNTER — Ambulatory Visit (HOSPITAL_COMMUNITY)
Admission: RE | Admit: 2018-09-11 | Discharge: 2018-09-11 | Disposition: A | Discharge status: Home / Self Care | Payer: Medicare Other | Source: Ambulatory Visit | Attending: Internal Medicine | Admitting: Internal Medicine

## 2018-09-11 VITALS — BP 122/60 | HR 64 | Wt 129.4 lb

## 2018-09-11 DIAGNOSIS — Z8249 Family history of ischemic heart disease and other diseases of the circulatory system: Secondary | ICD-10-CM | POA: Diagnosis not present

## 2018-09-11 DIAGNOSIS — N183 Chronic kidney disease, stage 3 (moderate): Secondary | ICD-10-CM | POA: Diagnosis not present

## 2018-09-11 DIAGNOSIS — E785 Hyperlipidemia, unspecified: Secondary | ICD-10-CM | POA: Insufficient documentation

## 2018-09-11 DIAGNOSIS — Z800 Family history of malignant neoplasm of digestive organs: Secondary | ICD-10-CM | POA: Insufficient documentation

## 2018-09-11 DIAGNOSIS — K219 Gastro-esophageal reflux disease without esophagitis: Secondary | ICD-10-CM | POA: Diagnosis not present

## 2018-09-11 DIAGNOSIS — Z91018 Allergy to other foods: Secondary | ICD-10-CM | POA: Insufficient documentation

## 2018-09-11 DIAGNOSIS — I429 Cardiomyopathy, unspecified: Secondary | ICD-10-CM | POA: Insufficient documentation

## 2018-09-11 DIAGNOSIS — I4510 Unspecified right bundle-branch block: Secondary | ICD-10-CM | POA: Insufficient documentation

## 2018-09-11 DIAGNOSIS — Z888 Allergy status to other drugs, medicaments and biological substances status: Secondary | ICD-10-CM | POA: Insufficient documentation

## 2018-09-11 DIAGNOSIS — Z9981 Dependence on supplemental oxygen: Secondary | ICD-10-CM | POA: Diagnosis not present

## 2018-09-11 DIAGNOSIS — Z7952 Long term (current) use of systemic steroids: Secondary | ICD-10-CM | POA: Diagnosis not present

## 2018-09-11 DIAGNOSIS — I5022 Chronic systolic (congestive) heart failure: Secondary | ICD-10-CM | POA: Insufficient documentation

## 2018-09-11 DIAGNOSIS — J9610 Chronic respiratory failure, unspecified whether with hypoxia or hypercapnia: Secondary | ICD-10-CM | POA: Diagnosis not present

## 2018-09-11 DIAGNOSIS — Z79899 Other long term (current) drug therapy: Secondary | ICD-10-CM | POA: Insufficient documentation

## 2018-09-11 DIAGNOSIS — F419 Anxiety disorder, unspecified: Secondary | ICD-10-CM | POA: Insufficient documentation

## 2018-09-11 DIAGNOSIS — I2510 Atherosclerotic heart disease of native coronary artery without angina pectoris: Secondary | ICD-10-CM | POA: Insufficient documentation

## 2018-09-11 DIAGNOSIS — I5040 Unspecified combined systolic (congestive) and diastolic (congestive) heart failure: Secondary | ICD-10-CM | POA: Diagnosis not present

## 2018-09-11 DIAGNOSIS — I252 Old myocardial infarction: Secondary | ICD-10-CM | POA: Diagnosis not present

## 2018-09-11 DIAGNOSIS — Z885 Allergy status to narcotic agent status: Secondary | ICD-10-CM | POA: Insufficient documentation

## 2018-09-11 DIAGNOSIS — I493 Ventricular premature depolarization: Secondary | ICD-10-CM | POA: Diagnosis not present

## 2018-09-11 DIAGNOSIS — Z8521 Personal history of malignant neoplasm of larynx: Secondary | ICD-10-CM | POA: Insufficient documentation

## 2018-09-11 DIAGNOSIS — I130 Hypertensive heart and chronic kidney disease with heart failure and stage 1 through stage 4 chronic kidney disease, or unspecified chronic kidney disease: Secondary | ICD-10-CM | POA: Diagnosis present

## 2018-09-11 DIAGNOSIS — J449 Chronic obstructive pulmonary disease, unspecified: Secondary | ICD-10-CM | POA: Diagnosis not present

## 2018-09-11 DIAGNOSIS — Z8042 Family history of malignant neoplasm of prostate: Secondary | ICD-10-CM | POA: Diagnosis not present

## 2018-09-11 DIAGNOSIS — R9431 Abnormal electrocardiogram [ECG] [EKG]: Secondary | ICD-10-CM | POA: Diagnosis present

## 2018-09-11 DIAGNOSIS — Z7982 Long term (current) use of aspirin: Secondary | ICD-10-CM | POA: Insufficient documentation

## 2018-09-11 LAB — BASIC METABOLIC PANEL
Anion gap: 10 (ref 5–15)
BUN: 19 mg/dL (ref 8–23)
CALCIUM: 9 mg/dL (ref 8.9–10.3)
CHLORIDE: 107 mmol/L (ref 98–111)
CO2: 22 mmol/L (ref 22–32)
Creatinine, Ser: 1.2 mg/dL (ref 0.61–1.24)
GFR, EST NON AFRICAN AMERICAN: 56 mL/min — AB (ref >60)
Glucose, Bld: 108 mg/dL — ABNORMAL HIGH (ref 70–99)
Potassium: 4.6 mmol/L (ref 3.5–5.1)
Sodium: 139 mmol/L (ref 135–145)

## 2018-09-11 LAB — HEPATIC FUNCTION PANEL
ALBUMIN: 3.5 g/dL (ref 3.5–5.0)
ALT: 26 U/L (ref 0–44)
AST: 22 U/L (ref 15–41)
Alkaline Phosphatase: 62 U/L (ref 38–126)
BILIRUBIN TOTAL: 0.7 mg/dL (ref 0.3–1.2)
Bilirubin, Direct: 0.2 mg/dL (ref 0.0–0.2)
Indirect Bilirubin: 0.5 mg/dL (ref 0.3–0.9)
TOTAL PROTEIN: 5.4 g/dL — AB (ref 6.5–8.1)

## 2018-09-11 MED ORDER — HYDROCODONE-ACETAMINOPHEN 5-325 MG PO TABS: 1.0000 | ORAL_TABLET | Freq: Four times a day (QID) | ORAL | 0 refills | Status: DC | PRN

## 2018-09-11 MED ORDER — SPIRONOLACTONE 25 MG PO TABS: ORAL_TABLET | ORAL | 0 refills | Status: DC

## 2018-09-11 NOTE — Patient Instructions (Addendum)
EKG was done.  Labs were done today. We will call you with any ABNORMAL results. No news is good news!  Your physician wants you to follow-up in: 6 MONTHS. You will receive a reminder letter in the mail two months in advance. If you don't receive a letter, please call our office to schedule the follow-up appointment.

## 2018-09-11 NOTE — Progress Notes (Signed)
Patient ID: Bruce Travis, male   DOB: 1935/02/02, 84 y.o.   MRN: [id number]   ADVANCED HF CLINIC NOTE  Chief Complaint:  PAC's and PVC's, abnormal ekg, DOE  Primary Care Physician: Susy Frizzle, MD  HPI:  CAMMERON Travis is a 84 y.o. male with COPD (O2 dependent). HTN, HL and systolic HF fleto to be related to frequent PVCs.   Admitted in June 2017 with acute HF. Echo with EF 25%. Cath with essentially normal coronaries. EF 20-25%. Low filling pressures but CO 2.5/1.5. Started on milrinone. 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 Started on amiodarone. PVCs went from ~20/minute to 1-2/minute by time of discharge. Milrinone weaned off.   Echo 04/12/16 LVEF 50-55%, Trivial AI, Mild MR, Mild LAE, PA peak pressure 46 mm Hg - much improved from previous.   Admitted 12/18 with PNA.   Admitted 12/2017 with hypoxia due to COPD PNA.   Mr Bruce Travis presents today for regular follow up. He is here with his son who is a Engineer, drilling. Remains on amio 100 daily. Has had several hospitalizations this year for COPD. Has been following with Dr. Melvyn Novas and there has been concern over risk of amio pulmonary toxicity with poor lung reserve. Feels good. Going to Pulmonary rehab, Wears 2L O2 at rest up to 6L with exercise. Doing all ADLs and walking at [place]. Very active. No edema, orthopnea or PND or palpitations. In November had a severe coughing spell and had T7 compression fracture. CT chest reviewed. No evidence of pulmonary pneumonitis  Echo 11/2017: EF 50-55% Personally reviewed  PMHx:  Past Medical History:  Diagnosis Date  . Allergic rhinitis   . Anxiety   . Bronchiectasis   . CAD (coronary artery disease)   . Celiac disease   . Chronic heart failure ([place])   . Chronic kidney disease (CKD), stage III (moderate) (HCC)   . COPD (chronic obstructive pulmonary disease) ([place])   . Diverticulosis of colon (without mention of hemorrhage)   . Esophageal reflux   . Family history of adverse reaction to anesthesia    daughter gets PONV  . Family hx of colon cancer   . History of stomach ulcers 1980s  . Hyperlipemia   . Hypertension   . 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 Myocardial infarction [facility])    "previous MI/echo in 12/2015"  . Nephrolithiasis    "got them now; never had OR/scopes" (02/03/2016)  . On home oxygen therapy    "3L-5L; 24/7" (12/29/2017)  . Other diseases of lung, not elsewhere classified   . Personal history of colonic polyps 04/26/2007   hyperplastic   . Pneumonia "several times"    Past Surgical History:  Procedure Laterality Date  . CARDIAC CATHETERIZATION  02/03/2016  . CARDIAC CATHETERIZATION  09/30/2009   non-obstructive CAD w/30% narrowing in prox LAD (Dr. Waunita Schooner)  . CARDIAC CATHETERIZATION  05/04/2001   same at 2011 cath (Dr. Waunita Schooner)  . CARDIAC CATHETERIZATION N/A 02/03/2016   Procedure: Right/Left Heart Cath and Coronary Angiography;  Surgeon: Pixie Casino, MD;  Location: [facility];  Service: Cardiovascular;  Laterality: N/A;  . EPIGLOTOPLASTY W/ MLB     removal due to carcinoma  . EXCISIONAL HEMORRHOIDECTOMY  2000s  . HAND SURGERY Right 1958   d/t crush injury  . TONSILLECTOMY AND ADENOIDECTOMY    . ULTRASOUND GUIDANCE FOR VASCULAR ACCESS  02/03/2016   Procedure: Ultrasound Guidance For Vascular Access;  Surgeon: Nadean Corwin  Hilty, MD;  Location: [facility];  Service: Cardiovascular;;  . VASECTOMY      FAMHx:  Family History  Problem Relation Age of Onset  . Heart disease Father   . Colon cancer Father   . Prostate cancer Father   . Stroke Mother   . Endometrial cancer Sister   . Stroke Maternal Grandmother   . Cancer Paternal Grandmother     SOCHx:   reports that he quit smoking about 29 years ago. His smoking use included cigarettes. He has a 70.00 pack-year smoking history. He quit smokeless tobacco use about 17 years ago.  His smokeless tobacco use included chew. He reports that he does not drink  alcohol or use drugs.  ALLERGIES:  Allergies  Allergen Reactions  . Codeine Other (See Comments)    Thought head was going to blow off/ severe headache  . Adhesive [Tape] Other (See Comments)    Band-aids - tears skin off  . Gluten Meal Other (See Comments)    Celiac disease    ROS: Pertinent items noted in HPI and remainder of comprehensive ROS otherwise negative.  HOME MEDS: Current Outpatient Medications  Medication Sig Dispense Refill  . albuterol (PROAIR HFA) 108 (90 Base) MCG/ACT inhaler INHALE 2 PUFFS EVERY 4 HOURS AS NEEDED FOR SHORTNESS OF BREATH. 2 Inhaler 5  . ALPRAZolam (XANAX) 0.25 MG tablet TAKE ONE TABLET BY MOUTH TWICE A DAY AS NEEDED FOR ANXIETY. 20 tablet 0  . amiodarone (PACERONE) 200 MG tablet TAKE 1/2 TABLET BY MOUTH ONCE DAILY. 15 tablet 3  . aspirin EC 81 MG tablet Take 81 mg by mouth daily.    Marland Kitchen atorvastatin (LIPITOR) 20 MG tablet Take 1 tablet (20 mg total) by mouth daily. 90 tablet 3  . azelastine (ASTELIN) 0.1 % nasal spray PLACE 2 SPRAYS INTO EACH NOSTRILS TWICE DAILY AS DIRECTED. 30 mL 5  . budesonide-formoterol (SYMBICORT) 160-4.5 MCG/ACT inhaler INHALE 2 PUFFS FIRST THING IN MORNING AND THEN ANOTHER 2 PUFFS ABOUT 12 HOURS LATER. 10.2 g 11  . calcitonin, salmon, (MIACALCIN) 200 UNIT/ACT nasal spray Place 1 spray into alternate nostrils daily. 3.7 mL 12  . Calcium Carbonate-Vitamin D (CALTRATE 600+D) 600-400 MG-UNIT tablet Take 1 tablet by mouth daily.     Marland Kitchen dextromethorphan-guaiFENesin (MUCINEX DM) 30-600 MG 12hr tablet Take 1 tablet by mouth 2 (two) times daily.    Marland Kitchen ENTRESTO 49-51 MG TAKE (1) TABLET BY MOUTH TWICE DAILY. 60 tablet 5  . HYDROcodone-acetaminophen (NORCO) 5-325 MG tablet Take 1 tablet by mouth every 6 (six) hours as needed for moderate pain. 120 tablet 0  . HYDROcodone-homatropine (HYCODAN) 5-1.5 MG/5ML syrup Take 5 mLs by mouth every 6 (six) hours as needed for cough. 60 mL 0  . LUTEIN PO Take 1 capsule by mouth daily.    . montelukast  (SINGULAIR) 10 MG tablet TAKE 1 TABLET BY MOUTH DAILY. 90 tablet 3  . Multiple Vitamins-Minerals (CENTRUM SILVER 50+MEN) TABS Take 1 tablet by mouth daily.    . OXYGEN Inhale 3 L into the lungs continuous. 3lpm with rest and 6 lpm with exertion    . pantoprazole (PROTONIX) 40 MG tablet TAKE ONE TABLET BY MOUTH DAILY. (Patient taking differently: TAKE ONE TABLET (40 MG TOTALLY) BY MOUTH DAILY.) 90 tablet 3  . polyethylene glycol (MIRALAX / GLYCOLAX) packet Take 17 g by mouth daily as needed for mild constipation. Mix in 8 oz liquid and drink    . predniSONE (DELTASONE) 5 MG tablet Take 1 tablet (  5 mg total) by mouth daily with breakfast. 30 tablet 5  . SPIRIVA RESPIMAT 2.5 MCG/ACT AERS INHALE 2 PUFFS INTO THE LUNGS ONCE DAILY. 4 g 11  . spironolactone (ALDACTONE) 25 MG tablet TAKE (1/2) TABLET BY MOUTH DAILY. 15 tablet 0  . tamsulosin (FLOMAX) 0.4 MG CAPS capsule TAKE ONE CAPSULE BY MOUTH ONCE DAILY. 90 capsule 3  . tiZANidine (ZANAFLEX) 4 MG tablet Take 1 tablet (4 mg total) by mouth every 6 (six) hours as needed for muscle spasms. 30 tablet 0  . albuterol (PROVENTIL) (2.5 MG/3ML) 0.083% nebulizer solution Take 3 mLs (2.5 mg total) by nebulization every 4 (four) hours as needed for wheezing or shortness of breath. (Patient not taking: Reported on 09/11/2018) 75 mL 5   No current facility-administered medications for this encounter.     LABS/IMAGING: No results found for this or any previous visit (from the past 48 hour(s)). No results found.  WEIGHTS: Wt Readings from Last 3 Encounters:  09/11/18 58.7 kg (129 lb 6.4 oz)  08/03/18 58.5 kg (129 lb)  07/25/18 60.3 kg (133 lb)    VITALS: BP 122/60   Pulse 64   Wt 58.7 kg (129 lb 6.4 oz)   SpO2 95% Comment: 3L  BMI 20.57 kg/m   Wt Readings from Last 3 Encounters:  09/11/18 58.7 kg (129 lb 6.4 oz)  08/03/18 58.5 kg (129 lb)  07/25/18 60.3 kg (133 lb)     EXAM: General:  Elderly thin. Wearing O2. No resp difficulty HEENT:  normal Neck: supple. JVP 6 Carotids 2+ bilat; no bruits. No lymphadenopathy or thryomegaly appreciated. Cor: PMI nondisplaced. Regular rate & rhythm. No rubs, gallops or murmurs. Lungs: clear with decreased breath sounds throughout Abdomen: soft, nontender, nondistended. No hepatosplenomegaly. No bruits or masses. Good bowel sounds. Extremities: no cyanosis, clubbing, rash, edema Neuro: alert & orientedx3, cranial nerves grossly intact. moves all 4 extremities w/o difficulty. Affect pleasant   ECG NSR with RBBB 1 PVC Personally reviewed  ASSESSMENT: 1. 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 Cath with essentially normal coronaries    -EF now normal with PVC suppression on amio    -Echo 04/12/16 LVEF 50-55%, Trivial AI, Mild MR, Mild LAE, PA peak pressure 46 mm Hg. Echo 10/18 with EF 55%.   - Doing well. NYHA II Stable. Volume status stable.   - Continue Entresto 49/51 and spiro 12.5 mg daily.  Will not uptitrate with age and borderline hyperkalemia. Check labs today    - No b-blocker with severe COPD   2. PVC related cardiomyopathy   -PVCs suppressed with amio with normalization of LV function. amio now 172m daily. Will continue   - Agree with concern for risk of amio toxicity given the severity of his COPD. That said risk of lung toxicity on amio 100 daily is very low and he was very ill with severe PVC cardiomyopathy. Not good candidate for PVC ablation. I discussed with Dr. ARayann Hemanin EP and we both agree that risk of stopping amio outweighs risk of continuing low-dose amio. I discussed this with him and his son today and they agree. If he does develop toxicity could consider switch to mexilitene   -check TSH, LFTs. Recent eye exam 1/18 was ok.   3. Chronic respiratory failure due to COPD on home O2 - Follows with Dr WMelvyn Novas   DGlori Bickers MD  12:47 PM

## 2018-09-11 NOTE — Telephone Encounter (Signed)
Ok to refill??  Last office visit/  refill 08/03/2018.

## 2018-09-20 ENCOUNTER — Other Ambulatory Visit: Payer: Self-pay | Admitting: Family Medicine

## 2018-09-20 MED ORDER — HYDROCODONE-HOMATROPINE 5-1.5 MG/5ML PO SYRP: 5.0000 mL | ORAL_SOLUTION | Freq: Four times a day (QID) | ORAL | 0 refills | Status: DC | PRN

## 2018-09-20 NOTE — Telephone Encounter (Signed)
Pt called and he has a cold with a cough- he has been taking otc meds - he would like to get his cough syrup refilled (RX set up ready to be sent). Informed pt that if he got worse to call us back - pt verbalized understanding.

## 2018-09-20 NOTE — Progress Notes (Signed)
  Cough meds appropriate refill for pt with chronic respiratory dx and symptoms, want pt to avoid coming into office so he will not get ill by coming here, sx are currently consistent with his baseline per nurse - meds refilled and sent to pharmacy

## 2018-09-25 ENCOUNTER — Encounter: Payer: Self-pay | Admitting: Family Medicine

## 2018-09-25 ENCOUNTER — Ambulatory Visit: Payer: Medicare Other | Admitting: Family Medicine

## 2018-09-25 VITALS — BP 130/70 | HR 77 | Temp 97.9°F | Resp 20 | Ht 66.5 in | Wt 127.0 lb

## 2018-09-25 DIAGNOSIS — J441 Chronic obstructive pulmonary disease with (acute) exacerbation: Secondary | ICD-10-CM

## 2018-09-25 MED ORDER — METHYLPREDNISOLONE ACETATE 80 MG/ML IJ SUSP
80.0000 mg | Freq: Once | INTRAMUSCULAR | Status: AC
  Administered 2018-09-25: 80 mg via INTRAMUSCULAR

## 2018-09-25 MED ORDER — DOXYCYCLINE HYCLATE 100 MG PO TABS: 100.0000 mg | ORAL_TABLET | Freq: Two times a day (BID) | ORAL | 0 refills | Status: DC

## 2018-09-25 MED ORDER — PREDNISONE 20 MG PO TABS: 60.0000 mg | ORAL_TABLET | Freq: Every day | ORAL | 0 refills | Status: DC

## 2018-09-25 NOTE — Progress Notes (Signed)
Subjective:    Patient ID: Bruce Travis, male    DOB: 01/25/1935, 84 y.o.   MRN: [id number]  HPI  Patient has been sick for 7 to 10 days.  Symptoms began as a cold with rhinorrhea and head congestion.  It has since exacerbated his COPD.  He is having to use his nebulizer 4 times a day.  He is struggling to keep his oxygen above 92 despite being on oxygen.  He is wheezing.  He reports chest congestion.  Cough is productive of white and gray sputum.  He typically does not have any sputum production.  He denies any fevers or chills.  He denies any chest pain or pleurisy.  On exam today, he has diminished breath sounds bilaterally with diffuse expiratory wheezing that is faint and difficult to hear.  He has rhonchorous breath sounds but no crackles/Rales that I can appreciate. Past Medical History:  Diagnosis Date  . Allergic rhinitis   . Anxiety   . Bronchiectasis   . CAD (coronary artery disease)   . Celiac disease   . Chronic heart failure ([place])   . Chronic kidney disease (CKD), stage III (moderate) (HCC)   . COPD (chronic obstructive pulmonary disease) ([place])   . Diverticulosis of colon (without mention of hemorrhage)   . Esophageal reflux   . Family history of adverse reaction to anesthesia    daughter gets PONV  . Family hx of colon cancer   . History of stomach ulcers 1980s  . Hyperlipemia   . Hypertension   . 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 Myocardial infarction [facility])    "previous MI/echo in 12/2015"  . Nephrolithiasis    "got them now; never had OR/scopes" (02/03/2016)  . On home oxygen therapy    "3L-5L; 24/7" (12/29/2017)  . Other diseases of lung, not elsewhere classified   . Personal history of colonic polyps 04/26/2007   hyperplastic   . Pneumonia "several times"   Past Surgical History:  Procedure Laterality Date  . CARDIAC CATHETERIZATION  02/03/2016  . CARDIAC CATHETERIZATION  09/30/2009   non-obstructive CAD w/30% narrowing in prox LAD  (Dr. Waunita Schooner)  . CARDIAC CATHETERIZATION  05/04/2001   same at 2011 cath (Dr. Waunita Schooner)  . CARDIAC CATHETERIZATION N/A 02/03/2016   Procedure: Right/Left Heart Cath and Coronary Angiography;  Surgeon: Pixie Casino, MD;  Location: [place] [facility];  Service: Cardiovascular;  Laterality: N/A;  . EPIGLOTOPLASTY W/ MLB     removal due to carcinoma  . EXCISIONAL HEMORRHOIDECTOMY  2000s  . HAND SURGERY Right 1958   d/t crush injury  . TONSILLECTOMY AND ADENOIDECTOMY    . ULTRASOUND GUIDANCE FOR VASCULAR ACCESS  02/03/2016   Procedure: Ultrasound Guidance For Vascular Access;  Surgeon: Pixie Casino, MD;  Location: [place] [facility];  Service: Cardiovascular;;  . VASECTOMY     Current Outpatient Medications on File Prior to Visit  Medication Sig Dispense Refill  . albuterol (PROAIR HFA) 108 (90 Base) MCG/ACT inhaler INHALE 2 PUFFS EVERY 4 HOURS AS NEEDED FOR SHORTNESS OF BREATH. 2 Inhaler 5  . albuterol (PROVENTIL) (2.5 MG/3ML) 0.083% nebulizer solution Take 3 mLs (2.5 mg total) by nebulization every 4 (four) hours as needed for wheezing or shortness of breath. 75 mL 5  . ALPRAZolam (XANAX) 0.25 MG tablet TAKE ONE TABLET BY MOUTH TWICE A DAY AS NEEDED FOR ANXIETY. 20 tablet 0  . amiodarone (PACERONE) 200 MG tablet TAKE  1/2 TABLET BY MOUTH ONCE DAILY. 15 tablet 3  . aspirin EC 81 MG tablet Take 81 mg by mouth daily.    Marland Kitchen atorvastatin (LIPITOR) 20 MG tablet Take 1 tablet (20 mg total) by mouth daily. 90 tablet 3  . azelastine (ASTELIN) 0.1 % nasal spray PLACE 2 SPRAYS INTO EACH NOSTRILS TWICE DAILY AS DIRECTED. 30 mL 5  . budesonide-formoterol (SYMBICORT) 160-4.5 MCG/ACT inhaler INHALE 2 PUFFS FIRST THING IN MORNING AND THEN ANOTHER 2 PUFFS ABOUT 12 HOURS LATER. 10.2 g 11  . calcitonin, salmon, (MIACALCIN) 200 UNIT/ACT nasal spray Place 1 spray into alternate nostrils daily. 3.7 mL 12  . Calcium Carbonate-Vitamin D (CALTRATE 600+D) 600-400 MG-UNIT tablet Take 1 tablet by mouth daily.     Marland Kitchen  dextromethorphan-guaiFENesin (MUCINEX DM) 30-600 MG 12hr tablet Take 1 tablet by mouth 2 (two) times daily.    Marland Kitchen ENTRESTO 49-51 MG TAKE (1) TABLET BY MOUTH TWICE DAILY. 60 tablet 5  . Guaifenesin 1200 MG TB12 Take by mouth.    Marland Kitchen HYDROcodone-acetaminophen (NORCO) 5-325 MG tablet Take 1 tablet by mouth every 6 (six) hours as needed for moderate pain. 120 tablet 0  . HYDROcodone-homatropine (HYCODAN) 5-1.5 MG/5ML syrup Take 5 mLs by mouth every 6 (six) hours as needed for cough. 120 mL 0  . LUTEIN PO Take 1 capsule by mouth daily.    . montelukast (SINGULAIR) 10 MG tablet TAKE 1 TABLET BY MOUTH DAILY. 90 tablet 3  . Multiple Vitamins-Minerals (CENTRUM SILVER 50+MEN) TABS Take 1 tablet by mouth daily.    . OXYGEN Inhale 3 L into the lungs continuous. 3lpm with rest and 6 lpm with exertion    . pantoprazole (PROTONIX) 40 MG tablet TAKE ONE TABLET BY MOUTH DAILY. (Patient taking differently: TAKE ONE TABLET (40 MG TOTALLY) BY MOUTH DAILY.) 90 tablet 3  . polyethylene glycol (MIRALAX / GLYCOLAX) packet Take 17 g by mouth daily as needed for mild constipation. Mix in 8 oz liquid and drink    . predniSONE (DELTASONE) 5 MG tablet Take 1 tablet (5 mg total) by mouth daily with breakfast. 30 tablet 5  . SPIRIVA RESPIMAT 2.5 MCG/ACT AERS INHALE 2 PUFFS INTO THE LUNGS ONCE DAILY. 4 g 11  . spironolactone (ALDACTONE) 25 MG tablet TAKE (1/2) TABLET BY MOUTH DAILY. 15 tablet 0  . tamsulosin (FLOMAX) 0.4 MG CAPS capsule TAKE ONE CAPSULE BY MOUTH ONCE DAILY. 90 capsule 3  . tiZANidine (ZANAFLEX) 4 MG tablet Take 1 tablet (4 mg total) by mouth every 6 (six) hours as needed for muscle spasms. 30 tablet 0   No current facility-administered medications on file prior to visit.    Allergies  Allergen Reactions  . Codeine Other (See Comments)    Thought head was going to blow off/ severe headache  . Adhesive [Tape] Other (See Comments)    Band-aids - tears skin off  . Gluten Meal Other (See Comments)    Celiac  disease   Social History   Socioeconomic History  . Marital status: Married    Spouse name: Not on file  . Number of children: 3  . Years of education: Not on file  . Highest education level: Not on file  Occupational History  . 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 Financial resource strain: Not on file  . Food insecurity:    Worry: Not on file    Inability: Not on file  .  Transportation needs:    Medical: Not on file    Non-medical: Not on file  Tobacco Use  . Smoking status: Former Smoker    Packs/day: 2.00    Years: 35.00    Pack years: 70.00    Types: Cigarettes    Last attempt to quit: 01/21/1989    Years since quitting: 29.6  . Smokeless tobacco: Former Systems developer    Types: [place] date: 08/23/2001  Substance and Sexual Activity  . Alcohol use: No  . Drug use: No  . Sexual activity: Not on file  Lifestyle  . Physical activity:    Days per week: Not on file    Minutes per session: Not on file  . Stress: Not on file  Relationships  . Social connections:    Talks on phone: Not on file    Gets together: Not on file    Attends religious service: Not on file    Active member of club or organization: Not on file    Attends meetings of clubs or organizations: Not on file    Relationship status: Not on file  . Intimate partner violence:    Fear of current or ex partner: Not on file    Emotionally abused: Not on file    Physically abused: Not on file    Forced sexual activity: Not on file  Other Topics Concern  . Not on file  Social History Narrative  . Not on file      Review of Systems  All other systems reviewed and are negative.      Objective:   Physical Exam  HENT:  Right Ear: Tympanic membrane and ear canal normal.  Left Ear: Tympanic membrane and ear canal normal.  Nose: No mucosal edema or rhinorrhea.  Cardiovascular: Normal rate, regular rhythm and normal heart sounds.  No murmur  heard. Pulmonary/Chest: Effort normal. No respiratory distress. He has decreased breath sounds. He has wheezes. He has rhonchi. He has no rales. He exhibits no tenderness.  Abdominal: Soft. Bowel sounds are normal.  Musculoskeletal:        General: No edema.  Vitals reviewed.        Assessment & Plan:   COPD with acute exacerbation (HCC)  Recommended prednisone 60 mg a day and doxycycline 100 mg p.o. twice daily for COPD exacerbation.  Use albuterol neb every 4-6 hours as needed for wheezing.  Continue home oxygen.  Recheck in 48 hours or sooner if worsening.  Patient received Depo-Medrol 80 mg IM x1 now.  Seek medical attention immediately if breathing worsens

## 2018-09-25 NOTE — Addendum Note (Signed)
Addended by: Shary Decamp B on: 09/25/2018 05:03 PM   Modules accepted: Orders

## 2018-09-26 ENCOUNTER — Other Ambulatory Visit: Payer: Self-pay | Admitting: Family Medicine

## 2018-09-26 ENCOUNTER — Telehealth: Payer: Self-pay | Admitting: Family Medicine

## 2018-09-26 NOTE — Telephone Encounter (Signed)
Just 60

## 2018-09-26 NOTE — Telephone Encounter (Signed)
Pt aware.

## 2018-09-26 NOTE — Telephone Encounter (Signed)
Pt called in asking since he was prescribed 32m of prednisone should he continue doing the 565min addition to the 6073mr just 77m26mly.  Please advise.

## 2018-09-27 ENCOUNTER — Other Ambulatory Visit: Payer: Medicare Other

## 2018-09-29 ENCOUNTER — Telehealth: Payer: Self-pay | Admitting: Family Medicine

## 2018-09-29 NOTE — Telephone Encounter (Signed)
Susy Frizzle, MD  Shary Decamp B, RMA        Starting Friday, decrease prednisone to 40 mg a day for 5 days, then 20 mg a day for 3 days, then 10 mg a day for 3 days, then resume 5 mg a day   Previous Messages    ----- Message -----  From: Alyson Locket, RMA  Sent: 09/27/2018 10:39 AM EST  To: Susy Frizzle, MD   [place] called at 8:30 this morning to tell you he was doing better. Less mucus and less coughing.      Pt aware of recommendations

## 2018-10-02 ENCOUNTER — Other Ambulatory Visit: Payer: Self-pay | Admitting: Family Medicine

## 2018-10-02 DIAGNOSIS — J449 Chronic obstructive pulmonary disease, unspecified: Secondary | ICD-10-CM

## 2018-10-03 ENCOUNTER — Ambulatory Visit (HOSPITAL_COMMUNITY): Payer: Medicare Other

## 2018-10-05 ENCOUNTER — Other Ambulatory Visit (HOSPITAL_COMMUNITY): Payer: Self-pay | Admitting: Internal Medicine

## 2018-10-05 ENCOUNTER — Ambulatory Visit (HOSPITAL_COMMUNITY): Payer: Medicare Other

## 2018-10-10 ENCOUNTER — Ambulatory Visit (HOSPITAL_COMMUNITY): Payer: Medicare Other

## 2018-10-12 ENCOUNTER — Other Ambulatory Visit: Payer: Self-pay

## 2018-10-12 ENCOUNTER — Ambulatory Visit (HOSPITAL_COMMUNITY): Payer: Medicare Other

## 2018-10-12 ENCOUNTER — Inpatient Hospital Stay (HOSPITAL_COMMUNITY)
Admission: EM | Admit: 2018-10-12 | Discharge: 2018-10-18 | DRG: 871 | Disposition: A | Discharge status: Skilled Nursing Facility | Payer: Medicare Other | Source: Ambulatory Visit | Attending: Internal Medicine | Admitting: Internal Medicine

## 2018-10-12 ENCOUNTER — Encounter (HOSPITAL_COMMUNITY): Payer: Self-pay

## 2018-10-12 ENCOUNTER — Ambulatory Visit (INDEPENDENT_AMBULATORY_CARE_PROVIDER_SITE_OTHER): Payer: Medicare Other | Admitting: Family Medicine

## 2018-10-12 ENCOUNTER — Emergency Department (HOSPITAL_COMMUNITY): Payer: Medicare Other

## 2018-10-12 VITALS — BP 162/80 | HR 114 | Temp 98.1°F | Resp 24

## 2018-10-12 DIAGNOSIS — Z885 Allergy status to narcotic agent status: Secondary | ICD-10-CM

## 2018-10-12 DIAGNOSIS — J9601 Acute respiratory failure with hypoxia: Secondary | ICD-10-CM | POA: Diagnosis not present

## 2018-10-12 DIAGNOSIS — I493 Ventricular premature depolarization: Secondary | ICD-10-CM | POA: Diagnosis present

## 2018-10-12 DIAGNOSIS — Z8521 Personal history of malignant neoplasm of larynx: Secondary | ICD-10-CM

## 2018-10-12 DIAGNOSIS — K900 Celiac disease: Secondary | ICD-10-CM | POA: Diagnosis present

## 2018-10-12 DIAGNOSIS — E785 Hyperlipidemia, unspecified: Secondary | ICD-10-CM | POA: Diagnosis present

## 2018-10-12 DIAGNOSIS — J181 Lobar pneumonia, unspecified organism: Secondary | ICD-10-CM

## 2018-10-12 DIAGNOSIS — I452 Bifascicular block: Secondary | ICD-10-CM | POA: Diagnosis present

## 2018-10-12 DIAGNOSIS — Z91048 Other nonmedicinal substance allergy status: Secondary | ICD-10-CM

## 2018-10-12 DIAGNOSIS — Z7952 Long term (current) use of systemic steroids: Secondary | ICD-10-CM

## 2018-10-12 DIAGNOSIS — Z8249 Family history of ischemic heart disease and other diseases of the circulatory system: Secondary | ICD-10-CM

## 2018-10-12 DIAGNOSIS — Z87891 Personal history of nicotine dependence: Secondary | ICD-10-CM

## 2018-10-12 DIAGNOSIS — J441 Chronic obstructive pulmonary disease with (acute) exacerbation: Secondary | ICD-10-CM | POA: Diagnosis present

## 2018-10-12 DIAGNOSIS — Z66 Do not resuscitate: Secondary | ICD-10-CM | POA: Diagnosis present

## 2018-10-12 DIAGNOSIS — Z7951 Long term (current) use of inhaled steroids: Secondary | ICD-10-CM

## 2018-10-12 DIAGNOSIS — J9621 Acute and chronic respiratory failure with hypoxia: Secondary | ICD-10-CM | POA: Diagnosis present

## 2018-10-12 DIAGNOSIS — Z800 Family history of malignant neoplasm of digestive organs: Secondary | ICD-10-CM

## 2018-10-12 DIAGNOSIS — Z9981 Dependence on supplemental oxygen: Secondary | ICD-10-CM | POA: Diagnosis not present

## 2018-10-12 DIAGNOSIS — K219 Gastro-esophageal reflux disease without esophagitis: Secondary | ICD-10-CM | POA: Diagnosis present

## 2018-10-12 DIAGNOSIS — R64 Cachexia: Secondary | ICD-10-CM | POA: Diagnosis present

## 2018-10-12 DIAGNOSIS — I2510 Atherosclerotic heart disease of native coronary artery without angina pectoris: Secondary | ICD-10-CM | POA: Diagnosis present

## 2018-10-12 DIAGNOSIS — J8410 Pulmonary fibrosis, unspecified: Secondary | ICD-10-CM | POA: Diagnosis present

## 2018-10-12 DIAGNOSIS — I130 Hypertensive heart and chronic kidney disease with heart failure and stage 1 through stage 4 chronic kidney disease, or unspecified chronic kidney disease: Secondary | ICD-10-CM | POA: Diagnosis present

## 2018-10-12 DIAGNOSIS — J9611 Chronic respiratory failure with hypoxia: Secondary | ICD-10-CM | POA: Diagnosis present

## 2018-10-12 DIAGNOSIS — Z7982 Long term (current) use of aspirin: Secondary | ICD-10-CM

## 2018-10-12 DIAGNOSIS — F419 Anxiety disorder, unspecified: Secondary | ICD-10-CM | POA: Diagnosis present

## 2018-10-12 DIAGNOSIS — N400 Enlarged prostate without lower urinary tract symptoms: Secondary | ICD-10-CM | POA: Diagnosis present

## 2018-10-12 DIAGNOSIS — Z6820 Body mass index (BMI) 20.0-20.9, adult: Secondary | ICD-10-CM | POA: Diagnosis not present

## 2018-10-12 DIAGNOSIS — J189 Pneumonia, unspecified organism: Secondary | ICD-10-CM | POA: Diagnosis not present

## 2018-10-12 DIAGNOSIS — N183 Chronic kidney disease, stage 3 unspecified: Secondary | ICD-10-CM | POA: Diagnosis present

## 2018-10-12 DIAGNOSIS — I255 Ischemic cardiomyopathy: Secondary | ICD-10-CM | POA: Diagnosis not present

## 2018-10-12 DIAGNOSIS — Z823 Family history of stroke: Secondary | ICD-10-CM

## 2018-10-12 DIAGNOSIS — Z79899 Other long term (current) drug therapy: Secondary | ICD-10-CM

## 2018-10-12 DIAGNOSIS — Z8042 Family history of malignant neoplasm of prostate: Secondary | ICD-10-CM

## 2018-10-12 DIAGNOSIS — J440 Chronic obstructive pulmonary disease with acute lower respiratory infection: Secondary | ICD-10-CM | POA: Diagnosis present

## 2018-10-12 DIAGNOSIS — A419 Sepsis, unspecified organism: Secondary | ICD-10-CM | POA: Diagnosis present

## 2018-10-12 DIAGNOSIS — Z9049 Acquired absence of other specified parts of digestive tract: Secondary | ICD-10-CM

## 2018-10-12 DIAGNOSIS — I252 Old myocardial infarction: Secondary | ICD-10-CM

## 2018-10-12 DIAGNOSIS — D6959 Other secondary thrombocytopenia: Secondary | ICD-10-CM | POA: Diagnosis present

## 2018-10-12 DIAGNOSIS — I5042 Chronic combined systolic (congestive) and diastolic (congestive) heart failure: Secondary | ICD-10-CM | POA: Diagnosis present

## 2018-10-12 DIAGNOSIS — R0609 Other forms of dyspnea: Secondary | ICD-10-CM | POA: Diagnosis present

## 2018-10-12 LAB — CBC WITH DIFFERENTIAL/PLATELET
Abs Immature Granulocytes: 0.15 10*3/uL — ABNORMAL HIGH (ref 0.00–0.07)
Basophils Absolute: 0 10*3/uL (ref 0.0–0.1)
Basophils Relative: 0 %
Eosinophils Absolute: 0 10*3/uL (ref 0.0–0.5)
Eosinophils Relative: 0 %
HCT: 49.6 % (ref 39.0–52.0)
HEMOGLOBIN: 15.4 g/dL (ref 13.0–17.0)
Immature Granulocytes: 1 %
LYMPHS ABS: 1.5 10*3/uL (ref 0.7–4.0)
Lymphocytes Relative: 10 %
MCH: 32.4 pg (ref 26.0–34.0)
MCHC: 31 g/dL (ref 30.0–36.0)
MCV: 104.2 fL — ABNORMAL HIGH (ref 80.0–100.0)
MONOS PCT: 8 %
Monocytes Absolute: 1.3 10*3/uL — ABNORMAL HIGH (ref 0.1–1.0)
Neutro Abs: 12.1 10*3/uL — ABNORMAL HIGH (ref 1.7–7.7)
Neutrophils Relative %: 81 %
Platelets: 123 10*3/uL — ABNORMAL LOW (ref 150–400)
RBC: 4.76 MIL/uL (ref 4.22–5.81)
RDW: 14.5 % (ref 11.5–15.5)
WBC: 15 10*3/uL — ABNORMAL HIGH (ref 4.0–10.5)
nRBC: 0 % (ref 0.0–0.2)

## 2018-10-12 LAB — POCT I-STAT 7, (LYTES, BLD GAS, ICA,H+H)
Acid-base deficit: 2 mmol/L (ref 0.0–2.0)
Bicarbonate: 23.6 mmol/L (ref 20.0–28.0)
Calcium, Ion: 1.22 mmol/L (ref 1.15–1.40)
HCT: 42 % (ref 39.0–52.0)
Hemoglobin: 14.3 g/dL (ref 13.0–17.0)
O2 Saturation: 100 %
Potassium: 3.9 mmol/L (ref 3.5–5.1)
SODIUM: 134 mmol/L — AB (ref 135–145)
TCO2: 25 mmol/L (ref 22–32)
pCO2 arterial: 41.1 mmHg (ref 32.0–48.0)
pH, Arterial: 7.367 (ref 7.350–7.450)
pO2, Arterial: 300 mmHg — ABNORMAL HIGH (ref 83.0–108.0)

## 2018-10-12 LAB — URINALYSIS, ROUTINE W REFLEX MICROSCOPIC
Bilirubin Urine: NEGATIVE
Glucose, UA: NEGATIVE mg/dL
Ketones, ur: 5 mg/dL — AB
Leukocytes,Ua: NEGATIVE
Nitrite: NEGATIVE
Protein, ur: NEGATIVE mg/dL
Specific Gravity, Urine: 1.019 (ref 1.005–1.030)
pH: 5 (ref 5.0–8.0)

## 2018-10-12 LAB — STREP PNEUMONIAE URINARY ANTIGEN: Strep Pneumo Urinary Antigen: NEGATIVE

## 2018-10-12 LAB — COMPREHENSIVE METABOLIC PANEL
ALT: 34 U/L (ref 0–44)
AST: 32 U/L (ref 15–41)
Albumin: 3.4 g/dL — ABNORMAL LOW (ref 3.5–5.0)
Alkaline Phosphatase: 51 U/L (ref 38–126)
Anion gap: 12 (ref 5–15)
BUN: 26 mg/dL — ABNORMAL HIGH (ref 8–23)
CHLORIDE: 100 mmol/L (ref 98–111)
CO2: 23 mmol/L (ref 22–32)
Calcium: 8.9 mg/dL (ref 8.9–10.3)
Creatinine, Ser: 1.33 mg/dL — ABNORMAL HIGH (ref 0.61–1.24)
GFR calc Af Amer: 58 mL/min — ABNORMAL LOW (ref >60)
GFR calc non Af Amer: 50 mL/min — ABNORMAL LOW (ref >60)
GLUCOSE: 105 mg/dL — AB (ref 70–99)
Potassium: 4.2 mmol/L (ref 3.5–5.1)
Sodium: 135 mmol/L (ref 135–145)
Total Bilirubin: 1.3 mg/dL — ABNORMAL HIGH (ref 0.3–1.2)
Total Protein: 5.8 g/dL — ABNORMAL LOW (ref 6.5–8.1)

## 2018-10-12 LAB — LACTIC ACID, PLASMA
LACTIC ACID, VENOUS: 1.5 mmol/L (ref 0.5–1.9)
Lactic Acid, Venous: 2.9 mmol/L (ref 0.5–1.9)

## 2018-10-12 MED ORDER — PANTOPRAZOLE SODIUM 40 MG PO TBEC: 40.0000 mg | DELAYED_RELEASE_TABLET | Freq: Every day | ORAL | Status: DC

## 2018-10-12 MED ORDER — AMIODARONE HCL 200 MG PO TABS
100.0000 mg | ORAL_TABLET | Freq: Every day | ORAL | Status: DC
  Administered 2018-10-13 – 2018-10-18 (×6): 100 mg via ORAL
  Filled 2018-10-12 (×6): qty 1

## 2018-10-12 MED ORDER — GUAIFENESIN ER 600 MG PO TB12: 1200.0000 mg | ORAL_TABLET | Freq: Two times a day (BID) | ORAL | Status: DC

## 2018-10-12 MED ORDER — METHYLPREDNISOLONE SODIUM SUCC 125 MG IJ SOLR
125.0000 mg | Freq: Three times a day (TID) | INTRAMUSCULAR | Status: DC
  Administered 2018-10-12 – 2018-10-13 (×2): 125 mg via INTRAVENOUS
  Filled 2018-10-12 (×2): qty 2

## 2018-10-12 MED ORDER — TAMSULOSIN HCL 0.4 MG PO CAPS
0.4000 mg | ORAL_CAPSULE | Freq: Every day | ORAL | Status: DC
  Administered 2018-10-13 – 2018-10-18 (×6): 0.4 mg via ORAL
  Filled 2018-10-12 (×6): qty 1

## 2018-10-12 MED ORDER — MONTELUKAST SODIUM 10 MG PO TABS
10.0000 mg | ORAL_TABLET | Freq: Every day | ORAL | Status: DC
  Administered 2018-10-13 – 2018-10-17 (×5): 10 mg via ORAL
  Filled 2018-10-12 (×5): qty 1

## 2018-10-12 MED ORDER — HEPARIN SODIUM (PORCINE) 5000 UNIT/ML IJ SOLN
5000.0000 [IU] | Freq: Three times a day (TID) | INTRAMUSCULAR | Status: DC
  Administered 2018-10-12 – 2018-10-13 (×2): 5000 [IU] via SUBCUTANEOUS
  Filled 2018-10-12 (×2): qty 1

## 2018-10-12 MED ORDER — SODIUM CHLORIDE 0.9 % IV SOLN
1.0000 g | Freq: Once | INTRAVENOUS | Status: AC
  Administered 2018-10-12: 1 g via INTRAVENOUS
  Filled 2018-10-12: qty 10

## 2018-10-12 MED ORDER — POLYETHYLENE GLYCOL 3350 17 G PO PACK
17.0000 g | PACK | Freq: Every day | ORAL | Status: DC | PRN
  Administered 2018-10-17: 17 g via ORAL
  Filled 2018-10-12: qty 1

## 2018-10-12 MED ORDER — SODIUM CHLORIDE 0.9 % IV SOLN
500.0000 mg | INTRAVENOUS | Status: DC
  Administered 2018-10-13 – 2018-10-17 (×5): 500 mg via INTRAVENOUS
  Filled 2018-10-12 (×6): qty 500

## 2018-10-12 MED ORDER — ACETAMINOPHEN 325 MG PO TABS
650.0000 mg | ORAL_TABLET | Freq: Four times a day (QID) | ORAL | Status: DC | PRN
  Administered 2018-10-14 – 2018-10-15 (×2): 650 mg via ORAL
  Filled 2018-10-12 (×2): qty 2

## 2018-10-12 MED ORDER — SODIUM CHLORIDE 0.9 % IV SOLN
1.0000 g | INTRAVENOUS | Status: DC
  Administered 2018-10-13 – 2018-10-17 (×5): 1 g via INTRAVENOUS
  Filled 2018-10-12 (×6): qty 10

## 2018-10-12 MED ORDER — SODIUM CHLORIDE 0.9 % IV BOLUS
1000.0000 mL | Freq: Once | INTRAVENOUS | Status: AC
  Administered 2018-10-12: 1000 mL via INTRAVENOUS

## 2018-10-12 MED ORDER — CALCITONIN (SALMON) 200 UNIT/ACT NA SOLN
1.0000 | Freq: Every day | NASAL | Status: DC
  Administered 2018-10-14 – 2018-10-18 (×5): 1 via NASAL
  Filled 2018-10-12 (×3): qty 3.7

## 2018-10-12 MED ORDER — METHYLPREDNISOLONE ACETATE 80 MG/ML IJ SUSP
80.0000 mg | Freq: Once | INTRAMUSCULAR | Status: DC
  Administered 2018-10-12: 80 mg via INTRAMUSCULAR

## 2018-10-12 MED ORDER — GUAIFENESIN ER 600 MG PO TB12
1200.0000 mg | ORAL_TABLET | Freq: Two times a day (BID) | ORAL | Status: DC
  Administered 2018-10-13 – 2018-10-18 (×11): 1200 mg via ORAL
  Filled 2018-10-12 (×11): qty 2

## 2018-10-12 MED ORDER — SODIUM CHLORIDE 0.9 % IV SOLN
INTRAVENOUS | Status: AC
  Administered 2018-10-12: 13:00:00 via INTRAVENOUS

## 2018-10-12 MED ORDER — MOMETASONE FURO-FORMOTEROL FUM 200-5 MCG/ACT IN AERO
2.0000 | INHALATION_SPRAY | Freq: Two times a day (BID) | RESPIRATORY_TRACT | Status: DC
  Administered 2018-10-12 – 2018-10-18 (×11): 2 via RESPIRATORY_TRACT
  Filled 2018-10-12: qty 8.8

## 2018-10-12 MED ORDER — ADULT MULTIVITAMIN W/MINERALS CH
1.0000 | ORAL_TABLET | Freq: Every day | ORAL | Status: DC
  Administered 2018-10-13 – 2018-10-18 (×6): 1 via ORAL
  Filled 2018-10-12 (×6): qty 1

## 2018-10-12 MED ORDER — ATORVASTATIN CALCIUM 10 MG PO TABS
20.0000 mg | ORAL_TABLET | Freq: Every day | ORAL | Status: DC
  Administered 2018-10-13 – 2018-10-18 (×6): 20 mg via ORAL
  Filled 2018-10-12 (×6): qty 2

## 2018-10-12 MED ORDER — IPRATROPIUM-ALBUTEROL 0.5-2.5 (3) MG/3ML IN SOLN
3.0000 mL | RESPIRATORY_TRACT | Status: DC
  Administered 2018-10-12 (×3): 3 mL via RESPIRATORY_TRACT
  Filled 2018-10-12 (×3): qty 3

## 2018-10-12 MED ORDER — CALCIUM CARBONATE-VITAMIN D 500-200 MG-UNIT PO TABS
1.0000 | ORAL_TABLET | Freq: Every day | ORAL | Status: DC
  Administered 2018-10-13 – 2018-10-18 (×6): 1 via ORAL
  Filled 2018-10-12 (×6): qty 1

## 2018-10-12 MED ORDER — SPIRONOLACTONE 12.5 MG HALF TABLET: 12.5000 mg | ORAL_TABLET | Freq: Every day | ORAL | Status: DC

## 2018-10-12 MED ORDER — SACUBITRIL-VALSARTAN 49-51 MG PO TABS: 1.0000 | ORAL_TABLET | Freq: Two times a day (BID) | ORAL | Status: DC

## 2018-10-12 MED ORDER — PANTOPRAZOLE SODIUM 40 MG IV SOLR
40.0000 mg | INTRAVENOUS | Status: DC
  Administered 2018-10-13 – 2018-10-16 (×4): 40 mg via INTRAVENOUS
  Filled 2018-10-12 (×4): qty 40

## 2018-10-12 MED ORDER — TIOTROPIUM BROMIDE MONOHYDRATE 2.5 MCG/ACT IN AERS: 2.0000 | INHALATION_SPRAY | Freq: Every day | RESPIRATORY_TRACT | Status: DC

## 2018-10-12 MED ORDER — SODIUM CHLORIDE 0.9 % IV SOLN
500.0000 mg | Freq: Once | INTRAVENOUS | Status: AC
  Administered 2018-10-12: 500 mg via INTRAVENOUS
  Filled 2018-10-12: qty 500

## 2018-10-12 NOTE — ED Notes (Signed)
Attempted to give report x1 to 2W; informed charge nurse I will call back in 15 mins.

## 2018-10-12 NOTE — ED Notes (Signed)
ED TO INPATIENT HANDOFF REPORT  ED Nurse Name and Phone #:  Lissa Morales Name/Age/Gender Bruce Travis 84 y.o. male Room/Bed: 021C/021C  Code Status   Code Status: DNR  Home/SNF/Other Home Patient oriented to: self, place and time Is this baseline? Yes   Triage Complete: Triage complete  Chief Complaint poss sepsis   Triage Note Pt arrives by [facility] from physician's office c/o [facility] that started on Tuesday and has progressively worsened. Pt has hx of COPD, CHF and is presenting with expiratory wheezing. Pt's O2 sat was 86% on 4 L on [place]; pt denies any pain, given duoneb and 80 mg decadron at MD office. Pt given 10 mg albuterol, 0.5 atrovent, and 2 g Mag from EMS. Pt a&o.   EMS vitals: 101.8 temp Hr 130 153/71 RR 36    Allergies Allergies  Allergen Reactions  . Codeine Other (See Comments)    Thought head was going to blow off/ severe headache  . Gluten Meal Other (See Comments)    Celiac disease  . Adhesive [Tape] Other (See Comments)    Band-aids - tears skin off    Level of Care/Admitting Diagnosis ED Disposition    ED Disposition Condition [facility] Area: [place] [[id number]]  Level of Care: Progressive [102]  Diagnosis: CAP (community acquired pneumonia) [119147]  Admitting Physician: Manfred Shirts  Attending Physician: Waldron Labs, DAWOOD S [[id number]]  Estimated length of stay: 3 - 4 days  Certification:: I certify this patient will need inpatient services for at least 2 midnights  PT Class (Do Not Modify): Inpatient [101]  PT Acc Code (Do Not Modify): Private [1]       B Medical/Surgery History Past Medical History:  Diagnosis Date  . Allergic rhinitis   . Anxiety   . Bronchiectasis   . CAD (coronary artery disease)   . Celiac disease   . Chronic heart failure ([place])   . Chronic kidney disease (CKD), stage III (moderate) (HCC)   . COPD (chronic obstructive pulmonary disease) ([place])   . Diverticulosis  of colon (without mention of hemorrhage)   . Esophageal reflux   . Family history of adverse reaction to anesthesia    daughter gets PONV  . Family hx of colon cancer   . History of stomach ulcers 1980s  . Hyperlipemia   . Hypertension   . 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 Myocardial infarction [facility])    "previous MI/echo in 12/2015"  . Nephrolithiasis    "got them now; never had OR/scopes" (02/03/2016)  . On home oxygen therapy    "3L-5L; 24/7" (12/29/2017)  . Other diseases of lung, not elsewhere classified   . Personal history of colonic polyps 04/26/2007   hyperplastic   . Pneumonia "several times"   Past Surgical History:  Procedure Laterality Date  . CARDIAC CATHETERIZATION  02/03/2016  . CARDIAC CATHETERIZATION  09/30/2009   non-obstructive CAD w/30% narrowing in prox LAD (Dr. Waunita Schooner)  . CARDIAC CATHETERIZATION  05/04/2001   same at 2011 cath (Dr. Waunita Schooner)  . CARDIAC CATHETERIZATION N/A 02/03/2016   Procedure: Right/Left Heart Cath and Coronary Angiography;  Surgeon: Pixie Casino, MD;  Location: [place] [facility];  Service: Cardiovascular;  Laterality: N/A;  . EPIGLOTOPLASTY W/ MLB     removal due to carcinoma  . EXCISIONAL HEMORRHOIDECTOMY  2000s  . HAND SURGERY Right 1958   d/t crush injury  .  TONSILLECTOMY AND ADENOIDECTOMY    . ULTRASOUND GUIDANCE FOR VASCULAR ACCESS  02/03/2016   Procedure: Ultrasound Guidance For Vascular Access;  Surgeon: Pixie Casino, MD;  Location: [facility];  Service: Cardiovascular;;  . VASECTOMY       A IV Location/Drains/Wounds Patient Lines/Drains/Airways Status   Active Line/Drains/Airways    Name:   Placement date:   Placement time:   Site:   Days:   Peripheral IV 10/12/18 Right Antecubital   10/12/18    1104    Antecubital   less than 1   Peripheral IV 10/12/18 Left Antecubital   10/12/18    1105    Antecubital   less than 1          Intake/Output Last 24 hours  Intake/Output  Summary (Last 24 hours) at 10/12/2018 1250 Last data filed at 10/12/2018 1227 Gross per 24 hour  Intake 1000 ml  Output -  Net 1000 ml    Labs/Imaging Results for orders placed or performed during the hospital encounter of 10/12/18 (from the past 48 hour(s))  Lactic acid, plasma     Status: Abnormal   Collection Time: 10/12/18 10:47 AM  Result Value Ref Range   Lactic Acid, Venous 2.9 (HH) 0.5 - 1.9 mmol/L    Comment: CRITICAL RESULT CALLED TO, READ BACK BY AND VERIFIED WITH: N.Todrick Siedschlag,RN 1136 10/12/2018 CLARK,S Performed at [facility], [place] [address], [place], [place] [address]   Comprehensive metabolic panel     Status: Abnormal   Collection Time: 10/12/18 10:47 AM  Result Value Ref Range   Sodium 135 135 - 145 mmol/L   Potassium 4.2 3.5 - 5.1 mmol/L   Chloride 100 98 - 111 mmol/L   CO2 23 22 - 32 mmol/L   Glucose, Bld 105 (H) 70 - 99 mg/dL   BUN 26 (H) 8 - 23 mg/dL   Creatinine, Ser 1.33 (H) 0.61 - 1.24 mg/dL   Calcium 8.9 8.9 - 10.3 mg/dL   Total Protein 5.8 (L) 6.5 - 8.1 g/dL   Albumin 3.4 (L) 3.5 - 5.0 g/dL   AST 32 15 - 41 U/L   ALT 34 0 - 44 U/L   Alkaline Phosphatase 51 38 - 126 U/L   Total Bilirubin 1.3 (H) 0.3 - 1.2 mg/dL   GFR calc non Af Amer 50 (L) >60 mL/min   GFR calc Af Amer 58 (L) >60 mL/min   Anion gap 12 5 - 15    Comment: Performed at [facility], [place] [address] [place], [place] [address]  CBC WITH DIFFERENTIAL     Status: Abnormal   Collection Time: 10/12/18 10:47 AM  Result Value Ref Range   WBC 15.0 (H) 4.0 - 10.5 K/uL   RBC 4.76 4.22 - 5.81 MIL/uL   Hemoglobin 15.4 13.0 - 17.0 g/dL   HCT 49.6 39.0 - 52.0 %   MCV 104.2 (H) 80.0 - 100.0 fL   MCH 32.4 26.0 - 34.0 pg   MCHC 31.0 30.0 - 36.0 g/dL   RDW 14.5 11.5 - 15.5 %   Platelets 123 (L) 150 - 400 K/uL   nRBC 0.0 0.0 - 0.2 %   Neutrophils Relative % 81 %   Neutro Abs 12.1 (H) 1.7 - 7.7 K/uL   Lymphocytes Relative 10 %   Lymphs Abs 1.5 0.7 - 4.0 K/uL   Monocytes Relative 8 %    Monocytes Absolute 1.3 (H) 0.1 - 1.0 K/uL   Eosinophils Relative 0 %  Eosinophils Absolute 0.0 0.0 - 0.5 K/uL   Basophils Relative 0 %   Basophils Absolute 0.0 0.0 - 0.1 K/uL   Immature Granulocytes 1 %   Abs Immature Granulocytes 0.15 (H) 0.00 - 0.07 K/uL    Comment: Performed at [facility] [address], [place], [place] [address]  I-STAT 7, (LYTES, BLD GAS, ICA, H+H)     Status: Abnormal   Collection Time: 10/12/18 11:32 AM  Result Value Ref Range   pH, Arterial 7.367 7.350 - 7.450   pCO2 arterial 41.1 32.0 - 48.0 mmHg   pO2, Arterial 300.0 (H) 83.0 - 108.0 mmHg   Bicarbonate 23.6 20.0 - 28.0 mmol/L   TCO2 25 22 - 32 mmol/L   O2 Saturation 100.0 %   Acid-base deficit 2.0 0.0 - 2.0 mmol/L   Sodium 134 (L) 135 - 145 mmol/L   Potassium 3.9 3.5 - 5.1 mmol/L   Calcium, Ion 1.22 1.15 - 1.40 mmol/L   HCT 42.0 39.0 - 52.0 %   Hemoglobin 14.3 13.0 - 17.0 g/dL   Patient temperature HIDE    Collection site RADIAL, ALLEN'S TEST ACCEPTABLE    Drawn by RT    Sample type ARTERIAL    Dg Chest Port 1 View  Result Date: 10/12/2018 CLINICAL DATA:  CHF.  Short of breath.  Fever and chills. EXAM: PORTABLE CHEST 1 VIEW COMPARISON:  07/18/2018 FINDINGS: Normal heart size. There is aortic atherosclerosis. The advanced changes of COPD/emphysema. Bilateral lower lobe opacities are identified, left greater than right. Bilateral calcified granulomas are again noted. IMPRESSION: 1. Bilateral lower lobe opacities compatible with atelectasis and or pneumonia. 2. Advanced changes of emphysema and aortic atherosclerosis. Aortic Atherosclerosis (ICD10-I70.0) and Emphysema (ICD10-J43.9). Electronically Signed   By: Kerby Moors M.D.   On: 10/12/2018 11:24    Pending Labs Unresulted Labs (From admission, onward)    Start     Ordered   10/13/18 6286  Basic metabolic panel  Tomorrow morning,   R     10/12/18 1231   10/13/18 0500  CBC  Tomorrow morning,   R     10/12/18 1231   10/12/18 1230   Legionella Pneumophila Serogp 1 Ur Ag  Once,   R     10/12/18 1231   10/12/18 1229  Culture, sputum-assessment  Once,   R     10/12/18 1231   10/12/18 1226  Gram stain  Once,   R     10/12/18 1231   10/12/18 1226  HIV antibody (Routine Screening)  Once,   R     10/12/18 1231   10/12/18 1226  Strep pneumoniae urinary antigen  Once,   R     10/12/18 1231   10/12/18 1215  Urinalysis, Routine w reflex microscopic  Once,   R     10/12/18 1215   10/12/18 1047  Lactic acid, plasma  Now then every 2 hours,   STAT     10/12/18 1047   10/12/18 1047  Blood Culture (routine x 2)  BLOOD CULTURE X 2,   STAT     10/12/18 1047          Vitals/Pain Today's Vitals   10/12/18 1145 10/12/18 1200 10/12/18 1215 10/12/18 1245  BP: (!) 127/57 132/70 134/73 119/62  Pulse: (!) 108 (!) 102 (!) 105 (!) 102  Resp: (!) 24 (!) 25 (!) 23 (!) 26  Temp:      TempSrc:      SpO2: 98% 96% 96% 96%  Weight:      Height:      PainSc:        Isolation Precautions No active isolations  Medications Medications  azithromycin (ZITHROMAX) 500 mg in sodium chloride 0.9 % 250 mL IVPB (500 mg Intravenous New Bag/Given 10/12/18 1205)  amiodarone (PACERONE) tablet 100 mg (100 mg Oral Not Given 10/12/18 1238)  atorvastatin (LIPITOR) tablet 20 mg (20 mg Oral Not Given 10/12/18 1239)  calcitonin (salmon) (MIACALCIN/FORTICAL) nasal spray 1 spray (1 spray Alternating Nares Not Given 10/12/18 1239)  polyethylene glycol (MIRALAX / GLYCOLAX) packet 17 g (has no administration in time range)  tamsulosin (FLOMAX) capsule 0.4 mg (0.4 mg Oral Not Given 10/12/18 1242)  Calcium Carbonate-Vitamin D 600-400 MG-UNIT 1 tablet (1 tablet Oral Not Given 10/12/18 1240)  CENTRUM SILVER 50+MEN TABS 1 tablet (1 tablet Oral Not Given 10/12/18 1240)  Tiotropium Bromide Monohydrate AERS 2 puff (2 puffs Inhalation Not Given 10/12/18 1242)  montelukast (SINGULAIR) tablet 10 mg (has no administration in time range)  mometasone-formoterol (DULERA) 200-5  MCG/ACT inhaler 2 puff (2 puffs Inhalation Not Given 10/12/18 1241)  acetaminophen (TYLENOL) tablet 650 mg (has no administration in time range)  heparin injection 5,000 Units (has no administration in time range)  0.9 %  sodium chloride infusion ( Intravenous New Bag/Given 10/12/18 1247)  cefTRIAXone (ROCEPHIN) 1 g in sodium chloride 0.9 % 100 mL IVPB (has no administration in time range)  azithromycin (ZITHROMAX) 500 mg in sodium chloride 0.9 % 250 mL IVPB (has no administration in time range)  guaiFENesin (MUCINEX) 12 hr tablet 1,200 mg (has no administration in time range)  methylPREDNISolone sodium succinate (SOLU-MEDROL) 125 mg/2 mL injection 125 mg (has no administration in time range)  pantoprazole (PROTONIX) injection 40 mg (has no administration in time range)  ipratropium-albuterol (DUONEB) 0.5-2.5 (3) MG/3ML nebulizer solution 3 mL (has no administration in time range)  sodium chloride 0.9 % bolus 1,000 mL (0 mLs Intravenous Stopped 10/12/18 1227)  cefTRIAXone (ROCEPHIN) 1 g in sodium chloride 0.9 % 100 mL IVPB (0 g Intravenous Stopped 10/12/18 1246)    Mobility walks Moderate fall risk   Focused Assessments Pulmonary Assessment Handoff:  Lung sounds: L Breath Sounds: Expiratory wheezes R Breath Sounds: Expiratory wheezes O2 Device: Bi-PAP        R Recommendations: See Admitting Provider Note  Report given to:   Additional Notes:  Pt currently on Bipap. Family at the bedside.

## 2018-10-12 NOTE — Progress Notes (Signed)
Subjective:    Patient ID: Bruce Travis, male    DOB: 1935-05-01, 84 y.o.   MRN: [id number]  HPI  09/25/18 Patient has been sick for 7 to 10 days.  Symptoms began as a cold with rhinorrhea and head congestion.  It has since exacerbated his COPD.  He is having to use his nebulizer 4 times a day.  He is struggling to keep his oxygen above 92 despite being on oxygen.  He is wheezing.  He reports chest congestion.  Cough is productive of white and gray sputum.  He typically does not have any sputum production.  He denies any fevers or chills.  He denies any chest pain or pleurisy.  On exam today, he has diminished breath sounds bilaterally with diffuse expiratory wheezing that is faint and difficult to hear.  He has rhonchorous breath sounds but no crackles/Rales that I can appreciate.  At that time, my plan was: Recommended prednisone 60 mg a day and doxycycline 100 mg p.o. twice daily for COPD exacerbation.  Use albuterol neb every 4-6 hours as needed for wheezing.  Continue home oxygen.  Recheck in 48 hours or sooner if worsening.  Patient received Depo-Medrol 80 mg IM x1 now.  Seek medical attention immediately if breathing worsens   10/12/18 Patient got better from his illness earlier this month.  He was doing well Monday.  However Monday evening, he started developing a cough and chest congestion and shortness of breath.  Tuesday the shortness of breath worsen considerably.  Cough became productive of clear sputum.  Last night he started complaining of feeling very short of breath.  His oxygen dropped to 91% on 4 L.  This morning he is 86% on 4 L.  He has audible chest congestion bilaterally with rhonchorous breath sounds in all 4 lung fields.  He has diminished breath sounds throughout with expiratory wheezing.  He has increased respiratory rate with increased work of breathing. Past Medical History:  Diagnosis Date  . Allergic rhinitis   . Anxiety   . Bronchiectasis   . CAD (coronary artery disease)    . Celiac disease   . Chronic heart failure ([place])   . Chronic kidney disease (CKD), stage III (moderate) (H[facility])   . COPD (chronic obstructive pulmonary disease) ([place])   . Diverticulosis of colon (without mention of hemorrhage)   . Esophageal reflux   . Family history of adverse reaction to anesthesia    daughter gets PONV  . Family hx of colon cancer   . History of stomach ulcers 1980s  . Hyperlipemia   . Hypertension   . 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 Myocardial infarction [facility])    "previous MI/echo in 12/2015"  . Nephrolithiasis    "got them now; never had OR/scopes" (02/03/2016)  . On home oxygen therapy    "3L-5L; 24/7" (12/29/2017)  . Other diseases of lung, not elsewhere classified   . Personal history of colonic polyps 04/26/2007   hyperplastic   . Pneumonia "several times"   Past Surgical History:  Procedure Laterality Date  . CARDIAC CATHETERIZATION  02/03/2016  . CARDIAC CATHETERIZATION  09/30/2009   non-obstructive CAD w/30% narrowing in prox LAD (Dr. Waunita Schooner)  . CARDIAC CATHETERIZATION  05/04/2001   same at 2011 cath (Dr. Waunita Schooner)  . CARDIAC CATHETERIZATION N/A 02/03/2016   Procedure: Right/Left Heart Cath and Coronary Angiography;  Surgeon: Pixie Casino, MD;  Location: [facility];  Service: Cardiovascular;  Laterality: N/A;  . EPIGLOTOPLASTY W/ MLB     removal due to carcinoma  . EXCISIONAL HEMORRHOIDECTOMY  2000s  . HAND SURGERY Right 1958   d/t crush injury  . TONSILLECTOMY AND ADENOIDECTOMY    . ULTRASOUND GUIDANCE FOR VASCULAR ACCESS  02/03/2016   Procedure: Ultrasound Guidance For Vascular Access;  Surgeon: Pixie Casino, MD;  Location: [facility];  Service: Cardiovascular;;  . VASECTOMY     Current Outpatient Medications on File Prior to Visit  Medication Sig Dispense Refill  . albuterol (PROAIR HFA) 108 (90 Base) MCG/ACT inhaler INHALE 2 PUFFS EVERY 4 HOURS AS NEEDED FOR SHORTNESS OF BREATH. 2  Inhaler 5  . albuterol (PROVENTIL) (2.5 MG/3ML) 0.083% nebulizer solution Take 3 mLs (2.5 mg total) by nebulization every 4 (four) hours as needed for wheezing or shortness of breath. 75 mL 5  . ALPRAZolam (XANAX) 0.25 MG tablet TAKE ONE TABLET BY MOUTH TWICE A DAY AS NEEDED FOR ANXIETY. 20 tablet 0  . amiodarone (PACERONE) 200 MG tablet TAKE 1/2 TABLET BY MOUTH ONCE DAILY. 15 tablet 3  . aspirin EC 81 MG tablet Take 81 mg by mouth daily.    Marland Kitchen atorvastatin (LIPITOR) 20 MG tablet Take 1 tablet (20 mg total) by mouth daily. 90 tablet 3  . azelastine (ASTELIN) 0.1 % nasal spray PLACE 2 SPRAYS INTO EACH NOSTRILS TWICE DAILY AS DIRECTED. 30 mL 5  . budesonide-formoterol (SYMBICORT) 160-4.5 MCG/ACT inhaler INHALE 2 PUFFS FIRST THING IN MORNING AND THEN ANOTHER 2 PUFFS ABOUT 12 HOURS LATER. 10.2 g 11  . calcitonin, salmon, (MIACALCIN) 200 UNIT/ACT nasal spray Place 1 spray into alternate nostrils daily. 3.7 mL 12  . Calcium Carbonate-Vitamin D (CALTRATE 600+D) 600-400 MG-UNIT tablet Take 1 tablet by mouth daily.     Marland Kitchen dextromethorphan-guaiFENesin (MUCINEX DM) 30-600 MG 12hr tablet Take 1 tablet by mouth 2 (two) times daily.    Marland Kitchen doxycycline (VIBRA-TABS) 100 MG tablet Take 1 tablet (100 mg total) by mouth 2 (two) times daily. 20 tablet 0  . ENTRESTO 49-51 MG TAKE (1) TABLET BY MOUTH TWICE DAILY. 60 tablet 5  . Guaifenesin 1200 MG TB12 Take by mouth.    Marland Kitchen HYDROcodone-acetaminophen (NORCO) 5-325 MG tablet Take 1 tablet by mouth every 6 (six) hours as needed for moderate pain. 120 tablet 0  . HYDROcodone-homatropine (HYCODAN) 5-1.5 MG/5ML syrup Take 5 mLs by mouth every 6 (six) hours as needed for cough. 120 mL 0  . LUTEIN PO Take 1 capsule by mouth daily.    . montelukast (SINGULAIR) 10 MG tablet TAKE 1 TABLET BY MOUTH DAILY. 90 tablet 3  . Multiple Vitamins-Minerals (CENTRUM SILVER 50+MEN) TABS Take 1 tablet by mouth daily.    . OXYGEN Inhale 3 L into the lungs continuous. 3lpm with rest and 6 lpm with  exertion    . pantoprazole (PROTONIX) 40 MG tablet TAKE ONE TABLET BY MOUTH DAILY. 90 tablet 0  . polyethylene glycol (MIRALAX / GLYCOLAX) packet Take 17 g by mouth daily as needed for mild constipation. Mix in 8 oz liquid and drink    . predniSONE (DELTASONE) 20 MG tablet Take 3 tablets (60 mg total) by mouth daily with breakfast. 60 tablet 0  . predniSONE (DELTASONE) 5 MG tablet Take 1 tablet (5 mg total) by mouth daily with breakfast. 30 tablet 5  . SPIRIVA RESPIMAT 2.5 MCG/ACT AERS INHALE 2 PUFFS INTO THE LUNGS ONCE DAILY. 4 g 11  . spironolactone (ALDACTONE) 25 MG  tablet TAKE (1/2) TABLET BY MOUTH DAILY. 15 tablet 0  . tamsulosin (FLOMAX) 0.4 MG CAPS capsule TAKE ONE CAPSULE BY MOUTH ONCE DAILY. 90 capsule 3  . tiZANidine (ZANAFLEX) 4 MG tablet Take 1 tablet (4 mg total) by mouth every 6 (six) hours as needed for muscle spasms. 30 tablet 0   No current facility-administered medications on file prior to visit.    Allergies  Allergen Reactions  . Codeine Other (See Comments)    Thought head was going to blow off/ severe headache  . Adhesive [Tape] Other (See Comments)    Band-aids - tears skin off  . Gluten Meal Other (See Comments)    Celiac disease   Social History   Socioeconomic History  . Marital status: Married    Spouse name: Not on file  . Number of children: 3  . Years of education: Not on file  . Highest education level: Not on file  Occupational History  . 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 Financial resource strain: Not on file  . Food insecurity:    Worry: Not on file    Inability: Not on file  . Transportation needs:    Medical: Not on file    Non-medical: Not on file  Tobacco Use  . Smoking status: Former Smoker    Packs/day: 2.00    Years: 35.00    Pack years: 70.00    Types: Cigarettes    Last attempt to quit: 01/21/1989    Years since quitting: 29.7  . Smokeless tobacco: Former Systems developer     Types: [place] date: 08/23/2001  Substance and Sexual Activity  . Alcohol use: No  . Drug use: No  . Sexual activity: Not on file  Lifestyle  . Physical activity:    Days per week: Not on file    Minutes per session: Not on file  . Stress: Not on file  Relationships  . Social connections:    Talks on phone: Not on file    Gets together: Not on file    Attends religious service: Not on file    Active member of club or organization: Not on file    Attends meetings of clubs or organizations: Not on file    Relationship status: Not on file  . Intimate partner violence:    Fear of current or ex partner: Not on file    Emotionally abused: Not on file    Physically abused: Not on file    Forced sexual activity: Not on file  Other Topics Concern  . Not on file  Social History Narrative  . Not on file      Review of Systems  All other systems reviewed and are negative.      Objective:   Physical Exam  HENT:  Right Ear: Tympanic membrane and ear canal normal.  Left Ear: Tympanic membrane and ear canal normal.  Nose: No mucosal edema or rhinorrhea.  Cardiovascular: Normal rate, regular rhythm and normal heart sounds.  No murmur heard. Pulmonary/Chest: Effort normal. No respiratory distress. He has decreased breath sounds in the right upper field, the right lower field, the left upper field and the left lower field. He has wheezes in the right upper field, the right lower field, the left upper field and the left lower field. He has rhonchi in the right upper field, the right lower field, the left upper field  and the left lower field. He has no rales. He exhibits no tenderness.  Abdominal: Soft. Bowel sounds are normal.  Musculoskeletal:        General: No edema.  Vitals reviewed.        Assessment & Plan:   COPD with acute exacerbation ([place])  Patient appears to be having a severe COPD exacerbation.  He was given 0.5 mg of Atrovent and 2.5 mg of albuterol immediately  upon entering the office (920)  He was also given 80 mg of Depo-Medrol IM immediatel (930)  After receiving nebulizer therapy, the patient continued to demonstrate increased work of breathing and increased respiratory rate although improved.  He continued to have diffuse expiratory wheezes throughout and diminished breath sounds.  Therefore recommended he go to the emergency room as the patient likely needs admission for IV steroids, intensive bronchodilator therapy.  Also given the diffuse rhonchorous breath sounds, I cannot exclude underlying possible pneumonia or pulmonary edema based on his exam at the present time although he does not appear to be fluid overloaded visually on exam.  Patient does not feel comfortable going by car.  He is still working very hard to breathe and therefore we called 911 to have the patient transported by ambulance.  Patient was given an additional 2.5 mg of albuterol nebulizer therapy (940)

## 2018-10-12 NOTE — ED Triage Notes (Signed)
Pt arrives by [facility] from physician's office c/o [facility] that started on Tuesday and has progressively worsened. Pt has hx of COPD, CHF and is presenting with expiratory wheezing. Pt's O2 sat was 86% on 4 L on [place]; pt denies any pain, given duoneb and 80 mg decadron at MD office. Pt given 10 mg albuterol, 0.5 atrovent, and 2 g Mag from EMS. Pt a&o.   EMS vitals: 101.8 temp Hr 130 153/71 RR 36

## 2018-10-12 NOTE — ED Provider Notes (Signed)
[facility] EMERGENCY DEPARTMENT Provider Note   CSN: [id number] Arrival date & time: 10/12/18  1032    History   Chief Complaint No chief complaint on file.   HPI NYZAIAH KAI is a 84 y.o. male.     The history is provided by the patient. No language interpreter was used.  Shortness of Breath  Severity:  Moderate Onset quality:  Gradual Timing:  Constant Progression:  Worsening Chronicity:  New Context: URI   Relieved by:  Nothing Worsened by:  Nothing Ineffective treatments:  None tried Associated symptoms: cough and fever   Risk factors: no tobacco use   Pt complains of being short of breath,  Pt complains of a fever and chills.  Pt saw his MD today and was sent here.  EMS gave pt a total of 15 mg of albuterol 1 mg of atrovent 90 mg of decadron and 2 of mag.    Past Medical History:  Diagnosis Date  . Allergic rhinitis   . Anxiety   . Bronchiectasis   . CAD (coronary artery disease)   . Celiac disease   . Chronic heart failure ([place])   . Chronic kidney disease (CKD), stage III (moderate) (HCC)   . COPD (chronic obstructive pulmonary disease) ([place])   . Diverticulosis of colon (without mention of hemorrhage)   . Esophageal reflux   . Family history of adverse reaction to anesthesia    daughter gets PONV  . Family hx of colon cancer   . History of stomach ulcers 1980s  . Hyperlipemia   . Hypertension   . 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 Myocardial infarction [facility])    "previous MI/echo in 12/2015"  . Nephrolithiasis    "got them now; never had OR/scopes" (02/03/2016)  . On home oxygen therapy    "3L-5L; 24/7" (12/29/2017)  . Other diseases of lung, not elsewhere classified   . Personal history of colonic polyps 04/26/2007   hyperplastic   . Pneumonia "several times"    Patient Active Problem List   Diagnosis Date Noted  . Chronic respiratory failure with hypoxia ([place]) 03/02/2017  . CAD (coronary artery  disease)   . Chronic kidney disease (CKD), stage III (moderate) (HCC)   . Community acquired pneumonia of left lower lobe of lung ([place]) 08/06/2016  . Heart failure ([place]) 02/03/2016  . Cardiomyopathy, ischemic 01/06/2016  . History of acute inferior wall MI 01/06/2016  . PVC's (premature ventricular contractions) 12/12/2015  . Insomnia 04/16/2015  . Nocturnal hypoxemia 03/06/2013  . Acute respiratory failure with hypoxia ([place]) 01/10/2012  . Allergic rhinitis 01/10/2012  . Hypertension 01/10/2012  . COPD exacerbation ([place]) 08/11/2011  . Dysphagia 04/13/2011  . Hoarseness 04/02/2011  . Celiac disease 12/15/2010  . SINUSITIS, CHRONIC 09/10/2009  . ARTHUS PHENOMENON 10/05/2007  . Multiple pulmonary nodules determined by computed tomography of lung assoc with bronchiectasis 08/11/2007  . COPD GOLD II  07/06/2007  . GERD 07/06/2007    Past Surgical History:  Procedure Laterality Date  . CARDIAC CATHETERIZATION  02/03/2016  . CARDIAC CATHETERIZATION  09/30/2009   non-obstructive CAD w/30% narrowing in prox LAD (Dr. Waunita Schooner)  . CARDIAC CATHETERIZATION  05/04/2001   same at 2011 cath (Dr. Waunita Schooner)  . CARDIAC CATHETERIZATION N/A 02/03/2016   Procedure: Right/Left Heart Cath and Coronary Angiography;  Surgeon: Pixie Casino, MD;  Location: [facility] CV LAB;  Service: Cardiovascular;  Laterality: N/A;  . EPIGLOTOPLASTY W/ MLB  removal due to carcinoma  . EXCISIONAL HEMORRHOIDECTOMY  2000s  . HAND SURGERY Right 1958   d/t crush injury  . TONSILLECTOMY AND ADENOIDECTOMY    . ULTRASOUND GUIDANCE FOR VASCULAR ACCESS  02/03/2016   Procedure: Ultrasound Guidance For Vascular Access;  Surgeon: Pixie Casino, MD;  Location: [facility];  Service: Cardiovascular;;  . VASECTOMY          Home Medications    Prior to Admission medications   Medication Sig Start Date End Date Taking? Authorizing Provider  albuterol (PROAIR HFA) 108 (90 Base) MCG/ACT inhaler INHALE 2 PUFFS  EVERY 4 HOURS AS NEEDED FOR SHORTNESS OF BREATH. Patient taking differently: Inhale 2 puffs into the lungs every 4 (four) hours as needed for shortness of breath.  11/10/16  Yes Susy Frizzle, MD  albuterol (PROVENTIL) (2.5 MG/3ML) 0.083% nebulizer solution Take 3 mLs (2.5 mg total) by nebulization every 4 (four) hours as needed for wheezing or shortness of breath. 01/12/18  Yes Pickard, Cammie Mcgee, MD  ALPRAZolam (XANAX) 0.25 MG tablet TAKE ONE TABLET BY MOUTH TWICE A DAY AS NEEDED FOR ANXIETY. Patient taking differently: Take 0.25 mg by mouth 2 (two) times daily as needed for anxiety.  08/25/18  Yes Susy Frizzle, MD  amiodarone (PACERONE) 200 MG tablet TAKE 1/2 TABLET BY MOUTH ONCE DAILY. Patient taking differently: Take 100 mg by mouth daily.  08/25/18  Yes Larey Dresser, MD  aspirin EC 81 MG tablet Take 81 mg by mouth daily.   Yes [provider]  atorvastatin (LIPITOR) 20 MG tablet Take 1 tablet (20 mg total) by mouth daily. 04/27/18  Yes Susy Frizzle, MD  azelastine (ASTELIN) 0.1 % nasal spray PLACE 2 SPRAYS INTO EACH NOSTRILS TWICE DAILY AS DIRECTED. Patient taking differently: Place 2 sprays into both nostrils 2 (two) times daily.  12/13/17  Yes Susy Frizzle, MD  budesonide-formoterol (SYMBICORT) 160-4.5 MCG/ACT inhaler INHALE 2 PUFFS FIRST THING IN MORNING AND THEN ANOTHER 2 PUFFS ABOUT 12 HOURS LATER. Patient taking differently: Inhale 2 puffs into the lungs every 12 (twelve) hours.  01/27/18  Yes Susy Frizzle, MD  calcitonin, salmon, (MIACALCIN) 200 UNIT/ACT nasal spray Place 1 spray into alternate nostrils daily. 08/03/18  Yes Susy Frizzle, MD  Calcium Carbonate-Vitamin D (CALTRATE 600+D) 600-400 MG-UNIT tablet Take 1 tablet by mouth daily.    Yes [provider]  dextromethorphan-guaiFENesin (MUCINEX DM) 30-600 MG 12hr tablet Take 1 tablet by mouth 2 (two) times daily.   Yes [provider]  ENTRESTO 49-51 MG TAKE (1) TABLET BY MOUTH TWICE  DAILY. Patient taking differently: Take 1 tablet by mouth.  05/31/18  Yes Bensimhon, Shaune Pascal, MD  HYDROcodone-homatropine [facility]) 5-1.5 MG/5ML syrup Take 5 mLs by mouth every 6 (six) hours as needed for cough. 09/20/18  Yes Delsa Grana, PA-C  LUTEIN PO Take 1 capsule by mouth daily.   Yes [provider]  montelukast (SINGULAIR) 10 MG tablet TAKE 1 TABLET BY MOUTH DAILY. Patient taking differently: Take 10 mg by mouth at bedtime.  05/16/18  Yes Susy Frizzle, MD  Multiple Vitamins-Minerals (CENTRUM SILVER 50+MEN) TABS Take 1 tablet by mouth daily.   Yes [provider]  OXYGEN Inhale 3 L into the lungs See admin instructions. 3lpm with rest and 6 lpm with exertion continuous   Yes [provider]  pantoprazole (PROTONIX) 40 MG tablet TAKE ONE TABLET BY MOUTH DAILY. Patient taking differently: Take 40 mg by mouth daily.  09/26/18  Yes Susy Frizzle, MD  polyethylene glycol (MIRALAX / GLYCOLAX) packet Take 17 g by mouth daily as needed for mild constipation. Mix in 8 oz liquid and drink   Yes [provider]  predniSONE (DELTASONE) 5 MG tablet Take 1 tablet (5 mg total) by mouth daily with breakfast. 04/27/18  Yes Pickard, Cammie Mcgee, MD  SPIRIVA RESPIMAT 2.5 MCG/ACT AERS INHALE 2 PUFFS INTO THE LUNGS ONCE DAILY. Patient taking differently: Inhale 2 puffs into the lungs daily.  05/01/18  Yes Tanda Rockers, MD  spironolactone (ALDACTONE) 25 MG tablet TAKE (1/2) TABLET BY MOUTH DAILY. Patient taking differently: Take 12.5 mg by mouth daily.  10/05/18  Yes Bensimhon, Shaune Pascal, MD  tamsulosin (FLOMAX) 0.4 MG CAPS capsule TAKE ONE CAPSULE BY MOUTH ONCE DAILY. Patient taking differently: Take 0.4 mg by mouth daily.  04/05/18  Yes Susy Frizzle, MD  doxycycline (VIBRA-TABS) 100 MG tablet Take 1 tablet (100 mg total) by mouth 2 (two) times daily. 09/25/18   Susy Frizzle, MD  HYDROcodone-acetaminophen (NORCO) 5-325 MG tablet Take 1 tablet by mouth every 6 (six)  hours as needed for moderate pain. Patient not taking: Reported on 10/12/2018 09/11/18   Susy Frizzle, MD  tiZANidine (ZANAFLEX) 4 MG tablet Take 1 tablet (4 mg total) by mouth every 6 (six) hours as needed for muscle spasms. Patient not taking: Reported on 10/12/2018 08/31/18   Susy Frizzle, MD    Family History Family History  Problem Relation Age of Onset  . Heart disease Father   . Colon cancer Father   . Prostate cancer Father   . Stroke Mother   . Endometrial cancer Sister   . Stroke Maternal Grandmother   . Cancer Paternal Grandmother     Social History Social History   Tobacco Use  . Smoking status: Former Smoker    Packs/day: 2.00    Years: 35.00    Pack years: 70.00    Types: Cigarettes    Last attempt to quit: 01/21/1989    Years since quitting: 29.7  . Smokeless tobacco: Former Systems developer    Types: Chew    Quit date: 08/23/2001  Substance Use Topics  . Alcohol use: No  . Drug use: No     Allergies   Codeine; Gluten meal; and Adhesive [tape]   Review of Systems Review of Systems  Constitutional: Positive for fever.  Respiratory: Positive for cough and shortness of breath.   All other systems reviewed and are negative.    Physical Exam Updated Vital Signs BP 133/67   Pulse (!) 105   Temp (!) 100.9 F (38.3 C) (Rectal)   Resp (!) 22   Ht 5' 6"  (1.676 m)   Wt 58.1 kg   SpO2 100%   BMI 20.66 kg/m   Physical Exam Vitals signs and nursing note reviewed.  Constitutional:      Appearance: He is well-developed.  HENT:     Head: Normocephalic.     Nose: Nose normal.     Mouth/Throat:     Mouth: Mucous membranes are moist.  Eyes:     Pupils: Pupils are equal, round, and reactive to light.  Neck:     Musculoskeletal: Normal range of motion.  Cardiovascular:     Rate and Rhythm: Tachycardia present.  Pulmonary:     Effort: Pulmonary effort is normal.     Breath sounds: Wheezing and rhonchi present.  Abdominal:     General: There is no  distension.  Musculoskeletal: Normal range of motion.  Skin:    General: Skin is warm.  Neurological:     Mental Status: He is alert and oriented to person, place, and time.  Psychiatric:        Mood and Affect: Mood normal.      ED Treatments / Results  Labs (all labs ordered are listed, but only abnormal results are displayed) Labs Reviewed  LACTIC ACID, PLASMA - Abnormal; Notable for the following components:      Result Value   Lactic Acid, Venous 2.9 (*)    All other components within normal limits  COMPREHENSIVE METABOLIC PANEL - Abnormal; Notable for the following components:   Glucose, Bld 105 (*)    BUN 26 (*)    Creatinine, Ser 1.33 (*)    Total Protein 5.8 (*)    Albumin 3.4 (*)    Total Bilirubin 1.3 (*)    GFR calc non Af Amer 50 (*)    GFR calc Af Amer 58 (*)    All other components within normal limits  CBC WITH DIFFERENTIAL/PLATELET - Abnormal; Notable for the following components:   WBC 15.0 (*)    MCV 104.2 (*)    Platelets 123 (*)    Neutro Abs 12.1 (*)    Monocytes Absolute 1.3 (*)    Abs Immature Granulocytes 0.15 (*)    All other components within normal limits  POCT I-STAT 7, (LYTES, BLD GAS, ICA,H+H) - Abnormal; Notable for the following components:   pO2, Arterial 300.0 (*)    Sodium 134 (*)    All other components within normal limits  CULTURE, BLOOD (ROUTINE X 2)  CULTURE, BLOOD (ROUTINE X 2)  LACTIC ACID, PLASMA  URINALYSIS, ROUTINE W REFLEX MICROSCOPIC  I-STAT ARTERIAL BLOOD GAS, ED    EKG EKG Interpretation  Date/Time:  Thursday October 12 2018 10:41:22 EST Ventricular Rate:  117 PR Interval:    QRS Duration: 160 QT Interval:  320 QTC Calculation: 447 R Axis:   -95 Text Interpretation:  Sinus tachycardia Multiform ventricular premature complexes RBBB and LAFB Confirmed by Gerlene Fee [id number]) on 10/12/2018 10:44:22 AM   Radiology Dg Chest Port 1 View  Result Date: 10/12/2018 CLINICAL DATA:  CHF.  Short of breath.  Fever  and chills. EXAM: PORTABLE CHEST 1 VIEW COMPARISON:  07/18/2018 FINDINGS: Normal heart size. There is aortic atherosclerosis. The advanced changes of COPD/emphysema. Bilateral lower lobe opacities are identified, left greater than right. Bilateral calcified granulomas are again noted. IMPRESSION: 1. Bilateral lower lobe opacities compatible with atelectasis and or pneumonia. 2. Advanced changes of emphysema and aortic atherosclerosis. Aortic Atherosclerosis (ICD10-I70.0) and Emphysema (ICD10-J43.9). Electronically Signed   By: Kerby Moors M.D.   On: 10/12/2018 11:24    Procedures .Critical Care Performed by: Fransico Meadow, PA-C Authorized by: Fransico Meadow, PA-C   Critical care provider statement:    Critical care time (minutes):  45   Critical care start time:  10/12/2018 9:30 AM   Critical care end time:  10/12/2018 12:09 PM   Critical care was necessary to treat or prevent imminent or life-threatening deterioration of the following conditions:  Respiratory failure   Critical care was time spent personally by me on the following activities:  Blood draw for specimens, examination of patient, interpretation of cardiac output measurements, obtaining history from patient or surrogate, ordering and review of laboratory studies, ordering and review of radiographic studies, pulse oximetry and review of old charts   (including critical care  time)  Medications Ordered in ED Medications  sodium chloride 0.9 % bolus 1,000 mL (1,000 mLs Intravenous New Bag/Given 10/12/18 1108)  cefTRIAXone (ROCEPHIN) 1 g in sodium chloride 0.9 % 100 mL IVPB (1 g Intravenous New Bag/Given 10/12/18 1203)  azithromycin (ZITHROMAX) 500 mg in sodium chloride 0.9 % 250 mL IVPB (has no administration in time range)     Initial Impression / Assessment and Plan / ED Course  I have reviewed the triage vital signs and the nursing notes.  Pertinent labs & imaging results that were available during my care of the patient  were reviewed by me and considered in my medical decision making (see chart for details).        MDM   Pt placed on bipap,  Pt improving after 30 minutes.  ABg obtained.  Pt looks better.  Chest xray shows bilat lower lobe pneumonia  Pt started on Rocephin and zithromax.   Pt has elevated lactic acid.  Pt noted to have an EF of 27,  Iv fluids given slowly  Hospitalist consulted for admission   Final Clinical Impressions(s) / ED Diagnoses   Final diagnoses:  Community acquired pneumonia of right lower lobe of lung [facility])    ED Discharge Orders    None       Sidney Ace 10/12/18 1212    Maudie Flakes, MD 10/12/18 [phone]

## 2018-10-12 NOTE — ED Notes (Signed)
Received critical value from [place]. Lactic acid 2.9; MD notified.

## 2018-10-12 NOTE — ED Notes (Signed)
ED Provider at bedside. 

## 2018-10-12 NOTE — Addendum Note (Signed)
Addended by: Shary Decamp B on: 10/12/2018 03:17 PM   Modules accepted: Orders

## 2018-10-12 NOTE — H&P (Signed)
TRH H&P   Patient Demographics:    Bruce Travis, is a 84 y.o. male  MRN: [id number]   DOB - 04-10-1935  Admit Date - 10/12/2018  Outpatient Primary MD for the patient is Dennard Schaumann Cammie Mcgee, MD  Referring MD/NP/PA: Threasa Alpha  Outpatient Specialists: card dr bensimhon, monitor to Dr. Melvyn Novas  Patient coming from: Home  No chief complaint on file.     HPI:    Bruce Travis  is a 84 y.o. male, with chronic respiratory failure on home 3 L nasal cannula at rest, 6 L on exertion, chronic diastolic CHF, chronic systolic CHF, CKD stage III, GERD, COPD, presents secondary to dyspnea, patient reports shortness of breath last week, seen by PCP, he was given prednisone taper, doxycycline, improvement of symptoms, his court finished on Monday, reports on Tuesday he started to have progressive dyspnea, mainly on exertion, then at rest, reports cough, productive with yellow phlegm, [place] any fever at home, he is febrile in ED, no chest pain, no nausea, no vomiting, no diarrhea or constipation, no dysuria or polyuria. -Patient did receive IV Decadron 80 mg, continue with nebs in route, - in ED patient with severe dyspnea and increased work of breathing, he was started on Pap, chest x-ray significant for bibasilar pneumonia, work-up significant with sepsis as febrile, elevated lactic acid and leukocytosis, tachypneic and tachycardic, started on IV Rocephin, azithromycin, and I was called to admit.    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 No Chest pain, dyspnea, wheezing, cough with productive phlegm No Abdominal pain, No Nausea or  Vommitting, Bowel movements are regular, No Blood in stool or Urine, No dysuria, No new skin rashes or bruises, No new joints pains-aches,  No new weakness, tingling, numbness in any extremity, No recent weight gain or loss, No polyuria, polydypsia or polyphagia, No significant Mental Stressors. .   With Past History of the following :    Past Medical History:  Diagnosis Date  . Allergic rhinitis   . Anxiety   . Bronchiectasis   . CAD (coronary artery disease)   . Celiac disease   . Chronic heart failure ([place])   . Chronic kidney disease (CKD), stage III (moderate) (HCC)   . COPD (chronic obstructive pulmonary disease) ([place])   . Diverticulosis of colon (without mention of hemorrhage)   . Esophageal reflux   . Family history of adverse reaction to anesthesia    daughter gets PONV  . Family hx of colon cancer   . History of stomach ulcers 1980s  . Hyperlipemia   . Hypertension   . 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 Myocardial infarction [facility])    "previous MI/echo in 12/2015"  . Nephrolithiasis    "got them now; never had OR/scopes" (02/03/2016)  . On home oxygen therapy    "3L-5L; 24/7" (12/29/2017)  . Other diseases of lung, not elsewhere classified   . Personal history of colonic polyps 04/26/2007   hyperplastic   . Pneumonia "several times"      Past Surgical History:  Procedure Laterality Date  . CARDIAC CATHETERIZATION  02/03/2016  . CARDIAC CATHETERIZATION  09/30/2009   non-obstructive CAD w/30% narrowing in prox LAD (Dr. Waunita Schooner)  . CARDIAC CATHETERIZATION  05/04/2001   same at 2011 cath (Dr. Waunita Schooner)  . CARDIAC CATHETERIZATION N/A 02/03/2016   Procedure: Right/Left Heart Cath and Coronary Angiography;  Surgeon: Pixie Casino, MD;  Location: [place] [facility];  Service: Cardiovascular;  Laterality: N/A;  . EPIGLOTOPLASTY W/ MLB     removal due to carcinoma  . EXCISIONAL HEMORRHOIDECTOMY  2000s  . HAND SURGERY Right 1958    d/t crush injury  . TONSILLECTOMY AND ADENOIDECTOMY    . ULTRASOUND GUIDANCE FOR VASCULAR ACCESS  02/03/2016   Procedure: Ultrasound Guidance For Vascular Access;  Surgeon: Pixie Casino, MD;  Location: [place] [facility];  Service: Cardiovascular;;  . VASECTOMY        Social History:     Social History   Tobacco Use  . Smoking status: Former Smoker    Packs/day: 2.00    Years: 35.00    Pack years: 70.00    Types: Cigarettes    Last attempt to quit: 01/21/1989    Years since quitting: 29.7  . Smokeless tobacco: Former Systems developer    Types: Chew    Quit date: 08/23/2001  Substance Use Topics  . Alcohol use: No     Lives -at home  Mobility -assistance     Family History :     Family History  Problem Relation Age of Onset  . Heart disease Father   . Colon cancer Father   . Prostate cancer Father   . Stroke Mother   . Endometrial cancer Sister   . Stroke Maternal Grandmother   . Cancer Paternal Grandmother      Home Medications:   Prior to Admission medications   Medication Sig Start Date End Date Taking? Authorizing Provider  albuterol (PROAIR HFA) 108 (90 Base) MCG/ACT inhaler INHALE 2 PUFFS EVERY 4 HOURS AS NEEDED FOR SHORTNESS OF BREATH. Patient taking differently: Inhale 2 puffs into the lungs every 4 (four) hours as needed for shortness of breath.  11/10/16  Yes Susy Frizzle, MD  albuterol (PROVENTIL) (2.5 MG/3ML) 0.083% nebulizer solution Take 3 mLs (2.5 mg total) by nebulization every 4 (four) hours as needed for wheezing or shortness of breath. 01/12/18  Yes Pickard, Cammie Mcgee, MD  ALPRAZolam (XANAX) 0.25 MG tablet TAKE ONE TABLET BY MOUTH TWICE A DAY AS NEEDED FOR ANXIETY. Patient taking differently: Take 0.25 mg by mouth 2 (two) times daily as needed for anxiety.  08/25/18  Yes Susy Frizzle, MD  amiodarone (PACERONE) 200 MG tablet TAKE 1/2 TABLET BY MOUTH ONCE DAILY. Patient taking differently: Take 100 mg by mouth daily.  08/25/18  Yes Larey Dresser, MD    aspirin EC 81 MG tablet Take 81 mg by mouth daily.   Yes [provider]  atorvastatin (LIPITOR) 20 MG tablet Take 1 tablet (20 mg total) by mouth daily. 04/27/18  Yes Pickard,  Cammie Mcgee, MD  azelastine (ASTELIN) 0.1 % nasal spray PLACE 2 SPRAYS INTO EACH NOSTRILS TWICE DAILY AS DIRECTED. Patient taking differently: Place 2 sprays into both nostrils 2 (two) times daily.  12/13/17  Yes Susy Frizzle, MD  budesonide-formoterol (SYMBICORT) 160-4.5 MCG/ACT inhaler INHALE 2 PUFFS FIRST THING IN MORNING AND THEN ANOTHER 2 PUFFS ABOUT 12 HOURS LATER. Patient taking differently: Inhale 2 puffs into the lungs every 12 (twelve) hours.  01/27/18  Yes Susy Frizzle, MD  calcitonin, salmon, (MIACALCIN) 200 UNIT/ACT nasal spray Place 1 spray into alternate nostrils daily. 08/03/18  Yes Susy Frizzle, MD  Calcium Carbonate-Vitamin D (CALTRATE 600+D) 600-400 MG-UNIT tablet Take 1 tablet by mouth daily.    Yes [provider]  dextromethorphan-guaiFENesin (MUCINEX DM) 30-600 MG 12hr tablet Take 1 tablet by mouth 2 (two) times daily.   Yes [provider]  ENTRESTO 49-51 MG TAKE (1) TABLET BY MOUTH TWICE DAILY. Patient taking differently: Take 1 tablet by mouth.  05/31/18  Yes Bensimhon, Shaune Pascal, MD  HYDROcodone-homatropine [facility]) 5-1.5 MG/5ML syrup Take 5 mLs by mouth every 6 (six) hours as needed for cough. 09/20/18  Yes Delsa Grana, PA-C  LUTEIN PO Take 1 capsule by mouth daily.   Yes [provider]  montelukast (SINGULAIR) 10 MG tablet TAKE 1 TABLET BY MOUTH DAILY. Patient taking differently: Take 10 mg by mouth at bedtime.  05/16/18  Yes Susy Frizzle, MD  Multiple Vitamins-Minerals (CENTRUM SILVER 50+MEN) TABS Take 1 tablet by mouth daily.   Yes [provider]  OXYGEN Inhale 3 L into the lungs See admin instructions. 3lpm with rest and 6 lpm with exertion continuous   Yes [provider]  pantoprazole (PROTONIX) 40 MG tablet TAKE ONE TABLET  BY MOUTH DAILY. Patient taking differently: Take 40 mg by mouth daily.  09/26/18  Yes Susy Frizzle, MD  polyethylene glycol (MIRALAX / GLYCOLAX) packet Take 17 g by mouth daily as needed for mild constipation. Mix in 8 oz liquid and drink   Yes [provider]  predniSONE (DELTASONE) 5 MG tablet Take 1 tablet (5 mg total) by mouth daily with breakfast. 04/27/18  Yes Pickard, Cammie Mcgee, MD  SPIRIVA RESPIMAT 2.5 MCG/ACT AERS INHALE 2 PUFFS INTO THE LUNGS ONCE DAILY. Patient taking differently: Inhale 2 puffs into the lungs daily.  05/01/18  Yes Tanda Rockers, MD  spironolactone (ALDACTONE) 25 MG tablet TAKE (1/2) TABLET BY MOUTH DAILY. Patient taking differently: Take 12.5 mg by mouth daily.  10/05/18  Yes Bensimhon, Shaune Pascal, MD  tamsulosin (FLOMAX) 0.4 MG CAPS capsule TAKE ONE CAPSULE BY MOUTH ONCE DAILY. Patient taking differently: Take 0.4 mg by mouth daily.  04/05/18  Yes Susy Frizzle, MD  doxycycline (VIBRA-TABS) 100 MG tablet Take 1 tablet (100 mg total) by mouth 2 (two) times daily. 09/25/18   Susy Frizzle, MD  HYDROcodone-acetaminophen (NORCO) 5-325 MG tablet Take 1 tablet by mouth every 6 (six) hours as needed for moderate pain. Patient not taking: Reported on 10/12/2018 09/11/18   Susy Frizzle, MD  tiZANidine (ZANAFLEX) 4 MG tablet Take 1 tablet (4 mg total) by mouth every 6 (six) hours as needed for muscle spasms. Patient not taking: Reported on 10/12/2018 08/31/18   Susy Frizzle, MD     Allergies:     Allergies  Allergen Reactions  . Codeine Other (See Comments)    Thought head was going to blow off/ severe headache  . Gluten Meal Other (  See Comments)    Celiac disease  . Adhesive [Tape] Other (See Comments)    Band-aids - tears skin off     Physical Exam:   Vitals  Blood pressure 119/62, pulse (!) 102, temperature (!) 100.9 F (38.3 C), temperature source Rectal, resp. rate (!) 26, height _0  (1.676 m), weight 58.1 kg, SpO2 96 %.   1. General  frail elderly male, extremely frail, cachectic sitting in bed, significant distress and increased work of breathing, on BiPAP  2. Normal affect and insight, Not Suicidal or Homicidal, Awake Alert, Oriented X 3.  3. No F.N deficits, ALL C.Nerves Intact, Strength 5/5 all 4 extremities, Sensation intact all 4 extremities, Plantars down going.  4. Ears and Eyes appear Normal, Conjunctivae clear, PERRLA.  On BiPAP mask  5. Supple Neck, No JVD, No cervical lymphadenopathy appriciated, No Carotid Bruits.  6.  Significantly diminished air entry bilaterally, increased use of accessory muscles, entry significantly diminished, was barely to able hear wheezing .  7.  Increased rate but regular rhythm, No Gallops, Rubs or Murmurs, No Parasternal Heave.  Has +1 edema  8. Positive Bowel Sounds, Abdomen Soft, No tenderness, No organomegaly appriciated,No rebound -guarding or rigidity.  9.  No Cyanosis, Normal Skin Turgor, No Skin Rash or Bruise.  10. Good muscle tone,  joints appear normal , no effusions, Normal ROM.  11. No Palpable Lymph Nodes in Neck or Axillae     Data Review:    CBC Recent Labs  Lab 10/12/18 1047 10/12/18 1132  WBC 15.0*  --   HGB 15.4 14.3  HCT 49.6 42.0  PLT 123*  --   MCV 104.2*  --   MCH 32.4  --   MCHC 31.0  --   RDW 14.5  --   LYMPHSABS 1.5  --   MONOABS 1.3*  --   EOSABS 0.0  --   BASOSABS 0.0  --    ------------------------------------------------------------------------------------------------------------------  Chemistries  Recent Labs  Lab 10/12/18 1047 10/12/18 1132  NA 135 134*  K 4.2 3.9  CL 100  --   CO2 23  --   GLUCOSE 105*  --   BUN 26*  --   CREATININE 1.33*  --   CALCIUM 8.9  --   AST 32  --   ALT 34  --   ALKPHOS 51  --   BILITOT 1.3*  --    ------------------------------------------------------------------------------------------------------------------ estimated creatinine clearance is 35.8 mL/min (A) (by C-G formula based  on SCr of 1.33 mg/dL (H)). ------------------------------------------------------------------------------------------------------------------ No results for input(s): TSH, T4TOTAL, T3FREE, THYROIDAB in the last 72 hours.  Invalid input(s): FREET3  Coagulation profile No results for input(s): INR, PROTIME in the last 168 hours. ------------------------------------------------------------------------------------------------------------------- No results for input(s): DDIMER in the last 72 hours. -------------------------------------------------------------------------------------------------------------------  Cardiac Enzymes No results for input(s): CKMB, TROPONINI, MYOGLOBIN in the last 168 hours.  Invalid input(s): CK ------------------------------------------------------------------------------------------------------------------    Component Value Date/Time   BNP 47 01/09/2018 1032     ---------------------------------------------------------------------------------------------------------------  Urinalysis    Component Value Date/Time   COLORURINE YELLOW 12/29/2017 1500   APPEARANCEUR CLEAR 12/29/2017 1500   LABSPEC 1.018 12/29/2017 1500   PHURINE 5.0 12/29/2017 1500   GLUCOSEU NEGATIVE 12/29/2017 1500   GLUCOSEU NEGATIVE 09/01/2006 1033   HGBUR NEGATIVE 12/29/2017 1500   BILIRUBINUR NEGATIVE 12/29/2017 1500   KETONESUR NEGATIVE 12/29/2017 1500   PROTEINUR NEGATIVE 12/29/2017 1500   UROBILINOGEN 0.2 01/10/2012 1730   NITRITE NEGATIVE 12/29/2017 1500   LEUKOCYTESUR NEGATIVE 12/29/2017 1500    ----------------------------------------------------------------------------------------------------------------  Imaging Results:    Dg Chest Port 1 View  Result Date: 10/12/2018 CLINICAL DATA:  CHF.  Short of breath.  Fever and chills. EXAM: PORTABLE CHEST 1 VIEW COMPARISON:  07/18/2018 FINDINGS: Normal heart size. There is aortic atherosclerosis. The advanced changes of  COPD/emphysema. Bilateral lower lobe opacities are identified, left greater than right. Bilateral calcified granulomas are again noted. IMPRESSION: 1. Bilateral lower lobe opacities compatible with atelectasis and or pneumonia. 2. Advanced changes of emphysema and aortic atherosclerosis. Aortic Atherosclerosis (ICD10-I70.0) and Emphysema (ICD10-J43.9). Electronically Signed   By: Kerby Moors M.D.   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, has LAFB   Assessment & Plan:    Active Problems:   GERD   COPD exacerbation (HCC)   Acute respiratory failure with hypoxia (HCC)   PVC's (premature ventricular contractions)   Cardiomyopathy, ischemic   CAD (coronary artery disease)   Chronic kidney disease (CKD), stage III (moderate) (HCC)   Chronic respiratory failure with hypoxia (HCC)   CAP (community acquired pneumonia)   Acute on chronic respiratory failure -She is on baseline 3 L nasal cannula at rest, 6 on exertion, is with increased work of breathing requiring BiPAP, he will be admitted to stepdown unit, continue with BiPAP support continuously for now, if show some improvement and extensor muscle movement and improved air entry, then we can change to intermittently. -This is secondary to pneumonia and COPD exacerbation, will instruct use incentive spirometry, flutter valve, will start on Mucinex.  Sepsis due to community-acquired pneumonia -Sepsis met on admission given tachypnea, tachycardia, leukocytosis, elevated lactic acid and respiratory failure -Received 1 L of IV fluid bolus, I will give another 500 cc over next 10 hours, then hold on IV fluids given known CHF with severely reduced EF. -We will order blood cultures, will obtain sputum cultures, check pneumonia and Legionella antigen -Continue with IV Rocephin and azithromycin, start Mucinex,  flutter valve and incentive spirometry -With chronic microaspiration per family given history of throat cancer status post epiglottis resection, but he denies any neck symptoms or increased symptoms of aspiration recently, I will consult SLP  COPD exacerbation -Revealed diminished air entry bilaterally, continue with BiPAP support, I will continue with significantly high dose steroids given his days on clinical exam, will continue with IV Solu-Medrol 125 mg every 8 hours, will continue with Protonix IV for GI prophylaxis, scheduled duo nebs every 4 hours, resume home meds including Spiriva and Symbicort.    GERD - on  Protonix  Chronic combined systolic/diastolic CHF -He appears to be euvolemic currently, will monitor volume status closely -With known history of EF 25% in the past due to a PVC cardiomyopathy, as cath did show normal coronaries -Blood pressure is soft, patient is septic, I will hold his Entresto and Aldactone currently, resume once stable -Certainly no beta-blockers in the setting of severe COPD  PVC related cardiomyopathy -Continue with amiodarone(low dose, managed by CHF team as an outpatient, per their note (risk  of lung toxicity on amiodarone 100 mg daily is very low as he is very ill with severe PVC cardiomyopathy, not a good candidate for PVC ablation), which seem very reasonable, I will continue him with his low-dose amiodarone during hospital stay  Chronic kidney disease (CKD), stage III (moderate) (HCC) -Creatinine currently at baseline   BPH  - continue Flomax  DVT Prophylaxis Heparin -   SCDs   AM Labs Ordered, also please review Full Orders  Family Communication: Admission, patients condition and plan of care including tests being ordered have been discussed with the patient and wife and daughter  who indicate understanding and agree with the plan and Code Status.  Code Status DNR, he is okay with BiPAP use, this was discussed with both wife and  daughter at bedside  Likely DC to pending further work-up  Condition GUARDED    Consults called: None  Admission status: Patient, and this severely ill patient respiratory distress was requiring BiPAP, with severe COPD, and bibasilar pneumonia, and need for prolonged IV antibiotics and steroid course, in which I will anticipate he will need more than 2 days of hospital stay  Time spent in minutes : 65 minutes   Phillips Climes M.D on 10/12/2018 at 12:50 PM  Between 7am to 7pm - Pager - [phone]. After 7pm go to [email or web address] - password [facility]  [facility] - Office  [phone]

## 2018-10-12 NOTE — Progress Notes (Signed)
Placed pt on BIPAP for the night. Pt NIV settings 14/6 50% FIO2 pt tolerating well. RT will continue to monitor.

## 2018-10-12 NOTE — ED Notes (Signed)
Mepilex applied to pt's left elbow (near [facility]); skin tear cleaned with normal saline.

## 2018-10-13 DIAGNOSIS — J181 Lobar pneumonia, unspecified organism: Secondary | ICD-10-CM

## 2018-10-13 DIAGNOSIS — J9601 Acute respiratory failure with hypoxia: Secondary | ICD-10-CM

## 2018-10-13 DIAGNOSIS — N183 Chronic kidney disease, stage 3 (moderate): Secondary | ICD-10-CM

## 2018-10-13 DIAGNOSIS — I255 Ischemic cardiomyopathy: Secondary | ICD-10-CM

## 2018-10-13 LAB — CBC
HCT: 41.2 % (ref 39.0–52.0)
Hemoglobin: 13.2 g/dL (ref 13.0–17.0)
MCH: 32.6 pg (ref 26.0–34.0)
MCHC: 32 g/dL (ref 30.0–36.0)
MCV: 101.7 fL — ABNORMAL HIGH (ref 80.0–100.0)
Platelets: 99 10*3/uL — ABNORMAL LOW (ref 150–400)
RBC: 4.05 MIL/uL — ABNORMAL LOW (ref 4.22–5.81)
RDW: 14.7 % (ref 11.5–15.5)
WBC: 9.7 10*3/uL (ref 4.0–10.5)
nRBC: 0 % (ref 0.0–0.2)

## 2018-10-13 LAB — BASIC METABOLIC PANEL
Anion gap: 9 (ref 5–15)
BUN: 24 mg/dL — ABNORMAL HIGH (ref 8–23)
CO2: 20 mmol/L — ABNORMAL LOW (ref 22–32)
Calcium: 8.4 mg/dL — ABNORMAL LOW (ref 8.9–10.3)
Chloride: 106 mmol/L (ref 98–111)
Creatinine, Ser: 1.03 mg/dL (ref 0.61–1.24)
GFR calc Af Amer: >60 mL/min (ref >60)
GFR calc non Af Amer: >60 mL/min (ref >60)
GLUCOSE: 127 mg/dL — AB (ref 70–99)
Potassium: 4.1 mmol/L (ref 3.5–5.1)
Sodium: 135 mmol/L (ref 135–145)

## 2018-10-13 LAB — LEGIONELLA PNEUMOPHILA SEROGP 1 UR AG: L. pneumophila Serogp 1 Ur Ag: NEGATIVE

## 2018-10-13 LAB — MRSA PCR SCREENING: MRSA BY PCR: NEGATIVE

## 2018-10-13 MED ORDER — ALPRAZOLAM 0.25 MG PO TABS
0.2500 mg | ORAL_TABLET | Freq: Once | ORAL | Status: AC
  Administered 2018-10-13: 0.25 mg via ORAL
  Filled 2018-10-13: qty 1

## 2018-10-13 MED ORDER — METHYLPREDNISOLONE SODIUM SUCC 40 MG IJ SOLR
40.0000 mg | Freq: Two times a day (BID) | INTRAMUSCULAR | Status: DC
  Administered 2018-10-13 – 2018-10-15 (×4): 40 mg via INTRAVENOUS
  Filled 2018-10-13 (×4): qty 1

## 2018-10-13 MED ORDER — IPRATROPIUM-ALBUTEROL 0.5-2.5 (3) MG/3ML IN SOLN
3.0000 mL | Freq: Three times a day (TID) | RESPIRATORY_TRACT | Status: DC
  Administered 2018-10-13 – 2018-10-14 (×4): 3 mL via RESPIRATORY_TRACT
  Filled 2018-10-13 (×4): qty 3

## 2018-10-13 NOTE — Evaluation (Addendum)
Clinical/Bedside Swallow Evaluation Patient Details  Name: EDAN JUDAY MRN: [id number] Date of Birth: 1935-08-08  Today's Date: 10/13/2018 Time: SLP Start Time (ACUTE ONLY): 49 SLP Stop Time (ACUTE ONLY): 0935 SLP Time Calculation (min) (ACUTE ONLY): 20 min  Past Medical History:  Past Medical History:  Diagnosis Date  . Allergic rhinitis   . Anxiety   . Bronchiectasis   . CAD (coronary artery disease)   . Celiac disease   . Chronic heart failure ([place])   . Chronic kidney disease (CKD), stage III (moderate) (HCC)   . COPD (chronic obstructive pulmonary disease) ([place])   . Diverticulosis of colon (without mention of hemorrhage)   . Esophageal reflux   . Family history of adverse reaction to anesthesia    daughter gets PONV  . Family hx of colon cancer   . History of stomach ulcers 1980s  . Hyperlipemia   . Hypertension   . 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 Myocardial infarction [facility])    "previous MI/echo in 12/2015"  . Nephrolithiasis    "got them now; never had OR/scopes" (02/03/2016)  . On home oxygen therapy    "3L-5L; 24/7" (12/29/2017)  . Other diseases of lung, not elsewhere classified   . Personal history of colonic polyps 04/26/2007   hyperplastic   . Pneumonia "several times"   Past Surgical History:  Past Surgical History:  Procedure Laterality Date  . CARDIAC CATHETERIZATION  02/03/2016  . CARDIAC CATHETERIZATION  09/30/2009   non-obstructive CAD w/30% narrowing in prox LAD (Dr. Waunita Schooner)  . CARDIAC CATHETERIZATION  05/04/2001   same at 2011 cath (Dr. Waunita Schooner)  . CARDIAC CATHETERIZATION N/A 02/03/2016   Procedure: Right/Left Heart Cath and Coronary Angiography;  Surgeon: Pixie Casino, MD;  Location: [place] [facility];  Service: Cardiovascular;  Laterality: N/A;  . EPIGLOTOPLASTY W/ MLB     removal due to carcinoma  . EXCISIONAL HEMORRHOIDECTOMY  2000s  . HAND SURGERY Right 1958   d/t crush injury  . TONSILLECTOMY AND  ADENOIDECTOMY    . ULTRASOUND GUIDANCE FOR VASCULAR ACCESS  02/03/2016   Procedure: Ultrasound Guidance For Vascular Access;  Surgeon: Pixie Casino, MD;  Location: [place] [facility];  Service: Cardiovascular;;  . VASECTOMY     HPI:  Pt is a 84 y.o. male, with chronic respiratory failure on home 3 L nasal cannula at rest, 6 L on exertion, chronic diastolic CHF, chronic systolic CHF, CKD stage III, GERD, COPD, presents secondary to dyspnea, patient reports shortness of breath last week. He has PMH of laryngeal cancer with removal of epiglottis and part of vocal fold. Last MBS (05/2011) revealed baseline moderate oropharyngeal dysphagia secondary to laryngeal cancer and removal of epiglottis. Prior to [facility] pt recieved 20 sessions of NMES iwth SLP in OP.   Assessment / Plan / Recommendation Clinical Impression  Pt at bedside demonstrates swallow function [facility] for age and anatomical differences. Pt able to give report hx of laryngeal cancer and removal of epiglottis and part of vocal fold, resulting in baseline hoarse vocal quality and some swallowing difficulty. Intake of ice chips and single/consecutive sips of thin liquid resulted in wet vocal quality, but this cleared up with independent multiple swallows. Puree intake also tolerated with request for water to wash down residue. These compensatory strategies are a part of habitual practice when pt eats and drinks at home. Graham cracker solid PO not attempted as pt is gluten free, but reports  to tolerate solids at home. Given no acute changes with pt's swallow function at baseline secondary to laryngeal surgery and habitual use of compensatory strategies, recommend regular, thin liquid diet. No durther f/u from SLP warranted.   SLP Visit Diagnosis: Dysphagia, unspecified (R13.10)    Aspiration Risk  Mild aspiration risk    Diet Recommendation Thin liquid;Regular   Medication Administration: Whole meds with liquid Supervision: Patient able to self  feed Postural Changes: Seated upright at 90 degrees    Other  Recommendations Oral Care Recommendations: Oral care BID   Follow up Recommendations        Frequency and Duration            Prognosis        Swallow Study   General HPI: Pt is a 84 y.o. male, with chronic respiratory failure on home 3 L nasal cannula at rest, 6 L on exertion, chronic diastolic CHF, chronic systolic CHF, CKD stage III, GERD, COPD, presents secondary to dyspnea, patient reports shortness of breath last week. He has PMH of laryngeal cancer with removal of epiglottis and part of vocal fold. Last MBS (05/2011) revealed baseline moderate oropharyngeal dysphagia secondary to laryngeal cancer and removal of epiglottis. Prior to [facility] pt recieved 20 sessions of NMES iwth SLP in OP. Type of Study: Bedside Swallow Evaluation Previous Swallow Assessment: See HPI Diet Prior to this Study: NPO Temperature Spikes Noted: Yes Respiratory Status: Nasal cannula History of Recent Intubation: No Behavior/Cognition: Alert;Cooperative;Pleasant mood Oral Cavity Assessment: Dry;Dried secretions;Within Functional Limits Oral Care Completed by SLP: Yes Oral Cavity - Dentition: Dentures, top Vision: Functional for self-feeding Self-Feeding Abilities: Able to feed self Patient Positioning: Upright in bed Baseline Vocal Quality: Hoarse    Oral/Motor/Sensory Function Overall Oral Motor/Sensory Function: Within functional limits   Ice Chips Ice chips: Within functional limits Presentation: Spoon   Thin Liquid Thin Liquid: Within functional limits Presentation: Cup;Straw    Nectar Thick Nectar Thick Liquid: Not tested   Honey Thick Honey Thick Liquid: Not tested   Puree Puree: Within functional limits Presentation: Spoon;Self Fed   Solid     Solid: Not tested      Ellis Savage, Student SLP 10/13/2018,10:12 AM

## 2018-10-13 NOTE — Progress Notes (Signed)
PROGRESS NOTE    Bruce Travis   FKC:[id number]  DOB: [birth year]  DOA: 10/12/2018 PCP: Susy Frizzle, MD   Brief Narrative:  Bruce Travis  is a 84 y.o. male, with chronic respiratory failure on home 3 L nasal cannula at rest, 6 L on exertion, chronic diastolic CHF, chronic systolic CHF, CKD stage III, GERD, COPD, presents secondary to dyspnea. The patient reports shortness of breath last week and was seen by PCP. He was given a prednisone taper, doxycycline noted an improvement of symptoms and completed his course. He reports on Tuesday he started to have progressive dyspnea, mainly on exertion, then at rest.  He also reports cough which is productive with yellow phlegm. Chest x-ray in the ED was suggestive of bilateral lower lobe pneumonia versus atelectasis  Subjective: He states that he is not short of breath anymore.  He continues to have a cough.    Assessment & Plan:   Principal Problem:   Acute respiratory failure with hypoxia/COPD exacerbation/community-acquired pneumonia -The patient was admitted and placed on BiPAP-he appears to be improving and thus we have weaned him down to a nasal cannula for now- we will continue to follow for improvement in respiratory status - He is receiving azithromycin and ceftriaxone which will be continued - DuoNebs will be continued -We will wean Solu-Medrol -Pneumonia antigen is negative  Active Problems:   GERD -Continue Protonix    PVC's (premature ventricular contractions)   Cardiomyopathy, ischemic -Continue amiodarone   Chronic kidney disease (CKD), stage II(moderate) Creatinine 1.33 on admission has improved to 1.03  Thrombocytopenia And possibly related to sepsis  Time spent in minutes: 35 minutes DVT prophylaxis: SCDs Code Status: DNR Family Communication: No family at bedside Disposition Plan: Likely home when stable Consultants:   None Procedures:   None Antimicrobials:  Anti-infectives (From admission, onward)   Start     Dose/Rate Route Frequency Ordered Stop   10/13/18 1100  cefTRIAXone (ROCEPHIN) 1 g in sodium chloride 0.9 % 100 mL IVPB     1 g 200 mL/hr over 30 Minutes Intravenous Every 24 hours 10/12/18 1231 10/20/18 1059   10/13/18 1000  azithromycin (ZITHROMAX) 500 mg in sodium chloride 0.9 % 250 mL IVPB     500 mg 250 mL/hr over 60 Minutes Intravenous Every 24 hours 10/12/18 1231 10/20/18 0959   10/12/18 1145  cefTRIAXone (ROCEPHIN) 1 g in sodium chloride 0.9 % 100 mL IVPB     1 g 200 mL/hr over 30 Minutes Intravenous  Once 10/12/18 1143 10/12/18 1246   10/12/18 1145  azithromycin (ZITHROMAX) 500 mg in sodium chloride 0.9 % 250 mL IVPB     500 mg 250 mL/hr over 60 Minutes Intravenous  Once 10/12/18 1143 10/12/18 1424       Objective: Vitals:   10/12/18 2353 10/13/18 0443 10/13/18 0810 10/13/18 0820  BP: 119/67 102/66 (!) 124/91   Pulse: 80 70 80   Resp: (!) 25 (!) 21 (!) 25   Temp:  98 F (36.7 C) 98.1 F (36.7 C)   TempSrc:  Axillary Oral   SpO2: 95% 96% 91% 93%  Weight:      Height:        Intake/Output Summary (Last 24 hours) at 10/13/2018 0956 Last data filed at 10/12/2018 1700 Gross per 24 hour  Intake 1300 ml  Output -  Net 1300 ml   Filed Weights   10/12/18 1048  Weight: 58.1 kg    Examination: General exam: Appears comfortable  HEENT: PERRLA, oral mucosa moist, no sclera icterus or thrush Respiratory system: Rhonchi bilaterally-respiratory effort normal. Cardiovascular system: S1 & S2 heard, RRR.   Gastrointestinal system: Abdomen soft, non-tender, nondistended. Normal bowel sounds. Central nervous system: Alert and oriented. No focal neurological deficits. Extremities: No cyanosis, clubbing or edema Skin: No rashes or ulcers Psychiatry:  Mood & affect appropriate.     Data Reviewed: I have personally reviewed following labs and imaging studies  CBC: Recent Labs  Lab 10/12/18 1047 10/12/18 1132 10/13/18 0322  WBC 15.0*  --  9.7  NEUTROABS 12.1*   --   --   HGB 15.4 14.3 13.2  HCT 49.6 42.0 41.2  MCV 104.2*  --  101.7*  PLT 123*  --  99*   Basic Metabolic Panel: Recent Labs  Lab 10/12/18 1047 10/12/18 1132 10/13/18 0322  NA 135 134* 135  K 4.2 3.9 4.1  CL 100  --  106  CO2 23  --  20*  GLUCOSE 105*  --  127*  BUN 26*  --  24*  CREATININE 1.33*  --  1.03  CALCIUM 8.9  --  8.4*   GFR: Estimated Creatinine Clearance: 46.2 mL/min (by C-G formula based on SCr of 1.03 mg/dL). Liver Function Tests: Recent Labs  Lab 10/12/18 1047  AST 32  ALT 34  ALKPHOS 51  BILITOT 1.3*  PROT 5.8*  ALBUMIN 3.4*   No results for input(s): LIPASE, AMYLASE in the last 168 hours. No results for input(s): AMMONIA in the last 168 hours. Coagulation Profile: No results for input(s): INR, PROTIME in the last 168 hours. Cardiac Enzymes: No results for input(s): CKTOTAL, CKMB, CKMBINDEX, TROPONINI in the last 168 hours. BNP (last 3 results) No results for input(s): PROBNP in the last 8760 hours. HbA1C: No results for input(s): HGBA1C in the last 72 hours. CBG: No results for input(s): GLUCAP in the last 168 hours. Lipid Profile: No results for input(s): CHOL, HDL, LDLCALC, TRIG, CHOLHDL, LDLDIRECT in the last 72 hours. Thyroid Function Tests: No results for input(s): TSH, T4TOTAL, FREET4, T3FREE, THYROIDAB in the last 72 hours. Anemia Panel: No results for input(s): VITAMINB12, FOLATE, FERRITIN, TIBC, IRON, RETICCTPCT in the last 72 hours. Urine analysis:    Component Value Date/Time   COLORURINE YELLOW 10/12/2018 1420   APPEARANCEUR HAZY (A) 10/12/2018 1420   LABSPEC 1.019 10/12/2018 1420   PHURINE 5.0 10/12/2018 1420   GLUCOSEU NEGATIVE 10/12/2018 1420   GLUCOSEU NEGATIVE 09/01/2006 1033   HGBUR MODERATE (A) 10/12/2018 1420   BILIRUBINUR NEGATIVE 10/12/2018 1420   KETONESUR 5 (A) 10/12/2018 1420   PROTEINUR NEGATIVE 10/12/2018 1420   UROBILINOGEN 0.2 01/10/2012 1730   NITRITE NEGATIVE 10/12/2018 1420   LEUKOCYTESUR NEGATIVE  10/12/2018 1420   Sepsis Labs: @LABRCNTIP (procalcitonin:4,lacticidven:4) ) Recent Results (from the past 240 hour(s))  Blood Culture (routine x 2)     Status: None (Preliminary result)   Collection Time: 10/12/18 10:52 AM  Result Value Ref Range Status   Specimen Description BLOOD RIGHT HAND  Final   Special Requests   Final    BOTTLES DRAWN AEROBIC AND ANAEROBIC Blood Culture results may not be optimal due to an inadequate volume of blood received in culture bottles   Culture NO GROWTH < 24 HOURS  Final   Report Status PENDING  Incomplete  Blood Culture (routine x 2)     Status: None (Preliminary result)   Collection Time: 10/12/18 10:58 AM  Result Value Ref Range Status   Specimen Description BLOOD LEFT  HAND  Final   Special Requests   Final    BOTTLES DRAWN AEROBIC AND ANAEROBIC Blood Culture results may not be optimal due to an inadequate volume of blood received in culture bottles   Culture NO GROWTH < 24 HOURS  Final   Report Status PENDING  Incomplete         Radiology Studies: Dg Chest Port 1 View  Result Date: 10/12/2018 CLINICAL DATA:  CHF.  Short of breath.  Fever and chills. EXAM: PORTABLE CHEST 1 VIEW COMPARISON:  07/18/2018 FINDINGS: Normal heart size. There is aortic atherosclerosis. The advanced changes of COPD/emphysema. Bilateral lower lobe opacities are identified, left greater than right. Bilateral calcified granulomas are again noted. IMPRESSION: 1. Bilateral lower lobe opacities compatible with atelectasis and or pneumonia. 2. Advanced changes of emphysema and aortic atherosclerosis. Aortic Atherosclerosis (ICD10-I70.0) and Emphysema (ICD10-J43.9). Electronically Signed   By: Kerby Moors M.D.   On: 10/12/2018 11:24      Scheduled Meds: . amiodarone  100 mg Oral Daily  . atorvastatin  20 mg Oral Daily  . calcitonin (salmon)  1 spray Alternating Nares Daily  . calcium-vitamin D  1 tablet Oral Daily  . guaiFENesin  1,200 mg Oral BID  . heparin  5,000  Units Subcutaneous Q8H  . ipratropium-albuterol  3 mL Nebulization TID  . methylPREDNISolone (SOLU-MEDROL) injection  125 mg Intravenous Q8H  . mometasone-formoterol  2 puff Inhalation BID  . montelukast  10 mg Oral QHS  . multivitamin with minerals  1 tablet Oral Daily  . pantoprazole (PROTONIX) IV  40 mg Intravenous Q24H  . tamsulosin  0.4 mg Oral Daily   Continuous Infusions: . azithromycin    . cefTRIAXone (ROCEPHIN)  IV       LOS: 1 day      Debbe Odea, MD [facility] Pager: [email or web address] Password [facility] 10/13/2018, 9:56 AM

## 2018-10-13 NOTE — Progress Notes (Signed)
RT placed pt on BIPAP for the night. Pt settings 14/6 50% FIO2 with a set rate of 10. Pt VS stable and Pt tolerating well. RT will continue to monitor.

## 2018-10-14 LAB — CBC
HCT: 37.6 % — ABNORMAL LOW (ref 39.0–52.0)
Hemoglobin: 12.4 g/dL — ABNORMAL LOW (ref 13.0–17.0)
MCH: 33.4 pg (ref 26.0–34.0)
MCHC: 33 g/dL (ref 30.0–36.0)
MCV: 101.3 fL — AB (ref 80.0–100.0)
Platelets: 94 10*3/uL — ABNORMAL LOW (ref 150–400)
RBC: 3.71 MIL/uL — ABNORMAL LOW (ref 4.22–5.81)
RDW: 14.7 % (ref 11.5–15.5)
WBC: 11.6 10*3/uL — ABNORMAL HIGH (ref 4.0–10.5)
nRBC: 0 % (ref 0.0–0.2)

## 2018-10-14 LAB — BASIC METABOLIC PANEL
Anion gap: 11 (ref 5–15)
BUN: 30 mg/dL — ABNORMAL HIGH (ref 8–23)
CALCIUM: 8.7 mg/dL — AB (ref 8.9–10.3)
CO2: 19 mmol/L — AB (ref 22–32)
Chloride: 107 mmol/L (ref 98–111)
Creatinine, Ser: 1.15 mg/dL (ref 0.61–1.24)
GFR calc Af Amer: >60 mL/min (ref >60)
GFR calc non Af Amer: 59 mL/min — ABNORMAL LOW (ref >60)
Glucose, Bld: 142 mg/dL — ABNORMAL HIGH (ref 70–99)
Potassium: 4.2 mmol/L (ref 3.5–5.1)
Sodium: 137 mmol/L (ref 135–145)

## 2018-10-14 MED ORDER — POLYETHYLENE GLYCOL 3350 17 G PO PACK
17.0000 g | PACK | Freq: Every day | ORAL | Status: DC
  Administered 2018-10-14 – 2018-10-18 (×5): 17 g via ORAL
  Filled 2018-10-14 (×5): qty 1

## 2018-10-14 MED ORDER — AZELASTINE HCL 0.1 % NA SOLN
1.0000 | Freq: Two times a day (BID) | NASAL | Status: DC
  Administered 2018-10-14 – 2018-10-18 (×9): 1 via NASAL
  Filled 2018-10-14: qty 30

## 2018-10-14 MED ORDER — IPRATROPIUM-ALBUTEROL 0.5-2.5 (3) MG/3ML IN SOLN
3.0000 mL | Freq: Four times a day (QID) | RESPIRATORY_TRACT | Status: DC
  Administered 2018-10-14 – 2018-10-16 (×12): 3 mL via RESPIRATORY_TRACT
  Filled 2018-10-14 (×9): qty 3

## 2018-10-14 MED ORDER — ALPRAZOLAM 0.25 MG PO TABS
0.2500 mg | ORAL_TABLET | Freq: Two times a day (BID) | ORAL | Status: DC | PRN
  Administered 2018-10-14 – 2018-10-18 (×8): 0.25 mg via ORAL
  Filled 2018-10-14 (×8): qty 1

## 2018-10-14 NOTE — Progress Notes (Signed)
PROGRESS NOTE    Bruce Travis   CHY:[id number]  DOB: 10/22/1934  DOA: 10/12/2018 PCP: Susy Frizzle, MD   Brief Narrative:  Bruce Travis  is a 84 y.o. male, with chronic respiratory failure on home 3 L nasal cannula at rest, 6 L on exertion, chronic diastolic CHF, chronic systolic CHF, CKD stage III, GERD, COPD, presents secondary to dyspnea. The patient reports shortness of breath last week and was seen by PCP. He was given a prednisone taper, doxycycline noted an improvement of symptoms and completed his course. He reports on Tuesday he started to have progressive dyspnea, mainly on exertion, then at rest.  He also reports cough which is productive with yellow phlegm. Chest x-ray in the ED was suggestive of bilateral lower lobe pneumonia versus atelectasis  Subjective: He is slightly improved when compared to yesterday.  Continues to have a cough and dyspnea.     Assessment & Plan:   Principal Problem:   Acute respiratory failure with hypoxia/COPD exacerbation/community-acquired pneumonia -The patient was admitted and placed on BiPAP- - we will continue to follow for improvement in respiratory status - He is receiving azithromycin and ceftriaxone which will be continued - DuoNebs will be continued-continue Solu-Medrol -Pneumonia antigen is negative -He is currently on 5 L of oxygen  Active Problems:   GERD -Continue Protonix    PVC's (premature ventricular contractions)   Cardiomyopathy, ischemic -Continue amiodarone   Chronic kidney disease (CKD), stage II(moderate) Creatinine 1.33 on admission has improved to 1.03  Thrombocytopenia  - acute possibly related to sepsis- follow  Time spent in minutes: 35 minutes DVT prophylaxis: SCDs Code Status: DNR Family Communication: No family at bedside Disposition Plan: Likely home when stable Consultants:   None Procedures:   None Antimicrobials:  Anti-infectives (From admission, onward)   Start     Dose/Rate Route  Frequency Ordered Stop   10/13/18 1100  cefTRIAXone (ROCEPHIN) 1 g in sodium chloride 0.9 % 100 mL IVPB     1 g 200 mL/hr over 30 Minutes Intravenous Every 24 hours 10/12/18 1231 10/20/18 1059   10/13/18 1000  azithromycin (ZITHROMAX) 500 mg in sodium chloride 0.9 % 250 mL IVPB     500 mg 250 mL/hr over 60 Minutes Intravenous Every 24 hours 10/12/18 1231 10/20/18 0959   10/12/18 1145  cefTRIAXone (ROCEPHIN) 1 g in sodium chloride 0.9 % 100 mL IVPB     1 g 200 mL/hr over 30 Minutes Intravenous  Once 10/12/18 1143 10/12/18 1246   10/12/18 1145  azithromycin (ZITHROMAX) 500 mg in sodium chloride 0.9 % 250 mL IVPB     500 mg 250 mL/hr over 60 Minutes Intravenous  Once 10/12/18 1143 10/12/18 1424       Objective: Vitals:   10/14/18 0425 10/14/18 0735 10/14/18 0740 10/14/18 0800  BP: 105/73 (!) 107/51 (!) 107/51 126/74  Pulse: 71 73 72 86  Resp: 20 18 18  (!) 22  Temp: 97.6 F (36.4 C) 97.8 F (36.6 C)    TempSrc: Axillary Axillary    SpO2: 95% 94% 92% 92%  Weight:      Height:        Intake/Output Summary (Last 24 hours) at 10/14/2018 1018 Last data filed at 10/14/2018 0901 Gross per 24 hour  Intake 950 ml  Output 425 ml  Net 525 ml   Filed Weights   10/12/18 1048  Weight: 58.1 kg    Examination: General exam: Appears comfortable  HEENT: PERRLA, oral mucosa moist, no sclera  icterus or thrush Respiratory system: rhonchi, wheezing. Respiratory effort normal. Cardiovascular system: S1 & S2 heard,  No murmurs  Gastrointestinal system: Abdomen soft, non-tender, nondistended. Normal bowel sounds   Central nervous system: Alert and oriented. No focal neurological deficits. Extremities: No cyanosis, clubbing or edema Skin: No rashes or ulcers Psychiatry:  Mood & affect appropriate.     Data Reviewed: I have personally reviewed following labs and imaging studies  CBC: Recent Labs  Lab 10/12/18 1047 10/12/18 1132 10/13/18 0322 10/14/18 0212  WBC 15.0*  --  9.7 11.6*    NEUTROABS 12.1*  --   --   --   HGB 15.4 14.3 13.2 12.4*  HCT 49.6 42.0 41.2 37.6*  MCV 104.2*  --  101.7* 101.3*  PLT 123*  --  99* 94*   Basic Metabolic Panel: Recent Labs  Lab 10/12/18 1047 10/12/18 1132 10/13/18 0322 10/14/18 0212  NA 135 134* 135 137  K 4.2 3.9 4.1 4.2  CL 100  --  106 107  CO2 23  --  20* 19*  GLUCOSE 105*  --  127* 142*  BUN 26*  --  24* 30*  CREATININE 1.33*  --  1.03 1.15  CALCIUM 8.9  --  8.4* 8.7*   GFR: Estimated Creatinine Clearance: 41.4 mL/min (by C-G formula based on SCr of 1.15 mg/dL). Liver Function Tests: Recent Labs  Lab 10/12/18 1047  AST 32  ALT 34  ALKPHOS 51  BILITOT 1.3*  PROT 5.8*  ALBUMIN 3.4*   No results for input(s): LIPASE, AMYLASE in the last 168 hours. No results for input(s): AMMONIA in the last 168 hours. Coagulation Profile: No results for input(s): INR, PROTIME in the last 168 hours. Cardiac Enzymes: No results for input(s): CKTOTAL, CKMB, CKMBINDEX, TROPONINI in the last 168 hours. BNP (last 3 results) No results for input(s): PROBNP in the last 8760 hours. HbA1C: No results for input(s): HGBA1C in the last 72 hours. CBG: No results for input(s): GLUCAP in the last 168 hours. Lipid Profile: No results for input(s): CHOL, HDL, LDLCALC, TRIG, CHOLHDL, LDLDIRECT in the last 72 hours. Thyroid Function Tests: No results for input(s): TSH, T4TOTAL, FREET4, T3FREE, THYROIDAB in the last 72 hours. Anemia Panel: No results for input(s): VITAMINB12, FOLATE, FERRITIN, TIBC, IRON, RETICCTPCT in the last 72 hours. Urine analysis:    Component Value Date/Time   COLORURINE YELLOW 10/12/2018 1420   APPEARANCEUR HAZY (A) 10/12/2018 1420   LABSPEC 1.019 10/12/2018 1420   PHURINE 5.0 10/12/2018 1420   GLUCOSEU NEGATIVE 10/12/2018 1420   GLUCOSEU NEGATIVE 09/01/2006 1033   HGBUR MODERATE (A) 10/12/2018 1420   BILIRUBINUR NEGATIVE 10/12/2018 1420   KETONESUR 5 (A) 10/12/2018 1420   PROTEINUR NEGATIVE 10/12/2018 1420    UROBILINOGEN 0.2 01/10/2012 1730   NITRITE NEGATIVE 10/12/2018 1420   LEUKOCYTESUR NEGATIVE 10/12/2018 1420   Sepsis Labs: @LABRCNTIP (procalcitonin:4,lacticidven:4) ) Recent Results (from the past 240 hour(s))  Blood Culture (routine x 2)     Status: None (Preliminary result)   Collection Time: 10/12/18 10:52 AM  Result Value Ref Range Status   Specimen Description BLOOD RIGHT HAND  Final   Special Requests   Final    BOTTLES DRAWN AEROBIC AND ANAEROBIC Blood Culture results may not be optimal due to an inadequate volume of blood received in culture bottles   Culture NO GROWTH 1 DAY  Final   Report Status PENDING  Incomplete  Blood Culture (routine x 2)     Status: None (Preliminary result)  Collection Time: 10/12/18 10:58 AM  Result Value Ref Range Status   Specimen Description BLOOD LEFT HAND  Final   Special Requests   Final    BOTTLES DRAWN AEROBIC AND ANAEROBIC Blood Culture results may not be optimal due to an inadequate volume of blood received in culture bottles   Culture NO GROWTH 1 DAY  Final   Report Status PENDING  Incomplete  MRSA PCR Screening     Status: None   Collection Time: 10/13/18 11:50 AM  Result Value Ref Range Status   MRSA by PCR NEGATIVE NEGATIVE Final    Comment:        The GeneXpert MRSA Assay (FDA approved for NASAL specimens only), is one component of a comprehensive MRSA colonization surveillance program. It is not intended to diagnose MRSA infection nor to guide or monitor treatment for MRSA infections. Performed at [facility], [place] [address] [place], [place] [address]          Radiology Studies: Dg Chest Port 1 View  Result Date: 10/12/2018 CLINICAL DATA:  CHF.  Short of breath.  Fever and chills. EXAM: PORTABLE CHEST 1 VIEW COMPARISON:  07/18/2018 FINDINGS: Normal heart size. There is aortic atherosclerosis. The advanced changes of COPD/emphysema. Bilateral lower lobe opacities are identified, left greater than right.  Bilateral calcified granulomas are again noted. IMPRESSION: 1. Bilateral lower lobe opacities compatible with atelectasis and or pneumonia. 2. Advanced changes of emphysema and aortic atherosclerosis. Aortic Atherosclerosis (ICD10-I70.0) and Emphysema (ICD10-J43.9). Electronically Signed   By: Kerby Moors M.D.   On: 10/12/2018 11:24      Scheduled Meds: . amiodarone  100 mg Oral Daily  . atorvastatin  20 mg Oral Daily  . calcitonin (salmon)  1 spray Alternating Nares Daily  . calcium-vitamin D  1 tablet Oral Daily  . guaiFENesin  1,200 mg Oral BID  . ipratropium-albuterol  3 mL Nebulization QID  . methylPREDNISolone (SOLU-MEDROL) injection  40 mg Intravenous Q12H  . mometasone-formoterol  2 puff Inhalation BID  . montelukast  10 mg Oral QHS  . multivitamin with minerals  1 tablet Oral Daily  . pantoprazole (PROTONIX) IV  40 mg Intravenous Q24H  . polyethylene glycol  17 g Oral Daily  . tamsulosin  0.4 mg Oral Daily   Continuous Infusions: . azithromycin 500 mg (10/14/18 0901)  . cefTRIAXone (ROCEPHIN)  IV 1 g (10/13/18 1235)     LOS: 2 days      Debbe Odea, MD [facility] Pager: [email or web address] Password [email or web address] 10/14/2018, 10:18 AM

## 2018-10-15 MED ORDER — METHYLPREDNISOLONE SODIUM SUCC 40 MG IJ SOLR
40.0000 mg | INTRAMUSCULAR | Status: DC
  Administered 2018-10-16 – 2018-10-18 (×3): 40 mg via INTRAVENOUS
  Filled 2018-10-15 (×3): qty 1

## 2018-10-15 NOTE — Progress Notes (Signed)
Patient refused BIPAP for the evening

## 2018-10-15 NOTE — Progress Notes (Signed)
PROGRESS NOTE    Bruce Travis   VPX:[id number]  DOB: [birth year]  DOA: 10/12/2018 PCP: Susy Frizzle, MD   Brief Narrative:  Bruce Travis  is a 84 y.o. male, with chronic respiratory failure on home 3 L nasal cannula at rest, 6 L on exertion, chronic diastolic CHF, chronic systolic CHF, CKD stage III, GERD, COPD, presents secondary to dyspnea. The patient reports shortness of breath last week and was seen by PCP. He was given a prednisone taper, doxycycline noted an improvement of symptoms and completed his course. He reports on Tuesday he started to have progressive dyspnea, mainly on exertion, then at rest.  He also reports cough which is productive with yellow phlegm. Chest x-ray in the ED was suggestive of bilateral lower lobe pneumonia versus atelectasis  Subjective: Although he continues to have a cough, it is not as severe.  He is still short of breath when he ambulates and feels quite weak.     Assessment & Plan:   Principal Problem:   Acute respiratory failure with hypoxia/COPD exacerbation/community-acquired pneumonia -The patient was admitted and placed on BiPAP- he is no longer needing BiPAP and has been weaned to about 4 L on nasal cannula - He is receiving azithromycin and ceftriaxone which will be continued - Begin to wean Solu-Medrol -Pneumonia antigen is negative -Increase ambulation today  Active Problems:   GERD -Continue Protonix    PVC's (premature ventricular contractions)   Cardiomyopathy, ischemic -Continue amiodarone   Chronic kidney disease (CKD), stage II(moderate) Creatinine 1.33 on admission has improved to 1.03  Thrombocytopenia  - acute possibly related to sepsis- follow  Time spent in minutes: 35 minutes DVT prophylaxis: SCDs Code Status: DNR Family Communication: No family at bedside Disposition Plan: -likely home tomorrow Consultants:   None Procedures:   None Antimicrobials:  Anti-infectives (From admission, onward)   Start      Dose/Rate Route Frequency Ordered Stop   10/13/18 1100  cefTRIAXone (ROCEPHIN) 1 g in sodium chloride 0.9 % 100 mL IVPB     1 g 200 mL/hr over 30 Minutes Intravenous Every 24 hours 10/12/18 1231 10/20/18 1059   10/13/18 1000  azithromycin (ZITHROMAX) 500 mg in sodium chloride 0.9 % 250 mL IVPB     500 mg 250 mL/hr over 60 Minutes Intravenous Every 24 hours 10/12/18 1231 10/20/18 0959   10/12/18 1145  cefTRIAXone (ROCEPHIN) 1 g in sodium chloride 0.9 % 100 mL IVPB     1 g 200 mL/hr over 30 Minutes Intravenous  Once 10/12/18 1143 10/12/18 1246   10/12/18 1145  azithromycin (ZITHROMAX) 500 mg in sodium chloride 0.9 % 250 mL IVPB     500 mg 250 mL/hr over 60 Minutes Intravenous  Once 10/12/18 1143 10/12/18 1424       Objective: Vitals:   10/15/18 0500 10/15/18 0600 10/15/18 0716 10/15/18 0820  BP: 139/73 104/66  (!) 141/73  Pulse: 85 61 73 90  Resp: (!) 21 (!) 21 19 16   Temp:    98.7 F (37.1 C)  TempSrc:      SpO2: 92% 96% 94% (!) 89%  Weight:      Height:        Intake/Output Summary (Last 24 hours) at 10/15/2018 0841 Last data filed at 10/15/2018 0530 Gross per 24 hour  Intake 700 ml  Output 825 ml  Net -125 ml   Filed Weights   10/12/18 1048  Weight: 58.1 kg    Examination: General exam: Appears comfortable  HEENT: PERRLA, oral mucosa moist, no sclera icterus or thrush Respiratory system: Clear to auscultation. Respiratory effort normal. Cardiovascular system: S1 & S2 heard,  No murmurs  Gastrointestinal system: Abdomen soft, non-tender, nondistended. Normal bowel sounds   Central nervous system: Alert and oriented. No focal neurological deficits. Extremities: No cyanosis, clubbing or edema Skin: No rashes or ulcers Psychiatry:  Mood & affect appropriate.     Data Reviewed: I have personally reviewed following labs and imaging studies  CBC: Recent Labs  Lab 10/12/18 1047 10/12/18 1132 10/13/18 0322 10/14/18 0212  WBC 15.0*  --  9.7 11.6*  NEUTROABS  12.1*  --   --   --   HGB 15.4 14.3 13.2 12.4*  HCT 49.6 42.0 41.2 37.6*  MCV 104.2*  --  101.7* 101.3*  PLT 123*  --  99* 94*   Basic Metabolic Panel: Recent Labs  Lab 10/12/18 1047 10/12/18 1132 10/13/18 0322 10/14/18 0212  NA 135 134* 135 137  K 4.2 3.9 4.1 4.2  CL 100  --  106 107  CO2 23  --  20* 19*  GLUCOSE 105*  --  127* 142*  BUN 26*  --  24* 30*  CREATININE 1.33*  --  1.03 1.15  CALCIUM 8.9  --  8.4* 8.7*   GFR: Estimated Creatinine Clearance: 41.4 mL/min (by C-G formula based on SCr of 1.15 mg/dL). Liver Function Tests: Recent Labs  Lab 10/12/18 1047  AST 32  ALT 34  ALKPHOS 51  BILITOT 1.3*  PROT 5.8*  ALBUMIN 3.4*   No results for input(s): LIPASE, AMYLASE in the last 168 hours. No results for input(s): AMMONIA in the last 168 hours. Coagulation Profile: No results for input(s): INR, PROTIME in the last 168 hours. Cardiac Enzymes: No results for input(s): CKTOTAL, CKMB, CKMBINDEX, TROPONINI in the last 168 hours. BNP (last 3 results) No results for input(s): PROBNP in the last 8760 hours. HbA1C: No results for input(s): HGBA1C in the last 72 hours. CBG: No results for input(s): GLUCAP in the last 168 hours. Lipid Profile: No results for input(s): CHOL, HDL, LDLCALC, TRIG, CHOLHDL, LDLDIRECT in the last 72 hours. Thyroid Function Tests: No results for input(s): TSH, T4TOTAL, FREET4, T3FREE, THYROIDAB in the last 72 hours. Anemia Panel: No results for input(s): VITAMINB12, FOLATE, FERRITIN, TIBC, IRON, RETICCTPCT in the last 72 hours. Urine analysis:    Component Value Date/Time   COLORURINE YELLOW 10/12/2018 1420   APPEARANCEUR HAZY (A) 10/12/2018 1420   LABSPEC 1.019 10/12/2018 1420   PHURINE 5.0 10/12/2018 1420   GLUCOSEU NEGATIVE 10/12/2018 1420   GLUCOSEU NEGATIVE 09/01/2006 1033   HGBUR MODERATE (A) 10/12/2018 1420   BILIRUBINUR NEGATIVE 10/12/2018 1420   KETONESUR 5 (A) 10/12/2018 1420   PROTEINUR NEGATIVE 10/12/2018 1420    UROBILINOGEN 0.2 01/10/2012 1730   NITRITE NEGATIVE 10/12/2018 1420   LEUKOCYTESUR NEGATIVE 10/12/2018 1420   Sepsis Labs: @LABRCNTIP (procalcitonin:4,lacticidven:4) ) Recent Results (from the past 240 hour(s))  Blood Culture (routine x 2)     Status: None (Preliminary result)   Collection Time: 10/12/18 10:52 AM  Result Value Ref Range Status   Specimen Description BLOOD RIGHT HAND  Final   Special Requests   Final    BOTTLES DRAWN AEROBIC AND ANAEROBIC Blood Culture results may not be optimal due to an inadequate volume of blood received in culture bottles   Culture NO GROWTH 3 DAYS  Final   Report Status PENDING  Incomplete  Blood Culture (routine x 2)  Status: None (Preliminary result)   Collection Time: 10/12/18 10:58 AM  Result Value Ref Range Status   Specimen Description BLOOD LEFT HAND  Final   Special Requests   Final    BOTTLES DRAWN AEROBIC AND ANAEROBIC Blood Culture results may not be optimal due to an inadequate volume of blood received in culture bottles   Culture NO GROWTH 3 DAYS  Final   Report Status PENDING  Incomplete  MRSA PCR Screening     Status: None   Collection Time: 10/13/18 11:50 AM  Result Value Ref Range Status   MRSA by PCR NEGATIVE NEGATIVE Final    Comment:        The GeneXpert MRSA Assay (FDA approved for NASAL specimens only), is one component of a comprehensive MRSA colonization surveillance program. It is not intended to diagnose MRSA infection nor to guide or monitor treatment for MRSA infections. Performed at [facility], [place] [address], [place], [place] [address]          Radiology Studies: No results found.    Scheduled Meds: . amiodarone  100 mg Oral Daily  . atorvastatin  20 mg Oral Daily  . azelastine  1 spray Each Nare BID  . calcitonin (salmon)  1 spray Alternating Nares Daily  . calcium-vitamin D  1 tablet Oral Daily  . guaiFENesin  1,200 mg Oral BID  . ipratropium-albuterol  3 mL Nebulization QID  .  methylPREDNISolone (SOLU-MEDROL) injection  40 mg Intravenous Q12H  . mometasone-formoterol  2 puff Inhalation BID  . montelukast  10 mg Oral QHS  . multivitamin with minerals  1 tablet Oral Daily  . pantoprazole (PROTONIX) IV  40 mg Intravenous Q24H  . polyethylene glycol  17 g Oral Daily  . tamsulosin  0.4 mg Oral Daily   Continuous Infusions: . azithromycin Stopped (10/14/18 1006)  . cefTRIAXone (ROCEPHIN)  IV Stopped (10/14/18 1227)     LOS: 3 days      Debbe Odea, MD [facility] Pager: [email or web address] Password [email or web address] 10/15/2018, 8:41 AM

## 2018-10-16 LAB — CBC
HCT: 40.3 % (ref 39.0–52.0)
Hemoglobin: 12.9 g/dL — ABNORMAL LOW (ref 13.0–17.0)
MCH: 32.6 pg (ref 26.0–34.0)
MCHC: 32 g/dL (ref 30.0–36.0)
MCV: 101.8 fL — ABNORMAL HIGH (ref 80.0–100.0)
Platelets: 94 10*3/uL — ABNORMAL LOW (ref 150–400)
RBC: 3.96 MIL/uL — ABNORMAL LOW (ref 4.22–5.81)
RDW: 14.6 % (ref 11.5–15.5)
WBC: 8.5 10*3/uL (ref 4.0–10.5)
nRBC: 0 % (ref 0.0–0.2)

## 2018-10-16 MED ORDER — ASPIRIN EC 81 MG PO TBEC
81.0000 mg | DELAYED_RELEASE_TABLET | Freq: Every day | ORAL | Status: DC
  Administered 2018-10-16 – 2018-10-18 (×3): 81 mg via ORAL
  Filled 2018-10-16 (×3): qty 1

## 2018-10-16 MED ORDER — ACETYLCYSTEINE 20 % IN SOLN
600.0000 mg | Freq: Two times a day (BID) | RESPIRATORY_TRACT | Status: AC
  Administered 2018-10-16: 600 mg via ORAL
  Administered 2018-10-16: 200 mg via ORAL
  Administered 2018-10-17: 600 mg via ORAL
  Filled 2018-10-16 (×3): qty 4

## 2018-10-16 MED ORDER — PANTOPRAZOLE SODIUM 40 MG PO TBEC
40.0000 mg | DELAYED_RELEASE_TABLET | Freq: Every day | ORAL | Status: DC
  Administered 2018-10-17 – 2018-10-18 (×2): 40 mg via ORAL
  Filled 2018-10-16 (×2): qty 1

## 2018-10-16 NOTE — Evaluation (Signed)
Physical Therapy Evaluation Patient Details Name: Bruce Travis MRN: [id number] DOB: 1934/10/21 Today's Date: 10/16/2018   History of Present Illness  Pt is an 84 y/o male admitted secondary to increased DOE. Found to have acute respiratory failure secondary to COPD exacerbation and likely CAP. PMH ncludes CAD, CKD, CHF, and COPD on home O2.   Clinical Impression  Pt admitted secondary to problem above with deficits below. Pt limited this session secondary to increased fatigue. Required min A for steadying throughout transfer to chair as pt with increased weakness and unsteadiness. Pt reports he feels too weak to return home at d/c, so recommending SNF level therapies at d/c to increase independence and safety with functional mobility. Will continue to follow acutely to maximize functional mobility independence and safety.     Follow Up Recommendations SNF;Supervision for mobility/OOB    Equipment Recommendations  Rolling walker with 5" wheels    Recommendations for Other Services       Precautions / Restrictions Precautions Precautions: Fall Restrictions Weight Bearing Restrictions: No      Mobility  Bed Mobility               General bed mobility comments: On BSC upon entry.   Transfers Overall transfer level: Needs assistance Equipment used: 1 person hand held assist Transfers: Sit to/from [facility] Sit to Stand: Min assist Stand pivot transfers: Min assist       General transfer comment: Pt with increased unsteadiness and required min A to stand and transfer to recliner. Pt reporting increased weakness and fatigue, so further mobility deferred.   Ambulation/Gait                Stairs            Wheelchair Mobility    Modified Rankin (Stroke Patients Only)       Balance Overall balance assessment: Needs assistance Sitting-balance support: No upper extremity supported;Feet supported Sitting balance-Leahy Scale: Fair      Standing balance support: During functional activity;Single extremity supported Standing balance-Leahy Scale: Poor Standing balance comment: Reliant on external support and single extremity support.                              Pertinent Vitals/Pain Pain Assessment: No/denies pain    Home Living Family/patient expects to be discharged to:: Private residence Living Arrangements: Spouse/significant other Available Help at Discharge: Family Type of Home: House Home Access: Stairs to enter Entrance Stairs-Rails: Chemical engineer of Steps: 3 Home Layout: One level Home Equipment: Grab bars - tub/shower;Shower seat      Prior Function Level of Independence: Independent               Hand Dominance        Extremity/Trunk Assessment   Upper Extremity Assessment Upper Extremity Assessment: Generalized weakness    Lower Extremity Assessment Lower Extremity Assessment: Generalized weakness    Cervical / Trunk Assessment Cervical / Trunk Assessment: Other exceptions Cervical / Trunk Exceptions: Pt reports history of compression fx. Unsure of what level   Communication   Communication: No difficulties  Cognition Arousal/Alertness: Awake/alert Behavior During Therapy: WFL for tasks assessed/performed Overall Cognitive Status: Within Functional Limits for tasks assessed  General Comments      Exercises     Assessment/Plan    PT Assessment Patient needs continued PT services  PT Problem List Decreased strength;Decreased balance;Decreased activity tolerance;Decreased mobility;Decreased knowledge of use of DME;Decreased knowledge of precautions       PT Treatment Interventions DME instruction;Gait training;Therapeutic activities;Therapeutic exercise;Functional mobility training;Balance training;Patient/family education;Stair training    PT Goals (Current goals can be found in the  Care Plan section)  Acute Rehab PT Goals Patient Stated Goal: to get stronger and then go home  PT Goal Formulation: With patient Time For Goal Achievement: 10/30/18 Potential to Achieve Goals: Good    Frequency Min 2X/week   Barriers to discharge        Co-evaluation               AM-PAC PT "6 Clicks" Mobility  Outcome Measure Help needed turning from your back to your side while in a flat bed without using bedrails?: A Little Help needed moving from lying on your back to sitting on the side of a flat bed without using bedrails?: A Lot Help needed moving to and from a bed to a chair (including a wheelchair)?: A Little Help needed standing up from a chair using your arms (e.g., wheelchair or bedside chair)?: A Little Help needed to walk in hospital room?: A Lot Help needed climbing 3-5 steps with a railing? : A Lot 6 Click Score: 15    End of Session Equipment Utilized During Treatment: Oxygen Activity Tolerance: Patient limited by fatigue Patient left: in chair;with call bell/phone within reach;with chair alarm set Nurse Communication: Mobility status PT Visit Diagnosis: Unsteadiness on feet (R26.81);Muscle weakness (generalized) (M62.81);Difficulty in walking, not elsewhere classified (R26.2)    Time: 0964-3838 PT Time Calculation (min) (ACUTE ONLY): 20 min   Charges:   PT Evaluation $PT Eval Moderate Complexity: [place], PT, DPT  Acute Rehabilitation Services  Pager: [phone] Office: [phone]   Bruce Travis 10/16/2018, 10:28 AM

## 2018-10-16 NOTE — Progress Notes (Addendum)
PROGRESS NOTE    Bruce Travis   TRR:[id number]  DOB: 14-Oct-1934  DOA: 10/12/2018 PCP: Susy Frizzle, MD   Brief Narrative:  Bruce Travis  is a 84 y.o. male, with chronic respiratory failure on home 3 L nasal cannula at rest, 6 L on exertion, chronic diastolic CHF, chronic systolic CHF, CKD stage III, GERD, COPD, presents secondary to dyspnea. The patient reports shortness of breath last week and was seen by PCP. He was given a prednisone taper, doxycycline noted an improvement of symptoms and completed his course. He reports on Tuesday he started to have progressive dyspnea, mainly on exertion, then at rest.  He also reports cough which is productive with yellow phlegm. Chest x-ray in the ED was suggestive of bilateral lower lobe pneumonia versus atelectasis  Subjective: Continues to have a cough with congestion. Very weak and asking if he can go to a rehab facility.    Assessment & Plan:   Principal Problem:   Acute respiratory failure with hypoxia/COPD exacerbation/community-acquired pneumonia -The patient was admitted and placed on BiPAP- he is no longer needing BiPAP and has been weaned to about 4 L on nasal cannula - He is receiving azithromycin and ceftriaxone which will be continued -Pneumonia antigen is negative -Increase ambulation - PT eval ordered- suspect he will need SNF as he is very deconditioned - he remains very congested - cont mucinex and flutter valve- add Mucomyst nebs  Active Problems:   GERD -Continue Protonix    PVC's (premature ventricular contractions)   Cardiomyopathy, ischemic -Continue amiodarone and aspirin   Chronic kidney disease (CKD), stage II(moderate) Creatinine 1.33 on admission has improved to 1.03  Thrombocytopenia  - acute possibly related to sepsis- follow  Time spent in minutes: 35 minutes DVT prophylaxis: SCDs Code Status: DNR Family Communication: No family at bedside Disposition Plan: -likely home tomorrow Consultants:    None Procedures:   None Antimicrobials:  Anti-infectives (From admission, onward)   Start     Dose/Rate Route Frequency Ordered Stop   10/13/18 1100  cefTRIAXone (ROCEPHIN) 1 g in sodium chloride 0.9 % 100 mL IVPB     1 g 200 mL/hr over 30 Minutes Intravenous Every 24 hours 10/12/18 1231 10/20/18 1059   10/13/18 1000  azithromycin (ZITHROMAX) 500 mg in sodium chloride 0.9 % 250 mL IVPB     500 mg 250 mL/hr over 60 Minutes Intravenous Every 24 hours 10/12/18 1231 10/20/18 0959   10/12/18 1145  cefTRIAXone (ROCEPHIN) 1 g in sodium chloride 0.9 % 100 mL IVPB     1 g 200 mL/hr over 30 Minutes Intravenous  Once 10/12/18 1143 10/12/18 1246   10/12/18 1145  azithromycin (ZITHROMAX) 500 mg in sodium chloride 0.9 % 250 mL IVPB     500 mg 250 mL/hr over 60 Minutes Intravenous  Once 10/12/18 1143 10/12/18 1424       Objective: Vitals:   10/15/18 1633 10/15/18 1913 10/15/18 2243 10/16/18 0803  BP: 113/60  110/70 (!) 145/74  Pulse: 62 72 78 80  Resp: 19 16 18 20   Temp: 97.7 F (36.5 C)  97.9 F (36.6 C) 97.8 F (36.6 C)  TempSrc: Oral  Oral   SpO2: 96% 95% 93% 94%  Weight:      Height:        Intake/Output Summary (Last 24 hours) at 10/16/2018 0810 Last data filed at 10/16/2018 0600 Gross per 24 hour  Intake 720 ml  Output 925 ml  Net -205 ml  Filed Weights   10/12/18 1048  Weight: 58.1 kg    Examination: General exam: Appears comfortable  HEENT: PERRLA, oral mucosa moist, no sclera icterus or thrush Respiratory system: + rhonchi. Respiratory effort normal. Cardiovascular system: S1 & S2 heard,  No murmurs  Gastrointestinal system: Abdomen soft, non-tender, nondistended. Normal bowel sounds   Central nervous system: Alert and oriented. No focal neurological deficits. Extremities: No cyanosis, clubbing or edema Skin: No rashes or ulcers Psychiatry:  Mood & affect appropriate.     Data Reviewed: I have personally reviewed following labs and imaging  studies  CBC: Recent Labs  Lab 10/12/18 1047 10/12/18 1132 10/13/18 0322 10/14/18 0212  WBC 15.0*  --  9.7 11.6*  NEUTROABS 12.1*  --   --   --   HGB 15.4 14.3 13.2 12.4*  HCT 49.6 42.0 41.2 37.6*  MCV 104.2*  --  101.7* 101.3*  PLT 123*  --  99* 94*   Basic Metabolic Panel: Recent Labs  Lab 10/12/18 1047 10/12/18 1132 10/13/18 0322 10/14/18 0212  NA 135 134* 135 137  K 4.2 3.9 4.1 4.2  CL 100  --  106 107  CO2 23  --  20* 19*  GLUCOSE 105*  --  127* 142*  BUN 26*  --  24* 30*  CREATININE 1.33*  --  1.03 1.15  CALCIUM 8.9  --  8.4* 8.7*   GFR: Estimated Creatinine Clearance: 41.4 mL/min (by C-G formula based on SCr of 1.15 mg/dL). Liver Function Tests: Recent Labs  Lab 10/12/18 1047  AST 32  ALT 34  ALKPHOS 51  BILITOT 1.3*  PROT 5.8*  ALBUMIN 3.4*   No results for input(s): LIPASE, AMYLASE in the last 168 hours. No results for input(s): AMMONIA in the last 168 hours. Coagulation Profile: No results for input(s): INR, PROTIME in the last 168 hours. Cardiac Enzymes: No results for input(s): CKTOTAL, CKMB, CKMBINDEX, TROPONINI in the last 168 hours. BNP (last 3 results) No results for input(s): PROBNP in the last 8760 hours. HbA1C: No results for input(s): HGBA1C in the last 72 hours. CBG: No results for input(s): GLUCAP in the last 168 hours. Lipid Profile: No results for input(s): CHOL, HDL, LDLCALC, TRIG, CHOLHDL, LDLDIRECT in the last 72 hours. Thyroid Function Tests: No results for input(s): TSH, T4TOTAL, FREET4, T3FREE, THYROIDAB in the last 72 hours. Anemia Panel: No results for input(s): VITAMINB12, FOLATE, FERRITIN, TIBC, IRON, RETICCTPCT in the last 72 hours. Urine analysis:    Component Value Date/Time   COLORURINE YELLOW 10/12/2018 1420   APPEARANCEUR HAZY (A) 10/12/2018 1420   LABSPEC 1.019 10/12/2018 1420   PHURINE 5.0 10/12/2018 1420   GLUCOSEU NEGATIVE 10/12/2018 1420   GLUCOSEU NEGATIVE 09/01/2006 1033   HGBUR MODERATE (A)  10/12/2018 1420   BILIRUBINUR NEGATIVE 10/12/2018 1420   KETONESUR 5 (A) 10/12/2018 1420   PROTEINUR NEGATIVE 10/12/2018 1420   UROBILINOGEN 0.2 01/10/2012 1730   NITRITE NEGATIVE 10/12/2018 1420   LEUKOCYTESUR NEGATIVE 10/12/2018 1420   Sepsis Labs: @LABRCNTIP (procalcitonin:4,lacticidven:4) ) Recent Results (from the past 240 hour(s))  Blood Culture (routine x 2)     Status: None (Preliminary result)   Collection Time: 10/12/18 10:52 AM  Result Value Ref Range Status   Specimen Description BLOOD RIGHT HAND  Final   Special Requests   Final    BOTTLES DRAWN AEROBIC AND ANAEROBIC Blood Culture results may not be optimal due to an inadequate volume of blood received in culture bottles   Culture NO GROWTH 3 DAYS  Final   Report Status PENDING  Incomplete  Blood Culture (routine x 2)     Status: None (Preliminary result)   Collection Time: 10/12/18 10:58 AM  Result Value Ref Range Status   Specimen Description BLOOD LEFT HAND  Final   Special Requests   Final    BOTTLES DRAWN AEROBIC AND ANAEROBIC Blood Culture results may not be optimal due to an inadequate volume of blood received in culture bottles   Culture NO GROWTH 3 DAYS  Final   Report Status PENDING  Incomplete  MRSA PCR Screening     Status: None   Collection Time: 10/13/18 11:50 AM  Result Value Ref Range Status   MRSA by PCR NEGATIVE NEGATIVE Final    Comment:        The GeneXpert MRSA Assay (FDA approved for NASAL specimens only), is one component of a comprehensive MRSA colonization surveillance program. It is not intended to diagnose MRSA infection nor to guide or monitor treatment for MRSA infections. Performed at [facility], [place] [address], [place], [place] [address]          Radiology Studies: No results found.    Scheduled Meds: . amiodarone  100 mg Oral Daily  . atorvastatin  20 mg Oral Daily  . azelastine  1 spray Each Nare BID  . calcitonin (salmon)  1 spray Alternating Nares Daily   . calcium-vitamin D  1 tablet Oral Daily  . guaiFENesin  1,200 mg Oral BID  . ipratropium-albuterol  3 mL Nebulization QID  . methylPREDNISolone (SOLU-MEDROL) injection  40 mg Intravenous Q24H  . mometasone-formoterol  2 puff Inhalation BID  . montelukast  10 mg Oral QHS  . multivitamin with minerals  1 tablet Oral Daily  . pantoprazole (PROTONIX) IV  40 mg Intravenous Q24H  . polyethylene glycol  17 g Oral Daily  . tamsulosin  0.4 mg Oral Daily   Continuous Infusions: . azithromycin 500 mg (10/15/18 0915)  . cefTRIAXone (ROCEPHIN)  IV Stopped (10/15/18 2142)     LOS: 4 days      Debbe Odea, MD [facility] Pager: [email or web address] Password [email or web address] 10/16/2018, 8:10 AM

## 2018-10-16 NOTE — Care Management Important Message (Signed)
Important Message  Patient Details  Name: Bruce Travis MRN: [id number] Date of Birth: 07/04/1935   Medicare Important Message Given:  Yes    Bruce Travis 10/16/2018, 4:06 PM

## 2018-10-16 NOTE — [place] FL2 (Addendum)
[place] MEDICAID FL2 LEVEL OF CARE SCREENING TOOL     IDENTIFICATION  Patient Name: Bruce Travis Birthdate: 1935/07/22 Sex: male Admission Date (Current Location): 10/12/2018  [facility] and [place] Number:  Herbalist and Address:  [facility] [place], [place] [address], [place], [place] [address]      Provider Number: [id number]  Attending Physician Name and Address:  Debbe Odea, MD  Relative Name and Phone Number:       Current Level of Care: Hospital Recommended Level of Care: [place] Prior Approval Number:    Date Approved/Denied:   PASRR Number: [id number]  Discharge Plan: SNF    Current Diagnoses: Patient Active Problem List   Diagnosis Date Noted  . CAP (community acquired pneumonia) 10/12/2018  . Chronic respiratory failure with hypoxia ([place]) 03/02/2017  . CAD (coronary artery disease)   . Chronic kidney disease (CKD), stage III (moderate) (HCC)   . Community acquired pneumonia of left lower lobe of lung ([place]) 08/06/2016  . Heart failure ([place]) 02/03/2016  . Cardiomyopathy, ischemic 01/06/2016  . History of acute inferior wall MI 01/06/2016  . PVC's (premature ventricular contractions) 12/12/2015  . Insomnia 04/16/2015  . Nocturnal hypoxemia 03/06/2013  . Acute respiratory failure with hypoxia ([place]) 01/10/2012  . Allergic rhinitis 01/10/2012  . Hypertension 01/10/2012  . COPD exacerbation ([place]) 08/11/2011  . Dysphagia 04/13/2011  . Hoarseness 04/02/2011  . Celiac disease 12/15/2010  . SINUSITIS, CHRONIC 09/10/2009  . ARTHUS PHENOMENON 10/05/2007  . Multiple pulmonary nodules determined by computed tomography of lung assoc with bronchiectasis 08/11/2007  . COPD GOLD II  07/06/2007  . GERD 07/06/2007    Orientation RESPIRATION BLADDER Height & Weight     Self, Time, Situation, Place  O2(Nasal Cannula 5L) Continent Weight: 128 lb (58.1 kg) Height:  5' 6"  (167.6 cm)  BEHAVIORAL SYMPTOMS/MOOD NEUROLOGICAL BOWEL  NUTRITION STATUS      Continent Diet(Gluten free diet, thin liquids)  AMBULATORY STATUS COMMUNICATION OF NEEDS Skin   Limited Assist Verbally Normal                       Personal Care Assistance Level of Assistance  Bathing, Feeding, Dressing Bathing Assistance: Limited assistance Feeding assistance: Independent Dressing Assistance: Limited assistance     Functional Limitations Info  Sight, Hearing, Speech Sight Info: Adequate Hearing Info: Adequate Speech Info: Adequate    SPECIAL CARE FACTORS FREQUENCY  PT (By licensed PT), OT (By licensed OT)     PT Frequency: 2x OT Frequency: 2x            Contractures Contractures Info: Not present    Additional Factors Info  Code Status, Allergies Code Status Info: DNR Allergies Info: Codeine, Gluten Meal, Adhesive Tape           Current Medications (10/16/2018):  This is the current hospital active medication list Current Facility-Administered Medications  Medication Dose Route Frequency Provider Last Rate Last Dose  . acetaminophen (TYLENOL) tablet 650 mg  650 mg Oral Q6H PRN Fransico Meadow, PA-C   650 mg at 10/15/18 1105  . acetylcysteine (MUCOMYST) 20 % nebulizer / oral solution 600 mg  600 mg Oral BID Debbe Odea, MD   200 mg at 10/16/18 1128  . ALPRAZolam Duanne Moron) tablet 0.25 mg  0.25 mg Oral BID PRN Debbe Odea, MD   0.25 mg at 10/16/18 0928  . amiodarone (PACERONE) tablet 100 mg  100 mg Oral Daily Elgergawy, Silver Huguenin, MD  100 mg at 10/16/18 0855  . aspirin EC tablet 81 mg  81 mg Oral Daily Debbe Odea, MD   81 mg at 10/16/18 0854  . atorvastatin (LIPITOR) tablet 20 mg  20 mg Oral Daily Elgergawy, Silver Huguenin, MD   20 mg at 10/16/18 0854  . azelastine (ASTELIN) 0.1 % nasal spray 1 spray  1 spray Each Nare BID Debbe Odea, MD   1 spray at 10/16/18 0853  . azithromycin (ZITHROMAX) 500 mg in sodium chloride 0.9 % 250 mL IVPB  500 mg Intravenous Q24H Elgergawy, Silver Huguenin, MD 250 mL/hr at 10/16/18 0904 500 mg at  10/16/18 0904  . calcitonin (salmon) (MIACALCIN/FORTICAL) nasal spray 1 spray  1 spray Alternating Nares Daily Elgergawy, Silver Huguenin, MD   1 spray at 10/16/18 0853  . calcium-vitamin D (OSCAL WITH D) 500-200 MG-UNIT per tablet 1 tablet  1 tablet Oral Daily Elgergawy, Silver Huguenin, MD   1 tablet at 10/16/18 0855  . cefTRIAXone (ROCEPHIN) 1 g in sodium chloride 0.9 % 100 mL IVPB  1 g Intravenous Q24H Elgergawy, Silver Huguenin, MD 200 mL/hr at 10/16/18 1215 1 g at 10/16/18 1215  . guaiFENesin (MUCINEX) 12 hr tablet 1,200 mg  1,200 mg Oral BID Elgergawy, Silver Huguenin, MD   1,200 mg at 10/16/18 0855  . ipratropium-albuterol (DUONEB) 0.5-2.5 (3) MG/3ML nebulizer solution 3 mL  3 mL Nebulization QID Debbe Odea, MD   3 mL at 10/16/18 1128  . methylPREDNISolone sodium succinate (SOLU-MEDROL) 40 mg/mL injection 40 mg  40 mg Intravenous Q24H Debbe Odea, MD   40 mg at 10/16/18 0618  . mometasone-formoterol (DULERA) 200-5 MCG/ACT inhaler 2 puff  2 puff Inhalation BID Elgergawy, Silver Huguenin, MD   2 puff at 10/16/18 0717  . montelukast (SINGULAIR) tablet 10 mg  10 mg Oral QHS Elgergawy, Silver Huguenin, MD   10 mg at 10/15/18 2143  . multivitamin with minerals tablet 1 tablet  1 tablet Oral Daily Elgergawy, Silver Huguenin, MD   1 tablet at 10/16/18 0854  . pantoprazole (PROTONIX) injection 40 mg  40 mg Intravenous Q24H Elgergawy, Silver Huguenin, MD   40 mg at 10/16/18 1402  . polyethylene glycol (MIRALAX / GLYCOLAX) packet 17 g  17 g Oral Daily PRN Elgergawy, Silver Huguenin, MD      . polyethylene glycol (MIRALAX / GLYCOLAX) packet 17 g  17 g Oral Daily Debbe Odea, MD   17 g at 10/16/18 0853  . tamsulosin (FLOMAX) capsule 0.4 mg  0.4 mg Oral Daily Elgergawy, Silver Huguenin, MD   0.4 mg at 10/16/18 2575     Discharge Medications: Please see discharge summary for a list of discharge medications.  Relevant Imaging Results:  Relevant Lab Results:   Additional Information SSN: [id number]  Eileen Stanford, LCSW

## 2018-10-16 NOTE — Clinical Social Work Note (Signed)
Pt has accepted bed offer at [facility]. [facility] has started [facility].   [place], [place]

## 2018-10-16 NOTE — Clinical Social Work Note (Signed)
Clinical Social Work Assessment  Patient Details  Name: Bruce Travis MRN: [id number] Date of Birth: 12-Feb-1935  Date of referral:  10/16/18               Reason for consult:  Facility Placement                Permission sought to share information with:  Chartered certified accountant granted to share information::  Yes, Verbal Permission Granted  Name::     Bruce Travis::     Relationship::  Daughter  Contact Information:     Housing/Transportation Living arrangements for the past 2 months:  Single Family Home Source of Information:  Patient, Adult Children Patient Interpreter Needed:  None Criminal Activity/Legal Involvement Pertinent to Current Situation/Hospitalization:  No - Comment as needed Significant Relationships:  Adult Children, Spouse Lives with:  Spouse Do you feel safe going back to the place where you live?  No Need for family participation in patient care:  No (Coment)  Care giving concerns:  Pt is alert and oriented. Pt's daughter present at bedside.   Social Worker assessment / plan:  CSW spoke with pt and pt's daughter, Bruce Travis at bedside. Pt lives with spouse. Pt is agreeable to SNF. Pt's daughter requesting 1) [facility], 2) [facility]. CSW to follow up with facilities and provide offers.  Employment status:  Retired Nurse, adult PT Recommendations:  [place] / Referral to community resources:  [place]  Patient/Family's Response to care:  Pt verbalized understanding of CSW role and expressed appreciation for support. Pt denies any concern regarding pt care at this time.   Patient/Family's Understanding of and Emotional Response to Diagnosis, Current Treatment, and Prognosis:  Pt understanding and realistic regarding physical limitations. Pt understands the need for SNF placement at d/c. Pt agreeable to SNF placement at d/c, at this time. Pt's responses emotionally appropriate  during conversation with CSW. Pt denies any concern regarding treatment plan at this time. CSW will continue to provide support and facilitate d/c needs.   Emotional Assessment Appearance:  Appears stated age Attitude/Demeanor/Rapport:  (Pt was appropriate) Affect (typically observed):  Accepting, Appropriate, Calm Orientation:  Oriented to Self, Oriented to Place, Oriented to  Time, Oriented to Situation Alcohol / Substance use:  Not Applicable Psych involvement (Current and /or in the community):  No (Comment)  Discharge Needs  Concerns to be addressed:  Basic Needs, Care Coordination Readmission within the last 30 days:  No Current discharge risk:  Dependent with Mobility Barriers to Discharge:  Continued Medical Work up   [employer], LCSW 10/16/2018, 3:18 PM

## 2018-10-17 ENCOUNTER — Ambulatory Visit (HOSPITAL_COMMUNITY): Payer: Medicare Other

## 2018-10-17 LAB — CULTURE, BLOOD (ROUTINE X 2)
CULTURE: NO GROWTH
Culture: NO GROWTH

## 2018-10-17 LAB — CBC
HCT: 35.6 % — ABNORMAL LOW (ref 39.0–52.0)
Hemoglobin: 11.3 g/dL — ABNORMAL LOW (ref 13.0–17.0)
MCH: 32.2 pg (ref 26.0–34.0)
MCHC: 31.7 g/dL (ref 30.0–36.0)
MCV: 101.4 fL — ABNORMAL HIGH (ref 80.0–100.0)
NRBC: 0 % (ref 0.0–0.2)
Platelets: 82 10*3/uL — ABNORMAL LOW (ref 150–400)
RBC: 3.51 MIL/uL — ABNORMAL LOW (ref 4.22–5.81)
RDW: 14.6 % (ref 11.5–15.5)
WBC: 7.4 10*3/uL (ref 4.0–10.5)

## 2018-10-17 MED ORDER — ALBUTEROL SULFATE (2.5 MG/3ML) 0.083% IN NEBU: 2.5000 mg | INHALATION_SOLUTION | RESPIRATORY_TRACT | Status: DC | PRN

## 2018-10-17 MED ORDER — AZITHROMYCIN 250 MG PO TABS: 250.0000 mg | ORAL_TABLET | Freq: Every day | ORAL | 0 refills | Status: DC

## 2018-10-17 MED ORDER — PREDNISONE 10 MG PO TABS: 60.0000 mg | ORAL_TABLET | Freq: Every day | ORAL | 0 refills | Status: DC

## 2018-10-17 MED ORDER — ACETAMINOPHEN 325 MG PO TABS: 650.0000 mg | ORAL_TABLET | Freq: Four times a day (QID) | ORAL | Status: AC | PRN

## 2018-10-17 MED ORDER — CEFUROXIME AXETIL 500 MG PO TABS: 500.0000 mg | ORAL_TABLET | Freq: Two times a day (BID) | ORAL | 0 refills | Status: DC

## 2018-10-17 MED ORDER — BISACODYL 5 MG PO TBEC
10.0000 mg | DELAYED_RELEASE_TABLET | Freq: Once | ORAL | Status: AC
  Administered 2018-10-17: 10 mg via ORAL
  Filled 2018-10-17: qty 2

## 2018-10-17 MED ORDER — HYDROCODONE-HOMATROPINE 5-1.5 MG/5ML PO SYRP: 5.0000 mL | ORAL_SOLUTION | Freq: Four times a day (QID) | ORAL | 0 refills | Status: DC | PRN

## 2018-10-17 MED ORDER — IPRATROPIUM-ALBUTEROL 0.5-2.5 (3) MG/3ML IN SOLN
3.0000 mL | Freq: Three times a day (TID) | RESPIRATORY_TRACT | Status: DC
  Administered 2018-10-17 – 2018-10-18 (×5): 3 mL via RESPIRATORY_TRACT
  Filled 2018-10-17 (×5): qty 3

## 2018-10-17 NOTE — Clinical Social Work Note (Addendum)
Facility has not yet received insurance authorization from [facility]. Pt's daughter at bedside updated.  [place], [place]

## 2018-10-17 NOTE — Discharge Summary (Signed)
Physician Discharge Summary  Bruce Travis ATF:[id number] DOB: 1935-05-13 DOA: 10/12/2018  PCP: Susy Frizzle, MD  Admit date: 10/12/2018 Discharge date: 10/17/2018  Admitted From: home Disposition:  SNF   Recommendations for Outpatient Follow-up:  1. Wean O2 as able 2. Stop antibiotics in 2 days 3. CBC in 3 days  Discharge Condition:  stable   CODE STATUS:  DNR   Diet recommendation:  Heart healthy Consultations:  none    Discharge Diagnoses:  Principal Problem:   Acute respiratory failure with hypoxia-   COPD exacerbation (HCC) Active Problems:   Chronic respiratory failure with hypoxia (HCC)   CAP (community acquired pneumonia)   GERD   PVC's (premature ventricular contractions)   Cardiomyopathy, ischemic   CAD (coronary artery disease)   Chronic kidney disease (CKD), stage III (moderate) ([place])    Brief Summary: [name] is a89 y.o.male,with chronic respiratory failure on home 3 L nasal cannula at rest, 6 L on exertion, chronic diastolic CHF, chronic systolic CHF, CKD stage III, GERD, COPD, presents secondary to dyspnea. The patient reports shortness of breath last week and was seen by PCP. He was given a prednisone taper, doxycycline noted an improvement of symptoms and completed his course. He reports on Tuesday he started to have progressive dyspnea, mainly on exertion, then at rest.  He also reports cough which is productive with yellow phlegm. Chest x-ray in the ED was suggestive of bilateral lower lobe pneumonia versus atelectasis  Hospital Course:  Principal Problem:   Acute respiratory failure with hypoxia/COPD exacerbation/community-acquired pneumonia -The patient was admitted and placed on BiPAP- he is no longer needing BiPAP and has been weaned to about 4 L on nasal cannula- pulse ox is 94% at rest - He is receiving azithromycin and ceftriaxone which will be continued -Pneumonia antigen is negative  - PT eval ordered- suspect he will need SNF as he is  very deconditioned - he remains very congested but is stable to transfer to SNF -- cont mucinex and flutter valve- added Mucomyst nebs  Active Problems:   GERD -Continue Protonix    PVC's (premature ventricular contractions)   Cardiomyopathy, ischemic -Continue amiodarone and aspirin   Chronic kidney disease (CKD), stage II(moderate) Creatinine 1.33 on admission has improved to 1.03  Thrombocytopenia  - acute possibly related to sepsis- follow intermittently as outpt    Discharge Exam: Vitals:   10/17/18 0829 10/17/18 0838  BP:    Pulse:    Resp:    Temp:    SpO2: 94% 94%   Vitals:   10/17/18 0816 10/17/18 0824 10/17/18 0829 10/17/18 0838  BP: 123/67     Pulse: 60     Resp: 18     Temp: 97.8 F (36.6 C)     TempSrc: Oral     SpO2: 97% 94% 94% 94%  Weight:      Height:        General: Pt is alert, awake, not in acute distress Cardiovascular: RRR, S1/S2 +, no rubs, no gallops Respiratory: CTA bilaterally, no wheezing, no rhonchi Abdominal: Soft, NT, ND, bowel sounds + Extremities: no edema, no cyanosis   Discharge Instructions  Discharge Instructions    Diet - low sodium heart healthy   Complete by:  As directed    Increase activity slowly   Complete by:  As directed      Allergies as of 10/17/2018      Reactions   Codeine Other (See Comments)   Thought head was going  to blow off/ severe headache   Gluten Meal Other (See Comments)   Celiac disease   Adhesive [tape] Other (See Comments)   Band-aids - tears skin off      Medication List    STOP taking these medications   doxycycline 100 MG tablet Commonly known as:  VIBRA-TABS   HYDROcodone-acetaminophen 5-325 MG tablet Commonly known as:  NORCO   OXYGEN   predniSONE 5 MG tablet Commonly known as:  DELTASONE Replaced by:  predniSONE 10 MG tablet   tiZANidine 4 MG tablet Commonly known as:  ZANAFLEX     TAKE these medications   acetaminophen 325 MG tablet Commonly known as:   TYLENOL Take 2 tablets (650 mg total) by mouth every 6 (six) hours as needed for fever.   albuterol 108 (90 Base) MCG/ACT inhaler Commonly known as:  PROAIR HFA INHALE 2 PUFFS EVERY 4 HOURS AS NEEDED FOR SHORTNESS OF BREATH. What changed:    how much to take  how to take this  when to take this  reasons to take this  additional instructions   albuterol (2.5 MG/3ML) 0.083% nebulizer solution Commonly known as:  PROVENTIL Take 3 mLs (2.5 mg total) by nebulization every 4 (four) hours as needed for wheezing or shortness of breath. What changed:  Another medication with the same name was changed. Make sure you understand how and when to take each.   ALPRAZolam 0.25 MG tablet Commonly known as:  XANAX TAKE ONE TABLET BY MOUTH TWICE A DAY AS NEEDED FOR ANXIETY. What changed:  See the new instructions.   amiodarone 200 MG tablet Commonly known as:  PACERONE TAKE 1/2 TABLET BY MOUTH ONCE DAILY.   aspirin EC 81 MG tablet Take 81 mg by mouth daily.   atorvastatin 20 MG tablet Commonly known as:  LIPITOR Take 1 tablet (20 mg total) by mouth daily.   azelastine 0.1 % nasal spray Commonly known as:  ASTELIN PLACE 2 SPRAYS INTO EACH NOSTRILS TWICE DAILY AS DIRECTED. What changed:    how much to take  how to take this  when to take this  additional instructions   budesonide-formoterol 160-4.5 MCG/ACT inhaler Commonly known as:  SYMBICORT INHALE 2 PUFFS FIRST THING IN MORNING AND THEN ANOTHER 2 PUFFS ABOUT 12 HOURS LATER. What changed:    how much to take  how to take this  when to take this  additional instructions   calcitonin (salmon) 200 UNIT/ACT nasal spray Commonly known as:  MIACALCIN Place 1 spray into alternate nostrils daily.   CALTRATE 600+D 600-400 MG-UNIT tablet Generic drug:  Calcium Carbonate-Vitamin D Take 1 tablet by mouth daily.   CENTRUM SILVER 50+MEN Tabs Take 1 tablet by mouth daily.   dextromethorphan-guaiFENesin 30-600 MG 12hr  tablet Commonly known as:  MUCINEX DM Take 1 tablet by mouth 2 (two) times daily.   ENTRESTO 49-51 MG Generic drug:  sacubitril-valsartan TAKE (1) TABLET BY MOUTH TWICE DAILY. What changed:  See the new instructions.   HYDROcodone-homatropine 5-1.5 MG/5ML syrup Commonly known as:  HYCODAN Take 5 mLs by mouth every 6 (six) hours as needed for cough.   LUTEIN PO Take 1 capsule by mouth daily.   montelukast 10 MG tablet Commonly known as:  SINGULAIR TAKE 1 TABLET BY MOUTH DAILY. What changed:  when to take this   pantoprazole 40 MG tablet Commonly known as:  PROTONIX TAKE ONE TABLET BY MOUTH DAILY.   polyethylene glycol packet Commonly known as:  MIRALAX / GLYCOLAX Take 17  g by mouth daily as needed for mild constipation. Mix in 8 oz liquid and drink   predniSONE 10 MG tablet Commonly known as:  DELTASONE Take 6 tablets (60 mg total) by mouth daily with breakfast. Take 6 tabs tomorrow and then decrease by 1 tab daily until finished Replaces:  predniSONE 5 MG tablet   SPIRIVA RESPIMAT 2.5 MCG/ACT Aers Generic drug:  Tiotropium Bromide Monohydrate INHALE 2 PUFFS INTO THE LUNGS ONCE DAILY. What changed:  See the new instructions.   spironolactone 25 MG tablet Commonly known as:  ALDACTONE TAKE (1/2) TABLET BY MOUTH DAILY. What changed:  See the new instructions.   tamsulosin 0.4 MG Caps capsule Commonly known as:  FLOMAX TAKE ONE CAPSULE BY MOUTH ONCE DAILY.       Allergies  Allergen Reactions  . Codeine Other (See Comments)    Thought head was going to blow off/ severe headache  . Gluten Meal Other (See Comments)    Celiac disease  . Adhesive [Tape] Other (See Comments)    Band-aids - tears skin off     Procedures/Studies:    Dg Chest Port 1 View  Result Date: 10/12/2018 CLINICAL DATA:  CHF.  Short of breath.  Fever and chills. EXAM: PORTABLE CHEST 1 VIEW COMPARISON:  07/18/2018 FINDINGS: Normal heart size. There is aortic atherosclerosis. The advanced  changes of COPD/emphysema. Bilateral lower lobe opacities are identified, left greater than right. Bilateral calcified granulomas are again noted. IMPRESSION: 1. Bilateral lower lobe opacities compatible with atelectasis and or pneumonia. 2. Advanced changes of emphysema and aortic atherosclerosis. Aortic Atherosclerosis (ICD10-I70.0) and Emphysema (ICD10-J43.9). Electronically Signed   By: Kerby Moors M.D.   On: 10/12/2018 11:24     The results of significant diagnostics from this hospitalization (including imaging, microbiology, ancillary and laboratory) are listed below for reference.     Microbiology: Recent Results (from the past 240 hour(s))  Blood Culture (routine x 2)     Status: None (Preliminary result)   Collection Time: 10/12/18 10:52 AM  Result Value Ref Range Status   Specimen Description BLOOD RIGHT HAND  Final   Special Requests   Final    BOTTLES DRAWN AEROBIC AND ANAEROBIC Blood Culture results may not be optimal due to an inadequate volume of blood received in culture bottles   Culture   Final    NO GROWTH 4 DAYS Performed at [facility], [place] [address] [place], [place] [address]    Report Status PENDING  Incomplete  Blood Culture (routine x 2)     Status: None (Preliminary result)   Collection Time: 10/12/18 10:58 AM  Result Value Ref Range Status   Specimen Description BLOOD LEFT HAND  Final   Special Requests   Final    BOTTLES DRAWN AEROBIC AND ANAEROBIC Blood Culture results may not be optimal due to an inadequate volume of blood received in culture bottles   Culture   Final    NO GROWTH 4 DAYS Performed at [facility], [place] [address] [address], [place], [place] [address]    Report Status PENDING  Incomplete  MRSA PCR Screening     Status: None   Collection Time: 10/13/18 11:50 AM  Result Value Ref Range Status   MRSA by PCR NEGATIVE NEGATIVE Final    Comment:        The GeneXpert MRSA Assay (FDA approved for NASAL specimens only), is one component  of a comprehensive MRSA colonization surveillance program. It is not intended to diagnose  MRSA infection nor to guide or monitor treatment for MRSA infections. Performed at [facility], [place] [address] [place], [place] [address]      Labs: BNP (last 3 results) Recent Labs    01/05/18 1235 01/09/18 1032  BNP 67 47   Basic Metabolic Panel: Recent Labs  Lab 10/12/18 1047 10/12/18 1132 10/13/18 0322 10/14/18 0212  NA 135 134* 135 137  K 4.2 3.9 4.1 4.2  CL 100  --  106 107  CO2 23  --  20* 19*  GLUCOSE 105*  --  127* 142*  BUN 26*  --  24* 30*  CREATININE 1.33*  --  1.03 1.15  CALCIUM 8.9  --  8.4* 8.7*   Liver Function Tests: Recent Labs  Lab 10/12/18 1047  AST 32  ALT 34  ALKPHOS 51  BILITOT 1.3*  PROT 5.8*  ALBUMIN 3.4*   No results for input(s): LIPASE, AMYLASE in the last 168 hours. No results for input(s): AMMONIA in the last 168 hours. CBC: Recent Labs  Lab 10/12/18 1047 10/12/18 1132 10/13/18 0322 10/14/18 0212 10/16/18 1003 10/17/18 0222  WBC 15.0*  --  9.7 11.6* 8.5 7.4  NEUTROABS 12.1*  --   --   --   --   --   HGB 15.4 14.3 13.2 12.4* 12.9* 11.3*  HCT 49.6 42.0 41.2 37.6* 40.3 35.6*  MCV 104.2*  --  101.7* 101.3* 101.8* 101.4*  PLT 123*  --  99* 94* 94* 82*   Cardiac Enzymes: No results for input(s): CKTOTAL, CKMB, CKMBINDEX, TROPONINI in the last 168 hours. BNP: Invalid input(s): POCBNP CBG: No results for input(s): GLUCAP in the last 168 hours. D-Dimer No results for input(s): DDIMER in the last 72 hours. Hgb A1c No results for input(s): HGBA1C in the last 72 hours. Lipid Profile No results for input(s): CHOL, HDL, LDLCALC, TRIG, CHOLHDL, LDLDIRECT in the last 72 hours. Thyroid function studies No results for input(s): TSH, T4TOTAL, T3FREE, THYROIDAB in the last 72 hours.  Invalid input(s): FREET3 Anemia work up No results for input(s): VITAMINB12, FOLATE, FERRITIN, TIBC, IRON, RETICCTPCT in the last 72  hours. Urinalysis    Component Value Date/Time   COLORURINE YELLOW 10/12/2018 1420   APPEARANCEUR HAZY (A) 10/12/2018 1420   LABSPEC 1.019 10/12/2018 1420   PHURINE 5.0 10/12/2018 1420   GLUCOSEU NEGATIVE 10/12/2018 1420   GLUCOSEU NEGATIVE 09/01/2006 1033   HGBUR MODERATE (A) 10/12/2018 1420   BILIRUBINUR NEGATIVE 10/12/2018 1420   KETONESUR 5 (A) 10/12/2018 1420   PROTEINUR NEGATIVE 10/12/2018 1420   UROBILINOGEN 0.2 01/10/2012 1730   NITRITE NEGATIVE 10/12/2018 1420   LEUKOCYTESUR NEGATIVE 10/12/2018 1420   Sepsis Labs Invalid input(s): PROCALCITONIN,  WBC,  LACTICIDVEN Microbiology Recent Results (from the past 240 hour(s))  Blood Culture (routine x 2)     Status: None (Preliminary result)   Collection Time: 10/12/18 10:52 AM  Result Value Ref Range Status   Specimen Description BLOOD RIGHT HAND  Final   Special Requests   Final    BOTTLES DRAWN AEROBIC AND ANAEROBIC Blood Culture results may not be optimal due to an inadequate volume of blood received in culture bottles   Culture   Final    NO GROWTH 4 DAYS Performed at [facility], [place] [address], [place], [place] [address]    Report Status PENDING  Incomplete  Blood Culture (routine x 2)     Status: None (Preliminary result)   Collection Time: 10/12/18 10:58 AM  Result Value Ref Range Status   Specimen Description BLOOD LEFT HAND  Final   Special Requests   Final    BOTTLES DRAWN AEROBIC AND ANAEROBIC Blood Culture results may not be optimal due to an inadequate volume of blood received in culture bottles   Culture   Final    NO GROWTH 4 DAYS Performed at [place] [address] [place], [place] [address]    Report Status PENDING  Incomplete  MRSA PCR Screening     Status: None   Collection Time: 10/13/18 11:50 AM  Result Value Ref Range Status   MRSA by PCR NEGATIVE NEGATIVE Final    Comment:        The GeneXpert MRSA Assay (FDA approved for NASAL specimens only), is one component of  a comprehensive MRSA colonization surveillance program. It is not intended to diagnose MRSA infection nor to guide or monitor treatment for MRSA infections. Performed at [facility], [place] [address], [place], [place] [address]      Time coordinating discharge in minutes: 73  SIGNED:   Debbe Odea, MD  [facility] 10/17/2018, 10:22 AM Pager   If 7PM-7AM, please contact night-coverage [email or web address] Password [email or web address]

## 2018-10-18 ENCOUNTER — Inpatient Hospital Stay
Admission: RE | Admit: 2018-10-18 | Discharge: 2018-11-01 | Disposition: A | Discharge status: Home / Self Care | Payer: Medicare Other | Source: Ambulatory Visit | Attending: Internal Medicine | Admitting: Internal Medicine

## 2018-10-18 ENCOUNTER — Telehealth: Payer: Self-pay | Admitting: Family Medicine

## 2018-10-18 MED ORDER — ALPRAZOLAM 0.25 MG PO TABS: 0.2500 mg | ORAL_TABLET | Freq: Two times a day (BID) | ORAL | 0 refills | Status: DC | PRN

## 2018-10-18 MED ORDER — HYDROCODONE-HOMATROPINE 5-1.5 MG/5ML PO SYRP: 5.0000 mL | ORAL_SOLUTION | Freq: Four times a day (QID) | ORAL | 0 refills | Status: DC | PRN

## 2018-10-18 NOTE — Telephone Encounter (Signed)
Pt just wanted to let us know that he was being transferred to the [facility]

## 2018-10-18 NOTE — Clinical Social Work Note (Signed)
Clinical Social Worker facilitated patient discharge including contacting patient family and facility to confirm patient discharge plans.  Clinical information faxed to facility and family agreeable with plan.  CSW arranged ambulance transport via [place] to [facility] .  RN to call [phone] for report prior to discharge.  Clinical Social Worker will sign off for now as social work intervention is no longer needed. Please consult [place] again if new need arises.  [place], [place]

## 2018-10-18 NOTE — Progress Notes (Signed)
Bruce Travis FIE:[id number] DOB: 1935-05-27 DOA: 10/12/2018  PCP: Bruce Frizzle, MD  Admit date: 10/12/2018 Discharge date: 10/18/2018  Admitted From: SNF Disposition:  SNF   Patient is being transferred to the skilled nursing facility today, will no longer need antibiotic therapy, his chest imaging were personally reviewed, he does have chronic fibrosis at bases.  Continue supplemental oxygen per nasal cannula and bronchodilator therapy.  Steroids have also been discontinued.  Continue pulmonary rehabilitation.  Per doctor Bruce Travis:   "Brief Summary: Bruce Travis a83 y.o.male,with chronic respiratory failure on home 3 L nasal cannula at rest, 6 L on exertion, chronic diastolic CHF, chronic systolic CHF, CKD stage III, GERD, COPD, presents secondary to dyspnea. The patient reports shortness of breath last week and was seen by PCP. He was given a prednisone taper, doxycycline noted an improvement of symptoms and completed his course. He reports on Tuesday he started to have progressive dyspnea, mainly on exertion, then at rest. He also reports cough which is productive with yellow phlegm. Chest x-ray in the ED was suggestive of bilateral lower lobe pneumonia versus atelectasis  Hospital Course:  Principal Problem:  Acute respiratory failure with hypoxia/COPD exacerbation/community-acquired pneumonia -The patient was admitted and placed on BiPAP- he is no longer needing BiPAP and has been weaned to about 4 L on nasal cannula- pulse ox is 94% at rest - He is receiving azithromycin and ceftriaxone which will be continued -Pneumonia antigen is negative  - PT eval ordered- suspect he will need SNF as he is very deconditioned - he remains very congested but is stable to transfer to SNF -- cont mucinex and flutter valve- added Mucomyst nebs  Active Problems: GERD -Continue Protonix  PVC's (premature ventricular contractions) Cardiomyopathy, ischemic -Continue amiodaroneand  aspirin  Chronic kidney disease (CKD), stage II(moderate) Creatinine 1.33 on admission has improved to 1.03  Thrombocytopenia - acute possibly related to sepsis- follow intermittently as outpt"    Discharge Diagnoses:  Principal Problem:   Acute respiratory failure with hypoxia (Bruce Travis) Active Problems:   GERD   COPD exacerbation (HCC)   PVC's (premature ventricular contractions)   Cardiomyopathy, ischemic   CAD (coronary artery disease)   Chronic kidney disease (CKD), stage III (moderate) (HCC)   Chronic respiratory failure with hypoxia (Bruce Travis)   CAP (community acquired pneumonia)    Discharge Instructions  Discharge Instructions    Diet - low sodium heart healthy   Complete by:  As directed    Increase activity slowly   Complete by:  As directed      Allergies as of 10/18/2018      Reactions   Codeine Other (See Comments)   Thought head was going to blow off/ severe headache   Gluten Meal Other (See Comments)   Celiac disease   Adhesive [tape] Other (See Comments)   Band-aids - tears skin off      Medication List    STOP taking these medications   doxycycline 100 MG tablet Commonly known as:  VIBRA-TABS   HYDROcodone-acetaminophen 5-325 MG tablet Commonly known as:  NORCO   predniSONE 5 MG tablet Commonly known as:  DELTASONE   tiZANidine 4 MG tablet Commonly known as:  ZANAFLEX     TAKE these medications   acetaminophen 325 MG tablet Commonly known as:  TYLENOL Take 2 tablets (650 mg total) by mouth every 6 (six) hours as needed for fever.   albuterol 108 (90 Base) MCG/ACT inhaler Commonly known as:  PROAIR HFA INHALE 2 PUFFS  EVERY 4 HOURS AS NEEDED FOR SHORTNESS OF BREATH. What changed:    how much to take  how to take this  when to take this  reasons to take this  additional instructions   albuterol (2.5 MG/3ML) 0.083% nebulizer solution Commonly known as:  PROVENTIL Take 3 mLs (2.5 mg total) by nebulization every 4 (four) hours as  needed for wheezing or shortness of breath. What changed:  Another medication with the same name was changed. Make sure you understand how and when to take each.   ALPRAZolam 0.25 MG tablet Commonly known as:  XANAX Take 1 tablet (0.25 mg total) by mouth 2 (two) times daily as needed for anxiety. What changed:  See the new instructions.   amiodarone 200 MG tablet Commonly known as:  PACERONE TAKE 1/2 TABLET BY MOUTH ONCE DAILY.   aspirin EC 81 MG tablet Take 81 mg by mouth daily.   atorvastatin 20 MG tablet Commonly known as:  LIPITOR Take 1 tablet (20 mg total) by mouth daily.   azelastine 0.1 % nasal spray Commonly known as:  ASTELIN PLACE 2 SPRAYS INTO EACH NOSTRILS TWICE DAILY AS DIRECTED. What changed:    how much to take  how to take this  when to take this  additional instructions   budesonide-formoterol 160-4.5 MCG/ACT inhaler Commonly known as:  SYMBICORT INHALE 2 PUFFS FIRST THING IN MORNING AND THEN ANOTHER 2 PUFFS ABOUT 12 HOURS LATER. What changed:    how much to take  how to take this  when to take this  additional instructions   calcitonin (salmon) 200 UNIT/ACT nasal spray Commonly known as:  MIACALCIN Place 1 spray into alternate nostrils daily.   CALTRATE 600+D 600-400 MG-UNIT tablet Generic drug:  Calcium Carbonate-Vitamin D Take 1 tablet by mouth daily.   CENTRUM SILVER 50+MEN Tabs Take 1 tablet by mouth daily.   dextromethorphan-guaiFENesin 30-600 MG 12hr tablet Commonly known as:  MUCINEX DM Take 1 tablet by mouth 2 (two) times daily.   ENTRESTO 49-51 MG Generic drug:  sacubitril-valsartan TAKE (1) TABLET BY MOUTH TWICE DAILY. What changed:  See the new instructions.   HYDROcodone-homatropine 5-1.5 MG/5ML syrup Commonly known as:  HYCODAN Take 5 mLs by mouth every 6 (six) hours as needed for cough.   LUTEIN PO Take 1 capsule by mouth daily.   montelukast 10 MG tablet Commonly known as:  SINGULAIR TAKE 1 TABLET BY MOUTH  DAILY. What changed:  when to take this   OXYGEN Inhale 3 L into the lungs See admin instructions. 3lpm with rest and 6 lpm with exertion continuous   pantoprazole 40 MG tablet Commonly known as:  PROTONIX TAKE ONE TABLET BY MOUTH DAILY.   polyethylene glycol packet Commonly known as:  MIRALAX / GLYCOLAX Take 17 g by mouth daily as needed for mild constipation. Mix in 8 oz liquid and drink   SPIRIVA RESPIMAT 2.5 MCG/ACT Aers Generic drug:  Tiotropium Bromide Monohydrate INHALE 2 PUFFS INTO THE LUNGS ONCE DAILY. What changed:  See the new instructions.   spironolactone 25 MG tablet Commonly known as:  ALDACTONE TAKE (1/2) TABLET BY MOUTH DAILY. What changed:  See the new instructions.   tamsulosin 0.4 MG Caps capsule Commonly known as:  FLOMAX TAKE ONE CAPSULE BY MOUTH ONCE DAILY.      Contact information for after-discharge care    [facility] .   Service:  Skilled Nursing Contact information: [address] [place]  [place] [address] [phone]             Allergies  Allergen Reactions  . Codeine Other (See Comments)    Thought head was going to blow off/ severe headache  . Gluten Meal Other (See Comments)    Celiac disease  . Adhesive [Tape] Other (See Comments)    Band-aids - tears skin off    Consultations:     Procedures/Studies: Dg Chest Port 1 View  Result Date: 10/12/2018 CLINICAL DATA:  CHF.  Short of breath.  Fever and chills. EXAM: PORTABLE CHEST 1 VIEW COMPARISON:  07/18/2018 FINDINGS: Normal heart size. There is aortic atherosclerosis. The advanced changes of COPD/emphysema. Bilateral lower lobe opacities are identified, left greater than right. Bilateral calcified granulomas are again noted. IMPRESSION: 1. Bilateral lower lobe opacities compatible with atelectasis and or pneumonia. 2. Advanced changes of emphysema and aortic atherosclerosis. Aortic Atherosclerosis (ICD10-I70.0) and  Emphysema (ICD10-J43.9). Electronically Signed   By: Kerby Moors M.D.   On: 10/12/2018 11:24       Subjective: Patient is feeling better, dyspnea has been improving, continue to feel very weak and deconditioned.   Discharge Exam: Vitals:   10/18/18 0749 10/18/18 0851  BP:  135/80  Pulse:  78  Resp:  (!) 22  Temp:  97.8 F (36.6 C)  SpO2: 94% 93%   Vitals:   10/17/18 1945 10/17/18 2335 10/18/18 0749 10/18/18 0851  BP:  (!) 145/76  135/80  Pulse:  73  78  Resp:  17  (!) 22  Temp:  98.5 F (36.9 C)  97.8 F (36.6 C)  TempSrc:  Oral  Oral  SpO2: 91% 94% 94% 93%  Weight:      Height:        General: deconditioned  Neurology: Awake and alert, non focal  E ENT: mild pallor, no icterus, oral mucosa moist Cardiovascular: No JVD. S1-S2 present, rhythmic, no gallops, rubs, or murmurs. No lower extremity edema. Pulmonary: decreased breath sounds bilaterally, poor air movement, no wheezing, scattered rhonchi or rales. Gastrointestinal. Abdomen with no organomegaly, non tender, no rebound or guarding Skin. No rashes Musculoskeletal: no joint deformities   The results of significant diagnostics from this hospitalization (including imaging, microbiology, ancillary and laboratory) are listed below for reference.     Microbiology: Recent Results (from the past 240 hour(s))  Blood Culture (routine x 2)     Status: None   Collection Time: 10/12/18 10:52 AM  Result Value Ref Range Status   Specimen Description BLOOD RIGHT HAND  Final   Special Requests   Final    BOTTLES DRAWN AEROBIC AND ANAEROBIC Blood Culture results may not be optimal due to an inadequate volume of blood received in culture bottles   Culture   Final    NO GROWTH 5 DAYS Performed at [facility], [place] [address] [place], [place] [address]    Report Status 10/17/2018 FINAL  Final  Blood Culture (routine x 2)     Status: None   Collection Time: 10/12/18 10:58 AM  Result Value Ref Range Status    Specimen Description BLOOD LEFT HAND  Final   Special Requests   Final    BOTTLES DRAWN AEROBIC AND ANAEROBIC Blood Culture results may not be optimal due to an inadequate volume of blood received in culture bottles   Culture   Final    NO GROWTH 5 DAYS Performed at [facility], [place] [address], [place], [place] [address]    Report  Status 10/17/2018 FINAL  Final  MRSA PCR Screening     Status: None   Collection Time: 10/13/18 11:50 AM  Result Value Ref Range Status   MRSA by PCR NEGATIVE NEGATIVE Final    Comment:        The GeneXpert MRSA Assay (FDA approved for NASAL specimens only), is one component of a comprehensive MRSA colonization surveillance program. It is not intended to diagnose MRSA infection nor to guide or monitor treatment for MRSA infections. Performed at [facility], [place] [address], [place], [place] [address]      Labs: BNP (last 3 results) Recent Labs    01/05/18 1235 01/09/18 1032  BNP 67 47   Basic Metabolic Panel: Recent Labs  Lab 10/12/18 1047 10/12/18 1132 10/13/18 0322 10/14/18 0212  NA 135 134* 135 137  K 4.2 3.9 4.1 4.2  CL 100  --  106 107  CO2 23  --  20* 19*  GLUCOSE 105*  --  127* 142*  BUN 26*  --  24* 30*  CREATININE 1.33*  --  1.03 1.15  CALCIUM 8.9  --  8.4* 8.7*   Liver Function Tests: Recent Labs  Lab 10/12/18 1047  AST 32  ALT 34  ALKPHOS 51  BILITOT 1.3*  PROT 5.8*  ALBUMIN 3.4*   No results for input(s): LIPASE, AMYLASE in the last 168 hours. No results for input(s): AMMONIA in the last 168 hours. CBC: Recent Labs  Lab 10/12/18 1047 10/12/18 1132 10/13/18 0322 10/14/18 0212 10/16/18 1003 10/17/18 0222  WBC 15.0*  --  9.7 11.6* 8.5 7.4  NEUTROABS 12.1*  --   --   --   --   --   HGB 15.4 14.3 13.2 12.4* 12.9* 11.3*  HCT 49.6 42.0 41.2 37.6* 40.3 35.6*  MCV 104.2*  --  101.7* 101.3* 101.8* 101.4*  PLT 123*  --  99* 94* 94* 82*   Cardiac Enzymes: No results for input(s): CKTOTAL, CKMB,  CKMBINDEX, TROPONINI in the last 168 hours. BNP: Invalid input(s): POCBNP CBG: No results for input(s): GLUCAP in the last 168 hours. D-Dimer No results for input(s): DDIMER in the last 72 hours. Hgb A1c No results for input(s): HGBA1C in the last 72 hours. Lipid Profile No results for input(s): CHOL, HDL, LDLCALC, TRIG, CHOLHDL, LDLDIRECT in the last 72 hours. Thyroid function studies No results for input(s): TSH, T4TOTAL, T3FREE, THYROIDAB in the last 72 hours.  Invalid input(s): FREET3 Anemia work up No results for input(s): VITAMINB12, FOLATE, FERRITIN, TIBC, IRON, RETICCTPCT in the last 72 hours. Urinalysis    Component Value Date/Time   COLORURINE YELLOW 10/12/2018 1420   APPEARANCEUR HAZY (A) 10/12/2018 1420   LABSPEC 1.019 10/12/2018 1420   PHURINE 5.0 10/12/2018 1420   GLUCOSEU NEGATIVE 10/12/2018 1420   GLUCOSEU NEGATIVE 09/01/2006 1033   HGBUR MODERATE (A) 10/12/2018 1420   BILIRUBINUR NEGATIVE 10/12/2018 1420   KETONESUR 5 (A) 10/12/2018 1420   PROTEINUR NEGATIVE 10/12/2018 1420   UROBILINOGEN 0.2 01/10/2012 1730   NITRITE NEGATIVE 10/12/2018 1420   LEUKOCYTESUR NEGATIVE 10/12/2018 1420   Sepsis Labs Invalid input(s): PROCALCITONIN,  WBC,  LACTICIDVEN Microbiology Recent Results (from the past 240 hour(s))  Blood Culture (routine x 2)     Status: None   Collection Time: 10/12/18 10:52 AM  Result Value Ref Range Status   Specimen Description BLOOD RIGHT HAND  Final   Special Requests   Final    BOTTLES DRAWN AEROBIC AND ANAEROBIC Blood Culture results  may not be optimal due to an inadequate volume of blood received in culture bottles   Culture   Final    NO GROWTH 5 DAYS Performed at [facility], [place] [address], [place], [place] [address]    Report Status 10/17/2018 FINAL  Final  Blood Culture (routine x 2)     Status: None   Collection Time: 10/12/18 10:58 AM  Result Value Ref Range Status   Specimen Description BLOOD LEFT HAND  Final   Special  Requests   Final    BOTTLES DRAWN AEROBIC AND ANAEROBIC Blood Culture results may not be optimal due to an inadequate volume of blood received in culture bottles   Culture   Final    NO GROWTH 5 DAYS Performed at [facility], [place] [address] [place], [place] [address]    Report Status 10/17/2018 FINAL  Final  MRSA PCR Screening     Status: None   Collection Time: 10/13/18 11:50 AM  Result Value Ref Range Status   MRSA by PCR NEGATIVE NEGATIVE Final    Comment:        The GeneXpert MRSA Assay (FDA approved for NASAL specimens only), is one component of a comprehensive MRSA colonization surveillance program. It is not intended to diagnose MRSA infection nor to guide or monitor treatment for MRSA infections. Performed at [facility], [place] [address], [place], [place] [address]      Time coordinating discharge: 45 minutes  SIGNED:   Tawni Millers, MD  [facility] 10/18/2018, 9:57 AM

## 2018-10-18 NOTE — Clinical Social Work Placement (Signed)
   CLINICAL SOCIAL WORK PLACEMENT  NOTE  Date:  10/18/2018  Patient Details  Name: Bruce Travis MRN: [id number] Date of Birth: 03/01/1935  Clinical Social Work is seeking post-discharge placement for this patient at the [place] level of care (*CSW will initial, date and re-position this form in  chart as items are completed):      Patient/family provided with [place] Work Department's list of facilities offering this level of care within the geographic area requested by the patient (or if unable, by the patient's family).  Yes   Patient/family informed of their freedom to choose among providers that offer the needed level of care, that participate in Medicare, Medicaid or managed care program needed by the patient, have an available bed and are willing to accept the patient.      Patient/family informed of [facility]'s ownership interest in [facility] and [facility], as well as of the fact that they are under no obligation to receive care at these facilities.  PASRR submitted to EDS on       PASRR number received on 10/16/18     Existing PASRR number confirmed on       FL2 transmitted to all facilities in geographic area requested by pt/family on 10/16/18     FL2 transmitted to all facilities within larger geographic area on       Patient informed that his/her managed care company has contracts with or will negotiate with certain facilities, including the following:        Yes   Patient/family informed of bed offers received.  Patient chooses bed at [facility]     Physician recommends and patient chooses bed at      Patient to be transferred to [facility] on 10/18/18.  Patient to be transferred to facility by [facility]     Patient family notified on 10/18/18 of transfer.  Name of family member notified:  [name], daughter     PHYSICIAN Please prepare priority discharge summary, including medications, Please prepare  prescriptions, Please sign FL2, Please sign DNR     Additional Comment:    _______________________________________________ Eileen Stanford, LCSW 10/18/2018, 12:06 PM

## 2018-10-18 NOTE — Telephone Encounter (Signed)
Patient calling to speak with  [facility]  Would like you to call him asap at [phone]

## 2018-10-18 NOTE — Progress Notes (Signed)
Report given to Mo RN.

## 2018-10-18 NOTE — Clinical Social Work Note (Signed)
[facility] has received authorization from [facility]. CSW has submitted additional documents to PASARR, awaiting number prior to pt d/c to SNF.   [place], [place]

## 2018-10-19 ENCOUNTER — Ambulatory Visit (HOSPITAL_COMMUNITY): Payer: Medicare Other

## 2018-10-19 ENCOUNTER — Encounter: Payer: Self-pay | Admitting: Internal Medicine

## 2018-10-19 ENCOUNTER — Other Ambulatory Visit (HOSPITAL_COMMUNITY)
Admission: RE | Admit: 2018-10-19 | Discharge: 2018-10-19 | Disposition: A | Discharge status: Home / Self Care | Payer: Medicare Other | Source: Skilled Nursing Facility | Attending: Internal Medicine | Admitting: Internal Medicine

## 2018-10-19 ENCOUNTER — Non-Acute Institutional Stay (SKILLED_NURSING_FACILITY): Payer: Medicare Other | Admitting: Internal Medicine

## 2018-10-19 ENCOUNTER — Other Ambulatory Visit: Payer: Self-pay | Admitting: Adult Health

## 2018-10-19 DIAGNOSIS — J441 Chronic obstructive pulmonary disease with (acute) exacerbation: Secondary | ICD-10-CM

## 2018-10-19 DIAGNOSIS — J101 Influenza due to other identified influenza virus with other respiratory manifestations: Secondary | ICD-10-CM

## 2018-10-19 DIAGNOSIS — I255 Ischemic cardiomyopathy: Secondary | ICD-10-CM | POA: Diagnosis not present

## 2018-10-19 DIAGNOSIS — I2510 Atherosclerotic heart disease of native coronary artery without angina pectoris: Secondary | ICD-10-CM

## 2018-10-19 DIAGNOSIS — Z23 Encounter for immunization: Secondary | ICD-10-CM | POA: Insufficient documentation

## 2018-10-19 DIAGNOSIS — J189 Pneumonia, unspecified organism: Secondary | ICD-10-CM | POA: Diagnosis not present

## 2018-10-19 DIAGNOSIS — I493 Ventricular premature depolarization: Secondary | ICD-10-CM

## 2018-10-19 LAB — INFLUENZA PANEL BY PCR (TYPE A & B)
Influenza A By PCR: POSITIVE — AB
Influenza B By PCR: NEGATIVE

## 2018-10-19 MED ORDER — ALPRAZOLAM 0.25 MG PO TABS: 0.2500 mg | ORAL_TABLET | Freq: Two times a day (BID) | ORAL | 0 refills | Status: DC | PRN

## 2018-10-19 NOTE — Progress Notes (Signed)
Provider:  Veleta Miners, MD Location:  [place] Room Number: 132 P Place of Service:  SNF [phone])  PCP: Susy Frizzle, MD Patient Care Team: Susy Frizzle, MD as PCP - General [facility])  Extended Emergency Contact Information Primary Emergency Contact: Thurow,Carol Address: [address] [place] [address] Johnnette Litter of [place] Phone: [phone] Mobile Phone: [phone] Relation: Spouse Secondary Emergency Contact: [place] of [place] Mobile Phone: [phone] Relation: Daughter  Code Status: Full Code Goals of Care: Advanced Directive information Advanced Directives 10/19/2018  Does Patient Have a Medical Advance Directive? No  Type of Advance Directive -  Does patient want to make changes to medical advance directive? No - Patient declined  Copy of [place] in Chart? -  Would patient like information on creating a medical advance directive? No - Patient declined  Pre-existing out of facility DNR order (yellow form or pink MOST form) -      Chief Complaint  Patient presents with  . New Admit To SNF    Admission    HPI: Patient is a 84 y.o. male seen today for admission to SNF for therapy. Patient was in the hospital from   02/20-02/25 for COPD exacerbation and CAP   He has PMH of COPD on Chronic Oxygen 4-6 l as needed with exertion, GERD, Hypertension, CAD with Ischemic Cardiomyopathy, CKD stage 3, Diastolic CHF Last Echo Showed EF of 55-60%, PVC related to Cardiomyopathy Patient was send to the ED from her PCP office for worsening SOB, Cough productive and Low grade temp. He was diagnosed with CAP and was  Treated with Azithromycin and Rocephin. He eventually was tapered from BiPAP and is discharged to SNF for therapy on his Home O2 Patient says he is back at his baseline.He continues to have  Cough with Clear Sputum. He feels weak in his Legs and is unable to  stand. He lives with his wife. Has Camper and travels. Independent in his ADLS  Past Medical History:  Diagnosis Date  . Allergic rhinitis   . Anxiety   . Bronchiectasis   . CAD (coronary artery disease)   . Celiac disease   . Chronic heart failure ([place])   . Chronic kidney disease (CKD), stage III (moderate) (HCC)   . COPD (chronic obstructive pulmonary disease) ([place])   . Diverticulosis of colon (without mention of hemorrhage)   . Esophageal reflux   . Family history of adverse reaction to anesthesia    daughter gets PONV  . Family hx of colon cancer   . History of stomach ulcers 1980s  . Hyperlipemia   . Hypertension   . 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 Myocardial infarction [facility])    "previous MI/echo in 12/2015"  . Nephrolithiasis    "got them now; never had OR/scopes" (02/03/2016)  . On home oxygen therapy    "3L-5L; 24/7" (12/29/2017)  . Other diseases of lung, not elsewhere classified   . Personal history of colonic polyps 04/26/2007   hyperplastic   . Pneumonia "several times"   Past Surgical History:  Procedure Laterality Date  . CARDIAC CATHETERIZATION  02/03/2016  . CARDIAC CATHETERIZATION  09/30/2009   non-obstructive CAD w/30% narrowing in prox LAD (Dr. Waunita Schooner)  . CARDIAC CATHETERIZATION  05/04/2001   same at 2011 cath (Dr. Waunita Schooner)  . CARDIAC CATHETERIZATION N/A 02/03/2016  Procedure: Right/Left Heart Cath and Coronary Angiography;  Surgeon: Pixie Casino, MD;  Location: [place] CV LAB;  Service: Cardiovascular;  Laterality: N/A;  . EPIGLOTOPLASTY W/ MLB     removal due to carcinoma  . EXCISIONAL HEMORRHOIDECTOMY  2000s  . HAND SURGERY Right 1958   d/t crush injury  . TONSILLECTOMY AND ADENOIDECTOMY    . ULTRASOUND GUIDANCE FOR VASCULAR ACCESS  02/03/2016   Procedure: Ultrasound Guidance For Vascular Access;  Surgeon: Pixie Casino, MD;  Location: [place] [facility] CV LAB;  Service: Cardiovascular;;  . VASECTOMY       reports that he quit smoking about 29 years ago. His smoking use included cigarettes. He has a 70.00 pack-year smoking history. He quit smokeless tobacco use about 17 years ago.  His smokeless tobacco use included chew. He reports that he does not drink alcohol or use drugs. Social History   Socioeconomic History  . Marital status: Married    Spouse name: Not on file  . Number of children: 3  . Years of education: Not on file  . Highest education level: Not on file  Occupational History  . 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 Financial resource strain: Not on file  . Food insecurity:    Worry: Not on file    Inability: Not on file  . Transportation needs:    Medical: Not on file    Non-medical: Not on file  Tobacco Use  . Smoking status: Former Smoker    Packs/day: 2.00    Years: 35.00    Pack years: 70.00    Types: Cigarettes    Last attempt to quit: 01/21/1989    Years since quitting: 29.7  . Smokeless tobacco: Former Systems developer    Types: [place] date: 08/23/2001  Substance and Sexual Activity  . Alcohol use: No  . Drug use: No  . Sexual activity: Not on file  Lifestyle  . Physical activity:    Days per week: Not on file    Minutes per session: Not on file  . Stress: Not on file  Relationships  . Social connections:    Talks on phone: Not on file    Gets together: Not on file    Attends religious service: Not on file    Active member of club or organization: Not on file    Attends meetings of clubs or organizations: Not on file    Relationship status: Not on file  . Intimate partner violence:    Fear of current or ex partner: Not on file    Emotionally abused: Not on file    Physically abused: Not on file    Forced sexual activity: Not on file  Other Topics Concern  . Not on file  Social History Narrative  . Not on file    Functional Status Survey:    Family History  Problem Relation Age of Onset  .  Heart disease Father   . Colon cancer Father   . Prostate cancer Father   . Stroke Mother   . Endometrial cancer Sister   . Stroke Maternal Grandmother   . Cancer Paternal Grandmother     Health Maintenance  Topic Date Due  . Samul Dada  04/17/2022  . INFLUENZA VACCINE  Completed  . PNA vac Low Risk Adult  Completed    Allergies  Allergen Reactions  . Codeine Other (See Comments)  Thought head was going to blow off/ severe headache  . Gluten Meal Other (See Comments)    Celiac disease  . Adhesive [Tape] Other (See Comments)    Band-aids - tears skin off    Outpatient Encounter Medications as of 10/19/2018  Medication Sig  . acetaminophen (TYLENOL) 325 MG tablet Take 2 tablets (650 mg total) by mouth every 6 (six) hours as needed for fever.  Marland Kitchen albuterol (PROVENTIL HFA;VENTOLIN HFA) 108 (90 Base) MCG/ACT inhaler Inhale 2 puffs into the lungs every 4 (four) hours as needed for wheezing or shortness of breath.  Marland Kitchen albuterol (PROVENTIL) (2.5 MG/3ML) 0.083% nebulizer solution Take 2.5 mg by nebulization every 4 (four) hours as needed for wheezing or shortness of breath.  . ALPRAZolam (XANAX) 0.25 MG tablet Take 1 tablet (0.25 mg total) by mouth 2 (two) times daily as needed for anxiety.  Marland Kitchen amiodarone (PACERONE) 200 MG tablet Take 1/2 tablet (100 mg) by mouth daily  . aspirin EC 81 MG tablet Take 81 mg by mouth daily.  Marland Kitchen atorvastatin (LIPITOR) 20 MG tablet Take 1 tablet (20 mg total) by mouth daily.  . Azelastine HCl 137 MCG/SPRAY SOLN Place 2 sprays into both nostrils 2 (two) times daily.  . budesonide-formoterol (SYMBICORT) 160-4.5 MCG/ACT inhaler Inhale 2 puffs into the lungs every 12 (twelve) hours.  Derrill Memo ON 10/20/2018] calcitonin, salmon, (MIACALCIN/FORTICAL) 200 UNIT/ACT nasal spray Place 1 spray into alternate nostrils. Every other day  . Calcium Carbonate-Vitamin D (CALTRATE 600+D) 600-400 MG-UNIT tablet Take 1 tablet by mouth daily.   Marland Kitchen dextromethorphan-guaiFENesin  (MUCINEX DM) 30-600 MG 12hr tablet Take 1 tablet by mouth 2 (two) times daily.  Marland Kitchen HYDROcodone-homatropine (HYCODAN) 5-1.5 MG/5ML syrup Take 5 mLs by mouth every 6 (six) hours as needed for cough.  . LUTEIN PO Take 1 capsule by mouth daily.  . montelukast (SINGULAIR) 10 MG tablet Take 10 mg by mouth at bedtime.  . Multiple Vitamins-Minerals (CENTRUM SILVER 50+MEN) TABS Take 1 tablet by mouth daily.  . NON FORMULARY Diet Type:  NAS  . OXYGEN Inhale 4 L/min into the lungs continuous.  . pantoprazole (PROTONIX) 40 MG tablet Take 40 mg by mouth daily.  . polyethylene glycol (MIRALAX / GLYCOLAX) packet Take 17 g by mouth daily as needed for mild constipation. Mix in 8 oz liquid and drink  . predniSONE (STERAPRED UNI-PAK 21 TAB) 10 MG (21) TBPK tablet Take 10 mg by mouth as directed. 10/19/18 = 6 tablets 10/20/18 = 5 tablets 10/21/18 = 4 tablets 10/22/18 =  3 tablets 10/23/18 - 2 tablets 10/24/18 - 1 tablet  . sacubitril-valsartan (ENTRESTO) 49-51 MG Take 1 tablet by mouth 2 (two) times daily.  Marland Kitchen spironolactone (ALDACTONE) 25 MG tablet Take 12.5 mg by mouth daily.  . tamsulosin (FLOMAX) 0.4 MG CAPS capsule Take 0.4 mg by mouth daily. Give 30 minutes after same meal each day  . Tiotropium Bromide Monohydrate (SPIRIVA RESPIMAT) 2.5 MCG/ACT AERS Inhale 2 puffs into the lungs 2 (two) times daily.  . [DISCONTINUED] albuterol (PROAIR HFA) 108 (90 Base) MCG/ACT inhaler INHALE 2 PUFFS EVERY 4 HOURS AS NEEDED FOR SHORTNESS OF BREATH. (Patient not taking: Reported on 10/19/2018)  . [DISCONTINUED] albuterol (PROVENTIL) (2.5 MG/3ML) 0.083% nebulizer solution Take 3 mLs (2.5 mg total) by nebulization every 4 (four) hours as needed for wheezing or shortness of breath. (Patient not taking: Reported on 10/19/2018)  . [DISCONTINUED] amiodarone (PACERONE) 200 MG tablet TAKE 1/2 TABLET BY MOUTH ONCE DAILY. (Patient not taking: No sig reported)  . [  DISCONTINUED] azelastine (ASTELIN) 0.1 % nasal spray PLACE 2 SPRAYS INTO EACH  NOSTRILS TWICE DAILY AS DIRECTED. (Patient not taking: Reported on 10/19/2018)  . [DISCONTINUED] budesonide-formoterol (SYMBICORT) 160-4.5 MCG/ACT inhaler INHALE 2 PUFFS FIRST THING IN MORNING AND THEN ANOTHER 2 PUFFS ABOUT 12 HOURS LATER. (Patient not taking: Reported on 10/19/2018)  . [DISCONTINUED] calcitonin, salmon, (MIACALCIN) 200 UNIT/ACT nasal spray Place 1 spray into alternate nostrils daily. (Patient not taking: Reported on 10/19/2018)  . [DISCONTINUED] ENTRESTO 49-51 MG TAKE (1) TABLET BY MOUTH TWICE DAILY. (Patient not taking: No sig reported)  . [DISCONTINUED] montelukast (SINGULAIR) 10 MG tablet TAKE 1 TABLET BY MOUTH DAILY. (Patient not taking: No sig reported)  . [DISCONTINUED] OXYGEN Inhale 3 L into the lungs See admin instructions. 3lpm with rest and 6 lpm with exertion continuous  . [DISCONTINUED] pantoprazole (PROTONIX) 40 MG tablet TAKE ONE TABLET BY MOUTH DAILY. (Patient not taking: No sig reported)  . [DISCONTINUED] SPIRIVA RESPIMAT 2.5 MCG/ACT AERS INHALE 2 PUFFS INTO THE LUNGS ONCE DAILY. (Patient not taking: No sig reported)  . [DISCONTINUED] spironolactone (ALDACTONE) 25 MG tablet TAKE (1/2) TABLET BY MOUTH DAILY. (Patient not taking: No sig reported)  . [DISCONTINUED] tamsulosin (FLOMAX) 0.4 MG CAPS capsule TAKE ONE CAPSULE BY MOUTH ONCE DAILY. (Patient not taking: No sig reported)   No facility-administered encounter medications on file as of 10/19/2018.     Review of Systems  Constitutional: Positive for activity change.  HENT: Negative.   Respiratory: Positive for cough and shortness of breath.   Cardiovascular: Negative.   Gastrointestinal: Negative.   Genitourinary: Negative.   Musculoskeletal: Negative.   Skin: Negative.   Neurological: Positive for weakness.  Psychiatric/Behavioral: Negative.     Vitals:   10/19/18 0910  BP: (!) 149/76  Pulse: 84  Resp: 20  Temp: (!) 97.2 F (36.2 C)  SpO2: 92%  Weight: 128 lb (58.1 kg)  Height: 5' 6"  (1.676 m)    Body mass index is 20.66 kg/m. Physical Exam HENT:     Head: Normocephalic.     Nose: Nose normal.     Mouth/Throat:     Mouth: Mucous membranes are moist.     Comments: Thrush Eyes:     Pupils: Pupils are equal, round, and reactive to light.  Neck:     Musculoskeletal: Neck supple.  Cardiovascular:     Rate and Rhythm: Normal rate and regular rhythm.     Pulses: Normal pulses.     Heart sounds: Normal heart sounds.  Pulmonary:     Effort: Pulmonary effort is normal.     Breath sounds: Normal breath sounds.     Comments: Decreased breadth Sound bilateral No Wheezing or rales Abdominal:     General: Abdomen is flat. Bowel sounds are normal.     Palpations: Abdomen is soft.  Musculoskeletal:        General: No swelling.  Skin:    General: Skin is warm and dry.  Neurological:     General: No focal deficit present.     Mental Status: He is oriented to person, place, and time.  Psychiatric:        Mood and Affect: Mood normal.        Thought Content: Thought content normal.        Judgment: Judgment normal.     Labs reviewed: Basic Metabolic Panel: Recent Labs    10/12/18 1047 10/12/18 1132 10/13/18 0322 10/14/18 0212  NA 135 134* 135 137  K 4.2 3.9 4.1 4.2  CL 100  --  106 107  CO2 23  --  20* 19*  GLUCOSE 105*  --  127* 142*  BUN 26*  --  24* 30*  CREATININE 1.33*  --  1.03 1.15  CALCIUM 8.9  --  8.4* 8.7*   Liver Function Tests: Recent Labs    12/29/17 1333  04/25/18 0832 09/11/18 1243 10/12/18 1047  AST 19   < > 12 22 32  ALT 21   < > 18 26 34  ALKPHOS 59  --   --  62 51  BILITOT 0.7   < > 0.4 0.7 1.3*  PROT 6.0*   < > 5.6* 5.4* 5.8*  ALBUMIN 3.4*  --   --  3.5 3.4*   < > = values in this interval not displayed.   No results for input(s): LIPASE, AMYLASE in the last 8760 hours. No results for input(s): AMMONIA in the last 8760 hours. CBC: Recent Labs    01/05/18 1234 04/25/18 0832 10/12/18 1047  10/14/18 0212 10/16/18 1003  10/17/18 0222  WBC 14.1* 7.6 15.0*   < > 11.6* 8.5 7.4  NEUTROABS 12,549* 4,697 12.1*  --   --   --   --   HGB 15.1 16.0 15.4   < > 12.4* 12.9* 11.3*  HCT 43.8 46.8 49.6   < > 37.6* 40.3 35.6*  MCV 97.1 98.9 104.2*   < > 101.3* 101.8* 101.4*  PLT 217 186 123*   < > 94* 94* 82*   < > = values in this interval not displayed.   Cardiac Enzymes: No results for input(s): CKTOTAL, CKMB, CKMBINDEX, TROPONINI in the last 8760 hours. BNP: Invalid input(s): POCBNP Lab Results  Component Value Date   HGBA1C 5.6 02/05/2016   Lab Results  Component Value Date   TSH 4.323 12/07/2017   Lab Results  Component Value Date   VITAMINB12 808 02/24/2017   Lab Results  Component Value Date   FOLATE 17.0 02/24/2017   Lab Results  Component Value Date   IRON 85 02/24/2017   TIBC 174 (L) 02/24/2017   FERRITIN 252 02/24/2017    Imaging and Procedures obtained prior to SNF admission: No results found.  Assessment/Plan  COPD exacerbation (HCC) On Prednisone Taper He says he is on Chronic 5 mg dose of Prednisone at home Also on Budesonide and Spiriva On 5-6 l of Oxygen   Community acquired pneumonia,  Finished antibiotics at home  Coronary artery disease  With Cardiomyopathy, ischemic On Lasix Also On Entresto and ASpirin  PVC's (premature ventricular contractions) On Low dose of Amiodarone Oral Thrush Will start on Nystatin BPH Stable on Flomax Anxiety Continue on Home dose of Ativan Anemia with Macrocytosis Recheck Hgb Vit B12 was normal in 2018  Addendum We also Tested Patient for Flu Did Nasal Swab and he is positive for Influenza A Positive Will Put him in Isolation and start on Tamiflu  Family/ staff Communication:   Labs/tests ordered: CBC and BMP in 1 week Total time spent in this patient care encounter was 45_ minutes; greater than 50% of the visit spent counseling patient, reviewing records , Labs and coordinating care for problems addressed at this encounter.

## 2018-10-20 ENCOUNTER — Non-Acute Institutional Stay (SKILLED_NURSING_FACILITY): Payer: Medicare Other | Admitting: Adult Health

## 2018-10-20 ENCOUNTER — Encounter: Payer: Self-pay | Admitting: Adult Health

## 2018-10-20 DIAGNOSIS — J9611 Chronic respiratory failure with hypoxia: Secondary | ICD-10-CM

## 2018-10-20 DIAGNOSIS — J449 Chronic obstructive pulmonary disease, unspecified: Secondary | ICD-10-CM

## 2018-10-20 DIAGNOSIS — J101 Influenza due to other identified influenza virus with other respiratory manifestations: Secondary | ICD-10-CM | POA: Diagnosis not present

## 2018-10-20 NOTE — Progress Notes (Addendum)
Location:   [facility] Room Number: 132 P Place of Service:  SNF (31)   CODE STATUS: Full Code  Allergies  Allergen Reactions  . Codeine Other (See Comments)    Thought head was going to blow off/ severe headache  . Gluten Meal Other (See Comments)    Celiac disease  . Adhesive [Tape] Other (See Comments)    Band-aids - tears skin off    Chief Complaint  Patient presents with  . Acute Visit    Flu A Positive    HPI:  He had been hospitalized for COPD and pneumonia. He was treated in the hospitali. He is here for short term rehab. He had a nasal swab for influenza due to his continued cough; shortness of breath; sputum and lower 02 sat. It has returned positive for A. He is on tamiflu.   Past Medical History:  Diagnosis Date  . Allergic rhinitis   . Anxiety   . Bronchiectasis   . CAD (coronary artery disease)   . Celiac disease   . Chronic heart failure ([place])   . Chronic kidney disease (CKD), stage III (moderate) (HCC)   . COPD (chronic obstructive pulmonary disease) ([place])   . Diverticulosis of colon (without mention of hemorrhage)   . Esophageal reflux   . Family history of adverse reaction to anesthesia    daughter gets PONV  . Family hx of colon cancer   . History of stomach ulcers 1980s  . Hyperlipemia   . Hypertension   . 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 Myocardial infarction [facility])    "previous MI/echo in 12/2015"  . Nephrolithiasis    "got them now; never had OR/scopes" (02/03/2016)  . On home oxygen therapy    "3L-5L; 24/7" (12/29/2017)  . Other diseases of lung, not elsewhere classified   . Personal history of colonic polyps 04/26/2007   hyperplastic   . Pneumonia "several times"    Past Surgical History:  Procedure Laterality Date  . CARDIAC CATHETERIZATION  02/03/2016  . CARDIAC CATHETERIZATION  09/30/2009   non-obstructive CAD w/30% narrowing in prox LAD (Dr. Waunita Schooner)  . CARDIAC  CATHETERIZATION  05/04/2001   same at 2011 cath (Dr. Waunita Schooner)  . CARDIAC CATHETERIZATION N/A 02/03/2016   Procedure: Right/Left Heart Cath and Coronary Angiography;  Surgeon: Pixie Casino, MD;  Location: [place] CV LAB;  Service: Cardiovascular;  Laterality: N/A;  . EPIGLOTOPLASTY W/ MLB     removal due to carcinoma  . EXCISIONAL HEMORRHOIDECTOMY  2000s  . HAND SURGERY Right 1958   d/t crush injury  . TONSILLECTOMY AND ADENOIDECTOMY    . ULTRASOUND GUIDANCE FOR VASCULAR ACCESS  02/03/2016   Procedure: Ultrasound Guidance For Vascular Access;  Surgeon: Pixie Casino, MD;  Location: [place] CV LAB;  Service: Cardiovascular;;  . VASECTOMY      Social History   Socioeconomic History  . Marital status: Married    Spouse name: Not on file  . Number of children: 3  . Years of education: Not on file  . Highest education level: Not on file  Occupational History  . 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 Financial resource strain: Not on file  . Food insecurity:    Worry: Not on file    Inability: Not on file  . Transportation needs:    Medical: Not on  file    Non-medical: Not on file  Tobacco Use  . Smoking status: Former Smoker    Packs/day: 2.00    Years: 35.00    Pack years: 70.00    Types: Cigarettes    Last attempt to quit: 01/21/1989    Years since quitting: 29.7  . Smokeless tobacco: Former Systems developer    Types: [place] date: 08/23/2001  Substance and Sexual Activity  . Alcohol use: No  . Drug use: No  . Sexual activity: Not on file  Lifestyle  . Physical activity:    Days per week: Not on file    Minutes per session: Not on file  . Stress: Not on file  Relationships  . Social connections:    Talks on phone: Not on file    Gets together: Not on file    Attends religious service: Not on file    Active member of club or organization: Not on file    Attends meetings of clubs or organizations: Not on  file    Relationship status: Not on file  . Intimate partner violence:    Fear of current or ex partner: Not on file    Emotionally abused: Not on file    Physically abused: Not on file    Forced sexual activity: Not on file  Other Topics Concern  . Not on file  Social History Narrative  . Not on file   Family History  Problem Relation Age of Onset  . Heart disease Father   . Colon cancer Father   . Prostate cancer Father   . Stroke Mother   . Endometrial cancer Sister   . Stroke Maternal Grandmother   . Cancer Paternal Grandmother       VITAL SIGNS BP 126/66   Pulse 74   Temp (!) 96.8 F (36 C)   Resp 20   Ht 5' 6"  (1.676 m)   Wt 127 lb 9.6 oz (57.9 kg)   SpO2 92%   BMI 20.60 kg/m   Outpatient Encounter Medications as of 10/20/2018  Medication Sig  . acetaminophen (TYLENOL) 325 MG tablet Take 2 tablets (650 mg total) by mouth every 6 (six) hours as needed for fever.  Marland Kitchen albuterol (PROVENTIL HFA;VENTOLIN HFA) 108 (90 Base) MCG/ACT inhaler Inhale 2 puffs into the lungs every 4 (four) hours as needed for wheezing or shortness of breath.  Marland Kitchen albuterol (PROVENTIL) (2.5 MG/3ML) 0.083% nebulizer solution Take 2.5 mg by nebulization every 4 (four) hours as needed for wheezing or shortness of breath.  . ALPRAZolam (XANAX) 0.25 MG tablet Take 1 tablet (0.25 mg total) by mouth 2 (two) times daily as needed for up to 14 days for anxiety.  Marland Kitchen amiodarone (PACERONE) 200 MG tablet Take 1/2 tablet (100 mg) by mouth daily  . aspirin EC 81 MG tablet Take 81 mg by mouth daily.  Marland Kitchen atorvastatin (LIPITOR) 20 MG tablet Take 1 tablet (20 mg total) by mouth daily.  . Azelastine HCl 137 MCG/SPRAY SOLN Place 2 sprays into both nostrils 2 (two) times daily.  . budesonide-formoterol (SYMBICORT) 160-4.5 MCG/ACT inhaler Inhale 2 puffs into the lungs every 12 (twelve) hours.  . calcitonin, salmon, (MIACALCIN/FORTICAL) 200 UNIT/ACT nasal spray Place 1 spray into alternate nostrils. Every other day  .  Calcium Carbonate-Vitamin D (CALTRATE 600+D) 600-400 MG-UNIT tablet Take 1 tablet by mouth daily.   Marland Kitchen dextromethorphan-guaiFENesin (MUCINEX DM) 30-600 MG 12hr tablet Take 1 tablet by mouth 2 (two) times daily.  Marland Kitchen HYDROcodone-homatropine (  HYCODAN) 5-1.5 MG/5ML syrup Take 5 mLs by mouth every 6 (six) hours as needed for cough.  . LUTEIN PO Take 1 capsule by mouth daily.  . montelukast (SINGULAIR) 10 MG tablet Take 10 mg by mouth at bedtime.  . Multiple Vitamins-Minerals (CENTRUM SILVER 50+MEN) TABS Take 1 tablet by mouth daily.  . NON FORMULARY Diet Type:  Gluten Free  . nystatin (MYCOSTATIN) 100000 UNIT/ML suspension Take 5 mLs by mouth 4 (four) times daily. Swish and spit for 7 days  . oseltamivir (TAMIFLU) 75 MG capsule Take 75 mg by mouth 2 (two) times daily.  . OXYGEN Inhale 4 L/min into the lungs continuous.  . pantoprazole (PROTONIX) 40 MG tablet Take 40 mg by mouth daily.  . polyethylene glycol (MIRALAX / GLYCOLAX) packet Take 17 g by mouth daily as needed for mild constipation. Mix in 8 oz liquid and drink  . predniSONE (STERAPRED UNI-PAK 21 TAB) 10 MG (21) TBPK tablet Take 10 mg by mouth as directed. 10/19/18 = 6 tablets 10/20/18 = 5 tablets 10/21/18 = 4 tablets 10/22/18 =  3 tablets 10/23/18 - 2 tablets 10/24/18 - 1 tablet  . sacubitril-valsartan (ENTRESTO) 49-51 MG Take 1 tablet by mouth 2 (two) times daily.  Marland Kitchen spironolactone (ALDACTONE) 25 MG tablet Take 12.5 mg by mouth daily.  . tamsulosin (FLOMAX) 0.4 MG CAPS capsule Take 0.4 mg by mouth daily. Give 30 minutes after same meal each day  . Tiotropium Bromide Monohydrate (SPIRIVA RESPIMAT) 2.5 MCG/ACT AERS Inhale 2 puffs into the lungs 2 (two) times daily.   No facility-administered encounter medications on file as of 10/20/2018.      SIGNIFICANT DIAGNOSTIC EXAMS  TODAY;   10-12-18: chest x-ray:  1. Bilateral lower lobe opacities compatible with atelectasis and or pneumonia. 2. Advanced changes of emphysema and aortic  atherosclerosis. Aortic Atherosclerosis and Emphysema    LABS REVIEWED TODAY;   10-12-18: wbc 15.0; hgb 15.4; hct 49.6; mcv 104.2 plt 123; glucose 105; bun 26; creat 1.33; k+ 4.2; na++ 135; ca 8.9 total bili 1.3; protein 5.8; albumin 3.4 blood culture: no growth  10-14-18: wbc 11.6; hgb 12.4; ht 37.6; mcv 101.3 ;plt 94; glucose 142; bun 30; create 1.15; k+ 4.2; na++ 137; ca 8.7 10-17-18: wbc 7.4; hgb 11.3; hct 35.6; mcv 101.4; plt 82;  10-19-18: flu swab: + A flu  Review of Systems  Constitutional: Negative for malaise/fatigue.  Respiratory: Positive for cough and shortness of breath.   Cardiovascular: Negative for chest pain, palpitations and leg swelling.  Gastrointestinal: Negative for abdominal pain, constipation and heartburn.  Musculoskeletal: Negative for back pain, joint pain and myalgias.  Skin: Negative.   Neurological: Negative for dizziness.  Psychiatric/Behavioral: The patient is not nervous/anxious.     Physical Exam Constitutional:      General: He is not in acute distress.    Appearance: He is well-developed. He is not diaphoretic.     Comments: thin  Neck:     Thyroid: No thyromegaly.  Cardiovascular:     Rate and Rhythm: Normal rate and regular rhythm.     Pulses: Normal pulses.     Heart sounds: Normal heart sounds.  Pulmonary:     Effort: Pulmonary effort is normal. No respiratory distress.     Comments: 02 dependent Breath sounds diminished bilaterally  Abdominal:     General: Bowel sounds are normal. There is no distension.     Palpations: Abdomen is soft.     Tenderness: There is no abdominal tenderness.  Musculoskeletal:  Right lower leg: No edema.     Left lower leg: No edema.     Comments: Is able to move all extremities   Lymphadenopathy:     Cervical: No cervical adenopathy.  Skin:    General: Skin is warm and dry.  Neurological:     Mental Status: He is alert. Mental status is at baseline.  Psychiatric:        Mood and Affect: Mood  normal.      ASSESSMENT/ PLAN:  TODAY:   1. Influenza A: is without change in status: will complete tamiflu and will monitor his status.   2. COPD GOLD II/ Chronic respiratory failure with hypoxia: is without change: is 02 dependent: will continue spiriva respimat 2 puffs twice daily; synbicort 160/4.5 mcg 2 puffs twice daily axelastine 2 puffs per nostril twice daily mucinex DM twice daily singulair 10 mg daily albuterol 2 puffs or neb treatment every 4 hours as needed. Will complete prednisone taper.     MD is aware of resident's narcotic use and is in agreement with current plan of care. We will attempt to wean resident as apropriate   Ok Edwards NP [facility]  Contact [phone] Monday through Friday 8am- 5pm  After hours call [phone]

## 2018-10-23 ENCOUNTER — Encounter: Payer: Self-pay | Admitting: Adult Health

## 2018-10-23 ENCOUNTER — Non-Acute Institutional Stay (SKILLED_NURSING_FACILITY): Payer: Medicare Other | Admitting: Adult Health

## 2018-10-23 DIAGNOSIS — J449 Chronic obstructive pulmonary disease, unspecified: Secondary | ICD-10-CM | POA: Diagnosis not present

## 2018-10-23 DIAGNOSIS — J441 Chronic obstructive pulmonary disease with (acute) exacerbation: Secondary | ICD-10-CM

## 2018-10-23 NOTE — Progress Notes (Signed)
Location:   [place] Room Number: 132 P Place of Service:  SNF (31)   CODE STATUS: Full Code  Allergies  Allergen Reactions  . Codeine Other (See Comments)    Thought head was going to blow off/ severe headache  . Gluten Meal Other (See Comments)    Celiac disease  . Adhesive [Tape] Other (See Comments)    Band-aids - tears skin off    Chief Complaint  Patient presents with  . Acute Visit    Decreased Oxygen levels    HPI:  He is presently requiring 02 at 5 L to maintain his 02 sat 90-92%. There are no reports of fevers; no reports of worsening cough. He is short of breath; he is presently being treated for flu. There are no reports of changes in appetite. He continues to participate in therapy.   Past Medical History:  Diagnosis Date  . Allergic rhinitis   . Anxiety   . Bronchiectasis   . CAD (coronary artery disease)   . Celiac disease   . Chronic heart failure ([employer])   . Chronic kidney disease (CKD), stage III (moderate) (HCC)   . COPD (chronic obstructive pulmonary disease) ([place])   . Diverticulosis of colon (without mention of hemorrhage)   . Esophageal reflux   . Family history of adverse reaction to anesthesia    daughter gets PONV  . Family hx of colon cancer   . History of stomach ulcers 1980s  . Hyperlipemia   . Hypertension   . 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 Myocardial infarction [facility])    "previous MI/echo in 12/2015"  . Nephrolithiasis    "got them now; never had OR/scopes" (02/03/2016)  . On home oxygen therapy    "3L-5L; 24/7" (12/29/2017)  . Other diseases of lung, not elsewhere classified   . Personal history of colonic polyps 04/26/2007   hyperplastic   . Pneumonia "several times"    Past Surgical History:  Procedure Laterality Date  . CARDIAC CATHETERIZATION  02/03/2016  . CARDIAC CATHETERIZATION  09/30/2009   non-obstructive CAD w/30% narrowing in prox LAD (Dr. Waunita Schooner)  .  CARDIAC CATHETERIZATION  05/04/2001   same at 2011 cath (Dr. Waunita Schooner)  . CARDIAC CATHETERIZATION N/A 02/03/2016   Procedure: Right/Left Heart Cath and Coronary Angiography;  Surgeon: Pixie Casino, MD;  Location: [place] [facility];  Service: Cardiovascular;  Laterality: N/A;  . EPIGLOTOPLASTY W/ MLB     removal due to carcinoma  . EXCISIONAL HEMORRHOIDECTOMY  2000s  . HAND SURGERY Right 1958   d/t crush injury  . TONSILLECTOMY AND ADENOIDECTOMY    . ULTRASOUND GUIDANCE FOR VASCULAR ACCESS  02/03/2016   Procedure: Ultrasound Guidance For Vascular Access;  Surgeon: Pixie Casino, MD;  Location: [place] [facility];  Service: Cardiovascular;;  . VASECTOMY      Social History   Socioeconomic History  . Marital status: Married    Spouse name: Not on file  . Number of children: 3  . Years of education: Not on file  . Highest education level: Not on file  Occupational History  . 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 Financial resource strain: Not on file  . Food insecurity:    Worry: Not on file    Inability: Not on file  . Transportation needs:    Medical: Not on  file    Non-medical: Not on file  Tobacco Use  . Smoking status: Former Smoker    Packs/day: 2.00    Years: 35.00    Pack years: 70.00    Types: Cigarettes    Last attempt to quit: 01/21/1989    Years since quitting: 29.7  . Smokeless tobacco: Former Systems developer    Types: [place] date: 08/23/2001  Substance and Sexual Activity  . Alcohol use: No  . Drug use: No  . Sexual activity: Not on file  Lifestyle  . Physical activity:    Days per week: Not on file    Minutes per session: Not on file  . Stress: Not on file  Relationships  . Social connections:    Talks on phone: Not on file    Gets together: Not on file    Attends religious service: Not on file    Active member of club or organization: Not on file    Attends meetings of clubs or organizations:  Not on file    Relationship status: Not on file  . Intimate partner violence:    Fear of current or ex partner: Not on file    Emotionally abused: Not on file    Physically abused: Not on file    Forced sexual activity: Not on file  Other Topics Concern  . Not on file  Social History Narrative  . Not on file   Family History  Problem Relation Age of Onset  . Heart disease Father   . Colon cancer Father   . Prostate cancer Father   . Stroke Mother   . Endometrial cancer Sister   . Stroke Maternal Grandmother   . Cancer Paternal Grandmother       VITAL SIGNS BP 127/77   Pulse (!) 51   Temp (!) 97.4 F (36.3 C)   Resp 20   Ht 5' 6"  (1.676 m)   Wt 123 lb 3.2 oz (55.9 kg)   SpO2 92%   BMI 19.89 kg/m   Outpatient Encounter Medications as of 10/23/2018  Medication Sig  . acetaminophen (TYLENOL) 325 MG tablet Take 2 tablets (650 mg total) by mouth every 6 (six) hours as needed for fever.  Marland Kitchen albuterol (PROVENTIL HFA;VENTOLIN HFA) 108 (90 Base) MCG/ACT inhaler Inhale 2 puffs into the lungs every 4 (four) hours as needed for wheezing or shortness of breath.  Marland Kitchen albuterol (PROVENTIL) (2.5 MG/3ML) 0.083% nebulizer solution Take 2.5 mg by nebulization every 4 (four) hours as needed for wheezing or shortness of breath.  . ALPRAZolam (XANAX) 0.25 MG tablet Take 1 tablet (0.25 mg total) by mouth 2 (two) times daily as needed for up to 14 days for anxiety.  Marland Kitchen amiodarone (PACERONE) 200 MG tablet Take 1/2 tablet (100 mg) by mouth daily  . aspirin EC 81 MG tablet Take 81 mg by mouth daily.  Marland Kitchen atorvastatin (LIPITOR) 20 MG tablet Take 1 tablet (20 mg total) by mouth daily.  . Azelastine HCl 137 MCG/SPRAY SOLN Place 2 sprays into both nostrils 2 (two) times daily.  . budesonide-formoterol (SYMBICORT) 160-4.5 MCG/ACT inhaler Inhale 2 puffs into the lungs every 12 (twelve) hours.  . calcitonin, salmon, (MIACALCIN/FORTICAL) 200 UNIT/ACT nasal spray Place 1 spray into alternate nostrils. Every other  day  . Calcium Carbonate-Vitamin D (CALTRATE 600+D) 600-400 MG-UNIT tablet Take 1 tablet by mouth daily.   Marland Kitchen dextromethorphan-guaiFENesin (MUCINEX DM) 30-600 MG 12hr tablet Take 1 tablet by mouth 2 (two) times daily.  Marland Kitchen  HYDROcodone-homatropine (HYCODAN) 5-1.5 MG/5ML syrup Take 5 mLs by mouth every 6 (six) hours as needed for cough.  . LUTEIN PO Take 1 capsule by mouth daily.  . montelukast (SINGULAIR) 10 MG tablet Take 10 mg by mouth at bedtime.  . Multiple Vitamins-Minerals (CENTRUM SILVER 50+MEN) TABS Take 1 tablet by mouth daily.  . NON FORMULARY Diet Type:  Gluten Free  . nystatin (MYCOSTATIN) 100000 UNIT/ML suspension Take 5 mLs by mouth 4 (four) times daily. Swish and spit for 7 days  . oseltamivir (TAMIFLU) 75 MG capsule Take 75 mg by mouth 2 (two) times daily.  . OXYGEN Inhale 5 L/min into the lungs continuous.   . pantoprazole (PROTONIX) 40 MG tablet Take 40 mg by mouth daily.  . polyethylene glycol (MIRALAX / GLYCOLAX) packet Take 17 g by mouth daily as needed for mild constipation. Mix in 8 oz liquid and drink  . predniSONE (STERAPRED UNI-PAK 21 TAB) 10 MG (21) TBPK tablet Take 10 mg by mouth as directed. 10/19/18 = 6 tablets 10/20/18 = 5 tablets 10/21/18 = 4 tablets 10/22/18 =  3 tablets 10/23/18 - 2 tablets 10/24/18 - 1 tablet  . sacubitril-valsartan (ENTRESTO) 49-51 MG Take 1 tablet by mouth 2 (two) times daily.  Marland Kitchen spironolactone (ALDACTONE) 25 MG tablet Take 12.5 mg by mouth daily.  . tamsulosin (FLOMAX) 0.4 MG CAPS capsule Take 0.4 mg by mouth daily. Give 30 minutes after same meal each day  . Tiotropium Bromide Monohydrate (SPIRIVA RESPIMAT) 2.5 MCG/ACT AERS Inhale 2 puffs into the lungs 2 (two) times daily.  Marland Kitchen UNABLE TO FIND Apply Xerofoam gauze to Right forearm and cover with a dry dressing.  Check daily and change every 3 days and as needed   No facility-administered encounter medications on file as of 10/23/2018.      SIGNIFICANT DIAGNOSTIC EXAMS  PREVIOUS;   10-12-18: chest  x-ray:  1. Bilateral lower lobe opacities compatible with atelectasis and or pneumonia. 2. Advanced changes of emphysema and aortic atherosclerosis. Aortic Atherosclerosis and Emphysema   NO NEW EXAMS    LABS REVIEWED PREVIOUS;   10-12-18: wbc 15.0; hgb 15.4; hct 49.6; mcv 104.2 plt 123; glucose 105; bun 26; creat 1.33; k+ 4.2; na++ 135; ca 8.9 total bili 1.3; protein 5.8; albumin 3.4 blood culture: no growth  10-14-18: wbc 11.6; hgb 12.4; ht 37.6; mcv 101.3 ;plt 94; glucose 142; bun 30; create 1.15; k+ 4.2; na++ 137; ca 8.7 10-17-18: wbc 7.4; hgb 11.3; hct 35.6; mcv 101.4; plt 82;  10-19-18: flu swab: + A flu  NO NEW LABS.   Review of Systems  Constitutional: Negative for malaise/fatigue.  Respiratory: Positive for cough and shortness of breath.        Is chronic is on long term 02   Cardiovascular: Negative for chest pain, palpitations and leg swelling.  Gastrointestinal: Negative for abdominal pain, constipation and heartburn.  Musculoskeletal: Negative for back pain, joint pain and myalgias.  Skin: Negative.   Neurological: Negative for dizziness.  Psychiatric/Behavioral: The patient is not nervous/anxious.     Physical Exam Constitutional:      General: He is not in acute distress.    Appearance: He is well-developed. He is not diaphoretic.     Comments: thin  Neck:     Musculoskeletal: Neck supple.     Thyroid: No thyromegaly.  Cardiovascular:     Rate and Rhythm: Normal rate and regular rhythm.     Pulses: Normal pulses.     Heart sounds: Normal heart sounds.  Pulmonary:     Effort: Pulmonary effort is normal. No respiratory distress.     Breath sounds: Normal breath sounds.     Comments:  02 dependent Breath sounds diminished bilaterally  Abdominal:     General: Bowel sounds are normal. There is no distension.     Palpations: Abdomen is soft.     Tenderness: There is no abdominal tenderness.  Musculoskeletal:     Right lower leg: No edema.     Left lower leg:  No edema.     Comments: Is able to move all extremities   Lymphadenopathy:     Cervical: No cervical adenopathy.  Skin:    General: Skin is warm and dry.  Neurological:     Mental Status: He is alert. Mental status is at baseline.  Psychiatric:        Mood and Affect: Mood normal.     ASSESSMENT/ PLAN:  TODAY  1. COPD GOLDII/ chronic respiratory failure with hypoxia: is without change: 02 dependent will continue spriva respimat 2 puffs twice daily symbicort 160/4.5 mcg 2 puffs twice daily aselastine 2 puffs per nostril twice daily muscinex dm twice daily singulair 10 mg daily and albuterol neb or 2 puffs every 4 hours as needed;   Will complete tamiflu and prednisone taper will monitor his status.     MD is aware of resident's narcotic use and is in agreement with current plan of care. We will attempt to wean resident as apropriate   Ok Edwards NP [facility]  Contact [phone] Monday through Friday 8am- 5pm  After hours call [phone]

## 2018-10-24 ENCOUNTER — Ambulatory Visit (HOSPITAL_COMMUNITY): Payer: Medicare Other

## 2018-10-26 ENCOUNTER — Encounter (HOSPITAL_COMMUNITY)
Admission: RE | Admit: 2018-10-26 | Discharge: 2018-10-26 | Disposition: A | Discharge status: Home / Self Care | Payer: Medicare Other | Source: Skilled Nursing Facility | Attending: Internal Medicine | Admitting: Internal Medicine

## 2018-10-26 ENCOUNTER — Non-Acute Institutional Stay (SKILLED_NURSING_FACILITY): Payer: Medicare Other | Admitting: Adult Health

## 2018-10-26 ENCOUNTER — Encounter: Payer: Self-pay | Admitting: Adult Health

## 2018-10-26 ENCOUNTER — Ambulatory Visit (HOSPITAL_COMMUNITY): Payer: Medicare Other

## 2018-10-26 DIAGNOSIS — I493 Ventricular premature depolarization: Secondary | ICD-10-CM

## 2018-10-26 DIAGNOSIS — N183 Chronic kidney disease, stage 3 unspecified: Secondary | ICD-10-CM

## 2018-10-26 DIAGNOSIS — I5042 Chronic combined systolic (congestive) and diastolic (congestive) heart failure: Secondary | ICD-10-CM

## 2018-10-26 DIAGNOSIS — J449 Chronic obstructive pulmonary disease, unspecified: Secondary | ICD-10-CM

## 2018-10-26 DIAGNOSIS — J9601 Acute respiratory failure with hypoxia: Secondary | ICD-10-CM | POA: Insufficient documentation

## 2018-10-26 DIAGNOSIS — K219 Gastro-esophageal reflux disease without esophagitis: Secondary | ICD-10-CM

## 2018-10-26 DIAGNOSIS — J9611 Chronic respiratory failure with hypoxia: Secondary | ICD-10-CM | POA: Insufficient documentation

## 2018-10-26 DIAGNOSIS — E785 Hyperlipidemia, unspecified: Secondary | ICD-10-CM

## 2018-10-26 DIAGNOSIS — F419 Anxiety disorder, unspecified: Secondary | ICD-10-CM

## 2018-10-26 LAB — BASIC METABOLIC PANEL
Anion gap: 4 — ABNORMAL LOW (ref 5–15)
BUN: 34 mg/dL — AB (ref 8–23)
CO2: 26 mmol/L (ref 22–32)
Calcium: 8.2 mg/dL — ABNORMAL LOW (ref 8.9–10.3)
Chloride: 107 mmol/L (ref 98–111)
Creatinine, Ser: 0.78 mg/dL (ref 0.61–1.24)
GFR calc Af Amer: >60 mL/min (ref >60)
GFR calc non Af Amer: >60 mL/min (ref >60)
Glucose, Bld: 78 mg/dL (ref 70–99)
POTASSIUM: 4.3 mmol/L (ref 3.5–5.1)
Sodium: 137 mmol/L (ref 135–145)

## 2018-10-26 LAB — CBC WITH DIFFERENTIAL/PLATELET
Abs Immature Granulocytes: 0.44 10*3/uL — ABNORMAL HIGH (ref 0.00–0.07)
Basophils Absolute: 0 10*3/uL (ref 0.0–0.1)
Basophils Relative: 0 %
EOS PCT: 1 %
Eosinophils Absolute: 0.1 10*3/uL (ref 0.0–0.5)
HCT: 42.7 % (ref 39.0–52.0)
Hemoglobin: 13.6 g/dL (ref 13.0–17.0)
Immature Granulocytes: 4 %
Lymphocytes Relative: 10 %
Lymphs Abs: 1.2 10*3/uL (ref 0.7–4.0)
MCH: 32.9 pg (ref 26.0–34.0)
MCHC: 31.9 g/dL (ref 30.0–36.0)
MCV: 103.4 fL — AB (ref 80.0–100.0)
Monocytes Absolute: 1.1 10*3/uL — ABNORMAL HIGH (ref 0.1–1.0)
Monocytes Relative: 9 %
Neutro Abs: 9.1 10*3/uL — ABNORMAL HIGH (ref 1.7–7.7)
Neutrophils Relative %: 76 %
Platelets: 206 10*3/uL (ref 150–400)
RBC: 4.13 MIL/uL — ABNORMAL LOW (ref 4.22–5.81)
RDW: 14.6 % (ref 11.5–15.5)
WBC: 11.9 10*3/uL — ABNORMAL HIGH (ref 4.0–10.5)
nRBC: 0 % (ref 0.0–0.2)

## 2018-10-26 NOTE — Progress Notes (Signed)
Location:   [place] Room Number: 132 P Place of Service:  SNF (31)   CODE STATUS: Full Code  Allergies  Allergen Reactions  . Codeine Other (See Comments)    Thought head was going to blow off/ severe headache  . Gluten Meal Other (See Comments)    Celiac disease  . Adhesive [Tape] Other (See Comments)    Band-aids - tears skin off    Chief Complaint  Patient presents with  . Medical Management of Chronic Issues    Chronic combined systolic and diastolic heart failure; PVC's; COPD GOLD II. Weekly follow up for the first 30 days post hospitalization.     HPI:  He is a 84 year old short term rehab patient being seen for the management of her chronic illnesses: heart failure; copd; pvcs. He denies any cough or shortness of breath. No changes in appetite; no chest pain; no insomnia.   Past Medical History:  Diagnosis Date  . Allergic rhinitis   . Anxiety   . Bronchiectasis   . CAD (coronary artery disease)   . Celiac disease   . Chronic heart failure ([place])   . Chronic kidney disease (CKD), stage III (moderate) (HCC)   . COPD (chronic obstructive pulmonary disease) ([place])   . Diverticulosis of colon (without mention of hemorrhage)   . Esophageal reflux   . Family history of adverse reaction to anesthesia    daughter gets PONV  . Family hx of colon cancer   . History of stomach ulcers 1980s  . Hyperlipemia   . Hypertension   . 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 Myocardial infarction [facility])    "previous MI/echo in 12/2015"  . Nephrolithiasis    "got them now; never had OR/scopes" (02/03/2016)  . On home oxygen therapy    "3L-5L; 24/7" (12/29/2017)  . Other diseases of lung, not elsewhere classified   . Personal history of colonic polyps 04/26/2007   hyperplastic   . Pneumonia "several times"    Past Surgical History:  Procedure Laterality Date  . CARDIAC CATHETERIZATION  02/03/2016  . CARDIAC CATHETERIZATION   09/30/2009   non-obstructive CAD w/30% narrowing in prox LAD (Dr. Waunita Schooner)  . CARDIAC CATHETERIZATION  05/04/2001   same at 2011 cath (Dr. Waunita Schooner)  . CARDIAC CATHETERIZATION N/A 02/03/2016   Procedure: Right/Left Heart Cath and Coronary Angiography;  Surgeon: Pixie Casino, MD;  Location: [place] CV LAB;  Service: Cardiovascular;  Laterality: N/A;  . EPIGLOTOPLASTY W/ MLB     removal due to carcinoma  . EXCISIONAL HEMORRHOIDECTOMY  2000s  . HAND SURGERY Right 1958   d/t crush injury  . TONSILLECTOMY AND ADENOIDECTOMY    . ULTRASOUND GUIDANCE FOR VASCULAR ACCESS  02/03/2016   Procedure: Ultrasound Guidance For Vascular Access;  Surgeon: Pixie Casino, MD;  Location: [place] CV LAB;  Service: Cardiovascular;;  . VASECTOMY      Social History   Socioeconomic History  . Marital status: Married    Spouse name: Not on file  . Number of children: 3  . Years of education: Not on file  . Highest education level: Not on file  Occupational History  . 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 Financial resource strain: Not on file  . Food insecurity:    Worry: Not on file    Inability: Not on  file  . Transportation needs:    Medical: Not on file    Non-medical: Not on file  Tobacco Use  . Smoking status: Former Smoker    Packs/day: 2.00    Years: 35.00    Pack years: 70.00    Types: Cigarettes    Last attempt to quit: 01/21/1989    Years since quitting: 29.7  . Smokeless tobacco: Former Systems developer    Types: [place] date: 08/23/2001  Substance and Sexual Activity  . Alcohol use: No  . Drug use: No  . Sexual activity: Not on file  Lifestyle  . Physical activity:    Days per week: Not on file    Minutes per session: Not on file  . Stress: Not on file  Relationships  . Social connections:    Talks on phone: Not on file    Gets together: Not on file    Attends religious service: Not on file    Active member of  club or organization: Not on file    Attends meetings of clubs or organizations: Not on file    Relationship status: Not on file  . Intimate partner violence:    Fear of current or ex partner: Not on file    Emotionally abused: Not on file    Physically abused: Not on file    Forced sexual activity: Not on file  Other Topics Concern  . Not on file  Social History Narrative  . Not on file   Family History  Problem Relation Age of Onset  . Heart disease Father   . Colon cancer Father   . Prostate cancer Father   . Stroke Mother   . Endometrial cancer Sister   . Stroke Maternal Grandmother   . Cancer Paternal Grandmother       VITAL SIGNS BP 139/71   Pulse 64   Temp (!) 97.2 F (36.2 C)   Resp 20   Ht 5' 6"  (1.676 m)   Wt 124 lb (56.2 kg)   SpO2 93%   BMI 20.01 kg/m   Outpatient Encounter Medications as of 10/26/2018  Medication Sig  . acetaminophen (TYLENOL) 325 MG tablet Take 2 tablets (650 mg total) by mouth every 6 (six) hours as needed for fever.  Marland Kitchen albuterol (PROVENTIL HFA;VENTOLIN HFA) 108 (90 Base) MCG/ACT inhaler Inhale 2 puffs into the lungs every 4 (four) hours as needed for wheezing or shortness of breath.  Marland Kitchen albuterol (PROVENTIL) (2.5 MG/3ML) 0.083% nebulizer solution Take 2.5 mg by nebulization every 4 (four) hours as needed for wheezing or shortness of breath.  . ALPRAZolam (XANAX) 0.25 MG tablet Take 1 tablet (0.25 mg total) by mouth 2 (two) times daily as needed for up to 14 days for anxiety.  Marland Kitchen amiodarone (PACERONE) 200 MG tablet Take 1/2 tablet (100 mg) by mouth daily  . aspirin EC 81 MG tablet Take 81 mg by mouth daily.  Marland Kitchen atorvastatin (LIPITOR) 20 MG tablet Take 1 tablet (20 mg total) by mouth daily.  . Azelastine HCl 137 MCG/SPRAY SOLN Place 2 sprays into both nostrils 2 (two) times daily.  . budesonide-formoterol (SYMBICORT) 160-4.5 MCG/ACT inhaler Inhale 2 puffs into the lungs every 12 (twelve) hours.  . calcitonin, salmon, (MIACALCIN/FORTICAL) 200  UNIT/ACT nasal spray Place 1 spray into alternate nostrils. Every other day  . Calcium Carbonate-Vitamin D (CALTRATE 600+D) 600-400 MG-UNIT tablet Take 1 tablet by mouth daily.   Marland Kitchen dextromethorphan-guaiFENesin (MUCINEX DM) 30-600 MG 12hr tablet Take 1 tablet  by mouth 2 (two) times daily.  Marland Kitchen HYDROcodone-homatropine (HYCODAN) 5-1.5 MG/5ML syrup Take 5 mLs by mouth every 6 (six) hours as needed for cough.  . LUTEIN PO Take 1 capsule by mouth daily.  . montelukast (SINGULAIR) 10 MG tablet Take 10 mg by mouth at bedtime.  . Multiple Vitamins-Minerals (CENTRUM SILVER 50+MEN) TABS Take 1 tablet by mouth daily.  . NON FORMULARY Diet Type:  Gluten Free  . nystatin (MYCOSTATIN) 100000 UNIT/ML suspension Take 5 mLs by mouth 4 (four) times daily. Swish and spit for 7 days  . OXYGEN Inhale 5 L/min into the lungs continuous.   . pantoprazole (PROTONIX) 40 MG tablet Take 40 mg by mouth daily.  . polyethylene glycol (MIRALAX / GLYCOLAX) packet Take 17 g by mouth daily as needed for mild constipation. Mix in 8 oz liquid and drink  . predniSONE (DELTASONE) 5 MG tablet Take 5 mg by mouth daily with breakfast.  . sacubitril-valsartan (ENTRESTO) 49-51 MG Take 1 tablet by mouth 2 (two) times daily.  Marland Kitchen spironolactone (ALDACTONE) 25 MG tablet Take 12.5 mg by mouth daily.  . tamsulosin (FLOMAX) 0.4 MG CAPS capsule Take 0.4 mg by mouth daily. Give 30 minutes after same meal each day  . Tiotropium Bromide Monohydrate (SPIRIVA RESPIMAT) 2.5 MCG/ACT AERS Inhale 2 puffs into the lungs 2 (two) times daily.  Marland Kitchen UNABLE TO FIND Apply Xerofoam gauze to Right forearm and cover with a dry dressing.  Check daily and change every 3 days and as needed   No facility-administered encounter medications on file as of 10/26/2018.      SIGNIFICANT DIAGNOSTIC EXAMS   PREVIOUS;   10-12-18: chest x-ray:  1. Bilateral lower lobe opacities compatible with atelectasis and or pneumonia. 2. Advanced changes of emphysema and aortic  atherosclerosis. Aortic Atherosclerosis and Emphysema   NO NEW EXAMS    LABS REVIEWED PREVIOUS;   10-12-18: wbc 15.0; hgb 15.4; hct 49.6; mcv 104.2 plt 123; glucose 105; bun 26; creat 1.33; k+ 4.2; na++ 135; ca 8.9 total bili 1.3; protein 5.8; albumin 3.4 blood culture: no growth  10-14-18: wbc 11.6; hgb 12.4; ht 37.6; mcv 101.3 ;plt 94; glucose 142; bun 30; create 1.15; k+ 4.2; na++ 137; ca 8.7 10-17-18: wbc 7.4; hgb 11.3; hct 35.6; mcv 101.4; plt 82;  10-19-18: flu swab: + A flu  NO NEW LABS.   Review of Systems  Constitutional: Negative for malaise/fatigue.  Respiratory: Negative for cough and shortness of breath.        Is on long term 02  Cardiovascular: Negative for chest pain, palpitations and leg swelling.  Gastrointestinal: Negative for abdominal pain, constipation and heartburn.  Musculoskeletal: Negative for back pain, joint pain and myalgias.  Skin: Negative.   Neurological: Negative for dizziness.  Psychiatric/Behavioral: The patient is not nervous/anxious.      Physical Exam Constitutional:      General: He is not in acute distress.    Appearance: He is well-developed. He is not diaphoretic.     Comments: thin  Neck:     Thyroid: No thyromegaly.  Cardiovascular:     Rate and Rhythm: Normal rate and regular rhythm.     Heart sounds: Normal heart sounds.  Pulmonary:     Effort: Pulmonary effort is normal. No respiratory distress.     Breath sounds: Normal breath sounds.     Comments: Breath sounds diminished bilateral lower lobes 02 dependent  Abdominal:     General: Bowel sounds are normal. There is no distension.  Palpations: Abdomen is soft.     Tenderness: There is no abdominal tenderness.  Musculoskeletal:     Right lower leg: No edema.     Left lower leg: No edema.     Comments: Is able to move all extremities   Lymphadenopathy:     Cervical: No cervical adenopathy.  Skin:    General: Skin is warm and dry.  Neurological:     Mental Status: He is  alert and oriented to person, place, and time.      ASSESSMENT/ PLAN:  TODAY;   1. Chronic combined systolic and diastolic heart failure: EF 55-60% (06-10-17) is stable will continue entresto 49/51 mg twice daily aldactone 12.5 mg daily  Asa 81 mg daily   2. PVC's (premature ventricular contractions) heart rate stable will continue amiodarone 100 mg daily for rate control   3. COPD GOLD II: is stable is 02 dependent; will continue albuterol 2 puffs or neb every 4 hours as needed; sprivia respimat 2 puffs twice daily; azelastine 2 puffs twice daily symbicort 160/4.5 mcg 2 puffs twice daily prednisone 5 mg daily mucinex dm twice daily has hycodan syrup 5 cc every 6 hours as needed for cough singulair 10 mg daily   4. Chronic kidney disease stage III (moderate): is stable will monitor  5. GERD without esophagitis: is stable will continue protonix 40 mg daily   6. Dyslipidemia: is stable will continue lipitor 20 mg daily   7. Chronic anxiety: is emotionally stable will continue xanax 0.25 mg twice daily as needed for 14 days.      MD is aware of resident's narcotic use and is in agreement with current plan of care. We will attempt to wean resident as apropriate   Ok Edwards NP [facility]  Contact [phone] Monday through Friday 8am- 5pm  After hours call [phone]

## 2018-10-31 ENCOUNTER — Non-Acute Institutional Stay (SKILLED_NURSING_FACILITY): Payer: Medicare Other | Admitting: Adult Health

## 2018-10-31 ENCOUNTER — Other Ambulatory Visit: Payer: Self-pay | Admitting: Adult Health

## 2018-10-31 ENCOUNTER — Encounter: Payer: Self-pay | Admitting: Adult Health

## 2018-10-31 ENCOUNTER — Ambulatory Visit (HOSPITAL_COMMUNITY): Payer: Medicare Other

## 2018-10-31 DIAGNOSIS — I255 Ischemic cardiomyopathy: Secondary | ICD-10-CM | POA: Diagnosis not present

## 2018-10-31 DIAGNOSIS — J449 Chronic obstructive pulmonary disease, unspecified: Secondary | ICD-10-CM | POA: Diagnosis not present

## 2018-10-31 DIAGNOSIS — J9611 Chronic respiratory failure with hypoxia: Secondary | ICD-10-CM

## 2018-10-31 MED ORDER — PANTOPRAZOLE SODIUM 40 MG PO TBEC: 40.0000 mg | DELAYED_RELEASE_TABLET | Freq: Every day | ORAL | 0 refills | Status: DC

## 2018-10-31 MED ORDER — CALCITONIN (SALMON) 200 UNIT/ACT NA SOLN: 1.0000 | Freq: Every day | NASAL | 0 refills | Status: AC

## 2018-10-31 MED ORDER — MONTELUKAST SODIUM 10 MG PO TABS: 10.0000 mg | ORAL_TABLET | Freq: Every day | ORAL | 0 refills | Status: DC

## 2018-10-31 MED ORDER — AMIODARONE HCL 200 MG PO TABS: 100.0000 mg | ORAL_TABLET | Freq: Every day | ORAL | 0 refills | Status: DC

## 2018-10-31 MED ORDER — TAMSULOSIN HCL 0.4 MG PO CAPS: 0.4000 mg | ORAL_CAPSULE | Freq: Every day | ORAL | 0 refills | Status: DC

## 2018-10-31 MED ORDER — PREDNISONE 5 MG PO TABS: 5.0000 mg | ORAL_TABLET | Freq: Every day | ORAL | 0 refills | Status: DC

## 2018-10-31 MED ORDER — ATORVASTATIN CALCIUM 20 MG PO TABS: 20.0000 mg | ORAL_TABLET | Freq: Every day | ORAL | 0 refills | Status: DC

## 2018-10-31 MED ORDER — HYDROCODONE-HOMATROPINE 5-1.5 MG/5ML PO SYRP: 5.0000 mL | ORAL_SOLUTION | Freq: Four times a day (QID) | ORAL | 0 refills | Status: DC | PRN

## 2018-10-31 MED ORDER — TIOTROPIUM BROMIDE MONOHYDRATE 2.5 MCG/ACT IN AERS: 2.0000 | INHALATION_SPRAY | Freq: Two times a day (BID) | RESPIRATORY_TRACT | 0 refills | Status: DC

## 2018-10-31 MED ORDER — BUDESONIDE-FORMOTEROL FUMARATE 160-4.5 MCG/ACT IN AERO: 2.0000 | INHALATION_SPRAY | Freq: Two times a day (BID) | RESPIRATORY_TRACT | 0 refills | Status: DC

## 2018-10-31 MED ORDER — SACUBITRIL-VALSARTAN 49-51 MG PO TABS: 1.0000 | ORAL_TABLET | Freq: Two times a day (BID) | ORAL | 0 refills | Status: DC

## 2018-10-31 MED ORDER — AZELASTINE HCL 137 MCG/SPRAY NA SOLN: 2.0000 | Freq: Two times a day (BID) | NASAL | 0 refills | Status: DC

## 2018-10-31 MED ORDER — ALPRAZOLAM 0.25 MG PO TABS: 0.2500 mg | ORAL_TABLET | Freq: Two times a day (BID) | ORAL | 0 refills | Status: DC | PRN

## 2018-10-31 MED ORDER — SPIRONOLACTONE 25 MG PO TABS: 12.5000 mg | ORAL_TABLET | Freq: Every day | ORAL | 0 refills | Status: DC

## 2018-10-31 MED ORDER — ALBUTEROL SULFATE HFA 108 (90 BASE) MCG/ACT IN AERS: 2.0000 | INHALATION_SPRAY | RESPIRATORY_TRACT | 0 refills | Status: DC | PRN

## 2018-10-31 MED ORDER — ALBUTEROL SULFATE (2.5 MG/3ML) 0.083% IN NEBU: 2.5000 mg | INHALATION_SOLUTION | RESPIRATORY_TRACT | 0 refills | Status: DC | PRN

## 2018-10-31 NOTE — Progress Notes (Signed)
Location:   [place] Room Number: 132 P Place of Service:  SNF (31)    CODE STATUS: Full Code  Allergies  Allergen Reactions  . Codeine Other (See Comments)    Thought head was going to blow off/ severe headache  . Gluten Meal Other (See Comments)    Celiac disease  . Adhesive [Tape] Other (See Comments)    Band-aids - tears skin off    Chief Complaint  Patient presents with  . Discharge Note    Discharging to home on 11/01/2018    HPI:  He is being discharged to home with home health for pt/ot/rn. He will need a rollator. He will need his prescriptions written and will need to follow up with his medical provider.  He had been hospitalized for copd and CAP. He was admitted to this facility for short term rehab. He has been treated for influenza A. He is now ready for discharge to home.    Past Medical History:  Diagnosis Date  . Allergic rhinitis   . Anxiety   . Bronchiectasis   . CAD (coronary artery disease)   . Celiac disease   . Chronic heart failure ([place])   . Chronic kidney disease (CKD), stage III (moderate) (HCC)   . COPD (chronic obstructive pulmonary disease) ([place])   . Diverticulosis of colon (without mention of hemorrhage)   . Esophageal reflux   . Family history of adverse reaction to anesthesia    daughter gets PONV  . Family hx of colon cancer   . History of stomach ulcers 1980s  . Hyperlipemia   . Hypertension   . 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 Myocardial infarction [facility])    "previous MI/echo in 12/2015"  . Nephrolithiasis    "got them now; never had OR/scopes" (02/03/2016)  . On home oxygen therapy    "3L-5L; 24/7" (12/29/2017)  . Other diseases of lung, not elsewhere classified   . Personal history of colonic polyps 04/26/2007   hyperplastic   . Pneumonia "several times"    Past Surgical History:  Procedure Laterality Date  . CARDIAC CATHETERIZATION  02/03/2016  . CARDIAC  CATHETERIZATION  09/30/2009   non-obstructive CAD w/30% narrowing in prox LAD (Dr. Waunita Schooner)  . CARDIAC CATHETERIZATION  05/04/2001   same at 2011 cath (Dr. Waunita Schooner)  . CARDIAC CATHETERIZATION N/A 02/03/2016   Procedure: Right/Left Heart Cath and Coronary Angiography;  Surgeon: Pixie Casino, MD;  Location: [place] CV LAB;  Service: Cardiovascular;  Laterality: N/A;  . EPIGLOTOPLASTY W/ MLB     removal due to carcinoma  . EXCISIONAL HEMORRHOIDECTOMY  2000s  . HAND SURGERY Right 1958   d/t crush injury  . TONSILLECTOMY AND ADENOIDECTOMY    . ULTRASOUND GUIDANCE FOR VASCULAR ACCESS  02/03/2016   Procedure: Ultrasound Guidance For Vascular Access;  Surgeon: Pixie Casino, MD;  Location: [place] CV LAB;  Service: Cardiovascular;;  . VASECTOMY      Social History   Socioeconomic History  . Marital status: Married    Spouse name: Not on file  . Number of children: 3  . Years of education: Not on file  . Highest education level: Not on file  Occupational History  . 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 Financial resource strain: Not on file  . Food insecurity:    Worry: Not  on file    Inability: Not on file  . Transportation needs:    Medical: Not on file    Non-medical: Not on file  Tobacco Use  . Smoking status: Former Smoker    Packs/day: 2.00    Years: 35.00    Pack years: 70.00    Types: Cigarettes    Last attempt to quit: 01/21/1989    Years since quitting: 29.7  . Smokeless tobacco: Former Systems developer    Types: [place] date: 08/23/2001  Substance and Sexual Activity  . Alcohol use: No  . Drug use: No  . Sexual activity: Not on file  Lifestyle  . Physical activity:    Days per week: Not on file    Minutes per session: Not on file  . Stress: Not on file  Relationships  . Social connections:    Talks on phone: Not on file    Gets together: Not on file    Attends religious service: Not on file     Active member of club or organization: Not on file    Attends meetings of clubs or organizations: Not on file    Relationship status: Not on file  . Intimate partner violence:    Fear of current or ex partner: Not on file    Emotionally abused: Not on file    Physically abused: Not on file    Forced sexual activity: Not on file  Other Topics Concern  . Not on file  Social History Narrative  . Not on file   Family History  Problem Relation Age of Onset  . Heart disease Father   . Colon cancer Father   . Prostate cancer Father   . Stroke Mother   . Endometrial cancer Sister   . Stroke Maternal Grandmother   . Cancer Paternal Grandmother     VITAL SIGNS BP 100/60   Pulse 62   Temp 98.1 F (36.7 C)   Resp 20   Ht 5' 6"  (1.676 m)   Wt 123 lb 12.8 oz (56.2 kg)   SpO2 96%   BMI 19.98 kg/m   Patient's Medications  New Prescriptions   No medications on file  Previous Medications   ACETAMINOPHEN (TYLENOL) 325 MG TABLET    Take 2 tablets (650 mg total) by mouth every 6 (six) hours as needed for fever.   ALBUTEROL (PROVENTIL HFA;VENTOLIN HFA) 108 (90 BASE) MCG/ACT INHALER    Inhale 2 puffs into the lungs every 4 (four) hours as needed for wheezing or shortness of breath.   ALBUTEROL (PROVENTIL) (2.5 MG/3ML) 0.083% NEBULIZER SOLUTION    Take 2.5 mg by nebulization every 4 (four) hours as needed for wheezing or shortness of breath.   ALPRAZOLAM (XANAX) 0.25 MG TABLET    Take 1 tablet (0.25 mg total) by mouth 2 (two) times daily as needed for up to 14 days for anxiety.   AMIODARONE (PACERONE) 200 MG TABLET    Take 1/2 tablet (100 mg) by mouth daily   ASPIRIN EC 81 MG TABLET    Take 81 mg by mouth daily.   ATORVASTATIN (LIPITOR) 20 MG TABLET    Take 1 tablet (20 mg total) by mouth daily.   AZELASTINE HCL 137 MCG/SPRAY SOLN    Place 2 sprays into both nostrils 2 (two) times daily.   BUDESONIDE-FORMOTEROL (SYMBICORT) 160-4.5 MCG/ACT INHALER    Inhale 2 puffs into the lungs every 12  (twelve) hours.   CALCITONIN, SALMON, (MIACALCIN/FORTICAL) 200 UNIT/ACT NASAL  SPRAY    Place 1 spray into alternate nostrils. Every other day   CALCIUM CARBONATE-VITAMIN D (CALTRATE 600+D) 600-400 MG-UNIT TABLET    Take 1 tablet by mouth daily.    DEXTROMETHORPHAN-GUAIFENESIN (MUCINEX DM) 30-600 MG 12HR TABLET    Take 1 tablet by mouth 2 (two) times daily.   HYDROCODONE-HOMATROPINE (HYCODAN) 5-1.5 MG/5ML SYRUP    Take 5 mLs by mouth every 6 (six) hours as needed for cough.   LUTEIN PO    Take 1 capsule by mouth daily.   MONTELUKAST (SINGULAIR) 10 MG TABLET    Take 10 mg by mouth at bedtime.   MULTIPLE VITAMINS-MINERALS (CENTRUM SILVER 50+MEN) TABS    Take 1 tablet by mouth daily.   NON FORMULARY    Diet Type:  Gluten Free   OXYGEN    Inhale 4-6 L/min into the lungs continuous. To maintain sats > 88%   PANTOPRAZOLE (PROTONIX) 40 MG TABLET    Take 40 mg by mouth daily.   POLYETHYLENE GLYCOL (MIRALAX / GLYCOLAX) PACKET    Take 17 g by mouth daily as needed for mild constipation. Mix in 8 oz liquid and drink   PREDNISONE (DELTASONE) 5 MG TABLET    Take 5 mg by mouth daily with breakfast.   SACUBITRIL-VALSARTAN (ENTRESTO) 49-51 MG    Take 1 tablet by mouth 2 (two) times daily.   SPIRONOLACTONE (ALDACTONE) 25 MG TABLET    Take 12.5 mg by mouth daily.   TAMSULOSIN (FLOMAX) 0.4 MG CAPS CAPSULE    Take 0.4 mg by mouth daily. Give 30 minutes after same meal each day   TIOTROPIUM BROMIDE MONOHYDRATE (SPIRIVA RESPIMAT) 2.5 MCG/ACT AERS    Inhale 2 puffs into the lungs 2 (two) times daily.   UNABLE TO FIND    Apply Xerofoam gauze to Right forearm and cover with a dry dressing.  Check daily and change every 3 days and as needed  Modified Medications   No medications on file  Discontinued Medications   No medications on file     SIGNIFICANT DIAGNOSTIC EXAMS  PREVIOUS;   10-12-18: chest x-ray:  1. Bilateral lower lobe opacities compatible with atelectasis and or pneumonia. 2. Advanced changes of  emphysema and aortic atherosclerosis. Aortic Atherosclerosis and Emphysema   NO NEW EXAMS    LABS REVIEWED PREVIOUS;   10-12-18: wbc 15.0; hgb 15.4; hct 49.6; mcv 104.2 plt 123; glucose 105; bun 26; creat 1.33; k+ 4.2; na++ 135; ca 8.9 total bili 1.3; protein 5.8; albumin 3.4 blood culture: no growth  10-14-18: wbc 11.6; hgb 12.4; ht 37.6; mcv 101.3 ;plt 94; glucose 142; bun 30; create 1.15; k+ 4.2; na++ 137; ca 8.7 10-17-18: wbc 7.4; hgb 11.3; hct 35.6; mcv 101.4; plt 82;  10-19-18: flu swab: + A flu  NO NEW LABS.   Review of Systems  Constitutional: Negative for malaise/fatigue.  Respiratory: Negative for cough and shortness of breath.        On long term 02  Cardiovascular: Negative for chest pain, palpitations and leg swelling.  Gastrointestinal: Negative for abdominal pain, constipation and heartburn.  Musculoskeletal: Negative for back pain, joint pain and myalgias.  Skin: Negative.   Neurological: Negative for dizziness.  Psychiatric/Behavioral: The patient is not nervous/anxious.      Physical Exam Constitutional:      General: He is not in acute distress.    Appearance: He is well-developed. He is not diaphoretic.     Comments: thin  Neck:     Musculoskeletal: Neck  supple.     Thyroid: No thyromegaly.  Cardiovascular:     Rate and Rhythm: Normal rate and regular rhythm.     Pulses: Normal pulses.     Heart sounds: Normal heart sounds.  Pulmonary:     Effort: Pulmonary effort is normal. No respiratory distress.     Comments: 02 dependent Breath sounds diminished bilaterally  Abdominal:     General: Bowel sounds are normal. There is no distension.     Palpations: Abdomen is soft.     Tenderness: There is no abdominal tenderness.  Musculoskeletal:     Right lower leg: No edema.     Left lower leg: No edema.     Comments: Is able to move all extremities   Lymphadenopathy:     Cervical: No cervical adenopathy.  Skin:    General: Skin is warm and dry.    Neurological:     Mental Status: He is alert and oriented to person, place, and time.  Psychiatric:        Mood and Affect: Mood normal.      ASSESSMENT/ PLAN:  Patient is being discharged with the following home health services:  Pt/ot/rn: to evaluate and treat as indicated for gait balance strength adl training medication management   Patient is being discharged with the following durable medical equipment:  rollator to allow him to maintain his current level of independence with his adls.   Patient has been advised to f/u with their PCP in 1-2 weeks to bring them up to date on their rehab stay.  Social services at facility was responsible for arranging this appointment.  Pt was provided with a 30 day supply of prescriptions for medications and refills must be obtained from their PCP.  For controlled substances, a more limited supply may be provided adequate until PCP appointment only.   A 30 day supply of his prescription medications have been sent to [facility] with #20 xanax 0.25 mg tabs and 90 mL hycodan syrup.   Time spent with patient 35 minutes; discussed medication; home health needs and dme. Verbalized understanding.    Ok Edwards NP [facility]  Contact [phone] Monday through Friday 8am- 5pm  After hours call [phone]

## 2018-11-02 ENCOUNTER — Ambulatory Visit (HOSPITAL_COMMUNITY): Payer: Medicare Other

## 2018-11-04 DIAGNOSIS — F419 Anxiety disorder, unspecified: Secondary | ICD-10-CM | POA: Insufficient documentation

## 2018-11-04 DIAGNOSIS — E785 Hyperlipidemia, unspecified: Secondary | ICD-10-CM | POA: Insufficient documentation

## 2018-11-07 ENCOUNTER — Ambulatory Visit (HOSPITAL_COMMUNITY): Payer: Medicare Other

## 2018-11-09 ENCOUNTER — Ambulatory Visit (HOSPITAL_COMMUNITY): Payer: Medicare Other

## 2018-11-10 ENCOUNTER — Inpatient Hospital Stay: Payer: Medicare Other | Admitting: Family Medicine

## 2018-11-14 ENCOUNTER — Other Ambulatory Visit: Payer: Medicare Other

## 2018-11-14 ENCOUNTER — Ambulatory Visit (HOSPITAL_COMMUNITY): Payer: Medicare Other

## 2018-11-16 ENCOUNTER — Ambulatory Visit (HOSPITAL_COMMUNITY): Payer: Medicare Other

## 2018-11-21 ENCOUNTER — Ambulatory Visit (HOSPITAL_COMMUNITY): Payer: Medicare Other

## 2018-11-23 ENCOUNTER — Ambulatory Visit (HOSPITAL_COMMUNITY): Payer: Medicare Other

## 2018-11-28 ENCOUNTER — Ambulatory Visit (HOSPITAL_COMMUNITY): Payer: Medicare Other

## 2018-11-28 ENCOUNTER — Other Ambulatory Visit: Payer: Self-pay | Admitting: Family Medicine

## 2018-11-28 MED ORDER — ALPRAZOLAM 0.25 MG PO TABS: 0.2500 mg | ORAL_TABLET | Freq: Two times a day (BID) | ORAL | 2 refills | Status: DC | PRN

## 2018-11-28 NOTE — Telephone Encounter (Signed)
Pt is requesting refill on Xanax   LOV: 10/12/18  LRF:  10/31/18

## 2018-11-28 NOTE — Telephone Encounter (Signed)
Refill on xanax to [facility] - only has 1 pill left

## 2018-11-30 ENCOUNTER — Ambulatory Visit (HOSPITAL_COMMUNITY): Payer: Medicare Other

## 2018-12-05 ENCOUNTER — Ambulatory Visit (HOSPITAL_COMMUNITY): Payer: Medicare Other

## 2018-12-07 ENCOUNTER — Ambulatory Visit (HOSPITAL_COMMUNITY): Payer: Medicare Other

## 2018-12-12 ENCOUNTER — Other Ambulatory Visit: Payer: Self-pay | Admitting: Adult Health

## 2018-12-12 ENCOUNTER — Ambulatory Visit (HOSPITAL_COMMUNITY): Payer: Medicare Other

## 2018-12-14 ENCOUNTER — Other Ambulatory Visit (HOSPITAL_COMMUNITY): Payer: Self-pay | Admitting: Internal Medicine

## 2018-12-14 ENCOUNTER — Ambulatory Visit (INDEPENDENT_AMBULATORY_CARE_PROVIDER_SITE_OTHER): Payer: Medicare Other | Admitting: Family Medicine

## 2018-12-14 ENCOUNTER — Ambulatory Visit (HOSPITAL_COMMUNITY)
Admission: RE | Admit: 2018-12-14 | Discharge: 2018-12-14 | Disposition: A | Discharge status: Home / Self Care | Payer: Medicare Other | Source: Ambulatory Visit | Attending: Family Medicine | Admitting: Family Medicine

## 2018-12-14 ENCOUNTER — Ambulatory Visit (HOSPITAL_COMMUNITY): Payer: Medicare Other

## 2018-12-14 ENCOUNTER — Other Ambulatory Visit: Payer: Self-pay | Admitting: Family Medicine

## 2018-12-14 ENCOUNTER — Other Ambulatory Visit: Payer: Self-pay

## 2018-12-14 DIAGNOSIS — G3281 Cerebellar ataxia in diseases classified elsewhere: Secondary | ICD-10-CM

## 2018-12-14 DIAGNOSIS — R270 Ataxia, unspecified: Secondary | ICD-10-CM | POA: Diagnosis present

## 2018-12-14 DIAGNOSIS — R42 Dizziness and giddiness: Secondary | ICD-10-CM | POA: Insufficient documentation

## 2018-12-14 DIAGNOSIS — R296 Repeated falls: Secondary | ICD-10-CM | POA: Diagnosis not present

## 2018-12-14 LAB — POCT I-STAT CREATININE: Creatinine, Ser: 1.1 mg/dL (ref 0.61–1.24)

## 2018-12-14 MED ORDER — GADOBUTROL 1 MMOL/ML IV SOLN
5.0000 mL | Freq: Once | INTRAVENOUS | Status: AC | PRN
  Administered 2018-12-14: 13:00:00 5 mL via INTRAVENOUS

## 2018-12-14 NOTE — Progress Notes (Signed)
Subjective:    Patient ID: Bruce Travis, male    DOB: Sep 29, 1934, 84 y.o.   MRN: [id number]  HPI Patient is an 84 year old Caucasian male being seen today over the telephone.  He refused to go to the hospital.  He refuses to come to the clinic due to concern about the COVID-19 pandemic.  Phone call began 930.  Phone call ended at 956.  Patient states that 2 weeks ago, suddenly, he started experiencing extreme dizziness and disequilibrium.  As long as he is sitting in a chair or lying in bed, he does not experience any dizziness.  If he rolls over in bed or lies down in bed he does not experience dizziness.  However as soon as he stands, he is unable to keep his balance.  He is fallen multiple times.  He is fallen in the bathroom and landed against the tub.  He has fallen 2 times while walking despite holding onto a walker.  He states that he staggers into objects including chairs in the wall.  He denies any vertigo.  The room is not spinning.  He just is extremely ataxic and unable to keep his balance.  He denies any orthostatic dizziness.  He does not feel like he is going to pass out.  He is unable to check his blood pressure at home.  However he has been checking his heart rate and it has been between 60 and 80 bpm every time he is checked it.  He denies any bradycardia.  His hemoglobin was last checked in March and was 45 while he was in the hospital.  He denies any melena or blood loss that would cause cerebral hypoperfusion.  He denies any ear pain.  He denies any sinus pain.  He denies any fevers or chills.  He denies any chest pain shortness of breath dyspnea on exertion or cough.  He denies any nausea or vomiting or dysuria.  Sudden onset of ataxia and dizziness is concerning for a cerebellar stroke.  Vertigo would be less likely and orthostatic hypotension would also be less likely based on his description Past Medical History:  Diagnosis Date  . Allergic rhinitis   . Anxiety   . Bronchiectasis    . CAD (coronary artery disease)   . Celiac disease   . Chronic heart failure ([place])   . Chronic kidney disease (CKD), stage III (moderate) (H[facility])   . COPD (chronic obstructive pulmonary disease) ([place])   . Diverticulosis of colon (without mention of hemorrhage)   . Esophageal reflux   . Family history of adverse reaction to anesthesia    daughter gets PONV  . Family hx of colon cancer   . History of stomach ulcers 1980s  . Hyperlipemia   . Hypertension   . 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 Myocardial infarction [facility])    "previous MI/echo in 12/2015"  . Nephrolithiasis    "got them now; never had OR/scopes" (02/03/2016)  . On home oxygen therapy    "3L-5L; 24/7" (12/29/2017)  . Other diseases of lung, not elsewhere classified   . Personal history of colonic polyps 04/26/2007   hyperplastic   . Pneumonia "several times"   Past Surgical History:  Procedure Laterality Date  . CARDIAC CATHETERIZATION  02/03/2016  . CARDIAC CATHETERIZATION  09/30/2009   non-obstructive CAD w/30% narrowing in prox LAD (Dr. Waunita Schooner)  . CARDIAC CATHETERIZATION  05/04/2001   same at 2011 cath (Dr. B.  Brodie)  . CARDIAC CATHETERIZATION N/A 02/03/2016   Procedure: Right/Left Heart Cath and Coronary Angiography;  Surgeon: Pixie Casino, MD;  Location: [place] [facility];  Service: Cardiovascular;  Laterality: N/A;  . EPIGLOTOPLASTY W/ MLB     removal due to carcinoma  . EXCISIONAL HEMORRHOIDECTOMY  2000s  . HAND SURGERY Right 1958   d/t crush injury  . TONSILLECTOMY AND ADENOIDECTOMY    . ULTRASOUND GUIDANCE FOR VASCULAR ACCESS  02/03/2016   Procedure: Ultrasound Guidance For Vascular Access;  Surgeon: Pixie Casino, MD;  Location: [place] [facility];  Service: Cardiovascular;;  . VASECTOMY     Current Outpatient Medications on File Prior to Visit  Medication Sig Dispense Refill  . acetaminophen (TYLENOL) 325 MG tablet Take 2 tablets (650 mg total) by mouth every 6 (six)  hours as needed for fever.    Marland Kitchen albuterol (PROVENTIL HFA;VENTOLIN HFA) 108 (90 Base) MCG/ACT inhaler Inhale 2 puffs into the lungs every 4 (four) hours as needed for wheezing or shortness of breath. 1 Inhaler 0  . albuterol (PROVENTIL) (2.5 MG/3ML) 0.083% nebulizer solution Take 3 mLs (2.5 mg total) by nebulization every 4 (four) hours as needed for wheezing or shortness of breath. 360 mL 0  . ALPRAZolam (XANAX) 0.25 MG tablet Take 1 tablet (0.25 mg total) by mouth 2 (two) times daily as needed for anxiety. 30 tablet 2  . amiodarone (PACERONE) 200 MG tablet Take 0.5 tablets (100 mg total) by mouth daily. Take 1/2 tablet (100 mg) by mouth daily 15 tablet 0  . aspirin EC 81 MG tablet Take 81 mg by mouth daily.    Marland Kitchen atorvastatin (LIPITOR) 20 MG tablet Take 1 tablet (20 mg total) by mouth daily. 30 tablet 0  . Azelastine HCl 137 MCG/SPRAY SOLN Place 2 sprays into both nostrils 2 (two) times daily. 1 Bottle 0  . budesonide-formoterol (SYMBICORT) 160-4.5 MCG/ACT inhaler Inhale 2 puffs into the lungs every 12 (twelve) hours. 1 Inhaler 0  . calcitonin, salmon, (MIACALCIN/FORTICAL) 200 UNIT/ACT nasal spray Place 1 spray into alternate nostrils daily. Every other day 3.7 mL 0  . Calcium Carbonate-Vitamin D (CALTRATE 600+D) 600-400 MG-UNIT tablet Take 1 tablet by mouth daily.     Marland Kitchen dextromethorphan-guaiFENesin (MUCINEX DM) 30-600 MG 12hr tablet Take 1 tablet by mouth 2 (two) times daily.    Marland Kitchen HYDROcodone-homatropine (HYCODAN) 5-1.5 MG/5ML syrup Take 5 mLs by mouth every 6 (six) hours as needed for cough. 90 mL 0  . LUTEIN PO Take 1 capsule by mouth daily.    . montelukast (SINGULAIR) 10 MG tablet Take 1 tablet (10 mg total) by mouth at bedtime. 30 tablet 0  . Multiple Vitamins-Minerals (CENTRUM SILVER 50+MEN) TABS Take 1 tablet by mouth daily.    . NON FORMULARY Diet Type:  Gluten Free    . OXYGEN Inhale 4-6 L/min into the lungs continuous. To maintain sats > 88%    . pantoprazole (PROTONIX) 40 MG tablet Take  1 tablet (40 mg total) by mouth daily. 30 tablet 0  . polyethylene glycol (MIRALAX / GLYCOLAX) packet Take 17 g by mouth daily as needed for mild constipation. Mix in 8 oz liquid and drink    . predniSONE (DELTASONE) 5 MG tablet Take 1 tablet (5 mg total) by mouth daily with breakfast. 30 tablet 0  . sacubitril-valsartan (ENTRESTO) 49-51 MG Take 1 tablet by mouth 2 (two) times daily. 60 tablet 0  . spironolactone (ALDACTONE) 25 MG tablet Take 0.5 tablets (12.5 mg total)  by mouth daily. 15 tablet 0  . tamsulosin (FLOMAX) 0.4 MG CAPS capsule Take 1 capsule (0.4 mg total) by mouth daily. Give 30 minutes after same meal each day 30 capsule 0  . Tiotropium Bromide Monohydrate (SPIRIVA RESPIMAT) 2.5 MCG/ACT AERS Inhale 2 puffs into the lungs 2 (two) times daily. 1 Inhaler 0  . UNABLE TO FIND Apply Xerofoam gauze to Right forearm and cover with a dry dressing.  Check daily and change every 3 days and as needed     No current facility-administered medications on file prior to visit.    Allergies  Allergen Reactions  . Codeine Other (See Comments)    Thought head was going to blow off/ severe headache  . Gluten Meal Other (See Comments)    Celiac disease  . Adhesive [Tape] Other (See Comments)    Band-aids - tears skin off   Social History   Socioeconomic History  . Marital status: Married    Spouse name: Not on file  . Number of children: 3  . Years of education: Not on file  . Highest education level: Not on file  Occupational History  . 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 Financial resource strain: Not on file  . Food insecurity:    Worry: Not on file    Inability: Not on file  . Transportation needs:    Medical: Not on file    Non-medical: Not on file  Tobacco Use  . Smoking status: Former Smoker    Packs/day: 2.00    Years: 35.00    Pack years: 70.00    Types: Cigarettes    Last attempt to quit: 01/21/1989    Years  since quitting: 29.9  . Smokeless tobacco: Former Systems developer    Types: [place] date: 08/23/2001  Substance and Sexual Activity  . Alcohol use: No  . Drug use: No  . Sexual activity: Not on file  Lifestyle  . Physical activity:    Days per week: Not on file    Minutes per session: Not on file  . Stress: Not on file  Relationships  . Social connections:    Talks on phone: Not on file    Gets together: Not on file    Attends religious service: Not on file    Active member of club or organization: Not on file    Attends meetings of clubs or organizations: Not on file    Relationship status: Not on file  . Intimate partner violence:    Fear of current or ex partner: Not on file    Emotionally abused: Not on file    Physically abused: Not on file    Forced sexual activity: Not on file  Other Topics Concern  . Not on file  Social History Narrative  . Not on file      Review of Systems  All other systems reviewed and are negative.      Objective:   Physical Exam  No physical exam could be performed today as patient was seen over the telephone      Assessment & Plan:  Dizziness on standing - Plan: MR Brain W Wo Contrast  Multiple falls - Plan: MR Brain W Wo Contrast  Ataxia - Plan: MR Brain W Wo Contrast  Cerebellar ataxia in diseases classified elsewhere [facility]) - Plan: MR Brain W Wo Contrast  Patient refuses to go to the  hospital and he refuses to come to the office due to fear about being exposed to COVID-19.  Therefore it is difficult to determine exactly the cause of his symptomatology however based on his description, his age and his multiple comorbidities, I am concerned about a posterior infarct/cerebellar stroke causing ataxia.  Therefore I will schedule an MRI of the brain urgently to determine this.  If there is in fact a stroke, we will need to focus on prevention and discuss changes in anticoagulation potentially as well as physical therapy and Occupational  Therapy going to his house to work with him.

## 2018-12-15 ENCOUNTER — Encounter: Payer: Self-pay | Admitting: Family Medicine

## 2018-12-15 ENCOUNTER — Ambulatory Visit (INDEPENDENT_AMBULATORY_CARE_PROVIDER_SITE_OTHER): Payer: Medicare Other | Admitting: Family Medicine

## 2018-12-15 VITALS — BP 156/64 | HR 77 | Temp 98.4°F | Resp 22 | Ht 66.5 in | Wt 125.0 lb

## 2018-12-15 DIAGNOSIS — R42 Dizziness and giddiness: Secondary | ICD-10-CM

## 2018-12-15 DIAGNOSIS — R296 Repeated falls: Secondary | ICD-10-CM

## 2018-12-15 DIAGNOSIS — R270 Ataxia, unspecified: Secondary | ICD-10-CM | POA: Diagnosis not present

## 2018-12-15 MED ORDER — MECLIZINE HCL 25 MG PO TABS: 25.0000 mg | ORAL_TABLET | Freq: Three times a day (TID) | ORAL | 0 refills | Status: DC | PRN

## 2018-12-15 NOTE — Progress Notes (Signed)
Subjective:    Patient ID: Bruce Travis, male    DOB: 07/28/1935, 84 y.o.   MRN: [id number]  HPI  12/14/18 Patient is an 84 year old Caucasian male being seen today over the telephone.  He refused to go to the hospital.  He refuses to come to the clinic due to concern about the COVID-19 pandemic.  Phone call began 930.  Phone call ended at 956.  Patient states that 2 weeks ago, suddenly, he started experiencing extreme dizziness and disequilibrium.  As long as he is sitting in a chair or lying in bed, he does not experience any dizziness.  If he rolls over in bed or lies down in bed he does not experience dizziness.  However as soon as he stands, he is unable to keep his balance.  He is fallen multiple times.  He is fallen in the bathroom and landed against the tub.  He has fallen 2 times while walking despite holding onto a walker.  He states that he staggers into objects including chairs in the wall.  He denies any vertigo.  The room is not spinning.  He just is extremely ataxic and unable to keep his balance.  He denies any orthostatic dizziness.  He does not feel like he is going to pass out.  He is unable to check his blood pressure at home.  However he has been checking his heart rate and it has been between 60 and 80 bpm every time he is checked it.  He denies any bradycardia.  His hemoglobin was last checked in March and was 45 while he was in the hospital.  He denies any melena or blood loss that would cause cerebral hypoperfusion.  He denies any ear pain.  He denies any sinus pain.  He denies any fevers or chills.  He denies any chest pain shortness of breath dyspnea on exertion or cough.  He denies any nausea or vomiting or dysuria.  Sudden onset of ataxia and dizziness is concerning for a cerebellar stroke.  Vertigo would be less likely and orthostatic hypotension would also be less likely based on his description.  At that time, my plan was: Patient refuses to go to the hospital and he refuses to  come to the office due to fear about being exposed to COVID-19.  Therefore it is difficult to determine exactly the cause of his symptomatology however based on his description, his age and his multiple comorbidities, I am concerned about a posterior infarct/cerebellar stroke causing ataxia.  Therefore I will schedule an MRI of the brain urgently to determine this.  If there is in fact a stroke, we will need to focus on prevention and discuss changes in anticoagulation potentially as well as physical therapy and Occupational Therapy going to his house to work with him. 12/15/18 MRI revealed: FINDINGS: Brain: No evidence for acute infarction, hemorrhage, mass lesion, or extra-axial fluid. Generalized atrophy. Hydrocephalus ex vacuo. No significant white matter disease.  Post infusion, no abnormal enhancement of the brain or meninges.  Vascular: Flow voids are maintained throughout the carotid, basilar, and vertebral arteries. There are no areas of chronic hemorrhage.  Skull and upper cervical spine: Normal marrow signal.  Sinuses/Orbits: Chronic and acute LEFT maxillary sinus disease. Negative orbits.  Other: None.  Therefore I asked the patient to come in today for a formal evaluation to determine the cause of his balance issues.  Patient continues to state that he is following.  He states that if he bends  over, for instance to pick up a garden hose such as this morning, he will lose his balance and toppled forward because he starts to feel woozy headed in his surroundings feel unstable.  While walking down the hallway, he was sometimes stagger into the wall as he starts to feel "woozy headed and he feels unstable on his feet.  He denies vertigo symptoms.  He denies any headache.  He denies any neurologic deficits other than falling.  He denies any weakness in his arms or legs.  He denies any slurred speech or facial droop.  On physical exam today cranial nerves II through XII are grossly  intact with muscle strength 5/5 equal and symmetric in the upper and lower extremity.  However the patient does have a very slow gait.  He seems very unsteady as he standing.  He hesitates to perform Romberg testing due to his fear of closing his eyes that he may fall.  Performed a Dix-Hallpike maneuver.  Turning his head to the right and lying him flat on the table caused him to feel slightly "woozy headed".  However when I turned his head to the left and laid him supine, his eyes begin to show 5-6 beats of horizontal nystagmus and he became extremely "woozy headed.  Therefore I believe that vertigo is exactly what the patient is experiencing.  Denies any neuropathy in his feet or hands.  He denies any numbness or tingling anywhere else on his body.  He denies any difficulty swallowing or trouble speaking he denies any symptoms of normal pressure hydrocephalus such as confusion or memory loss or urinary incontinence.  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 Waunita Schooner)   CARDIAC CATHETERIZATION  05/04/2001   same at 2011 cath (Dr. Waunita Schooner)   CARDIAC CATHETERIZATION N/A 02/03/2016   Procedure: Right/Left Heart Cath and Coronary Angiography;  Surgeon: Pixie Casino, MD;  Location: [place] [facility];  Service: Cardiovascular;  Laterality: N/A;   EPIGLOTOPLASTY W/ MLB     removal due to carcinoma   EXCISIONAL HEMORRHOIDECTOMY  2000s   HAND SURGERY Right 1958   d/t crush injury   TONSILLECTOMY AND ADENOIDECTOMY  ULTRASOUND GUIDANCE FOR VASCULAR ACCESS  02/03/2016   Procedure: Ultrasound Guidance For Vascular Access;  Surgeon: Pixie Casino, MD;  Location: [place] [facility];  Service: Cardiovascular;;   VASECTOMY     Current Outpatient Medications on File Prior to Visit  Medication Sig Dispense Refill   acetaminophen (TYLENOL) 325 MG tablet Take 2 tablets (650 mg total) by mouth every 6 (six) hours as needed for fever.     albuterol (PROVENTIL HFA;VENTOLIN HFA) 108 (90 Base) MCG/ACT inhaler Inhale 2 puffs into the lungs every 4 (four) hours as needed for wheezing or shortness of breath. 1 Inhaler 0   albuterol (PROVENTIL) (2.5 MG/3ML) 0.083% nebulizer solution Take 3 mLs (2.5 mg total) by nebulization every 4 (four) hours as needed for wheezing or shortness of breath. 360 mL 0   ALPRAZolam (XANAX) 0.25 MG tablet Take 1 tablet (0.25 mg total) by mouth 2 (two) times daily as needed for anxiety. 30 tablet 2   amiodarone (PACERONE) 200 MG tablet TAKE 1/2 TABLET BY MOUTH ONCE DAILY. 45 tablet 0   aspirin EC 81 MG tablet Take 81 mg by mouth daily.     atorvastatin (LIPITOR) 20 MG tablet TAKE ONE TABLET BY MOUTH ONCE DAILY. 90 tablet 0   Azelastine HCl 137 MCG/SPRAY SOLN Place 2 sprays into both nostrils 2 (two) times  daily. 1 Bottle 0   calcitonin, salmon, (MIACALCIN/FORTICAL) 200 UNIT/ACT nasal spray Place 1 spray into alternate nostrils daily. Every other day 3.7 mL 0   Calcium Carbonate-Vitamin D (CALTRATE 600+D) 600-400 MG-UNIT tablet Take 1 tablet by mouth daily.      dextromethorphan-guaiFENesin (MUCINEX DM) 30-600 MG 12hr tablet Take 1 tablet by mouth 2 (two) times daily.     ENTRESTO 49-51 MG TAKE (1) TABLET BY MOUTH TWICE DAILY. 180 tablet 0   HYDROcodone-homatropine (HYCODAN) 5-1.5 MG/5ML syrup Take 5 mLs by mouth every 6 (six) hours as needed for cough. 90 mL 0   LUTEIN PO Take 1 capsule by mouth daily.     montelukast (SINGULAIR) 10 MG tablet TAKE 1 TABLET BY MOUTH AT BEDTIME. 30 tablet 0   Multiple Vitamins-Minerals (CENTRUM SILVER 50+MEN) TABS Take 1 tablet by mouth daily.     NON FORMULARY Diet Type:  Gluten Free     OXYGEN Inhale 4-6 L/min into the lungs continuous. To maintain sats > 88%     pantoprazole (PROTONIX) 40 MG tablet Take 1 tablet (40 mg total) by mouth daily. 30 tablet 0   polyethylene glycol (MIRALAX / GLYCOLAX) packet Take 17 g by mouth daily as needed for mild constipation. Mix in 8 oz liquid and drink     predniSONE (DELTASONE) 5 MG tablet TAKE 1 TABLET BY MOUTH DAILY WITH BREAKFAST. 30 tablet 0   spironolactone (ALDACTONE) 25 MG tablet TAKE (1/2) TABLET BY MOUTH DAILY. 45 tablet 0   SYMBICORT 160-4.5 MCG/ACT inhaler INHALE 2 PUFFS INTO THE LUNGS EVERY 12 HOURS. 10.2 g 0   tamsulosin (FLOMAX) 0.4 MG CAPS capsule TAKE ONE CAPSULE BY MOUTH ONCE DAILY. 30 capsule 0   Tiotropium Bromide Monohydrate (SPIRIVA RESPIMAT) 2.5 MCG/ACT AERS Inhale 2 puffs into the lungs 2 (two) times daily. 1 Inhaler 0   UNABLE TO FIND Apply Xerofoam gauze to Right forearm and cover with a dry dressing.  Check daily and change every 3 days and as needed     No current facility-administered medications on file prior to visit.    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   Food insecurity:    Worry: Not on file    Inability: Not on file   Transportation needs:    Medical: Not on file    Non-medical: Not on file  Tobacco Use   Smoking status: Former Smoker    Packs/day: 2.00    Years: 35.00    Pack years: 70.00    Types: Cigarettes    Last attempt to quit: 01/21/1989    Years since quitting: 29.9   Smokeless tobacco: Former Systems developer    Types: Chew    Quit date: 08/23/2001  Substance and Sexual Activity   Alcohol use: No   Drug use: No   Sexual activity: Not on file  Lifestyle   Physical activity:    Days per week: Not on file    Minutes per session: Not on file   Stress: Not on file  Relationships   Social connections:    Talks on phone: Not on file    Gets together: Not on file    Attends religious service: Not on file    Active member of club or organization: Not on file    Attends meetings of clubs or organizations: Not on file    Relationship status: Not on file   Intimate partner violence:    Fear of current or ex partner: Not on file    Emotionally abused: Not on file    Physically abused: Not on file    Forced sexual activity: Not on file  Other Topics Concern   Not on file  Social History Narrative   Not on file      Review of Systems  All other systems reviewed and are negative.      Objective:   Physical Exam Vitals signs reviewed.  Constitutional:       General: He is not in acute distress.    Appearance: Normal appearance. He is not ill-appearing, toxic-appearing or diaphoretic.  HENT:     Head: Normocephalic and atraumatic.     Right Ear: There is impacted cerumen.     Left Ear: There is impacted cerumen.     Nose: Nose normal. No congestion or rhinorrhea.  Eyes:     Extraocular Movements: Extraocular movements intact.     Conjunctiva/sclera: Conjunctivae normal.     Pupils: Pupils are equal, round, and reactive to light.  Cardiovascular:     Rate and Rhythm: Normal rate.     Pulses: Normal pulses.     Heart sounds: Normal heart sounds. No murmur. No gallop.   Pulmonary:     Effort: Pulmonary effort is normal. No respiratory distress.     Breath sounds: Normal breath sounds. No stridor. No wheezing, rhonchi or rales.  Chest:     Chest wall: No tenderness.  Neurological:     General: No focal deficit present.  Mental Status: He is alert and oriented to person, place, and time. Mental status is at baseline.     Cranial Nerves: No cranial nerve deficit.     Sensory: No sensory deficit.     Motor: No weakness.     Coordination: Coordination normal.     Gait: Gait normal.     Deep Tendon Reflexes: Reflexes normal.           Assessment & Plan:  Ataxia  Dizziness on standing  Multiple falls  Patient's physical exam is consistent with benign paroxysmal positional vertigo.  Spent more than 25 minutes today with the patient performing his history and physical exam and explaining the natural history of his condition.  Recommended tincture of time.  He can use meclizine 25 mg every 8 hours as needed for vertigo but I explained to the patient that this medication can make him drowsy and increase his risk of falls.  Therefore I asked him to be very judicious in taking this and be careful when he takes it.  Reassess in 1 week if symptoms persist or sooner if symptoms are changing.

## 2018-12-19 ENCOUNTER — Ambulatory Visit (HOSPITAL_COMMUNITY): Payer: Medicare Other

## 2018-12-21 ENCOUNTER — Ambulatory Visit (HOSPITAL_COMMUNITY): Payer: Medicare Other

## 2018-12-22 ENCOUNTER — Ambulatory Visit: Payer: Medicare Other | Admitting: Internal Medicine

## 2018-12-26 ENCOUNTER — Ambulatory Visit (HOSPITAL_COMMUNITY): Payer: Medicare Other

## 2018-12-28 ENCOUNTER — Ambulatory Visit (HOSPITAL_COMMUNITY): Payer: Medicare Other

## 2019-01-02 ENCOUNTER — Ambulatory Visit (HOSPITAL_COMMUNITY): Payer: Medicare Other

## 2019-01-04 ENCOUNTER — Ambulatory Visit (HOSPITAL_COMMUNITY): Payer: Medicare Other

## 2019-01-09 ENCOUNTER — Ambulatory Visit (HOSPITAL_COMMUNITY): Payer: Medicare Other

## 2019-01-10 ENCOUNTER — Other Ambulatory Visit: Payer: Self-pay | Admitting: Family Medicine

## 2019-01-11 ENCOUNTER — Ambulatory Visit (HOSPITAL_COMMUNITY): Payer: Medicare Other

## 2019-01-16 ENCOUNTER — Ambulatory Visit (HOSPITAL_COMMUNITY): Payer: Medicare Other

## 2019-01-18 ENCOUNTER — Ambulatory Visit (HOSPITAL_COMMUNITY): Payer: Medicare Other

## 2019-01-20 ENCOUNTER — Other Ambulatory Visit: Payer: Self-pay | Admitting: Family Medicine

## 2019-01-23 ENCOUNTER — Ambulatory Visit (HOSPITAL_COMMUNITY): Payer: Medicare Other

## 2019-01-25 ENCOUNTER — Ambulatory Visit (HOSPITAL_COMMUNITY): Payer: Medicare Other

## 2019-01-25 ENCOUNTER — Other Ambulatory Visit: Payer: Self-pay | Admitting: Family Medicine

## 2019-01-30 ENCOUNTER — Ambulatory Visit (HOSPITAL_COMMUNITY): Payer: Medicare Other

## 2019-02-01 ENCOUNTER — Ambulatory Visit (HOSPITAL_COMMUNITY): Payer: Medicare Other

## 2019-02-02 ENCOUNTER — Encounter: Payer: Self-pay | Admitting: Family Medicine

## 2019-02-02 ENCOUNTER — Ambulatory Visit: Payer: Medicare Other | Admitting: Family Medicine

## 2019-02-02 ENCOUNTER — Other Ambulatory Visit: Payer: Self-pay

## 2019-02-02 VITALS — BP 130/80 | HR 88 | Temp 98.3°F | Resp 20 | Ht 66.5 in | Wt 123.6 lb

## 2019-02-02 DIAGNOSIS — J441 Chronic obstructive pulmonary disease with (acute) exacerbation: Secondary | ICD-10-CM | POA: Diagnosis not present

## 2019-02-02 MED ORDER — AMOXICILLIN-POT CLAVULANATE ER 1000-62.5 MG PO TB12: 2.0000 | ORAL_TABLET | Freq: Two times a day (BID) | ORAL | 0 refills | Status: DC

## 2019-02-02 MED ORDER — AZITHROMYCIN 250 MG PO TABS: ORAL_TABLET | ORAL | 0 refills | Status: DC

## 2019-02-02 MED ORDER — PREDNISONE 20 MG PO TABS: 40.0000 mg | ORAL_TABLET | Freq: Every day | ORAL | 0 refills | Status: DC

## 2019-02-02 NOTE — Progress Notes (Signed)
Subjective:    Patient ID: Bruce Travis, male    DOB: 1934-10-13, 84 y.o.   MRN: [id number]  HPI  Patient was admitted at the end of February for bilateral bibasilar pneumonia and COPD exacerbation.  This required a stay in a rehab facility for almost a month afterwards to regain his strength.  Over the last 3 to 4 days he is reported increasing shortness of breath.  He is increased his albuterol use to 4 times a day with very little benefit.  He reports chest congestion.  Cough productive of yellow mucus, and increasing shortness of breath.  He denies any fever.  He denies any exposure to COVID-19.  He has been quarantined at home.  His wife is not ill.  He denies any hemoptysis.  He denies any chest pain.  He denies any leg swelling.  On examination today, the patient has diminished breath sounds in all 4 lung fields.  He also has faint bibasilar crackles.  He has poor air exchange and expiratory wheezing that is very faint due to his diminished breath sounds Past Medical History:  Diagnosis Date  . Allergic rhinitis   . Anxiety   . Bronchiectasis   . CAD (coronary artery disease)   . Celiac disease   . Chronic heart failure ([place])   . Chronic kidney disease (CKD), stage III (moderate) (HCC)   . COPD (chronic obstructive pulmonary disease) ([place])   . Diverticulosis of colon (without mention of hemorrhage)   . Esophageal reflux   . Family history of adverse reaction to anesthesia    daughter gets PONV  . Family hx of colon cancer   . History of stomach ulcers 1980s  . Hyperlipemia   . Hypertension   . 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 Myocardial infarction [facility])    "previous MI/echo in 12/2015"  . Nephrolithiasis    "got them now; never had OR/scopes" (02/03/2016)  . On home oxygen therapy    "3L-5L; 24/7" (12/29/2017)  . Other diseases of lung, not elsewhere classified   . Personal history of colonic polyps 04/26/2007   hyperplastic   . Pneumonia "several  times"   Past Surgical History:  Procedure Laterality Date  . CARDIAC CATHETERIZATION  02/03/2016  . CARDIAC CATHETERIZATION  09/30/2009   non-obstructive CAD w/30% narrowing in prox LAD (Dr. Waunita Schooner)  . CARDIAC CATHETERIZATION  05/04/2001   same at 2011 cath (Dr. Waunita Schooner)  . CARDIAC CATHETERIZATION N/A 02/03/2016   Procedure: Right/Left Heart Cath and Coronary Angiography;  Surgeon: Pixie Casino, MD;  Location: [facility];  Service: Cardiovascular;  Laterality: N/A;  . EPIGLOTOPLASTY W/ MLB     removal due to carcinoma  . EXCISIONAL HEMORRHOIDECTOMY  2000s  . HAND SURGERY Right 1958   d/t crush injury  . TONSILLECTOMY AND ADENOIDECTOMY    . ULTRASOUND GUIDANCE FOR VASCULAR ACCESS  02/03/2016   Procedure: Ultrasound Guidance For Vascular Access;  Surgeon: Pixie Casino, MD;  Location: [facility];  Service: Cardiovascular;;  . VASECTOMY     Current Outpatient Medications on File Prior to Visit  Medication Sig Dispense Refill  . acetaminophen (TYLENOL) 325 MG tablet Take 2 tablets (650 mg total) by mouth every 6 (six) hours as needed for fever.    Marland Kitchen albuterol (PROVENTIL HFA;VENTOLIN HFA) 108 (90 Base) MCG/ACT inhaler Inhale 2 puffs into the lungs every 4 (four) hours as needed for wheezing or shortness of  breath. 1 Inhaler 0  . albuterol (PROVENTIL) (2.5 MG/3ML) 0.083% nebulizer solution Take 3 mLs (2.5 mg total) by nebulization every 4 (four) hours as needed for wheezing or shortness of breath. 360 mL 0  . ALPRAZolam (XANAX) 0.25 MG tablet Take 1 tablet (0.25 mg total) by mouth 2 (two) times daily as needed for anxiety. 30 tablet 2  . amiodarone (PACERONE) 200 MG tablet TAKE 1/2 TABLET BY MOUTH ONCE DAILY. 45 tablet 0  . aspirin EC 81 MG tablet Take 81 mg by mouth daily.    Marland Kitchen atorvastatin (LIPITOR) 20 MG tablet TAKE ONE TABLET BY MOUTH ONCE DAILY. 90 tablet 0  . Azelastine HCl 137 MCG/SPRAY SOLN Place 2 sprays into both nostrils 2 (two) times daily. 1 Bottle 0  .  calcitonin, salmon, (MIACALCIN/FORTICAL) 200 UNIT/ACT nasal spray Place 1 spray into alternate nostrils daily. Every other day 3.7 mL 0  . Calcium Carbonate-Vitamin D (CALTRATE 600+D) 600-400 MG-UNIT tablet Take 1 tablet by mouth daily.     Marland Kitchen dextromethorphan-guaiFENesin (MUCINEX DM) 30-600 MG 12hr tablet Take 1 tablet by mouth 2 (two) times daily.    Marland Kitchen ENTRESTO 49-51 MG TAKE (1) TABLET BY MOUTH TWICE DAILY. 180 tablet 0  . HYDROcodone-homatropine (HYCODAN) 5-1.5 MG/5ML syrup Take 5 mLs by mouth every 6 (six) hours as needed for cough. 90 mL 0  . LUTEIN PO Take 1 capsule by mouth daily.    . meclizine (ANTIVERT) 25 MG tablet Take 1 tablet (25 mg total) by mouth 3 (three) times daily as needed for dizziness. 30 tablet 0  . montelukast (SINGULAIR) 10 MG tablet TAKE 1 TABLET BY MOUTH AT BEDTIME. 30 tablet 0  . Multiple Vitamins-Minerals (CENTRUM SILVER 50+MEN) TABS Take 1 tablet by mouth daily.    . NON FORMULARY Diet Type:  Gluten Free    . OXYGEN Inhale 4-6 L/min into the lungs continuous. To maintain sats > 88%    . pantoprazole (PROTONIX) 40 MG tablet Take 1 tablet (40 mg total) by mouth daily. 30 tablet 0  . polyethylene glycol (MIRALAX / GLYCOLAX) packet Take 17 g by mouth daily as needed for mild constipation. Mix in 8 oz liquid and drink    . predniSONE (DELTASONE) 5 MG tablet TAKE 1 TABLET BY MOUTH DAILY WITH BREAKFAST. 30 tablet 3  . spironolactone (ALDACTONE) 25 MG tablet TAKE (1/2) TABLET BY MOUTH DAILY. 45 tablet 0  . SYMBICORT 160-4.5 MCG/ACT inhaler INHALE 2 PUFFS INTO THE LUNGS EVERY 12 HOURS. 10.2 g 11  . tamsulosin (FLOMAX) 0.4 MG CAPS capsule TAKE ONE CAPSULE BY MOUTH ONCE DAILY. 30 capsule 0  . Tiotropium Bromide Monohydrate (SPIRIVA RESPIMAT) 2.5 MCG/ACT AERS Inhale 2 puffs into the lungs 2 (two) times daily. 1 Inhaler 0  . UNABLE TO FIND Apply Xerofoam gauze to Right forearm and cover with a dry dressing.  Check daily and change every 3 days and as needed     No current  facility-administered medications on file prior to visit.    Allergies  Allergen Reactions  . Codeine Other (See Comments)    Thought head was going to blow off/ severe headache  . Gluten Meal Other (See Comments)    Celiac disease  . Adhesive [Tape] Other (See Comments)    Band-aids - tears skin off   Social History   Socioeconomic History  . Marital status: Married    Spouse name: Not on file  . Number of children: 3  . Years of education: Not on file  .  Highest education level: Not on file  Occupational History  . 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 Financial resource strain: Not on file  . Food insecurity    Worry: Not on file    Inability: Not on file  . Transportation needs    Medical: Not on file    Non-medical: Not on file  Tobacco Use  . Smoking status: Former Smoker    Packs/day: 2.00    Years: 35.00    Pack years: 70.00    Types: Cigarettes    Quit date: 01/21/1989    Years since quitting: 30.0  . Smokeless tobacco: Former Systems developer    Types: [place] date: 08/23/2001  Substance and Sexual Activity  . Alcohol use: No  . Drug use: No  . Sexual activity: Not on file  Lifestyle  . Physical activity    Days per week: Not on file    Minutes per session: Not on file  . Stress: Not on file  Relationships  . Social Herbalist on phone: Not on file    Gets together: Not on file    Attends religious service: Not on file    Active member of club or organization: Not on file    Attends meetings of clubs or organizations: Not on file    Relationship status: Not on file  . Intimate partner violence    Fear of current or ex partner: Not on file    Emotionally abused: Not on file    Physically abused: Not on file    Forced sexual activity: Not on file  Other Topics Concern  . Not on file  Social History Narrative  . Not on file      Review of Systems  All other systems reviewed and are  negative.      Objective:   Physical Exam  HENT:  Right Ear: Tympanic membrane and ear canal normal.  Left Ear: Tympanic membrane and ear canal normal.  Nose: No mucosal edema or rhinorrhea.  Cardiovascular: Normal rate, regular rhythm and normal heart sounds.  No murmur heard. Pulmonary/Chest: Effort normal. No respiratory distress. He has decreased breath sounds in the right upper field, the right lower field, the left upper field and the left lower field. He has wheezes in the right upper field, the right lower field, the left upper field and the left lower field. He has rhonchi in the right upper field, the right lower field, the left upper field and the left lower field. He has rales. He exhibits no tenderness.  Abdominal: Soft. Bowel sounds are normal.  Musculoskeletal:        General: No edema.  Vitals reviewed.        Assessment & Plan:   1. COPD with acute exacerbation [facility]) Patient is having a COPD exacerbation and possible early community-acquired pneumonia.  I will treat the patient aggressively given his track record.  Begin prednisone 40 mg a day and continue albuterol every 6 hours via nebulizer therapy.  In addition begin Augmentin 2000 mg twice daily coupled with a Z-Pak for community-acquired pneumonia based on the bibasilar crackles I can appreciate on his pulmonary exam.  Reassess next week or seek medical attention immediately if worsening

## 2019-02-05 ENCOUNTER — Ambulatory Visit: Payer: Medicare Other | Admitting: Internal Medicine

## 2019-02-06 ENCOUNTER — Ambulatory Visit (HOSPITAL_COMMUNITY): Payer: Medicare Other

## 2019-02-07 ENCOUNTER — Other Ambulatory Visit: Payer: Self-pay | Admitting: Adult Health

## 2019-02-08 ENCOUNTER — Ambulatory Visit (HOSPITAL_COMMUNITY): Payer: Medicare Other

## 2019-02-08 ENCOUNTER — Telehealth: Payer: Self-pay | Admitting: Family Medicine

## 2019-02-08 ENCOUNTER — Other Ambulatory Visit: Payer: Self-pay | Admitting: Family Medicine

## 2019-02-08 MED ORDER — PANTOPRAZOLE SODIUM 40 MG PO TBEC: 40.0000 mg | DELAYED_RELEASE_TABLET | Freq: Every day | ORAL | 3 refills | Status: DC

## 2019-02-08 NOTE — Telephone Encounter (Signed)
Medication called/sent to requested pharmacy  

## 2019-02-08 NOTE — Telephone Encounter (Signed)
CB# [phone]  Patient called requesting a refill on his protonix he is going out of town on Monday and would like to pick these up prior to leaving. Please call into [place].

## 2019-02-13 ENCOUNTER — Ambulatory Visit (HOSPITAL_COMMUNITY): Payer: Medicare Other

## 2019-02-15 ENCOUNTER — Ambulatory Visit (HOSPITAL_COMMUNITY): Payer: Medicare Other

## 2019-02-20 ENCOUNTER — Ambulatory Visit (HOSPITAL_COMMUNITY): Payer: Medicare Other

## 2019-02-21 ENCOUNTER — Telehealth: Payer: Self-pay | Admitting: Family Medicine

## 2019-02-21 MED ORDER — HYDROCODONE-ACETAMINOPHEN 5-325 MG PO TABS: 1.0000 | ORAL_TABLET | Freq: Four times a day (QID) | ORAL | 0 refills | Status: AC | PRN

## 2019-02-21 NOTE — Telephone Encounter (Signed)
Pt went and saw an optometrist for a rash on his face/in his eye he is at [facility] and they dx him with shingles.  He is in severe pain he has been using tylenol but it is not helping. He has used hydrocodone in the past but he left his script at home when they left.   [facility]

## 2019-02-21 NOTE — Telephone Encounter (Signed)
OK to send in RX of pain med - (I set up rx for hydrocodone and to correct pharm - if not ok will inform pt)

## 2019-02-22 ENCOUNTER — Ambulatory Visit (HOSPITAL_COMMUNITY): Payer: Medicare Other

## 2019-02-27 ENCOUNTER — Ambulatory Visit (HOSPITAL_COMMUNITY): Payer: Medicare Other

## 2019-02-27 ENCOUNTER — Other Ambulatory Visit: Payer: Self-pay | Admitting: Family Medicine

## 2019-02-28 ENCOUNTER — Ambulatory Visit: Payer: Medicare Other | Admitting: Internal Medicine

## 2019-03-01 ENCOUNTER — Other Ambulatory Visit (HOSPITAL_COMMUNITY): Payer: Self-pay | Admitting: Internal Medicine

## 2019-03-01 ENCOUNTER — Ambulatory Visit (HOSPITAL_COMMUNITY): Payer: Medicare Other

## 2019-03-06 ENCOUNTER — Other Ambulatory Visit: Payer: Self-pay

## 2019-03-06 ENCOUNTER — Ambulatory Visit: Payer: Medicare Other | Admitting: Internal Medicine

## 2019-03-06 ENCOUNTER — Ambulatory Visit (HOSPITAL_COMMUNITY): Payer: Medicare Other

## 2019-03-06 ENCOUNTER — Encounter: Payer: Self-pay | Admitting: Internal Medicine

## 2019-03-06 ENCOUNTER — Ambulatory Visit (INDEPENDENT_AMBULATORY_CARE_PROVIDER_SITE_OTHER): Payer: Medicare Other

## 2019-03-06 DIAGNOSIS — J449 Chronic obstructive pulmonary disease, unspecified: Secondary | ICD-10-CM

## 2019-03-06 DIAGNOSIS — J9611 Chronic respiratory failure with hypoxia: Secondary | ICD-10-CM | POA: Diagnosis not present

## 2019-03-06 DIAGNOSIS — R918 Other nonspecific abnormal finding of lung field: Secondary | ICD-10-CM

## 2019-03-06 NOTE — Patient Instructions (Signed)
Keep up the walking to the road but adjust the 02 to what you need to keep your saturations above 90% and call me if there is a definite downward trend doing the same activity   Please remember to go to the  x-ray department  for your tests - we will call you with the results when they are available     Please schedule a follow up visit in 3 months but call sooner if needed

## 2019-03-06 NOTE — Progress Notes (Signed)
Subjective:    Patient ID: Bruce Travis, male    DOB: 09-16-1934    MRN: [id number]  Brief patient profile:   65  yowm son is MD/ quit smoking 1990 with GOLD II  COPD,  MPNs,  remote laryngeal cancer (? early 1990s)  with some dysphagia, allergic rhinitis, and esophageal reflux.    History of Present Illness  11/08/2011 Bruce Travis/ f/u ov on maint rx with symbicort and spiriva c/o nasal symptoms worse but  breathing fine, no limits as long as paces himself and no cough.  Main nasal issue is congestion/itching/sneezing worse x 2 weeks with purulent bloody secretions s HA/toothache  But ahs noted  year round nasal symptoms and worse x 2 years which do not correlate with breathing status Stopped nasonex due to insurance > no worse off it> has not f/u with [name] yet for ent re-eval No daytime saba need rec Augmentin 875 mg twice daily x 10days Prednisone 10 mg take  4 each am x 2 days,   2 each am x 2 days,  1 each am x2days and stop See Dr [name] in 2 weeks to let him direct you further on what to do about your chronic sinus symptoms      03/06/2013 f/u ov/Bruce Travis re GOLD II COPD/ chronic hoarse p surg ([facility])/ maint symb/ spiriva Chief Complaint  Patient presents with  . Follow-up    Pt states breathing is the same since last visit, no better or worse. Denies any new co's today.    main issue is yardwork in heat, bettter ex tol p uses saba, hfa good techniqe, never uses neb  rec Only use your albuterol (proaire) as a rescue medication Please see patient coordinator before you leave today  to schedule CT chest to compare to previous studies for your lung nodules> no change x 4 years so directed f/u indicated        04/12/2017  f/u ov/Bruce Travis re:  GOLD II copd / maint symb 160/spiriva dpi  Chief Complaint  Patient presents with  . Follow-up    Breathing is much improved since the last visit. He is attending pulmonary rehab 2 x per wk. He is using proair once per wk on average.   On 3lpm most the  time/ 6lpm with exertion like at rehab, doing about the same vs a year prior to OV   Back to doing some walking at [facility] behind a cart with 02 in the cart  Using proair first thing in am ? Why as supposed to use just prn  No worse sob off prednisone  rec Try spiriva respimat 2 pffs each am right behind the symbicort         Admit date: 12/29/2017 Discharge date: 12/31/2017  Following problems were addressed during his hospitalization:  Acute on chronic respiratory failure with hypoxia: Secondary to COPD and pneumonia. Continue supplemental oxygen at home, bronchodilators, steroids, antibiotics.  Community-acquired pneumonia:Chest x-ray showed bilateral pneumonia.Patient failed outpatient oral antibiotic. Started on azithromycin and ceftriaxone here.  Antibiotics changed to oral on discharge Negative  blood cultures, sputum cultures so far.Urine Streptococcalantigen negative.  COPD: Follows with pulmonology as an outpatient. On nebulization machine, bronchodilators at home.On 3 L oxygen at rest.  GERD: Continue Protonix  Hypertension: Currently blood pressure stable.Continue home meds.  Coronary artery disease with ischemic cardiomyopathy: Currently stable. No chest pain. Continue aspirin, Entresto, amiodarone  Chronic kidney disease stage III: Currently creatinine on baseline  History of TMH:[id number] Flomax  Lactic acidosis:  Improved.  Diastolic CHF: 2D echo on 39/76 showed EF 55 to 60%.Currently he is euvolemic. No peripheral edema. Continue Entresto. Spironolactone on hold. Not on beta-blocker therapy due to severe COPD.  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 Follows with cardiology   Discharge Diagnoses:  Principal Problem:   Acute respiratory failure with hypoxia ([place]) Active Problems:   GERD   COPD exacerbation ([place])   Hypertension   Community acquired pneumonia of left lower lobe of lung ([place])   CAD (coronary artery  disease)   Chronic kidney disease (CKD), stage III (moderate) ([place])     02/17/2018  Ext post hosp f/u ov/Bruce Travis re:  Copd GOLD II/ 02 dep transition of care s/p above admit  Chief Complaint  Patient presents with  . Follow-up    Breathing is unchanged. He rarely uses albuterol inhaler or neb. He sta  Dyspnea:  On 3-4lpm can do any store = [employer] = can't walk a nl pace on a flat grade s sob but does fine slow and flat  Cough: min sporadic white mucus SABA use: rarely hfa/ no neb   Sleeping: lying flat on 3lpm ok  rec F/u cxr  > cleared     03/06/2019  f/u ov/Bruce Travis re:  COPD GOLD II criteria but 02 dep 3lpm at rest and up to 6 lpm walk maint on symb/spiriva Chief Complaint  Patient presents with  . Follow-up    Patient reports that his breathing has been doing good recently. He reports some days are bad and some are not. He reports very seldom using his rescue inhaler.   Dyspnea:  Able to do houswork, dishes, outside driveway out to road about 1000 ft at 5-6 lpm with sats lower 80's but device goes as high as 8 and does not titrate Cough: minimal rattling min mucus white does not wake him up  Sleeping: bed flat, a couple of pillow SABA use: rarely saba hfa / never neb  02: as above    No obvious day to day or daytime variability or assoc excess/ purulent sputum or mucus plugs or hemoptysis or cp or chest tightness, subjective wheeze or overt sinus or hb symptoms.   Sleeping as above without nocturnal exacerbation  of respiratory  c/o's or need for noct saba. Also denies any obvious fluctuation of symptoms with weather or environmental changes or other aggravating or alleviating factors except as outlined above   No unusual exposure hx or h/o childhood pna/ asthma or knowledge of premature birth.  Current Allergies, Complete Past Medical History, Past Surgical History, Family History, and Social History were reviewed in [employer] record.  ROS  The following are  not active complaints unless bolded Hoarseness, sore throat, dysphagia, dental problems, itching, sneezing,  nasal congestion or discharge of excess mucus or purulent secretions, ear ache,   fever, chills, sweats, unintended wt loss or wt gain, classically pleuritic or exertional cp,  orthopnea pnd or arm/hand swelling  or leg swelling, presyncope, palpitations, abdominal pain, anorexia, nausea, vomiting, diarrhea  or change in bowel habits or change in bladder habits, change in stools or change in urine, dysuria, hematuria,  rash, arthralgias, visual complaints, headache, numbness, weakness or ataxia or problems with walking or coordination,  change in mood or  memory.        Current Meds  Medication Sig  . acetaminophen (TYLENOL) 325 MG tablet Take 2 tablets (650 mg total) by mouth every 6 (six) hours as needed for fever.  Marland Kitchen albuterol (PROVENTIL HFA;VENTOLIN HFA)  108 (90 Base) MCG/ACT inhaler Inhale 2 puffs into the lungs every 4 (four) hours as needed for wheezing or shortness of breath.  Marland Kitchen albuterol (PROVENTIL) (2.5 MG/3ML) 0.083% nebulizer solution Take 3 mLs (2.5 mg total) by nebulization every 4 (four) hours as needed for wheezing or shortness of breath.  . ALPRAZolam (XANAX) 0.25 MG tablet Take 1 tablet (0.25 mg total) by mouth 2 (two) times daily as needed for anxiety.  Marland Kitchen amiodarone (PACERONE) 200 MG tablet TAKE 1/2 TABLET BY MOUTH ONCE DAILY.  Marland Kitchen aspirin EC 81 MG tablet Take 81 mg by mouth daily.  Marland Kitchen atorvastatin (LIPITOR) 20 MG tablet TAKE ONE TABLET BY MOUTH ONCE DAILY.  Marland Kitchen azelastine (ASTELIN) 0.1 % nasal spray USE 2 SPRAYS IN EACH NOSTRILS TWICE DAILY AS DIRECTED.  Marland Kitchen calcitonin, salmon, (MIACALCIN/FORTICAL) 200 UNIT/ACT nasal spray Place 1 spray into alternate nostrils daily. Every other day  . Calcium Carbonate-Vitamin D (CALTRATE 600+D) 600-400 MG-UNIT tablet Take 1 tablet by mouth daily.   Marland Kitchen dextromethorphan-guaiFENesin (MUCINEX DM) 30-600 MG 12hr tablet Take 1 tablet by mouth 2 (two)  times daily.  Marland Kitchen ENTRESTO 49-51 MG TAKE (1) TABLET BY MOUTH TWICE DAILY.  Marland Kitchen HYDROcodone-acetaminophen (NORCO/VICODIN) 5-325 MG tablet Take 1 tablet by mouth every 6 (six) hours as needed for moderate pain.  Marland Kitchen HYDROcodone-homatropine (HYCODAN) 5-1.5 MG/5ML syrup Take 5 mLs by mouth every 6 (six) hours as needed for cough.  . LUTEIN PO Take 1 capsule by mouth daily.  . meclizine (ANTIVERT) 25 MG tablet Take 1 tablet (25 mg total) by mouth 3 (three) times daily as needed for dizziness.  . montelukast (SINGULAIR) 10 MG tablet TAKE 1 TABLET BY MOUTH AT BEDTIME.  . Multiple Vitamins-Minerals (CENTRUM SILVER 50+MEN) TABS Take 1 tablet by mouth daily.  . NON FORMULARY Diet Type:  Gluten Free  . OXYGEN Inhale 4-6 L/min into the lungs continuous. To maintain sats > 88%  . pantoprazole (PROTONIX) 40 MG tablet TAKE ONE TABLET BY MOUTH DAILY.  . pantoprazole (PROTONIX) 40 MG tablet Take 1 tablet (40 mg total) by mouth daily.  . polyethylene glycol (MIRALAX / GLYCOLAX) packet Take 17 g by mouth daily as needed for mild constipation. Mix in 8 oz liquid and drink  . predniSONE (DELTASONE) 5 MG tablet TAKE 1 TABLET BY MOUTH DAILY WITH BREAKFAST.  Marland Kitchen spironolactone (ALDACTONE) 25 MG tablet TAKE (1/2) TABLET BY MOUTH DAILY.  . SYMBICORT 160-4.5 MCG/ACT inhaler INHALE 2 PUFFS INTO THE LUNGS EVERY 12 HOURS.  . tamsulosin (FLOMAX) 0.4 MG CAPS capsule TAKE ONE CAPSULE BY MOUTH ONCE DAILY.  Marland Kitchen Tiotropium Bromide Monohydrate (SPIRIVA RESPIMAT) 2.5 MCG/ACT AERS Inhale 2 puffs into the lungs 2 (two) times daily.  Marland Kitchen UNABLE TO FIND Apply Xerofoam gauze to Right forearm and cover with a dry dressing.  Check daily and change every 3 days and as needed                 Objective:   Physical Exam      Wt  140 04/08/2011 > 06/08/2011 141 >136 09/09/2011 > 141 11/08/2011 > 03/07/2012  139 > 09/07/2012  141 > 136 03/06/2013 > 03/01/2017   128 >  04/12/2017  125 > 07/11/2017   128 > 11/15/2017  129 > 02/17/2018   126 > 06/21/2018  132  >  03/06/2019   124         Hoarse pleasant amb wm nad   HEENT: nl  oropharynx with  Edentulous  Nl external ear canals without  cough reflex -  Mild bilateral non-specific turbinate edema     NECK :  without JVD/Nodes/TM/ nl carotid upstrokes bilaterally   LUNGS: no acc muscle use,  Mod barrel  contour chest wall with bilateral  Distant bs s audible wheeze and  without cough on insp or exp maneuver and mod  Hyperresonant  to  percussion bilaterally     CV:  RRR  no s3 or murmur or increase in P2, and no edema   ABD:  soft and nontender with pos mid insp Hoover's  in the supine position. No bruits or organomegaly appreciated, bowel sounds nl  MS:   Nl gait/  ext warm without deformities, calf tenderness, cyanosis or clubbing No obvious joint restrictions   SKIN: warm and dry without lesions    NEURO:  alert, approp, nl sensorium with  no motor or cerebellar deficits apparent                    CXR PA and Lateral:   03/06/2019 :    I personally reviewed images and agree with radiology impression as follows:   Extensive areas of scarring and architectural change may limit detection of underlying acute parenchymal processes. No new focal consolidation is definitively seen.      Assessment & Plan:

## 2019-03-07 ENCOUNTER — Encounter: Payer: Self-pay | Admitting: Internal Medicine

## 2019-03-07 NOTE — Assessment & Plan Note (Signed)
Quit smoking 1990   - PFT's 06/08/11  FEV1  1.71 (67%) ratio 37 and no better p B2 with DLC0 54% -  PFT's  04/12/2017  FEV1 1.62  (65 % ) ratio 43  p 6 % improvement from saba p spiriva  prior to study with DLCO  24/23 % corrects to 27  % for alv volume   - 04/12/2017  After extensive coaching device effectiveness =    75% so changed to spiriva smi     - 11/15/2017  After extensive coaching inhaler device  effectiveness =    90%    Group D in terms of symptom/risk and laba/lama/ICS  therefore appropriate rx at this point >>>  Continue symb/spiriva as relatively well compensated

## 2019-03-07 NOTE — Assessment & Plan Note (Signed)
CT 2010 copd only   >CT chest 03/12/2011 >>Severe centrilobular emphysema with areas of new and persistent  nodularity  A new somewhat spiculated opacity within the left upper lobe  measures 1.6 x 2.5 cm on transverse image 13 of series 6 > CT chest 03/07/2012 1. The vast majority of the previously noted pulmonary nodules are unchanged in size, number and distribution (and the overall appearance of the lungs could suggest sequelae of a pneumoconiosis or simply sequelae of old granulomatous disease), with exception of the right lower lobe pulmonary nodules as mentioned above. The largest of these new nodules is 7 mm and has spiculated margins and some surrounding architectural distortion.  - Repeat ct rec 03/06/2013 > No changes so no dedicated f/u  -  Chest CT w/o contrast 02/22/17 Bilateral lower lobe bronchiectasis with bilateral lower lobe tree-in-bud opacities compatible with infection. Mucous fills multiple bilateral lower lobe bronchi. 4 x 3.5 x 2 cm confluent peripheral opacity within the posterior left lower lobe is suggestive of atelectasis. > cleared on cxr 07/11/2017   - cxr 03/06/2019 no serial changes   This is an extremely common benign condition in the elderly and does not warrant aggressive eval/ rx at this point unless there is a clinical correlation suggesting unaddressed pulmonary infection (purulent sputum, night sweats, unintended wt loss, doe) or evolution of  obvious changes on plain cxr (as opposed to serial CT, which is way over sensitive to make clinical decisions re intervention and treatment in the elderly, who tend to tolerate both dx and treatment poorly) .   cxr s change, clinically doing the same so no further w/u needed at this point.  Discussed in detail all the  indications, usual  risks and alternatives  relative to the benefits with patient who agrees to proceed with conservative f/u as outlined

## 2019-03-07 NOTE — Assessment & Plan Note (Signed)
Advised to titrate 02 with activity to keep sats > 90% and track progress  Also cautioned to look for a change with symptoms / sats due to continued use of amiodarone in setting of advanced age and severe underlying baseline lung dz.   I had an extended discussion with the patient reviewing all relevant studies completed to date and  lasting 15 to 20 minutes of a 25 minute visit    Each maintenance medication was reviewed in detail including most importantly the difference between maintenance and prns and under what circumstances the prns are to be triggered using an action plan format that is not reflected in the computer generated alphabetically organized AVS.     Please see AVS for specific instructions unique to this visit that I personally wrote and verbalized to the the pt in detail and then reviewed with pt  by my nurse highlighting any  changes in therapy recommended at today's visit to their plan of care.

## 2019-03-08 ENCOUNTER — Ambulatory Visit (HOSPITAL_COMMUNITY): Payer: Medicare Other

## 2019-03-08 NOTE — Progress Notes (Signed)
Mychart msg sent

## 2019-03-09 ENCOUNTER — Other Ambulatory Visit (HOSPITAL_COMMUNITY): Payer: Self-pay | Admitting: Internal Medicine

## 2019-03-13 ENCOUNTER — Ambulatory Visit (HOSPITAL_COMMUNITY): Payer: Medicare Other

## 2019-03-15 ENCOUNTER — Ambulatory Visit (HOSPITAL_COMMUNITY): Payer: Medicare Other

## 2019-03-20 ENCOUNTER — Ambulatory Visit (HOSPITAL_COMMUNITY): Payer: Medicare Other

## 2019-03-22 ENCOUNTER — Ambulatory Visit (HOSPITAL_COMMUNITY): Payer: Medicare Other

## 2019-03-26 ENCOUNTER — Other Ambulatory Visit: Payer: Self-pay | Admitting: Adult Health

## 2019-03-26 ENCOUNTER — Other Ambulatory Visit (HOSPITAL_COMMUNITY): Payer: Self-pay | Admitting: Internal Medicine

## 2019-03-26 ENCOUNTER — Other Ambulatory Visit: Payer: Self-pay | Admitting: Family Medicine

## 2019-03-27 ENCOUNTER — Ambulatory Visit (HOSPITAL_COMMUNITY): Payer: Medicare Other

## 2019-03-29 ENCOUNTER — Ambulatory Visit (HOSPITAL_COMMUNITY): Payer: Medicare Other

## 2019-04-03 ENCOUNTER — Ambulatory Visit (HOSPITAL_COMMUNITY): Payer: Medicare Other

## 2019-04-05 ENCOUNTER — Ambulatory Visit (HOSPITAL_COMMUNITY): Payer: Medicare Other

## 2019-04-05 ENCOUNTER — Other Ambulatory Visit: Payer: Self-pay | Admitting: Family Medicine

## 2019-04-06 ENCOUNTER — Other Ambulatory Visit: Payer: Self-pay | Admitting: Family Medicine

## 2019-04-06 NOTE — Telephone Encounter (Signed)
Requested Prescriptions   Pending Prescriptions Disp Refills  . HYDROcodone-homatropine (HYCODAN) 5-1.5 MG/5ML syrup [Pharmacy Med Name: HYDROCODONE-HOMATROPINE SYRUP] 90 mL 0    Sig: TAKE 2.5ML-5 ML BY MOUTH EVERY 8 HOURS AS NEEDED FOR COUGH. DO NOT TAKE AT THE SAME TIME AS XANAX.     Last OV 02/02/2019  Last written 10/31/2018

## 2019-04-06 NOTE — Telephone Encounter (Signed)
Ok to refill 

## 2019-04-10 ENCOUNTER — Ambulatory Visit (HOSPITAL_COMMUNITY): Payer: Medicare Other

## 2019-04-11 ENCOUNTER — Other Ambulatory Visit: Payer: Self-pay | Admitting: Family Medicine

## 2019-04-12 ENCOUNTER — Ambulatory Visit (HOSPITAL_COMMUNITY): Payer: Medicare Other

## 2019-04-13 ENCOUNTER — Encounter (HOSPITAL_COMMUNITY): Payer: Medicare Other | Admitting: Internal Medicine

## 2019-04-17 ENCOUNTER — Telehealth: Payer: Self-pay | Admitting: Family Medicine

## 2019-04-17 ENCOUNTER — Ambulatory Visit (HOSPITAL_COMMUNITY): Payer: Medicare Other

## 2019-04-17 NOTE — Telephone Encounter (Signed)
Patient calling to discuss the flu vaccination with you  [phone]

## 2019-04-19 ENCOUNTER — Ambulatory Visit (HOSPITAL_COMMUNITY): Payer: Medicare Other

## 2019-04-24 ENCOUNTER — Ambulatory Visit (HOSPITAL_COMMUNITY): Payer: Medicare Other

## 2019-04-26 ENCOUNTER — Ambulatory Visit (HOSPITAL_COMMUNITY): Payer: Medicare Other

## 2019-04-26 ENCOUNTER — Other Ambulatory Visit: Payer: Self-pay

## 2019-04-26 ENCOUNTER — Encounter (HOSPITAL_COMMUNITY): Payer: Self-pay | Admitting: Internal Medicine

## 2019-04-26 ENCOUNTER — Ambulatory Visit (HOSPITAL_COMMUNITY)
Admission: RE | Admit: 2019-04-26 | Discharge: 2019-04-26 | Disposition: A | Discharge status: Home / Self Care | Payer: Medicare Other | Source: Ambulatory Visit | Attending: Internal Medicine | Admitting: Internal Medicine

## 2019-04-26 VITALS — BP 115/54 | HR 75 | Wt 124.8 lb

## 2019-04-26 DIAGNOSIS — J9610 Chronic respiratory failure, unspecified whether with hypoxia or hypercapnia: Secondary | ICD-10-CM | POA: Diagnosis not present

## 2019-04-26 DIAGNOSIS — I2510 Atherosclerotic heart disease of native coronary artery without angina pectoris: Secondary | ICD-10-CM | POA: Diagnosis not present

## 2019-04-26 DIAGNOSIS — I429 Cardiomyopathy, unspecified: Secondary | ICD-10-CM | POA: Insufficient documentation

## 2019-04-26 DIAGNOSIS — I5022 Chronic systolic (congestive) heart failure: Secondary | ICD-10-CM | POA: Diagnosis not present

## 2019-04-26 DIAGNOSIS — Z7982 Long term (current) use of aspirin: Secondary | ICD-10-CM | POA: Insufficient documentation

## 2019-04-26 DIAGNOSIS — I130 Hypertensive heart and chronic kidney disease with heart failure and stage 1 through stage 4 chronic kidney disease, or unspecified chronic kidney disease: Secondary | ICD-10-CM | POA: Insufficient documentation

## 2019-04-26 DIAGNOSIS — N183 Chronic kidney disease, stage 3 (moderate): Secondary | ICD-10-CM | POA: Diagnosis not present

## 2019-04-26 DIAGNOSIS — J441 Chronic obstructive pulmonary disease with (acute) exacerbation: Secondary | ICD-10-CM | POA: Insufficient documentation

## 2019-04-26 DIAGNOSIS — K900 Celiac disease: Secondary | ICD-10-CM | POA: Insufficient documentation

## 2019-04-26 DIAGNOSIS — Z79899 Other long term (current) drug therapy: Secondary | ICD-10-CM | POA: Diagnosis not present

## 2019-04-26 DIAGNOSIS — R42 Dizziness and giddiness: Secondary | ICD-10-CM | POA: Diagnosis not present

## 2019-04-26 DIAGNOSIS — Z8249 Family history of ischemic heart disease and other diseases of the circulatory system: Secondary | ICD-10-CM | POA: Insufficient documentation

## 2019-04-26 DIAGNOSIS — I252 Old myocardial infarction: Secondary | ICD-10-CM | POA: Diagnosis not present

## 2019-04-26 DIAGNOSIS — J440 Chronic obstructive pulmonary disease with acute lower respiratory infection: Secondary | ICD-10-CM | POA: Diagnosis not present

## 2019-04-26 DIAGNOSIS — E785 Hyperlipidemia, unspecified: Secondary | ICD-10-CM | POA: Diagnosis not present

## 2019-04-26 DIAGNOSIS — I4510 Unspecified right bundle-branch block: Secondary | ICD-10-CM | POA: Insufficient documentation

## 2019-04-26 DIAGNOSIS — F419 Anxiety disorder, unspecified: Secondary | ICD-10-CM | POA: Insufficient documentation

## 2019-04-26 DIAGNOSIS — Z87891 Personal history of nicotine dependence: Secondary | ICD-10-CM | POA: Diagnosis not present

## 2019-04-26 DIAGNOSIS — K219 Gastro-esophageal reflux disease without esophagitis: Secondary | ICD-10-CM | POA: Insufficient documentation

## 2019-04-26 DIAGNOSIS — I493 Ventricular premature depolarization: Secondary | ICD-10-CM | POA: Insufficient documentation

## 2019-04-26 DIAGNOSIS — Z9981 Dependence on supplemental oxygen: Secondary | ICD-10-CM | POA: Diagnosis not present

## 2019-04-26 NOTE — Addendum Note (Signed)
Encounter addended by: Marlise Eves, RN on: 04/26/2019 2:26 PM  Actions taken: Order list changed, Diagnosis association updated, Clinical Note Signed

## 2019-04-26 NOTE — Patient Instructions (Signed)
Your physician has requested that you have an echocardiogram. Echocardiography is a painless test that uses sound waves to create images of your heart. It provides your doctor with information about the size and shape of your heart and how well your heart's chambers and valves are working. This procedure takes approximately one hour. There are no restrictions for this procedure. This will be done at your follow up appointment.  Please follow up with the [facility] in 6 months.  At the [facility], you and your health needs are our priority. As part of our continuing mission to provide you with exceptional heart care, we have created designated Provider Care Teams. These Care Teams include your primary Cardiologist (physician) and Advanced Practice Providers (APPs- Physician Assistants and Nurse Practitioners) who all work together to provide you with the care you need, when you need it.   You may see any of the following providers on your designated Care Team at your next follow up: Marland Kitchen Dr Glori Bickers . Dr Loralie Champagne . Darrick Grinder, NP   Please be sure to bring in all your medications bottles to every appointment.

## 2019-04-26 NOTE — Addendum Note (Signed)
Encounter addended by: Jolaine Artist, MD on: 04/26/2019 2:21 PM  Actions taken: Clinical Note Signed

## 2019-04-26 NOTE — Progress Notes (Addendum)
Patient ID: Bruce Travis, male   DOB: 1935-01-08, 84 y.o.   MRN: [id number]   ADVANCED HF CLINIC NOTE   Primary Care Physician: Susy Frizzle, MD  HPI:  Bruce Travis is a 84 y.o. male with COPD (O2 dependent). HTN, HL and systolic HF fleto to be related to frequent PVCs.   Admitted in June 2017 with acute HF. Echo with EF 25%. Cath twith essentially normal coronaries. EF 20-25%. Low filling pressures but CO 2.5/1.5. Started on milrinone. 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 Started on amiodarone. PVCs went from ~20/minute to 1-2/minute by time of discharge. Milrinone weaned off.   Echo 04/12/16 LVEF 50-55%, Trivial AI, Mild Bruce, Mild LAE, PA peak pressure 46 mm Hg - much improved from previous.   Admitted 12/18 with PNA.  Admitted 2/20 with COPD flare  Bruce Travis presents today for regular follow up. Remains on amio 100 daily. Being very careful avoiding COVID. Last week had vertigo and fell and hit his butt. Remains on O2 3-4L. Can do ADLs without too much trouble. No CP or palpitations. Occasional wheezing    Echo 10/18 EF 50-55% Personally reviewed   ECG: NSR 84 with RBBB with PVC couplet Personally reviewed   PMHx:  Past Medical History:  Diagnosis Date  . Allergic rhinitis   . Anxiety   . Bronchiectasis   . CAD (coronary artery disease)   . Celiac disease   . Chronic heart failure ([place])   . Chronic kidney disease (CKD), stage III (moderate) (HCC)   . COPD (chronic obstructive pulmonary disease) ([place])   . Diverticulosis of colon (without mention of hemorrhage)   . Esophageal reflux   . Family history of adverse reaction to anesthesia    daughter gets PONV  . Family hx of colon cancer   . History of stomach ulcers 1980s  . Hyperlipemia   . Hypertension   . 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 Myocardial infarction [facility])    "previous MI/echo in 12/2015"  . Nephrolithiasis    "got them now; never had OR/scopes" (02/03/2016)  . On home oxygen  therapy    "3L-5L; 24/7" (12/29/2017)  . Other diseases of lung, not elsewhere classified   . Personal history of colonic polyps 04/26/2007   hyperplastic   . Pneumonia "several times"    Past Surgical History:  Procedure Laterality Date  . CARDIAC CATHETERIZATION  02/03/2016  . CARDIAC CATHETERIZATION  09/30/2009   non-obstructive CAD w/30% narrowing in prox LAD (Dr. Waunita Schooner)  . CARDIAC CATHETERIZATION  05/04/2001   same at 2011 cath (Dr. Waunita Schooner)  . CARDIAC CATHETERIZATION N/A 02/03/2016   Procedure: Right/Left Heart Cath and Coronary Angiography;  Surgeon: Pixie Casino, MD;  Location: [place] [facility];  Service: Cardiovascular;  Laterality: N/A;  . EPIGLOTOPLASTY W/ MLB     removal due to carcinoma  . EXCISIONAL HEMORRHOIDECTOMY  2000s  . HAND SURGERY Right 1958   d/t crush injury  . TONSILLECTOMY AND ADENOIDECTOMY    . ULTRASOUND GUIDANCE FOR VASCULAR ACCESS  02/03/2016   Procedure: Ultrasound Guidance For Vascular Access;  Surgeon: Pixie Casino, MD;  Location: [place] [facility];  Service: Cardiovascular;;  . VASECTOMY      FAMHx:  Family History  Problem Relation Age of Onset  . Heart disease Father   . Colon cancer Father   . Prostate cancer Father   . Stroke Mother   . Endometrial cancer Sister   .  Stroke Maternal Grandmother   . Cancer Paternal Grandmother     SOCHx:   reports that he quit smoking about 30 years ago. His smoking use included cigarettes. He has a 70.00 pack-year smoking history. He quit smokeless tobacco use about 17 years ago.  His smokeless tobacco use included chew. He reports that he does not drink alcohol or use drugs.  ALLERGIES:  Allergies  Allergen Reactions  . Codeine Other (See Comments)    Thought head was going to blow off/ severe headache  . Gluten Meal Other (See Comments)    Celiac disease  . Adhesive [Tape] Other (See Comments)    Band-aids - tears skin off    ROS: Pertinent items noted in HPI and remainder of  comprehensive ROS otherwise negative.  HOME MEDS: Current Outpatient Medications  Medication Sig Dispense Refill  . acetaminophen (TYLENOL) 325 MG tablet Take 2 tablets (650 mg total) by mouth every 6 (six) hours as needed for fever.    Marland Kitchen albuterol (PROVENTIL HFA;VENTOLIN HFA) 108 (90 Base) MCG/ACT inhaler Inhale 2 puffs into the lungs every 4 (four) hours as needed for wheezing or shortness of breath. 1 Inhaler 0  . albuterol (PROVENTIL) (2.5 MG/3ML) 0.083% nebulizer solution Take 3 mLs (2.5 mg total) by nebulization every 4 (four) hours as needed for wheezing or shortness of breath. 360 mL 0  . ALPRAZolam (XANAX) 0.25 MG tablet Take 1 tablet (0.25 mg total) by mouth 2 (two) times daily as needed for anxiety. 30 tablet 2  . amiodarone (PACERONE) 200 MG tablet TAKE 1/2 TABLET BY MOUTH ONCE DAILY. 45 tablet 0  . aspirin EC 81 MG tablet Take 81 mg by mouth daily.    Marland Kitchen atorvastatin (LIPITOR) 20 MG tablet TAKE ONE TABLET BY MOUTH ONCE DAILY. 90 tablet 0  . azelastine (ASTELIN) 0.1 % nasal spray USE 2 SPRAYS IN EACH NOSTRILS TWICE DAILY AS DIRECTED. 30 mL 0  . calcitonin, salmon, (MIACALCIN/FORTICAL) 200 UNIT/ACT nasal spray Place 1 spray into alternate nostrils daily. Every other day 3.7 mL 0  . Calcium Carbonate-Vitamin D (CALTRATE 600+D) 600-400 MG-UNIT tablet Take 1 tablet by mouth daily.     Marland Kitchen dextromethorphan-guaiFENesin (MUCINEX DM) 30-600 MG 12hr tablet Take 1 tablet by mouth 2 (two) times daily.    Marland Kitchen ENTRESTO 49-51 MG TAKE (1) TABLET BY MOUTH TWICE DAILY. 180 tablet 0  . HYDROcodone-acetaminophen (NORCO/VICODIN) 5-325 MG tablet Take 1 tablet by mouth every 6 (six) hours as needed for moderate pain. 20 tablet 0  . HYDROcodone-homatropine (HYCODAN) 5-1.5 MG/5ML syrup TAKE 5 ML BY MOUTH EVERY 6 HOURS AS NEEDED. 120 mL 0  . HYDROcodone-homatropine (HYCODAN) 5-1.5 MG/5ML syrup TAKE 2.5ML-5 ML BY MOUTH EVERY 8 HOURS AS NEEDED FOR COUGH. DO NOT TAKE AT THE SAME TIME AS XANAX. 90 mL 0  . LUTEIN PO  Take 1 capsule by mouth daily.    . meclizine (ANTIVERT) 25 MG tablet TAKE (1) TABLET BY MOUTH THREE TIMES DAILY AS NEEDED FOR DIZZINESS. 30 tablet 2  . montelukast (SINGULAIR) 10 MG tablet TAKE 1 TABLET BY MOUTH AT BEDTIME. 30 tablet 0  . Multiple Vitamins-Minerals (CENTRUM SILVER 50+MEN) TABS Take 1 tablet by mouth daily.    . NON FORMULARY Diet Type:  Gluten Free    . OXYGEN Inhale 4-6 L/min into the lungs continuous. To maintain sats > 88%    . pantoprazole (PROTONIX) 40 MG tablet TAKE ONE TABLET BY MOUTH DAILY. 30 tablet 0  . polyethylene glycol (MIRALAX / GLYCOLAX) packet  Take 17 g by mouth daily as needed for mild constipation. Mix in 8 oz liquid and drink    . predniSONE (DELTASONE) 5 MG tablet TAKE 1 TABLET BY MOUTH DAILY WITH BREAKFAST. 30 tablet 3  . spironolactone (ALDACTONE) 25 MG tablet TAKE (1/2) TABLET BY MOUTH DAILY. 45 tablet 0  . SYMBICORT 160-4.5 MCG/ACT inhaler INHALE 2 PUFFS INTO THE LUNGS EVERY 12 HOURS. 10.2 g 11  . tamsulosin (FLOMAX) 0.4 MG CAPS capsule TAKE ONE CAPSULE BY MOUTH ONCE DAILY. 30 capsule 0  . Tiotropium Bromide Monohydrate (SPIRIVA RESPIMAT) 2.5 MCG/ACT AERS Inhale 2 puffs into the lungs 2 (two) times daily. 1 Inhaler 0  . UNABLE TO FIND Apply Xerofoam gauze to Right forearm and cover with a dry dressing.  Check daily and change every 3 days and as needed     No current facility-administered medications for this encounter.     LABS/IMAGING: No results found for this or any previous visit (from the past 48 hour(s)). No results found.  WEIGHTS: Wt Readings from Last 3 Encounters:  04/26/19 56.6 kg (124 lb 12.8 oz)  03/06/19 56.5 kg (124 lb 9.6 oz)  02/02/19 56.1 kg (123 lb 9.6 oz)    VITALS: BP (!) 115/54   Pulse 75   Wt 56.6 kg (124 lb 12.8 oz)   SpO2 95%   BMI 20.14 kg/m    EXAM: General:  Elderly thin on O2 No resp difficulty HEENT: normal Neck: supple. no JVD. Carotids 2+ bilat; no bruits. No lymphadenopathy or thryomegaly  appreciated. Cor: PMI nondisplaced. Regular rate & rhythm. 2/6 Bruce Lungs: decreased throughout no wheeze  Abdomen: soft, nontender, nondistended. No hepatosplenomegaly. No bruits or masses. Good bowel sounds. Extremities: no cyanosis, clubbing, rash, edema Neuro: alert & orientedx3, cranial nerves grossly intact. moves all 4 extremities w/o difficulty. Affect pleasant    ASSESSMENT: 1. Chronic systolic HF EF 43%. EF now normal by Echo 04/12/16 LVEF 50-55%, Trivial AI, Mild Bruce, Mild LAE, PA peak pressure 46 mm Hg. Echo 10/18 with EF 50-55%.    - likely due to PVC cardiomyopathy     - EF improved with PVC suppression on amio    - Stable NYHA II-III    - Continue Entresto 49/51 and spiro 12.5 mg daily.  Will not uptitrate with age and borderline hyperkalemia.     - No b-blocker with severe COPD   - RTC in 6 months with repeat echo   2. PVC related cardiomyopathy   - PVCs suppressed with amio with normalization of LV function. amio now 116m daily. Will continue   - Agree with concern risk of amio toxicity given the severity of his COPD. That said risk of lung toxicity on amio 100 daily is very low and he was very ill with severe PVC cardiomyopathy. Not good candidate for PVC ablation. I previously discussed with Dr. ARayann Hemanin EP and we both agree that risk of stopping amio outweighs risk of continuing low-dose amio.  -check labs including TSH, LFTs. Eye exam 1/19 was ok.   3. Chronic respiratory failure due to COPD on home O2 - stable on 3-4L O2  4. HTN    -Blood pressure well controlled. Continue current regimen.    Bruce BickersMD 04/26/2019, 2:07 PM

## 2019-04-28 ENCOUNTER — Other Ambulatory Visit: Payer: Self-pay | Admitting: Family Medicine

## 2019-04-30 ENCOUNTER — Other Ambulatory Visit: Payer: Self-pay | Admitting: Family Medicine

## 2019-05-01 NOTE — Telephone Encounter (Signed)
Last filled: 11/28/2018 Last office visit: 02/14/2019

## 2019-05-28 ENCOUNTER — Ambulatory Visit (INDEPENDENT_AMBULATORY_CARE_PROVIDER_SITE_OTHER): Payer: Medicare Other | Admitting: Family Medicine

## 2019-05-28 ENCOUNTER — Telehealth: Payer: Self-pay | Admitting: Family Medicine

## 2019-05-28 ENCOUNTER — Encounter: Payer: Self-pay | Admitting: Family Medicine

## 2019-05-28 ENCOUNTER — Other Ambulatory Visit: Payer: Self-pay

## 2019-05-28 VITALS — BP 146/68 | HR 88 | Temp 97.9°F | Resp 24 | Ht 66.5 in

## 2019-05-28 DIAGNOSIS — J441 Chronic obstructive pulmonary disease with (acute) exacerbation: Secondary | ICD-10-CM | POA: Diagnosis not present

## 2019-05-28 MED ORDER — CEFTRIAXONE SODIUM 1 G IJ SOLR
1.0000 g | Freq: Once | INTRAMUSCULAR | Status: AC
  Administered 2019-05-28: 1 g via INTRAMUSCULAR

## 2019-05-28 MED ORDER — PREDNISONE 20 MG PO TABS: 60.0000 mg | ORAL_TABLET | Freq: Every day | ORAL | 0 refills | Status: DC

## 2019-05-28 MED ORDER — METHYLPREDNISOLONE ACETATE 40 MG/ML IJ SUSP
40.0000 mg | Freq: Once | INTRAMUSCULAR | Status: AC
  Administered 2019-05-28: 60 mg via INTRAMUSCULAR

## 2019-05-28 MED ORDER — DOXYCYCLINE HYCLATE 100 MG PO TABS: 100.0000 mg | ORAL_TABLET | Freq: Two times a day (BID) | ORAL | 0 refills | Status: DC

## 2019-05-28 MED ORDER — AMOXICILLIN-POT CLAVULANATE ER 1000-62.5 MG PO TB12: 2.0000 | ORAL_TABLET | Freq: Two times a day (BID) | ORAL | 0 refills | Status: DC

## 2019-05-28 NOTE — Telephone Encounter (Signed)
Pt aware.

## 2019-05-28 NOTE — Progress Notes (Signed)
Subjective:    Patient ID: Bruce Travis, male    DOB: [birth year], 84 y.o.   MRN: [id number]  HPI  Patient is here today reporting increasing shortness of breath.  Past medical history is significant for COPD with frequent COPD exacerbations.  He has been using his albuterol every 4 hours with minimal relief.  Shortness of breath began on Friday.  It has steadily worsened over the weekend.  He has a cough productive of white and yellow sputum.  He also reports left posterior pleurisy.  However on exam, there are no crackles appreciated in the left lower lobe.  However in the right lower lobe there are definite fine crackles and rhonchorous breath sounds.  He also has diminished breath sounds throughout with faint expiratory wheezing and poor air exchange. Past Medical History:  Diagnosis Date  . Allergic rhinitis   . Anxiety   . Bronchiectasis   . CAD (coronary artery disease)   . Celiac disease   . Chronic heart failure ([place])   . Chronic kidney disease (CKD), stage III (moderate)   . COPD (chronic obstructive pulmonary disease) ([place])   . Diverticulosis of colon (without mention of hemorrhage)   . Esophageal reflux   . Family history of adverse reaction to anesthesia    daughter gets PONV  . Family hx of colon cancer   . History of stomach ulcers 1980s  . Hyperlipemia   . Hypertension   . 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 Myocardial infarction [facility])    "previous MI/echo in 12/2015"  . Nephrolithiasis    "got them now; never had OR/scopes" (02/03/2016)  . On home oxygen therapy    "3L-5L; 24/7" (12/29/2017)  . Other diseases of lung, not elsewhere classified   . Personal history of colonic polyps 04/26/2007   hyperplastic   . Pneumonia "several times"   Past Surgical History:  Procedure Laterality Date  . CARDIAC CATHETERIZATION  02/03/2016  . CARDIAC CATHETERIZATION  09/30/2009   non-obstructive CAD w/30% narrowing in prox LAD (Dr. Waunita Schooner)  .  CARDIAC CATHETERIZATION  05/04/2001   same at 2011 cath (Dr. Waunita Schooner)  . CARDIAC CATHETERIZATION N/A 02/03/2016   Procedure: Right/Left Heart Cath and Coronary Angiography;  Surgeon: Pixie Casino, MD;  Location: [place] [facility];  Service: Cardiovascular;  Laterality: N/A;  . EPIGLOTOPLASTY W/ MLB     removal due to carcinoma  . EXCISIONAL HEMORRHOIDECTOMY  2000s  . HAND SURGERY Right 1958   d/t crush injury  . TONSILLECTOMY AND ADENOIDECTOMY    . ULTRASOUND GUIDANCE FOR VASCULAR ACCESS  02/03/2016   Procedure: Ultrasound Guidance For Vascular Access;  Surgeon: Pixie Casino, MD;  Location: [place] [facility];  Service: Cardiovascular;;  . VASECTOMY     Current Outpatient Medications on File Prior to Visit  Medication Sig Dispense Refill  . acetaminophen (TYLENOL) 325 MG tablet Take 2 tablets (650 mg total) by mouth every 6 (six) hours as needed for fever.    Marland Kitchen albuterol (PROVENTIL HFA;VENTOLIN HFA) 108 (90 Base) MCG/ACT inhaler Inhale 2 puffs into the lungs every 4 (four) hours as needed for wheezing or shortness of breath. 1 Inhaler 0  . albuterol (PROVENTIL) (2.5 MG/3ML) 0.083% nebulizer solution Take 3 mLs (2.5 mg total) by nebulization every 4 (four) hours as needed for wheezing or shortness of breath. 360 mL 0  . ALPRAZolam (XANAX) 0.25 MG tablet TAKE ONE TABLET BY MOUTH TWICE A DAY  AS NEEDED FOR ANXIETY. 30 tablet 0  . amiodarone (PACERONE) 200 MG tablet TAKE 1/2 TABLET BY MOUTH ONCE DAILY. 45 tablet 0  . aspirin EC 81 MG tablet Take 81 mg by mouth daily.    Marland Kitchen atorvastatin (LIPITOR) 20 MG tablet TAKE ONE TABLET BY MOUTH ONCE DAILY. 90 tablet 0  . azelastine (ASTELIN) 0.1 % nasal spray USE 2 SPRAYS IN EACH NOSTRILS TWICE DAILY AS DIRECTED. 30 mL 0  . calcitonin, salmon, (MIACALCIN/FORTICAL) 200 UNIT/ACT nasal spray Place 1 spray into alternate nostrils daily. Every other day 3.7 mL 0  . Calcium Carbonate-Vitamin D (CALTRATE 600+D) 600-400 MG-UNIT tablet Take 1 tablet by mouth  daily.     Marland Kitchen dextromethorphan-guaiFENesin (MUCINEX DM) 30-600 MG 12hr tablet Take 1 tablet by mouth 2 (two) times daily.    Marland Kitchen ENTRESTO 49-51 MG TAKE (1) TABLET BY MOUTH TWICE DAILY. 180 tablet 0  . HYDROcodone-acetaminophen (NORCO/VICODIN) 5-325 MG tablet Take 1 tablet by mouth every 6 (six) hours as needed for moderate pain. 20 tablet 0  . HYDROcodone-homatropine (HYCODAN) 5-1.5 MG/5ML syrup TAKE 5 ML BY MOUTH EVERY 6 HOURS AS NEEDED. 120 mL 0  . HYDROcodone-homatropine (HYCODAN) 5-1.5 MG/5ML syrup TAKE 2.5ML-5 ML BY MOUTH EVERY 8 HOURS AS NEEDED FOR COUGH. DO NOT TAKE AT THE SAME TIME AS XANAX. 90 mL 0  . LUTEIN PO Take 1 capsule by mouth daily.    . meclizine (ANTIVERT) 25 MG tablet TAKE (1) TABLET BY MOUTH THREE TIMES DAILY AS NEEDED FOR DIZZINESS. 30 tablet 2  . montelukast (SINGULAIR) 10 MG tablet TAKE 1 TABLET BY MOUTH AT BEDTIME. 30 tablet 0  . Multiple Vitamins-Minerals (CENTRUM SILVER 50+MEN) TABS Take 1 tablet by mouth daily.    . NON FORMULARY Diet Type:  Gluten Free    . OXYGEN Inhale 4-6 L/min into the lungs continuous. To maintain sats > 88%    . pantoprazole (PROTONIX) 40 MG tablet TAKE ONE TABLET BY MOUTH DAILY. 30 tablet 0  . polyethylene glycol (MIRALAX / GLYCOLAX) packet Take 17 g by mouth daily as needed for mild constipation. Mix in 8 oz liquid and drink    . predniSONE (DELTASONE) 5 MG tablet TAKE 1 TABLET BY MOUTH DAILY WITH BREAKFAST. 30 tablet 0  . SPIRIVA RESPIMAT 2.5 MCG/ACT AERS INHALE 2 PUFFS ONCE DAILY AS DIRECTED. 4 g 5  . spironolactone (ALDACTONE) 25 MG tablet TAKE (1/2) TABLET BY MOUTH DAILY. 45 tablet 0  . SYMBICORT 160-4.5 MCG/ACT inhaler INHALE 2 PUFFS INTO THE LUNGS EVERY 12 HOURS. 10.2 g 11  . tamsulosin (FLOMAX) 0.4 MG CAPS capsule TAKE ONE CAPSULE BY MOUTH ONCE DAILY. 30 capsule 0   No current facility-administered medications on file prior to visit.    Allergies  Allergen Reactions  . Codeine Other (See Comments)    Thought head was going to blow  off/ severe headache  . Gluten Meal Other (See Comments)    Celiac disease  . Adhesive [Tape] Other (See Comments)    Band-aids - tears skin off   Social History   Socioeconomic History  . Marital status: Married    Spouse name: Not on file  . Number of children: 3  . Years of education: Not on file  . Highest education level: Not on file  Occupational History  . 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 Financial resource strain: Not on file  . Food insecurity  Worry: Not on file    Inability: Not on file  . Transportation needs    Medical: Not on file    Non-medical: Not on file  Tobacco Use  . Smoking status: Former Smoker    Packs/day: 2.00    Years: 35.00    Pack years: 70.00    Types: Cigarettes    Quit date: 01/21/1989    Years since quitting: 30.3  . Smokeless tobacco: Former Systems developer    Types: [place] date: 08/23/2001  Substance and Sexual Activity  . Alcohol use: No  . Drug use: No  . Sexual activity: Not on file  Lifestyle  . Physical activity    Days per week: Not on file    Minutes per session: Not on file  . Stress: Not on file  Relationships  . Social Herbalist on phone: Not on file    Gets together: Not on file    Attends religious service: Not on file    Active member of club or organization: Not on file    Attends meetings of clubs or organizations: Not on file    Relationship status: Not on file  . Intimate partner violence    Fear of current or ex partner: Not on file    Emotionally abused: Not on file    Physically abused: Not on file    Forced sexual activity: Not on file  Other Topics Concern  . Not on file  Social History Narrative  . Not on file      Review of Systems  All other systems reviewed and are negative.      Objective:   Physical Exam  HENT:  Right Ear: Tympanic membrane and ear canal normal.  Left Ear: Tympanic membrane and ear canal normal.   Nose: No mucosal edema or rhinorrhea.  Cardiovascular: Normal rate, regular rhythm and normal heart sounds.  No murmur heard. Pulmonary/Chest: Effort normal. No respiratory distress. He has decreased breath sounds in the right upper field, the right lower field, the left upper field and the left lower field. He has wheezes in the right upper field, the right lower field, the left upper field and the left lower field. He has rhonchi in the right upper field, the right lower field, the left upper field and the left lower field. He has rales in the right lower field. He exhibits no tenderness.  Abdominal: Soft. Bowel sounds are normal.  Musculoskeletal:        General: No edema.  Vitals reviewed.        Assessment & Plan:   1. COPD with acute exacerbation [facility]) Patient is having a COPD exacerbation and possible early community-acquired pneumonia.  Begin by giving the patient 1 g of Rocephin IM x1 now and 60 mg of Depo-Medrol IM x1.  Then begin Augmentin extended release 2000 mg twice daily for possible community-acquired pneumonia coupled with doxycycline 100 mg p.o. twice daily for 10 days.  Avoid Zithromax due to amiodarone.  Recheck in 24 hours or sooner if worsening.

## 2019-05-28 NOTE — Telephone Encounter (Signed)
Patient left VM stating pharmacy didn't have Augmentin until tomorrow. He would like to know if that is okay for him to wait.  CB#  [phone]

## 2019-05-28 NOTE — Telephone Encounter (Signed)
With rocephin, should be ok, start doxycycline

## 2019-05-28 NOTE — Addendum Note (Signed)
Addended by: Shary Decamp B on: 05/28/2019 12:16 PM   Modules accepted: Orders

## 2019-05-29 ENCOUNTER — Other Ambulatory Visit: Payer: Self-pay | Admitting: Family Medicine

## 2019-06-01 ENCOUNTER — Other Ambulatory Visit (HOSPITAL_COMMUNITY): Payer: Self-pay | Admitting: Internal Medicine

## 2019-06-01 ENCOUNTER — Other Ambulatory Visit: Payer: Self-pay | Admitting: Family Medicine

## 2019-06-01 ENCOUNTER — Telehealth: Payer: Self-pay

## 2019-06-01 NOTE — Telephone Encounter (Signed)
Pt is requesting refill on Xanax   LOV: 05/28/19  LRF:   05/01/19

## 2019-06-01 NOTE — Telephone Encounter (Signed)
Pt called about his prednisone stating that the last time he was on a high dose and stopped suddenly, he got sick again. Pt states that he is leaving for the beach on Monday 09[place]12 and he will be out of his prednisone and he wanted to know if you can send in another Rx for a lower dose to gradually take him off the prednisone? Please advise.

## 2019-06-04 ENCOUNTER — Other Ambulatory Visit: Payer: Self-pay

## 2019-06-04 MED ORDER — PREDNISONE 20 MG PO TABS: ORAL_TABLET | ORAL | 0 refills | Status: DC

## 2019-06-04 MED ORDER — PREDNISONE 5 MG PO TABS: 5.0000 mg | ORAL_TABLET | Freq: Every day | ORAL | 0 refills | Status: DC

## 2019-06-04 NOTE — Telephone Encounter (Signed)
Rx sent to pharmacy   

## 2019-06-04 NOTE — Telephone Encounter (Signed)
Prednisone 20 mg poqday for 3 days, then 10 mg poqday for 3 days then 5 mg a day thereafter.

## 2019-06-06 ENCOUNTER — Ambulatory Visit: Payer: Medicare Other | Admitting: Internal Medicine

## 2019-06-07 ENCOUNTER — Ambulatory Visit: Payer: Medicare Other | Admitting: Internal Medicine

## 2019-06-15 ENCOUNTER — Other Ambulatory Visit (HOSPITAL_COMMUNITY): Payer: Self-pay | Admitting: Internal Medicine

## 2019-06-19 ENCOUNTER — Ambulatory Visit: Payer: Medicare Other | Admitting: Internal Medicine

## 2019-06-25 ENCOUNTER — Ambulatory Visit (INDEPENDENT_AMBULATORY_CARE_PROVIDER_SITE_OTHER): Payer: Medicare Other

## 2019-06-25 ENCOUNTER — Encounter: Payer: Self-pay | Admitting: Internal Medicine

## 2019-06-25 ENCOUNTER — Other Ambulatory Visit: Payer: Self-pay

## 2019-06-25 ENCOUNTER — Ambulatory Visit: Payer: Medicare Other | Admitting: Internal Medicine

## 2019-06-25 DIAGNOSIS — J449 Chronic obstructive pulmonary disease, unspecified: Secondary | ICD-10-CM

## 2019-06-25 DIAGNOSIS — R918 Other nonspecific abnormal finding of lung field: Secondary | ICD-10-CM | POA: Diagnosis not present

## 2019-06-25 DIAGNOSIS — J189 Pneumonia, unspecified organism: Secondary | ICD-10-CM

## 2019-06-25 DIAGNOSIS — J9611 Chronic respiratory failure with hypoxia: Secondary | ICD-10-CM | POA: Diagnosis not present

## 2019-06-25 NOTE — Progress Notes (Signed)
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 early 1990s)  with some dysphagia, allergic rhinitis, and esophageal reflux.    History of Present Illness  11/08/2011 Bruce Travis/ f/u ov on maint rx with symbicort and spiriva c/o nasal symptoms worse but  breathing fine, no limits as long as paces himself and no cough.  Main nasal issue is congestion/itching/sneezing worse x 2 weeks with purulent bloody secretions s HA/toothache  But ahs noted  year round nasal symptoms and worse x 2 years which do not correlate with breathing status Stopped nasonex due to insurance > no worse off it> has not f/u with [name] yet for ent re-eval No daytime saba need rec Augmentin 875 mg twice daily x 10days Prednisone 10 mg take  4 each am x 2 days,   2 each am x 2 days,  1 each am x2days and stop See Dr [name] in 2 weeks to let him direct you further on what to do about your chronic sinus symptoms      03/06/2013 f/u ov/Bruce Travis re GOLD II COPD/ chronic hoarse p surg ([facility])/ maint symb/ spiriva Chief Complaint  Patient presents with  . Follow-up    Pt states breathing is the same since last visit, no better or worse. Denies any new co's today.    main issue is yardwork in heat, bettter ex tol p uses saba, hfa good techniqe, never uses neb  rec Only use your albuterol (proaire) as a rescue medication Please see patient coordinator before you leave today  to schedule CT chest to compare to previous studies for your lung nodules> no change x 4 years so directed f/u indicated        04/12/2017  f/u ov/Bruce Travis re:  GOLD II copd / maint symb 160/spiriva dpi  Chief Complaint  Patient presents with  . Follow-up    Breathing is much improved since the last visit. He is attending pulmonary rehab 2 x per wk. He is using proair once per wk on average.   On 3lpm most the  time/ 6lpm with exertion like at rehab, doing about the same vs a year prior to OV   Back to doing some walking at [facility] behind a cart with 02 in the cart  Using proair first thing in am ? Why as supposed to use just prn  No worse sob off prednisone  rec Try spiriva respimat 2 pffs each am right behind the symbicort         Admit date: 12/29/2017 Discharge date: 12/31/2017  Following problems were addressed during his hospitalization:  Acute on chronic respiratory failure with hypoxia: Secondary to COPD and pneumonia. Continue supplemental oxygen at home, bronchodilators, steroids, antibiotics.  Community-acquired pneumonia:Chest x-ray showed bilateral pneumonia.Patient failed outpatient oral antibiotic. Started on azithromycin and ceftriaxone here.  Antibiotics changed to oral on discharge Negative  blood cultures, sputum cultures so far.Urine Streptococcalantigen negative.  COPD: Follows with pulmonology as an outpatient. On nebulization machine, bronchodilators at home.On 3 L oxygen at rest.  GERD: Continue Protonix  Hypertension: Currently blood pressure stable.Continue home meds.  Coronary artery disease with ischemic cardiomyopathy: Currently stable. No chest pain. Continue aspirin, Entresto, amiodarone  Chronic kidney disease stage III: Currently creatinine on baseline  History of ZHY:QMVHQION Flomax  Lactic acidosis:  Improved.  Diastolic CHF: 2D echo on 90/30 showed EF 55 to 60%.Currently he is euvolemic. No peripheral edema. Continue Entresto. Spironolactone on hold. Not on beta-blocker therapy due to severe COPD.  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 Follows with cardiology   Discharge Diagnoses:  Principal Problem:   Acute respiratory failure with hypoxia ([place]) Active Problems:   GERD   COPD exacerbation ([place])   Hypertension   Community acquired pneumonia of left lower lobe of lung ([place])   CAD (coronary artery  disease)   Chronic kidney disease (CKD), stage III (moderate) ([place])     02/17/2018  Ext post hosp f/u ov/Bruce Travis re:  Copd GOLD II/ 02 dep transition of care s/p above admit  Chief Complaint  Patient presents with  . Follow-up    Breathing is unchanged. He rarely uses albuterol inhaler or neb. He sta  Dyspnea:  On 3-4lpm can do any store = MMRC2 = can't walk a nl pace on a flat grade s sob but does fine slow and flat  Cough: min sporadic white mucus SABA use: rarely hfa/ no neb   Sleeping: lying flat on 3lpm ok  rec F/u cxr  > cleared     03/06/2019  f/u ov/Bruce Travis re:  COPD GOLD II criteria but 02 dep 3lpm at rest and up to 6 lpm walk maint on symb/spiriva Chief Complaint  Patient presents with  . Follow-up    Patient reports that his breathing has been doing good recently. He reports some days are bad and some are not. He reports very seldom using his rescue inhaler.   Dyspnea:  Able to do houswork, dishes, outside driveway out to road about 1000 ft at 5-6 lpm with sats lower 80's but device goes as high as 8 and does not titrate Cough: minimal rattling min mucus white does not wake him up  Sleeping: bed flat, a couple of pillow SABA use: rarely saba hfa / never neb  02: as above  rec Keep up the walking to the road but adjust the 02 to what you need to keep your saturations above 90%   06/25/2019  f/u ov/Bruce Travis re: COPD  GOLD II spirometry but  02 dep  Chief Complaint  Patient presents with  . Follow-up    Pt states had PNA and was treated by Dr Dennard Schaumann approx 3 wks ago. He has had some problems with aspiration recently. Having some increased SOB.   Dyspnea:  Room to room on  4 lpm  = MMRC3 = can't walk 100 yards even at a slow pace at a flat grade s stopping due to sob   / worse x 3 weeks since ? Asp  Cough: back to nl since rx for pna doxy / aug Sleeping: flat/ one pillow  SABA use: no neb / rarely using hfa  02: 4lpm 24/7    No obvious day to day or daytime variability or  assoc excess/ purulent sputum or mucus plugs or hemoptysis or cp or chest tightness, subjective wheeze or overt sinus or hb symptoms.   Sleeping as above without nocturnal  or early am exacerbation  of respiratory  c/o's or need for noct saba. Also denies any obvious fluctuation of symptoms with weather or environmental changes or other aggravating or alleviating factors except as outlined above   No unusual exposure hx or h/o childhood pna/ asthma or knowledge of premature birth.  Current Allergies, Complete Past Medical History, Past Surgical History, Family History, and Social History were reviewed in [place]  [facility] electronic medical record.  ROS  The following are not active complaints unless bolded Hoarseness, sore throat, dysphagia, dental problems, itching, sneezing,  nasal congestion or discharge of excess mucus or purulent secretions, ear ache,   fever, chills, sweats, unintended wt loss or wt gain, classically pleuritic or exertional cp,  orthopnea pnd or arm/hand swelling  or leg swelling, presyncope, palpitations, abdominal pain, anorexia, nausea, vomiting, diarrhea  or change in bowel habits or change in bladder habits, change in stools or change in urine, dysuria, hematuria,  rash, arthralgias, visual complaints, headache, numbness, weakness or ataxia or problems with walking or coordination,  change in mood or  memory.        Current Meds  Medication Sig  . acetaminophen (TYLENOL) 325 MG tablet Take 2 tablets (650 mg total) by mouth every 6 (six) hours as needed for fever.  Marland Kitchen albuterol (PROVENTIL HFA;VENTOLIN HFA) 108 (90 Base) MCG/ACT inhaler Inhale 2 puffs into the lungs every 4 (four) hours as needed for wheezing or shortness of breath.  Marland Kitchen albuterol (PROVENTIL) (2.5 MG/3ML) 0.083% nebulizer solution Take 3 mLs (2.5 mg total) by nebulization every 4 (four) hours as needed for wheezing or shortness of breath.  . ALPRAZolam (XANAX) 0.25 MG tablet TAKE ONE TABLET BY MOUTH TWICE A DAY  AS NEEDED FOR ANXIETY.  Marland Kitchen amiodarone (PACERONE) 200 MG tablet TAKE 1/2 TABLET BY MOUTH ONCE DAILY.  Marland Kitchen aspirin EC 81 MG tablet Take 81 mg by mouth daily.  Marland Kitchen atorvastatin (LIPITOR) 20 MG tablet TAKE ONE TABLET BY MOUTH ONCE DAILY.  Marland Kitchen azelastine (ASTELIN) 0.1 % nasal spray USE 2 SPRAYS IN EACH NOSTRILS TWICE DAILY AS DIRECTED.  Marland Kitchen calcitonin, salmon, (MIACALCIN/FORTICAL) 200 UNIT/ACT nasal spray Place 1 spray into alternate nostrils daily. Every other day  . Calcium Carbonate-Vitamin D (CALTRATE 600+D) 600-400 MG-UNIT tablet Take 1 tablet by mouth daily.   Marland Kitchen dextromethorphan-guaiFENesin (MUCINEX DM) 30-600 MG 12hr tablet Take 1 tablet by mouth 2 (two) times daily.  Marland Kitchen ENTRESTO 49-51 MG TAKE (1) TABLET BY MOUTH TWICE DAILY.  Marland Kitchen HYDROcodone-acetaminophen (NORCO/VICODIN) 5-325 MG tablet Take 1 tablet by mouth every 6 (six) hours as needed for moderate pain.  Marland Kitchen HYDROcodone-homatropine (HYCODAN) 5-1.5 MG/5ML syrup TAKE 5 ML BY MOUTH EVERY 6 HOURS AS NEEDED.  Marland Kitchen LUTEIN PO Take 1 capsule by mouth daily.  . meclizine (ANTIVERT) 25 MG tablet TAKE (1) TABLET BY MOUTH THREE TIMES DAILY AS NEEDED FOR DIZZINESS.  . montelukast (SINGULAIR) 10 MG tablet TAKE 1 TABLET BY MOUTH AT BEDTIME.  . Multiple Vitamins-Minerals (CENTRUM SILVER 50+MEN) TABS Take 1 tablet by mouth daily.  . NON FORMULARY Diet Type:  Gluten Free  . OXYGEN Inhale 4-6 L/min into the lungs continuous. To maintain sats > 88%  . pantoprazole (PROTONIX) 40 MG tablet TAKE ONE TABLET BY MOUTH DAILY.  Marland Kitchen polyethylene glycol (MIRALAX / GLYCOLAX) packet Take 17 g by mouth daily as needed for mild constipation. Mix in 8 oz liquid and drink  . predniSONE (DELTASONE) 5 MG tablet Take 1 tablet (5 mg total) by mouth daily with breakfast. Start the 5 mg (1 tab) after you complete the 10 mg.  . SPIRIVA RESPIMAT 2.5 MCG/ACT AERS INHALE 2 PUFFS ONCE DAILY AS DIRECTED.  Marland Kitchen spironolactone (ALDACTONE) 25 MG tablet TAKE (1/2) TABLET BY MOUTH DAILY.  . SYMBICORT 160-4.5  MCG/ACT inhaler INHALE 2 PUFFS INTO THE LUNGS EVERY 12 HOURS.  . tamsulosin (FLOMAX) 0.4 MG CAPS capsule TAKE ONE CAPSULE BY MOUTH ONCE DAILY.  Objective:   Physical Exam     06/25/2019   123 Wt  140 04/08/2011 > 06/08/2011 141 >136 09/09/2011 > 141 11/08/2011 > 03/07/2012  139 > 09/07/2012  141 > 136 03/06/2013 > 03/01/2017   128 >  04/12/2017  125 > 07/11/2017   128 > 11/15/2017  129 > 02/17/2018   126 > 06/21/2018  132 >  03/06/2019   124    amb wm nad  BP 118/72 (BP Location: Left Arm, Cuff Size: Normal)   Pulse 82   Temp 97.6 F (36.4 C) (Temporal)   Ht 5' 6.5" (1.689 m)   Wt 123 lb 12.8 oz (56.2 kg)   SpO2 95% Comment: 4lpm cont o2  BMI 19.68 kg/m       HEENT : pt wearing mask not removed for exam due to covid -19 concerns.    NECK :  without JVD/Nodes/TM/ nl carotid upstrokes bilaterally   LUNGS: no acc muscle use,  Mod barrel  contour chest wall with bilateral  Distant bs s audible wheeze and  without cough on insp or exp maneuvers and mod  Hyperresonant  to  percussion bilaterally     CV:  RRR  no s3 or murmur or increase in P2, and no edema   ABD:  soft and nontender with pos mid insp Hoover's  in the supine position. No bruits or organomegaly appreciated, bowel sounds nl  MS:     ext warm without deformities, calf tenderness, cyanosis or clubbing No obvious joint restrictions   SKIN: warm and dry without lesions    NEURO:  alert, approp, nl sensorium with  no motor or cerebellar deficits apparent.        CXR PA and Lateral:   06/25/2019 :    I personally reviewed images and agree with radiology impression as follows:   No acute disease. Emphysema.            Assessment & Plan:

## 2019-06-25 NOTE — Progress Notes (Signed)
Spoke with pt and notified of results per Dr. [name]. Pt verbalized understanding and denied any questions. 

## 2019-06-25 NOTE — Patient Instructions (Addendum)
Ok to use albuterol 15 min before exertion to see if it helps your breathing  Adjust your 02 flow to keep it above 90%   Please remember to go to the  x-ray department  for your tests - we will call you with the results when they are available     Please schedule a follow up visit in 3 months but call sooner if needed

## 2019-06-26 ENCOUNTER — Encounter: Payer: Self-pay | Admitting: Internal Medicine

## 2019-06-26 ENCOUNTER — Other Ambulatory Visit: Payer: Medicare Other

## 2019-06-26 DIAGNOSIS — Z0000 Encounter for general adult medical examination without abnormal findings: Secondary | ICD-10-CM

## 2019-06-26 NOTE — Assessment & Plan Note (Signed)
Adequate control on present rx, reviewed in detail with pt > no change in rx needed     Advised : Make sure you check your oxygen saturations at highest level of activity to be sure it stays over 90% and adjust upward to maintain this level if needed but remember to turn it back to previous settings when you stop (to conserve your supply).    I had an extended discussion with the patient reviewing all relevant studies completed to date and  lasting 15 to 20 minutes of a 25 minute visit    Each maintenance medication was reviewed in detail including most importantly the difference between maintenance and prns and under what circumstances the prns are to be triggered using an action plan format that is not reflected in the computer generated alphabetically organized AVS.     Please see AVS for specific instructions unique to this visit that I personally wrote and verbalized to the the pt in detail and then reviewed with pt  by my nurse highlighting any  changes in therapy recommended at today's visit to their plan of care.

## 2019-06-26 NOTE — Assessment & Plan Note (Addendum)
CT 2010 copd only   >CT chest 03/12/2011 >>Severe centrilobular emphysema with areas of new and persistent  nodularity  A new somewhat spiculated opacity within the left upper lobe  measures 1.6 x 2.5 cm on transverse image 13 of series 6 > CT chest 03/07/2012 1. The vast majority of the previously noted pulmonary nodules are unchanged in size, number and distribution (and the overall appearance of the lungs could suggest sequelae of a pneumoconiosis or simply sequelae of old granulomatous disease), with exception of the right lower lobe pulmonary nodules as mentioned above. The largest of these new nodules is 7 mm and has spiculated margins and some surrounding architectural distortion.  - Repeat ct rec 03/06/2013 > No changes so no dedicated f/u  -  Chest CT w/o contrast 02/22/17 Bilateral lower lobe bronchiectasis with bilateral lower lobe tree-in-bud opacities compatible with infection. Mucous fills multiple bilateral lower lobe bronchi. 4 x 3.5 x 2 cm confluent peripheral opacity within the posterior left lower lobe is suggestive of atelectasis. > cleared on cxr 07/11/2017   - cxr 06/25/2019 no serial changes [facility] or elsewhere   >>> no additional w/u indicated, sp rx for ? Asp pna with no as dz so no need for rx other than swallowing precautions s/p epiglottic surgery remotely

## 2019-06-26 NOTE — Assessment & Plan Note (Addendum)
Quit smoking 1990   - PFT's 06/08/11  FEV1  1.71 (67%) ratio 37 and no better p B2 with DLC0 54% -  PFT's  04/12/2017  FEV1 1.62  (65 % ) ratio 43  p 6 % improvement from saba p spiriva  prior to study with DLCO  24/23 % corrects to 27  % for alv volume   - 04/12/2017  After extensive coaching device effectiveness =    75% so changed to spiriva smi     - 11/15/2017  After extensive coaching inhaler device  effectiveness =    90%     Group D in terms of symptom/risk and laba/lama/ICS  therefore appropriate rx at this point >>>  Continue symb/ spiriva    I spent extra time with pt today reviewing appropriate use of albuterol for prn use on exertion with the following points: 1) saba is for relief of sob that does not improve by walking a slower pace or resting but rather if the pt does not improve after trying this first. 2) If the pt is convinced, as many are, that saba helps recover from activity faster then it's easy to tell if this is the case by re-challenging : ie stop, take the inhaler, then p 5 minutes try the exact same activity (intensity of workload) that just caused the symptoms and see if they are substantially diminished or not after saba 3) if there is an activity that reproducibly causes the symptoms, try the saba 15 min before the activity on alternate days   If in fact the saba really does help, then fine to continue to use it prn but advised may need to look closer at the maintenance regimen being used to achieve better control of airways disease with exertion.        Marland Kitchen

## 2019-06-27 ENCOUNTER — Other Ambulatory Visit: Payer: Self-pay | Admitting: Family Medicine

## 2019-06-27 LAB — CBC WITH DIFFERENTIAL/PLATELET
Absolute Monocytes: 723 cells/uL (ref 200–950)
Basophils Absolute: 43 cells/uL (ref 0–200)
Basophils Relative: 0.5 %
Eosinophils Absolute: 791 cells/uL — ABNORMAL HIGH (ref 15–500)
Eosinophils Relative: 9.3 %
HCT: 43.7 % (ref 38.5–50.0)
Hemoglobin: 14.6 g/dL (ref 13.2–17.1)
Lymphs Abs: 1360 cells/uL (ref 850–3900)
MCH: 33.1 pg — ABNORMAL HIGH (ref 27.0–33.0)
MCHC: 33.4 g/dL (ref 32.0–36.0)
MCV: 99.1 fL (ref 80.0–100.0)
MPV: 10.9 fL (ref 7.5–12.5)
Monocytes Relative: 8.5 %
Neutro Abs: 5585 cells/uL (ref 1500–7800)
Neutrophils Relative %: 65.7 %
Platelets: 167 10*3/uL (ref 140–400)
RBC: 4.41 10*6/uL (ref 4.20–5.80)
RDW: 12.4 % (ref 11.0–15.0)
Total Lymphocyte: 16 %
WBC: 8.5 10*3/uL (ref 3.8–10.8)

## 2019-06-27 LAB — COMPREHENSIVE METABOLIC PANEL
AG Ratio: 1.8 (calc) (ref 1.0–2.5)
ALT: 20 U/L (ref 9–46)
AST: 14 U/L (ref 10–35)
Albumin: 3.5 g/dL — ABNORMAL LOW (ref 3.6–5.1)
Alkaline phosphatase (APISO): 57 U/L (ref 35–144)
BUN/Creatinine Ratio: 25 (calc) — ABNORMAL HIGH (ref 6–22)
BUN: 29 mg/dL — ABNORMAL HIGH (ref 7–25)
CO2: 27 mmol/L (ref 20–32)
Calcium: 9.2 mg/dL (ref 8.6–10.3)
Chloride: 108 mmol/L (ref 98–110)
Creat: 1.14 mg/dL — ABNORMAL HIGH (ref 0.70–1.11)
Globulin: 1.9 g/dL (calc) (ref 1.9–3.7)
Glucose, Bld: 76 mg/dL (ref 65–99)
Potassium: 4.7 mmol/L (ref 3.5–5.3)
Sodium: 144 mmol/L (ref 135–146)
Total Bilirubin: 0.7 mg/dL (ref 0.2–1.2)
Total Protein: 5.4 g/dL — ABNORMAL LOW (ref 6.1–8.1)

## 2019-06-27 LAB — LIPID PANEL
Cholesterol: 134 mg/dL (ref <200)
HDL: 39 mg/dL — ABNORMAL LOW (ref >=40)
LDL Cholesterol (Calc): 75 mg/dL (calc)
Non-HDL Cholesterol (Calc): 95 mg/dL (calc) (ref <130)
Total CHOL/HDL Ratio: 3.4 (calc) (ref <5.0)
Triglycerides: 122 mg/dL (ref <150)

## 2019-06-27 LAB — PSA: PSA: 0.6 ng/mL (ref <=4.0)

## 2019-06-27 NOTE — Telephone Encounter (Signed)
Pt is requesting refill on Xanax   LOV: 05/28/19  LRF:   1012/20

## 2019-07-03 ENCOUNTER — Encounter: Payer: Medicare Other | Admitting: Family Medicine

## 2019-07-09 ENCOUNTER — Other Ambulatory Visit: Payer: Self-pay | Admitting: Family Medicine

## 2019-07-09 NOTE — Telephone Encounter (Signed)
Ok to refill cough syrup 

## 2019-07-10 ENCOUNTER — Telehealth: Payer: Self-pay | Admitting: Family Medicine

## 2019-07-10 MED ORDER — DOXYCYCLINE HYCLATE 100 MG PO TABS: 100.0000 mg | ORAL_TABLET | Freq: Two times a day (BID) | ORAL | 0 refills | Status: DC

## 2019-07-10 NOTE — Telephone Encounter (Signed)
Pt aware and med sent to [facility] [address]

## 2019-07-10 NOTE — Telephone Encounter (Signed)
Doxycycline 100 bid for 7 days Prednisone 40 mg poqday for 7 days

## 2019-07-10 NOTE — Telephone Encounter (Signed)
Pt called and states he just does not feel good, more SOB then usual, coughing up some yellow stuff but not bad.  No fever. He states that he has the Augmentin 1026m left about 12-14 pills of that and prednisone. Wanted to know if he should take that or would you call him in something else? He said you could do a telephone visit if you like.    CB# [phone]

## 2019-07-25 ENCOUNTER — Other Ambulatory Visit: Payer: Self-pay

## 2019-07-25 ENCOUNTER — Other Ambulatory Visit: Payer: Self-pay | Admitting: Family Medicine

## 2019-07-26 ENCOUNTER — Encounter: Payer: Medicare Other | Admitting: Family Medicine

## 2019-08-09 ENCOUNTER — Telehealth: Payer: Self-pay | Admitting: Family Medicine

## 2019-08-09 MED ORDER — PREDNISONE 20 MG PO TABS: ORAL_TABLET | ORAL | 0 refills | Status: DC

## 2019-08-09 NOTE — Telephone Encounter (Signed)
Is he swelling with fluid or chest congestion (heart failure), wheezing more(copd), purulent sputum (infection), or does he feel ok?  This will determine advice.  If just wheezing, would increase prednisone for a short burst.

## 2019-08-09 NOTE — Telephone Encounter (Signed)
Pt called and states that he can not keep his o2 up. Resting is 92-93% and when he is up moving it drops to the high 70"s. He is having no other symptoms. Denies more then usual SOB, no fever, no cough. Does not want to come in unless absolutely necessary.

## 2019-08-09 NOTE — Telephone Encounter (Signed)
Pt is not having any symptoms other then o2 low. Per Dr. Dennard Schaumann do a prednisone taper dose and then go back to regular dosage. Use albuterol before doing any strenuous activity. Left message on vm the providers recommendations and med sent to pharm.

## 2019-08-27 ENCOUNTER — Other Ambulatory Visit: Payer: Self-pay | Admitting: Family Medicine

## 2019-09-04 DIAGNOSIS — H10413 Chronic giant papillary conjunctivitis, bilateral: Secondary | ICD-10-CM | POA: Diagnosis not present

## 2019-09-06 ENCOUNTER — Other Ambulatory Visit (HOSPITAL_COMMUNITY): Payer: Self-pay | Admitting: Internal Medicine

## 2019-09-15 DIAGNOSIS — J449 Chronic obstructive pulmonary disease, unspecified: Secondary | ICD-10-CM | POA: Diagnosis not present

## 2019-09-18 ENCOUNTER — Other Ambulatory Visit (HOSPITAL_COMMUNITY): Payer: Self-pay | Admitting: Internal Medicine

## 2019-09-18 ENCOUNTER — Other Ambulatory Visit: Payer: Self-pay | Admitting: Family Medicine

## 2019-09-18 NOTE — Telephone Encounter (Signed)
Ok to refill??  Last office visit 05/28/2019.  Last refill 06/28/2019, #2 refills.

## 2019-09-24 NOTE — Progress Notes (Signed)
@Patient  ID: Bruce Travis, male    DOB: 08-02-1935, 85 y.o.   MRN: [id number]  Chief Complaint  Patient presents with  . Follow-up    Referring provider: Susy Frizzle, MD  HPI: 85 year old male, former smoker quit 1990 (70 pack year hx). PMH significant for COPD GOLD II, laryngeal chronic respiratory failure, multiple pulmonary nodules, allergic rhinitis, cardiomyopathy, CAD, hypertension, GERD, CKD. Patient of Dr. Melvyn Novas, last seen on 06/25/19. Maintained on Symbicort 160, Spiriva respimat, Singulair and prn Albuterol hfa/neb. On chronic oxygen at 4L.   09/25/2019 Patient presents today for 3 month follow-up. Reports increased shortness of breath for the last 2-3 months. Notices this more on exertion. He manages his breathing by slowing down. Feels he needs to go back to rehab but it shut down d/t covid. Unable to walk outside in the cold or heat. He walks back and forth in his house, he may consider going to silver sneakers. Uses albuterol when he needs it, approx once every 2-3 days. He reports improvement after use. Baseline oxygen requirements between 4-6 L oxygen. Currently on 4L 90%. His home machine goes up to 10 L. Reports chronic cough and nasal congestion. Most of the time his mucus is clear, rarely has some yellow/green. Takes mucinex twice a day. Using hycodan cough syrup sparingly every 3-4 weeks. Using Asetlin nasal spray at night, does not feel it is helping much.    Pulmonary function testing: - PFT's 06/08/11  FEV1  1.71 (67%) ratio 37 and no better p B2 with DLC0 54% - PFT's  04/12/2017  FEV1 1.62  (65 % ) ratio 43  p 6 % improvement from saba p spiriva  prior to study with DLCO  24/23 % corrects to 27  % for alv volume    Imaging: - Chest CT w/o contrast 02/22/17 -Bilateral lower lobe bronchiectasis with bilateral lower lobe tree-in-bud opacities compatible with infection. Mucous fills multiple bilateral lower lobe bronchi. 4 x 3.5 x 2 cm confluent peripheral opacity within the  posterior left lower lobe is suggestive of atelectasis. > cleared on cxr 07/11/2017   - CXR 06/25/2019 no serial changes LLL or elsewhere   Allergies  Allergen Reactions  . Codeine Other (See Comments)    Thought head was going to blow off/ severe headache  . Gluten Meal Other (See Comments)    Celiac disease  . Adhesive [Tape] Other (See Comments)    Band-aids - tears skin off    Immunization History  Administered Date(s) Administered  . H1N1 07/30/2008  . Influenza Split 05/24/2011  . Influenza Whole 06/23/2009, 06/02/2010, 05/23/2012  . Influenza, High Dose Seasonal PF 05/23/2017, 05/24/2018  . Influenza,inj,Quad PF,6+ Mos 05/11/2013, 05/21/2014, 05/12/2015, 05/19/2016  . Influenza-Unspecified 03/29/2018, 05/04/2019  . Moderna SARS-COVID-2 Vaccination 09/10/2019  . Pneumococcal Conjugate-13 08/14/2013  . Pneumococcal Polysaccharide-23 10/28/2014  . Td 03/20/2009  . Tdap 04/17/2012  . Zoster Recombinat (Shingrix) 04/27/2018, 07/07/2018    Past Medical History:  Diagnosis Date  . Allergic rhinitis   . Anxiety   . Bronchiectasis   . CAD (coronary artery disease)   . Celiac disease   . Chronic heart failure ([place])   . Chronic kidney disease (CKD), stage III (moderate)   . COPD (chronic obstructive pulmonary disease) ([place])   . Diverticulosis of colon (without mention of hemorrhage)   . Esophageal reflux   . Family history of adverse reaction to anesthesia    daughter gets PONV  . Family hx of colon cancer   .  History of stomach ulcers 1980s  . Hyperlipemia   . Hypertension   . 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 Myocardial infarction [facility])    "previous MI/echo in 12/2015"  . Nephrolithiasis    "got them now; never had OR/scopes" (02/03/2016)  . On home oxygen therapy    "3L-5L; 24/7" (12/29/2017)  . Other diseases of lung, not elsewhere classified   . Personal history of colonic polyps 04/26/2007   hyperplastic   . Pneumonia "several  times"    Tobacco History: Social History   Tobacco Use  Smoking Status Former Smoker  . Packs/day: 2.00  . Years: 35.00  . Pack years: 70.00  . Types: Cigarettes  . Quit date: 01/21/1989  . Years since quitting: 30.6  Smokeless Tobacco Former Systems developer  . Types: Chew  . Quit date: 08/23/2001   Counseling given: Not Answered   Outpatient Medications Prior to Visit  Medication Sig Dispense Refill  . acetaminophen (TYLENOL) 325 MG tablet Take 2 tablets (650 mg total) by mouth every 6 (six) hours as needed for fever.    Marland Kitchen albuterol (PROVENTIL HFA;VENTOLIN HFA) 108 (90 Base) MCG/ACT inhaler Inhale 2 puffs into the lungs every 4 (four) hours as needed for wheezing or shortness of breath. 1 Inhaler 0  . albuterol (PROVENTIL) (2.5 MG/3ML) 0.083% nebulizer solution Take 3 mLs (2.5 mg total) by nebulization every 4 (four) hours as needed for wheezing or shortness of breath. 360 mL 0  . ALPRAZolam (XANAX) 0.25 MG tablet TAKE ONE TABLET BY MOUTH TWICE A DAY AS NEEDED FOR ANXIETY. 30 tablet 2  . amiodarone (PACERONE) 200 MG tablet TAKE 1/2 TABLET BY MOUTH ONCE DAILY. 45 tablet 0  . aspirin EC 81 MG tablet Take 81 mg by mouth daily.    Marland Kitchen atorvastatin (LIPITOR) 20 MG tablet TAKE ONE TABLET BY MOUTH ONCE DAILY. 90 tablet 0  . azelastine (ASTELIN) 0.1 % nasal spray USE 2 SPRAYS IN EACH NOSTRILS TWICE DAILY AS DIRECTED. 30 mL 0  . calcitonin, salmon, (MIACALCIN/FORTICAL) 200 UNIT/ACT nasal spray Place 1 spray into alternate nostrils daily. Every other day 3.7 mL 0  . Calcium Carbonate-Vitamin D (CALTRATE 600+D) 600-400 MG-UNIT tablet Take 1 tablet by mouth daily.     Marland Kitchen dextromethorphan-guaiFENesin (MUCINEX DM) 30-600 MG 12hr tablet Take 1 tablet by mouth 2 (two) times daily.    Marland Kitchen doxycycline (VIBRA-TABS) 100 MG tablet Take 1 tablet (100 mg total) by mouth 2 (two) times daily. 14 tablet 0  . ENTRESTO 49-51 MG TAKE (1) TABLET BY MOUTH TWICE DAILY. 180 tablet 0  . HYDROcodone-acetaminophen (NORCO/VICODIN)  5-325 MG tablet Take 1 tablet by mouth every 6 (six) hours as needed for moderate pain. 20 tablet 0  . HYDROcodone-homatropine (HYCODAN) 5-1.5 MG/5ML syrup TAKE 2.5 TO 5 ML BY MOUTH EVERY 8 HOURS AS NEEDED FOR COUGH. DO NOT TAKE AT THE SAME TIME AS XANAX. 90 mL 0  . LUTEIN PO Take 1 capsule by mouth daily.    . meclizine (ANTIVERT) 25 MG tablet TAKE (1) TABLET BY MOUTH THREE TIMES DAILY AS NEEDED FOR DIZZINESS. 30 tablet 2  . montelukast (SINGULAIR) 10 MG tablet TAKE 1 TABLET BY MOUTH AT BEDTIME. 90 tablet 3  . Multiple Vitamins-Minerals (CENTRUM SILVER 50+MEN) TABS Take 1 tablet by mouth daily.    . NON FORMULARY Diet Type:  Gluten Free    . OXYGEN Inhale 4-6 L/min into the lungs continuous. To maintain sats > 88%    .  pantoprazole (PROTONIX) 40 MG tablet TAKE ONE TABLET BY MOUTH DAILY. 30 tablet 0  . polyethylene glycol (MIRALAX / GLYCOLAX) packet Take 17 g by mouth daily as needed for mild constipation. Mix in 8 oz liquid and drink    . predniSONE (DELTASONE) 5 MG tablet TAKE 1 TABLET BY MOUTH DAILY WITH BREAKFAST. 30 tablet 0  . SPIRIVA RESPIMAT 2.5 MCG/ACT AERS INHALE 2 PUFFS ONCE DAILY AS DIRECTED. 4 g 5  . spironolactone (ALDACTONE) 25 MG tablet TAKE (1/2) TABLET BY MOUTH DAILY. 45 tablet 0  . SYMBICORT 160-4.5 MCG/ACT inhaler INHALE 2 PUFFS INTO THE LUNGS EVERY 12 HOURS. 10.2 g 11  . tamsulosin (FLOMAX) 0.4 MG CAPS capsule TAKE ONE CAPSULE BY MOUTH ONCE DAILY. 90 capsule 3  . predniSONE (DELTASONE) 20 MG tablet 3 tabs po qd x 2 days, 2 tabs po qd x 2 days, 1 tab po qd x 2 days 12 tablet 0   No facility-administered medications prior to visit.   Review of Systems  Review of Systems  HENT: Positive for congestion and postnasal drip.   Respiratory: Positive for cough and shortness of breath. Negative for chest tightness and wheezing.    Physical Exam  BP 104/60 (BP Location: Left Arm, Cuff Size: Normal)   Pulse 95   Temp 98.2 F (36.8 C) (Temporal)   Ht 5' 6"  (1.676 m)   Wt 127  lb (57.6 kg)   SpO2 90% Comment: 4L  BMI 20.50 kg/m  Physical Exam Constitutional:      General: He is not in acute distress.    Appearance: Normal appearance. He is not ill-appearing.  Cardiovascular:     Rate and Rhythm: Normal rate and regular rhythm.  Pulmonary:     Effort: Pulmonary effort is normal.     Breath sounds: Normal breath sounds. No wheezing or rhonchi.     Comments: CTA; O2 90% on 4L continuous  Neurological:     Mental Status: He is alert.  Psychiatric:        Mood and Affect: Mood normal.        Behavior: Behavior normal.        Thought Content: Thought content normal.        Judgment: Judgment normal.     Lab Results:  CBC    Component Value Date/Time   WBC 8.5 06/26/2019 0813   RBC 4.41 06/26/2019 0813   HGB 14.6 06/26/2019 0813   HCT 43.7 06/26/2019 0813   PLT 167 06/26/2019 0813   MCV 99.1 06/26/2019 0813   MCH 33.1 (H) 06/26/2019 0813   MCHC 33.4 06/26/2019 0813   RDW 12.4 06/26/2019 0813   LYMPHSABS 1,360 06/26/2019 0813   MONOABS 1.1 (H) 10/26/2018 0700   EOSABS 791 (H) 06/26/2019 0813   BASOSABS 43 06/26/2019 0813    BMET    Component Value Date/Time   NA 144 06/26/2019 0813   K 4.7 06/26/2019 0813   CL 108 06/26/2019 0813   CO2 27 06/26/2019 0813   GLUCOSE 76 06/26/2019 0813   BUN 29 (H) 06/26/2019 0813   CREATININE 1.14 (H) 06/26/2019 0813   CALCIUM 9.2 06/26/2019 0813   GFRNONAA >60 10/26/2018 0700   GFRNONAA 55 (L) 04/25/2018 0832   GFRAA >60 10/26/2018 0700   GFRAA 63 04/25/2018 0832    BNP    Component Value Date/Time   BNP 47 01/09/2018 1032    ProBNP    Component Value Date/Time   PROBNP 210.0 07/05/2014 1104  Imaging: No results found.   Assessment & Plan:   COPD GOLD II  - Increased shortness of breath x 2-3 months - Trial Trelegy 100 one puff once daily (RX sent) - Continue Albuterol hfa two puffs every 4-6 hours for sob/wheezing - FU in 2 week televisit to assess response to new inhaler    Allergic rhinitis - Recommend flonase 1-2 spray per nostril at bedtime  - Can continue Astelin as needed   Chronic respiratory failure with hypoxia ([place]) - Continue 4-6L to keep O2 >88-90%   Martyn Ehrich, NP 09/25/2019

## 2019-09-25 ENCOUNTER — Ambulatory Visit: Payer: Medicare Other | Admitting: Internal Medicine

## 2019-09-25 ENCOUNTER — Ambulatory Visit: Payer: Medicare Other | Admitting: Primary Care

## 2019-09-25 ENCOUNTER — Other Ambulatory Visit: Payer: Self-pay

## 2019-09-25 ENCOUNTER — Encounter: Payer: Self-pay | Admitting: Primary Care

## 2019-09-25 DIAGNOSIS — J449 Chronic obstructive pulmonary disease, unspecified: Secondary | ICD-10-CM

## 2019-09-25 DIAGNOSIS — J9611 Chronic respiratory failure with hypoxia: Secondary | ICD-10-CM

## 2019-09-25 MED ORDER — TRELEGY ELLIPTA 100-62.5-25 MCG/INH IN AEPB: 1.0000 | INHALATION_SPRAY | Freq: Every day | RESPIRATORY_TRACT | 5 refills | Status: AC

## 2019-09-25 MED ORDER — FLUTICASONE PROPIONATE 50 MCG/ACT NA SUSP: 1.0000 | Freq: Every day | NASAL | 5 refills | Status: AC

## 2019-09-25 NOTE — Assessment & Plan Note (Signed)
-   Recommend flonase 1-2 spray per nostril at bedtime  - Can continue Astelin as needed

## 2019-09-25 NOTE — Patient Instructions (Addendum)
Recommendations: - Start TRELEGY- take 1 puffs once daily in the morning (rinse mouth out) - Stop Symbicort and Sprivia   - You can still use your Albuterol resue inhaler two puffs every 6 hours for shortness of breath/wheezing  - Try Flonase nasal spray 1-2 spray per nostril at bedtime (if not better than go back to Astelin- you can use BOTH if needed) - Continue Mucinex twice daily  - Stay as active as possible, recommend returning to silver sneakers or rehab when able   Oxygen: - Continue 4-6 L oxygen to keep O2 >88-90% - Ask Nichola Sizer (oxygen service)- what device he was speak about that helps with nocturnal congestion   Follow-up: - 2 week televisit/phone with Eustaquio Maize NP

## 2019-09-25 NOTE — Assessment & Plan Note (Addendum)
-   Increased shortness of breath x 2-3 months - Trial Trelegy 100 one puff once daily (RX sent) - Continue Albuterol hfa two puffs every 4-6 hours for sob/wheezing - FU in 2 week televisit to assess response to new inhaler

## 2019-09-25 NOTE — Addendum Note (Signed)
Addended by: Parke Poisson E on: 09/25/2019 01:12 PM   Modules accepted: Orders

## 2019-09-25 NOTE — Assessment & Plan Note (Signed)
-   Continue 4-6L to keep O2 >88-90%

## 2019-10-01 ENCOUNTER — Telehealth: Payer: Self-pay | Admitting: Primary Care

## 2019-10-01 DIAGNOSIS — J449 Chronic obstructive pulmonary disease, unspecified: Secondary | ICD-10-CM | POA: Diagnosis not present

## 2019-10-01 NOTE — Telephone Encounter (Signed)
ATC pt to see if this is something that he is wanting. I did not get an answer and I could not leave a message. Will try back.

## 2019-10-02 ENCOUNTER — Telehealth: Payer: Self-pay | Admitting: Primary Care

## 2019-10-02 DIAGNOSIS — J479 Bronchiectasis, uncomplicated: Secondary | ICD-10-CM

## 2019-10-02 NOTE — Telephone Encounter (Signed)
FYI

## 2019-10-02 NOTE — Telephone Encounter (Signed)
ATC pt, no answer. Left message for pt to call back.  

## 2019-10-02 NOTE — Telephone Encounter (Signed)
I called and spoke with the pt  He did not know anything about a vest and did not want this  Nothing further needed

## 2019-10-03 ENCOUNTER — Other Ambulatory Visit: Payer: Self-pay | Admitting: Family Medicine

## 2019-10-03 NOTE — Telephone Encounter (Signed)
Noted, I will give Beth paperwork today 10/03/2019.

## 2019-10-05 NOTE — Telephone Encounter (Signed)
Please advise if this paperwork has been taken care of? Thanks.

## 2019-10-08 DIAGNOSIS — J479 Bronchiectasis, uncomplicated: Secondary | ICD-10-CM | POA: Insufficient documentation

## 2019-10-08 NOTE — Telephone Encounter (Signed)
Bronchiectasis has been added to the pt's problem list.  Spoke with pt. He states that he currently has a flutter valve and is taking Mucinex daily. Pt has an appointment with Beth on 10/09/19. Nothing further was needed.

## 2019-10-08 NOTE — Telephone Encounter (Signed)
OK. Can you add bronchiectasis to problem list. I'm not sure vest will be covered, usually need to try and fail mucinex and flutter valve If not doing either please advise mucinex 617m twice daily and get patient flutter valve to use three times a day. Follow-up in 4 weeks if he doesn't have an apt set up yet already.

## 2019-10-08 NOTE — Telephone Encounter (Signed)
Yes Ill give it to [facility]. Can you make sure there is a dx for bronchiectasis. CT in 2018 showed bilateral lower lobe bronchiectasis

## 2019-10-08 NOTE — Telephone Encounter (Signed)
Can you find out if he has used a flutter valve before???

## 2019-10-08 NOTE — Telephone Encounter (Signed)
Beth, please advise if the vest form was signed by you, thanks

## 2019-10-08 NOTE — Telephone Encounter (Signed)
According to the record he has never used one, not in his med hx or under the problem list

## 2019-10-09 ENCOUNTER — Encounter: Payer: Self-pay | Admitting: Primary Care

## 2019-10-09 ENCOUNTER — Other Ambulatory Visit: Payer: Self-pay

## 2019-10-09 ENCOUNTER — Ambulatory Visit (INDEPENDENT_AMBULATORY_CARE_PROVIDER_SITE_OTHER): Payer: Medicare PPO | Admitting: Primary Care

## 2019-10-09 DIAGNOSIS — J3089 Other allergic rhinitis: Secondary | ICD-10-CM | POA: Diagnosis not present

## 2019-10-09 DIAGNOSIS — J449 Chronic obstructive pulmonary disease, unspecified: Secondary | ICD-10-CM | POA: Diagnosis not present

## 2019-10-09 DIAGNOSIS — J9611 Chronic respiratory failure with hypoxia: Secondary | ICD-10-CM | POA: Diagnosis not present

## 2019-10-09 NOTE — Patient Instructions (Signed)
COPD: - Continue on Trelegy 1 puff daily  - Continue mucinex twice daily - Continue Flonase and Astelin nasal spray - Continue Singulair 86m at bedtime  - Use flutter valve 2-3 times a day (if this does not help congestin, let [place] and we can order therapy vest)  Follow-up: - 3 months with Dr. WMelvyn Novas

## 2019-10-09 NOTE — Progress Notes (Signed)
Virtual Visit via Telephone Note  I connected with Bruce Travis on 10/09/19 at  1:30 PM EST by telephone and verified that I am speaking with the correct person using two identifiers.  Location: Patient: Home Provider: Office   I discussed the limitations, risks, security and privacy concerns of performing an evaluation and management service by telephone and the availability of in person appointments. I also discussed with the patient that there may be a patient responsible charge related to this service. The patient expressed understanding and agreed to proceed.   History of Present Illness:  85 year old male, former smoker quit 1990 (70 pack year hx). PMH significant for COPD GOLD II, laryngeal chronic respiratory failure, multiple pulmonary nodules, allergic rhinitis, cardiomyopathy, CAD, hypertension, GERD, CKD. Patient of Dr. Melvyn Novas, last seen on 06/25/19. Maintained on Symbicort 160, Spiriva respimat, Singulair and prn Albuterol hfa/neb. On chronic oxygen at 4L.   Previous [facility] pulmonary encounter: 09/25/2019 Patient presents today for 3 month follow-up. Reports increased shortness of breath for the last 2-3 months. Notices this more on exertion. He manages his breathing by slowing down. Feels he needs to go back to rehab but it shut down d/t covid. Unable to walk outside in the cold or heat. He walks back and forth in his house, he may consider going to silver sneakers. Uses albuterol when he needs it, approx once every 2-3 days. He reports improvement after use. Baseline oxygen requirements between 4-6 L oxygen. Currently on 4L 90%. His home machine goes up to 10 L. Reports chronic cough and nasal congestion. Most of the time his mucus is clear, rarely has some yellow/green. Takes mucinex twice a day. Using hycodan cough syrup sparingly every 3-4 weeks. Using Asetlin nasal spray at night, does not feel it is helping much.   10/09/2019 Patient contacted today for 2 week follow-up. States that his  breathing is better. He has really liked taking Trelegy d/t once a day dosing. It is more convenient for him. Using Albuterol rescue inhaler once a week. His cough has improved, only compliant is post nasal drip. He is using nasal sprays as prescribed. He has not been using flutter valve because it gets up a lot of mucus. Recommend continuing mucinex and resuming flutter valve, if these do not work to relieve chest congestion we can try therapy vest.   Pulmonary function testing: - PFT's 06/08/11  FEV1  1.71 (67%) ratio 37 and no better p B2 with DLC0 54% - PFT's  04/12/2017  FEV1 1.62  (65 % ) ratio 43  p 6 % improvement from saba p spiriva  prior to study with DLCO  24/23 % corrects to 27  % for alv volume    Imaging: - Chest CT w/o contrast 02/22/17 -Bilateral lower lobe bronchiectasis with bilateral lower lobe tree-in-bud opacities compatible with infection. Mucous fills multiple bilateral lower lobe bronchi. 4 x 3.5 x 2 cm confluent peripheral opacity within the posterior left lower lobe is suggestive of atelectasis. > cleared on cxr 07/11/2017   - CXR 06/25/2019 no serial changes LLL or elsewhere    Observations/Objective:  - No shortness of breath, wheezing or cough noted  Assessment and Plan:  COPD: - Improved  - Continue on Trelegy 1 puff daily (refills already sent) - Continue Albuterol hfa two puffs every 4-6 hours for sob/wheezing  Allergic rhinitis: - Continue Flonase and Astelin nasal spray - Continue Singulair 97m at bedtime   Bronchiectasis: - Bilateral lower lobe bronchiectasis on CT  chest in 2018 - Continue mucinex twice daily - Use flutter valve 2-3 times a day (if this does not help congestin, let us know and we can order therapy vest)  Chronic respiratory failure with hypoxia: - Continue 4-6 L to keep O2 > 88-90%   Follow Up Instructions:   - FU in 3 months with Dr. Melvyn Novas  I discussed the assessment and treatment plan with the patient. The patient was provided  an opportunity to ask questions and all were answered. The patient agreed with the plan and demonstrated an understanding of the instructions.   The patient was advised to call back or seek an in-person evaluation if the symptoms worsen or if the condition fails to improve as anticipated.  I provided 18 minutes of non-face-to-face time during this encounter.   Martyn Ehrich, NP

## 2019-10-16 ENCOUNTER — Other Ambulatory Visit: Payer: Self-pay | Admitting: Family Medicine

## 2019-10-16 DIAGNOSIS — J449 Chronic obstructive pulmonary disease, unspecified: Secondary | ICD-10-CM | POA: Diagnosis not present

## 2019-10-19 ENCOUNTER — Encounter: Payer: Self-pay | Admitting: Family Medicine

## 2019-10-19 ENCOUNTER — Ambulatory Visit: Payer: Medicare PPO | Admitting: Family Medicine

## 2019-10-19 ENCOUNTER — Other Ambulatory Visit: Payer: Self-pay

## 2019-10-19 VITALS — BP 100/76 | HR 86 | Temp 98.0°F | Resp 20 | Ht 66.5 in

## 2019-10-19 DIAGNOSIS — J3489 Other specified disorders of nose and nasal sinuses: Secondary | ICD-10-CM | POA: Diagnosis not present

## 2019-10-19 NOTE — Progress Notes (Signed)
Subjective:    Patient ID: Bruce Travis, male    DOB: 02-Jan-1935, 85 y.o.   MRN: [id number]  HPI  Patient was originally being seen as a telephone visit.  Phone call began at 930.  Phone call concluded at 940.  He consented to be seen over the telephone.  He states that for the last 2 months, his right nostril has failed like it was stopped up.  He states that there is something painful and sore in the back of his nostril that he is not able to get to.  He cannot reach it with his finger.  It feels occluded.  He occasionally has some mild epistaxis.  Symptoms have been present for 2 months.  He denies any pain or pressure in his frontal sinuses or maxillary sinuses.  He denies any rhinorrhea or congestion.  He has been using Astelin as well as Flonase but not having any success in his right nostril.  I explained to the patient that I was uncertain of what was the cause and that I would need to examine him.  Therefore I requested that he dropped to my office to allow me to perform a physical exam.  The patient arrived at 1015 and I began examining him.  The right nostril is occluded 50% with a hard dried piece of mucus and debris (booger).  It is lodged deep in his right nostril behind the turbinate.  Using a speculum and an ear curette I gently was able to move the hard dried debris further forward or I could then grasp it with an alligator forcep and remove the debris completely opening his right nostril.  Patient states he immediately feels better.  The debris was large and hard and had adherent mucus and blood.  Total size was roughly the size of a garden P with a trail of dried mucus approximately 2 cm long.  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 Waunita Schooner)   CARDIAC CATHETERIZATION  05/04/2001   same at 2011 cath (Dr. Waunita Schooner)   CARDIAC CATHETERIZATION N/A 02/03/2016   Procedure: Right/Left Heart Cath and Coronary Angiography;  Surgeon: Pixie Casino, MD;  Location: [place] [facility];  Service: Cardiovascular;  Laterality: N/A;  EPIGLOTOPLASTY W/ MLB     removal due to carcinoma   EXCISIONAL HEMORRHOIDECTOMY  2000s   HAND SURGERY Right 1958   d/t crush injury   TONSILLECTOMY AND ADENOIDECTOMY     ULTRASOUND GUIDANCE FOR VASCULAR ACCESS  02/03/2016   Procedure: Ultrasound Guidance For Vascular Access;  Surgeon: Pixie Casino, MD;  Location: [place] [facility];  Service: Cardiovascular;;   VASECTOMY     Current Outpatient Medications on File Prior to Visit  Medication Sig Dispense Refill   acetaminophen (TYLENOL) 325 MG tablet Take 2 tablets (650 mg total) by mouth every 6 (six) hours as needed for fever.     albuterol (PROVENTIL) (2.5 MG/3ML) 0.083% nebulizer solution Take 3 mLs (2.5 mg total) by nebulization every 4 (four) hours as needed for wheezing  or shortness of breath. 360 mL 0   albuterol (VENTOLIN HFA) 108 (90 Base) MCG/ACT inhaler INHALE 2 PUFFS EVERY 4 HOURS AS NEEDED FOR SHORTNESS OF BREATH. 25.5 g 0   ALPRAZolam (XANAX) 0.25 MG tablet TAKE ONE TABLET BY MOUTH TWICE A DAY AS NEEDED FOR ANXIETY. 30 tablet 2   amiodarone (PACERONE) 200 MG tablet TAKE 1/2 TABLET BY MOUTH ONCE DAILY. 45 tablet 0   aspirin EC 81 MG tablet Take 81 mg by mouth daily.     atorvastatin (LIPITOR) 20 MG tablet TAKE ONE TABLET BY MOUTH ONCE DAILY. 90 tablet 0   azelastine (ASTELIN) 0.1 % nasal spray USE 2 SPRAYS IN EACH NOSTRILS TWICE DAILY AS DIRECTED. 30 mL 0   calcitonin, salmon, (MIACALCIN/FORTICAL) 200 UNIT/ACT nasal spray Place 1 spray into alternate nostrils daily. Every other day 3.7 mL 0   Calcium Carbonate-Vitamin D (CALTRATE 600+D) 600-400 MG-UNIT tablet Take 1 tablet by mouth daily.      dextromethorphan-guaiFENesin (MUCINEX DM) 30-600 MG 12hr tablet Take 1 tablet by mouth 2 (two) times daily.     ENTRESTO 49-51 MG TAKE (1) TABLET BY MOUTH TWICE DAILY. 180 tablet 0   fluticasone (FLONASE) 50 MCG/ACT nasal spray Place 1-2 sprays into both nostrils at bedtime. 18.2 mL 5   Fluticasone-Umeclidin-Vilant (TRELEGY ELLIPTA) 100-62.5-25 MCG/INH AEPB Inhale 1 puff into the lungs daily. 60 each 5   HYDROcodone-acetaminophen (NORCO/VICODIN) 5-325 MG tablet Take 1 tablet by mouth every 6 (six) hours as needed for moderate pain. 20 tablet 0   LUTEIN PO Take 1 capsule by mouth daily.     meclizine (ANTIVERT) 25 MG tablet TAKE (1) TABLET BY MOUTH THREE TIMES DAILY AS NEEDED FOR DIZZINESS. 30 tablet 2   montelukast (SINGULAIR) 10 MG tablet TAKE 1 TABLET BY MOUTH AT BEDTIME. 90 tablet 3   Multiple Vitamins-Minerals (CENTRUM SILVER 50+MEN) TABS Take 1 tablet by mouth daily.     NON FORMULARY Diet Type:  Gluten Free     OXYGEN Inhale 4-6 L/min into the lungs continuous. To maintain sats > 88%     pantoprazole (PROTONIX) 40 MG tablet TAKE ONE TABLET  BY MOUTH DAILY. 30 tablet 0   polyethylene glycol (MIRALAX / GLYCOLAX) packet Take 17 g by mouth daily as needed for mild constipation. Mix in 8 oz liquid and drink     predniSONE (DELTASONE) 5 MG tablet TAKE 1 TABLET BY MOUTH DAILY WITH BREAKFAST. 30 tablet 0   spironolactone (ALDACTONE) 25 MG tablet TAKE (1/2) TABLET BY MOUTH DAILY. 45 tablet 0   tamsulosin (FLOMAX) 0.4 MG CAPS capsule TAKE ONE CAPSULE BY MOUTH ONCE DAILY. 90 capsule 3   No current facility-administered medications on file prior  to visit.   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  Tobacco Use   Smoking status: Former Smoker    Packs/day: 2.00    Years: 35.00    Pack years: 70.00    Types: Cigarettes    Quit date: 01/21/1989    Years since quitting: 30.7   Smokeless tobacco: Former Systems developer    Types: [place] date: 08/23/2001  Substance and Sexual Activity   Alcohol use: No   Drug use: No   Sexual activity: Not on file  Other Topics Concern   Not on file  Social History Narrative   Not on file   Social Determinants of Health   Financial Resource Strain:    Difficulty of Paying Living Expenses: Not on file  Food Insecurity:    Worried About Charity fundraiser in the Last Year: Not on file   [employer] of Food in the Last Year: Not on file  Transportation Needs:    Lack of Transportation (Medical): Not on file   Lack of Transportation (Non-Medical): Not on file  Physical Activity:    Days of  Exercise per Week: Not on file   Minutes of Exercise per Session: Not on file  Stress:    Feeling of Stress : Not on file  Social Connections:    Frequency of Communication with Friends and Family: Not on file   Frequency of Social Gatherings with Friends and Family: Not on file   Attends Religious Services: Not on file   Active Member of Clubs or Organizations: Not on file   Attends Archivist Meetings: Not on file   Marital Status: Not on file  Intimate Partner Violence:    Fear of Current or Ex-Partner: Not on file   Emotionally Abused: Not on file   Physically Abused: Not on file   Sexually Abused: Not on file     Review of Systems  All other systems reviewed and are negative.      Objective:   Physical Exam Vitals reviewed.  Constitutional:      General: He is not in acute distress.    Appearance: Normal appearance. He is normal weight. He is not ill-appearing or toxic-appearing.  HENT:     Nose: Congestion present. No nasal deformity, septal deviation, signs of injury, laceration, nasal tenderness or mucosal edema.     Right Nostril: Foreign body and occlusion present.     Right Turbinates: Not enlarged, swollen or pale.     Right Sinus: No maxillary sinus tenderness or frontal sinus tenderness.  Cardiovascular:     Rate and Rhythm: Normal rate.  Pulmonary:     Effort: Pulmonary effort is normal.     Breath sounds: Normal breath sounds.  Neurological:     Mental Status: He is alert.           Assessment &  Plan:  Nasal obstruction  I was successfully able to remove the obstruction using an ear curette nasal speculum.  Recommended patient use a Nettie pot in the future if symptoms develop.  I believe that his routine use of oxygen via nasal cannula is drying his mucus causing the formation of a large hard obstructed mucus ball

## 2019-10-22 ENCOUNTER — Other Ambulatory Visit: Payer: Self-pay | Admitting: Family Medicine

## 2019-10-22 ENCOUNTER — Other Ambulatory Visit: Payer: Self-pay | Admitting: Adult Health

## 2019-10-23 ENCOUNTER — Other Ambulatory Visit: Payer: Self-pay | Admitting: Family Medicine

## 2019-10-25 ENCOUNTER — Telehealth: Payer: Self-pay | Admitting: *Deleted

## 2019-10-25 NOTE — Telephone Encounter (Signed)
Received call from patient.   Reports that he continues to have nasal congestion. Reports that R nare is worse than the L side. Reports that he has no fever, no worsening cough, no sinus pressure, no HA, no ear pressure.   Advised that prior appointment noted to have congestion D/T nasal pug of mucus.   MD made aware and advised to use Afrin nasal spray to reduce swelling/ inflammation, then F/U with Flonase after 30 minutes. Advised to not use Afrin >3 days. Advised to continue OTC nasal saline.   Reports that he is unable to tolerate nettie pot.   Advised if Sx do not improve by 10/29/2019, contact office for appointment.

## 2019-10-26 ENCOUNTER — Ambulatory Visit (HOSPITAL_COMMUNITY): Payer: Medicare PPO

## 2019-10-26 ENCOUNTER — Encounter (HOSPITAL_COMMUNITY): Payer: Medicare Other | Admitting: Internal Medicine

## 2019-10-29 DIAGNOSIS — J449 Chronic obstructive pulmonary disease, unspecified: Secondary | ICD-10-CM | POA: Diagnosis not present

## 2019-10-31 ENCOUNTER — Other Ambulatory Visit: Payer: Self-pay | Admitting: Family Medicine

## 2019-11-05 ENCOUNTER — Other Ambulatory Visit: Payer: Self-pay

## 2019-11-05 ENCOUNTER — Encounter: Payer: Self-pay | Admitting: Family Medicine

## 2019-11-05 ENCOUNTER — Ambulatory Visit: Payer: Medicare PPO | Admitting: Family Medicine

## 2019-11-05 VITALS — BP 170/70 | HR 82 | Temp 96.3°F | Resp 20 | Ht 66.5 in | Wt 126.0 lb

## 2019-11-05 DIAGNOSIS — J019 Acute sinusitis, unspecified: Secondary | ICD-10-CM | POA: Diagnosis not present

## 2019-11-05 MED ORDER — AMOXICILLIN-POT CLAVULANATE 875-125 MG PO TABS: 1.0000 | ORAL_TABLET | Freq: Two times a day (BID) | ORAL | 0 refills | Status: DC

## 2019-11-05 NOTE — Progress Notes (Signed)
Subjective:    Patient ID: Bruce Travis, male    DOB: 1934/10/29, 85 y.o.   MRN: [id number]  HPI 10/19/19 Patient was originally being seen as a telephone visit.  Phone call began at 930.  Phone call concluded at 940.  He consented to be seen over the telephone.  He states that for the last 2 months, his right nostril has failed like it was stopped up.  He states that there is something painful and sore in the back of his nostril that he is not able to get to.  He cannot reach it with his finger.  It feels occluded.  He occasionally has some mild epistaxis.  Symptoms have been present for 2 months.  He denies any pain or pressure in his frontal sinuses or maxillary sinuses.  He denies any rhinorrhea or congestion.  He has been using Astelin as well as Flonase but not having any success in his right nostril.  I explained to the patient that I was uncertain of what was the cause and that I would need to examine him.  Therefore I requested that he dropped to my office to allow me to perform a physical exam.  The patient arrived at 1015 and I began examining him.  The right nostril is occluded 50% with a hard dried piece of mucus and debris (booger).  It is lodged deep in his right nostril behind the turbinate.  Using a speculum and an ear curette I gently was able to move the hard dried debris further forward or I could then grasp it with an alligator forcep and remove the debris completely opening his right nostril.  Patient states he immediately feels better.  The debris was large and hard and had adherent mucus and blood.  Total size was roughly the size of a garden P with a trail of dried mucus approximately 2 cm long.  Symptoms immediately resolved.  At that time, my plan was: I was successfully able to remove the obstruction using an ear curette nasal speculum.  Recommended patient use a Nettie pot in the future if symptoms develop.  I believe that his routine use of oxygen via nasal cannula is drying his  mucus causing the formation of a large hard obstructed mucus ball  11/05/19 Patient called back shortly thereafter a few days later and stated that the obstruction had reoccurred in both nostrils.  We recommended that he use his nasal saline spray 3-4 times a day.  He can add Afrin to help reduce swelling in the nostrils and continue his Astelin.  However he reports that none of this is helped.  He now has pain and pressure in his nasal bridge and onto the cheekbones of his face.  Examination of his nostrils show bilateral obstruction with thick hard mucus and snot.  Most likely the patient's significant oxygen requirement (6 L via nasal cannula) is drying out his nasal mucosa and nasal mucus leading to a hard obstruction of dried mucus.  He is using the nasal saline 3-4 times a day without relief and he is now developing pain and pressure in his sinuses which may be the cause of the rhinorrhea.  Also believe some of this is likely a he has the patient feels anxious when he is unable to breathe through his nose given the fact that he is oxygen dependent on his nasal cannula.  Patient is unable to use a humidifier because the pressure of the oxygen blows water into his  nostrils "drowning him".  He was also unable to tolerate a Nettie pot because it would drain down his throat and choke him. Past Medical History:  Diagnosis Date  . Allergic rhinitis   . Anxiety   . Bronchiectasis   . CAD (coronary artery disease)   . Celiac disease   . Chronic heart failure ([place])   . Chronic kidney disease (CKD), stage III (moderate)   . COPD (chronic obstructive pulmonary disease) ([place])   . Diverticulosis of colon (without mention of hemorrhage)   . Esophageal reflux   . Family history of adverse reaction to anesthesia    daughter gets PONV  . Family hx of colon cancer   . History of stomach ulcers 1980s  . Hyperlipemia   . Hypertension   . 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  Myocardial infarction [facility])    "previous MI/echo in 12/2015"  . Nephrolithiasis    "got them now; never had OR/scopes" (02/03/2016)  . On home oxygen therapy    "3L-5L; 24/7" (12/29/2017)  . Other diseases of lung, not elsewhere classified   . Personal history of colonic polyps 04/26/2007   hyperplastic   . Pneumonia "several times"   Past Surgical History:  Procedure Laterality Date  . CARDIAC CATHETERIZATION  02/03/2016  . CARDIAC CATHETERIZATION  09/30/2009   non-obstructive CAD w/30% narrowing in prox LAD (Dr. Waunita Schooner)  . CARDIAC CATHETERIZATION  05/04/2001   same at 2011 cath (Dr. Waunita Schooner)  . CARDIAC CATHETERIZATION N/A 02/03/2016   Procedure: Right/Left Heart Cath and Coronary Angiography;  Surgeon: Pixie Casino, MD;  Location: [place] [facility];  Service: Cardiovascular;  Laterality: N/A;  . EPIGLOTOPLASTY W/ MLB     removal due to carcinoma  . EXCISIONAL HEMORRHOIDECTOMY  2000s  . HAND SURGERY Right 1958   d/t crush injury  . TONSILLECTOMY AND ADENOIDECTOMY    . ULTRASOUND GUIDANCE FOR VASCULAR ACCESS  02/03/2016   Procedure: Ultrasound Guidance For Vascular Access;  Surgeon: Pixie Casino, MD;  Location: [place] [facility];  Service: Cardiovascular;;  . VASECTOMY     Current Outpatient Medications on File Prior to Visit  Medication Sig Dispense Refill  . acetaminophen (TYLENOL) 325 MG tablet Take 2 tablets (650 mg total) by mouth every 6 (six) hours as needed for fever.    Marland Kitchen albuterol (PROVENTIL) (2.5 MG/3ML) 0.083% nebulizer solution INHALE 1 VIAL VIA NEBULIZER EVERY 4 HOURS AS NEEDED FOR WHEEZING OR SHORTNESS OF BREATH. 525 mL 0  . albuterol (VENTOLIN HFA) 108 (90 Base) MCG/ACT inhaler INHALE 2 PUFFS EVERY 4 HOURS AS NEEDED FOR SHORTNESS OF BREATH. 25.5 g 0  . ALPRAZolam (XANAX) 0.25 MG tablet TAKE ONE TABLET BY MOUTH TWICE A DAY AS NEEDED FOR ANXIETY. 30 tablet 2  . amiodarone (PACERONE) 200 MG tablet TAKE 1/2 TABLET BY MOUTH ONCE DAILY. 45 tablet 0  . aspirin EC 81  MG tablet Take 81 mg by mouth daily.    Marland Kitchen atorvastatin (LIPITOR) 20 MG tablet TAKE ONE TABLET BY MOUTH ONCE DAILY. 90 tablet 0  . azelastine (ASTELIN) 0.1 % nasal spray USE 2 SPRAYS IN EACH NOSTRILS TWICE DAILY AS DIRECTED. 30 mL 0  . calcitonin, salmon, (MIACALCIN/FORTICAL) 200 UNIT/ACT nasal spray Place 1 spray into alternate nostrils daily. Every other day 3.7 mL 0  . Calcium Carbonate-Vitamin D (CALTRATE 600+D) 600-400 MG-UNIT tablet Take 1 tablet by mouth daily.     Marland Kitchen dextromethorphan-guaiFENesin ([place] DM) 30-600 MG  12hr tablet Take 1 tablet by mouth 2 (two) times daily.    Marland Kitchen ENTRESTO 49-51 MG TAKE (1) TABLET BY MOUTH TWICE DAILY. 180 tablet 0  . fluticasone (FLONASE) 50 MCG/ACT nasal spray Place 1-2 sprays into both nostrils at bedtime. 18.2 mL 5  . Fluticasone-Umeclidin-Vilant (TRELEGY ELLIPTA) 100-62.5-25 MCG/INH AEPB Inhale 1 puff into the lungs daily. 60 each 5  . HYDROcodone-acetaminophen (NORCO/VICODIN) 5-325 MG tablet Take 1 tablet by mouth every 6 (six) hours as needed for moderate pain. 20 tablet 0  . LUTEIN PO Take 1 capsule by mouth daily.    . meclizine (ANTIVERT) 25 MG tablet TAKE (1) TABLET BY MOUTH THREE TIMES DAILY AS NEEDED FOR DIZZINESS. 30 tablet 0  . montelukast (SINGULAIR) 10 MG tablet TAKE 1 TABLET BY MOUTH AT BEDTIME. 90 tablet 3  . Multiple Vitamins-Minerals (CENTRUM SILVER 50+MEN) TABS Take 1 tablet by mouth daily.    . NON FORMULARY Diet Type:  Gluten Free    . OXYGEN Inhale 4-6 L/min into the lungs continuous. To maintain sats > 88%    . pantoprazole (PROTONIX) 40 MG tablet TAKE ONE TABLET BY MOUTH DAILY. 30 tablet 0  . polyethylene glycol (MIRALAX / GLYCOLAX) packet Take 17 g by mouth daily as needed for mild constipation. Mix in 8 oz liquid and drink    . predniSONE (DELTASONE) 5 MG tablet TAKE 1 TABLET BY MOUTH DAILY WITH BREAKFAST. 30 tablet 0  . spironolactone (ALDACTONE) 25 MG tablet TAKE (1/2) TABLET BY MOUTH DAILY. 45 tablet 0  . tamsulosin (FLOMAX) 0.4  MG CAPS capsule TAKE ONE CAPSULE BY MOUTH ONCE DAILY. 90 capsule 3   No current facility-administered medications on file prior to visit.   Allergies  Allergen Reactions  . Codeine Other (See Comments)    Thought head was going to blow off/ severe headache  . Gluten Meal Other (See Comments)    Celiac disease  . Adhesive [Tape] Other (See Comments)    Band-aids - tears skin off   Social History   Socioeconomic History  . Marital status: Married    Spouse name: Not on file  . Number of children: 3  . Years of education: Not on file  . Highest education level: Not on file  Occupational History  . 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 Smoking status: Former Smoker    Packs/day: 2.00    Years: 35.00    Pack years: 70.00    Types: Cigarettes    Quit date: 01/21/1989    Years since quitting: 30.8  . Smokeless tobacco: Former Systems developer    Types: [place] date: 08/23/2001  Substance and Sexual Activity  . Alcohol use: No  . Drug use: No  . Sexual activity: Not on file  Other Topics Concern  . Not on file  Social History Narrative  . Not on file   Social Determinants of Health   Financial Resource Strain:   . Difficulty of Paying Living Expenses:   Food Insecurity:   . Worried About Charity fundraiser in the Last Year:   . Arboriculturist in the Last Year:   Transportation Needs:   . Film/video editor (Medical):   Marland Kitchen Lack of Transportation (Non-Medical):   Physical Activity:   . Days of Exercise per Week:   . Minutes of Exercise per Session:   Stress:   . Feeling of Stress :  Social Connections:   . Frequency of Communication with Friends and Family:   . Frequency of Social Gatherings with Friends and Family:   . Attends Religious Services:   . Active Member of Clubs or Organizations:   . Attends Archivist Meetings:   Marland Kitchen Marital Status:   Intimate Partner Violence:   . Fear of Current or  Ex-Partner:   . Emotionally Abused:   Marland Kitchen Physically Abused:   . Sexually Abused:      Review of Systems  All other systems reviewed and are negative.      Objective:   Physical Exam Vitals reviewed.  Constitutional:      General: He is not in acute distress.    Appearance: Normal appearance. He is normal weight. He is not ill-appearing or toxic-appearing.  HENT:     Nose: Congestion and rhinorrhea present. No nasal deformity, septal deviation, signs of injury, laceration, nasal tenderness or mucosal edema.     Right Nostril: Occlusion present. No epistaxis or septal hematoma.     Left Nostril: Occlusion present. No epistaxis or septal hematoma.     Right Turbinates: Not pale.     Right Sinus: Maxillary sinus tenderness present.     Left Sinus: Maxillary sinus tenderness present.  Cardiovascular:     Rate and Rhythm: Normal rate.  Pulmonary:     Effort: Pulmonary effort is normal.     Breath sounds: Normal breath sounds.  Neurological:     Mental Status: He is alert.           Assessment & Plan:  Acute rhinosinusitis  I rechecked the patient's blood pressure and it had improved to 138/52 by the time he left the office.  I believe the majority of the patient's problem is that nasal mucus is drying out due to the high flow oxygen going to his nasal cannula leading to thick mucus obstruction in the nostrils.  I believe this exacerbates the patient's anxiety.  However I now believe that he may also have a sinusitis causing the accumulation of the thick mucus.  Therefore we will treat rhinosinusitis with Augmentin 875 mg p.o. twice daily for 10 days.  Continue to use nasal saline 4-5 times a day to help moisten the mucosa due to the high flow oxygen.  Reassess in 1 week or sooner if worse

## 2019-11-08 ENCOUNTER — Other Ambulatory Visit: Payer: Self-pay

## 2019-11-08 ENCOUNTER — Ambulatory Visit (HOSPITAL_COMMUNITY)
Admission: RE | Admit: 2019-11-08 | Discharge: 2019-11-08 | Disposition: A | Discharge status: Home / Self Care | Payer: Medicare PPO | Source: Ambulatory Visit | Attending: Internal Medicine | Admitting: Internal Medicine

## 2019-11-08 ENCOUNTER — Ambulatory Visit (HOSPITAL_BASED_OUTPATIENT_CLINIC_OR_DEPARTMENT_OTHER)
Admission: RE | Admit: 2019-11-08 | Discharge: 2019-11-08 | Disposition: A | Discharge status: Home / Self Care | Payer: Medicare PPO | Source: Ambulatory Visit | Attending: Internal Medicine | Admitting: Internal Medicine

## 2019-11-08 ENCOUNTER — Encounter (HOSPITAL_COMMUNITY): Payer: Self-pay | Admitting: Internal Medicine

## 2019-11-08 VITALS — BP 116/58 | HR 72 | Wt 124.8 lb

## 2019-11-08 DIAGNOSIS — I4510 Unspecified right bundle-branch block: Secondary | ICD-10-CM | POA: Diagnosis not present

## 2019-11-08 DIAGNOSIS — J9610 Chronic respiratory failure, unspecified whether with hypoxia or hypercapnia: Secondary | ICD-10-CM | POA: Insufficient documentation

## 2019-11-08 DIAGNOSIS — I2510 Atherosclerotic heart disease of native coronary artery without angina pectoris: Secondary | ICD-10-CM | POA: Diagnosis not present

## 2019-11-08 DIAGNOSIS — I341 Nonrheumatic mitral (valve) prolapse: Secondary | ICD-10-CM | POA: Diagnosis not present

## 2019-11-08 DIAGNOSIS — Z87891 Personal history of nicotine dependence: Secondary | ICD-10-CM | POA: Insufficient documentation

## 2019-11-08 DIAGNOSIS — Z8521 Personal history of malignant neoplasm of larynx: Secondary | ICD-10-CM | POA: Insufficient documentation

## 2019-11-08 DIAGNOSIS — Z79899 Other long term (current) drug therapy: Secondary | ICD-10-CM | POA: Diagnosis not present

## 2019-11-08 DIAGNOSIS — J449 Chronic obstructive pulmonary disease, unspecified: Secondary | ICD-10-CM | POA: Diagnosis not present

## 2019-11-08 DIAGNOSIS — Z7982 Long term (current) use of aspirin: Secondary | ICD-10-CM | POA: Insufficient documentation

## 2019-11-08 DIAGNOSIS — I493 Ventricular premature depolarization: Secondary | ICD-10-CM | POA: Diagnosis not present

## 2019-11-08 DIAGNOSIS — I429 Cardiomyopathy, unspecified: Secondary | ICD-10-CM | POA: Diagnosis not present

## 2019-11-08 DIAGNOSIS — I5022 Chronic systolic (congestive) heart failure: Secondary | ICD-10-CM | POA: Diagnosis not present

## 2019-11-08 DIAGNOSIS — N183 Chronic kidney disease, stage 3 unspecified: Secondary | ICD-10-CM | POA: Insufficient documentation

## 2019-11-08 DIAGNOSIS — K219 Gastro-esophageal reflux disease without esophagitis: Secondary | ICD-10-CM | POA: Insufficient documentation

## 2019-11-08 DIAGNOSIS — Z9981 Dependence on supplemental oxygen: Secondary | ICD-10-CM | POA: Insufficient documentation

## 2019-11-08 DIAGNOSIS — I252 Old myocardial infarction: Secondary | ICD-10-CM | POA: Diagnosis not present

## 2019-11-08 DIAGNOSIS — E785 Hyperlipidemia, unspecified: Secondary | ICD-10-CM | POA: Insufficient documentation

## 2019-11-08 DIAGNOSIS — Z7952 Long term (current) use of systemic steroids: Secondary | ICD-10-CM | POA: Diagnosis not present

## 2019-11-08 DIAGNOSIS — F419 Anxiety disorder, unspecified: Secondary | ICD-10-CM | POA: Diagnosis not present

## 2019-11-08 DIAGNOSIS — K900 Celiac disease: Secondary | ICD-10-CM | POA: Insufficient documentation

## 2019-11-08 DIAGNOSIS — I130 Hypertensive heart and chronic kidney disease with heart failure and stage 1 through stage 4 chronic kidney disease, or unspecified chronic kidney disease: Secondary | ICD-10-CM | POA: Diagnosis not present

## 2019-11-08 LAB — CBC
HCT: 47.2 % (ref 39.0–52.0)
Hemoglobin: 14.9 g/dL (ref 13.0–17.0)
MCH: 31.4 pg (ref 26.0–34.0)
MCHC: 31.6 g/dL (ref 30.0–36.0)
MCV: 99.6 fL (ref 80.0–100.0)
Platelets: 233 10*3/uL (ref 150–400)
RBC: 4.74 MIL/uL (ref 4.22–5.81)
RDW: 13.8 % (ref 11.5–15.5)
WBC: 11 10*3/uL — ABNORMAL HIGH (ref 4.0–10.5)
nRBC: 0 % (ref 0.0–0.2)

## 2019-11-08 LAB — COMPREHENSIVE METABOLIC PANEL
ALT: 22 U/L (ref 0–44)
AST: 17 U/L (ref 15–41)
Albumin: 3.4 g/dL — ABNORMAL LOW (ref 3.5–5.0)
Alkaline Phosphatase: 49 U/L (ref 38–126)
Anion gap: 10 (ref 5–15)
BUN: 34 mg/dL — ABNORMAL HIGH (ref 8–23)
CO2: 22 mmol/L (ref 22–32)
Calcium: 9.6 mg/dL (ref 8.9–10.3)
Chloride: 105 mmol/L (ref 98–111)
Creatinine, Ser: 1.32 mg/dL — ABNORMAL HIGH (ref 0.61–1.24)
GFR calc Af Amer: 58 mL/min — ABNORMAL LOW (ref >60)
GFR calc non Af Amer: 50 mL/min — ABNORMAL LOW (ref >60)
Glucose, Bld: 109 mg/dL — ABNORMAL HIGH (ref 70–99)
Potassium: 4.9 mmol/L (ref 3.5–5.1)
Sodium: 137 mmol/L (ref 135–145)
Total Bilirubin: 0.9 mg/dL (ref 0.3–1.2)
Total Protein: 6.7 g/dL (ref 6.5–8.1)

## 2019-11-08 LAB — TSH: TSH: 6.749 u[IU]/mL — ABNORMAL HIGH (ref 0.350–4.500)

## 2019-11-08 LAB — T4, FREE: Free T4: 0.65 ng/dL (ref 0.61–1.12)

## 2019-11-08 LAB — BRAIN NATRIURETIC PEPTIDE: B Natriuretic Peptide: 96 pg/mL (ref 0.0–100.0)

## 2019-11-08 NOTE — Patient Instructions (Signed)
Your provider has recommended that  you wear a Zio Patch for 7 days.  This monitor will record your heart rhythm for our review.  IF you have any symptoms while wearing the monitor please press the button.  If you have any issues with the patch or you notice a red or orange light on it please call the company at [phone].  Once you remove the patch please mail it back to the company as soon as possible so we can get the results.  Labs done today, your results will be available in MyChart, we will contact you for abnormal readings.  Please call our office in September to schedule your follow up appointment  If you have any questions or concerns before your next appointment please send [place] a message through [employer] or call our office at [phone].  At the [facility], you and your health needs are our priority. As part of our continuing mission to provide you with exceptional heart care, we have created designated Provider Care Teams. These Care Teams include your primary Cardiologist (physician) and Advanced Practice Providers (APPs- Physician Assistants and Nurse Practitioners) who all work together to provide you with the care you need, when you need it.   You may see any of the following providers on your designated Care Team at your next follow up: Marland Kitchen Dr Glori Bickers . Dr Loralie Champagne . Darrick Grinder, NP . Lyda Jester, PA . Audry Riles, PharmD   Please be sure to bring in all your medications bottles to every appointment.

## 2019-11-08 NOTE — Progress Notes (Signed)
  Echocardiogram 2D Echocardiogram has been performed.  Zandrea Kenealy A Shereena Berquist 11/08/2019, 10:47 AM

## 2019-11-08 NOTE — Progress Notes (Signed)
Patient ID: Bruce Travis, male   DOB: May 28, 1935, 85 y.o.   MRN: [id number]   ADVANCED HF CLINIC NOTE   Primary Care Physician: Susy Frizzle, MD  HPI:  Bruce Travis is a 85 y.o. male with COPD (O2 dependent). HTN, HL and systolic HF fleto to be related to frequent PVCs.   Admitted in June 2017 with acute HF. Echo with EF 25%. Cath twith essentially normal coronaries. EF 20-25%. Low filling pressures but CO 2.5/1.5. Started on milrinone. 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 Started on amiodarone. PVCs went from ~20/minute to 1-2/minute by time of discharge. Milrinone weaned off.   Echo 04/12/16 LVEF 50-55%, Trivial AI, Mild Bruce, Mild LAE, PA peak pressure 46 mm Hg - much improved from previous.   Admitted 12/18 with PNA.  Admitted 2/20 with COPD flare  Bruce Travis presents today for regular follow up. Remains on amio 100 daily. Says he feels ok. Struggling with sinusitis. Has chronic dyspnea. On home O2. Had some LE edema but improved with reduced salt intake and got better. No orthopnea or PND.   Echo today EF 45-50% with significant dyssynchrony in setting of PVCs RV dilated but normal function. Personally reviewed   Echo 10/18 EF 50-55% Personally reviewed   ECG: NSR 84 with RBBB with PVC couplet Personally reviewed   PMHx:  Past Medical History:  Diagnosis Date  . Allergic rhinitis   . Anxiety   . Bronchiectasis   . CAD (coronary artery disease)   . Celiac disease   . Chronic heart failure ([place])   . Chronic kidney disease (CKD), stage III (moderate)   . COPD (chronic obstructive pulmonary disease) ([place])   . Diverticulosis of colon (without mention of hemorrhage)   . Esophageal reflux   . Family history of adverse reaction to anesthesia    daughter gets PONV  . Family hx of colon cancer   . History of stomach ulcers 1980s  . Hyperlipemia   . Hypertension   . 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 Myocardial infarction [facility])    "previous MI/echo in  12/2015"  . Nephrolithiasis    "got them now; never had OR/scopes" (02/03/2016)  . On home oxygen therapy    "3L-5L; 24/7" (12/29/2017)  . Other diseases of lung, not elsewhere classified   . Personal history of colonic polyps 04/26/2007   hyperplastic   . Pneumonia "several times"    Past Surgical History:  Procedure Laterality Date  . CARDIAC CATHETERIZATION  02/03/2016  . CARDIAC CATHETERIZATION  09/30/2009   non-obstructive CAD w/30% narrowing in prox LAD (Dr. Waunita Schooner)  . CARDIAC CATHETERIZATION  05/04/2001   same at 2011 cath (Dr. Waunita Schooner)  . CARDIAC CATHETERIZATION N/A 02/03/2016   Procedure: Right/Left Heart Cath and Coronary Angiography;  Surgeon: Pixie Casino, MD;  Location: [place] [facility];  Service: Cardiovascular;  Laterality: N/A;  . EPIGLOTOPLASTY W/ MLB     removal due to carcinoma  . EXCISIONAL HEMORRHOIDECTOMY  2000s  . HAND SURGERY Right 1958   d/t crush injury  . TONSILLECTOMY AND ADENOIDECTOMY    . ULTRASOUND GUIDANCE FOR VASCULAR ACCESS  02/03/2016   Procedure: Ultrasound Guidance For Vascular Access;  Surgeon: Pixie Casino, MD;  Location: [place] [facility];  Service: Cardiovascular;;  . VASECTOMY      FAMHx:  Family History  Problem Relation Age of Onset  . Heart disease Father   . Colon cancer Father   .  Prostate cancer Father   . Stroke Mother   . Endometrial cancer Sister   . Stroke Maternal Grandmother   . Cancer Paternal Grandmother     SOCHx:   reports that he quit smoking about 30 years ago. His smoking use included cigarettes. He has a 70.00 pack-year smoking history. He quit smokeless tobacco use about 18 years ago.  His smokeless tobacco use included chew. He reports that he does not drink alcohol or use drugs.  ALLERGIES:  Allergies  Allergen Reactions  . Codeine Other (See Comments)    Thought head was going to blow off/ severe headache  . Gluten Meal Other (See Comments)    Celiac disease  . Adhesive [Tape] Other (See  Comments)    Band-aids - tears skin off    ROS: Pertinent items noted in HPI and remainder of comprehensive ROS otherwise negative.  HOME MEDS: Current Outpatient Medications  Medication Sig Dispense Refill  . acetaminophen (TYLENOL) 325 MG tablet Take 2 tablets (650 mg total) by mouth every 6 (six) hours as needed for fever.    Marland Kitchen albuterol (PROVENTIL) (2.5 MG/3ML) 0.083% nebulizer solution INHALE 1 VIAL VIA NEBULIZER EVERY 4 HOURS AS NEEDED FOR WHEEZING OR SHORTNESS OF BREATH. 525 mL 0  . albuterol (VENTOLIN HFA) 108 (90 Base) MCG/ACT inhaler INHALE 2 PUFFS EVERY 4 HOURS AS NEEDED FOR SHORTNESS OF BREATH. 25.5 g 0  . ALPRAZolam (XANAX) 0.25 MG tablet TAKE ONE TABLET BY MOUTH TWICE A DAY AS NEEDED FOR ANXIETY. 30 tablet 2  . amiodarone (PACERONE) 200 MG tablet TAKE 1/2 TABLET BY MOUTH ONCE DAILY. 45 tablet 0  . amoxicillin-clavulanate (AUGMENTIN) 875-125 MG tablet Take 1 tablet by mouth 2 (two) times daily. 20 tablet 0  . aspirin EC 81 MG tablet Take 81 mg by mouth daily.    Marland Kitchen atorvastatin (LIPITOR) 20 MG tablet TAKE ONE TABLET BY MOUTH ONCE DAILY. 90 tablet 0  . azelastine (ASTELIN) 0.1 % nasal spray USE 2 SPRAYS IN EACH NOSTRILS TWICE DAILY AS DIRECTED. 30 mL 0  . calcitonin, salmon, (MIACALCIN/FORTICAL) 200 UNIT/ACT nasal spray Place 1 spray into alternate nostrils daily. Every other day 3.7 mL 0  . Calcium Carbonate-Vitamin D (CALTRATE 600+D) 600-400 MG-UNIT tablet Take 1 tablet by mouth daily.     Marland Kitchen dextromethorphan-guaiFENesin (MUCINEX DM) 30-600 MG 12hr tablet Take 1 tablet by mouth 2 (two) times daily.    Marland Kitchen ENTRESTO 49-51 MG TAKE (1) TABLET BY MOUTH TWICE DAILY. 180 tablet 0  . fluticasone (FLONASE) 50 MCG/ACT nasal spray Place 1-2 sprays into both nostrils at bedtime. 18.2 mL 5  . Fluticasone-Umeclidin-Vilant (TRELEGY ELLIPTA) 100-62.5-25 MCG/INH AEPB Inhale 1 puff into the lungs daily. 60 each 5  . HYDROcodone-acetaminophen (NORCO/VICODIN) 5-325 MG tablet Take 1 tablet by mouth  every 6 (six) hours as needed for moderate pain. 20 tablet 0  . LUTEIN PO Take 1 capsule by mouth daily.    . meclizine (ANTIVERT) 25 MG tablet TAKE (1) TABLET BY MOUTH THREE TIMES DAILY AS NEEDED FOR DIZZINESS. 30 tablet 0  . montelukast (SINGULAIR) 10 MG tablet TAKE 1 TABLET BY MOUTH AT BEDTIME. 90 tablet 3  . Multiple Vitamins-Minerals (CENTRUM SILVER 50+MEN) TABS Take 1 tablet by mouth daily.    . NON FORMULARY Diet Type:  Gluten Free    . OXYGEN Inhale 4-6 L/min into the lungs continuous. To maintain sats > 88%    . pantoprazole (PROTONIX) 40 MG tablet TAKE ONE TABLET BY MOUTH DAILY. 30 tablet 0  .  polyethylene glycol (MIRALAX / GLYCOLAX) packet Take 17 g by mouth daily as needed for mild constipation. Mix in 8 oz liquid and drink    . predniSONE (DELTASONE) 5 MG tablet TAKE 1 TABLET BY MOUTH DAILY WITH BREAKFAST. 30 tablet 0  . spironolactone (ALDACTONE) 25 MG tablet TAKE (1/2) TABLET BY MOUTH DAILY. 45 tablet 0  . tamsulosin (FLOMAX) 0.4 MG CAPS capsule TAKE ONE CAPSULE BY MOUTH ONCE DAILY. 90 capsule 3   No current facility-administered medications for this encounter.    LABS/IMAGING: No results found for this or any previous visit (from the past 48 hour(s)). No results found.  WEIGHTS: Wt Readings from Last 3 Encounters:  11/08/19 56.6 kg (124 lb 12.8 oz)  11/05/19 57.2 kg (126 lb)  09/25/19 57.6 kg (127 lb)    VITALS: BP (!) 116/58   Pulse 72   Wt 56.6 kg (124 lb 12.8 oz)   SpO2 91% Comment: 4L of oxygen  BMI 19.84 kg/m    EXAM: General:  Elderly thin on O2 No resp difficulty HEENT: normal Neck: supple. no JVD. Carotids 2+ bilat; no bruits. No lymphadenopathy or thryomegaly appreciated. Cor: PMI nondisplaced. Irregular rate & rhythm. No rubs, gallops or murmurs. Lungs: decreased BS throughout. No wheeze  Abdomen: soft, nontender, nondistended. No hepatosplenomegaly. No bruits or masses. Good bowel sounds. Extremities: no cyanosis, clubbing, rash, edema Neuro:  alert & orientedx3, cranial nerves grossly intact. moves all 4 extremities w/o difficulty. Affect pleasant  ASSESSMENT: 1. Chronic systolic HF EF 26%. EF now normal by Echo 04/12/16 LVEF 50-55%, Trivial AI, Mild Bruce, Mild LAE, PA peak pressure 46 mm Hg. Echo 10/18 with EF 50-55%.    - echo today 11/08/19 EF 45-50% seems to have more PVCs. RV dilated but function ok Personally reviewed   - likely due to PVC cardiomyopathy     - EF improved with PVC suppression on amio.But PVC burden seems to increasing again. Will place Zio patch to quantify prior to increasing amio back to 200    - Stable NYHA II-III    - Continue Entresto 49/51 and spiro 12.5 mg daily.  Will not uptitrate with age and borderline hyperkalemia.     - No b-blocker with severe COPD   - RTC in 6 months with repeat echo   2. PVC related cardiomyopathy   - PVCs suppressed with amio with normalization of LV function. amio now 155m daily.   - As above, PVC burden seems to increasing again. Will place Zio patch to quantify prior to increasing amio back to 200    - Agree with concern risk of amio toxicity given the severity of his COPD. That said risk of lung toxicity on amio 100 daily is very low and he was very ill with severe PVC cardiomyopathy. Not good candidate for PVC ablation. I previously discussed with Dr. ARayann Hemanin EP and we both agree that risk of stopping amio outweighs risk of continuing low-dose amio.  -check labs including TSH, LFTs. Eye exam 1/19 was ok.  - repeat blood work today   3. Chronic respiratory failure due to COPD on home O2 - stable on 3-4L O2 - followed by Dr. WMelvyn Novas 4. HTN  - Blood pressure well controlled. Continue current regimen.    DGlori BickersMD 11/08/2019, 11:56 AM

## 2019-11-08 NOTE — Addendum Note (Signed)
Encounter addended by: Scarlette Calico, RN on: 11/08/2019 12:12 PM  Actions taken: Order list changed, Diagnosis association updated, Clinical Note Signed, Charge Capture section accepted

## 2019-11-09 LAB — T3, FREE: T3, Free: 3.2 pg/mL (ref 2.0–4.4)

## 2019-11-13 DIAGNOSIS — J449 Chronic obstructive pulmonary disease, unspecified: Secondary | ICD-10-CM | POA: Diagnosis not present

## 2019-11-24 DIAGNOSIS — I493 Ventricular premature depolarization: Secondary | ICD-10-CM | POA: Diagnosis not present

## 2019-11-26 NOTE — Addendum Note (Signed)
Encounter addended by: Micki Riley, RN on: 11/26/2019 4:13 PM  Actions taken: Imaging Exam ended

## 2019-11-27 ENCOUNTER — Other Ambulatory Visit: Payer: Self-pay | Admitting: Family Medicine

## 2019-11-27 ENCOUNTER — Other Ambulatory Visit: Payer: Self-pay

## 2019-11-27 ENCOUNTER — Ambulatory Visit (INDEPENDENT_AMBULATORY_CARE_PROVIDER_SITE_OTHER): Payer: Medicare PPO | Admitting: Family Medicine

## 2019-11-27 ENCOUNTER — Encounter: Payer: Self-pay | Admitting: Family Medicine

## 2019-11-27 VITALS — BP 130/64 | HR 86 | Temp 97.1°F | Resp 16 | Ht 65.6 in | Wt 127.0 lb

## 2019-11-27 DIAGNOSIS — K921 Melena: Secondary | ICD-10-CM

## 2019-11-27 LAB — HEMOGLOBIN, FINGERSTICK: POC HEMOGLOBIN: 14.7 g/dL (ref 13.0–17.0)

## 2019-11-27 NOTE — Progress Notes (Signed)
Subjective:    Patient ID: Bruce Travis, male    DOB: May 24, 1935, 85 y.o.   MRN: [id number]  HPI  Patient was recently battling constipation.  Therefore he took some prunes.  This caused diarrhea.  Therefore last Tuesday he took several doses of Pepto-Bismol.  This stopped his diarrhea and in fact he was constipated for the remainder of the week.  This week he had he had 3 dark black bowel movements.  This obviously concerned him.  Therefore he presents today for evaluation.  He states that his bowel movements are still dark in color though not as bad.  Patient's hemoglobin was 14.9 on lab work earlier in March.  Her fingerstick hemoglobin today is 14.7 and essentially unchanged suggesting no hemorrhage.  Patient denies any abdominal pain.  He denies any indigestion.  He denies any nausea or vomiting.  Rectal exam was performed today.  There are no masses on rectal exam.  Stool sample was obtained.  Patient is guaiac negative on rectal exam today in the office. Past Medical History:  Diagnosis Date  . Allergic rhinitis   . Anxiety   . Bronchiectasis   . CAD (coronary artery disease)   . Celiac disease   . Chronic heart failure ([place])   . Chronic kidney disease (CKD), stage III (moderate)   . COPD (chronic obstructive pulmonary disease) ([place])   . Diverticulosis of colon (without mention of hemorrhage)   . Esophageal reflux   . Family history of adverse reaction to anesthesia    daughter gets PONV  . Family hx of colon cancer   . History of stomach ulcers 1980s  . Hyperlipemia   . Hypertension   . 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 Myocardial infarction [facility])    "previous MI/echo in 12/2015"  . Nephrolithiasis    "got them now; never had OR/scopes" (02/03/2016)  . On home oxygen therapy    "3L-5L; 24/7" (12/29/2017)  . Other diseases of lung, not elsewhere classified   . Personal history of colonic polyps 04/26/2007   hyperplastic   . Pneumonia "several times"     Past Surgical History:  Procedure Laterality Date  . CARDIAC CATHETERIZATION  02/03/2016  . CARDIAC CATHETERIZATION  09/30/2009   non-obstructive CAD w/30% narrowing in prox LAD (Dr. Waunita Schooner)  . CARDIAC CATHETERIZATION  05/04/2001   same at 2011 cath (Dr. Waunita Schooner)  . CARDIAC CATHETERIZATION N/A 02/03/2016   Procedure: Right/Left Heart Cath and Coronary Angiography;  Surgeon: Pixie Casino, MD;  Location: [place] [facility];  Service: Cardiovascular;  Laterality: N/A;  . EPIGLOTOPLASTY W/ MLB     removal due to carcinoma  . EXCISIONAL HEMORRHOIDECTOMY  2000s  . HAND SURGERY Right 1958   d/t crush injury  . TONSILLECTOMY AND ADENOIDECTOMY    . ULTRASOUND GUIDANCE FOR VASCULAR ACCESS  02/03/2016   Procedure: Ultrasound Guidance For Vascular Access;  Surgeon: Pixie Casino, MD;  Location: [place] [facility];  Service: Cardiovascular;;  . VASECTOMY     Current Outpatient Medications on File Prior to Visit  Medication Sig Dispense Refill  . acetaminophen (TYLENOL) 325 MG tablet Take 2 tablets (650 mg total) by mouth every 6 (six) hours as needed for fever.    Marland Kitchen albuterol (PROVENTIL) (2.5 MG/3ML) 0.083% nebulizer solution INHALE 1 VIAL VIA NEBULIZER EVERY 4 HOURS AS NEEDED FOR WHEEZING OR SHORTNESS OF BREATH. 525 mL 0  . albuterol (VENTOLIN HFA) 108 (90 Base)  MCG/ACT inhaler INHALE 2 PUFFS EVERY 4 HOURS AS NEEDED FOR SHORTNESS OF BREATH. 25.5 g 0  . ALPRAZolam (XANAX) 0.25 MG tablet TAKE ONE TABLET BY MOUTH TWICE A DAY AS NEEDED FOR ANXIETY. 30 tablet 2  . amiodarone (PACERONE) 200 MG tablet TAKE 1/2 TABLET BY MOUTH ONCE DAILY. 45 tablet 0  . amoxicillin-clavulanate (AUGMENTIN) 875-125 MG tablet Take 1 tablet by mouth 2 (two) times daily. 20 tablet 0  . aspirin EC 81 MG tablet Take 81 mg by mouth daily.    Marland Kitchen atorvastatin (LIPITOR) 20 MG tablet TAKE ONE TABLET BY MOUTH ONCE DAILY. 90 tablet 0  . azelastine (ASTELIN) 0.1 % nasal spray USE 2 SPRAYS IN EACH NOSTRILS TWICE DAILY AS  DIRECTED. 30 mL 0  . calcitonin, salmon, (MIACALCIN/FORTICAL) 200 UNIT/ACT nasal spray Place 1 spray into alternate nostrils daily. Every other day 3.7 mL 0  . Calcium Carbonate-Vitamin D (CALTRATE 600+D) 600-400 MG-UNIT tablet Take 1 tablet by mouth daily.     Marland Kitchen dextromethorphan-guaiFENesin (MUCINEX DM) 30-600 MG 12hr tablet Take 1 tablet by mouth 2 (two) times daily.    Marland Kitchen ENTRESTO 49-51 MG TAKE (1) TABLET BY MOUTH TWICE DAILY. 180 tablet 0  . fluticasone (FLONASE) 50 MCG/ACT nasal spray Place 1-2 sprays into both nostrils at bedtime. 18.2 mL 5  . Fluticasone-Umeclidin-Vilant (TRELEGY ELLIPTA) 100-62.5-25 MCG/INH AEPB Inhale 1 puff into the lungs daily. 60 each 5  . HYDROcodone-acetaminophen (NORCO/VICODIN) 5-325 MG tablet Take 1 tablet by mouth every 6 (six) hours as needed for moderate pain. 20 tablet 0  . LUTEIN PO Take 1 capsule by mouth daily.    . meclizine (ANTIVERT) 25 MG tablet TAKE (1) TABLET BY MOUTH THREE TIMES DAILY AS NEEDED FOR DIZZINESS. 30 tablet 0  . montelukast (SINGULAIR) 10 MG tablet TAKE 1 TABLET BY MOUTH AT BEDTIME. 90 tablet 3  . Multiple Vitamins-Minerals (CENTRUM SILVER 50+MEN) TABS Take 1 tablet by mouth daily.    . NON FORMULARY Diet Type:  Gluten Free    . OXYGEN Inhale 4-6 L/min into the lungs continuous. To maintain sats > 88%    . pantoprazole (PROTONIX) 40 MG tablet TAKE ONE TABLET BY MOUTH DAILY. 30 tablet 0  . polyethylene glycol (MIRALAX / GLYCOLAX) packet Take 17 g by mouth daily as needed for mild constipation. Mix in 8 oz liquid and drink    . predniSONE (DELTASONE) 5 MG tablet TAKE 1 TABLET BY MOUTH DAILY WITH BREAKFAST. 90 tablet 0  . spironolactone (ALDACTONE) 25 MG tablet TAKE (1/2) TABLET BY MOUTH DAILY. 45 tablet 0  . tamsulosin (FLOMAX) 0.4 MG CAPS capsule TAKE ONE CAPSULE BY MOUTH ONCE DAILY. 90 capsule 3   No current facility-administered medications on file prior to visit.   Allergies  Allergen Reactions  . Codeine Other (See Comments)     Thought head was going to blow off/ severe headache  . Gluten Meal Other (See Comments)    Celiac disease  . Adhesive [Tape] Other (See Comments)    Band-aids - tears skin off   Social History   Socioeconomic History  . Marital status: Married    Spouse name: Not on file  . Number of children: 3  . Years of education: Not on file  . Highest education level: Not on file  Occupational History  . 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 Smoking status: Former Smoker    Packs/day: 2.00  Years: 35.00    Pack years: 70.00    Types: Cigarettes    Quit date: 01/21/1989    Years since quitting: 30.8  . Smokeless tobacco: Former Systems developer    Types: [place] date: 08/23/2001  Substance and Sexual Activity  . Alcohol use: No  . Drug use: No  . Sexual activity: Not on file  Other Topics Concern  . Not on file  Social History Narrative  . Not on file   Social Determinants of Health   Financial Resource Strain:   . Difficulty of Paying Living Expenses:   Food Insecurity:   . Worried About Charity fundraiser in the Last Year:   . Arboriculturist in the Last Year:   Transportation Needs:   . Film/video editor (Medical):   Marland Kitchen Lack of Transportation (Non-Medical):   Physical Activity:   . Days of Exercise per Week:   . Minutes of Exercise per Session:   Stress:   . Feeling of Stress :   Social Connections:   . Frequency of Communication with Friends and Family:   . Frequency of Social Gatherings with Friends and Family:   . Attends Religious Services:   . Active Member of Clubs or Organizations:   . Attends Archivist Meetings:   Marland Kitchen Marital Status:   Intimate Partner Violence:   . Fear of Current or Ex-Partner:   . Emotionally Abused:   Marland Kitchen Physically Abused:   . Sexually Abused:       Review of Systems  All other systems reviewed and are negative.      Objective:   Physical Exam  HENT:  Right Ear:  Tympanic membrane and ear canal normal.  Left Ear: Tympanic membrane and ear canal normal.  Nose: No mucosal edema or rhinorrhea.  Cardiovascular: Normal rate, regular rhythm and normal heart sounds.  No murmur heard. Pulmonary/Chest: Effort normal. No accessory muscle usage. No respiratory distress. He has decreased breath sounds. He has no wheezes. He exhibits no tenderness.  Abdominal: Soft. Bowel sounds are normal.  Genitourinary: Rectum:     Guaiac result negative.     No rectal mass, anal fissure, tenderness, external hemorrhoid or abnormal anal tone.   Musculoskeletal:        General: No edema.  Vitals reviewed.        Assessment & Plan:   Black stools - Plan: Hemoglobin, fingerstick  I believe the dark stools are likely due to the Pepto-Bismol.  His hemoglobin is stable suggesting against any significant upper GI bleed.  His blood pressure stable and his vital signs are normal suggesting no evidence of acute hemorrhage.  He is guaiac negative on exam.  Therefore I believe that once the Pepto-Bismol has cleared his system the stool will regain its normal color.  Recommended that he take senna or Colace over-the-counter for constipation.  Recheck if melena continues

## 2019-11-29 ENCOUNTER — Telehealth (HOSPITAL_COMMUNITY): Payer: Self-pay | Admitting: *Deleted

## 2019-11-29 DIAGNOSIS — J449 Chronic obstructive pulmonary disease, unspecified: Secondary | ICD-10-CM | POA: Diagnosis not present

## 2019-11-29 NOTE — Telephone Encounter (Signed)
Pt called requesting zio results. I spoke with Kevan Rosebush, RN she said Dr.Bensimhon has not posted results yet. Pt aware and informed we will call him once we have results.

## 2019-11-30 ENCOUNTER — Other Ambulatory Visit: Payer: Self-pay | Admitting: Family Medicine

## 2019-11-30 ENCOUNTER — Other Ambulatory Visit (HOSPITAL_COMMUNITY): Payer: Self-pay | Admitting: Internal Medicine

## 2019-11-30 NOTE — Telephone Encounter (Signed)
Ok to refill??  Last office visit 11/27/2019.  Last refill 09/18/2019, #2 refills.

## 2019-12-02 ENCOUNTER — Other Ambulatory Visit: Payer: Self-pay | Admitting: Family Medicine

## 2019-12-14 DIAGNOSIS — J449 Chronic obstructive pulmonary disease, unspecified: Secondary | ICD-10-CM | POA: Diagnosis not present

## 2019-12-19 ENCOUNTER — Other Ambulatory Visit (HOSPITAL_COMMUNITY): Payer: Self-pay | Admitting: Internal Medicine

## 2019-12-19 ENCOUNTER — Other Ambulatory Visit: Payer: Self-pay | Admitting: Family Medicine

## 2019-12-19 NOTE — Telephone Encounter (Signed)
Pt is requesting refill on Xanax   LOV: 11/27/2019  LRF:   11/30/2019

## 2019-12-20 ENCOUNTER — Other Ambulatory Visit: Payer: Self-pay | Admitting: Family Medicine

## 2019-12-20 ENCOUNTER — Encounter: Payer: Self-pay | Admitting: Family Medicine

## 2019-12-20 ENCOUNTER — Ambulatory Visit: Payer: Medicare PPO | Admitting: Family Medicine

## 2019-12-20 ENCOUNTER — Telehealth: Payer: Self-pay | Admitting: Family Medicine

## 2019-12-20 ENCOUNTER — Other Ambulatory Visit: Payer: Self-pay

## 2019-12-20 VITALS — BP 128/74 | HR 78 | Temp 98.0°F | Resp 22 | Ht 65.6 in | Wt 126.0 lb

## 2019-12-20 DIAGNOSIS — R0602 Shortness of breath: Secondary | ICD-10-CM

## 2019-12-20 NOTE — Progress Notes (Signed)
Subjective:    Patient ID: Bruce Travis, male    DOB: 1934/10/15, 85 y.o.   MRN: [id number]  HPI  Patient presents today with shortness of breath.  He states that for the last several months he is struggling to catch his breath.  He has severe oxygen dependent COPD.  However today on examination, he has good air exchange in all 4 lung fields.  There is no appreciable wheezing.  There are no crackles or rails or rhonchorous breath sounds.  Patient is far above his baseline today.  He states that he feels like he cannot pull enough air through his nose.  He states that he feels like his nose is obstructed.  However he is on Astelin as well as Flonase as well as Singulair.  On visual inspection today there is no obvious obstruction in either nostril.  He denies any sinus pain.  He denies any head congestion.  He occasionally has some postnasal drip.  He does have a history of congestive heart failure however his lungs show no evidence of pulmonary edema today and he has no peripheral edema on exam.  Differential diagnosis includes upper airway obstruction due to mucous, COPD, anxiety related dyspnea, or possibly congestive heart failure related dyspnea.  Based on his exam today, I favor more likely that this is anxiety related given its chronic nature.  I believe the patient has chronic air hunger and his anxiety is making this worse.  At present he is using Xanax 0.25 mg twice daily and he does see temporary relief from his dyspnea when he uses this. Past Medical History:  Diagnosis Date  . Allergic rhinitis   . Anxiety   . Bronchiectasis   . CAD (coronary artery disease)   . Celiac disease   . Chronic heart failure ([place])   . Chronic kidney disease (CKD), stage III (moderate)   . COPD (chronic obstructive pulmonary disease) ([place])   . Diverticulosis of colon (without mention of hemorrhage)   . Esophageal reflux   . Family history of adverse reaction to anesthesia    daughter gets PONV  . Family hx of  colon cancer   . History of stomach ulcers 1980s  . Hyperlipemia   . Hypertension   . 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 Myocardial infarction [facility])    "previous MI/echo in 12/2015"  . Nephrolithiasis    "got them now; never had OR/scopes" (02/03/2016)  . On home oxygen therapy    "3L-5L; 24/7" (12/29/2017)  . Other diseases of lung, not elsewhere classified   . Personal history of colonic polyps 04/26/2007   hyperplastic   . Pneumonia "several times"   Past Surgical History:  Procedure Laterality Date  . CARDIAC CATHETERIZATION  02/03/2016  . CARDIAC CATHETERIZATION  09/30/2009   non-obstructive CAD w/30% narrowing in prox LAD (Dr. Waunita Schooner)  . CARDIAC CATHETERIZATION  05/04/2001   same at 2011 cath (Dr. Waunita Schooner)  . CARDIAC CATHETERIZATION N/A 02/03/2016   Procedure: Right/Left Heart Cath and Coronary Angiography;  Surgeon: Pixie Casino, MD;  Location: [place] [facility];  Service: Cardiovascular;  Laterality: N/A;  . EPIGLOTOPLASTY W/ MLB     removal due to carcinoma  . EXCISIONAL HEMORRHOIDECTOMY  2000s  . HAND SURGERY Right 1958   d/t crush injury  . TONSILLECTOMY AND ADENOIDECTOMY    . ULTRASOUND GUIDANCE FOR VASCULAR ACCESS  02/03/2016   Procedure: Ultrasound Guidance For Vascular Access;  Surgeon:  Pixie Casino, MD;  Location: [place] [facility];  Service: Cardiovascular;;  . VASECTOMY     Current Outpatient Medications on File Prior to Visit  Medication Sig Dispense Refill  . acetaminophen (TYLENOL) 325 MG tablet Take 2 tablets (650 mg total) by mouth every 6 (six) hours as needed for fever.    Marland Kitchen albuterol (PROVENTIL) (2.5 MG/3ML) 0.083% nebulizer solution INHALE 1 VIAL VIA NEBULIZER EVERY 4 HOURS AS NEEDED FOR WHEEZING OR SHORTNESS OF BREATH. 525 mL 0  . albuterol (VENTOLIN HFA) 108 (90 Base) MCG/ACT inhaler INHALE 2 PUFFS EVERY 4 HOURS AS NEEDED FOR SHORTNESS OF BREATH. 25.5 g 0  . ALPRAZolam (XANAX) 0.25 MG tablet TAKE ONE TABLET BY  MOUTH TWICE A DAY AS NEEDED FOR ANXIETY. 30 tablet 0  . amiodarone (PACERONE) 200 MG tablet TAKE 1/2 TABLET BY MOUTH ONCE DAILY. 45 tablet 3  . amoxicillin-clavulanate (AUGMENTIN) 875-125 MG tablet Take 1 tablet by mouth 2 (two) times daily. 20 tablet 0  . aspirin EC 81 MG tablet Take 81 mg by mouth daily.    Marland Kitchen atorvastatin (LIPITOR) 20 MG tablet TAKE ONE TABLET BY MOUTH ONCE DAILY. 90 tablet 3  . azelastine (ASTELIN) 0.1 % nasal spray USE 2 SPRAYS IN EACH NOSTRILS TWICE DAILY AS DIRECTED. 30 mL 2  . calcitonin, salmon, (MIACALCIN/FORTICAL) 200 UNIT/ACT nasal spray Place 1 spray into alternate nostrils daily. Every other day 3.7 mL 0  . Calcium Carbonate-Vitamin D (CALTRATE 600+D) 600-400 MG-UNIT tablet Take 1 tablet by mouth daily.     Marland Kitchen dextromethorphan-guaiFENesin (MUCINEX DM) 30-600 MG 12hr tablet Take 1 tablet by mouth 2 (two) times daily.    Marland Kitchen ENTRESTO 49-51 MG TAKE (1) TABLET BY MOUTH TWICE DAILY. 180 tablet 3  . fluticasone (FLONASE) 50 MCG/ACT nasal spray Place 1-2 sprays into both nostrils at bedtime. 18.2 mL 5  . Fluticasone-Umeclidin-Vilant (TRELEGY ELLIPTA) 100-62.5-25 MCG/INH AEPB Inhale 1 puff into the lungs daily. 60 each 5  . HYDROcodone-acetaminophen (NORCO/VICODIN) 5-325 MG tablet Take 1 tablet by mouth every 6 (six) hours as needed for moderate pain. 20 tablet 0  . LUTEIN PO Take 1 capsule by mouth daily.    . meclizine (ANTIVERT) 25 MG tablet TAKE (1) TABLET BY MOUTH THREE TIMES DAILY AS NEEDED FOR DIZZINESS. 30 tablet 0  . montelukast (SINGULAIR) 10 MG tablet TAKE 1 TABLET BY MOUTH AT BEDTIME. 90 tablet 3  . Multiple Vitamins-Minerals (CENTRUM SILVER 50+MEN) TABS Take 1 tablet by mouth daily.    . NON FORMULARY Diet Type:  Gluten Free    . OXYGEN Inhale 4-6 L/min into the lungs continuous. To maintain sats > 88%    . polyethylene glycol (MIRALAX / GLYCOLAX) packet Take 17 g by mouth daily as needed for mild constipation. Mix in 8 oz liquid and drink    . predniSONE  (DELTASONE) 5 MG tablet TAKE 1 TABLET BY MOUTH DAILY WITH BREAKFAST. 90 tablet 0  . spironolactone (ALDACTONE) 25 MG tablet TAKE (1/2) TABLET BY MOUTH DAILY. 45 tablet 0  . tamsulosin (FLOMAX) 0.4 MG CAPS capsule TAKE ONE CAPSULE BY MOUTH ONCE DAILY. 90 capsule 3   No current facility-administered medications on file prior to visit.   Allergies  Allergen Reactions  . Codeine Other (See Comments)    Thought head was going to blow off/ severe headache  . Gluten Meal Other (See Comments)    Celiac disease  . Adhesive [Tape] Other (See Comments)    Band-aids - tears skin off  Social History   Socioeconomic History  . Marital status: Married    Spouse name: Not on file  . Number of children: 3  . Years of education: Not on file  . Highest education level: Not on file  Occupational History  . 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 Smoking status: Former Smoker    Packs/day: 2.00    Years: 35.00    Pack years: 70.00    Types: Cigarettes    Quit date: 01/21/1989    Years since quitting: 30.9  . Smokeless tobacco: Former Systems developer    Types: [place] date: 08/23/2001  Substance and Sexual Activity  . Alcohol use: No  . Drug use: No  . Sexual activity: Not on file  Other Topics Concern  . Not on file  Social History Narrative  . Not on file   Social Determinants of Health   Financial Resource Strain:   . Difficulty of Paying Living Expenses:   Food Insecurity:   . Worried About Charity fundraiser in the Last Year:   . Arboriculturist in the Last Year:   Transportation Needs:   . Film/video editor (Medical):   Marland Kitchen Lack of Transportation (Non-Medical):   Physical Activity:   . Days of Exercise per Week:   . Minutes of Exercise per Session:   Stress:   . Feeling of Stress :   Social Connections:   . Frequency of Communication with Friends and Family:   . Frequency of Social Gatherings with Friends and Family:   .  Attends Religious Services:   . Active Member of Clubs or Organizations:   . Attends Archivist Meetings:   Marland Kitchen Marital Status:   Intimate Partner Violence:   . Fear of Current or Ex-Partner:   . Emotionally Abused:   Marland Kitchen Physically Abused:   . Sexually Abused:       Review of Systems  All other systems reviewed and are negative.      Objective:   Physical Exam  HENT:  Right Ear: Tympanic membrane and ear canal normal.  Left Ear: Tympanic membrane and ear canal normal.  Nose: No mucosal edema, rhinorrhea, nose lacerations, sinus tenderness or nasal deformity.  No foreign bodies.  Cardiovascular: Normal rate, regular rhythm and normal heart sounds.  No murmur heard. Pulmonary/Chest: Effort normal. No accessory muscle usage. No respiratory distress. He has decreased breath sounds. He has no wheezes. He exhibits no tenderness.  Abdominal: Soft. Bowel sounds are normal.  Genitourinary: Rectum:     Guaiac result negative.     No rectal mass, anal fissure, tenderness, external hemorrhoid or abnormal anal tone.   Musculoskeletal:        General: No edema.  Vitals reviewed.        Assessment & Plan:  Shortness of breath  Patient has multifactorial dyspnea.  Differential diagnosis includes COPD, nasal obstruction due to mucous from allergies, anxiety related dyspnea, or possibly congestive heart failure.  However my clinical instinct favors anxiety is most likely cause.  The patient seems anxious.  I believe that the adrenergic excess from his inhalers is likely exacerbating this.  Therefore I would like to try increasing his Xanax and strength and frequency over the weekend to see if this helps his subjective dyspnea.  Objectively on his exam he appears to be at his baseline.  Therefore we will increase his  Xanax to 0.5 mg 3 times daily and he will call me Monday with an update.  If his dyspnea improved by increasing the Xanax, we can try to find a more long-term solution for  him.

## 2019-12-20 NOTE — Telephone Encounter (Signed)
#  [phone] Wife would like to know if Dr. Dennard Schaumann recommend using Aff 10 vest (COPD) for Mr Reifsteck

## 2019-12-21 ENCOUNTER — Other Ambulatory Visit (HOSPITAL_COMMUNITY): Payer: Self-pay | Admitting: Internal Medicine

## 2019-12-21 NOTE — Telephone Encounter (Signed)
I have only seen that used in cystic fibrosis. The theory is the percussion can help break up mucus in the lungs so he can cough it up easier.  I certainly don't think it would hurt anything but when I saw him yesterday, he indicated more trouble breathing through his nostrils and seemed more anxious.  I don't think it would help that.  I would try the xanax over the weekend because I think a lot of this may be anxiety just to see if it helps. However, the afflo vest is reasonable and I'd be glad to fill out paperwork for it if they need it.

## 2019-12-24 NOTE — Telephone Encounter (Signed)
Pt aware and states that he is taking the Xanax tid and it is working well.

## 2019-12-27 ENCOUNTER — Other Ambulatory Visit: Payer: Self-pay | Admitting: Family Medicine

## 2019-12-27 MED ORDER — ALPRAZOLAM 0.25 MG PO TABS: ORAL_TABLET | ORAL | 0 refills | Status: AC

## 2019-12-27 NOTE — Telephone Encounter (Signed)
Rx set up for TID dosing

## 2019-12-27 NOTE — Telephone Encounter (Signed)
Dr pickard increased patients qty of xanax taken per day, is going to need refill before his original fill date if possible  [employer] and he said he needed it delivered

## 2019-12-28 ENCOUNTER — Other Ambulatory Visit: Payer: Self-pay

## 2019-12-28 ENCOUNTER — Encounter: Payer: Self-pay | Admitting: Family Medicine

## 2019-12-28 ENCOUNTER — Ambulatory Visit (INDEPENDENT_AMBULATORY_CARE_PROVIDER_SITE_OTHER): Payer: Medicare PPO | Admitting: Family Medicine

## 2019-12-28 VITALS — BP 122/74 | HR 91 | Temp 96.4°F | Resp 20

## 2019-12-28 DIAGNOSIS — R040 Epistaxis: Secondary | ICD-10-CM | POA: Diagnosis not present

## 2019-12-28 NOTE — Progress Notes (Signed)
Subjective:    Patient ID: Bruce Travis, male    DOB: 08-17-1935, 85 y.o.   MRN: [id number]  HPI  Patient presents today with a nosebleed.  It began this morning.  It was coming from his left nostril.  He was unable to get it to stop bleeding.  Therefore he came to our office and was worked in urgently.  However on examination, the bleeding has completely stopped.  On the anterior portion of the left nasal septum there is a friable area that spreads onto the floor of the nostril.  This area is very small probably 1 to 2 mm.  There is dried blood adherent in this area and no active bleeding.  There is no visible blood coming from the posterior oropharynx.  The bleeding spontaneously stopped. Past Medical History:  Diagnosis Date  . Allergic rhinitis   . Anxiety   . Bronchiectasis   . CAD (coronary artery disease)   . Celiac disease   . Chronic heart failure ([place])   . Chronic kidney disease (CKD), stage III (moderate)   . COPD (chronic obstructive pulmonary disease) ([place])   . Diverticulosis of colon (without mention of hemorrhage)   . Esophageal reflux   . Family history of adverse reaction to anesthesia    daughter gets PONV  . Family hx of colon cancer   . History of stomach ulcers 1980s  . Hyperlipemia   . Hypertension   . 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 Myocardial infarction [facility])    "previous MI/echo in 12/2015"  . Nephrolithiasis    "got them now; never had OR/scopes" (02/03/2016)  . On home oxygen therapy    "3L-5L; 24/7" (12/29/2017)  . Other diseases of lung, not elsewhere classified   . Personal history of colonic polyps 04/26/2007   hyperplastic   . Pneumonia "several times"   Past Surgical History:  Procedure Laterality Date  . CARDIAC CATHETERIZATION  02/03/2016  . CARDIAC CATHETERIZATION  09/30/2009   non-obstructive CAD w/30% narrowing in prox LAD (Dr. Waunita Schooner)  . CARDIAC CATHETERIZATION  05/04/2001   same at 2011 cath (Dr. Waunita Schooner)  . CARDIAC CATHETERIZATION N/A 02/03/2016   Procedure: Right/Left Heart Cath and Coronary Angiography;  Surgeon: Pixie Casino, MD;  Location: [place] [facility];  Service: Cardiovascular;  Laterality: N/A;  . EPIGLOTOPLASTY W/ MLB     removal due to carcinoma  . EXCISIONAL HEMORRHOIDECTOMY  2000s  . HAND SURGERY Right 1958   d/t crush injury  . TONSILLECTOMY AND ADENOIDECTOMY    . ULTRASOUND GUIDANCE FOR VASCULAR ACCESS  02/03/2016   Procedure: Ultrasound Guidance For Vascular Access;  Surgeon: Pixie Casino, MD;  Location: [place] [facility];  Service: Cardiovascular;;  . VASECTOMY     Current Outpatient Medications on File Prior to Visit  Medication Sig Dispense Refill  . acetaminophen (TYLENOL) 325 MG tablet Take 2 tablets (650 mg total) by mouth every 6 (six) hours as needed for fever.    Marland Kitchen albuterol (PROVENTIL) (2.5 MG/3ML) 0.083% nebulizer solution INHALE 1 VIAL VIA NEBULIZER EVERY 4 HOURS AS NEEDED FOR WHEEZING OR SHORTNESS OF BREATH. 525 mL 0  . albuterol (VENTOLIN HFA) 108 (90 Base) MCG/ACT inhaler INHALE 2 PUFFS EVERY 4 HOURS AS NEEDED FOR SHORTNESS OF BREATH. 25.5 g 0  . ALPRAZolam (XANAX) 0.25 MG tablet TAKE ONE TABLET BY MOUTH TID AS NEEDED FOR ANXIETY. 90 tablet 0  . amiodarone (PACERONE) 200 MG  tablet TAKE 1/2 TABLET BY MOUTH ONCE DAILY. 45 tablet 3  . aspirin EC 81 MG tablet Take 81 mg by mouth daily.    Marland Kitchen atorvastatin (LIPITOR) 20 MG tablet TAKE ONE TABLET BY MOUTH ONCE DAILY. 90 tablet 3  . azelastine (ASTELIN) 0.1 % nasal spray USE 2 SPRAYS IN EACH NOSTRILS TWICE DAILY AS DIRECTED. 30 mL 2  . calcitonin, salmon, (MIACALCIN/FORTICAL) 200 UNIT/ACT nasal spray Place 1 spray into alternate nostrils daily. Every other day 3.7 mL 0  . Calcium Carbonate-Vitamin D (CALTRATE 600+D) 600-400 MG-UNIT tablet Take 1 tablet by mouth daily.     Marland Kitchen dextromethorphan-guaiFENesin (MUCINEX DM) 30-600 MG 12hr tablet Take 1 tablet by mouth 2 (two) times daily.    Marland Kitchen ENTRESTO 49-51 MG  TAKE (1) TABLET BY MOUTH TWICE DAILY. 180 tablet 3  . fluticasone (FLONASE) 50 MCG/ACT nasal spray Place 1-2 sprays into both nostrils at bedtime. 18.2 mL 5  . Fluticasone-Umeclidin-Vilant (TRELEGY ELLIPTA) 100-62.5-25 MCG/INH AEPB Inhale 1 puff into the lungs daily. 60 each 5  . HYDROcodone-acetaminophen (NORCO/VICODIN) 5-325 MG tablet Take 1 tablet by mouth every 6 (six) hours as needed for moderate pain. 20 tablet 0  . LUTEIN PO Take 1 capsule by mouth daily.    . meclizine (ANTIVERT) 25 MG tablet TAKE (1) TABLET BY MOUTH THREE TIMES DAILY AS NEEDED FOR DIZZINESS. 30 tablet 0  . montelukast (SINGULAIR) 10 MG tablet TAKE 1 TABLET BY MOUTH AT BEDTIME. 90 tablet 3  . Multiple Vitamins-Minerals (CENTRUM SILVER 50+MEN) TABS Take 1 tablet by mouth daily.    . NON FORMULARY Diet Type:  Gluten Free    . OXYGEN Inhale 4-6 L/min into the lungs continuous. To maintain sats > 88%    . pantoprazole (PROTONIX) 40 MG tablet TAKE ONE TABLET BY MOUTH DAILY. 90 tablet 0  . polyethylene glycol (MIRALAX / GLYCOLAX) packet Take 17 g by mouth daily as needed for mild constipation. Mix in 8 oz liquid and drink    . predniSONE (DELTASONE) 5 MG tablet TAKE 1 TABLET BY MOUTH DAILY WITH BREAKFAST. 90 tablet 0  . spironolactone (ALDACTONE) 25 MG tablet TAKE (1/2) TABLET BY MOUTH DAILY. 45 tablet 3  . tamsulosin (FLOMAX) 0.4 MG CAPS capsule TAKE ONE CAPSULE BY MOUTH ONCE DAILY. 90 capsule 3   No current facility-administered medications on file prior to visit.   Allergies  Allergen Reactions  . Codeine Other (See Comments)    Thought head was going to blow off/ severe headache  . Gluten Meal Other (See Comments)    Celiac disease  . Adhesive [Tape] Other (See Comments)    Band-aids - tears skin off   Social History   Socioeconomic History  . Marital status: Married    Spouse name: Not on file  . Number of children: 3  . Years of education: Not on file  . Highest education level: Not on file  Occupational  History  . 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 Smoking status: Former Smoker    Packs/day: 2.00    Years: 35.00    Pack years: 70.00    Types: Cigarettes    Quit date: 01/21/1989    Years since quitting: 30.9  . Smokeless tobacco: Former Systems developer    Types: [place] date: 08/23/2001  Substance and Sexual Activity  . Alcohol use: No  . Drug use: No  . Sexual activity: Not on  file  Other Topics Concern  . Not on file  Social History Narrative  . Not on file   Social Determinants of Health   Financial Resource Strain:   . Difficulty of Paying Living Expenses:   Food Insecurity:   . Worried About Charity fundraiser in the Last Year:   . Arboriculturist in the Last Year:   Transportation Needs:   . Film/video editor (Medical):   Marland Kitchen Lack of Transportation (Non-Medical):   Physical Activity:   . Days of Exercise per Week:   . Minutes of Exercise per Session:   Stress:   . Feeling of Stress :   Social Connections:   . Frequency of Communication with Friends and Family:   . Frequency of Social Gatherings with Friends and Family:   . Attends Religious Services:   . Active Member of Clubs or Organizations:   . Attends Archivist Meetings:   Marland Kitchen Marital Status:   Intimate Partner Violence:   . Fear of Current or Ex-Partner:   . Emotionally Abused:   Marland Kitchen Physically Abused:   . Sexually Abused:       Review of Systems  All other systems reviewed and are negative.      Objective:   Physical Exam  HENT:  Right Ear: Tympanic membrane and ear canal normal.  Left Ear: Tympanic membrane and ear canal normal.  Nose: No mucosal edema, rhinorrhea, nose lacerations, sinus tenderness or nasal deformity. Epistaxis is observed.  No foreign bodies.  Cardiovascular: Normal rate, regular rhythm and normal heart sounds.  No murmur heard. Pulmonary/Chest: Effort normal. No accessory muscle usage. No respiratory  distress. He has decreased breath sounds. He has no wheezes. He exhibits no tenderness.  Abdominal: Soft. Bowel sounds are normal.  Musculoskeletal:        General: No edema.  Vitals reviewed.        Assessment & Plan:   Epistaxis  The area where the nosebleed originating from had spontaneously stopped bleeding on its own.  I recommended that he apply Afrin to that area 2 times a day on a cottonball to release 5 minutes to cause vasoconstriction and prevent bleeding.  I also recommended that he gently apply a small amount of Vaseline to the area to lubricate the skin and make it more pliable so that is less likely to bleed.  I would do this for the next 2 to 3 days.  Also recommended that he stop his aspirin.  I also recommended that he avoid blowing his nose.  If the bleeding returns, we can attempt chemical cautery with silver nitrate or packing.  However I would avoid packing as the patient is O2 dependent and frequently feels like suffocating he is not able to breathe through his nose.

## 2019-12-29 DIAGNOSIS — J449 Chronic obstructive pulmonary disease, unspecified: Secondary | ICD-10-CM | POA: Diagnosis not present

## 2019-12-31 DIAGNOSIS — H6123 Impacted cerumen, bilateral: Secondary | ICD-10-CM | POA: Diagnosis not present

## 2020-01-07 ENCOUNTER — Ambulatory Visit: Payer: Medicare PPO | Admitting: Internal Medicine

## 2020-01-13 DIAGNOSIS — J449 Chronic obstructive pulmonary disease, unspecified: Secondary | ICD-10-CM | POA: Diagnosis not present

## 2020-01-14 ENCOUNTER — Ambulatory Visit (INDEPENDENT_AMBULATORY_CARE_PROVIDER_SITE_OTHER): Payer: Medicare PPO | Admitting: Family Medicine

## 2020-01-14 DIAGNOSIS — J449 Chronic obstructive pulmonary disease, unspecified: Secondary | ICD-10-CM | POA: Diagnosis not present

## 2020-01-14 DIAGNOSIS — J4489 Other specified chronic obstructive pulmonary disease: Secondary | ICD-10-CM

## 2020-01-14 NOTE — Progress Notes (Signed)
Subjective:    Patient ID: Bruce Travis, male    DOB: 1934/09/24, 85 y.o.   MRN: [id number]  HPI  12/20/19  Patient presents today with shortness of breath.  He states that for the last several months he is struggling to catch his breath.  He has severe oxygen dependent COPD.  However today on examination, he has good air exchange in all 4 lung fields.  There is no appreciable wheezing.  There are no crackles or rails or rhonchorous breath sounds.  Patient is far above his baseline today.  He states that he feels like he cannot pull enough air through his nose.  He states that he feels like his nose is obstructed.  However he is on Astelin as well as Flonase as well as Singulair.  On visual inspection today there is no obvious obstruction in either nostril.  He denies any sinus pain.  He denies any head congestion.  He occasionally has some postnasal drip.  He does have a history of congestive heart failure however his lungs show no evidence of pulmonary edema today and he has no peripheral edema on exam.  Differential diagnosis includes upper airway obstruction due to mucous, COPD, anxiety related dyspnea, or possibly congestive heart failure related dyspnea.  Based on his exam today, I favor more likely that this is anxiety related given its chronic nature.  I believe the patient has chronic air hunger and his anxiety is making this worse.  At present he is using Xanax 0.25 mg twice daily and he does see temporary relief from his dyspnea when he uses this.  At that time, my plan was: Patient has multifactorial dyspnea.  Differential diagnosis includes COPD, nasal obstruction due to mucous from allergies, anxiety related dyspnea, or possibly congestive heart failure.  However my clinical instinct favors anxiety is most likely cause.  The patient seems anxious.  I believe that the adrenergic excess from his inhalers is likely exacerbating this.  Therefore I would like to try increasing his Xanax and strength  and frequency over the weekend to see if this helps his subjective dyspnea.  Objectively on his exam he appears to be at his baseline.  Therefore we will increase his Xanax to 0.5 mg 3 times daily and he will call me Monday with an update.  If his dyspnea improved by increasing the Xanax, we can try to find a more long-term solution for him.  01/14/20 Patient is being seen as a telephone visit today.  Phone call began at 335.  Phone call concluded at 350.  Patient consents to be seen over the telephone.  He believes that the Xanax was helpful to him.  It did help with some of the air hunger and shortness of breath.  However he was unable to tolerate a higher dose of 0.5 mg 3 times a day.  He states that it made him feel "drunk".  However he is now taking 0.25 mg 3 times a day seems to be doing better on that.  His subjective feelings of dyspnea have improved although they are still present.  He has been seeing a pulmonologist in [place] for quite some time.  However as he is getting older transportation is becoming difficult for him.  He has a difficult time driving to [place] from his home and finding transportation to the appointments in [place].  [place] more comfortable [place] roads.  He would like to see a  pulmonologist in [place] if possible.  He specifically request Dr. Halford Chessman who was highly recommended to him by one of his friends  Past Medical History:  Diagnosis Date  . Allergic rhinitis   . Anxiety   . Bronchiectasis   . CAD (coronary artery disease)   . Celiac disease   . Chronic heart failure ([place])   . Chronic kidney disease (CKD), stage III (moderate)   . COPD (chronic obstructive pulmonary disease) ([place])   . Diverticulosis of colon (without mention of hemorrhage)   . Esophageal reflux   . Family history of adverse reaction to anesthesia    daughter gets PONV  . Family hx of colon cancer   . History of stomach ulcers 1980s  .  Hyperlipemia   . Hypertension   . 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 Myocardial infarction [facility])    "previous MI/echo in 12/2015"  . Nephrolithiasis    "got them now; never had OR/scopes" (02/03/2016)  . On home oxygen therapy    "3L-5L; 24/7" (12/29/2017)  . Other diseases of lung, not elsewhere classified   . Personal history of colonic polyps 04/26/2007   hyperplastic   . Pneumonia "several times"   Past Surgical History:  Procedure Laterality Date  . CARDIAC CATHETERIZATION  02/03/2016  . CARDIAC CATHETERIZATION  09/30/2009   non-obstructive CAD w/30% narrowing in prox LAD (Dr. Waunita Schooner)  . CARDIAC CATHETERIZATION  05/04/2001   same at 2011 cath (Dr. Waunita Schooner)  . CARDIAC CATHETERIZATION N/A 02/03/2016   Procedure: Right/Left Heart Cath and Coronary Angiography;  Surgeon: Pixie Casino, MD;  Location: [facility];  Service: Cardiovascular;  Laterality: N/A;  . EPIGLOTOPLASTY W/ MLB     removal due to carcinoma  . EXCISIONAL HEMORRHOIDECTOMY  2000s  . HAND SURGERY Right 1958   d/t crush injury  . TONSILLECTOMY AND ADENOIDECTOMY    . ULTRASOUND GUIDANCE FOR VASCULAR ACCESS  02/03/2016   Procedure: Ultrasound Guidance For Vascular Access;  Surgeon: Pixie Casino, MD;  Location: [facility];  Service: Cardiovascular;;  . VASECTOMY     Current Outpatient Medications on File Prior to Visit  Medication Sig Dispense Refill  . acetaminophen (TYLENOL) 325 MG tablet Take 2 tablets (650 mg total) by mouth every 6 (six) hours as needed for fever.    Marland Kitchen albuterol (PROVENTIL) (2.5 MG/3ML) 0.083% nebulizer solution INHALE 1 VIAL VIA NEBULIZER EVERY 4 HOURS AS NEEDED FOR WHEEZING OR SHORTNESS OF BREATH. 525 mL 0  . albuterol (VENTOLIN HFA) 108 (90 Base) MCG/ACT inhaler INHALE 2 PUFFS EVERY 4 HOURS AS NEEDED FOR SHORTNESS OF BREATH. 25.5 g 0  . ALPRAZolam (XANAX) 0.25 MG tablet TAKE ONE TABLET BY MOUTH TID AS NEEDED FOR ANXIETY. 90 tablet 0  .  amiodarone (PACERONE) 200 MG tablet TAKE 1/2 TABLET BY MOUTH ONCE DAILY. 45 tablet 3  . aspirin EC 81 MG tablet Take 81 mg by mouth daily.    Marland Kitchen atorvastatin (LIPITOR) 20 MG tablet TAKE ONE TABLET BY MOUTH ONCE DAILY. 90 tablet 3  . azelastine (ASTELIN) 0.1 % nasal spray USE 2 SPRAYS IN EACH NOSTRILS TWICE DAILY AS DIRECTED. 30 mL 2  . calcitonin, salmon, (MIACALCIN/FORTICAL) 200 UNIT/ACT nasal spray Place 1 spray into alternate nostrils daily. Every other day 3.7 mL 0  . Calcium Carbonate-Vitamin D (CALTRATE 600+D) 600-400 MG-UNIT tablet Take 1 tablet by mouth daily.     Marland Kitchen dextromethorphan-guaiFENesin (MUCINEX DM) 30-600 MG 12hr tablet  Take 1 tablet by mouth 2 (two) times daily.    Marland Kitchen ENTRESTO 49-51 MG TAKE (1) TABLET BY MOUTH TWICE DAILY. 180 tablet 3  . fluticasone (FLONASE) 50 MCG/ACT nasal spray Place 1-2 sprays into both nostrils at bedtime. 18.2 mL 5  . Fluticasone-Umeclidin-Vilant (TRELEGY ELLIPTA) 100-62.5-25 MCG/INH AEPB Inhale 1 puff into the lungs daily. 60 each 5  . HYDROcodone-acetaminophen (NORCO/VICODIN) 5-325 MG tablet Take 1 tablet by mouth every 6 (six) hours as needed for moderate pain. 20 tablet 0  . LUTEIN PO Take 1 capsule by mouth daily.    . meclizine (ANTIVERT) 25 MG tablet TAKE (1) TABLET BY MOUTH THREE TIMES DAILY AS NEEDED FOR DIZZINESS. 30 tablet 0  . montelukast (SINGULAIR) 10 MG tablet TAKE 1 TABLET BY MOUTH AT BEDTIME. 90 tablet 3  . Multiple Vitamins-Minerals (CENTRUM SILVER 50+MEN) TABS Take 1 tablet by mouth daily.    . NON FORMULARY Diet Type:  Gluten Free    . OXYGEN Inhale 4-6 L/min into the lungs continuous. To maintain sats > 88%    . pantoprazole (PROTONIX) 40 MG tablet TAKE ONE TABLET BY MOUTH DAILY. 90 tablet 0  . polyethylene glycol (MIRALAX / GLYCOLAX) packet Take 17 g by mouth daily as needed for mild constipation. Mix in 8 oz liquid and drink    . predniSONE (DELTASONE) 5 MG tablet TAKE 1 TABLET BY MOUTH DAILY WITH BREAKFAST. 90 tablet 0  .  spironolactone (ALDACTONE) 25 MG tablet TAKE (1/2) TABLET BY MOUTH DAILY. 45 tablet 3  . tamsulosin (FLOMAX) 0.4 MG CAPS capsule TAKE ONE CAPSULE BY MOUTH ONCE DAILY. 90 capsule 3   No current facility-administered medications on file prior to visit.   Allergies  Allergen Reactions  . Codeine Other (See Comments)    Thought head was going to blow off/ severe headache  . Gluten Meal Other (See Comments)    Celiac disease  . Adhesive [Tape] Other (See Comments)    Band-aids - tears skin off   Social History   Socioeconomic History  . Marital status: Married    Spouse name: Not on file  . Number of children: 3  . Years of education: Not on file  . Highest education level: Not on file  Occupational History  . 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 Smoking status: Former Smoker    Packs/day: 2.00    Years: 35.00    Pack years: 70.00    Types: Cigarettes    Quit date: 01/21/1989    Years since quitting: 31.0  . Smokeless tobacco: Former Systems developer    Types: [place] date: 08/23/2001  Substance and Sexual Activity  . Alcohol use: No  . Drug use: No  . Sexual activity: Not on file  Other Topics Concern  . Not on file  Social History Narrative  . Not on file   Social Determinants of Health   Financial Resource Strain:   . Difficulty of Paying Living Expenses:   Food Insecurity:   . Worried About Charity fundraiser in the Last Year:   . Arboriculturist in the Last Year:   Transportation Needs:   . Film/video editor (Medical):   Marland Kitchen Lack of Transportation (Non-Medical):   Physical Activity:   . Days of Exercise per Week:   . Minutes of Exercise per Session:   Stress:   . Feeling of Stress :  Social Connections:   . Frequency of Communication with Friends and Family:   . Frequency of Social Gatherings with Friends and Family:   . Attends Religious Services:   . Active Member of Clubs or Organizations:   .  Attends Archivist Meetings:   Marland Kitchen Marital Status:   Intimate Partner Violence:   . Fear of Current or Ex-Partner:   . Emotionally Abused:   Marland Kitchen Physically Abused:   . Sexually Abused:       Review of Systems  All other systems reviewed and are negative.      Objective:         Assessment & Plan:  Obstructive chronic bronchitis without exacerbation ([place]) - Plan: Ambulatory referral to Pulmonology  I have done everything I know to do to try to help the patient with regards to his emphysema and shortness of breath.  I still feel that anxiety is playing a role in some of his dyspnea as mentioned earlier however I do believe the patient benefits from seeing his pulmonologist.  Unfortunately he feels very uncomfortable driving [place] as he has gotten older.  He would feel more comfortable going to [place] just because of its proximity to his home and the travel time.  Therefore I will see if he can transfer his care to Dr. Halford Chessman in [employer] [employer].

## 2020-01-18 ENCOUNTER — Ambulatory Visit: Payer: Medicare PPO | Admitting: Internal Medicine

## 2020-01-18 ENCOUNTER — Telehealth: Payer: Self-pay | Admitting: Family Medicine

## 2020-01-18 NOTE — Telephone Encounter (Signed)
Bruce Travis, patient of Dr. Melvyn Novas would like for Dr. Dennard Schaumann referral him to a Pulmonary doctor in [place] due to traffic being too heavy to drive in [place], would like to see Doctor. closer to home.

## 2020-01-18 NOTE — Telephone Encounter (Signed)
#  CB [phone] Pt see Dr.[name] would like for Dr.Pickard referral him to a Pulmonary doctor in [place] due to traffic to heavy drive in [place]  would like see doctor closer to home.

## 2020-01-22 NOTE — Telephone Encounter (Signed)
I had placed order to see Dr. Llana Aliment in [place].  Not sure what happened?

## 2020-01-22 NOTE — Telephone Encounter (Signed)
Spoke with patient and informed him that referral has been placed. Patient verbalized understanding.

## 2020-01-25 ENCOUNTER — Other Ambulatory Visit: Payer: Self-pay

## 2020-01-25 ENCOUNTER — Inpatient Hospital Stay (HOSPITAL_COMMUNITY)
Admission: EM | Admit: 2020-01-25 | Discharge: 2020-02-21 | DRG: 208 | Disposition: E | Payer: Medicare PPO | Attending: Critical Care Medicine | Admitting: Critical Care Medicine

## 2020-01-25 ENCOUNTER — Emergency Department (HOSPITAL_COMMUNITY): Payer: Medicare PPO

## 2020-01-25 ENCOUNTER — Encounter (HOSPITAL_COMMUNITY): Payer: Self-pay | Admitting: Emergency Medicine

## 2020-01-25 DIAGNOSIS — Z91048 Other nonmedicinal substance allergy status: Secondary | ICD-10-CM | POA: Diagnosis not present

## 2020-01-25 DIAGNOSIS — I469 Cardiac arrest, cause unspecified: Secondary | ICD-10-CM

## 2020-01-25 DIAGNOSIS — I4510 Unspecified right bundle-branch block: Secondary | ICD-10-CM | POA: Diagnosis present

## 2020-01-25 DIAGNOSIS — J90 Pleural effusion, not elsewhere classified: Secondary | ICD-10-CM | POA: Diagnosis not present

## 2020-01-25 DIAGNOSIS — Z7982 Long term (current) use of aspirin: Secondary | ICD-10-CM

## 2020-01-25 DIAGNOSIS — J9690 Respiratory failure, unspecified, unspecified whether with hypoxia or hypercapnia: Secondary | ICD-10-CM | POA: Diagnosis present

## 2020-01-25 DIAGNOSIS — Z8521 Personal history of malignant neoplasm of larynx: Secondary | ICD-10-CM

## 2020-01-25 DIAGNOSIS — J440 Chronic obstructive pulmonary disease with acute lower respiratory infection: Secondary | ICD-10-CM | POA: Diagnosis present

## 2020-01-25 DIAGNOSIS — Z8249 Family history of ischemic heart disease and other diseases of the circulatory system: Secondary | ICD-10-CM

## 2020-01-25 DIAGNOSIS — J9602 Acute respiratory failure with hypercapnia: Secondary | ICD-10-CM | POA: Diagnosis present

## 2020-01-25 DIAGNOSIS — X58XXXA Exposure to other specified factors, initial encounter: Secondary | ICD-10-CM | POA: Diagnosis present

## 2020-01-25 DIAGNOSIS — E872 Acidosis: Secondary | ICD-10-CM | POA: Diagnosis present

## 2020-01-25 DIAGNOSIS — R0603 Acute respiratory distress: Secondary | ICD-10-CM

## 2020-01-25 DIAGNOSIS — Z66 Do not resuscitate: Secondary | ICD-10-CM | POA: Diagnosis not present

## 2020-01-25 DIAGNOSIS — J80 Acute respiratory distress syndrome: Secondary | ICD-10-CM | POA: Diagnosis not present

## 2020-01-25 DIAGNOSIS — J9601 Acute respiratory failure with hypoxia: Secondary | ICD-10-CM | POA: Diagnosis not present

## 2020-01-25 DIAGNOSIS — Z7952 Long term (current) use of systemic steroids: Secondary | ICD-10-CM

## 2020-01-25 DIAGNOSIS — F419 Anxiety disorder, unspecified: Secondary | ICD-10-CM | POA: Diagnosis present

## 2020-01-25 DIAGNOSIS — Z20822 Contact with and (suspected) exposure to covid-19: Secondary | ICD-10-CM | POA: Diagnosis present

## 2020-01-25 DIAGNOSIS — I252 Old myocardial infarction: Secondary | ICD-10-CM

## 2020-01-25 DIAGNOSIS — J471 Bronchiectasis with (acute) exacerbation: Secondary | ICD-10-CM | POA: Diagnosis present

## 2020-01-25 DIAGNOSIS — Z885 Allergy status to narcotic agent status: Secondary | ICD-10-CM | POA: Diagnosis not present

## 2020-01-25 DIAGNOSIS — N1832 Chronic kidney disease, stage 3b: Secondary | ICD-10-CM | POA: Diagnosis present

## 2020-01-25 DIAGNOSIS — Z9981 Dependence on supplemental oxygen: Secondary | ICD-10-CM

## 2020-01-25 DIAGNOSIS — Z823 Family history of stroke: Secondary | ICD-10-CM

## 2020-01-25 DIAGNOSIS — Z8711 Personal history of peptic ulcer disease: Secondary | ICD-10-CM

## 2020-01-25 DIAGNOSIS — Z86010 Personal history of colonic polyps: Secondary | ICD-10-CM

## 2020-01-25 DIAGNOSIS — R0689 Other abnormalities of breathing: Secondary | ICD-10-CM | POA: Diagnosis not present

## 2020-01-25 DIAGNOSIS — Z79899 Other long term (current) drug therapy: Secondary | ICD-10-CM | POA: Diagnosis not present

## 2020-01-25 DIAGNOSIS — Z87442 Personal history of urinary calculi: Secondary | ICD-10-CM

## 2020-01-25 DIAGNOSIS — J441 Chronic obstructive pulmonary disease with (acute) exacerbation: Secondary | ICD-10-CM | POA: Diagnosis not present

## 2020-01-25 DIAGNOSIS — I5022 Chronic systolic (congestive) heart failure: Secondary | ICD-10-CM | POA: Diagnosis present

## 2020-01-25 DIAGNOSIS — R579 Shock, unspecified: Secondary | ICD-10-CM | POA: Diagnosis present

## 2020-01-25 DIAGNOSIS — J9622 Acute and chronic respiratory failure with hypercapnia: Secondary | ICD-10-CM | POA: Diagnosis present

## 2020-01-25 DIAGNOSIS — I255 Ischemic cardiomyopathy: Secondary | ICD-10-CM | POA: Diagnosis present

## 2020-01-25 DIAGNOSIS — Z8042 Family history of malignant neoplasm of prostate: Secondary | ICD-10-CM

## 2020-01-25 DIAGNOSIS — K219 Gastro-esophageal reflux disease without esophagitis: Secondary | ICD-10-CM | POA: Diagnosis present

## 2020-01-25 DIAGNOSIS — R0902 Hypoxemia: Secondary | ICD-10-CM | POA: Diagnosis not present

## 2020-01-25 DIAGNOSIS — I130 Hypertensive heart and chronic kidney disease with heart failure and stage 1 through stage 4 chronic kidney disease, or unspecified chronic kidney disease: Secondary | ICD-10-CM | POA: Diagnosis present

## 2020-01-25 DIAGNOSIS — J189 Pneumonia, unspecified organism: Secondary | ICD-10-CM | POA: Diagnosis present

## 2020-01-25 DIAGNOSIS — R000 Tachycardia, unspecified: Secondary | ICD-10-CM | POA: Diagnosis not present

## 2020-01-25 DIAGNOSIS — R739 Hyperglycemia, unspecified: Secondary | ICD-10-CM | POA: Diagnosis present

## 2020-01-25 DIAGNOSIS — N179 Acute kidney failure, unspecified: Secondary | ICD-10-CM | POA: Diagnosis present

## 2020-01-25 DIAGNOSIS — R0602 Shortness of breath: Secondary | ICD-10-CM | POA: Diagnosis not present

## 2020-01-25 DIAGNOSIS — R001 Bradycardia, unspecified: Secondary | ICD-10-CM | POA: Diagnosis not present

## 2020-01-25 DIAGNOSIS — Z91018 Allergy to other foods: Secondary | ICD-10-CM

## 2020-01-25 DIAGNOSIS — Z800 Family history of malignant neoplasm of digestive organs: Secondary | ICD-10-CM

## 2020-01-25 DIAGNOSIS — Z87891 Personal history of nicotine dependence: Secondary | ICD-10-CM

## 2020-01-25 DIAGNOSIS — I2510 Atherosclerotic heart disease of native coronary artery without angina pectoris: Secondary | ICD-10-CM | POA: Diagnosis present

## 2020-01-25 DIAGNOSIS — R7303 Prediabetes: Secondary | ICD-10-CM | POA: Diagnosis present

## 2020-01-25 DIAGNOSIS — K900 Celiac disease: Secondary | ICD-10-CM | POA: Diagnosis present

## 2020-01-25 DIAGNOSIS — I468 Cardiac arrest due to other underlying condition: Secondary | ICD-10-CM | POA: Diagnosis present

## 2020-01-25 DIAGNOSIS — S41112A Laceration without foreign body of left upper arm, initial encounter: Secondary | ICD-10-CM | POA: Diagnosis present

## 2020-01-25 DIAGNOSIS — E785 Hyperlipidemia, unspecified: Secondary | ICD-10-CM | POA: Diagnosis present

## 2020-01-25 DIAGNOSIS — R092 Respiratory arrest: Secondary | ICD-10-CM | POA: Diagnosis present

## 2020-01-25 HISTORY — DX: Heart failure, unspecified: I50.9

## 2020-01-25 LAB — BRAIN NATRIURETIC PEPTIDE: B Natriuretic Peptide: 856.4 pg/mL — ABNORMAL HIGH (ref 0.0–100.0)

## 2020-01-25 LAB — I-STAT ARTERIAL BLOOD GAS, ED
Acid-base deficit: 6 mmol/L — ABNORMAL HIGH (ref 0.0–2.0)
Bicarbonate: 20.6 mmol/L (ref 20.0–28.0)
Calcium, Ion: 1.35 mmol/L (ref 1.15–1.40)
HCT: 42 % (ref 39.0–52.0)
Hemoglobin: 14.3 g/dL (ref 13.0–17.0)
O2 Saturation: 91 %
Patient temperature: 98.3
Potassium: 4.7 mmol/L (ref 3.5–5.1)
Sodium: 133 mmol/L — ABNORMAL LOW (ref 135–145)
TCO2: 22 mmol/L (ref 22–32)
pCO2 arterial: 45.4 mmHg (ref 32.0–48.0)
pH, Arterial: 7.265 — ABNORMAL LOW (ref 7.350–7.450)
pO2, Arterial: 69 mmHg — ABNORMAL LOW (ref 83.0–108.0)

## 2020-01-25 LAB — CBC WITH DIFFERENTIAL/PLATELET
Abs Immature Granulocytes: 0.04 10*3/uL (ref 0.00–0.07)
Basophils Absolute: 0 10*3/uL (ref 0.0–0.1)
Basophils Relative: 0 %
Eosinophils Absolute: 0 10*3/uL (ref 0.0–0.5)
Eosinophils Relative: 0 %
HCT: 44.8 % (ref 39.0–52.0)
Hemoglobin: 14.3 g/dL (ref 13.0–17.0)
Immature Granulocytes: 0 %
Lymphocytes Relative: 11 %
Lymphs Abs: 1.3 10*3/uL (ref 0.7–4.0)
MCH: 30.5 pg (ref 26.0–34.0)
MCHC: 31.9 g/dL (ref 30.0–36.0)
MCV: 95.5 fL (ref 80.0–100.0)
Monocytes Absolute: 1.3 10*3/uL — ABNORMAL HIGH (ref 0.1–1.0)
Monocytes Relative: 11 %
Neutro Abs: 9.8 10*3/uL — ABNORMAL HIGH (ref 1.7–7.7)
Neutrophils Relative %: 78 %
Platelets: 257 10*3/uL (ref 150–400)
RBC: 4.69 MIL/uL (ref 4.22–5.81)
RDW: 15.4 % (ref 11.5–15.5)
WBC: 12.4 10*3/uL — ABNORMAL HIGH (ref 4.0–10.5)
nRBC: 0 % (ref 0.0–0.2)

## 2020-01-25 LAB — BASIC METABOLIC PANEL
Anion gap: 14 (ref 5–15)
BUN: 56 mg/dL — ABNORMAL HIGH (ref 8–23)
CO2: 19 mmol/L — ABNORMAL LOW (ref 22–32)
Calcium: 9 mg/dL (ref 8.9–10.3)
Chloride: 99 mmol/L (ref 98–111)
Creatinine, Ser: 1.8 mg/dL — ABNORMAL HIGH (ref 0.61–1.24)
GFR calc Af Amer: 39 mL/min — ABNORMAL LOW (ref >60)
GFR calc non Af Amer: 34 mL/min — ABNORMAL LOW (ref >60)
Glucose, Bld: 132 mg/dL — ABNORMAL HIGH (ref 70–99)
Potassium: 5.6 mmol/L — ABNORMAL HIGH (ref 3.5–5.1)
Sodium: 132 mmol/L — ABNORMAL LOW (ref 135–145)

## 2020-01-25 LAB — URINALYSIS, ROUTINE W REFLEX MICROSCOPIC
Bilirubin Urine: NEGATIVE
Glucose, UA: NEGATIVE mg/dL
Ketones, ur: NEGATIVE mg/dL
Leukocytes,Ua: NEGATIVE
Nitrite: NEGATIVE
Protein, ur: NEGATIVE mg/dL
RBC / HPF: >50 RBC/hpf — ABNORMAL HIGH (ref 0–5)
Specific Gravity, Urine: 1.019 (ref 1.005–1.030)
pH: 5 (ref 5.0–8.0)

## 2020-01-25 LAB — TRIGLYCERIDES: Triglycerides: 68 mg/dL (ref <150)

## 2020-01-25 LAB — TROPONIN I (HIGH SENSITIVITY): Troponin I (High Sensitivity): 58 ng/L — ABNORMAL HIGH (ref <18)

## 2020-01-25 LAB — SARS CORONAVIRUS 2 BY RT PCR (HOSPITAL ORDER, PERFORMED IN ~~LOC~~ HOSPITAL LAB): SARS Coronavirus 2: NEGATIVE

## 2020-01-25 LAB — LACTIC ACID, PLASMA: Lactic Acid, Venous: 2.4 mmol/L (ref 0.5–1.9)

## 2020-01-25 MED ORDER — SODIUM CHLORIDE 0.9 % IV SOLN
1.0000 g | Freq: Once | INTRAVENOUS | Status: AC
  Administered 2020-01-25: 1 g via INTRAVENOUS
  Filled 2020-01-25: qty 10

## 2020-01-25 MED ORDER — FENTANYL BOLUS VIA INFUSION
25.0000 ug | INTRAVENOUS | Status: DC | PRN
  Administered 2020-01-25: 50 ug via INTRAVENOUS
  Filled 2020-01-25: qty 25

## 2020-01-25 MED ORDER — SODIUM BICARBONATE 8.4 % IV SOLN
INTRAVENOUS | Status: AC | PRN
  Administered 2020-01-25: 50 meq via INTRAVENOUS

## 2020-01-25 MED ORDER — ETOMIDATE 2 MG/ML IV SOLN
20.0000 mg | Freq: Once | INTRAVENOUS | Status: AC
  Administered 2020-01-25: 20 mg via INTRAVENOUS

## 2020-01-25 MED ORDER — SODIUM CHLORIDE 0.9 % IV SOLN
500.0000 mg | Freq: Once | INTRAVENOUS | Status: AC
  Administered 2020-01-25: 500 mg via INTRAVENOUS
  Filled 2020-01-25: qty 500

## 2020-01-25 MED ORDER — FENTANYL CITRATE (PF) 100 MCG/2ML IJ SOLN: 50.0000 ug | Freq: Once | INTRAMUSCULAR | Status: DC

## 2020-01-25 MED ORDER — SODIUM CHLORIDE 0.9 % IV BOLUS
2000.0000 mL | Freq: Once | INTRAVENOUS | Status: AC
  Administered 2020-01-25: 1000 mL via INTRAVENOUS

## 2020-01-25 MED ORDER — NOREPINEPHRINE 4 MG/250ML-% IV SOLN
INTRAVENOUS | Status: AC
  Filled 2020-01-25: qty 250

## 2020-01-25 MED ORDER — SODIUM CHLORIDE 0.9 % IV BOLUS (SEPSIS)
1000.0000 mL | Freq: Once | INTRAVENOUS | Status: AC
  Administered 2020-01-25: 1000 mL via INTRAVENOUS

## 2020-01-25 MED ORDER — PROPOFOL 1000 MG/100ML IV EMUL: 0.0000 ug/kg/min | INTRAVENOUS | Status: DC

## 2020-01-25 MED ORDER — FENTANYL 2500MCG IN NS 250ML (10MCG/ML) PREMIX INFUSION
25.0000 ug/h | INTRAVENOUS | Status: DC
  Administered 2020-01-25: 25 ug/h via INTRAVENOUS
  Filled 2020-01-25: qty 250

## 2020-01-25 MED ORDER — EPINEPHRINE 1 MG/10ML IJ SOSY
PREFILLED_SYRINGE | INTRAMUSCULAR | Status: AC | PRN
  Administered 2020-01-25 (×2): 1 mg via INTRAVENOUS

## 2020-01-25 MED ORDER — SODIUM CHLORIDE 0.9 % IV SOLN
1000.0000 mL | INTRAVENOUS | Status: DC
  Administered 2020-01-25: 1000 mL via INTRAVENOUS

## 2020-01-25 MED ORDER — PROPOFOL 1000 MG/100ML IV EMUL
INTRAVENOUS | Status: AC
  Administered 2020-01-25: 20 ug/kg/min via INTRAVENOUS
  Filled 2020-01-25: qty 100

## 2020-01-25 MED ORDER — SODIUM CHLORIDE 0.9 % IV SOLN
250.0000 mL | INTRAVENOUS | Status: DC
  Administered 2020-01-26: 250 mL via INTRAVENOUS

## 2020-01-25 MED ORDER — SUCCINYLCHOLINE CHLORIDE 20 MG/ML IJ SOLN
100.0000 mg | Freq: Once | INTRAMUSCULAR | Status: AC
  Administered 2020-01-25: 100 mg via INTRAVENOUS

## 2020-01-25 MED ORDER — CALCIUM CHLORIDE 10 % IV SOLN
INTRAVENOUS | Status: AC | PRN
  Administered 2020-01-25: 1 g via INTRAVENOUS

## 2020-01-25 MED ORDER — PHENYLEPHRINE HCL-NACL 10-0.9 MG/250ML-% IV SOLN: 25.0000 ug/min | INTRAVENOUS | Status: DC

## 2020-01-25 NOTE — ED Notes (Signed)
Critical MD explained patient's condition with patient's spouse at bedside , admission plan and plan of care .

## 2020-01-25 NOTE — ED Notes (Signed)
Blood specimens/blood cultures collected by phlebotomists.

## 2020-01-25 NOTE — ED Notes (Signed)
EDP explained admission plan /plan of care to patient's spouse at bedside , NS IV bolus infusing , Propofol drip stopped due to hypotension , fentanyl drip continues , Rocephin IV infusing , IV sites intact , OGT/Foley catheter /ETT intact .

## 2020-01-25 NOTE — ED Triage Notes (Signed)
Patient arrived with EMS from home on CPAP worsening SOB this week , intubated by EDP at arrival with RT / pharmacist .

## 2020-01-25 NOTE — ED Provider Notes (Signed)
[facility] EMERGENCY DEPARTMENT Provider Note   CSN: [id number] Arrival date & time: 02/04/2020  2127   Level 5 caveat: Acuity of his condition.  Patient was intubated shortly upon arrival  History Chief Complaint  Patient presents with  . Respiratory Distress    Bruce Travis is a 85 y.o. male.  HPI   Patient presented to the ED in respiratory distress.  Patient has a history of CHF and COPD.  According to EMS he has been having increasing shortness of breath over the past week.  When EMS arrived they noted the patient was having significant respiratory difficulty.  His initial oxygen saturations were in the 60s.  They started him on supplemental oxygen as well as CPAP.  His saturations only improved into the 80s.  On arrival the patient complained of thirst.  Unknown if he has had any fevers.  He denied any chest pain.  Past Medical History:  Diagnosis Date  . Allergic rhinitis   . Anxiety   . Bronchiectasis   . CAD (coronary artery disease)   . Celiac disease   . CHF (congestive heart failure) ([place])   . Chronic heart failure ([place])   . Chronic kidney disease (CKD), stage III (moderate)   . COPD (chronic obstructive pulmonary disease) ([place])   . Diverticulosis of colon (without mention of hemorrhage)   . Esophageal reflux   . Family history of adverse reaction to anesthesia    daughter gets PONV  . Family hx of colon cancer   . History of stomach ulcers 1980s  . Hyperlipemia   . Hypertension   . 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 Myocardial infarction [facility])    "previous MI/echo in 12/2015"  . Nephrolithiasis    "got them now; never had OR/scopes" (02/03/2016)  . On home oxygen therapy    "3L-5L; 24/7" (12/29/2017)  . Other diseases of lung, not elsewhere classified   . Personal history of colonic polyps 04/26/2007   hyperplastic   . Pneumonia "several times"    Patient Active Problem List   Diagnosis Date Noted  .  Bronchiectasis ([place]) 10/08/2019  . Dyslipidemia 11/04/2018  . Chronic anxiety 11/04/2018  . CAP (community acquired pneumonia) 10/12/2018  . Chronic respiratory failure with hypoxia ([place]) 03/02/2017  . CAD (coronary artery disease)   . Chronic kidney disease (CKD), stage III (moderate) (HCC)   . Heart failure ([place]) 02/03/2016  . Cardiomyopathy, ischemic 01/06/2016  . History of acute inferior wall MI 01/06/2016  . PVC's (premature ventricular contractions) 12/12/2015  . Insomnia 04/16/2015  . Acute respiratory failure with hypoxia ([place]) 01/10/2012  . Allergic rhinitis 01/10/2012  . Hypertension 01/10/2012  . Influenza A 08/11/2011  . COPD exacerbation ([place]) 08/11/2011  . Dysphagia 04/13/2011  . Hoarseness 04/02/2011  . Celiac disease 12/15/2010  . ARTHUS PHENOMENON 10/05/2007  . Multiple pulmonary nodules determined by computed tomography of lung assoc with bronchiectasis 08/11/2007  . COPD GOLD II  07/06/2007  . GERD 07/06/2007    Past Surgical History:  Procedure Laterality Date  . CARDIAC CATHETERIZATION  02/03/2016  . CARDIAC CATHETERIZATION  09/30/2009   non-obstructive CAD w/30% narrowing in prox LAD (Dr. Waunita Schooner)  . CARDIAC CATHETERIZATION  05/04/2001   same at 2011 cath (Dr. Waunita Schooner)  . CARDIAC CATHETERIZATION N/A 02/03/2016   Procedure: Right/Left Heart Cath and Coronary Angiography;  Surgeon: Pixie Casino, MD;  Location: [place] [facility];  Service: Cardiovascular;  Laterality:  N/A;  . EPIGLOTOPLASTY W/ MLB     removal due to carcinoma  . EXCISIONAL HEMORRHOIDECTOMY  2000s  . HAND SURGERY Right 1958   d/t crush injury  . TONSILLECTOMY AND ADENOIDECTOMY    . ULTRASOUND GUIDANCE FOR VASCULAR ACCESS  02/03/2016   Procedure: Ultrasound Guidance For Vascular Access;  Surgeon: Pixie Casino, MD;  Location: [place] [facility];  Service: Cardiovascular;;  . VASECTOMY         Family History  Problem Relation Age of Onset  . Heart disease Father   . Colon  cancer Father   . Prostate cancer Father   . Stroke Mother   . Endometrial cancer Sister   . Stroke Maternal Grandmother   . Cancer Paternal Grandmother     Social History   Tobacco Use  . Smoking status: Former Smoker    Packs/day: 2.00    Years: 35.00    Pack years: 70.00    Types: Cigarettes    Quit date: 01/21/1989    Years since quitting: 31.0  . Smokeless tobacco: Former Systems developer    Types: Chew    Quit date: 08/23/2001  Substance Use Topics  . Alcohol use: No  . Drug use: No    Home Medications Prior to Admission medications   Medication Sig Start Date End Date Taking? Authorizing Provider  acetaminophen (TYLENOL) 325 MG tablet Take 2 tablets (650 mg total) by mouth every 6 (six) hours as needed for fever. 10/17/18   Debbe Odea, MD  albuterol (PROVENTIL) (2.5 MG/3ML) 0.083% nebulizer solution INHALE 1 VIAL VIA NEBULIZER EVERY 4 HOURS AS NEEDED FOR WHEEZING OR SHORTNESS OF BREATH. Patient taking differently: Take 2.5 mg by nebulization every 4 (four) hours as needed for wheezing or shortness of breath.  10/23/19   Susy Frizzle, MD  albuterol (VENTOLIN HFA) 108 (90 Base) MCG/ACT inhaler INHALE 2 PUFFS EVERY 4 HOURS AS NEEDED FOR SHORTNESS OF BREATH. Patient taking differently: Inhale 2 puffs into the lungs every 4 (four) hours as needed for wheezing or shortness of breath.  10/16/19   Susy Frizzle, MD  ALPRAZolam (XANAX) 0.25 MG tablet TAKE ONE TABLET BY MOUTH TID AS NEEDED FOR ANXIETY. Patient taking differently: Take 0.25 mg by mouth 3 (three) times daily as needed for anxiety.  12/27/19   Susy Frizzle, MD  amiodarone (PACERONE) 200 MG tablet TAKE 1/2 TABLET BY MOUTH ONCE DAILY. Patient taking differently: Take 100 mg by mouth daily.  12/19/19   Bensimhon, Shaune Pascal, MD  aspirin EC 81 MG tablet Take 81 mg by mouth daily.    [provider]  atorvastatin (LIPITOR) 20 MG tablet TAKE ONE TABLET BY MOUTH ONCE DAILY. 11/30/19   Bensimhon, Shaune Pascal, MD  azelastine  (ASTELIN) 0.1 % nasal spray USE 2 SPRAYS IN EACH NOSTRILS TWICE DAILY AS DIRECTED. Patient taking differently: Place 2 sprays into both nostrils 2 (two) times daily.  12/03/19   Susy Frizzle, MD  calcitonin, salmon, (MIACALCIN/FORTICAL) 200 UNIT/ACT nasal spray Place 1 spray into alternate nostrils daily. Every other day 10/31/18   Gerlene Fee, NP  Calcium Carbonate-Vitamin D (CALTRATE 600+D) 600-400 MG-UNIT tablet Take 1 tablet by mouth daily.     [provider]  dextromethorphan-guaiFENesin (MUCINEX DM) 30-600 MG 12hr tablet Take 1 tablet by mouth 2 (two) times daily.    [provider]  ENTRESTO 49-51 MG TAKE (1) TABLET BY MOUTH TWICE DAILY. Patient taking differently: Take 1 tablet by mouth 2 (two) times  daily.  12/19/19   Bensimhon, Shaune Pascal, MD  fluticasone (FLONASE) 50 MCG/ACT nasal spray Place 1-2 sprays into both nostrils at bedtime. 09/25/19   Martyn Ehrich, NP  Fluticasone-Umeclidin-Vilant (TRELEGY ELLIPTA) 100-62.5-25 MCG/INH AEPB Inhale 1 puff into the lungs daily. 09/25/19   Martyn Ehrich, NP  HYDROcodone-acetaminophen (NORCO/VICODIN) 5-325 MG tablet Take 1 tablet by mouth every 6 (six) hours as needed for moderate pain. 02/21/19   Alycia Rossetti, MD  LUTEIN PO Take 1 capsule by mouth daily.    [provider]  meclizine (ANTIVERT) 25 MG tablet TAKE (1) TABLET BY MOUTH THREE TIMES DAILY AS NEEDED FOR DIZZINESS. Patient taking differently: Take 25 mg by mouth 3 (three) times daily as needed for dizziness.  11/01/19   Susy Frizzle, MD  montelukast (SINGULAIR) 10 MG tablet TAKE 1 TABLET BY MOUTH AT BEDTIME. Patient taking differently: Take 10 mg by mouth at bedtime.  05/29/19   Susy Frizzle, MD  Multiple Vitamins-Minerals (CENTRUM SILVER 50+MEN) TABS Take 1 tablet by mouth daily.    [provider]  NON FORMULARY Diet Type:  Gluten Free 10/18/18   [provider]  OXYGEN Inhale 4-6 L/min into the lungs continuous. To  maintain sats > 88% 10/30/18   [provider]  pantoprazole (PROTONIX) 40 MG tablet TAKE ONE TABLET BY MOUTH DAILY. Patient taking differently: Take 40 mg by mouth daily.  12/20/19   Susy Frizzle, MD  polyethylene glycol (MIRALAX / Floria Raveling) packet Take 17 g by mouth daily as needed for mild constipation. Mix in 8 oz liquid and drink    [provider]  predniSONE (DELTASONE) 5 MG tablet TAKE 1 TABLET BY MOUTH DAILY WITH BREAKFAST. Patient taking differently: Take 5 mg by mouth daily with breakfast.  11/27/19   Susy Frizzle, MD  spironolactone (ALDACTONE) 25 MG tablet TAKE (1/2) TABLET BY MOUTH DAILY. Patient taking differently: Take 12.5 mg by mouth daily.  12/21/19   Bensimhon, Shaune Pascal, MD  tamsulosin (FLOMAX) 0.4 MG CAPS capsule TAKE ONE CAPSULE BY MOUTH ONCE DAILY. Patient taking differently: Take 0.4 mg by mouth daily.  05/29/19   Susy Frizzle, MD    Allergies    Codeine, Gluten meal, and Adhesive [tape]  Review of Systems   Review of Systems  Unable to perform ROS: Acuity of condition    Physical Exam Updated Vital Signs BP (!) 147/81 (BP Location: Right Arm)   Pulse 86   Temp 100.1 F (37.8 C)   Resp 18   Ht 1.727 m (5' 8" )   Wt 65 kg   SpO2 94%   BMI 21.79 kg/m   Physical Exam Vitals and nursing note reviewed.  Constitutional:      General: He is in acute distress.     Appearance: He is well-developed.  HENT:     Head: Normocephalic and atraumatic.     Right Ear: External ear normal.     Left Ear: External ear normal.     Mouth/Throat:     Pharynx: No oropharyngeal exudate or posterior oropharyngeal erythema.  Eyes:     General: No scleral icterus.       Right eye: No discharge.        Left eye: No discharge.     Conjunctiva/sclera: Conjunctivae normal.  Neck:     Trachea: No tracheal deviation.  Cardiovascular:     Rate and Rhythm: Regular rhythm. Tachycardia present.  Pulmonary:     Effort: Accessory muscle  usage and  respiratory distress present.     Breath sounds: No stridor. Rhonchi and rales present.     Comments: Tachypnea Abdominal:     General: Bowel sounds are normal. There is no distension.     Palpations: Abdomen is soft.     Tenderness: There is no abdominal tenderness. There is no guarding or rebound.  Musculoskeletal:        General: No tenderness.     Cervical back: Neck supple. No rigidity.     Right lower leg: No edema.     Left lower leg: No edema.  Skin:    General: Skin is warm and dry.     Findings: No rash.  Neurological:     Mental Status: He is alert.     Cranial Nerves: No cranial nerve deficit (no facial droop, extraocular movements intact, no slurred speech).     Sensory: No sensory deficit.     Motor: No abnormal muscle tone or seizure activity.     Coordination: Coordination normal.     ED Results / Procedures / Treatments   Labs (all labs ordered are listed, but only abnormal results are displayed) Labs Reviewed  BASIC METABOLIC PANEL - Abnormal; Notable for the following components:      Result Value   Sodium 132 (*)    Potassium 5.6 (*)    CO2 19 (*)    Glucose, Bld 132 (*)    BUN 56 (*)    Creatinine, Ser 1.80 (*)    GFR calc non Af Amer 34 (*)    GFR calc Af Amer 39 (*)    All other components within normal limits  BRAIN NATRIURETIC PEPTIDE - Abnormal; Notable for the following components:   B Natriuretic Peptide 856.4 (*)    All other components within normal limits  CBC WITH DIFFERENTIAL/PLATELET - Abnormal; Notable for the following components:   WBC 12.4 (*)    Neutro Abs 9.8 (*)    Monocytes Absolute 1.3 (*)    All other components within normal limits  LACTIC ACID, PLASMA - Abnormal; Notable for the following components:   Lactic Acid, Venous 2.4 (*)    All other components within normal limits  URINALYSIS, ROUTINE W REFLEX MICROSCOPIC - Abnormal; Notable for the following components:   APPearance HAZY (*)    Hgb urine dipstick LARGE (*)      RBC / HPF >50 (*)    Bacteria, UA RARE (*)    All other components within normal limits  I-STAT ARTERIAL BLOOD GAS, ED - Abnormal; Notable for the following components:   pH, Arterial 7.265 (*)    pO2, Arterial 69 (*)    Acid-base deficit 6.0 (*)    Sodium 133 (*)    All other components within normal limits  TROPONIN I (HIGH SENSITIVITY) - Abnormal; Notable for the following components:   Troponin I (High Sensitivity) 58 (*)    All other components within normal limits  SARS CORONAVIRUS 2 BY RT PCR (HOSPITAL ORDER, [facility])  GRAM STAIN  CULTURE, RESPIRATORY  CULTURE, BLOOD (ROUTINE X 2)  CULTURE, BLOOD (ROUTINE X 2)  TRIGLYCERIDES  LACTIC ACID, PLASMA  TROPONIN I (HIGH SENSITIVITY)    EKG EKG Interpretation  Date/Time:  Friday January 25 2020 21:30:20 EDT Ventricular Rate:  103 PR Interval:    QRS Duration: 184 QT Interval:  414 QTC Calculation: 542 R Axis:   -110 Text Interpretation: Sinus tachycardia Right bundle branch block Baseline  wander in lead(s) V2 No significant change since last tracing Confirmed by Dorie Rank [phone]) on 02/01/2020 9:58:37 PM   Radiology DG Chest Portable 1 View  Result Date: 01/27/2020 CLINICAL DATA:  Status post CPR. EXAM: PORTABLE CHEST 1 VIEW COMPARISON:  January 25, 2020 (9:40 p.m.) FINDINGS: There is stable endotracheal tube and nasogastric tube positioning. Mild, diffuse chronic appearing increased lung markings are noted. Mild areas of atelectasis and/or infiltrate are again seen within the bilateral lung bases. A stable calcified nodular opacity is seen overlying the lateral aspect of the right lung base. The left-sided pleural effusion seen on the prior study is not clearly visualized on the current exam. There is no evidence of a pneumothorax. The heart size and mediastinal contours are within normal limits. There is mild calcification of the aortic arch. Acute seventh, eighth and ninth left rib fractures are seen.  An acute fourth right rib fracture is also noted. IMPRESSION: 1. Nonvisualization of the left pleural effusion seen on the prior study, without additional significant interval changes when compared to the prior chest plain film dated January 25, 2020 (9:40 p.m.). Electronically Signed   By: Virgina Norfolk M.D.   On: 02/15/2020 23:39   DG Chest Portable 1 View  Result Date: 02/03/2020 CLINICAL DATA:  Shortness of breath, post intubation EXAM: PORTABLE CHEST 1 VIEW COMPARISON:  Portable exam 2140 hours compared to 06/25/2019 FINDINGS: Significantly rotated to the LEFT. Tip of endotracheal tube projects 6.9 cm above carina. Nasogastric tube tip is below diaphragm but the proximal side-port projects over distal esophagus, recommend advancing tube 5 cm. Normal heart size and mediastinal contours. Atherosclerotic calcification aorta. Emphysematous changes with calcified granuloma RIGHT base. LEFT pleural effusion with atelectasis versus consolidation in LEFT lower lobe. BILATERAL upper lobe scarring and RIGHT basilar atelectasis. No pneumothorax. Bones demineralized. IMPRESSION: Recommend advancing nasogastric tube 5 cm. COPD changes with RIGHT basilar atelectasis. Atelectasis versus consolidation LEFT lower lobe with associated LEFT pleural effusion. Electronically Signed   By: Lavonia Dana M.D.   On: 01/28/2020 21:47    Procedures .Critical Care Performed by: Dorie Rank, MD Authorized by: Dorie Rank, MD   Critical care provider statement:    Critical care time (minutes):  45   Critical care was time spent personally by me on the following activities:  Discussions with consultants, evaluation of patient's response to treatment, examination of patient, ordering and performing treatments and interventions, ordering and review of laboratory studies, ordering and review of radiographic studies, pulse oximetry, re-evaluation of patient's condition, obtaining history from patient or surrogate and review of old  charts Procedure Name: Intubation Date/Time: 01/28/2020 9:55 PM Performed by: Dorie Rank, MD Pre-anesthesia Checklist: Patient identified, Patient being monitored, Emergency Drugs available, Timeout performed and Suction available Oxygen Delivery Method: Non-rebreather mask Preoxygenation: Pre-oxygenation with 100% oxygen Induction Type: Rapid sequence Ventilation: Mask ventilation without difficulty Laryngoscope Size: Glidescope Grade View: Grade I Tube size: 7.5 mm Number of attempts: 1 Airway Equipment and Method: Video-laryngoscopy Placement Confirmation: ETT inserted through vocal cords under direct vision,  CO2 detector and Breath sounds checked- equal and bilateral Tube secured with: ETT holder    .Central Line  Date/Time: 01/22/2020 11:52 PM Performed by: Dorie Rank, MD Authorized by: Dorie Rank, MD   Consent:    Consent obtained:  Emergent situation Pre-procedure details:    Hand hygiene: Hand hygiene performed prior to insertion     Sterile barrier technique: All elements of maximal sterile technique followed     Skin preparation:  ChloraPrep   Skin preparation agent: Skin preparation agent completely dried prior to procedure   Procedure details:    Location:  R femoral   Procedural supplies:  Triple lumen   Landmarks identified: yes     Ultrasound guidance: yes     Sterile ultrasound techniques: Sterile gel and sterile probe covers were used     Number of attempts:  1   Successful placement: yes   Post-procedure details:    Post-procedure:  Dressing applied and line sutured   Assessment:  Blood return through all ports and free fluid flow   Patient tolerance of procedure:  Tolerated well, no immediate complications   (including critical care time)  Medications Ordered in ED Medications  fentaNYL (SUBLIMAZE) injection 50 mcg (50 mcg Intravenous Not Given 01/23/2020 2146)  fentaNYL 2579mg in NS 2582m(1024mml) infusion-PREMIX (25 mcg/hr Intravenous New Bag/Given  02/12/2020 2146)  fentaNYL (SUBLIMAZE) bolus via infusion 25 mcg (50 mcg Intravenous Bolus from Bag 02/12/2020 2148)  propofol (DIPRIVAN) 1000 MG/100ML infusion (0 mcg/kg/min  65 kg Intravenous Stopped 02/08/2020 2238)  azithromycin (ZITHROMAX) 500 mg in sodium chloride 0.9 % 250 mL IVPB (500 mg Intravenous New Bag/Given 02/10/2020 2323)  sodium chloride 0.9 % bolus 1,000 mL (1,000 mLs Intravenous New Bag/Given 02/15/2020 2234)    Followed by  0.9 %  sodium chloride infusion (1,000 mLs Intravenous New Bag/Given 02/13/2020 2235)  0.9 %  sodium chloride infusion (has no administration in time range)  phenylephrine (NEOSYNEPHRINE) 10-0.9 MG/250ML-% infusion (has no administration in time range)  norepinephrine (LEVOPHED) 4-5 MG/250ML-% infusion SOLN (40 mcg/min  Rate/Dose Change 02/03/2020 2319)  sodium chloride 0.9 % bolus 2,000 mL (has no administration in time range)  etomidate (AMIDATE) injection 20 mg (20 mg Intravenous Given 02/16/2020 2135)  succinylcholine (ANECTINE) injection 100 mg (100 mg Intravenous Given 02/07/2020 2135)  cefTRIAXone (ROCEPHIN) 1 g in sodium chloride 0.9 % 100 mL IVPB (0 g Intravenous Stopped 02/10/2020 2319)  EPINEPHrine (ADRENALIN) 1 MG/10ML injection (1 mg Intravenous Given 01/27/2020 2311)  sodium bicarbonate injection (50 mEq Intravenous Given 02/13/2020 2314)  calcium chloride injection (1 g Intravenous Given 01/24/2020 2315)    ED Course  I have reviewed the triage vital signs and the nursing notes.  Pertinent labs & imaging results that were available during my care of the patient were reviewed by me and considered in my medical decision making (see chart for details).  Clinical Course as of Jan 24 2350  Fri Jan 25, 2020  2158 Patient had significant respiratory distress and despite maximum supplemental oxygen and CPAP he remained in the mid 80s.  Patient was intubated for hypoxia   [JK]  2159 Sedative medications ordered.   [JK]  2159 Will start on antibiotics for possible pneumonia.   [JK]  2159 I  was notified the patient's blood pressure is decreased.  We will hold the propofol and I have ordered a fluid bolus   [JK]  2302 BP still low.  Will add peripheral vasopressors   [JK]  2350 Patient had an episode of cardiac arrest.  See code sheet for details.  Patient became more bradycardic desaturated and then went into a PEA arrest.  Patient underwent cardiopulmonary resuscitation.  He had high-quality CPR and was also given epinephrine.  Calcium and bicarb were also ordered.  Patient eventually regained spontaneous circulation.  Currently he is normotensive but he was started on a Levophed drip.  Central line was then placed by me.  Critical care services at the bedside  for admission.   [JK]    Clinical Course User Index [JK] Dorie Rank, MD   MDM Rules/Calculators/A&P                      Patient presented with acute respiratory distress.  Patient was intubated shortly after arrival.  Etiology of his respiratory difficulty possibly pneumonia versus COPD exacerbation.  CHF is also a consideration.  Patient noted to have acute kidney injury.  He also had respiratory acidosis.  BNP is elevated.  Patient has been started empiric antibiotics for pneumonia..  We will continue with pressors for now.  Plan on admission to the ICU for further treatment. Final Clinical Impression(s) / ED Diagnoses Final diagnoses:  Respiratory distress  COPD exacerbation ([place])  Pleural effusion  Cardiac arrest [facility])      Dorie Rank, MD 02/07/2020 2354

## 2020-01-25 NOTE — Code Documentation (Signed)
Pulses present , portable chest ordered , Levophed drip infusing at 50 mcg/min ,

## 2020-01-26 ENCOUNTER — Inpatient Hospital Stay (HOSPITAL_COMMUNITY): Payer: Medicare PPO

## 2020-01-26 ENCOUNTER — Other Ambulatory Visit (HOSPITAL_COMMUNITY): Payer: Medicare PPO

## 2020-01-26 DIAGNOSIS — Z9981 Dependence on supplemental oxygen: Secondary | ICD-10-CM | POA: Diagnosis not present

## 2020-01-26 DIAGNOSIS — Z66 Do not resuscitate: Secondary | ICD-10-CM | POA: Diagnosis not present

## 2020-01-26 DIAGNOSIS — Z885 Allergy status to narcotic agent status: Secondary | ICD-10-CM | POA: Diagnosis not present

## 2020-01-26 DIAGNOSIS — I4510 Unspecified right bundle-branch block: Secondary | ICD-10-CM | POA: Diagnosis present

## 2020-01-26 DIAGNOSIS — I469 Cardiac arrest, cause unspecified: Secondary | ICD-10-CM | POA: Diagnosis not present

## 2020-01-26 DIAGNOSIS — J440 Chronic obstructive pulmonary disease with acute lower respiratory infection: Secondary | ICD-10-CM | POA: Diagnosis present

## 2020-01-26 DIAGNOSIS — I468 Cardiac arrest due to other underlying condition: Secondary | ICD-10-CM | POA: Diagnosis present

## 2020-01-26 DIAGNOSIS — I130 Hypertensive heart and chronic kidney disease with heart failure and stage 1 through stage 4 chronic kidney disease, or unspecified chronic kidney disease: Secondary | ICD-10-CM | POA: Diagnosis present

## 2020-01-26 DIAGNOSIS — J189 Pneumonia, unspecified organism: Secondary | ICD-10-CM | POA: Diagnosis not present

## 2020-01-26 DIAGNOSIS — Z91018 Allergy to other foods: Secondary | ICD-10-CM | POA: Diagnosis not present

## 2020-01-26 DIAGNOSIS — N179 Acute kidney failure, unspecified: Secondary | ICD-10-CM | POA: Diagnosis present

## 2020-01-26 DIAGNOSIS — Z7982 Long term (current) use of aspirin: Secondary | ICD-10-CM | POA: Diagnosis not present

## 2020-01-26 DIAGNOSIS — E872 Acidosis: Secondary | ICD-10-CM | POA: Diagnosis present

## 2020-01-26 DIAGNOSIS — R739 Hyperglycemia, unspecified: Secondary | ICD-10-CM | POA: Diagnosis present

## 2020-01-26 DIAGNOSIS — J9622 Acute and chronic respiratory failure with hypercapnia: Secondary | ICD-10-CM | POA: Diagnosis present

## 2020-01-26 DIAGNOSIS — N1832 Chronic kidney disease, stage 3b: Secondary | ICD-10-CM | POA: Diagnosis present

## 2020-01-26 DIAGNOSIS — R092 Respiratory arrest: Secondary | ICD-10-CM | POA: Diagnosis present

## 2020-01-26 DIAGNOSIS — J9602 Acute respiratory failure with hypercapnia: Secondary | ICD-10-CM | POA: Diagnosis present

## 2020-01-26 DIAGNOSIS — R7303 Prediabetes: Secondary | ICD-10-CM | POA: Diagnosis present

## 2020-01-26 DIAGNOSIS — Z7952 Long term (current) use of systemic steroids: Secondary | ICD-10-CM | POA: Diagnosis not present

## 2020-01-26 DIAGNOSIS — J9601 Acute respiratory failure with hypoxia: Secondary | ICD-10-CM | POA: Diagnosis not present

## 2020-01-26 DIAGNOSIS — J9690 Respiratory failure, unspecified, unspecified whether with hypoxia or hypercapnia: Secondary | ICD-10-CM | POA: Diagnosis present

## 2020-01-26 DIAGNOSIS — Z20822 Contact with and (suspected) exposure to covid-19: Secondary | ICD-10-CM | POA: Diagnosis present

## 2020-01-26 DIAGNOSIS — Z91048 Other nonmedicinal substance allergy status: Secondary | ICD-10-CM | POA: Diagnosis not present

## 2020-01-26 DIAGNOSIS — R579 Shock, unspecified: Secondary | ICD-10-CM | POA: Diagnosis present

## 2020-01-26 DIAGNOSIS — X58XXXA Exposure to other specified factors, initial encounter: Secondary | ICD-10-CM | POA: Diagnosis present

## 2020-01-26 DIAGNOSIS — I5022 Chronic systolic (congestive) heart failure: Secondary | ICD-10-CM | POA: Diagnosis present

## 2020-01-26 DIAGNOSIS — Z79899 Other long term (current) drug therapy: Secondary | ICD-10-CM | POA: Diagnosis not present

## 2020-01-26 DIAGNOSIS — J471 Bronchiectasis with (acute) exacerbation: Secondary | ICD-10-CM | POA: Diagnosis present

## 2020-01-26 LAB — POCT I-STAT 7, (LYTES, BLD GAS, ICA,H+H)
Acid-base deficit: 13 mmol/L — ABNORMAL HIGH (ref 0.0–2.0)
Bicarbonate: 18.6 mmol/L — ABNORMAL LOW (ref 20.0–28.0)
Calcium, Ion: 1.31 mmol/L (ref 1.15–1.40)
HCT: 41 % (ref 39.0–52.0)
Hemoglobin: 13.9 g/dL (ref 13.0–17.0)
O2 Saturation: 87 %
Patient temperature: 36.4
Potassium: 5.3 mmol/L — ABNORMAL HIGH (ref 3.5–5.1)
Sodium: 137 mmol/L (ref 135–145)
TCO2: 21 mmol/L — ABNORMAL LOW (ref 22–32)
pCO2 arterial: 65.8 mmHg (ref 32.0–48.0)
pH, Arterial: 7.056 — CL (ref 7.350–7.450)
pO2, Arterial: 74 mmHg — ABNORMAL LOW (ref 83.0–108.0)

## 2020-01-26 LAB — CBC
HCT: 43.9 % (ref 39.0–52.0)
Hemoglobin: 13.3 g/dL (ref 13.0–17.0)
MCH: 30.7 pg (ref 26.0–34.0)
MCHC: 30.3 g/dL (ref 30.0–36.0)
MCV: 101.4 fL — ABNORMAL HIGH (ref 80.0–100.0)
Platelets: 195 10*3/uL (ref 150–400)
RBC: 4.33 MIL/uL (ref 4.22–5.81)
RDW: 15.6 % — ABNORMAL HIGH (ref 11.5–15.5)
WBC: 19.6 10*3/uL — ABNORMAL HIGH (ref 4.0–10.5)
nRBC: 0.1 % (ref 0.0–0.2)

## 2020-01-26 LAB — BASIC METABOLIC PANEL
Anion gap: 11 (ref 5–15)
BUN: 50 mg/dL — ABNORMAL HIGH (ref 8–23)
CO2: 19 mmol/L — ABNORMAL LOW (ref 22–32)
Calcium: 8.3 mg/dL — ABNORMAL LOW (ref 8.9–10.3)
Chloride: 106 mmol/L (ref 98–111)
Creatinine, Ser: 1.75 mg/dL — ABNORMAL HIGH (ref 0.61–1.24)
GFR calc Af Amer: 41 mL/min — ABNORMAL LOW (ref >60)
GFR calc non Af Amer: 35 mL/min — ABNORMAL LOW (ref >60)
Glucose, Bld: 196 mg/dL — ABNORMAL HIGH (ref 70–99)
Potassium: 4.9 mmol/L (ref 3.5–5.1)
Sodium: 136 mmol/L (ref 135–145)

## 2020-01-26 LAB — PROTIME-INR
INR: 1.5 — ABNORMAL HIGH (ref 0.8–1.2)
Prothrombin Time: 18 seconds — ABNORMAL HIGH (ref 11.4–15.2)

## 2020-01-26 LAB — MRSA PCR SCREENING: MRSA by PCR: NEGATIVE

## 2020-01-26 LAB — GLUCOSE, CAPILLARY
Glucose-Capillary: 210 mg/dL — ABNORMAL HIGH (ref 70–99)
Glucose-Capillary: 135 mg/dL — ABNORMAL HIGH (ref 70–99)

## 2020-01-26 LAB — TROPONIN I (HIGH SENSITIVITY): Troponin I (High Sensitivity): 412 ng/L (ref <18)

## 2020-01-26 LAB — ABO/RH: ABO/RH(D): O POS

## 2020-01-26 LAB — HEMOGLOBIN A1C
Hgb A1c MFr Bld: 6 % — ABNORMAL HIGH (ref 4.8–5.6)
Mean Plasma Glucose: 125.5 mg/dL

## 2020-01-26 LAB — CBG MONITORING, ED: Glucose-Capillary: 169 mg/dL — ABNORMAL HIGH (ref 70–99)

## 2020-01-26 MED ORDER — ENOXAPARIN SODIUM 30 MG/0.3ML ~~LOC~~ SOLN: 30.0000 mg | SUBCUTANEOUS | Status: DC

## 2020-01-26 MED ORDER — SODIUM CHLORIDE 0.9 % IV SOLN
2.0000 g | INTRAVENOUS | Status: DC
  Filled 2020-01-26: qty 20

## 2020-01-26 MED ORDER — NOREPINEPHRINE 16 MG/250ML-% IV SOLN
5.0000 ug/min | INTRAVENOUS | Status: DC
  Administered 2020-01-26: 5 ug/min via INTRAVENOUS
  Filled 2020-01-26 (×2): qty 250

## 2020-01-26 MED ORDER — METHYLPREDNISOLONE SODIUM SUCC 125 MG IJ SOLR
60.0000 mg | Freq: Four times a day (QID) | INTRAMUSCULAR | Status: DC
  Administered 2020-01-26 (×2): 60 mg via INTRAVENOUS
  Filled 2020-01-26 (×2): qty 2

## 2020-01-26 MED ORDER — SODIUM BICARBONATE 8.4 % IV SOLN: 50.0000 meq | Freq: Once | INTRAVENOUS | Status: DC

## 2020-01-26 MED ORDER — IPRATROPIUM-ALBUTEROL 0.5-2.5 (3) MG/3ML IN SOLN
3.0000 mL | RESPIRATORY_TRACT | Status: DC
  Administered 2020-01-26: 3 mL via RESPIRATORY_TRACT
  Filled 2020-01-26 (×2): qty 3

## 2020-01-26 MED ORDER — CHLORHEXIDINE GLUCONATE 0.12% ORAL RINSE (MEDLINE KIT): 15.0000 mL | Freq: Two times a day (BID) | OROMUCOSAL | Status: DC

## 2020-01-26 MED ORDER — CHLORHEXIDINE GLUCONATE CLOTH 2 % EX PADS
6.0000 | MEDICATED_PAD | Freq: Every day | CUTANEOUS | Status: DC
  Administered 2020-01-26: 6 via TOPICAL

## 2020-01-26 MED ORDER — SODIUM CHLORIDE 0.9 % IV SOLN: 250.0000 mL | INTRAVENOUS | Status: DC

## 2020-01-26 MED ORDER — SODIUM CHLORIDE 0.9 % IV BOLUS
500.0000 mL | Freq: Once | INTRAVENOUS | Status: AC
  Administered 2020-01-26: 500 mL via INTRAVENOUS

## 2020-01-26 MED ORDER — SODIUM CHLORIDE 0.9 % IV SOLN
1.0000 g | Freq: Once | INTRAVENOUS | Status: DC
  Filled 2020-01-26: qty 10

## 2020-01-26 MED ORDER — INSULIN ASPART 100 UNIT/ML ~~LOC~~ SOLN
0.0000 [IU] | SUBCUTANEOUS | Status: DC
  Administered 2020-01-26: 2 [IU] via SUBCUTANEOUS
  Administered 2020-01-26: 3 [IU] via SUBCUTANEOUS

## 2020-01-26 MED ORDER — NOREPINEPHRINE 4 MG/250ML-% IV SOLN
5.0000 ug/min | INTRAVENOUS | Status: DC
  Filled 2020-01-26: qty 250

## 2020-01-26 MED ORDER — FAMOTIDINE IN NACL 20-0.9 MG/50ML-% IV SOLN
20.0000 mg | INTRAVENOUS | Status: DC
  Administered 2020-01-26: 20 mg via INTRAVENOUS
  Filled 2020-01-26: qty 50

## 2020-01-26 MED ORDER — EPINEPHRINE 1 MG/10ML IJ SOSY: 0.1000 mg | PREFILLED_SYRINGE | Freq: Once | INTRAMUSCULAR | Status: DC

## 2020-01-26 MED ORDER — IOHEXOL 350 MG/ML SOLN
75.0000 mL | Freq: Once | INTRAVENOUS | Status: AC | PRN
  Administered 2020-01-26: 75 mL via INTRAVENOUS

## 2020-01-26 MED ORDER — SODIUM BICARBONATE 8.4 % IV SOLN: 100.0000 meq | Freq: Once | INTRAVENOUS | Status: DC

## 2020-01-26 MED ORDER — ENOXAPARIN SODIUM 40 MG/0.4ML ~~LOC~~ SOLN: 40.0000 mg | SUBCUTANEOUS | Status: DC

## 2020-01-26 MED ORDER — SODIUM BICARBONATE-DEXTROSE 150-5 MEQ/L-% IV SOLN
150.0000 meq | INTRAVENOUS | Status: DC
  Filled 2020-01-26: qty 1000

## 2020-01-26 MED ORDER — SODIUM CHLORIDE 0.9 % IV SOLN
500.0000 mg | INTRAVENOUS | Status: DC
  Filled 2020-01-26: qty 500

## 2020-01-26 MED ORDER — POLYETHYLENE GLYCOL 3350 17 G PO PACK: 17.0000 g | PACK | Freq: Every day | ORAL | Status: DC | PRN

## 2020-01-26 MED ORDER — VASOPRESSIN 20 UNIT/ML IV SOLN
0.0300 [IU]/min | INTRAVENOUS | Status: DC
  Administered 2020-01-26: 0.03 [IU]/min via INTRAVENOUS
  Filled 2020-01-26 (×2): qty 2

## 2020-01-26 MED ORDER — SODIUM CHLORIDE 0.9% FLUSH: 10.0000 mL | Freq: Two times a day (BID) | INTRAVENOUS | Status: DC

## 2020-01-26 MED ORDER — SODIUM CHLORIDE 0.9% FLUSH: 10.0000 mL | INTRAVENOUS | Status: DC | PRN

## 2020-01-26 MED ORDER — DOCUSATE SODIUM 100 MG PO CAPS: 100.0000 mg | ORAL_CAPSULE | Freq: Two times a day (BID) | ORAL | Status: DC | PRN

## 2020-01-26 MED ORDER — ORAL CARE MOUTH RINSE
15.0000 mL | OROMUCOSAL | Status: DC
  Administered 2020-01-26: 15 mL via OROMUCOSAL

## 2020-01-26 MED ORDER — INSULIN ASPART 100 UNIT/ML IV SOLN: 10.0000 [IU] | Freq: Once | INTRAVENOUS | Status: DC

## 2020-01-26 MED FILL — Medication: Qty: 1 | Status: AC

## 2020-01-30 LAB — CULTURE, BLOOD (ROUTINE X 2)
Culture: NO GROWTH
Culture: NO GROWTH
Special Requests: ADEQUATE
Special Requests: ADEQUATE

## 2020-02-21 NOTE — Progress Notes (Signed)
Patient transported from ED to CT and then to 5T73 without complications.

## 2020-02-21 NOTE — Progress Notes (Signed)
eLink Physician-Brief Progress Note Patient Name: Bruce Travis DOB: 10-17-1934 MRN: [id number]   Date of Service  2020-02-11  HPI/Events of Note  13 M combined HF (EF 45-50%), COPD (FEV1 1.62), on home O2 presented with somewhat cute shortness of breath and was hypoxic. He was placed on BiPap enroute but was urgently intubated int eh ED. Hypotensive post-intubation and eventually went into PEA arrest. Chest CT negative for PE but with multiple acute rib fractures. Noted to have avulsion of skin on left arm and hematoma left flank.  eICU Interventions  Repeating CBC and adding coags to see if with significant drop in H/H and coagulopathic will get abdominal CT to rule out retroperitoneal hematoma. Unclear if with history of fall. Wound care to address skin tear for now.     Intervention Category Major Interventions: Respiratory failure - evaluation and management;Hypotension - evaluation and management Intermediate Interventions: Other: Evaluation Type: New Patient Evaluation  Judd Lien Feb 11, 2020, 2:06 AM

## 2020-02-21 NOTE — H&P (Signed)
NAME:  Bruce Travis, MRN:  [id number], DOB:  1934/10/10, LOS: 0 ADMISSION DATE:  02/13/2020, CONSULTATION DATE: 01/28/20 REFERRING MD: Tomi Bamberger, CHIEF COMPLAINT: Acute on chronic respiratory failure  Brief History   85 year old white male with a history of very severe COPD worsening respiratory distress brought to the emergency room and resuscitated  History of present illness   Patient is an 85 year old male with a history of very severe COPD.  His wife says that he is on approximately 7 L of oxygen at home.  He was doing fairly well until this afternoon when he became acutely short of breath with oxygen saturations of 67%.  Ambulance was called he was placed on BiPAP with sats up to 80.  He was urgently intubated on arrival to the emergency room.  Once area developed worsening hypotension.  This apparently resulted in a PEA arrest.  On my evaluation the patient is on 30 mics of Levophed.  Initial chest x-ray shows blunting of the costophrenic angles bilaterally post intubation this is cleared however he does have some increased interstitial markings in both lung fields bilaterally.  Last echocardiogram done about 3 months ago shows ejection fraction of 40%.  Past Medical History   . Allergic rhinitis   . Anxiety   . Bronchiectasis   . CAD (coronary artery disease)   . Celiac disease   . CHF (congestive heart failure) ([place])   . Chronic heart failure ([place])   . Chronic kidney disease (CKD), stage III (moderate)   . COPD (chronic obstructive pulmonary disease) ([place])   . Diverticulosis of colon (without mention of hemorrhage)   . Esophageal reflux   . Family history of adverse reaction to anesthesia    daughter gets PONV  . Family hx of colon cancer   . History of stomach ulcers 1980s  . Hyperlipemia   . Hypertension   . 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 Myocardial infarction [facility])    "previous MI/echo in 12/2015"  . Nephrolithiasis     "got them now; never had OR/scopes" (02/03/2016)  . On home oxygen therapy    "3L-5L; 24/7" (12/29/2017)  . Other diseases of lung, not elsewhere classified   . Personal history of colonic polyps 04/26/2007   hyperplastic   . Pneumonia      Significant Hospital Events   NA  Consults:  PCCM  Procedures:  Intubation and central line in the emergency room  Significant Diagnostic Tests:  NA  Micro Data:  NA  Antimicrobials:  Empiric Rocephin and Zithromax  Interim history/subjective:  NA  Objective   Blood pressure 130/68, pulse 84, temperature (!) 84.3 F (29.1 C), resp. rate 18, height 5' 8"  (1.727 m), weight 65 kg, SpO2 96 %.    Vent Mode: PRVC FiO2 (%):  [100 %] 100 % Set Rate:  [16 bmp] 16 bmp Vt Set:  [500 mL-540 mL] 540 mL PEEP:  [8 cmH20-10 cmH20] 10 cmH20 Plateau Pressure:  [12 cmH20] 12 cmH20  No intake or output data in the 24 hours ending 01/28/2020 0016 Filed Weights   01/24/2020 2129  Weight: 65 kg    Examination: General: Thin elderly white male intubated and sedated HENT: Intubated otherwise within normal limits Lungs: Diminished breath sounds throughout barrel chest Cardiovascular: Regular rate and rhythm Abdomen: Soft benign scaphoid Extremities:: Diminished pulses in lower extremities Neuro: Sedated unresponsive GU: Within normal limits  Resolved Hospital Problem list   NA  Assessment & Plan:  1.  Respiratory failure: Patient has a history of what sounds like very severe COPD possible bronchiectasis.  O2 saturation on 100% is 91%.  Will treat as COPD exacerbation with Solu-Medrol Zithromax Rocephin and scheduled nebulized therapy.  2. Status post PEA arrest: Echocardiogram in the morning  3.  Hypotension: Started on Levophed and wean as tolerated.  4.  Bilateral pneumonia: We will cover with Rocephin and Zithromax.  Covid test negative  5.  Acute on chronic kidney injury.  Patient's prognosis is extremely poor.  This was discussed  antibiotics.  We will continue critical care management as outlined above  Best practice:  Diet: N.p.o. Pain/Anxiety/Delirium protocol (if indicated): Fentanyl VAP protocol (if indicated): Yes DVT prophylaxis: Lovenox SCDs GI prophylaxis: Pepcid Glucose control: Monitor Mobility: Bedrest Code Status: Full Family Communication: Discussed patient's dire situation with his wife Disposition: To ICU  Labs   CBC: Recent Labs  Lab 02/12/2020 2155 01/29/2020 2230  WBC 12.4*  --   NEUTROABS 9.8*  --   HGB 14.3 14.3  HCT 44.8 42.0  MCV 95.5  --   PLT 257  --     Basic Metabolic Panel: Recent Labs  Lab 02/07/2020 2155 01/23/2020 2230  NA 132* 133*  K 5.6* 4.7  CL 99  --   CO2 19*  --   GLUCOSE 132*  --   BUN 56*  --   CREATININE 1.80*  --   CALCIUM 9.0  --    GFR: Estimated Creatinine Clearance: 28.6 mL/min (A) (by C-G formula based on SCr of 1.8 mg/dL (H)). Recent Labs  Lab 01/22/2020 2155 01/24/2020 2200  WBC 12.4*  --   LATICACIDVEN  --  2.4*    Liver Function Tests: No results for input(s): AST, ALT, ALKPHOS, BILITOT, PROT, ALBUMIN in the last 168 hours. No results for input(s): LIPASE, AMYLASE in the last 168 hours. No results for input(s): AMMONIA in the last 168 hours.  ABG    Component Value Date/Time   PHART 7.265 (L) 01/23/2020 2230   PCO2ART 45.4 02/19/2020 2230   PO2ART 69 (L) 02/17/2020 2230   HCO3 20.6 01/29/2020 2230   TCO2 22 01/23/2020 2230   ACIDBASEDEF 6.0 (H) 02/09/2020 2230   O2SAT 91.0 02/11/2020 2230     Coagulation Profile: No results for input(s): INR, PROTIME in the last 168 hours.  Cardiac Enzymes: No results for input(s): CKTOTAL, CKMB, CKMBINDEX, TROPONINI in the last 168 hours.  HbA1C: Hgb A1c MFr Bld  Date/Time Value Ref Range Status  02/05/2016 07:10 AM 5.6 4.8 - 5.6 % Final    Comment:    (NOTE)         Pre-diabetes: 5.7 - 6.4         Diabetes: >6.4         Glycemic control for adults with diabetes: <7.0     CBG: No  results for input(s): GLUCAP in the last 168 hours.  Review of Systems:   Unable to obtain at this time patient is intubated and sedated  Past Medical History  He,  has a past medical history of Allergic rhinitis, Anxiety, Bronchiectasis, CAD (coronary artery disease), Celiac disease, CHF (congestive heart failure) (HCC), Chronic heart failure ([place]), Chronic kidney disease (CKD), stage III (moderate), COPD (chronic obstructive pulmonary disease) ([place]), Diverticulosis of colon (without mention of hemorrhage), Esophageal reflux, Family history of adverse reaction to anesthesia, Family hx of colon cancer, History of stomach ulcers (1980s), Hyperlipemia, Hypertension, Laryngeal cancer ([place]), Myocardial infarction ([place]), Nephrolithiasis, On home  oxygen therapy, Other diseases of lung, not elsewhere classified, Personal history of colonic polyps (04/26/2007), and Pneumonia ("several times").   Surgical History    Past Surgical History:  Procedure Laterality Date  . CARDIAC CATHETERIZATION  02/03/2016  . CARDIAC CATHETERIZATION  09/30/2009   non-obstructive CAD w/30% narrowing in prox LAD (Dr. Waunita Schooner)  . CARDIAC CATHETERIZATION  05/04/2001   same at 2011 cath (Dr. Waunita Schooner)  . CARDIAC CATHETERIZATION N/A 02/03/2016   Procedure: Right/Left Heart Cath and Coronary Angiography;  Surgeon: Pixie Casino, MD;  Location: [facility];  Service: Cardiovascular;  Laterality: N/A;  . EPIGLOTOPLASTY W/ MLB     removal due to carcinoma  . EXCISIONAL HEMORRHOIDECTOMY  2000s  . HAND SURGERY Right 1958   d/t crush injury  . TONSILLECTOMY AND ADENOIDECTOMY    . ULTRASOUND GUIDANCE FOR VASCULAR ACCESS  02/03/2016   Procedure: Ultrasound Guidance For Vascular Access;  Surgeon: Pixie Casino, MD;  Location: [facility];  Service: Cardiovascular;;  . VASECTOMY       Social History   reports that he quit smoking about 31 years ago. His smoking use included cigarettes. He has a 70.00 pack-year  smoking history. He quit smokeless tobacco use about 18 years ago.  His smokeless tobacco use included chew. He reports that he does not drink alcohol or use drugs.   Family History   His family history includes Cancer in his paternal grandmother; Colon cancer in his father; Endometrial cancer in his sister; Heart disease in his father; Prostate cancer in his father; Stroke in his maternal grandmother and mother.   Allergies Allergies  Allergen Reactions  . Codeine Other (See Comments)    Thought head was going to blow off/ severe headache  . Gluten Meal Other (See Comments)    Celiac disease  . Adhesive [Tape] Other (See Comments)    Band-aids - tears skin off     Home Medications  Prior to Admission medications   Medication Sig Start Date End Date Taking? Authorizing Provider  acetaminophen (TYLENOL) 325 MG tablet Take 2 tablets (650 mg total) by mouth every 6 (six) hours as needed for fever. 10/17/18   Debbe Odea, MD  albuterol (PROVENTIL) (2.5 MG/3ML) 0.083% nebulizer solution INHALE 1 VIAL VIA NEBULIZER EVERY 4 HOURS AS NEEDED FOR WHEEZING OR SHORTNESS OF BREATH. Patient taking differently: Take 2.5 mg by nebulization every 4 (four) hours as needed for wheezing or shortness of breath.  10/23/19   Susy Frizzle, MD  albuterol (VENTOLIN HFA) 108 (90 Base) MCG/ACT inhaler INHALE 2 PUFFS EVERY 4 HOURS AS NEEDED FOR SHORTNESS OF BREATH. Patient taking differently: Inhale 2 puffs into the lungs every 4 (four) hours as needed for wheezing or shortness of breath.  10/16/19   Susy Frizzle, MD  ALPRAZolam (XANAX) 0.25 MG tablet TAKE ONE TABLET BY MOUTH TID AS NEEDED FOR ANXIETY. Patient taking differently: Take 0.25 mg by mouth 3 (three) times daily as needed for anxiety.  12/27/19   Susy Frizzle, MD  amiodarone (PACERONE) 200 MG tablet TAKE 1/2 TABLET BY MOUTH ONCE DAILY. Patient taking differently: Take 100 mg by mouth daily.  12/19/19   Bensimhon, Shaune Pascal, MD  aspirin EC 81 MG  tablet Take 81 mg by mouth daily.    [provider]  atorvastatin (LIPITOR) 20 MG tablet TAKE ONE TABLET BY MOUTH ONCE DAILY. 11/30/19   Bensimhon, Shaune Pascal, MD  azelastine (ASTELIN) 0.1 % nasal spray USE 2  SPRAYS IN EACH NOSTRILS TWICE DAILY AS DIRECTED. Patient taking differently: Place 2 sprays into both nostrils 2 (two) times daily.  12/03/19   Susy Frizzle, MD  calcitonin, salmon, (MIACALCIN/FORTICAL) 200 UNIT/ACT nasal spray Place 1 spray into alternate nostrils daily. Every other day 10/31/18   Gerlene Fee, NP  Calcium Carbonate-Vitamin D (CALTRATE 600+D) 600-400 MG-UNIT tablet Take 1 tablet by mouth daily.     [provider]  dextromethorphan-guaiFENesin (MUCINEX DM) 30-600 MG 12hr tablet Take 1 tablet by mouth 2 (two) times daily.    [provider]  ENTRESTO 49-51 MG TAKE (1) TABLET BY MOUTH TWICE DAILY. Patient taking differently: Take 1 tablet by mouth 2 (two) times daily.  12/19/19   Bensimhon, Shaune Pascal, MD  fluticasone (FLONASE) 50 MCG/ACT nasal spray Place 1-2 sprays into both nostrils at bedtime. 09/25/19   Martyn Ehrich, NP  Fluticasone-Umeclidin-Vilant (TRELEGY ELLIPTA) 100-62.5-25 MCG/INH AEPB Inhale 1 puff into the lungs daily. 09/25/19   Martyn Ehrich, NP  HYDROcodone-acetaminophen (NORCO/VICODIN) 5-325 MG tablet Take 1 tablet by mouth every 6 (six) hours as needed for moderate pain. 02/21/19   Alycia Rossetti, MD  LUTEIN PO Take 1 capsule by mouth daily.    [provider]  meclizine (ANTIVERT) 25 MG tablet TAKE (1) TABLET BY MOUTH THREE TIMES DAILY AS NEEDED FOR DIZZINESS. Patient taking differently: Take 25 mg by mouth 3 (three) times daily as needed for dizziness.  11/01/19   Susy Frizzle, MD  montelukast (SINGULAIR) 10 MG tablet TAKE 1 TABLET BY MOUTH AT BEDTIME. Patient taking differently: Take 10 mg by mouth at bedtime.  05/29/19   Susy Frizzle, MD  Multiple Vitamins-Minerals (CENTRUM SILVER 50+MEN) TABS Take 1  tablet by mouth daily.    [provider]  NON FORMULARY Diet Type:  Gluten Free 10/18/18   [provider]  OXYGEN Inhale 4-6 L/min into the lungs continuous. To maintain sats > 88% 10/30/18   [provider]  pantoprazole (PROTONIX) 40 MG tablet TAKE ONE TABLET BY MOUTH DAILY. Patient taking differently: Take 40 mg by mouth daily.  12/20/19   Susy Frizzle, MD  polyethylene glycol (MIRALAX / Floria Raveling) packet Take 17 g by mouth daily as needed for mild constipation. Mix in 8 oz liquid and drink    [provider]  predniSONE (DELTASONE) 5 MG tablet TAKE 1 TABLET BY MOUTH DAILY WITH BREAKFAST. Patient taking differently: Take 5 mg by mouth daily with breakfast.  11/27/19   Susy Frizzle, MD  spironolactone (ALDACTONE) 25 MG tablet TAKE (1/2) TABLET BY MOUTH DAILY. Patient taking differently: Take 12.5 mg by mouth daily.  12/21/19   Bensimhon, Shaune Pascal, MD  tamsulosin (FLOMAX) 0.4 MG CAPS capsule TAKE ONE CAPSULE BY MOUTH ONCE DAILY. Patient taking differently: Take 0.4 mg by mouth daily.  05/29/19   Susy Frizzle, MD     Critical care time: Over 35 minutes was spent bedside evaluation chart review and critical care planning.

## 2020-02-21 NOTE — Progress Notes (Addendum)
Cuff pressure 70s/40s with 50 mg norepinephrine. O2 sats low; 80s at FiO2 100%. [facility] notified and ordered 500 mL NS bolus and vasopressin  Marv Alfrey (wife) and daughter [name] updated about patient's status. Both state they will be in today to visit.

## 2020-02-21 NOTE — Discharge Summary (Signed)
Physician Discharge Summary  Patient ID: Bruce Travis MRN: [id number] DOB/AGE: 1935/08/08 85 y.o.  Admit date: 01/23/2020 Discharge date: 2020/02/17  Admission Diagnoses: Acute respiratory failure with hypoxia and hypercapnia Status post PEA cardiac arrest Shock Bilateral pneumonia AKI with CKD Hyperglycemia Prediabetes  Discharge Diagnoses:  Active Problems:   Respiratory arrest ([place])   Respiratory failure (HCC) Acute respiratory failure with hypoxia and hypercapnia Status post PEA cardiac arrest Shock Bilateral pneumonia AKI with CKD Hyperglycemia Prediabetes  Discharged Condition: deceased  Hospital Course:  Bruce Travis was admitted to the ICU following cardiopulmonary arrest due to acute COPD exacerbation and pneumonia.  Due to severe persistent respiratory failure he developed a second cardiac arrest several hours later, and his hemodynamics were not able to recover.  Following his second cardiac arrest his saturations remained in the 60s at the highest, and deteriorated quickly.  His family was called in to discuss his wishes regarding care at this stage of his life.  Rapid bedside assessment raise concern for possible bilateral pneumothoraxes, but based on CT scan he has areas of pleuroparenchymal scarring, likely from previous infections and COPD exacerbations.  Confirmatory chest x-ray was not able to be completed before his family, who were in the hospital during this rapid deterioration were updated, and they indicated that he would not want aggressive measures unless there was a fair amount of certainty that he would have a full recovery.  His wife indicated that he had previously been DNR and she was unsure when this changed.  His son Bruce Travis, who is a family physician in [place], was updated over the phone at his sister's request. He felt that additional care including chest tubes would not be consistent with his father's wishes.  His CODE STATUS was changed to DNR and he account  of bradycardic PEA arrest.  His wife and daughter were at bedside at this time.  Consults: PCCM  Significant Diagnostic Studies: CTA chest- no PE, multilobar bilateral pneumonia  Treatments: Antibiotics, IV fluids, vasopressors, pain medications, mechanical ventilation  Disposition: Funeral home of the family's choosing     Signed: Julian Travis 2020-02-17, 7:47 AM

## 2020-02-21 NOTE — Progress Notes (Signed)
Critical abg called to [facility] at 0415, asked them to call if changes needed to be made.

## 2020-02-21 NOTE — Progress Notes (Signed)
eLink Physician-Brief Progress Note Patient Name: Bruce Travis DOB: [birth year] MRN: [id number]   Date of Service  Feb 23, 2020  HPI/Events of Note  Patient became bradycardic and then asystole. ROSC achieved after 2 epi and 1 bicarb. Vasopressin ordered earlier not yet started. NS bolus was given.  eICU Interventions  Critical care team at bedside Labs to be drawn Family to be updated     Intervention Category Major Interventions: Code management / supervision  Judd Lien 2020/02/23, 6:35 AM

## 2020-02-21 NOTE — Plan of Care (Addendum)
Patient with hypotension, worsening hypoxia. Absent lung slide on the left, absent anteriorly on the right. Diminished breath sounds bilaterally. Daughter and wife at bedside. CXR ordered STAT. Family want to call their son/ brother to determine if they wish to proceed with chest tube placement. They understand that if there is not an easily reversible cause of his hypoxia, additional CPR and resuscitation would likely be unhelpful in the event of another cardiac arrest.  This patient is critically ill with multiple organ system failure which requires frequent high complexity decision making, assessment, support, evaluation, and titration of therapies. This was completed through the application of advanced monitoring technologies and extensive interpretation of multiple databases. During this encounter critical care time was devoted to patient care services described in this note for 20 minutes.   Julian Hy, DO 02-21-2020 7:29 AM [facility]   Family at bedside and discussed with Ruthann Cancer, who is a family medicine physician, Mr. Saleeby's son. He indicated that his father would not want heroic measures and would not want aggressive care unless there was a reasonable chance at recovery. His wife indicated that he had been DNR at some point. The family decided to change code status to DNR. Patient went into bradycardic arrest.   Time of death: 07:35  Additional 15 min CC time.  Julian Hy, DO February 21, 2020 7:44 AM  Pulmonary & Critical Care

## 2020-02-21 NOTE — Progress Notes (Signed)
PCCM interval progress note:  Paged that patient had a second code blue, hypotension was worsening so vaso ordered but not started.  Became bradycardic and had PEA arrest, one round of chest compressions, epi and bicarb and ROSC achieved.   Istat VBG with pH of 6.9 and CO2>90 with K 6.9.  Given additional amp of bicarb and bicarb gtt started, insulin and calcium ordered and respiratory rate increased from 12 to 22.  Family notified by RN and en route to the hospital.    Otilio Carpen Asberry [place], PA-C

## 2020-02-21 NOTE — Progress Notes (Signed)
CDS referral completed.

## 2020-02-21 NOTE — Progress Notes (Signed)
Chaplain engaged in initial visit with Bruce Travis and offered support.

## 2020-02-21 NOTE — ED Notes (Signed)
Transported to CT scan before going to ICU .

## 2020-02-21 DEATH — deceased
